# Patient Record
Sex: Female | Born: 1965 | Race: Black or African American | Hispanic: No | Smoking: Former smoker
Health system: Southern US, Community
[De-identification: ages and names within clinical notes are randomized; demographics above are authoritative.]

## PROBLEM LIST (undated history)

## (undated) ENCOUNTER — Inpatient Hospital Stay (HOSPITAL_COMMUNITY): Payer: Self-pay

## (undated) DIAGNOSIS — F329 Major depressive disorder, single episode, unspecified: Secondary | ICD-10-CM

## (undated) DIAGNOSIS — M545 Low back pain, unspecified: Secondary | ICD-10-CM

## (undated) DIAGNOSIS — K5792 Diverticulitis of intestine, part unspecified, without perforation or abscess without bleeding: Secondary | ICD-10-CM

## (undated) DIAGNOSIS — F419 Anxiety disorder, unspecified: Secondary | ICD-10-CM

## (undated) DIAGNOSIS — R569 Unspecified convulsions: Secondary | ICD-10-CM

## (undated) DIAGNOSIS — IMO0002 Reserved for concepts with insufficient information to code with codable children: Secondary | ICD-10-CM

## (undated) DIAGNOSIS — Z5189 Encounter for other specified aftercare: Secondary | ICD-10-CM

## (undated) DIAGNOSIS — E119 Type 2 diabetes mellitus without complications: Secondary | ICD-10-CM

## (undated) DIAGNOSIS — R87619 Unspecified abnormal cytological findings in specimens from cervix uteri: Secondary | ICD-10-CM

## (undated) DIAGNOSIS — K219 Gastro-esophageal reflux disease without esophagitis: Secondary | ICD-10-CM

## (undated) DIAGNOSIS — M719 Bursopathy, unspecified: Secondary | ICD-10-CM

## (undated) DIAGNOSIS — M5136 Other intervertebral disc degeneration, lumbar region: Secondary | ICD-10-CM

## (undated) DIAGNOSIS — R600 Localized edema: Secondary | ICD-10-CM

## (undated) DIAGNOSIS — M1990 Unspecified osteoarthritis, unspecified site: Secondary | ICD-10-CM

## (undated) DIAGNOSIS — IMO0001 Reserved for inherently not codable concepts without codable children: Secondary | ICD-10-CM

## (undated) DIAGNOSIS — Z931 Gastrostomy status: Secondary | ICD-10-CM

## (undated) DIAGNOSIS — F319 Bipolar disorder, unspecified: Secondary | ICD-10-CM

## (undated) DIAGNOSIS — M797 Fibromyalgia: Secondary | ICD-10-CM

## (undated) DIAGNOSIS — M5126 Other intervertebral disc displacement, lumbar region: Secondary | ICD-10-CM

## (undated) DIAGNOSIS — E739 Lactose intolerance, unspecified: Secondary | ICD-10-CM

## (undated) DIAGNOSIS — F32A Depression, unspecified: Secondary | ICD-10-CM

## (undated) DIAGNOSIS — G43909 Migraine, unspecified, not intractable, without status migrainosus: Secondary | ICD-10-CM

## (undated) DIAGNOSIS — I10 Essential (primary) hypertension: Secondary | ICD-10-CM

## (undated) DIAGNOSIS — D649 Anemia, unspecified: Secondary | ICD-10-CM

## (undated) DIAGNOSIS — E162 Hypoglycemia, unspecified: Secondary | ICD-10-CM

## (undated) DIAGNOSIS — G8929 Other chronic pain: Secondary | ICD-10-CM

## (undated) DIAGNOSIS — F1010 Alcohol abuse, uncomplicated: Secondary | ICD-10-CM

## (undated) DIAGNOSIS — M51369 Other intervertebral disc degeneration, lumbar region without mention of lumbar back pain or lower extremity pain: Secondary | ICD-10-CM

## (undated) HISTORY — DX: Essential (primary) hypertension: I10

## (undated) HISTORY — PX: DILATION AND CURETTAGE OF UTERUS: SHX78

## (undated) HISTORY — PX: CERVICAL CONE BIOPSY: SUR198

## (undated) HISTORY — DX: Encounter for other specified aftercare: Z51.89

## (undated) HISTORY — DX: Unspecified convulsions: R56.9

---

## 2005-04-26 HISTORY — PX: ROUX-EN-Y GASTRIC BYPASS: SHX1104

## 2005-12-18 ENCOUNTER — Emergency Department (HOSPITAL_COMMUNITY): Admission: EM | Admit: 2005-12-18 | Discharge: 2005-12-18 | Payer: Self-pay | Admitting: Emergency Medicine

## 2007-01-18 ENCOUNTER — Emergency Department (HOSPITAL_COMMUNITY): Admission: EM | Admit: 2007-01-18 | Discharge: 2007-01-18 | Payer: Self-pay | Admitting: Emergency Medicine

## 2007-01-22 ENCOUNTER — Inpatient Hospital Stay (HOSPITAL_COMMUNITY): Admission: EM | Admit: 2007-01-22 | Discharge: 2007-01-27 | Payer: Self-pay | Admitting: Emergency Medicine

## 2007-02-03 ENCOUNTER — Ambulatory Visit: Payer: Self-pay | Admitting: Gastroenterology

## 2007-02-14 ENCOUNTER — Ambulatory Visit: Payer: Self-pay | Admitting: Gastroenterology

## 2007-02-21 ENCOUNTER — Ambulatory Visit: Payer: Self-pay | Admitting: Gastroenterology

## 2007-03-28 ENCOUNTER — Emergency Department (HOSPITAL_COMMUNITY): Admission: EM | Admit: 2007-03-28 | Discharge: 2007-03-29 | Payer: Self-pay | Admitting: Emergency Medicine

## 2008-11-07 ENCOUNTER — Emergency Department (HOSPITAL_COMMUNITY): Admission: EM | Admit: 2008-11-07 | Discharge: 2008-11-07 | Payer: Self-pay | Admitting: Emergency Medicine

## 2009-12-30 ENCOUNTER — Emergency Department (HOSPITAL_COMMUNITY): Admission: EM | Admit: 2009-12-30 | Discharge: 2009-12-30 | Payer: Self-pay | Admitting: Family Medicine

## 2009-12-30 ENCOUNTER — Emergency Department (HOSPITAL_COMMUNITY): Admission: EM | Admit: 2009-12-30 | Discharge: 2009-12-30 | Payer: Self-pay | Admitting: Emergency Medicine

## 2010-02-09 ENCOUNTER — Emergency Department (HOSPITAL_COMMUNITY): Admission: EM | Admit: 2010-02-09 | Discharge: 2010-02-09 | Payer: Self-pay | Admitting: Emergency Medicine

## 2010-02-17 ENCOUNTER — Encounter: Admission: RE | Admit: 2010-02-17 | Discharge: 2010-02-17 | Payer: Self-pay | Admitting: Internal Medicine

## 2010-07-09 LAB — POCT PREGNANCY, URINE: Preg Test, Ur: NEGATIVE

## 2010-09-05 ENCOUNTER — Emergency Department (HOSPITAL_COMMUNITY)
Admission: EM | Admit: 2010-09-05 | Discharge: 2010-09-05 | Disposition: A | Discharge status: Home / Self Care | Payer: Medicare Other | Attending: Emergency Medicine | Admitting: Emergency Medicine

## 2010-09-05 ENCOUNTER — Emergency Department (HOSPITAL_COMMUNITY): Payer: Medicare Other

## 2010-09-05 DIAGNOSIS — R509 Fever, unspecified: Secondary | ICD-10-CM | POA: Insufficient documentation

## 2010-09-05 DIAGNOSIS — E119 Type 2 diabetes mellitus without complications: Secondary | ICD-10-CM | POA: Insufficient documentation

## 2010-09-05 DIAGNOSIS — R229 Localized swelling, mass and lump, unspecified: Secondary | ICD-10-CM | POA: Insufficient documentation

## 2010-09-05 DIAGNOSIS — I10 Essential (primary) hypertension: Secondary | ICD-10-CM | POA: Insufficient documentation

## 2010-09-05 DIAGNOSIS — R059 Cough, unspecified: Secondary | ICD-10-CM | POA: Insufficient documentation

## 2010-09-05 DIAGNOSIS — J3489 Other specified disorders of nose and nasal sinuses: Secondary | ICD-10-CM | POA: Insufficient documentation

## 2010-09-05 DIAGNOSIS — IMO0001 Reserved for inherently not codable concepts without codable children: Secondary | ICD-10-CM | POA: Insufficient documentation

## 2010-09-05 DIAGNOSIS — K1120 Sialoadenitis, unspecified: Secondary | ICD-10-CM | POA: Insufficient documentation

## 2010-09-05 DIAGNOSIS — R05 Cough: Secondary | ICD-10-CM | POA: Insufficient documentation

## 2010-09-05 LAB — CBC
HCT: 26.9 % — ABNORMAL LOW (ref 36.0–46.0)
Hemoglobin: 9.5 g/dL — ABNORMAL LOW (ref 12.0–15.0)
MCH: 31.3 pg (ref 26.0–34.0)
MCHC: 35.3 g/dL (ref 30.0–36.0)
MCV: 88.5 fL (ref 78.0–100.0)
Platelets: 306 10*3/uL (ref 150–400)
RBC: 3.04 MIL/uL — ABNORMAL LOW (ref 3.87–5.11)
RDW: 12.6 % (ref 11.5–15.5)
WBC: 8.5 10*3/uL (ref 4.0–10.5)

## 2010-09-05 LAB — DIFFERENTIAL
Basophils Absolute: 0 10*3/uL (ref 0.0–0.1)
Basophils Relative: 1 % (ref 0–1)
Eosinophils Absolute: 0 10*3/uL (ref 0.0–0.7)
Eosinophils Relative: 0 % (ref 0–5)
Lymphocytes Relative: 12 % (ref 12–46)
Lymphs Abs: 1 10*3/uL (ref 0.7–4.0)
Monocytes Absolute: 0.8 10*3/uL (ref 0.1–1.0)
Monocytes Relative: 10 % (ref 3–12)
Neutro Abs: 6.6 10*3/uL (ref 1.7–7.7)
Neutrophils Relative %: 78 % — ABNORMAL HIGH (ref 43–77)

## 2010-09-05 LAB — POCT I-STAT, CHEM 8
BUN: 11 mg/dL (ref 6–23)
Calcium, Ion: 1.11 mmol/L — ABNORMAL LOW (ref 1.12–1.32)
Chloride: 105 meq/L (ref 96–112)
Creatinine, Ser: 0.7 mg/dL (ref 0.4–1.2)
Glucose, Bld: 93 mg/dL (ref 70–99)
HCT: 28 % — ABNORMAL LOW (ref 36.0–46.0)
Hemoglobin: 9.5 g/dL — ABNORMAL LOW (ref 12.0–15.0)
Potassium: 4.9 mEq/L (ref 3.5–5.1)
Sodium: 137 mEq/L (ref 135–145)
TCO2: 26 mmol/L (ref 0–100)

## 2010-09-05 MED ORDER — IOHEXOL 300 MG/ML  SOLN: 75.0000 mL | Freq: Once | INTRAMUSCULAR | Status: DC | PRN

## 2010-09-08 NOTE — H&P (Signed)
NAME:  Angie [name], Angie [name]               ACCOUNT NO.:  [id number]   MEDICAL RECORD NO.:  [id number]          PATIENT TYPE:  INP   LOCATION:  3734                         FACILITY:  [facility]   PHYSICIAN:  Della Goo, M.D. DATE OF BIRTH:  06-Jan-1966   DATE OF ADMISSION:  01/22/2007  DATE OF DISCHARGE:                              HISTORY & PHYSICAL   PRIMARY CARE PHYSICIAN:  Dr. Lonia Blood.   CHIEF COMPLAINT:  Chest pain.   HISTORY OF PRESENT ILLNESS:  This is a 45 year old female presenting to  the emergency department with complaints of chest pain which is  substernal, rated at a 5/10, which she describes as being chest  pain/pressure and heaviness which radiates into the left shoulder.  She  reports having this pain off and on for 3 days.  When it first started,  she did present to the emergency department, however, did leave AMA at  that time.  She states that she felt the pain would possibly just go  away.  She does report the pain is associated with shortness of breath.  She reports having worsening shortness of breath and dyspnea on exertion  today.  She denies having any nausea, vomiting or diaphoresis associated  with the pain.  She denies having any fevers, chills or cough.   PAST MEDICAL HISTORY:  1. Type 2 diabetes mellitus.  2. Hypertension.  3. Anemia.  4. Diverticulosis.  5. History of cervical spine injury.  6. Right wrist and right hand pain.   PAST SURGICAL HISTORY:  Significant for C-section x3.   MEDICATIONS:  Metformin, glipizide, Actos, lisinopril, aspirin, Valium,  hydrocodone.   ALLERGIES:  She has no known drug allergies.   SOCIAL HISTORY:  The patient is married with children, nonsmoker,  nondrinker.   FAMILY HISTORY:  Positive for coronary artery disease in her father and  paternal family.  Positive for hypertension in her father, paternal  grandmother and a sister.  Positive for diabetes mellitus in her  paternal grandmother and sister and  positive for cancer in her paternal  family.   REVIEW OF SYSTEMS:  Pertinent are mentioned above.  The patient does  deny having any syncope or dizziness associated with the chest  discomfort and pain.   PHYSICAL EXAMINATION:  GENERAL:  This is an overweight 45 year old  female in discomfort, but in no acute distress.  VITAL SIGNS:  Temperature 98.9, blood pressure 128/81, heart rate 89-92,  respirations 16-20, O2 saturations 98% to 100%.  HEENT:  Normocephalic,  atraumatic.  Pupils equally round and reactive to light.  Extraocular  muscles are intact.  Funduscopic benign.  Oropharynx is clear.  NECK:  Supple with full range of motion.  No thyromegaly, adenopathy or  jugulovenous distention.  CARDIOVASCULAR:  Regular rate and rhythm.  No murmurs, gallops or rubs.  LUNGS:  Clear to auscultation bilaterally.  ABDOMEN:  Positive bowel sounds, soft, nontender and nondistended.  EXTREMITIES:  Without cyanosis, clubbing or edema.  NEUROLOGIC:  Alert and oriented x3.  There are no focal deficits.   LABORATORY STUDIES:  White blood cell count 10.3, hemoglobin 11,  hematocrit 33.1, MCV 78.8, platelets 409,000; neutrophils 71%,  lymphocytes 20%.  Sodium 133, potassium 3.6, chloride 103, bicarb 18.4,  BUN 9, creatinine 0.5 and glucose 275.  Myoglobin 40.9, CK-MB less than  1, troponin less than 0.05.  Urinalysis negative.  D-dimer less than  0.22.   ASSESSMENT:  [age]-year-old female being admitted with:  1. Substernal chest pain.  2. Hyperglycemia with type 2 diabetes mellitus.  3. Mild anemia.  4. Hypertension.   PLAN:  The patient will be admitted to the telemetry area for cardiac  monitoring and cardiac enzymes will be performed.  Her regular  medications will be verified since she does not know the dosage at this  time.  The metformin therapy will be placed on hold for now.  Sliding-  scale insulin coverage has also been ordered for elevated blood sugars  p.r.n.  The patient  will be placed on Nitrol paste, oxygen therapy and  aspirin therapy.  DVT and GI prophylaxis have also been ordered.      Della Goo, M.D.  Electronically Signed     HJ/MEDQ  D:  01/23/2007  T:  01/23/2007  Job:  [id number]   cc:   Lonia Blood, M.D.

## 2010-09-08 NOTE — Discharge Summary (Signed)
NAME:  Angie [name], Angie [name]               ACCOUNT NO.:  [id number]   MEDICAL RECORD NO.:  [id number]          PATIENT TYPE:  INP   LOCATION:  3734                         FACILITY:  [facility]   PHYSICIAN:  Isidor Holts, M.D.  DATE OF BIRTH:  1965/07/20   DATE OF ADMISSION:  01/22/2007  DATE OF DISCHARGE:  01/27/2007                               DISCHARGE SUMMARY   PRIMARY CARE PHYSICIAN:  Lonia Blood, M.D.   DISCHARGE DIAGNOSES:  1. Chest pain, status post negative stress Myoview 01/26/2007, likely      noncardiac.  2. Hypertension.  3. Type 2 diabetes mellitus.  4. Morbid obesity.  5. Dyslipidemia.  6. Iron-deficiency anemia.  7. History of diverticulosis.  8. History of cervical spine injury.   DISCHARGE MEDICATIONS:  1. Metformin 500 mg daily.  2. Glipizide 5 mg p.o. b.i.d.  3. Lantus 45 units subcutaneously q.h.s. (was previously on 20 units      subcutaneously q.h.s.).  4. Aspirin enteric coated 81 mg p.o. daily.  5. Vicodin 5/500, one to two pills p.o. p.r.n. q.4h.  6. NuIron 150 p.o. daily.   PROCEDURES:  1. Chest x-ray dated 01/21/2007.  This showed no acute abnormality.  2. Radial pharmaceutical stress testing, adenosine Myoview dated      01/26/2007.  This showed no evidence of myocardial ischemia or      infarction.  Normal left ventricular wall motion, QGS ejection      fraction 52%.   CONSULTATIONS:  1. Dr. Noel Gerold, gastroenterologist.  2. Dr. Claudette Head, gastroenterologist.  3. Dr. Tresa Endo, cardiologist.   ADMISSION HISTORY:  As in H&P note of 01/22/2007, dictated by Dr.  Della Goo. However, in brief, this is a 45 year old female, with  known history of type 2 diabetes mellitus, morbid obesity,  diverticulosis, chronic anemia, hypertension, previous history of  cervical spine injury who presents with substernal chest pain radiating  to the left shoulder, recurrent for approximately 3 days.  She was  admitted for further evaluation, investigation and  management.   CLINICAL COURSE:  1. Chest pain.  Certainly the patient has risk factors for coronary      artery disease, including hypertension, type 2 diabetes mellitus,      morbid obesity. Cardiac enzyme were cycled, remained elevated, and      12-lead EKG showed no acute ischemic changes. Cardiology      consultation was kindly provided by Dr. Tresa Endo and the patient      underwent stress Myoview on 01/26/2079 which showed no evidence of      myocardial ischemia or infarction.  The patient has been reassured      accordingly. Likely chest pain is noncardiac in origin.  She has,      however, been recommended low-dose Aspirin, given her risk factors.   1. Hypertension.  The patient has a history of hypertension, however,      pre-admission, was not on any antihypertensive medications.  Be      that as it may, she remained normotensive throughout the course of      her hospitalization.   1. Iron-deficiency anemia.  The patient has a known history of chronic      anemia.  Iron studies showed the following findings:  Iron 27 TIBC      389, percent saturation 7, B12 was 485, folate was 10.6.  As of      01/26/2007 hemoglobin was 9.4, hematocrit was 28.4, MCV was 79.8.      The patient clearly has iron-deficiency anemia. Fecal occult      testing was also positive.  GI consultation was called, which was      kindly provided Drs. Cohen and Claudette Head, who have recommended      further GI evaluation with outpatient colonoscopy.  This will be      arranged upon discharge.  Note: The patient during her      hospitalization, had 1 episode of diarrheal stool with      hematochezia.  This was deemed likely secondary to diverticulosis      or possibly hemorrhoids.  This episode was transient, responded to      MiraLax, did not recur, as noted above. Lower GI endoscopic      evaluation is planned.   1. Type 2 diabetes mellitus.  This was managed with a combination of      carbohydrate modified  diet, scheduled Lantus insulin, Glipizide and      Metformin.  The patient's Lantus dosage has been up-titrated during      the course of her hospitalization.  She is being discharged on 45      units subcutaneously q.h.s.  We shall defer further management and      titration of diabetic medications,  to the patient`s primary MD on      routine followup.  Of note, the patient's hemoglobin A1c was      elevated at 10.1.   1. Morbid obesity.  The patient's thyroid profile was done.  TSH was      normal at 3.465.  lipid profile showed the following findings:      Total cholesterol 116, triglycerides 203, HDL 28, LDL 47.   1. Known history of cervical spine injury.  The patient was on prn      analgesics for this, however, this did not prove problematic during      the course of her hospitalization.   DISPOSITION:  The patient was considered sufficiently stable to be  discharged on 01/27/2007.  She is recommended to return to regular duties  on 01/30/2007.   DIET:  Heart healthy, carbohydrate modified.   ACTIVITY:  As tolerated.   FOLLOWUP:  The patient is to follow up with Dr. Mikeal Hawthorne, her primary MD  per prior scheduled appointment.  She is also to follow up with Dr.  Claudette Head, gastroenterologist to arrange outpatient colonoscopy, at  the date to be determined. She has been supplied with appropriate  information.      Isidor Holts, M.D.  Electronically Signed     CO/MEDQ  D:  01/27/2007  T:  01/27/2007  Job:  [id number]   cc:   Lonia Blood, M.D.  Venita Lick. Russella Dar, MD, Andi Hence, M.D.

## 2010-09-30 ENCOUNTER — Encounter: Payer: Self-pay | Admitting: Advanced Practice Midwife

## 2011-02-01 LAB — CBC
HCT: 29.2 — ABNORMAL LOW
Hemoglobin: 9.8 — ABNORMAL LOW
MCHC: 33.7
MCV: 78.8
Platelets: 414 — ABNORMAL HIGH
RBC: 3.71 — ABNORMAL LOW
RDW: 14.6
WBC: 10.9 — ABNORMAL HIGH

## 2011-02-01 LAB — DIFFERENTIAL
Basophils Absolute: 0
Basophils Relative: 0
Eosinophils Absolute: 0.1 — ABNORMAL LOW
Eosinophils Relative: 1
Lymphocytes Relative: 12
Lymphs Abs: 1.3
Monocytes Absolute: 1
Monocytes Relative: 9
Neutro Abs: 8.5 — ABNORMAL HIGH
Neutrophils Relative %: 78 — ABNORMAL HIGH

## 2011-02-01 LAB — POCT I-STAT CREATININE
Creatinine, Ser: 0.5
Operator id: 257131

## 2011-02-01 LAB — I-STAT 8, (EC8 V) (CONVERTED LAB)
Acid-base deficit: 4 — ABNORMAL HIGH
BUN: 4 — ABNORMAL LOW
Bicarbonate: 20.2
Chloride: 105
Glucose, Bld: 204 — ABNORMAL HIGH
HCT: 34 — ABNORMAL LOW
Hemoglobin: 11.6 — ABNORMAL LOW
Operator id: 257131
Potassium: 4
Sodium: 136
TCO2: 21
pCO2, Ven: 31.9 — ABNORMAL LOW
pH, Ven: 7.41 — ABNORMAL HIGH

## 2011-02-01 LAB — URINALYSIS, ROUTINE W REFLEX MICROSCOPIC
Bilirubin Urine: NEGATIVE
Glucose, UA: NEGATIVE
Hgb urine dipstick: NEGATIVE
Ketones, ur: NEGATIVE
Nitrite: NEGATIVE
Protein, ur: NEGATIVE
Specific Gravity, Urine: 1.009
Urobilinogen, UA: 0.2
pH: 7.5

## 2011-02-01 LAB — INFLUENZA A+B VIRUS AG-DIRECT(RAPID)
Inflenza A Ag: NEGATIVE
Influenza B Ag: NEGATIVE

## 2011-02-04 LAB — CBC
HCT: 28.4 — ABNORMAL LOW
HCT: 30.1 — ABNORMAL LOW
HCT: 30 — ABNORMAL LOW
HCT: 33.1 — ABNORMAL LOW
HCT: 35 — ABNORMAL LOW
Hemoglobin: 10 — ABNORMAL LOW
Hemoglobin: 9.4 — ABNORMAL LOW
Hemoglobin: 10 — ABNORMAL LOW
Hemoglobin: 11 — ABNORMAL LOW
Hemoglobin: 11.7 — ABNORMAL LOW
MCHC: 33.2
MCHC: 33.2
MCHC: 33.3
MCHC: 33.5
MCHC: 33.5
MCV: 79.8
MCV: 80.3
MCV: 77.3 — ABNORMAL LOW
MCV: 78.8
MCV: 79.1
Platelets: 373
Platelets: 388
Platelets: 387
Platelets: 409 — ABNORMAL HIGH
Platelets: 454 — ABNORMAL HIGH
RBC: 3.56 — ABNORMAL LOW
RBC: 3.74 — ABNORMAL LOW
RBC: 3.79 — ABNORMAL LOW
RBC: 4.2
RBC: 4.53
RDW: 15.7 — ABNORMAL HIGH
RDW: 15.8 — ABNORMAL HIGH
RDW: 15.6 — ABNORMAL HIGH
RDW: 15.6 — ABNORMAL HIGH
RDW: 15.7 — ABNORMAL HIGH
WBC: 6.5
WBC: 8.3
WBC: 10.3
WBC: 6.9
WBC: 9

## 2011-02-04 LAB — I-STAT 8, (EC8 V) (CONVERTED LAB)
Acid-base deficit: 6 — ABNORMAL HIGH
Acid-base deficit: 6 — ABNORMAL HIGH
BUN: 9
BUN: 9
Bicarbonate: 18.4 — ABNORMAL LOW
Bicarbonate: 18.5 — ABNORMAL LOW
Chloride: 103
Chloride: 104
Glucose, Bld: 275 — ABNORMAL HIGH
Glucose, Bld: 329 — ABNORMAL HIGH
HCT: 38
HCT: 41
Hemoglobin: 12.9
Hemoglobin: 13.9
Operator id: 151321
Operator id: 277751
Potassium: 3.6
Potassium: 4
Sodium: 133 — ABNORMAL LOW
Sodium: 133 — ABNORMAL LOW
TCO2: 19
TCO2: 19
pCO2, Ven: 31.7 — ABNORMAL LOW
pCO2, Ven: 32.4 — ABNORMAL LOW
pH, Ven: 7.364 — ABNORMAL HIGH
pH, Ven: 7.373 — ABNORMAL HIGH

## 2011-02-04 LAB — BASIC METABOLIC PANEL
BUN: 5 — ABNORMAL LOW
BUN: 6
BUN: 7
CO2: 22
CO2: 24
CO2: 17 — ABNORMAL LOW
Calcium: 8.9
Calcium: 9
Calcium: 8.7
Chloride: 104
Chloride: 105
Chloride: 104
Creatinine, Ser: 0.67
Creatinine, Ser: 0.88
Creatinine, Ser: 0.77
GFR calc Af Amer: >60
GFR calc Af Amer: >60
GFR calc Af Amer: >60
GFR calc non Af Amer: >60
GFR calc non Af Amer: >60
GFR calc non Af Amer: >60
Glucose, Bld: 243 — ABNORMAL HIGH
Glucose, Bld: 280 — ABNORMAL HIGH
Glucose, Bld: 317 — ABNORMAL HIGH
Potassium: 4
Potassium: 4.4
Potassium: 3.4 — ABNORMAL LOW
Sodium: 136
Sodium: 136
Sodium: 132 — ABNORMAL LOW

## 2011-02-04 LAB — DIFFERENTIAL
Basophils Absolute: 0
Basophils Absolute: 0.1
Basophils Relative: 0
Basophils Relative: 1
Eosinophils Absolute: 0.1
Eosinophils Absolute: 0.1
Eosinophils Relative: 1
Eosinophils Relative: 1
Lymphocytes Relative: 18
Lymphocytes Relative: 20
Lymphs Abs: 1.6
Lymphs Abs: 2.1
Monocytes Absolute: 0.7
Monocytes Absolute: 0.7
Monocytes Relative: 7
Monocytes Relative: 8
Neutro Abs: 6.5
Neutro Abs: 7.3
Neutrophils Relative %: 71
Neutrophils Relative %: 73

## 2011-02-04 LAB — IRON AND TIBC
Iron: 27 — ABNORMAL LOW
Saturation Ratios: 7 — ABNORMAL LOW
TIBC: 389
UIBC: 362

## 2011-02-04 LAB — POCT I-STAT CREATININE
Creatinine, Ser: 0.5
Creatinine, Ser: 0.6
Operator id: 151321
Operator id: 277751

## 2011-02-04 LAB — URINALYSIS, ROUTINE W REFLEX MICROSCOPIC
Bilirubin Urine: NEGATIVE
Glucose, UA: 100 — AB
Hgb urine dipstick: NEGATIVE
Ketones, ur: 15 — AB
Nitrite: NEGATIVE
Protein, ur: NEGATIVE
Specific Gravity, Urine: 1.011
Urobilinogen, UA: 0.2
pH: 6.5

## 2011-02-04 LAB — POCT CARDIAC MARKERS
CKMB, poc: <1 — ABNORMAL LOW
CKMB, poc: <1 — ABNORMAL LOW
CKMB, poc: <1 — ABNORMAL LOW
Myoglobin, poc: 37.6
Myoglobin, poc: 40.9
Myoglobin, poc: 47.1
Operator id: 151321
Operator id: 277751
Operator id: 277751
Troponin i, poc: <0.05
Troponin i, poc: <0.05
Troponin i, poc: <0.05

## 2011-02-04 LAB — OCCULT BLOOD X 1 CARD TO LAB, STOOL
Fecal Occult Bld: POSITIVE
Fecal Occult Bld: POSITIVE

## 2011-02-04 LAB — LIPID PANEL
Cholesterol: 116
HDL: 28 — ABNORMAL LOW
LDL Cholesterol: 47
Total CHOL/HDL Ratio: 4.1
Triglycerides: 203 — ABNORMAL HIGH
VLDL: 41 — ABNORMAL HIGH

## 2011-02-04 LAB — CARDIAC PANEL(CRET KIN+CKTOT+MB+TROPI)
CK, MB: 0.7
CK, MB: 0.7
Relative Index: 0.6
Relative Index: 0.7
Total CK: 105
Total CK: 108
Troponin I: 0.01
Troponin I: 0.01

## 2011-02-04 LAB — FERRITIN: Ferritin: 26 (ref 10–291)

## 2011-02-04 LAB — TSH: TSH: 3.465

## 2011-02-04 LAB — D-DIMER, QUANTITATIVE
D-Dimer, Quant: <0.22
D-Dimer, Quant: <0.22

## 2011-02-04 LAB — HEMOGLOBIN A1C
Hgb A1c MFr Bld: 10.1 — ABNORMAL HIGH
Mean Plasma Glucose: 282

## 2011-02-04 LAB — CK TOTAL AND CKMB (NOT AT ARMC)
CK, MB: 0.6
Relative Index: 0.6
Total CK: 108

## 2011-02-04 LAB — VITAMIN B12: Vitamin B-12: 485 (ref 211–911)

## 2011-02-04 LAB — RETICULOCYTES
RBC.: 4
Retic Count, Absolute: 72
Retic Ct Pct: 1.8

## 2011-02-04 LAB — POCT PREGNANCY, URINE
Operator id: 151321
Preg Test, Ur: NEGATIVE

## 2011-02-04 LAB — FOLATE: Folate: 10.6

## 2011-02-04 LAB — TROPONIN I: Troponin I: 0.01

## 2011-04-30 ENCOUNTER — Encounter: Payer: Self-pay | Admitting: *Deleted

## 2011-04-30 DIAGNOSIS — R079 Chest pain, unspecified: Secondary | ICD-10-CM | POA: Insufficient documentation

## 2011-04-30 DIAGNOSIS — M549 Dorsalgia, unspecified: Secondary | ICD-10-CM | POA: Insufficient documentation

## 2011-04-30 NOTE — ED Notes (Signed)
Pt comes from home where she c/o chronic back pain from sciatica, a bulging disk (causing left side pain from low back to mid ham string), a productive cough (multicolored), and right arm pain.  Chest pain is reproducable upon palpation and deep inspiration.

## 2011-05-01 ENCOUNTER — Emergency Department (HOSPITAL_COMMUNITY)
Admission: EM | Admit: 2011-05-01 | Discharge: 2011-05-01 | Discharge status: Against Medical Advice | Payer: Medicare Other | Attending: Emergency Medicine | Admitting: Emergency Medicine

## 2011-05-01 HISTORY — DX: Unspecified osteoarthritis, unspecified site: M19.90

## 2011-05-01 HISTORY — DX: Anxiety disorder, unspecified: F41.9

## 2012-04-26 HISTORY — PX: TUBAL LIGATION: SHX77

## 2012-05-16 ENCOUNTER — Inpatient Hospital Stay (HOSPITAL_COMMUNITY)
Admission: AD | Admit: 2012-05-16 | Discharge: 2012-05-16 | Disposition: A | Discharge status: Home / Self Care | Payer: Medicare HMO | Source: Ambulatory Visit | Attending: Obstetrics and Gynecology | Admitting: Obstetrics and Gynecology

## 2012-05-16 ENCOUNTER — Inpatient Hospital Stay (HOSPITAL_COMMUNITY): Payer: Medicare HMO

## 2012-05-16 ENCOUNTER — Encounter (HOSPITAL_COMMUNITY): Payer: Self-pay | Admitting: *Deleted

## 2012-05-16 DIAGNOSIS — O09529 Supervision of elderly multigravida, unspecified trimester: Secondary | ICD-10-CM

## 2012-05-16 DIAGNOSIS — M1990 Unspecified osteoarthritis, unspecified site: Secondary | ICD-10-CM

## 2012-05-16 DIAGNOSIS — R109 Unspecified abdominal pain: Secondary | ICD-10-CM | POA: Insufficient documentation

## 2012-05-16 DIAGNOSIS — A499 Bacterial infection, unspecified: Secondary | ICD-10-CM | POA: Insufficient documentation

## 2012-05-16 DIAGNOSIS — O0930 Supervision of pregnancy with insufficient antenatal care, unspecified trimester: Secondary | ICD-10-CM

## 2012-05-16 DIAGNOSIS — B9689 Other specified bacterial agents as the cause of diseases classified elsewhere: Secondary | ICD-10-CM | POA: Insufficient documentation

## 2012-05-16 DIAGNOSIS — N949 Unspecified condition associated with female genital organs and menstrual cycle: Secondary | ICD-10-CM

## 2012-05-16 DIAGNOSIS — N760 Acute vaginitis: Secondary | ICD-10-CM | POA: Insufficient documentation

## 2012-05-16 DIAGNOSIS — O26859 Spotting complicating pregnancy, unspecified trimester: Secondary | ICD-10-CM | POA: Insufficient documentation

## 2012-05-16 DIAGNOSIS — Z9884 Bariatric surgery status: Secondary | ICD-10-CM

## 2012-05-16 DIAGNOSIS — O2390 Unspecified genitourinary tract infection in pregnancy, unspecified trimester: Secondary | ICD-10-CM | POA: Insufficient documentation

## 2012-05-16 DIAGNOSIS — Z98891 History of uterine scar from previous surgery: Secondary | ICD-10-CM

## 2012-05-16 HISTORY — DX: Other intervertebral disc degeneration, lumbar region: M51.36

## 2012-05-16 HISTORY — DX: Other intervertebral disc degeneration, lumbar region without mention of lumbar back pain or lower extremity pain: M51.369

## 2012-05-16 HISTORY — DX: Migraine, unspecified, not intractable, without status migrainosus: G43.909

## 2012-05-16 HISTORY — DX: Other intervertebral disc displacement, lumbar region: M51.26

## 2012-05-16 HISTORY — DX: Unspecified abnormal cytological findings in specimens from cervix uteri: R87.619

## 2012-05-16 HISTORY — DX: Reserved for concepts with insufficient information to code with codable children: IMO0002

## 2012-05-16 LAB — WET PREP, GENITAL
Trich, Wet Prep: NONE SEEN
Yeast Wet Prep HPF POC: NONE SEEN

## 2012-05-16 LAB — URINALYSIS, ROUTINE W REFLEX MICROSCOPIC
Bilirubin Urine: NEGATIVE
Glucose, UA: NEGATIVE mg/dL
Hgb urine dipstick: NEGATIVE
Ketones, ur: NEGATIVE mg/dL
Leukocytes, UA: NEGATIVE
Nitrite: NEGATIVE
Protein, ur: NEGATIVE mg/dL
Specific Gravity, Urine: 1.025 (ref 1.005–1.030)
Urobilinogen, UA: 0.2 mg/dL (ref 0.0–1.0)
pH: 6 (ref 5.0–8.0)

## 2012-05-16 MED ORDER — PRENATAL PLUS 27-1 MG PO TABS: 1.0000 | ORAL_TABLET | Freq: Every day | ORAL | Status: DC

## 2012-05-16 MED ORDER — METRONIDAZOLE 500 MG PO TABS: 500.0000 mg | ORAL_TABLET | Freq: Three times a day (TID) | ORAL | Status: DC

## 2012-05-16 MED ORDER — ACETAMINOPHEN 500 MG PO TABS
1000.0000 mg | ORAL_TABLET | Freq: Once | ORAL | Status: AC
  Administered 2012-05-16: 1000 mg via ORAL
  Filled 2012-05-16: qty 2

## 2012-05-16 NOTE — Discharge Instructions (Signed)
 Bacterial Vaginosis Bacterial vaginosis (BV) is a vaginal infection where the normal balance of bacteria in the vagina is disrupted. The normal balance is then replaced by an overgrowth of certain bacteria. There are several different kinds of bacteria that can cause BV. BV is the most common vaginal infection in women of childbearing age. CAUSES   The cause of BV is not fully understood. BV develops when there is an increase or imbalance of harmful bacteria.  Some activities or behaviors can upset the normal balance of bacteria in the vagina and put women at increased risk including:  Having a new sex partner or multiple sex partners.  Douching.  Using an intrauterine device (IUD) for contraception.  It is not clear what role sexual activity plays in the development of BV. However, women that have never had sexual intercourse are rarely infected with BV. Women do not get BV from toilet seats, bedding, swimming pools or from touching objects around them.  SYMPTOMS   Grey vaginal discharge.  A fish-like odor with discharge, especially after sexual intercourse.  Itching or burning of the vagina and vulva.  Burning or pain with urination.  Some women have no signs or symptoms at all. DIAGNOSIS  Your caregiver must examine the vagina for signs of BV. Your caregiver will perform lab tests and look at the sample of vaginal fluid through a microscope. They will look for bacteria and abnormal cells (clue cells), a pH test higher than 4.5, and a positive amine test all associated with BV.  RISKS AND COMPLICATIONS   Pelvic inflammatory disease (PID).  Infections following gynecology surgery.  Developing HIV.  Developing herpes virus. TREATMENT  Sometimes BV will clear up without treatment. However, all women with symptoms of BV should be treated to avoid complications, especially if gynecology surgery is planned. Female partners generally do not need to be treated. However, BV may spread  between female sex partners so treatment is helpful in preventing a recurrence of BV.   BV may be treated with antibiotics. The antibiotics come in either pill or vaginal cream forms. Either can be used with nonpregnant or pregnant women, but the recommended dosages differ. These antibiotics are not harmful to the baby.  BV can recur after treatment. If this happens, a second round of antibiotics will often be prescribed.  Treatment is important for pregnant women. If not treated, BV can cause a premature delivery, especially for a pregnant woman who had a premature birth in the past. All pregnant women who have symptoms of BV should be checked and treated.  For chronic reoccurrence of BV, treatment with a type of prescribed gel vaginally twice a week is helpful. HOME CARE INSTRUCTIONS   Finish all medication as directed by your caregiver.  Do not have sex until treatment is completed.  Tell your sexual partner that you have a vaginal infection. They should see their caregiver and be treated if they have problems, such as a mild rash or itching.  Practice safe sex. Use condoms. Only have 1 sex partner. PREVENTION  Basic prevention steps can help reduce the risk of upsetting the natural balance of bacteria in the vagina and developing BV:  Do not have sexual intercourse (be abstinent).  Do not douche.  Use all of the medicine prescribed for treatment of BV, even if the signs and symptoms go away.  Tell your sex partner if you have BV. That way, they can be treated, if needed, to prevent reoccurrence. SEEK MEDICAL CARE IF:  Your symptoms are not improving after 3 days of treatment.  You have increased discharge, pain, or fever. MAKE SURE YOU:   Understand these instructions.  Will watch your condition.  Will get help right away if you are not doing well or get worse. FOR MORE INFORMATION  Division of STD Prevention (DSTDP), Centers for Disease Control and Prevention:  SolutionApps.[employer].za American Social Health Association (ASHA): www.ashastd.org  Document Released: 04/12/2005 Document Revised: 07/05/2011 Document Reviewed: 10/03/2008 [facility] Patient Information 2013 [place], [place]. Round Ligament Pain The round ligament is made up of muscle and fibrous tissue. It is attached to the uterus near the fallopian tube. The round ligament is located on both sides of the uterus and helps support the position of the uterus. It usually begins in the second trimester of pregnancy when the uterus comes out of the pelvis. The pain can come and go until the baby is delivered. Round ligament pain is not a serious problem and does not cause harm to the baby. CAUSE During pregnancy the uterus grows the most from the second trimester to delivery. As it grows, it stretches and slightly twists the round ligaments. When the uterus leans from one side to the other, the round ligament on the opposite side pulls and stretches. This can cause pain. SYMPTOMS  Pain can occur on one side or both sides. The pain is usually a short, sharp, and pinching-like. Sometimes it can be a dull, lingering and aching pain. The pain is located in the lower side of the abdomen or in the groin. The pain is internal and usually starts deep in the groin and moves up to the outside of the hip area. Pain can occur with: Sudden change in position like getting out of bed or a chair. Rolling over in bed. Coughing or sneezing. Walking too much. Any type of physical activity. DIAGNOSIS  Your caregiver will make sure there are no serious problems causing the pain. When nothing serious is found, the symptoms usually indicate that the pain is from the round ligament. TREATMENT  Sit down and relax when the pain starts. Flex your knees up to your belly. Lay on your side with a pillow under your belly (abdomen) and another one between your legs. Sit in a hot bath for 15 to 20 minutes or until the pain goes away. HOME  CARE INSTRUCTIONS  Only take over-the-counter or prescriptions medicines for pain, discomfort or fever as directed by your caregiver. Sit and stand slowly. Avoid long walks if it causes pain. Stop or lessen your physical activities if it causes pain. SEEK MEDICAL CARE IF:  The pain does not go away with any of your treatment. You need stronger medication for the pain. You develop back pain that you did not have before with the side pain. SEEK IMMEDIATE MEDICAL CARE IF:  You develop a temperature of 102 F (38.9 C) or higher. You develop uterine contractions. You develop vaginal bleeding. You develop nausea, vomiting or diarrhea. You develop chills. You have pain when you urinate. Document Released: 01/20/2008 Document Revised: 07/05/2011 Document Reviewed: 01/20/2008 [facility] Patient Information 2013 [place], [place].

## 2012-05-16 NOTE — MAU Provider Note (Signed)
Chief Complaint: Possible Pregnancy, Vaginal Discharge and Abdominal Pain   First Provider Initiated Contact with Patient 05/16/12 0837     SUBJECTIVE HPI: Angie [name] is a 47 y.o. [id number] at [redacted]w[redacted]d by LMP who presents with hx of pink and watery vaginal discharge 2 days ago noted with wiping, none since. Denies antecedent intercourse or irritative vaginal discharge. No spotting or bleeding earlier in pregnnacy.  NPC and unsure LMP, wants pregnancy confirmation. Also has bilateral groin pain with walking, not at rest. Also began having H/A while here.   Past Medical History  Diagnosis Date  . Chronic pain   . Bulging lumbar disc   . Abnormal Pap smear   . Anxiety     Takes xanax  . Arthritis     Has rx for percocet   OB History    Grav Para Term Preterm Abortions TAB SAB Ect Mult Living   6 3 3  0 2  2   3      # Outc Date GA Lbr Len/2nd Wgt Sex Del Anes PTL Lv   1 SAB            2 SAB            3 TRM            4 TRM            5 TRM            6 CUR              Past Surgical History  Procedure Date  . Cesarean section     X 3  . Cervical cone biopsy    History   Social History  . Marital Status: Widowed    Spouse Name: N/A    Number of Children: N/A  . Years of Education: N/A   Occupational History  . Not on file.   Social History Main Topics  . Smoking status: Former Smoker -- 0.5 packs/day for 10 years    Types: Cigarettes    Quit date: 03/16/2012  . Smokeless tobacco: Never Used  . Alcohol Use: 5.4 oz/week    9 Glasses of wine per week     Comment: Had "issues" then socially, now none  . Drug Use: No  . Sexually Active: Yes    Birth Control/ Protection: Coitus interruptus   Other Topics Concern  . Not on file   Social History Narrative  . No narrative on file   No current facility-administered medications on file prior to encounter.   Current Outpatient Prescriptions on File Prior to Encounter  Medication Sig Dispense Refill  . ferrous  gluconate (FERGON) 325 MG tablet Take 325 mg by mouth 3 (three) times daily with meals.       No Known Allergies  ROS: Pertinent items in HPI  OBJECTIVE Blood pressure 107/72, pulse 70, temperature 98.1 F (36.7 C), temperature source Oral, resp. rate 16, height 5\' 1"  (1.549 m), weight 163 lb (73.936 kg), last menstrual period 01/28/2012, SpO2 97.00%. GENERAL: Well-developed, well-nourished female in no acute distress.  HEENT: Normocephalic HEART: normal rate RESP: normal effort ABDOMEN: Soft, non-tender EXTREMITIES: Nontender, no edema NEURO: Alert and oriented SPECULUM EXAM: NEFG, gray-white thin discharge, no blood noted, cervix clean BIMANUAL: cervix closed, thick; uterus 16 w size, no adnexal tenderness or masses  LAB RESULTS Results for orders placed during the hospital encounter of 05/16/12 (from the past 24 hour(s))  URINALYSIS, ROUTINE W REFLEX MICROSCOPIC  Status: Normal   Collection Time   05/16/12  7:58 AM      Component Value Range   Color, Urine YELLOW  YELLOW   APPearance CLEAR  CLEAR   Specific Gravity, Urine 1.025  1.005 - 1.030   pH 6.0  5.0 - 8.0   Glucose, UA NEGATIVE  NEGATIVE mg/dL   Hgb urine dipstick NEGATIVE  NEGATIVE   Bilirubin Urine NEGATIVE  NEGATIVE   Ketones, ur NEGATIVE  NEGATIVE mg/dL   Protein, ur NEGATIVE  NEGATIVE mg/dL   Urobilinogen, UA 0.2  0.0 - 1.0 mg/dL   Nitrite NEGATIVE  NEGATIVE   Leukocytes, UA NEGATIVE  NEGATIVE  WET PREP, GENITAL     Status: Abnormal   Collection Time   05/16/12  8:45 AM      Component Value Range   Yeast Wet Prep HPF POC NONE SEEN  NONE SEEN   Trich, Wet Prep NONE SEEN  NONE SEEN   Clue Cells Wet Prep HPF POC MODERATE (*) NONE SEEN   WBC, Wet Prep HPF POC FEW (*) NONE SEEN    IMAGING [place]: all normal, dates confirmed   MAU COURSE Acetaminophen 1000 po with relief of H/A GC/CT sent  ASSESSMENT 1. BV (bacterial vaginosis)   2. Round ligament pain   47 yo at 15w 4d with hx 2nd tri  spotting  PLAN Discharge home Refer to [facility] in time to get genetic screen See AVS Follow-up Information    Follow up with [facility]. (Someone from clinic will callyou with appointment)           Medication List     As of 05/16/2012 12:00 PM    STOP taking these medications         ALPRAZolam 1 MG tablet   Commonly known as: XANAX      oxyCODONE-acetaminophen 10-325 MG per tablet   Commonly known as: PERCOCET      TAKE these medications         BIOTIN PO   Take 1 tablet by mouth daily.      cholecalciferol 1000 UNITS tablet   Commonly known as: VITAMIN D   Take 2,000 Units by mouth daily.      ferrous gluconate 325 MG tablet   Commonly known as: FERGON   Take 325 mg by mouth 3 (three) times daily with meals.      MAGNESIA PO   Take 1 tablet by mouth daily.      metroNIDAZOLE 500 MG tablet   Commonly known as: FLAGYL   Take 1 tablet (500 mg total) by mouth 3 (three) times daily.      prenatal vitamin w/FE, FA 27-1 MG Tabs   Take 1 tablet by mouth daily.      VITAMIN B-12 PO   Take 1 tablet by mouth 2 (two) times daily.           Danae Orleans, CNM 05/16/2012  8:38 AM

## 2012-05-16 NOTE — MAU Note (Signed)
Patient states she had a last period in October that was not normal. Has been feeling movement, having a vaginal discharge and having left lower abdominal pain.

## 2012-05-17 ENCOUNTER — Encounter: Payer: Medicare Other | Admitting: Obstetrics and Gynecology

## 2012-05-17 LAB — GC/CHLAMYDIA PROBE AMP
CT Probe RNA: NEGATIVE
GC Probe RNA: NEGATIVE

## 2012-05-17 NOTE — MAU Provider Note (Signed)
Attestation of Attending Supervision of Advanced Practitioner (CNM/NP): Evaluation and management procedures were performed by the Advanced Practitioner under my supervision and collaboration.  I have reviewed the Advanced Practitioner's note and chart, and I agree with the management and plan.  Dezaray Shibuya 05/17/2012 9:20 AM

## 2012-05-22 ENCOUNTER — Ambulatory Visit (INDEPENDENT_AMBULATORY_CARE_PROVIDER_SITE_OTHER): Payer: Medicare HMO | Admitting: Obstetrics & Gynecology

## 2012-05-22 ENCOUNTER — Encounter: Payer: Self-pay | Admitting: Obstetrics & Gynecology

## 2012-05-22 VITALS — BP 130/74 | Temp 98.0°F | Ht 60.0 in | Wt 165.4 lb

## 2012-05-22 DIAGNOSIS — O0930 Supervision of pregnancy with insufficient antenatal care, unspecified trimester: Secondary | ICD-10-CM

## 2012-05-22 DIAGNOSIS — O0990 Supervision of high risk pregnancy, unspecified, unspecified trimester: Secondary | ICD-10-CM

## 2012-05-22 DIAGNOSIS — O09529 Supervision of elderly multigravida, unspecified trimester: Secondary | ICD-10-CM

## 2012-05-22 LAB — POCT URINALYSIS DIP (DEVICE)
Bilirubin Urine: NEGATIVE
Glucose, UA: NEGATIVE mg/dL
Hgb urine dipstick: NEGATIVE
Ketones, ur: NEGATIVE mg/dL
Leukocytes, UA: NEGATIVE
Nitrite: NEGATIVE
Protein, ur: NEGATIVE mg/dL
Specific Gravity, Urine: 1.02 (ref 1.005–1.030)
Urobilinogen, UA: 1 mg/dL (ref 0.0–1.0)
pH: 7 (ref 5.0–8.0)

## 2012-05-22 LAB — HIV ANTIBODY (ROUTINE TESTING W REFLEX): HIV: NONREACTIVE

## 2012-05-22 NOTE — Progress Notes (Signed)
Nutrition note: 1st visit consult Pt has h/o gastric bypass in 2002 & had GDM in her 1st two pregnancies but not her 3rd. Pt has gained 15.4# @ [redacted]w[redacted]d, which is > recommended. Pt reports eating 6x/d. Pt reports drinking 3-6c of sweet tea/d. Pt reports taking a multivitamin, magnesium, biotin, iron, B12 & a vitamin D supplement. Pt reports having occ nausea.  Pt received verbal & written education on general nutrition during pregnancy. Encouraged pt to decrease sweet tea to no more than 1-2c/d.  Disc wt gain goals of 15-25# or 0.6#/wk. Pt agrees to discuss PNV vs. multivitamin with her PCP & work on decreasing sweet tea. Pt does not receive WIC but plans to apply. Pt plans to BF. F/u if referred Blondell Reveal, MS, RD, LDN

## 2012-05-22 NOTE — Progress Notes (Signed)
Would like female provider for pelvic exam, will do at next visit. Has TENS unit for back pain, would be open to see chiro or acupunture  Subjective:    Angie [name] is a [id number] [redacted]w[redacted]d being seen today for her first obstetrical visit.  Her obstetrical history is significant for advanced maternal age, obesity and previous gastric bypass surgery. Patient does intend to breast feed. Pregnancy history fully reviewed.  Patient reports backache.  Filed Vitals:   05/22/12 0915 05/22/12 1017  BP: 130/74   Temp: 98 F (36.7 C)   Height:  5' (1.524 m)  Weight: 165 lb 6.4 oz (75.025 kg)     HISTORY: OB History    Grav Para Term Preterm Abortions TAB SAB Ect Mult Living   6 3 3  0 2  2   3      # Outc Date GA Lbr Len/2nd Wgt Sex Del Anes PTL Lv   1 SAB            2 SAB            3 TRM      LVCS      4 TRM      LVCS      5 TRM      LVCS      6 CUR              Past Medical History  Diagnosis Date  . Chronic pain   . Bulging lumbar disc   . Abnormal Pap smear   . Anxiety     Takes xanax  . Arthritis     Has rx for percocet  . Migraines    Past Surgical History  Procedure Date  . Cesarean section     X 3  . Cervical cone biopsy    Family History  Problem Relation Age of Onset  . Diabetes Father   . Depression Maternal Grandmother   . Heart disease Maternal Grandfather   . Depression Paternal Grandmother      Exam    Uterus:  Fundal Height: 20 cm  Pelvic Exam:    Perineum:                           System:     Skin: normal coloration and turgor, no rashes    Neurologic: oriented, normal   Extremities: normal strength, tone, and muscle mass   HEENT oropharynx clear, no lesions, thyroid without masses and trachea midline   Mouth/Teeth mucous membranes moist, pharynx normal without lesions and dental hygiene good   Neck supple and no masses   Cardiovascular: regular rate and rhythm, no murmurs or gallops   Respiratory:  appears well, vitals normal, no  respiratory distress, acyanotic, normal RR, neck free of mass or lymphadenopathy, chest clear, no wheezing, crepitations, rhonchi, normal symmetric air entry   Abdomen: soft, non-tender; bowel sounds normal; no masses,  no organomegaly          Assessment:    Pregnancy: [id number] Patient Active Problem List  Diagnosis  . AMA (advanced maternal age) multigravida 35+  . Insufficient prenatal care  . Previous cesarean section  . Arthritis  . S/P gastric bypass        Plan:     Initial labs drawn. Prenatal vitamins. Problem list reviewed and updated. Genetic Screening discussed Quad Screen: requested.MFM genetic counseling  Ultrasound discussed; fetal survey: requested.  Follow up in 4 weeks. 50% of 30  min visit spent on counseling and coordination of care.  Dietician consult Requests female provider for breast and pelvic exam   Angie [name] 05/22/2012

## 2012-05-22 NOTE — Patient Instructions (Signed)

## 2012-05-22 NOTE — Progress Notes (Signed)
The patient has a hx of diabetes but was able to control it through weight loss.  She had gestational diabetes with her first 2 pregnancies. Patient complains of terrible pain on right side.  Pt. States the pain is usually an 8-9.

## 2012-05-23 LAB — GLUCOSE TOLERANCE, 1 HOUR (50G) W/O FASTING: Glucose, 1 Hour GTT: 110 mg/dL (ref 70–140)

## 2012-05-24 LAB — OBSTETRIC PANEL
Antibody Screen: NEGATIVE
Basophils Absolute: 0 10*3/uL (ref 0.0–0.1)
Basophils Relative: 0 % (ref 0–1)
Eosinophils Absolute: 0 10*3/uL (ref 0.0–0.7)
Eosinophils Relative: 0 % (ref 0–5)
HCT: 27.7 % — ABNORMAL LOW (ref 36.0–46.0)
Hemoglobin: 9.4 g/dL — ABNORMAL LOW (ref 12.0–15.0)
Hepatitis B Surface Ag: NEGATIVE
Lymphocytes Relative: 19 % (ref 12–46)
Lymphs Abs: 0.9 10*3/uL (ref 0.7–4.0)
MCH: 29.1 pg (ref 26.0–34.0)
MCHC: 33.9 g/dL (ref 30.0–36.0)
MCV: 85.8 fL (ref 78.0–100.0)
Monocytes Absolute: 0.5 10*3/uL (ref 0.1–1.0)
Monocytes Relative: 10 % (ref 3–12)
Neutro Abs: 3.1 10*3/uL (ref 1.7–7.7)
Neutrophils Relative %: 71 % (ref 43–77)
Platelets: 301 10*3/uL (ref 150–400)
RBC: 3.23 MIL/uL — ABNORMAL LOW (ref 3.87–5.11)
RDW: 14.4 % (ref 11.5–15.5)
Rh Type: POSITIVE
Rubella: 1.4 {index} — ABNORMAL HIGH (ref <0.90)
WBC: 4.5 10*3/uL (ref 4.0–10.5)

## 2012-05-24 LAB — HEMOGLOBINOPATHY EVALUATION
Hemoglobin Other: 0 %
Hgb A2 Quant: 2.8 % (ref 2.2–3.2)
Hgb A: 96.7 % — ABNORMAL LOW (ref 96.8–97.8)
Hgb F Quant: 0.5 % (ref 0.0–2.0)
Hgb S Quant: 0 %

## 2012-05-29 ENCOUNTER — Encounter: Payer: Self-pay | Admitting: *Deleted

## 2012-06-12 ENCOUNTER — Other Ambulatory Visit: Payer: Self-pay | Admitting: Obstetrics & Gynecology

## 2012-06-12 ENCOUNTER — Ambulatory Visit (HOSPITAL_COMMUNITY)
Admission: RE | Admit: 2012-06-12 | Discharge: 2012-06-12 | Disposition: A | Discharge status: Home / Self Care | Payer: Medicare HMO | Source: Ambulatory Visit | Attending: Obstetrics & Gynecology | Admitting: Obstetrics & Gynecology

## 2012-06-12 DIAGNOSIS — O0930 Supervision of pregnancy with insufficient antenatal care, unspecified trimester: Secondary | ICD-10-CM

## 2012-06-12 DIAGNOSIS — O09529 Supervision of elderly multigravida, unspecified trimester: Secondary | ICD-10-CM | POA: Insufficient documentation

## 2012-06-12 DIAGNOSIS — O0990 Supervision of high risk pregnancy, unspecified, unspecified trimester: Secondary | ICD-10-CM

## 2012-06-12 DIAGNOSIS — O34219 Maternal care for unspecified type scar from previous cesarean delivery: Secondary | ICD-10-CM | POA: Insufficient documentation

## 2012-06-12 DIAGNOSIS — Z3689 Encounter for other specified antenatal screening: Secondary | ICD-10-CM | POA: Insufficient documentation

## 2012-06-19 ENCOUNTER — Ambulatory Visit (INDEPENDENT_AMBULATORY_CARE_PROVIDER_SITE_OTHER): Payer: Medicare HMO | Admitting: Obstetrics and Gynecology

## 2012-06-19 ENCOUNTER — Other Ambulatory Visit: Payer: Self-pay | Admitting: Obstetrics and Gynecology

## 2012-06-19 ENCOUNTER — Encounter: Payer: Self-pay | Admitting: Obstetrics and Gynecology

## 2012-06-19 ENCOUNTER — Other Ambulatory Visit (HOSPITAL_COMMUNITY)
Admission: RE | Admit: 2012-06-19 | Discharge: 2012-06-19 | Disposition: A | Discharge status: Home / Self Care | Payer: Medicare HMO | Source: Ambulatory Visit | Attending: Obstetrics and Gynecology | Admitting: Obstetrics and Gynecology

## 2012-06-19 VITALS — BP 106/72 | Wt 172.2 lb

## 2012-06-19 DIAGNOSIS — Z98891 History of uterine scar from previous surgery: Secondary | ICD-10-CM

## 2012-06-19 DIAGNOSIS — O0930 Supervision of pregnancy with insufficient antenatal care, unspecified trimester: Secondary | ICD-10-CM

## 2012-06-19 DIAGNOSIS — O0932 Supervision of pregnancy with insufficient antenatal care, second trimester: Secondary | ICD-10-CM

## 2012-06-19 DIAGNOSIS — O09522 Supervision of elderly multigravida, second trimester: Secondary | ICD-10-CM

## 2012-06-19 DIAGNOSIS — Z9889 Other specified postprocedural states: Secondary | ICD-10-CM

## 2012-06-19 DIAGNOSIS — O09529 Supervision of elderly multigravida, unspecified trimester: Secondary | ICD-10-CM

## 2012-06-19 DIAGNOSIS — N760 Acute vaginitis: Secondary | ICD-10-CM | POA: Insufficient documentation

## 2012-06-19 DIAGNOSIS — Z9884 Bariatric surgery status: Secondary | ICD-10-CM

## 2012-06-19 LAB — POCT URINALYSIS DIP (DEVICE)
Bilirubin Urine: NEGATIVE
Glucose, UA: NEGATIVE mg/dL
Hgb urine dipstick: NEGATIVE
Ketones, ur: NEGATIVE mg/dL
Leukocytes, UA: NEGATIVE
Nitrite: NEGATIVE
Protein, ur: NEGATIVE mg/dL
Specific Gravity, Urine: 1.02 (ref 1.005–1.030)
Urobilinogen, UA: 1 mg/dL (ref 0.0–1.0)
pH: 7 (ref 5.0–8.0)

## 2012-06-19 MED ORDER — OXYCODONE-ACETAMINOPHEN 5-325 MG PO TABS: 1.0000 | ORAL_TABLET | ORAL | Status: DC | PRN

## 2012-06-19 MED ORDER — DOCUSATE SODIUM 100 MG PO CAPS: 100.0000 mg | ORAL_CAPSULE | Freq: Two times a day (BID) | ORAL | Status: DC

## 2012-06-19 NOTE — Progress Notes (Signed)
Patient doing well. Reports falling on her hip 6 days ago. Patient did not go to ED. She is able to ambulate without difficulty but reports a lot of pain in that area.  Patient requesting pain medication. She has been on narcotics for the past 15 years secondary to various injuries and chronic arthritic pain. Patient was prescribed percocet by PCP who has now deferred to [place], according to patient. Advised patient to follow-up with PCP for hip evaluation. Also informed patient that chronic narcotic use in pregnancy may result in fetal withdrawal symptoms. Patient verbalized understanding. Patient desires to be re-tested for BV. Reports a persistent discharge and some pruritis.

## 2012-06-19 NOTE — Progress Notes (Signed)
Pulse: 77 Pt fell in bathroom and hit her left hip. Would like to discuss pain management. Also concerned with headaches.  Would like to be checked for BV again.

## 2012-06-20 ENCOUNTER — Telehealth: Payer: Self-pay | Admitting: *Deleted

## 2012-06-20 NOTE — Telephone Encounter (Signed)
Pt left message requesting test results from yesterday. I returned pt call and informed her that the test results are not completed. She will receive a call if abnormal but may call back ina couple days if she desires.  Pt voiced understanding.

## 2012-06-27 ENCOUNTER — Telehealth: Payer: Self-pay

## 2012-06-27 NOTE — Telephone Encounter (Addendum)
Patient left a message that she would like to talk to a provider about getting a prescription for pain medication until she can get into the pain clinic. 07/06/12- patient spoke with Marylynn Pearson, RN on 06/28/12.  Was given advice and satisfied with information.  Patient has fu appointment on Monday 07/10/12

## 2012-06-28 ENCOUNTER — Telehealth: Payer: Self-pay | Admitting: General Practice

## 2012-06-28 NOTE — Telephone Encounter (Signed)
Patient called and left message stating she left a message yesterday about wanting pain medicine and she is still waiting to hear back from the doctor and would like an update about what's going on because she's in a lot of pain and would like a call back. Called patient back and told her I received her message about requesting pain medicine and that I spoke with a doctor here and they suggested to keep using tylenol and to try a heating pad as well. Patient said she's been trying that but it's not going to work given her long history of narcotics but okay. Patient also stated she's been having constipation issues and she's tried the colace they suggested and has even taken two twice a day but nothing helps. Suggested to the patient that she use Miralax. Patient verbalized understanding and had no further questions

## 2012-07-09 ENCOUNTER — Inpatient Hospital Stay (HOSPITAL_COMMUNITY)
Admission: AD | Admit: 2012-07-09 | Discharge: 2012-07-09 | Disposition: A | Discharge status: Home / Self Care | Payer: Medicare HMO | Source: Ambulatory Visit | Attending: Obstetrics & Gynecology | Admitting: Obstetrics & Gynecology

## 2012-07-09 ENCOUNTER — Inpatient Hospital Stay (HOSPITAL_COMMUNITY): Payer: Medicare HMO

## 2012-07-09 ENCOUNTER — Encounter (HOSPITAL_COMMUNITY): Payer: Self-pay

## 2012-07-09 DIAGNOSIS — O2390 Unspecified genitourinary tract infection in pregnancy, unspecified trimester: Secondary | ICD-10-CM | POA: Insufficient documentation

## 2012-07-09 DIAGNOSIS — B9689 Other specified bacterial agents as the cause of diseases classified elsewhere: Secondary | ICD-10-CM | POA: Insufficient documentation

## 2012-07-09 DIAGNOSIS — O209 Hemorrhage in early pregnancy, unspecified: Secondary | ICD-10-CM | POA: Insufficient documentation

## 2012-07-09 DIAGNOSIS — H538 Other visual disturbances: Secondary | ICD-10-CM | POA: Insufficient documentation

## 2012-07-09 DIAGNOSIS — A499 Bacterial infection, unspecified: Secondary | ICD-10-CM | POA: Insufficient documentation

## 2012-07-09 DIAGNOSIS — N760 Acute vaginitis: Secondary | ICD-10-CM | POA: Insufficient documentation

## 2012-07-09 DIAGNOSIS — R109 Unspecified abdominal pain: Secondary | ICD-10-CM | POA: Insufficient documentation

## 2012-07-09 DIAGNOSIS — B3731 Acute candidiasis of vulva and vagina: Secondary | ICD-10-CM | POA: Insufficient documentation

## 2012-07-09 DIAGNOSIS — R51 Headache: Secondary | ICD-10-CM | POA: Insufficient documentation

## 2012-07-09 HISTORY — DX: Anemia, unspecified: D64.9

## 2012-07-09 LAB — URINE MICROSCOPIC-ADD ON

## 2012-07-09 LAB — URINALYSIS, ROUTINE W REFLEX MICROSCOPIC
Bilirubin Urine: NEGATIVE
Glucose, UA: NEGATIVE mg/dL
Ketones, ur: NEGATIVE mg/dL
Nitrite: NEGATIVE
Protein, ur: NEGATIVE mg/dL
Specific Gravity, Urine: 1.01 (ref 1.005–1.030)
Urobilinogen, UA: 0.2 mg/dL (ref 0.0–1.0)
pH: 6 (ref 5.0–8.0)

## 2012-07-09 LAB — WET PREP, GENITAL: Trich, Wet Prep: NONE SEEN

## 2012-07-09 MED ORDER — METRONIDAZOLE 500 MG PO TABS: 500.0000 mg | ORAL_TABLET | Freq: Two times a day (BID) | ORAL | Status: DC

## 2012-07-09 MED ORDER — BUTALBITAL-APAP-CAFFEINE 50-325-40 MG PO TABS: 1.0000 | ORAL_TABLET | Freq: Four times a day (QID) | ORAL | Status: DC | PRN

## 2012-07-09 MED ORDER — BUTALBITAL-APAP-CAFFEINE 50-325-40 MG PO TABS
1.0000 | ORAL_TABLET | Freq: Once | ORAL | Status: AC
  Administered 2012-07-09: 1 via ORAL
  Filled 2012-07-09: qty 1

## 2012-07-09 MED ORDER — CLOTRIMAZOLE-BETAMETHASONE 1-0.05 % EX CREA: TOPICAL_CREAM | Freq: Two times a day (BID) | CUTANEOUS | Status: DC

## 2012-07-09 MED ORDER — BUTALBITAL-APAP-CAFFEINE 50-325-40 MG PO TABS: 1.0000 | ORAL_TABLET | Freq: Once | ORAL | Status: DC

## 2012-07-09 NOTE — MAU Note (Signed)
Lower abdominal cramping/pressure & pink spotting since yesterday. Pink spotting when wipes.

## 2012-07-09 NOTE — Discharge Instructions (Signed)
 Bacterial Vaginosis Bacterial vaginosis (BV) is a vaginal infection where the normal balance of bacteria in the vagina is disrupted. The normal balance is then replaced by an overgrowth of certain bacteria. There are several different kinds of bacteria that can cause BV. BV is the most common vaginal infection in women of childbearing age. CAUSES   The cause of BV is not fully understood. BV develops when there is an increase or imbalance of harmful bacteria.  Some activities or behaviors can upset the normal balance of bacteria in the vagina and put women at increased risk including:  Having a new sex partner or multiple sex partners.  Douching.  Using an intrauterine device (IUD) for contraception.  It is not clear what role sexual activity plays in the development of BV. However, women that have never had sexual intercourse are rarely infected with BV. Women do not get BV from toilet seats, bedding, swimming pools or from touching objects around them.  SYMPTOMS   Grey vaginal discharge.  A fish-like odor with discharge, especially after sexual intercourse.  Itching or burning of the vagina and vulva.  Burning or pain with urination.  Some women have no signs or symptoms at all. DIAGNOSIS  Your caregiver must examine the vagina for signs of BV. Your caregiver will perform lab tests and look at the sample of vaginal fluid through a microscope. They will look for bacteria and abnormal cells (clue cells), a pH test higher than 4.5, and a positive amine test all associated with BV.  RISKS AND COMPLICATIONS   Pelvic inflammatory disease (PID).  Infections following gynecology surgery.  Developing HIV.  Developing herpes virus. TREATMENT  Sometimes BV will clear up without treatment. However, all women with symptoms of BV should be treated to avoid complications, especially if gynecology surgery is planned. Female partners generally do not need to be treated. However, BV may spread  between female sex partners so treatment is helpful in preventing a recurrence of BV.   BV may be treated with antibiotics. The antibiotics come in either pill or vaginal cream forms. Either can be used with nonpregnant or pregnant women, but the recommended dosages differ. These antibiotics are not harmful to the baby.  BV can recur after treatment. If this happens, a second round of antibiotics will often be prescribed.  Treatment is important for pregnant women. If not treated, BV can cause a premature delivery, especially for a pregnant woman who had a premature birth in the past. All pregnant women who have symptoms of BV should be checked and treated.  For chronic reoccurrence of BV, treatment with a type of prescribed gel vaginally twice a week is helpful. HOME CARE INSTRUCTIONS   Finish all medication as directed by your caregiver.  Do not have sex until treatment is completed.  Tell your sexual partner that you have a vaginal infection. They should see their caregiver and be treated if they have problems, such as a mild rash or itching.  Practice safe sex. Use condoms. Only have 1 sex partner. PREVENTION  Basic prevention steps can help reduce the risk of upsetting the natural balance of bacteria in the vagina and developing BV:  Do not have sexual intercourse (be abstinent).  Do not douche.  Use all of the medicine prescribed for treatment of BV, even if the signs and symptoms go away.  Tell your sex partner if you have BV. That way, they can be treated, if needed, to prevent reoccurrence. SEEK MEDICAL CARE IF:  Your symptoms are not improving after 3 days of treatment.  You have increased discharge, pain, or fever. MAKE SURE YOU:   Understand these instructions.  Will watch your condition.  Will get help right away if you are not doing well or get worse. FOR MORE INFORMATION  Division of STD Prevention (DSTDP), Centers for Disease Control and Prevention:  [employer]Apps.[employer].za American Social Health Association (ASHA): www.ashastd.org  Document Released: 04/12/2005 Document Revised: 07/05/2011 Document Reviewed: 10/03/2008 [facility] Patient Information 2013 [place], [place].  Vaginal Bleeding During Pregnancy A small amount of bleeding from the vagina can happen anytime during pregnancy. Be sure to tell your doctor about all vaginal bleeding.  HOME CARE  Get plenty of rest and sleep.  Count the number of pads you use each day. Do not use tampons.  Save any tissue you pass for your doctor to see.  Do not exercise  Do not do any heavy lifting.  Avoid going up and down stairs. If you must climb stairs, go slowly.  Do not have sex (intercourse) or orgasms until approved by your doctor.  Do not douche.  Only take medicine as told by your doctor. Do not take aspirin.  Eat healthy.  Always keep your follow-up appointments. GET HELP RIGHT AWAY IF:   You feel the baby moving less or not moving at all.  The bleeding gets worse.  You have very painful cramps or pain in your stomach or back.  You pass large clots or anything that looks like tissue.  You have a temperature by mouth above 102 F (38.9 C).  You feel very weak.  You have chills.  You feel dizzy or pass out (faint).  You have a gush of fluid from the vagina. MAKE SURE YOU:   Understand these instructions.  Will watch your condition.  Will get help right away if you are not doing well or get worse. Document Released: 01/20/2008 Document Revised: 07/05/2011 Document Reviewed: 03/18/2009 [facility] Patient Information 2013 [place], [place].

## 2012-07-09 NOTE — MAU Provider Note (Signed)
History   Pt is a 47 y/o [id number] at [redacted]w[redacted]d presenting with lower abdominal pain and small amount of bleeding  Abdominal pain: Pt mentions 2 episodes over 7/10 lower abdominal pain that starts in her back and radiates to her pelvis associated with two episodes of bleed on toilet paper after wiping, and one episode of vomiting.  Pt denies increased urinary frequency, vaginal discharge, large loss of fluid, blood in her urine or stool.  Pt says this is not similar to last BV dx.  Pt reports continue fetal movement.  Pt hasn't tried anything for it, nothing makes it better or worse.    Headache: Pt mentions continued headaches throughout pregnancy over her L forehead, associated with blurry vision.  Denies RUQ pain, or hx of high blood pressures.  Pt has tried tylenol and iburopfen for the headaches with little to no relief.    CSN: [id number]  Arrival date and time: 07/09/12 1922   None     Chief Complaint  Patient presents with  . Abdominal Cramping  . Vaginal Bleeding   Vaginal Bleeding Associated symptoms include abdominal pain and back pain. Pertinent negatives include no chills, joint pain, rash, sore throat or urgency.    OB History   Grav Para Term Preterm Abortions TAB SAB Ect Mult Living   6 3 3  0 2  2   3       Past Medical History  Diagnosis Date  . Chronic pain   . Bulging lumbar disc   . Abnormal Pap smear   . Anxiety     Takes xanax  . Arthritis     Has rx for percocet  . Migraines   . Anemia     Past Surgical History  Procedure Laterality Date  . Cesarean section      X 3  . Cervical cone biopsy    . Gastric bypass  2007    Family History  Problem Relation Age of Onset  . Diabetes Father   . Depression Maternal Grandmother   . Heart disease Maternal Grandfather   . Depression Paternal Grandmother     History  Substance Use Topics  . Smoking status: Former Smoker -- 0.50 packs/day for 10 years    Types: Cigarettes    Quit date: 03/16/2012  .  Smokeless tobacco: Never Used  . Alcohol Use: No     Comment: Had "issues" then socially, now none    Allergies: No Known Allergies  Prescriptions prior to admission  Medication Sig Dispense Refill  . BIOTIN PO Take 1 tablet by mouth daily.      . Cyanocobalamin (VITAMIN B-12 PO) Take 1 tablet by mouth 2 (two) times daily.      Marland Kitchen docusate sodium (COLACE) 100 MG capsule Take 1 capsule (100 mg total) by mouth 2 (two) times daily.  30 capsule  2  . ferrous gluconate (FERGON) 325 MG tablet Take 325 mg by mouth 3 (three) times daily with meals.      Marland Kitchen oxyCODONE-acetaminophen (PERCOCET/ROXICET) 5-325 MG per tablet Take 1 tablet by mouth every 4 (four) hours as needed for pain.  20 tablet  0  . prenatal vitamin w/FE, FA (PRENATAL 1 + 1) 27-1 MG TABS Take 1 tablet by mouth daily.  30 each  0  . metroNIDAZOLE (FLAGYL) 500 MG tablet         Review of Systems  Constitutional: Negative for chills.  HENT: Negative for hearing loss, congestion and sore throat.   Eyes: Positive  for blurred vision. Negative for double vision.  Respiratory: Negative for cough and wheezing.   Cardiovascular: Negative for chest pain, palpitations and leg swelling.  Gastrointestinal: Positive for abdominal pain. Negative for heartburn.  Genitourinary: Positive for vaginal bleeding. Negative for urgency.  Musculoskeletal: Positive for back pain. Negative for joint pain.  Skin: Negative for itching and rash.  Neurological: Negative for dizziness and tingling.  Endo/Heme/Allergies: Negative for environmental allergies. Does not bruise/bleed easily.  All other systems reviewed and are negative.   Physical Exam   Blood pressure 124/72, pulse 81, temperature 98.5 F (36.9 C), temperature source Oral, resp. rate 20, last menstrual period 01/28/2012.  Physical Exam  Constitutional: She is oriented to person, place, and time. She appears well-developed and well-nourished. No distress.  HENT:  Head: Normocephalic.  Eyes:  EOM are normal. Pupils are equal, round, and reactive to light.  Neck: Normal range of motion. Neck supple.  Cardiovascular: Normal rate, regular rhythm and intact distal pulses.  Exam reveals no gallop and no friction rub.   No murmur heard. Respiratory: Breath sounds normal. No respiratory distress. She has no wheezes. She has no rales.  GI: Soft. Bowel sounds are normal. She exhibits no distension. There is tenderness (suprapubic tenderness). There is no rebound and no guarding.  Genitourinary: Pelvic exam was performed with patient prone. No bleeding around the vagina. No signs of injury around the vagina. Vaginal discharge found.  White, chunky accumulations within the vagina.    Musculoskeletal: Normal range of motion. She exhibits tenderness. She exhibits no edema.  Neurological: She is alert and oriented to person, place, and time.  Skin: Skin is warm and dry. She is not diaphoretic.  Psychiatric: She has a normal mood and affect. Her behavior is normal. Thought content normal.    FHR: Baseline 135, accelerations present, no decelerations, no contractions, category 1 tracing  Results for orders placed during the hospital encounter of 07/09/12 (from the past 24 hour(s))  URINALYSIS, ROUTINE W REFLEX MICROSCOPIC     Status: Abnormal   Collection Time    07/09/12  7:28 PM      Result Value Range   Color, Urine YELLOW  YELLOW   APPearance CLEAR  CLEAR   Specific Gravity, Urine 1.010  1.005 - 1.030   pH 6.0  5.0 - 8.0   Glucose, UA NEGATIVE  NEGATIVE mg/dL   Hgb urine dipstick TRACE (*) NEGATIVE   Bilirubin Urine NEGATIVE  NEGATIVE   Ketones, ur NEGATIVE  NEGATIVE mg/dL   Protein, ur NEGATIVE  NEGATIVE mg/dL   Urobilinogen, UA 0.2  0.0 - 1.0 mg/dL   Nitrite NEGATIVE  NEGATIVE   Leukocytes, UA SMALL (*) NEGATIVE  URINE MICROSCOPIC-ADD ON     Status: None   Collection Time    07/09/12  7:28 PM      Result Value Range   Squamous Epithelial / LPF RARE  RARE   WBC, UA 0-2  <3  WBC/hpf   RBC / HPF 0-2  <3 RBC/hpf   Bacteria, UA RARE  RARE  WET PREP, GENITAL     Status: Abnormal   Collection Time    07/09/12  9:28 PM      Result Value Range   Yeast Wet Prep HPF POC FEW (*) NONE SEEN   Trich, Wet Prep NONE SEEN  NONE SEEN   Clue Cells Wet Prep HPF POC FEW (*) NONE SEEN   WBC, Wet Prep HPF POC FEW (*) NONE SEEN   [place]: Cervical  length 3.0 cm (3.5 cm on 06/12/12)   MAU Course  Procedures  MDM   Assessment and Plan  Pt is a 47 y/o [id number] at [redacted]w[redacted]d presenting with lower abdominal pain and small amount of bleeding  # Bacterial vaginosis:  -- positive clue cells on wet prep -- metronidazole 500 mg BID x 7 days -- d/c to home -- f/u w/ routine care as scheduled   # Vaginal Candidiasis: -- positive yeast on wet prep -- clotrimazole 1% cream, once daily for 7 days.   # Headache:  -- normotensive  -- Fioricet 1-2 tabs q4 hrs prn pain    # Vaginal bleeding: pt mentions 2 episodes of scant vaginal bleeding with wiping after urination -- transvaginal u/s cervical length of 3.0 cm change from 3.5 cm    Gregor Hams 07/09/2012, 10:52 PM   Seen and examined by me also Cervix was closed and felt effaced to me, but pt has had conization in past and [place] cx length was good Agree with note Wynelle Bourgeois CNM

## 2012-07-10 ENCOUNTER — Encounter: Payer: Medicare HMO | Admitting: Obstetrics & Gynecology

## 2012-07-12 NOTE — MAU Provider Note (Signed)
Attestation of Attending Supervision of Advanced Practitioner (CNM/NP): Evaluation and management procedures were performed by the Advanced Practitioner under my supervision and collaboration.  I have reviewed the Advanced Practitioner's note and chart, and I agree with the management and plan.  HARRAWAY-SMITH, Emmalia Heyboer 9:46 AM     

## 2012-07-17 ENCOUNTER — Ambulatory Visit (INDEPENDENT_AMBULATORY_CARE_PROVIDER_SITE_OTHER): Payer: Medicare HMO | Admitting: Family Medicine

## 2012-07-17 ENCOUNTER — Encounter: Payer: Self-pay | Admitting: Family Medicine

## 2012-07-17 ENCOUNTER — Other Ambulatory Visit: Payer: Self-pay | Admitting: Family Medicine

## 2012-07-17 VITALS — BP 104/65 | Temp 97.4°F | Wt 180.4 lb

## 2012-07-17 DIAGNOSIS — O09529 Supervision of elderly multigravida, unspecified trimester: Secondary | ICD-10-CM

## 2012-07-17 DIAGNOSIS — O0930 Supervision of pregnancy with insufficient antenatal care, unspecified trimester: Secondary | ICD-10-CM

## 2012-07-17 DIAGNOSIS — O34219 Maternal care for unspecified type scar from previous cesarean delivery: Secondary | ICD-10-CM

## 2012-07-17 DIAGNOSIS — O0932 Supervision of pregnancy with insufficient antenatal care, second trimester: Secondary | ICD-10-CM

## 2012-07-17 DIAGNOSIS — Z98891 History of uterine scar from previous surgery: Secondary | ICD-10-CM

## 2012-07-17 DIAGNOSIS — O09522 Supervision of elderly multigravida, second trimester: Secondary | ICD-10-CM

## 2012-07-17 LAB — POCT URINALYSIS DIP (DEVICE)
Bilirubin Urine: NEGATIVE
Glucose, UA: NEGATIVE mg/dL
Hgb urine dipstick: NEGATIVE
Ketones, ur: NEGATIVE mg/dL
Leukocytes, UA: NEGATIVE
Nitrite: NEGATIVE
Protein, ur: 30 mg/dL — AB
Specific Gravity, Urine: 1.02 (ref 1.005–1.030)
Urobilinogen, UA: 0.2 mg/dL (ref 0.0–1.0)
pH: 6.5 (ref 5.0–8.0)

## 2012-07-17 NOTE — Patient Instructions (Signed)
Pregnancy - Second Trimester The second trimester of pregnancy (3 to 6 months) is a period of rapid growth for you and your baby. At the end of the sixth month, your baby is about 9 inches long and weighs 1 1/2 pounds. You will begin to feel the baby move between 18 and 20 weeks of the pregnancy. This is called quickening. Weight gain is faster. A clear fluid (colostrum) may leak out of your breasts. You may feel small contractions of the womb (uterus). This is known as false labor or Braxton-Hicks contractions. This is like a practice for labor when the baby is ready to be born. Usually, the problems with morning sickness have usually passed by the end of your first trimester. Some women develop small dark blotches (called cholasma, mask of pregnancy) on their face that usually goes away after the baby is born. Exposure to the sun makes the blotches worse. Acne may also develop in some pregnant women and pregnant women who have acne, may find that it goes away. PRENATAL EXAMS  Blood work may continue to be done during prenatal exams. These tests are done to check on your health and the probable health of your baby. Blood work is used to follow your blood levels (hemoglobin). Anemia (low hemoglobin) is common during pregnancy. Iron and vitamins are given to help prevent this. You will also be checked for diabetes between 24 and 28 weeks of the pregnancy. Some of the previous blood tests may be repeated.  The size of the uterus is measured during each visit. This is to make sure that the baby is continuing to grow properly according to the dates of the pregnancy.  Your blood pressure is checked every prenatal visit. This is to make sure you are not getting toxemia.  Your urine is checked to make sure you do not have an infection, diabetes or protein in the urine.  Your weight is checked often to make sure gains are happening at the suggested rate. This is to ensure that both you and your baby are  growing normally.  Sometimes, an ultrasound is performed to confirm the proper growth and development of the baby. This is a test which bounces harmless sound waves off the baby so your caregiver can more accurately determine due dates. Sometimes, a specialized test is done on the amniotic fluid surrounding the baby. This test is called an amniocentesis. The amniotic fluid is obtained by sticking a needle into the belly (abdomen). This is done to check the chromosomes in instances where there is a concern about possible genetic problems with the baby. It is also sometimes done near the end of pregnancy if an early delivery is required. In this case, it is done to help make sure the baby's lungs are mature enough for the baby to live outside of the womb. CHANGES OCCURING IN THE SECOND TRIMESTER OF PREGNANCY Your body goes through many changes during pregnancy. They vary from person to person. Talk to your caregiver about changes you notice that you are concerned about.  During the second trimester, you will likely have an increase in your appetite. It is normal to have cravings for certain foods. This varies from person to person and pregnancy to pregnancy.  Your lower abdomen will begin to bulge.  You may have to urinate more often because the uterus and baby are pressing on your bladder. It is also common to get more bladder infections during pregnancy (pain with urination). You can help this by   drinking lots of fluids and emptying your bladder before and after intercourse.  You may begin to get stretch marks on your hips, abdomen, and breasts. These are normal changes in the body during pregnancy. There are no exercises or medications to take that prevent this change.  You may begin to develop swollen and bulging veins (varicose veins) in your legs. Wearing support hose, elevating your feet for 15 minutes, 3 to 4 times a day and limiting salt in your diet helps lessen the problem.  Heartburn may  develop as the uterus grows and pushes up against the stomach. Antacids recommended by your caregiver helps with this problem. Also, eating smaller meals 4 to 5 times a day helps.  Constipation can be treated with a stool softener or adding bulk to your diet. Drinking lots of fluids, vegetables, fruits, and whole grains are helpful.  Exercising is also helpful. If you have been very active up until your pregnancy, most of these activities can be continued during your pregnancy. If you have been less active, it is helpful to start an exercise program such as walking.  Hemorrhoids (varicose veins in the rectum) may develop at the end of the second trimester. Warm sitz baths and hemorrhoid cream recommended by your caregiver helps hemorrhoid problems.  Backaches may develop during this time of your pregnancy. Avoid heavy lifting, wear low heal shoes and practice good posture to help with backache problems.  Some pregnant women develop tingling and numbness of their hand and fingers because of swelling and tightening of ligaments in the wrist (carpel tunnel syndrome). This goes away after the baby is born.  As your breasts enlarge, you may have to get a bigger bra. Get a comfortable, cotton, support bra. Do not get a nursing bra until the last month of the pregnancy if you will be nursing the baby.  You may get a dark line from your belly button to the pubic area called the linea nigra.  You may develop rosy cheeks because of increase blood flow to the face.  You may develop spider looking lines of the face, neck, arms and chest. These go away after the baby is born. HOME CARE INSTRUCTIONS   It is extremely important to avoid all smoking, herbs, alcohol, and unprescribed drugs during your pregnancy. These chemicals affect the formation and growth of the baby. Avoid these chemicals throughout the pregnancy to ensure the delivery of a healthy infant.  Most of your home care instructions are the same  as suggested for the first trimester of your pregnancy. Keep your caregiver's appointments. Follow your caregiver's instructions regarding medication use, exercise and diet.  During pregnancy, you are providing food for you and your baby. Continue to eat regular, well-balanced meals. Choose foods such as meat, fish, milk and other low fat dairy products, vegetables, fruits, and whole-grain breads and cereals. Your caregiver will tell you of the ideal weight gain.  A physical sexual relationship may be continued up until near the end of pregnancy if there are no other problems. Problems could include early (premature) leaking of amniotic fluid from the membranes, vaginal bleeding, abdominal pain, or other medical or pregnancy problems.  Exercise regularly if there are no restrictions. Check with your caregiver if you are unsure of the safety of some of your exercises. The greatest weight gain will occur in the last 2 trimesters of pregnancy. Exercise will help you:  Control your weight.  Get you in shape for labor and delivery.  Lose weight   after you have the baby.  Wear a good support or jogging bra for breast tenderness during pregnancy. This may help if worn during sleep. Pads or tissues may be used in the bra if you are leaking colostrum.  Do not use hot tubs, steam rooms or saunas throughout the pregnancy.  Wear your seat belt at all times when driving. This protects you and your baby if you are in an accident.  Avoid raw meat, uncooked cheese, cat litter boxes and soil used by cats. These carry germs that can cause birth defects in the baby.  The second trimester is also a good time to visit your dentist for your dental health if this has not been done yet. Getting your teeth cleaned is OK. Use a soft toothbrush. Brush gently during pregnancy.  It is easier to loose urine during pregnancy. Tightening up and strengthening the pelvic muscles will help with this problem. Practice stopping  your urination while you are going to the bathroom. These are the same muscles you need to strengthen. It is also the muscles you would use as if you were trying to stop from passing gas. You can practice tightening these muscles up 10 times a set and repeating this about 3 times per day. Once you know what muscles to tighten up, do not perform these exercises during urination. It is more likely to contribute to an infection by backing up the urine.  Ask for help if you have financial, counseling or nutritional needs during pregnancy. Your caregiver will be able to offer counseling for these needs as well as refer you for other special needs.  Your skin may become oily. If so, wash your face with mild soap, use non-greasy moisturizer and oil or cream based makeup. MEDICATIONS AND DRUG USE IN PREGNANCY  Take prenatal vitamins as directed. The vitamin should contain 1 milligram of folic acid. Keep all vitamins out of reach of children. Only a couple vitamins or tablets containing iron may be fatal to a baby or young child when ingested.  Avoid use of all medications, including herbs, over-the-counter medications, not prescribed or suggested by your caregiver. Only take over-the-counter or prescription medicines for pain, discomfort, or fever as directed by your caregiver. Do not use aspirin.  Let your caregiver also know about herbs you may be using.  Alcohol is related to a number of birth defects. This includes fetal alcohol syndrome. All alcohol, in any form, should be avoided completely. Smoking will cause low birth rate and premature babies.  Street or illegal drugs are very harmful to the baby. They are absolutely forbidden. A baby born to an addicted mother will be addicted at birth. The baby will go through the same withdrawal an adult does. SEEK MEDICAL CARE IF:  You have any concerns or worries during your pregnancy. It is better to call with your questions if you feel they cannot wait,  rather than worry about them. SEEK IMMEDIATE MEDICAL CARE IF:   An unexplained oral temperature above 102 F (38.9 C) develops, or as your caregiver suggests.  You have leaking of fluid from the vagina (birth canal). If leaking membranes are suspected, take your temperature and tell your caregiver of this when you call.  There is vaginal spotting, bleeding, or passing clots. Tell your caregiver of the amount and how many pads are used. Light spotting in pregnancy is common, especially following intercourse.  You develop a bad smelling vaginal discharge with a change in the color from clear   to white.  You continue to feel sick to your stomach (nauseated) and have no relief from remedies suggested. You vomit blood or coffee ground-like materials.  You lose more than 2 pounds of weight or gain more than 2 pounds of weight over 1 week, or as suggested by your caregiver.  You notice swelling of your face, hands, feet, or legs.  You get exposed to German measles and have never had them.  You are exposed to fifth disease or chickenpox.  You develop belly (abdominal) pain. Round ligament discomfort is a common non-cancerous (benign) cause of abdominal pain in pregnancy. Your caregiver still must evaluate you.  You develop a bad headache that does not go away.  You develop fever, diarrhea, pain with urination, or shortness of breath.  You develop visual problems, blurry, or double vision.  You fall or are in a car accident or any kind of trauma.  There is mental or physical violence at home. Document Released: 04/06/2001 Document Revised: 07/05/2011 Document Reviewed: 10/09/2008 [employer] Patient Information 2013 [employer].  Breastfeeding Deciding to breastfeed is one of the best choices you can make for you and your baby. The information that follows gives a brief overview of the benefits of breastfeeding as well as common topics surrounding breastfeeding. BENEFITS OF  BREASTFEEDING For the baby  The first milk (colostrum) helps the baby's digestive system function better.   There are antibodies in the mother's milk that help the baby fight off infections.   The baby has a lower incidence of asthma, allergies, and sudden infant death syndrome (SIDS).   The nutrients in breast milk are better for the baby than infant formulas, and breast milk helps the baby's brain grow better.   Babies who breastfeed have less gas, colic, and constipation.  For the mother  Breastfeeding helps develop a very special bond between the mother and her baby.   Breastfeeding is convenient, always available at the correct temperature, and costs nothing.   Breastfeeding burns calories in the mother and helps her lose weight that was gained during pregnancy.   Breastfeeding makes the uterus contract back down to normal size faster and slows bleeding following delivery.   Breastfeeding mothers have a lower risk of developing breast cancer.  BREASTFEEDING FREQUENCY  A healthy, full-term baby may breastfeed as often as every hour or space his or her feedings to every 3 hours.   Watch your baby for signs of hunger. Nurse your baby if he or she shows signs of hunger. How often you nurse will vary from baby to baby.   Nurse as often as the baby requests, or when you feel the need to reduce the fullness of your breasts.   Awaken the baby if it has been 3 4 hours since the last feeding.   Frequent feeding will help the mother make more milk and will help prevent problems, such as sore nipples and engorgement of the breasts.  BABY'S POSITION AT THE BREAST  Whether lying down or sitting, be sure that the baby's tummy is facing your tummy.   Support the breast with 4 fingers underneath the breast and the thumb above. Make sure your fingers are well away from the nipple and baby's mouth.   Stroke the baby's lips gently with your finger or nipple.   When the  baby's mouth is open wide enough, place all of your nipple and as much of the areola as possible into your baby's mouth.   Pull the baby in   close so the tip of the nose and the baby's cheeks touch the breast during the feeding.  FEEDINGS AND SUCTION  The length of each feeding varies from baby to baby and from feeding to feeding.   The baby must suck about 2 3 minutes for your milk to get to him or her. This is called a "let down." For this reason, allow the baby to feed on each breast as long as he or she wants. Your baby will end the feeding when he or she has received the right balance of nutrients.   To break the suction, put your finger into the corner of the baby's mouth and slide it between his or her gums before removing your breast from his or her mouth. This will help prevent sore nipples.  HOW TO TELL WHETHER YOUR BABY IS GETTING ENOUGH BREAST MILK. Wondering whether or not your baby is getting enough milk is a common concern among mothers. You can be assured that your baby is getting enough milk if:   Your baby is actively sucking and you hear swallowing.   Your baby seems relaxed and satisfied after a feeding.   Your baby nurses at least 8 12 times in a 24 hour time period. Nurse your baby until he or she unlatches or falls asleep at the first breast (at least 10 20 minutes), then offer the second side.   Your baby is wetting 5 6 disposable diapers (6 8 cloth diapers) in a 24 hour period by 5 6 days of age.   Your baby is having at least 3 4 stools every 24 hours for the first 6 weeks. The stool should be soft and yellow.   Your baby should gain 4 7 ounces per week after he or she is 4 days old.   Your breasts feel softer after nursing.  REDUCING BREAST ENGORGEMENT  In the first week after your baby is born, you may experience signs of breast engorgement. When breasts are engorged, they feel heavy, warm, full, and may be tender to the touch. You can reduce  engorgement if you:   Nurse frequently, every 2 3 hours. Mothers who breastfeed early and often have fewer problems with engorgement.   Place light ice packs on your breasts for 10 20 minutes between feedings. This reduces swelling. Wrap the ice packs in a lightweight towel to protect your skin. Bags of frozen vegetables work well for this purpose.   Take a warm shower or apply warm, moist heat to your breast for 5 10 minutes just before each feeding. This increases circulation and helps the milk flow.   Gently massage your breast before and during the feeding. Using your finger tips, massage from the chest wall towards your nipple in a circular motion.   Make sure that the baby empties at least one breast at every feeding before switching sides.   Use a breast pump to empty the breasts if your baby is sleepy or not nursing well. You may also want to pump if you are returning to work oryou feel you are getting engorged.   Avoid bottle feeds, pacifiers, or supplemental feedings of water or juice in place of breastfeeding. Breast milk is all the food your baby needs. It is not necessary for your baby to have water or formula. In fact, to help your breasts make more milk, it is best not to give your baby supplemental feedings during the early weeks.   Be sure the baby is latched   on and positioned properly while breastfeeding.   Wear a supportive bra, avoiding underwire styles.   Eat a balanced diet with enough fluids.   Rest often, relax, and take your prenatal vitamins to prevent fatigue, stress, and anemia.  If you follow these suggestions, your engorgement should improve in 24 48 hours. If you are still experiencing difficulty, call your lactation consultant or caregiver.  CARING FOR YOURSELF Take care of your breasts  Bathe or shower daily.   Avoid using soap on your nipples.   Start feedings on your left breast at one feeding and on your right breast at the next  feeding.   You will notice an increase in your milk supply 2 5 days after delivery. You may feel some discomfort from engorgement, which makes your breasts very firm and often tender. Engorgement "peaks" out within 24 48 hours. In the meantime, apply warm moist towels to your breasts for 5 10 minutes before feeding. Gentle massage and expression of some milk before feeding will soften your breasts, making it easier for your baby to latch on.   Wear a well-fitting nursing bra, and air dry your nipples for a 3 4minutes after each feeding.   Only use cotton bra pads.   Only use pure lanolin on your nipples after nursing. You do not need to wash it off before feeding the baby again. Another option is to express a few drops of breast milk and gently massage it into your nipples.  Take care of yourself  Eat well-balanced meals and nutritious snacks.   Drinking milk, fruit juice, and water to satisfy your thirst (about 8 glasses a day).   Get plenty of rest.  Avoid foods that you notice affect the baby in a bad way.  SEEK MEDICAL CARE IF:   You have difficulty with breastfeeding and need help.   You have a hard, red, sore area on your breast that is accompanied by a fever.   Your baby is too sleepy to eat well or is having trouble sleeping.   Your baby is wetting less than 6 diapers a day, by 5 days of age.   Your baby's skin or white part of his or her eyes is more yellow than it was in the hospital.   You feel depressed.  Document Released: 04/12/2005 Document Revised: 10/12/2011 Document Reviewed: 07/11/2011 [employer] Patient Information 2013 [employer].  

## 2012-07-17 NOTE — Progress Notes (Signed)
Doing well--needs serial U/s for growth and 2x/wk testing at 36 wks.

## 2012-07-21 ENCOUNTER — Telehealth: Payer: Self-pay | Admitting: *Deleted

## 2012-07-21 ENCOUNTER — Encounter (HOSPITAL_COMMUNITY): Payer: Self-pay | Admitting: *Deleted

## 2012-07-21 ENCOUNTER — Inpatient Hospital Stay (HOSPITAL_COMMUNITY)
Admission: AD | Admit: 2012-07-21 | Discharge: 2012-07-21 | Disposition: A | Discharge status: Home / Self Care | Payer: Medicare HMO | Source: Ambulatory Visit | Attending: Obstetrics & Gynecology | Admitting: Obstetrics & Gynecology

## 2012-07-21 DIAGNOSIS — O99340 Other mental disorders complicating pregnancy, unspecified trimester: Secondary | ICD-10-CM | POA: Insufficient documentation

## 2012-07-21 DIAGNOSIS — R45851 Suicidal ideations: Secondary | ICD-10-CM | POA: Insufficient documentation

## 2012-07-21 DIAGNOSIS — F3289 Other specified depressive episodes: Secondary | ICD-10-CM | POA: Insufficient documentation

## 2012-07-21 DIAGNOSIS — F329 Major depressive disorder, single episode, unspecified: Secondary | ICD-10-CM

## 2012-07-21 MED ORDER — FLUOXETINE HCL 20 MG PO CAPS: 20.0000 mg | ORAL_CAPSULE | Freq: Every day | ORAL | Status: DC

## 2012-07-21 MED ORDER — BUTALBITAL-APAP-CAFFEINE 50-325-40 MG PO TABS
1.0000 | ORAL_TABLET | ORAL | Status: DC | PRN
  Administered 2012-07-21: 1 via ORAL
  Filled 2012-07-21: qty 1

## 2012-07-21 MED ORDER — FLUOXETINE HCL 20 MG PO CAPS
20.0000 mg | ORAL_CAPSULE | Freq: Once | ORAL | Status: AC
  Administered 2012-07-21: 20 mg via ORAL
  Filled 2012-07-21: qty 1
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
## 2012-07-21 NOTE — Discharge Instructions (Signed)
Depression  °Depression is a strong emotion of feeling unhappy that can last for weeks, months, or even longer. Depression causes problems with the ability to function in life. It upsets your:  °· Relationships. °· Sleep. °· Eating habits. °· Work habits. °HOME CARE °· Take all medicine as told by your doctor. °· Talk with a therapist, counselor, or friend. °· Eat a healthy diet. °· Exercise regularly. °· Do not drink alcohol or use drugs. °GET HELP RIGHT AWAY IF: °You start to have thoughts about hurting yourself or others. °MAKE SURE YOU: °· Understand these instructions. °· Will watch your condition. °· Will get help right away if you are not doing well or get worse. °Document Released: 05/15/2010 Document Revised: 07/05/2011 Document Reviewed: 05/15/2010 °[employer]® Patient Information ©2013 [employer]. ° °

## 2012-07-21 NOTE — MAU Note (Signed)
Pt brought in by police officer, he states that he picked pt up at home because a third party called & reported that the pt was threatening suicide.  Pt states she has hx of depression & has been very depressed for the past 2 days, can't stop crying.  Pt says she can't get motivated about anything, "I just can't pick myself up from this."  Pt states she has chronic back pain, also having LRQ pain today.  Pt states she has had suicidal thoughts today, has made suicide attempts in the past.

## 2012-07-21 NOTE — Telephone Encounter (Signed)
Pt left message requesting to schedule amniocentesis.  I returned pt's call and discussed her concern. She stated that because she is 46 and the FOB is 60, she wanted to make sure that her baby is normal. I reviewed her results of Quad screen and all US findings. I explained to pt that these tests do not indicate a reason for her to need amniocentesis and that all results are normal. She will have another [place] as scheduled on 3/31. She will be contacted if this result is abnormal. Otherwise, I advised pt to discuss her concerns and questions with provider at her next clinic visit on 4/14.  Pt voiced understanding.

## 2012-07-21 NOTE — MAU Note (Signed)
Spoke with [facility], is checking to see if someone is available to see pt now, if not someone will be available after 1900.  MD informed.

## 2012-07-21 NOTE — MAU Note (Signed)
Ava with ACT team into see patient.
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
## 2012-07-21 NOTE — MAU Note (Signed)
Called [facility] ACT team to come to assess patient.

## 2012-07-24 ENCOUNTER — Ambulatory Visit (HOSPITAL_COMMUNITY): Admission: RE | Admit: 2012-07-24 | Payer: Medicare HMO | Source: Ambulatory Visit

## 2012-07-24 ENCOUNTER — Encounter (HOSPITAL_COMMUNITY): Payer: Self-pay | Admitting: *Deleted

## 2012-07-24 ENCOUNTER — Inpatient Hospital Stay (HOSPITAL_COMMUNITY)
Admission: AD | Admit: 2012-07-24 | Discharge: 2012-07-24 | Disposition: A | Discharge status: Home / Self Care | Payer: Medicare HMO | Source: Ambulatory Visit | Attending: Obstetrics & Gynecology | Admitting: Obstetrics & Gynecology

## 2012-07-24 ENCOUNTER — Telehealth: Payer: Self-pay

## 2012-07-24 DIAGNOSIS — O2390 Unspecified genitourinary tract infection in pregnancy, unspecified trimester: Secondary | ICD-10-CM | POA: Insufficient documentation

## 2012-07-24 DIAGNOSIS — O99019 Anemia complicating pregnancy, unspecified trimester: Secondary | ICD-10-CM | POA: Insufficient documentation

## 2012-07-24 DIAGNOSIS — M549 Dorsalgia, unspecified: Secondary | ICD-10-CM | POA: Diagnosis present

## 2012-07-24 DIAGNOSIS — R1031 Right lower quadrant pain: Secondary | ICD-10-CM | POA: Insufficient documentation

## 2012-07-24 DIAGNOSIS — D649 Anemia, unspecified: Secondary | ICD-10-CM | POA: Diagnosis present

## 2012-07-24 DIAGNOSIS — O99891 Other specified diseases and conditions complicating pregnancy: Secondary | ICD-10-CM | POA: Diagnosis present

## 2012-07-24 DIAGNOSIS — B373 Candidiasis of vulva and vagina: Secondary | ICD-10-CM

## 2012-07-24 DIAGNOSIS — B3731 Acute candidiasis of vulva and vagina: Secondary | ICD-10-CM | POA: Insufficient documentation

## 2012-07-24 DIAGNOSIS — O99012 Anemia complicating pregnancy, second trimester: Secondary | ICD-10-CM

## 2012-07-24 DIAGNOSIS — O9902 Anemia complicating childbirth: Secondary | ICD-10-CM

## 2012-07-24 DIAGNOSIS — N949 Unspecified condition associated with female genital organs and menstrual cycle: Secondary | ICD-10-CM | POA: Insufficient documentation

## 2012-07-24 LAB — URINALYSIS, ROUTINE W REFLEX MICROSCOPIC
Bilirubin Urine: NEGATIVE
Glucose, UA: NEGATIVE mg/dL
Hgb urine dipstick: NEGATIVE
Ketones, ur: 15 mg/dL — AB
Leukocytes, UA: NEGATIVE
Nitrite: NEGATIVE
Protein, ur: NEGATIVE mg/dL
Specific Gravity, Urine: 1.015 (ref 1.005–1.030)
Urobilinogen, UA: 1 mg/dL (ref 0.0–1.0)
pH: 7 (ref 5.0–8.0)

## 2012-07-24 LAB — AMNISURE RUPTURE OF MEMBRANE (ROM) NOT AT ARMC: Amnisure ROM: NEGATIVE

## 2012-07-24 LAB — CBC
HCT: 25.2 % — ABNORMAL LOW (ref 36.0–46.0)
Hemoglobin: 8.8 g/dL — ABNORMAL LOW (ref 12.0–15.0)
MCH: 29.8 pg (ref 26.0–34.0)
MCHC: 34.9 g/dL (ref 30.0–36.0)
MCV: 85.4 fL (ref 78.0–100.0)
Platelets: 239 10*3/uL (ref 150–400)
RBC: 2.95 MIL/uL — ABNORMAL LOW (ref 3.87–5.11)
RDW: 13.7 % (ref 11.5–15.5)
WBC: 6.5 10*3/uL (ref 4.0–10.5)

## 2012-07-24 LAB — WET PREP, GENITAL
Clue Cells Wet Prep HPF POC: NONE SEEN
Trich, Wet Prep: NONE SEEN

## 2012-07-24 MED ORDER — FLUCONAZOLE 150 MG PO TABS: 150.0000 mg | ORAL_TABLET | Freq: Once | ORAL | Status: DC

## 2012-07-24 MED ORDER — FLUCONAZOLE 150 MG PO TABS
150.0000 mg | ORAL_TABLET | ORAL | Status: AC
  Administered 2012-07-24: 150 mg via ORAL
  Filled 2012-07-24: qty 1

## 2012-07-24 MED ORDER — CYCLOBENZAPRINE HCL 5 MG PO TABS: 5.0000 mg | ORAL_TABLET | Freq: Three times a day (TID) | ORAL | Status: DC | PRN

## 2012-07-24 NOTE — Telephone Encounter (Signed)
Spoke with the patient who called in and left a message stating she was having lower right abdominal pain, lower back pain, and pain in her bottom.  She also states she is "having leaking and doesn't know what to do."  When I called her back she reported the pain and leaking occurred after having intercourse.  She reports braxton hicks 10 mins. Apart for about an hour as well as a thin watery discharge she called "leakage."  She said she soaked through 2 pads before the discharge began to subside.  She reports the pain has subsided.  She also believes the leakage/discharge could be related to a bacterial infection she is treating.  I advised her to be evaluated in MAU.  She stated if she felt like she was having any problems she would go to MAU but at this time she is unable to go.

## 2012-07-24 NOTE — Discharge Instructions (Signed)
 Take second Diflucan tablet 1 week from now, after finishing your treatment for bacterial vaginosis.  Candida Infection, Adult A candida infection (also called yeast, fungus and Monilia infection) is an overgrowth of yeast that can occur anywhere on the body. A yeast infection commonly occurs in warm, moist body areas. Usually, the infection remains localized but can spread to become a systemic infection. A yeast infection may be a sign of a more severe disease such as diabetes, leukemia, or AIDS. A yeast infection can occur in both men and women. In women, Candida vaginitis is a vaginal infection. It is one of the most common causes of vaginitis. Men usually do not have symptoms or know they have an infection until other problems develop. Men may find out they have a yeast infection because their sex partner has a yeast infection. Uncircumcised men are more likely to get a yeast infection than circumcised men. This is because the uncircumcised glans is not exposed to air and does not remain as dry as that of a circumcised glans. Older adults may develop yeast infections around dentures. CAUSES  Women  Antibiotics.  Steroid medication taken for a long time.  Being overweight (obese).  Diabetes.  Poor immune condition.  Certain serious medical conditions.  Immune suppressive medications for organ transplant patients.  Chemotherapy.  Pregnancy.  Menstration.  Stress and fatigue.  Intravenous drug use.  Oral contraceptives.  Wearing tight-fitting clothes in the crotch area.  Catching it from a sex partner who has a yeast infection.  Spermicide.  Intravenous, urinary, or other catheters. Men  Catching it from a sex partner who has a yeast infection.  Having oral or anal sex with a person who has the infection.  Spermicide.  Diabetes.  Antibiotics.  Poor immune system.  Medications that suppress the immune system.  Intravenous drug use.  Intravenous, urinary, or  other catheters. SYMPTOMS  Women  Thick, white vaginal discharge.  Vaginal itching.  Redness and swelling in and around the vagina.  Irritation of the lips of the vagina and perineum.  Blisters on the vaginal lips and perineum.  Painful sexual intercourse.  Low blood sugar (hypoglycemia).  Painful urination.  Bladder infections.  Intestinal problems such as constipation, indigestion, bad breath, bloating, increase in gas, diarrhea, or loose stools. Men  Men may develop intestinal problems such as constipation, indigestion, bad breath, bloating, increase in gas, diarrhea, or loose stools.  Dry, cracked skin on the penis with itching or discomfort.  Jock itch.  Dry, flaky skin.  Athlete's foot.  Hypoglycemia. DIAGNOSIS  Women  A history and an exam are performed.  The discharge may be examined under a microscope.  A culture may be taken of the discharge. Men  A history and an exam are performed.  Any discharge from the penis or areas of cracked skin will be looked at under the microscope and cultured.  Stool samples may be cultured. TREATMENT  Women  Vaginal antifungal suppositories and creams.  Medicated creams to decrease irritation and itching on the outside of the vagina.  Warm compresses to the perineal area to decrease swelling and discomfort.  Oral antifungal medications.  Medicated vaginal suppositories or cream for repeated or recurrent infections.  Wash and dry the irritation areas before applying the cream.  Eating yogurt with lactobacillus may help with prevention and treatment.  Sometimes painting the vagina with gentian violet solution may help if creams and suppositories do not work. Men  Antifungal creams and oral antifungal medications.  Sometimes  treatment must continue for 30 days after the symptoms go away to prevent recurrence. HOME CARE INSTRUCTIONS  Women  Use cotton underwear and avoid tight-fitting clothing.  Avoid  colored, scented toilet paper and deodorant tampons or pads.  Do not douche.  Keep your diabetes under control.  Finish all the prescribed medications.  Keep your skin clean and dry.  Consume milk or yogurt with lactobacillus active culture regularly. If you get frequent yeast infections and think that is what the infection is, there are over-the-counter medications that you can get. If the infection does not show healing in 3 days, talk to your caregiver.  Tell your sex partner you have a yeast infection. Your partner may need treatment also, especially if your infection does not clear up or recurs. Men  Keep your skin clean and dry.  Keep your diabetes under control.  Finish all prescribed medications.  Tell your sex partner that you have a yeast infection so they can be treated if necessary. SEEK MEDICAL CARE IF:   Your symptoms do not clear up or worsen in one week after treatment.  You have an oral temperature above 102 F (38.9 C).  You have trouble swallowing or eating for a prolonged time.  You develop blisters on and around your vagina.  You develop vaginal bleeding and it is not your menstrual period.  You develop abdominal pain.  You develop intestinal problems as mentioned above.  You get weak or lightheaded.  You have painful or increased urination.  You have pain during sexual intercourse. MAKE SURE YOU:   Understand these instructions.  Will watch your condition.  Will get help right away if you are not doing well or get worse. Document Released: 05/20/2004 Document Revised: 07/05/2011 Document Reviewed: 09/01/2009 [facility] Patient Information 2013 [place], [place].

## 2012-07-24 NOTE — MAU Provider Note (Signed)
Chief Complaint:  Abdominal Pain, Vaginal Pain and Rupture of Membranes   First Provider Initiated Contact with Patient 07/24/12 1428      HPI: Angie [name] is a 47 y.o. [phone] at 35w3dwho presents to maternity admissions reporting back pain described as constant, RLQ pain which is sharp and intermittent and increases with movement, LLQ pain yesterday similar to the RLQ pain today, and leakage of clear fluid requiring panty liners but not enough for large pad.  She has some burning with the discharge.  She was treated recently for BV and feels the symptoms have never gone away.  She reports good fetal movement, denies vaginal bleeding, urinary symptoms, h/a, dizziness, n/v, or fever/chills.     Past Medical History: Past Medical History  Diagnosis Date  . Chronic pain   . Bulging lumbar disc   . Abnormal Pap smear   . Anxiety     Takes xanax  . Arthritis     Has rx for percocet  . Migraines   . Anemia     Past obstetric history: OB History   Grav Para Term Preterm Abortions TAB SAB Ect Mult Living   6 3 3  0 2  2   3      # Outc Date GA Lbr Len/2nd Wgt Sex Del Anes PTL Lv   1 TRM 9/93 [redacted]w[redacted]d  2.92kg(6lb7oz) M LTCS   Yes   2 TRM 12/96   4.508kg(9lb15oz) F LTCS   Yes   3 TRM 12/98   2.75kg(6lb1oz) F LTCS   Yes   4 SAB            5 SAB            6 CUR               Past Surgical History: Past Surgical History  Procedure Laterality Date  . Cesarean section      X 3  . Cervical cone biopsy    . Gastric bypass  2007    Family History: Family History  Problem Relation Age of Onset  . Diabetes Father   . Depression Maternal Grandmother   . Heart disease Maternal Grandfather   . Depression Paternal Grandmother     Social History: History  Substance Use Topics  . Smoking status: Former Smoker -- 0.50 packs/day for 10 years    Types: Cigarettes    Quit date: 03/16/2012  . Smokeless tobacco: Never Used  . Alcohol Use: No     Comment: Had "issues" then socially,  now none    Allergies: No Known Allergies  Meds:  Prescriptions prior to admission  Medication Sig Dispense Refill  . BIOTIN PO Take 1 tablet by mouth daily.      . butalbital-acetaminophen-caffeine (FIORICET) 50-325-40 MG per tablet Take 1-2 tablets by mouth every 6 (six) hours as needed for headache.  15 tablet  0  . Cyanocobalamin (VITAMIN B-12 PO) Take 1 tablet by mouth 2 (two) times daily.      . ferrous gluconate (FERGON) 325 MG tablet Take 325 mg by mouth 3 (three) times a week.       Marland Kitchen FLUoxetine (PROZAC) 20 MG capsule Take 1 capsule (20 mg total) by mouth daily.  30 capsule  0  . polyethylene glycol (MIRALAX / GLYCOLAX) packet Take 17 g by mouth daily as needed (constipation).       . Prenatal Vit-Fe Fumarate-FA (PRENATAL MULTIVITAMIN) TABS Take 1 tablet by mouth daily at 12 noon.  ROS: Pertinent findings in history of present illness.  Physical Exam  Blood pressure 92/51, pulse 81, temperature 97.6 F (36.4 C), temperature source Oral, resp. rate 18, height 5' (1.524 m), weight 81.647 kg (180 lb), last menstrual period 01/28/2012. GENERAL: Well-developed, well-nourished female in no acute distress.  HEENT: normocephalic HEART: normal rate RESP: normal effort ABDOMEN: Soft, non-tender, gravid appropriate for gestational age EXTREMITIES: Nontender, no edema NEURO: alert and oriented SPECULUM EXAM: NEFG, physiologic discharge, no blood, cervix clean    FHT:  Baseline 145 , moderate variability, accelerations present (10x10), no decelerations Contractions: None on toco or by palpation   Labs: Results for orders placed during the hospital encounter of 07/24/12 (from the past 24 hour(s))  URINALYSIS, ROUTINE W REFLEX MICROSCOPIC     Status: Abnormal   Collection Time    07/24/12  1:35 PM      Result Value Range   Color, Urine YELLOW  YELLOW   APPearance CLEAR  CLEAR   Specific Gravity, Urine 1.015  1.005 - 1.030   pH 7.0  5.0 - 8.0   Glucose, UA NEGATIVE   NEGATIVE mg/dL   Hgb urine dipstick NEGATIVE  NEGATIVE   Bilirubin Urine NEGATIVE  NEGATIVE   Ketones, ur 15 (*) NEGATIVE mg/dL   Protein, ur NEGATIVE  NEGATIVE mg/dL   Urobilinogen, UA 1.0  0.0 - 1.0 mg/dL   Nitrite NEGATIVE  NEGATIVE   Leukocytes, UA NEGATIVE  NEGATIVE  CBC     Status: Abnormal   Collection Time    07/24/12  2:15 PM      Result Value Range   WBC 6.5  4.0 - 10.5 K/uL   RBC 2.95 (*) 3.87 - 5.11 MIL/uL   Hemoglobin 8.8 (*) 12.0 - 15.0 g/dL   HCT 16.1 (*) 09.6 - 04.5 %   MCV 85.4  78.0 - 100.0 fL   MCH 29.8  26.0 - 34.0 pg   MCHC 34.9  30.0 - 36.0 g/dL   RDW 40.9  81.1 - 91.4 %   Platelets 239  150 - 400 K/uL       Assessment: 1. Anemia during pregnancy, second trimester   2. Vaginal candida   3. Back pain complicating pregnancy, second trimester     Plan: Discharge home PTL precautions Diflucan 150 mg PO x1 dose Heat, ice, rest, Tylenol for back pain F/U with prenatal visits as scheduled Return to MAU as needed     Medication List    TAKE these medications       BIOTIN PO  Take 1 tablet by mouth daily.     butalbital-acetaminophen-caffeine 50-325-40 MG per tablet  Commonly known as:  FIORICET  Take 1-2 tablets by mouth every 6 (six) hours as needed for headache.     cyclobenzaprine 5 MG tablet  Commonly known as:  FLEXERIL  Take 1 tablet (5 mg total) by mouth 3 (three) times daily as needed for muscle spasms.     ferrous gluconate 325 MG tablet  Commonly known as:  FERGON  Take 325 mg by mouth 3 (three) times a week.     fluconazole 150 MG tablet  Commonly known as:  DIFLUCAN  Take 1 tablet (150 mg total) by mouth once.     FLUoxetine 20 MG capsule  Commonly known as:  PROZAC  Take 1 capsule (20 mg total) by mouth daily.     polyethylene glycol packet  Commonly known as:  MIRALAX / GLYCOLAX  Take 17 g by mouth daily  as needed (constipation).     prenatal multivitamin Tabs  Take 1 tablet by mouth daily at 12 noon.     VITAMIN  B-12 PO  Take 1 tablet by mouth 2 (two) times daily.         Sharen Counter Certified Nurse-Midwife 07/24/2012 2:57 PM

## 2012-07-24 NOTE — MAU Note (Signed)
?  LOF since yesterday, after intercourse, continues leaking today, 3rd pad since last night @ 2200, denies bleeding.  Pt C/O RLQ & back pain since last night, also vaginal pain.

## 2012-07-24 NOTE — Telephone Encounter (Signed)
Patient called in and reported lower right abdominal pain, lower back pain, and pain in her bottom.  On her message she reported the pain in her bottom has subsided.  She reports "having some leakage and I don't know what to do."  Upon speaking with the patient she reports the pain is much better and occurred shortly after having intercourse.  She reports braxton hicks every 10 mins for an hour and now those are subsiding too.  I asked about her leakage and she reports that she soaked 2 pads but that it is also subsiding. She reports the discharge as thin and watery.  I advised her, because of the leakage, to be evaluated by MAU today.  The patient is not sure if she will make it in to be evaluated.  Her next appointment in the clinic 08/07/12.

## 2012-07-25 LAB — GC/CHLAMYDIA PROBE AMP
CT Probe RNA: NEGATIVE
GC Probe RNA: NEGATIVE

## 2012-08-02 ENCOUNTER — Ambulatory Visit (HOSPITAL_COMMUNITY)
Admission: RE | Admit: 2012-08-02 | Discharge: 2012-08-02 | Disposition: A | Discharge status: Home / Self Care | Payer: Medicare HMO | Source: Ambulatory Visit | Attending: Family Medicine | Admitting: Family Medicine

## 2012-08-02 DIAGNOSIS — O0930 Supervision of pregnancy with insufficient antenatal care, unspecified trimester: Secondary | ICD-10-CM | POA: Insufficient documentation

## 2012-08-02 DIAGNOSIS — Z3689 Encounter for other specified antenatal screening: Secondary | ICD-10-CM | POA: Insufficient documentation

## 2012-08-02 DIAGNOSIS — O09522 Supervision of elderly multigravida, second trimester: Secondary | ICD-10-CM

## 2012-08-02 DIAGNOSIS — O09529 Supervision of elderly multigravida, unspecified trimester: Secondary | ICD-10-CM | POA: Insufficient documentation

## 2012-08-02 DIAGNOSIS — O99012 Anemia complicating pregnancy, second trimester: Secondary | ICD-10-CM

## 2012-08-02 DIAGNOSIS — O34219 Maternal care for unspecified type scar from previous cesarean delivery: Secondary | ICD-10-CM | POA: Insufficient documentation

## 2012-08-07 ENCOUNTER — Other Ambulatory Visit: Payer: Self-pay | Admitting: Obstetrics & Gynecology

## 2012-08-07 ENCOUNTER — Ambulatory Visit (INDEPENDENT_AMBULATORY_CARE_PROVIDER_SITE_OTHER): Payer: Medicare HMO | Admitting: Obstetrics & Gynecology

## 2012-08-07 VITALS — BP 114/69 | Wt 182.3 lb

## 2012-08-07 DIAGNOSIS — O0930 Supervision of pregnancy with insufficient antenatal care, unspecified trimester: Secondary | ICD-10-CM

## 2012-08-07 DIAGNOSIS — O09529 Supervision of elderly multigravida, unspecified trimester: Secondary | ICD-10-CM

## 2012-08-07 DIAGNOSIS — O09523 Supervision of elderly multigravida, third trimester: Secondary | ICD-10-CM

## 2012-08-07 DIAGNOSIS — R519 Headache, unspecified: Secondary | ICD-10-CM

## 2012-08-07 DIAGNOSIS — O26893 Other specified pregnancy related conditions, third trimester: Secondary | ICD-10-CM

## 2012-08-07 DIAGNOSIS — O0933 Supervision of pregnancy with insufficient antenatal care, third trimester: Secondary | ICD-10-CM

## 2012-08-07 DIAGNOSIS — O9989 Other specified diseases and conditions complicating pregnancy, childbirth and the puerperium: Secondary | ICD-10-CM

## 2012-08-07 LAB — POCT URINALYSIS DIP (DEVICE)
Bilirubin Urine: NEGATIVE
Glucose, UA: NEGATIVE mg/dL
Hgb urine dipstick: NEGATIVE
Ketones, ur: NEGATIVE mg/dL
Leukocytes, UA: NEGATIVE
Nitrite: NEGATIVE
Protein, ur: NEGATIVE mg/dL
Specific Gravity, Urine: 1.025 (ref 1.005–1.030)
Urobilinogen, UA: 0.2 mg/dL (ref 0.0–1.0)
pH: 6.5 (ref 5.0–8.0)

## 2012-08-07 MED ORDER — PRENATAL MULTIVITAMIN CH: 1.0000 | ORAL_TABLET | Freq: Every day | ORAL | Status: DC

## 2012-08-07 MED ORDER — BUTALBITAL-APAP-CAFFEINE 50-325-40 MG PO TABS: 1.0000 | ORAL_TABLET | Freq: Four times a day (QID) | ORAL | Status: DC | PRN

## 2012-08-07 NOTE — Patient Instructions (Signed)
Tetanus, Diphtheria (Td) or Tetanus, Diphtheria, Pertussis (Tdap) Vaccine  What You Need to Know  WHY GET VACCINATED?  Tetanus, diphtheria and pertussis can be very serious diseases.  TETANUS (Lockjaw) causes painful muscle spasms and stiffness, usually all over the body.  · Tetanus can lead to tightening of muscles in the head and neck so the victim cannot open his mouth or swallow, or sometimes even breathe. Tetanus kills about 1 out of 5 people who are infected.  DIPHTHERIA can cause a thick membrane to cover the back of the throat.  · Diphtheria can lead to breathing problems, paralysis, heart failure, and even death.  PERTUSSIS (Whooping Cough) causes severe coughing spells which can lead to difficulty breathing, vomiting, and disturbed sleep.  · Pertussis can lead to weight loss, incontinence, rib fractures and passing out from violent coughing. Up to 2 in 100 adolescents and 5 in 100 adults with pertussis are hospitalized or have complications, including pneumonia and death.  These 3 diseases are all caused by bacteria. Diphtheria and pertussis are spread from person to person. Tetanus enters the body through cuts, scratches, or wounds.  The [place] saw as many as 200,000 cases a year of diphtheria and pertussis before vaccines were available, and hundreds of cases of tetanus. Since then, tetanus and diphtheria cases have dropped by about 99% and pertussis cases by about 92%.  Children 6 years of age and younger get DTaP vaccine to protect them from these three diseases. But older children, adolescents, and adults need protection too.  VACCINES FOR ADOLESCENTS AND ADULTS: TD AND TDAP  Two vaccines are available to protect people 7 years of age and older from these diseases:  · Td vaccine has been used for many years. It protects against tetanus and diphtheria.  · Tdap vaccine was licensed in 2005. It is the first vaccine for adolescents and adults that protects against pertussis as well as tetanus and  diphtheria.  A Td booster dose is recommended every 10 years. Tdap is given only once.  WHICH VACCINE, AND WHEN?  Ages 7 through 18 years  · A dose of Tdap is recommended at age 11 or 12. This dose could be given as early as age 7 for children who missed one or more childhood doses of DTaP.  · Children and adolescents who did not get a complete series of DTaP shots by age 7 should complete the series using a combination of Td and Tdap.  Age 19 years and Older  · All adults should get a booster dose of Td every 10 years. Adults under 65 who have never gotten Tdap should get a dose of Tdap as their next booster dose. Adults 65 and older may get one booster dose of Tdap.  · Adults (including women who may become pregnant and adults 65 and older) who expect to have close contact with a baby younger than 12 months of age should get a dose of Tdap to help protect the baby from pertussis.  · Healthcare professionals who have direct patient contact in hospitals or clinics should get one dose of Tdap.  Protection After a Wound  · A person who gets a severe cut or burn might need a dose of Td or Tdap to prevent tetanus infection. Tdap should be used for anyone who has never had a dose previously. Td should be used if Tdap is not available, or for:  · Anybody who has already had a dose of Tdap.  · Children 7   through 9 years of age who completed the childhood DTaP series.  · Adults 65 and older.  Pregnant Women  · Pregnant women who have never had a dose of Tdap should get one, after the 20th week of gestation and preferably during the 3rd trimester. If they do not get Tdap during their pregnancy they should get a dose as soon as possible after delivery. Pregnant women who have previously received Tdap and need tetanus or diphtheria vaccine while pregnant should get Td.  Tdap and Td may be given at the same time as other vaccines.  SOME PEOPLE SHOULD NOT BE VACCINATED OR SHOULD WAIT  · Anyone who has had a life-threatening  allergic reaction after a dose of any tetanus, diphtheria, or pertussis containing vaccine should not get Td or Tdap.  · Anyone who has a severe allergy to any component of a vaccine should not get that vaccine. Tell your doctor if the person getting the vaccine has any severe allergies.  · Anyone who had a coma, or long or multiple seizures within 7 days after a dose of DTP or DTaP should not get Tdap, unless a cause other than the vaccine was found. These people may get Td.  · Talk to your doctor if the person getting either vaccine:  · Has epilepsy or another nervous system problem.  · Had severe swelling or severe pain after a previous dose of DTP, DTaP, DT, Td, or Tdap vaccine.  · Has had Guillain Barré Syndrome (GBS).  Anyone who has a moderate or severe illness on the day the shot is scheduled should usually wait until they recover before getting Tdap or Td vaccine. A person with a mild illness or low fever can usually be vaccinated.  WHAT ARE THE RISKS FROM TDAP AND TD VACCINES?  With a vaccine, as with any medicine, there is always a small risk of a life-threatening allergic reaction or other serious problem.  Brief fainting spells and related symptoms (such as jerking movements) can happen after any medical procedure, including vaccination. Sitting or lying down for about 15 minutes after a vaccination can help prevent fainting and injuries caused by falls. Tell your doctor if the patient feels dizzy or lightheaded, or has vision changes or ringing in the ears.  Getting tetanus, diphtheria, or pertussis would be much more likely to lead to severe problems than getting either Td or Tdap vaccine.  Problems reported after Td and Tdap vaccines are listed below.  Mild Problems (noticeable, but did not interfere with activities)  Tdap  · Pain (about 3 in 4 adolescents and 2 in 3 adults).  · Redness or swelling (about 1 in 5).  · Mild fever of at least 100.4° F (38° C) (up to about 1 in 25 adolescents and 1 in  100 adults).  · Headache (about 4 in 10 adolescents and 3 in 10 adults).  · Tiredness (about 1 in 3 adolescents and 1 in 4 adults).  · Nausea, vomiting, diarrhea, or stomach ache (up to 1 in 4 adolescents and 1 in 10 adults).  · Chills, body aches, sore joints, rash, or swollen glands (uncommon).  Td  · Pain (up to about 8 in 10).  · Redness or swelling at the injection site (up to about 1 in 3).  · Mild fever (up to about 1 in 15).  · Headache or tiredness (uncommon).  Moderate Problems (interfered with activities, but did not require medical attention)  Tdap  · Pain at the   injection site (about 1 in 20 adolescents and 1 in 100 adults).  · Redness or swelling at the injection site (up to about 1 in 16 adolescents and 1 in 25 adults).  · Fever over 102° F (38.9° C) (about 1 in 100 adolescents and 1 in 250 adults).  · Headache (1 in 300).  · Nausea, vomiting, diarrhea, or stomach ache (up to 3 in 100 adolescents and 1 in 100 adults).  Td  · Fever over 102° F (38.9° C) (rare).  Tdap or Td  · Extensive swelling of the arm where the shot was given (up to about 3 in 100).  Severe Problems (Unable to perform usual activities; required medical attention)  Tdap or Td  · Swelling, severe pain, bleeding, and redness in the arm where the shot was given (rare).  A severe allergic reaction could occur after any vaccine. They are estimated to occur less than once in a million doses.  WHAT IF THERE IS A SEVERE REACTION?  What should I look for?  Any unusual condition, such as a severe allergic reaction or a high fever. If a severe allergic reaction occurred, it would be within a few minutes to an hour after the shot. Signs of a serious allergic reaction can include difficulty breathing, weakness, hoarseness or wheezing, a fast heartbeat, hives, dizziness, paleness, or swelling of the throat.  What should I do?  · Call a doctor, or get the person to a doctor right away.  · Tell your doctor what happened, the date and time it  happened, and when the vaccination was given.  · Ask your provider to report the reaction by filing a Vaccine Adverse Event Reporting System (VAERS) form. Or, you can file this report through the VAERS website at www.vaers.hhs.gov or by calling 1-800-822-7967.  VAERS does not provide medical advice.  THE NATIONAL VACCINE INJURY COMPENSATION PROGRAM  The National Vaccine Injury Compensation Program (VICP) was created in 1986.  Persons who believe they may have been injured by a vaccine can learn about the program and about filing a claim by calling 1-800-338-2382 or visiting the VICP website at www.hrsa.gov/vaccinecompensation.  HOW CAN I LEARN MORE?  · Your doctor can give you the vaccine package insert or suggest other sources of information.  · Call your local or state health department.  · Contact the Centers for Disease Control and Prevention (CDC):  · Call 1-800-232-4636 (1-800-CDC-INFO).  · Visit the CDC website at www.cdc.gov/vaccines.  CDC Td and Tdap Interim VIS (05/19/10)  Document Released: 02/07/2006 Document Revised: 07/05/2011 Document Reviewed: 05/19/2010  ExitCare® Patient Information ©2013 [employer].

## 2012-08-07 NOTE — Progress Notes (Signed)
Pulse: 97 Pt not prepared to do glucose test today. States that she can come back next week for labs only.  Needs refill on Fioricet and pnv

## 2012-08-07 NOTE — Progress Notes (Signed)
08/02/12 scan showed EFW 45%, normal AFV.  Third trimester labs, possible TdaP next visit. No other complaints or concerns.  Fetal movement and labor precautions reviewed.

## 2012-08-21 ENCOUNTER — Encounter: Payer: Medicare HMO | Admitting: Obstetrics and Gynecology

## 2012-08-21 ENCOUNTER — Other Ambulatory Visit: Payer: Self-pay | Admitting: Obstetrics and Gynecology

## 2012-08-21 ENCOUNTER — Ambulatory Visit (INDEPENDENT_AMBULATORY_CARE_PROVIDER_SITE_OTHER): Payer: Medicare HMO | Admitting: Obstetrics and Gynecology

## 2012-08-21 VITALS — BP 107/70 | Temp 98.5°F | Wt 174.0 lb

## 2012-08-21 DIAGNOSIS — O0932 Supervision of pregnancy with insufficient antenatal care, second trimester: Secondary | ICD-10-CM

## 2012-08-21 DIAGNOSIS — O0930 Supervision of pregnancy with insufficient antenatal care, unspecified trimester: Secondary | ICD-10-CM

## 2012-08-21 DIAGNOSIS — N898 Other specified noninflammatory disorders of vagina: Secondary | ICD-10-CM

## 2012-08-21 LAB — POCT URINALYSIS DIP (DEVICE)
Bilirubin Urine: NEGATIVE
Glucose, UA: NEGATIVE mg/dL
Hgb urine dipstick: NEGATIVE
Ketones, ur: NEGATIVE mg/dL
Nitrite: NEGATIVE
Protein, ur: 30 mg/dL — AB
Specific Gravity, Urine: 1.025 (ref 1.005–1.030)
Urobilinogen, UA: 1 mg/dL (ref 0.0–1.0)
pH: 7 (ref 5.0–8.0)

## 2012-08-21 LAB — CBC
HCT: 26.8 % — ABNORMAL LOW (ref 36.0–46.0)
Hemoglobin: 8.8 g/dL — ABNORMAL LOW (ref 12.0–15.0)
MCH: 28.6 pg (ref 26.0–34.0)
MCHC: 32.8 g/dL (ref 30.0–36.0)
MCV: 87 fL (ref 78.0–100.0)
Platelets: 303 10*3/uL (ref 150–400)
RBC: 3.08 MIL/uL — ABNORMAL LOW (ref 3.87–5.11)
RDW: 13.7 % (ref 11.5–15.5)
WBC: 6.6 10*3/uL (ref 4.0–10.5)

## 2012-08-21 NOTE — Progress Notes (Signed)
Hgb 8.8> on PNV, iron bid, b12, biotin. C/o vaginal discharge and external itching.  WP done.  Concerned with ears popping x 1 month. No other URI sx. Or allergies.  Left> grey TM, no LR:  serous otitis> rec Sudafed. Proteinuria> urine C&S

## 2012-08-21 NOTE — Progress Notes (Signed)
Patient concerned she could have a yeast or bladder infection.   She no longer has a discharge but she does still have itching.

## 2012-08-21 NOTE — Patient Instructions (Addendum)
Serous Otitis Media    Serous otitis media is also known as otitis media with effusion (OME). It means there is fluid in the middle ear space. This space contains the bones for hearing and air. Air in the middle ear space helps to transmit sound.    The air gets there through the eustachian tube. This tube goes from the back of the throat to the middle ear space. It keeps the pressure in the middle ear the same as the outside world. It also helps to drain fluid from the middle ear space.  CAUSES    OME occurs when the eustachian tube gets blocked. Blockage can come from:   Ear infections.   Colds and other upper respiratory infections.   Allergies.   Irritants such as cigarette smoke.   Sudden changes in air pressure (such as descending in an airplane).   Enlarged adenoids.  During colds and upper respiratory infections, the middle ear space can become temporarily filled with fluid. This can happen after an ear infection also. Once the infection clears, the fluid will generally drain out of the ear through the eustachian tube. If it does not, then OME occurs.  SYMPTOMS     Hearing loss.   A feeling of fullness in the ear  but no pain.   Young children may not show any symptoms.  DIAGNOSIS     Diagnosis of OME is made by an ear exam.   Tests may be done to check on the movement of the eardrum.   Hearing exams may be done.  TREATMENT     The fluid most often goes away without treatment.   If allergy is the cause, allergy treatment may be helpful.   Fluid that persists for several months may require minor surgery. A small tube is placed in the ear drum to:   Drain the fluid.   Restore the air in the middle ear space.   In certain situations, antibiotics are used to avoid surgery.   Surgery may be done to remove enlarged adenoids (if this is the cause).  HOME CARE INSTRUCTIONS     Keep children away from tobacco smoke.   Be sure to keep follow up appointments, if any.  SEEK MEDICAL CARE IF:      Hearing is not better in 3 months.   Hearing is worse.   Ear pain.   Drainage from the ear.   Dizziness.  Document Released: 07/03/2003 Document Revised: 07/05/2011 Document Reviewed: 05/02/2008  ExitCare Patient Information 2013 [employer].

## 2012-08-22 LAB — RPR

## 2012-08-22 LAB — WET PREP, GENITAL: Trich, Wet Prep: NONE SEEN

## 2012-08-22 LAB — HIV ANTIBODY (ROUTINE TESTING W REFLEX): HIV: NONREACTIVE

## 2012-08-22 LAB — GLUCOSE TOLERANCE, 1 HOUR (50G) W/O FASTING: Glucose, 1 Hour GTT: 75 mg/dL (ref 70–140)

## 2012-08-24 LAB — CULTURE, OB URINE
Colony Count: NO GROWTH
Organism ID, Bacteria: NO GROWTH

## 2012-09-04 ENCOUNTER — Telehealth: Payer: Self-pay

## 2012-09-04 ENCOUNTER — Ambulatory Visit (INDEPENDENT_AMBULATORY_CARE_PROVIDER_SITE_OTHER): Payer: Medicare HMO | Admitting: Family

## 2012-09-04 ENCOUNTER — Other Ambulatory Visit: Payer: Self-pay | Admitting: Family

## 2012-09-04 VITALS — BP 97/70 | Temp 96.7°F | Wt 179.1 lb

## 2012-09-04 DIAGNOSIS — O9989 Other specified diseases and conditions complicating pregnancy, childbirth and the puerperium: Secondary | ICD-10-CM

## 2012-09-04 DIAGNOSIS — O09529 Supervision of elderly multigravida, unspecified trimester: Secondary | ICD-10-CM

## 2012-09-04 DIAGNOSIS — O0930 Supervision of pregnancy with insufficient antenatal care, unspecified trimester: Secondary | ICD-10-CM

## 2012-09-04 DIAGNOSIS — O26893 Other specified pregnancy related conditions, third trimester: Secondary | ICD-10-CM

## 2012-09-04 DIAGNOSIS — O34219 Maternal care for unspecified type scar from previous cesarean delivery: Secondary | ICD-10-CM

## 2012-09-04 LAB — POCT URINALYSIS DIP (DEVICE)
Bilirubin Urine: NEGATIVE
Glucose, UA: NEGATIVE mg/dL
Ketones, ur: NEGATIVE mg/dL
Nitrite: NEGATIVE
Protein, ur: NEGATIVE mg/dL
Specific Gravity, Urine: 1.02 (ref 1.005–1.030)
Urobilinogen, UA: 0.2 mg/dL (ref 0.0–1.0)
pH: 7 (ref 5.0–8.0)

## 2012-09-04 MED ORDER — CEPHALEXIN 500 MG PO CAPS: 500.0000 mg | ORAL_CAPSULE | Freq: Three times a day (TID) | ORAL | Status: DC

## 2012-09-04 MED ORDER — NITROFURANTOIN MONOHYD MACRO 100 MG PO CAPS: 100.0000 mg | ORAL_CAPSULE | Freq: Two times a day (BID) | ORAL | Status: DC

## 2012-09-04 NOTE — Progress Notes (Signed)
RX macrobid, urine culture; no questions or concerns.

## 2012-09-04 NOTE — Progress Notes (Signed)
Pt unable to afford Macrobid > urine culture pending > RX sent for Keflex 500 mg TID x 7 days. Pt notified.

## 2012-09-04 NOTE — Telephone Encounter (Signed)
Pt called and stated that she was prescribed an antibiotic and she can not afford it.  Could something be called in? Re:  Routed to [facility] for advisement

## 2012-09-06 ENCOUNTER — Encounter: Payer: Self-pay | Admitting: Family

## 2012-09-06 ENCOUNTER — Telehealth: Payer: Self-pay | Admitting: *Deleted

## 2012-09-06 LAB — CULTURE, OB URINE: Colony Count: >=100000

## 2012-09-06 NOTE — Telephone Encounter (Signed)
Angie [name] called and left a message stating she is [redacted] weeks pregnant, 47 years old and is having a lot of problems, can't hardly breathe or move and wants to talk to a doctor and find out when the earliest she can have a c/section is. Also states she is hurting terrible.  Per chart review hx gastric bypass, anemia, back pain, c/s, arthritis. Had ob fu 09/04/12. Called [name] and she states she was fine 09/04/12 but now the baby has moved up and it is harder for her to breathe- states she is only 51ft11in and the baby is big and she feels so full and back hurts worse now that pregnant and can't do anything and hurts when baby moves.  We discussed that to schedule a c/section before 39 weeks would need a medical reason- and I would discuss her complaints with the doctor. Patient denies that she a respiratory illness- just that baby is so high makes it hard to breathe.

## 2012-09-06 NOTE — Telephone Encounter (Signed)
Discussed patient complaints with Dr. Debroah Loop and instructed to move her next ob fu to within the next week and go to MAU if she needs to before that.  Called [facility] and offered her an appointment for tomorrow but patient states she can't come tomorrow , gave her 09/11/12 at 0930 appointment and she states she can come then. We discussed she can go to MAU if needed if her symptoms worsen - MAU open 24/7. Patient voices understanding.

## 2012-09-07 ENCOUNTER — Encounter: Payer: Medicare HMO | Admitting: Obstetrics & Gynecology

## 2012-09-11 ENCOUNTER — Encounter: Payer: Medicare HMO | Admitting: Obstetrics and Gynecology

## 2012-09-22 ENCOUNTER — Inpatient Hospital Stay (HOSPITAL_COMMUNITY)
Admission: AD | Admit: 2012-09-22 | Discharge: 2012-09-22 | Disposition: A | Discharge status: Home / Self Care | Payer: Medicare HMO | Source: Ambulatory Visit | Attending: Obstetrics & Gynecology | Admitting: Obstetrics & Gynecology

## 2012-09-22 ENCOUNTER — Encounter (HOSPITAL_COMMUNITY): Payer: Self-pay | Admitting: *Deleted

## 2012-09-22 DIAGNOSIS — O99019 Anemia complicating pregnancy, unspecified trimester: Secondary | ICD-10-CM

## 2012-09-22 DIAGNOSIS — O99012 Anemia complicating pregnancy, second trimester: Secondary | ICD-10-CM

## 2012-09-22 DIAGNOSIS — R109 Unspecified abdominal pain: Secondary | ICD-10-CM | POA: Insufficient documentation

## 2012-09-22 DIAGNOSIS — M545 Low back pain, unspecified: Secondary | ICD-10-CM | POA: Insufficient documentation

## 2012-09-22 DIAGNOSIS — B373 Candidiasis of vulva and vagina: Secondary | ICD-10-CM | POA: Insufficient documentation

## 2012-09-22 DIAGNOSIS — B379 Candidiasis, unspecified: Secondary | ICD-10-CM

## 2012-09-22 DIAGNOSIS — N949 Unspecified condition associated with female genital organs and menstrual cycle: Secondary | ICD-10-CM | POA: Insufficient documentation

## 2012-09-22 DIAGNOSIS — B3731 Acute candidiasis of vulva and vagina: Secondary | ICD-10-CM | POA: Insufficient documentation

## 2012-09-22 DIAGNOSIS — O2390 Unspecified genitourinary tract infection in pregnancy, unspecified trimester: Secondary | ICD-10-CM | POA: Insufficient documentation

## 2012-09-22 LAB — WET PREP, GENITAL
Clue Cells Wet Prep HPF POC: NONE SEEN
Trich, Wet Prep: NONE SEEN

## 2012-09-22 LAB — URINE MICROSCOPIC-ADD ON

## 2012-09-22 LAB — URINALYSIS, ROUTINE W REFLEX MICROSCOPIC
Bilirubin Urine: NEGATIVE
Glucose, UA: NEGATIVE mg/dL
Ketones, ur: NEGATIVE mg/dL
Nitrite: NEGATIVE
Protein, ur: NEGATIVE mg/dL
Specific Gravity, Urine: 1.015 (ref 1.005–1.030)
Urobilinogen, UA: 0.2 mg/dL (ref 0.0–1.0)
pH: 6 (ref 5.0–8.0)

## 2012-09-22 MED ORDER — FLUCONAZOLE 150 MG PO TABS
150.0000 mg | ORAL_TABLET | Freq: Every day | ORAL | Status: DC
  Administered 2012-09-22: 150 mg via ORAL
  Filled 2012-09-22: qty 1

## 2012-09-22 MED ORDER — FLUCONAZOLE 150 MG PO TABS: 150.0000 mg | ORAL_TABLET | Freq: Every day | ORAL | Status: DC

## 2012-09-22 NOTE — MAU Provider Note (Addendum)
History     CSN: [id number]  Arrival date and time: 09/22/12 5784   First Provider Initiated Contact with Patient 09/22/12 [id number]      Chief Complaint  Patient presents with  . Abdominal Pain  . Vaginal Discharge   HPI Angie [name] is a 47 y.o. [id number] at [redacted]w[redacted]d who presents with vaginal discharge.   Patient was recently treated for UTI.  Patient now reporting clear vaginal discharge.   Itching/burning associated with vaginal discharge.  She reports some associated odor.  She also notes that she has been have to wear pads given the amount of discharge.  She is concerned that she may be leaking fluid.  Patient also reports bilateral hip and lower back pain, currently 6/10 in severity.  She reports intermittent/irregular contractions. Denies vaginal bleeding.   Past Medical History  Diagnosis Date  . Chronic pain   . Bulging lumbar disc   . Abnormal Pap smear   . Anxiety     Takes xanax  . Arthritis     Has rx for percocet  . Migraines   . Anemia     Past Surgical History  Procedure Laterality Date  . Cesarean section      X 3  . Cervical cone biopsy    . Gastric bypass  2007    Family History  Problem Relation Age of Onset  . Diabetes Father   . Depression Maternal Grandmother   . Heart disease Maternal Grandfather   . Depression Paternal Grandmother     History  Substance Use Topics  . Smoking status: Former Smoker -- 0.50 packs/day for 10 years    Types: Cigarettes    Quit date: 03/16/2012  . Smokeless tobacco: Never Used  . Alcohol Use: No     Comment: Had "issues" then socially, now none    Allergies: No Known Allergies  Prescriptions prior to admission  Medication Sig Dispense Refill  . BIOTIN PO Take 1 tablet by mouth daily.      . butalbital-acetaminophen-caffeine (FIORICET) 50-325-40 MG per tablet Take 1-2 tablets by mouth every 6 (six) hours as needed for headache.  15 tablet  3  . Cyanocobalamin (VITAMIN B-12 PO) Take 1 tablet by mouth 2  (two) times daily.      . ferrous sulfate 325 (65 FE) MG tablet Take 325 mg by mouth 3 (three) times a week. Monday, Wednesday, friday      . FLUoxetine (PROZAC) 20 MG capsule Take 1 capsule (20 mg total) by mouth daily.  30 capsule  0  . polyethylene glycol (MIRALAX / GLYCOLAX) packet Take 17 g by mouth daily as needed (constipation).       . Prenatal Vit-Fe Fumarate-FA (PRENATAL MULTIVITAMIN) TABS Take 1 tablet by mouth daily.  30 tablet  5    Review of Systems  Constitutional: Negative for fever and chills.  Eyes: Negative for blurred vision.  Respiratory: Positive for shortness of breath.   Cardiovascular: Negative for chest pain and leg swelling.  Gastrointestinal: Positive for nausea. Negative for vomiting and abdominal pain.  Genitourinary: Positive for dysuria.    Physical Exam   Blood pressure 111/64, pulse 89, temperature 97.6 F (36.4 C), temperature source Oral, resp. rate 18, height 4' 11.25" (1.505 m), weight 81.194 kg (179 lb), last menstrual period 01/28/2012, SpO2 99.00%.  Physical Exam Gen: well appearing. NAD. Heart: RRR. No murmurs, rubs, or gallops. Lungs: CTAB. No rales, rhonchi, or wheezing. Abd: gravid but otherwise soft, nontender to palpation Ext: no  appreciable lower extremity edema bilaterally GU: normal appearing external genitalia Pelvic exam: no pooling noted in vaginal vault. White vaginal discharge noted.     FHR: baseline 145, mod variability, 15x15 accels, no decels Toco: None noted   MAU Course  Procedures  Assessment and Plan  Angie [name] is a 47 y.o. [id number] at [redacted]w[redacted]d who presents with vaginal discharge and concern for ROM. - UA positive for yeast - Wet prep and GC/Chlamydia obtained - No pooling appreciated on exam.  Fern negative.  Yeast seen on fern slide - Will treat for yeast infection and discharge home.   Everlene Other 09/22/2012, 10:00 AM   I saw and examined patient and agree with above resident note. I reviewed history,  imaging, labs, and vitals. I personally reviewed the fetal heart tracing, and it is reactive. Napoleon Form, MD

## 2012-09-22 NOTE — MAU Note (Signed)
Has been having braxton hicks.  Contractions started last night. Ongoing, uncomfortable, "baby is just so heavy".  Bad discharge, ? Leakage for past wk.  Had been treated for BV, now thinks has yeast infection, but so much leakage she is wearing a pad.

## 2012-09-22 NOTE — Discharge Instructions (Signed)
Candidal Vulvovaginitis  Candidal vulvovaginitis is an infection of the vagina and vulva. The vulva is the skin around the opening of the vagina. This may cause itching and discomfort in and around the vagina.   HOME CARE  · Only take medicine as told by your doctor.  · Do not have sex (intercourse) until the infection is healed or as told by your doctor.  · Practice safe sex.  · Tell your sex partner about your infection.  · Do not douche or use tampons.  · Wear cotton underwear. Do not wear tight pants or panty hose.  · Eat yogurt. This may help treat and prevent yeast infections.  GET HELP RIGHT AWAY IF:   · You have a fever.  · Your problems get worse during treatment or do not get better in 3 days.  · You have discomfort, irritation, or itching in your vagina or vulva area.  · You have pain after sex.  · You start to get belly (abdominal) pain.  MAKE SURE YOU:  · Understand these instructions.  · Will watch your condition.  · Will get help right away if you are not doing well or get worse.  Document Released: 07/09/2008 Document Revised: 07/05/2011 Document Reviewed: 07/09/2008  ExitCare® Patient Information ©2014 [employer].

## 2012-09-23 LAB — URINE CULTURE: Colony Count: 30000

## 2012-09-23 LAB — GC/CHLAMYDIA PROBE AMP
CT Probe RNA: NEGATIVE
GC Probe RNA: NEGATIVE

## 2012-09-23 NOTE — MAU Provider Note (Signed)
Attestation of Attending Supervision of Advanced Practitioner (CNM/NP): Evaluation and management procedures were performed by the Advanced Practitioner under my supervision and collaboration.  I have reviewed the Advanced Practitioner's note and chart, and I agree with the management and plan.  HARRAWAY-SMITH, Ka Flammer 8:35 AM

## 2012-09-25 ENCOUNTER — Encounter: Payer: Medicare HMO | Admitting: Family

## 2012-09-28 NOTE — MAU Provider Note (Signed)
I saw and examined patient and agree with above resident note. I reviewed history, imaging, labs, and vitals. I personally reviewed the fetal heart tracing, and it is reactive. Nethan Caudillo, MD  

## 2012-10-02 ENCOUNTER — Ambulatory Visit (INDEPENDENT_AMBULATORY_CARE_PROVIDER_SITE_OTHER): Payer: Medicare HMO | Admitting: Obstetrics & Gynecology

## 2012-10-02 VITALS — BP 108/69 | Wt 178.5 lb

## 2012-10-02 DIAGNOSIS — Z98891 History of uterine scar from previous surgery: Secondary | ICD-10-CM

## 2012-10-02 DIAGNOSIS — O09523 Supervision of elderly multigravida, third trimester: Secondary | ICD-10-CM

## 2012-10-02 DIAGNOSIS — O0930 Supervision of pregnancy with insufficient antenatal care, unspecified trimester: Secondary | ICD-10-CM

## 2012-10-02 DIAGNOSIS — O0933 Supervision of pregnancy with insufficient antenatal care, third trimester: Secondary | ICD-10-CM

## 2012-10-02 DIAGNOSIS — O09529 Supervision of elderly multigravida, unspecified trimester: Secondary | ICD-10-CM

## 2012-10-02 DIAGNOSIS — Z9889 Other specified postprocedural states: Secondary | ICD-10-CM

## 2012-10-02 LAB — POCT URINALYSIS DIP (DEVICE)
Bilirubin Urine: NEGATIVE
Glucose, UA: NEGATIVE mg/dL
Hgb urine dipstick: NEGATIVE
Ketones, ur: NEGATIVE mg/dL
Leukocytes, UA: NEGATIVE
Nitrite: NEGATIVE
Protein, ur: NEGATIVE mg/dL
Specific Gravity, Urine: 1.02 (ref 1.005–1.030)
Urobilinogen, UA: 1 mg/dL (ref 0.0–1.0)
pH: 7 (ref 5.0–8.0)

## 2012-10-02 NOTE — Progress Notes (Signed)
U/S scheduled 10/03/12 at 1030 am with MFM.

## 2012-10-02 NOTE — Progress Notes (Signed)
Size >> dates, will obtain ultrasound.  Surgical request sent for RCS and BTS, Medicaid papers signed today (she has Medicare, I assumed the same requirement of signed papers may apply to her).  Will start twice weekly testing at 36 weeks.  Pelvic cultures next week.  No other complaints or concerns.  Fetal movement and labor precautions reviewed.

## 2012-10-02 NOTE — Patient Instructions (Signed)
Sterilization Information, Female Female sterilization is a procedure to permanently prevent pregnancy. There are different ways to perform sterilization, but all either block or close the fallopian tubes so that your eggs cannot reach your uterus. If your egg cannot reach your uterus, sperm cannot fertilize the egg, and you cannot get pregnant.  Sterilization is performed by a surgical procedure. Sometimes these procedures are performed in a hospital while a patient is asleep. Sometimes they can be done in a clinic setting with the patient awake. The fallopian tubes can be surgically cut, tied, or sealed through a procedure called tubal ligation. The fallopian tubes can also be closed with clips or rings. Sterilization can also be done by placing a tiny coil into each fallopian tube, which causes scar tissue to grow inside the tube. The scar tissue then blocks the tubes.  Discuss sterilization with your caregiver to answer any concerns you or your partner may have. You may want to ask what type of sterilization your caregiver performs. Some caregivers may not perform all the various options. Sterilization is permanent and should only be done if you are sure you do not want children or do not want any more children. Having a sterilization reversed may not be successful.  STERILIZATION PROCEDURES  Laparoscopic sterilization. This is a surgical method performed at a time other than right after childbirth. Two incisions are made in the lower abdomen. A thin, lighted tube (laparoscope) is inserted into one of the incisions and is used to perform the procedure. The fallopian tubes are closed with a ring or a clip. An instrument that uses heat could be used to seal the tubes closed (electrocautery).   Mini-laparotomy. This is a surgical method done 1 or 2 days after giving birth. Typically, a small incision is made just below the belly button (umbilicus) and the fallopian tubes are exposed. The tubes can then be  sealed, tied, or cut.   Hysteroscopic sterilization. This is performed at a time other than right after childbirth. A tiny, spring-like coil is inserted through the cervix and uterus and placed into the fallopian tubes. The coil causes scaring and blocks the tubes. Other forms of contraception should be used for 3 months after the procedure to allow the scar tissue to form completely. Additionally, it is required hysterosalpingography be done 3 months later to ensure that the procedure was successful. Hysterosalpingography is a procedure that uses X-rays to look at your uterus and fallopian tubes after a material to make them show up better has been inserted. IS STERILIZATION SAFE? Sterilization is considered safe with very rare complications. Risks depend on the type of procedure you have. As with any surgical procedure, there are risks. Some risks of sterilization by any means include:   Bleeding.  Infection.  Reaction to anesthesia medicine.  Injury to surrounding organs. Risks specific to having hysteroscopic coils placed include:  The coils may not be placed correctly the first time.   The coils may move out of place.   The tubes may not get completely blocked after 3 months.   Injury to surrounding organs when placing the coil.  HOW EFFECTIVE IS FEMALE STERILIZATION? Sterilization is nearly 100% effective, but it can fail. Depending on the type of sterilization, the rate of failure can be as high as 3%. After hysteroscopic sterilization with placement of fallopian tube coils, you will need back-up birth control for 3 months after the procedure. Sterilization is effective for a lifetime.  BENEFITS OF STERILIZATION  It does   not affect your hormones, and therefore will not affect your menstrual periods, sexual desire, or performance.   It is effective for a lifetime.   It is safe.   You do not need to worry about getting pregnant. Keep in mind that if you had the  hysteroscopic placement procedure, you must wait 3 months after the procedure (or until your caregiver confirms) before pregnancy is not considered possible.   There are no side effects unlike other types of birth control (contraception).  DRAWBACKS OF STERILIZATION  You must be sure you do not want children or any more children. The procedure is permanent.   It does not provide protection against sexually transmitted infections (STIs).   The tubes can grow back together. If this happens, there is a risk of pregnancy. There is also an increased risk (50%) of pregnancy being an ectopic pregnancy. This is a pregnancy that happens outside of the uterus. Document Released: 09/29/2007 Document Revised: 10/12/2011 Document Reviewed: 07/29/2011 [employer] Patient Information 2014 [employer].  

## 2012-10-02 NOTE — Progress Notes (Signed)
Pulse: 67

## 2012-10-03 ENCOUNTER — Inpatient Hospital Stay (HOSPITAL_COMMUNITY)
Admission: AD | Admit: 2012-10-03 | Discharge: 2012-10-04 | Disposition: A | Discharge status: Home / Self Care | Payer: Medicare HMO | Source: Ambulatory Visit | Attending: Obstetrics & Gynecology | Admitting: Obstetrics & Gynecology

## 2012-10-03 ENCOUNTER — Ambulatory Visit (HOSPITAL_COMMUNITY)
Admission: RE | Admit: 2012-10-03 | Discharge: 2012-10-03 | Disposition: A | Discharge status: Home / Self Care | Payer: Medicare HMO | Source: Ambulatory Visit | Attending: Obstetrics & Gynecology | Admitting: Obstetrics & Gynecology

## 2012-10-03 DIAGNOSIS — Z98891 History of uterine scar from previous surgery: Secondary | ICD-10-CM

## 2012-10-03 DIAGNOSIS — O479 False labor, unspecified: Secondary | ICD-10-CM

## 2012-10-03 DIAGNOSIS — O0933 Supervision of pregnancy with insufficient antenatal care, third trimester: Secondary | ICD-10-CM

## 2012-10-03 DIAGNOSIS — O09523 Supervision of elderly multigravida, third trimester: Secondary | ICD-10-CM

## 2012-10-03 DIAGNOSIS — O0930 Supervision of pregnancy with insufficient antenatal care, unspecified trimester: Secondary | ICD-10-CM | POA: Insufficient documentation

## 2012-10-03 DIAGNOSIS — O4700 False labor before 37 completed weeks of gestation, unspecified trimester: Secondary | ICD-10-CM | POA: Insufficient documentation

## 2012-10-03 DIAGNOSIS — O34219 Maternal care for unspecified type scar from previous cesarean delivery: Secondary | ICD-10-CM | POA: Insufficient documentation

## 2012-10-03 DIAGNOSIS — O09529 Supervision of elderly multigravida, unspecified trimester: Secondary | ICD-10-CM | POA: Insufficient documentation

## 2012-10-03 DIAGNOSIS — O3660X0 Maternal care for excessive fetal growth, unspecified trimester, not applicable or unspecified: Secondary | ICD-10-CM | POA: Insufficient documentation

## 2012-10-03 NOTE — MAU Provider Note (Signed)
History     CSN: [id number]  Arrival date and time: 10/03/12 2236  HPI  47 y.o. [id number] [redacted]w[redacted]d presents with new onset of contractions earlier this evening.  Patient was brought in via EMS.  Patient with h/o 3 prior C/S.    Patient denies any vaginal bleeding or vaginal discharge.  Patient is concerned about possible fluid leakage.  Patient reports good fetal movement.    Past Medical History  Diagnosis Date  . Chronic pain   . Bulging lumbar disc   . Abnormal Pap smear   . Anxiety     Takes xanax  . Arthritis     Has rx for percocet  . Migraines   . Anemia     Past Surgical History  Procedure Laterality Date  . Cesarean section      X 3  . Cervical cone biopsy    . Gastric bypass  2007    Family History  Problem Relation Age of Onset  . Diabetes Father   . Depression Maternal Grandmother   . Heart disease Maternal Grandfather   . Depression Paternal Grandmother     History  Substance Use Topics  . Smoking status: Former Smoker -- 0.50 packs/day for 10 years    Types: Cigarettes    Quit date: 03/16/2012  . Smokeless tobacco: Never Used  . Alcohol Use: No     Comment: Had "issues" then socially, now none    Allergies: No Known Allergies  Prescriptions prior to admission  Medication Sig Dispense Refill  . BIOTIN PO Take 1 tablet by mouth daily.      . butalbital-acetaminophen-caffeine (FIORICET) 50-325-40 MG per tablet Take 1-2 tablets by mouth every 6 (six) hours as needed for headache.  15 tablet  3  . Cyanocobalamin (VITAMIN B-12 PO) Take 1 tablet by mouth 2 (two) times daily.      . ferrous sulfate 325 (65 FE) MG tablet Take 325 mg by mouth 3 (three) times a week. Monday, Wednesday, friday      . FLUoxetine (PROZAC) 20 MG capsule Take 1 capsule (20 mg total) by mouth daily.  30 capsule  0  . polyethylene glycol (MIRALAX / GLYCOLAX) packet Take 17 g by mouth daily as needed (constipation).       . Prenatal Vit-Fe Fumarate-FA (PRENATAL MULTIVITAMIN) TABS  Take 1 tablet by mouth daily.  30 tablet  5    Review of Systems  Constitutional: Negative.   Respiratory: Negative.   Cardiovascular: Negative.   Gastrointestinal: Negative.   Genitourinary:       Contractions  Skin: Negative.    Physical Exam   Last menstrual period 01/28/2012.  Physical Exam  Constitutional: She appears well-developed and well-nourished.  HENT:  Head: Normocephalic.  Neck: Neck supple.  Genitourinary: Vagina normal.  FHT - 130 bpm, + acels, moderate variability, no decels present  UC - Irregular  Cervix - Closed    MAU Course  Procedures  Sterile speculum exam performed without evidence of pooling and no ferning. Will evaluate patient on monitor.  Will plan to re-check.       Assessment and Plan  47 y.o. [id number] [redacted]w[redacted]d presents with new onset of contractions earlier this evening.  Contractions remained intermittent throughout evaluation.  Recheck cervical exam unchanged.  Patient counseled regarding labor precautions.  Will discharge home.  [facility] 10/03/2012, 10:52 PM  I have seen and examined this patient and I agree with the above. Cam Hai 12:24 AM 10/04/2012

## 2012-10-03 NOTE — Progress Notes (Signed)
Angie [name]  was seen today for an ultrasound appointment.  See full report in [facility].  Impression: Single IUP at 35 4/7 weeks Fetal growth is appropriate (79th %tile) The fetal bowel appears somewhat prominent, but no dilated loops of bowel were appreciated; likely of no clinical consequence Normal amniotic fluid volume   Recommendations: Recommend 2x weekly NSTs due to advanced maternal age > 40 Recommend delivery by EDD but not prior to 39 weeks in the absence of other complications  Alpha Gula, MD

## 2012-10-04 NOTE — Discharge Instructions (Signed)
Braxton Hicks Contractions Pregnancy is commonly associated with contractions of the uterus throughout the pregnancy. Towards the end of pregnancy (32 to 34 weeks), these contractions (Braxton Hicks) can develop more often and may become more forceful. This is not true labor because these contractions do not result in opening (dilatation) and thinning of the cervix. They are sometimes difficult to tell apart from true labor because these contractions can be forceful and people have different pain tolerances. You should not feel embarrassed if you go to the hospital with false labor. Sometimes, the only way to tell if you are in true labor is for your caregiver to follow the changes in the cervix. How to tell the difference between true and false labor:  False labor.  The contractions of false labor are usually shorter, irregular and not as hard as those of true labor.  They are often felt in the front of the lower abdomen and in the groin.  They may leave with walking around or changing positions while lying down.  They get weaker and are shorter lasting as time goes on.  These contractions are usually irregular.  They do not usually become progressively stronger, regular and closer together as with true labor.  True labor.  Contractions in true labor last 30 to 70 seconds, become very regular, usually become more intense, and increase in frequency.  They do not go away with walking.  The discomfort is usually felt in the top of the uterus and spreads to the lower abdomen and low back.  True labor can be determined by your caregiver with an exam. This will show that the cervix is dilating and getting thinner. If there are no prenatal problems or other health problems associated with the pregnancy, it is completely safe to be sent home with false labor and await the onset of true labor. HOME CARE INSTRUCTIONS   Keep up with your usual exercises and instructions.  Take medications as  directed.  Keep your regular prenatal appointment.  Eat and drink lightly if you think you are going into labor.  If BH contractions are making you uncomfortable:  Change your activity position from lying down or resting to walking/walking to resting.  Sit and rest in a tub of warm water.  Drink 2 to 3 glasses of water. Dehydration may cause B-H contractions.  Do slow and deep breathing several times an hour. SEEK IMMEDIATE MEDICAL CARE IF:   Your contractions continue to become stronger, more regular, and closer together.  You have a gushing, burst or leaking of fluid from the vagina.  An oral temperature above 102 F (38.9 C) develops.  You have passage of blood-tinged mucus.  You develop vaginal bleeding.  You develop continuous belly (abdominal) pain.  You have low back pain that you never had before.  You feel the baby's head pushing down causing pelvic pressure.  The baby is not moving as much as it used to. Document Released: 04/12/2005 Document Revised: 07/05/2011 Document Reviewed: 10/04/2008 [employer] Patient Information 2014 [employer].  

## 2012-10-05 ENCOUNTER — Encounter: Payer: Medicare HMO | Admitting: *Deleted

## 2012-10-05 NOTE — Progress Notes (Signed)
Erroneous encounter- pt will not need NST until 6/16 as scheduled. She was informed and voiced understanding.

## 2012-10-05 NOTE — MAU Provider Note (Signed)

## 2012-10-09 ENCOUNTER — Encounter: Payer: Self-pay | Admitting: Obstetrics and Gynecology

## 2012-10-09 ENCOUNTER — Ambulatory Visit (INDEPENDENT_AMBULATORY_CARE_PROVIDER_SITE_OTHER): Payer: Medicare HMO | Admitting: Obstetrics and Gynecology

## 2012-10-09 VITALS — BP 113/70 | Wt 182.8 lb

## 2012-10-09 DIAGNOSIS — Z9884 Bariatric surgery status: Secondary | ICD-10-CM

## 2012-10-09 DIAGNOSIS — Z98891 History of uterine scar from previous surgery: Secondary | ICD-10-CM

## 2012-10-09 DIAGNOSIS — O0933 Supervision of pregnancy with insufficient antenatal care, third trimester: Secondary | ICD-10-CM

## 2012-10-09 DIAGNOSIS — O09523 Supervision of elderly multigravida, third trimester: Secondary | ICD-10-CM

## 2012-10-09 DIAGNOSIS — O09529 Supervision of elderly multigravida, unspecified trimester: Secondary | ICD-10-CM

## 2012-10-09 DIAGNOSIS — Z9889 Other specified postprocedural states: Secondary | ICD-10-CM

## 2012-10-09 DIAGNOSIS — O0930 Supervision of pregnancy with insufficient antenatal care, unspecified trimester: Secondary | ICD-10-CM

## 2012-10-09 LAB — POCT URINALYSIS DIP (DEVICE)
Bilirubin Urine: NEGATIVE
Glucose, UA: NEGATIVE mg/dL
Hgb urine dipstick: NEGATIVE
Ketones, ur: NEGATIVE mg/dL
Leukocytes, UA: NEGATIVE
Nitrite: NEGATIVE
Protein, ur: NEGATIVE mg/dL
Specific Gravity, Urine: 1.025 (ref 1.005–1.030)
Urobilinogen, UA: 1 mg/dL (ref 0.0–1.0)
pH: 6.5 (ref 5.0–8.0)

## 2012-10-09 NOTE — Progress Notes (Signed)
P = 98    US done @ [facility] on 6/10

## 2012-10-09 NOTE — Progress Notes (Signed)
Patient doing well without complaints. FM/labor precautions reviewed. Patient scheduled for c-section on 7/5. 6/10 EFW 79%tile. NST reviewed and reactive.

## 2012-10-12 ENCOUNTER — Ambulatory Visit (INDEPENDENT_AMBULATORY_CARE_PROVIDER_SITE_OTHER): Payer: Medicare HMO | Admitting: *Deleted

## 2012-10-12 VITALS — BP 119/66

## 2012-10-12 DIAGNOSIS — O09529 Supervision of elderly multigravida, unspecified trimester: Secondary | ICD-10-CM

## 2012-10-12 LAB — CULTURE, BETA STREP (GROUP B ONLY)

## 2012-10-12 NOTE — Progress Notes (Signed)
P = 96      Pt reports decr. FM x2 days- felt some FM during NST. BID kick count instructions given.

## 2012-10-16 ENCOUNTER — Other Ambulatory Visit: Payer: Medicare HMO

## 2012-10-16 NOTE — Progress Notes (Signed)
6/19 NST reviewed and reactive 

## 2012-10-20 ENCOUNTER — Other Ambulatory Visit: Payer: Self-pay | Admitting: Obstetrics and Gynecology

## 2012-10-20 ENCOUNTER — Encounter (HOSPITAL_COMMUNITY): Payer: Self-pay | Admitting: Pharmacist

## 2012-10-20 ENCOUNTER — Ambulatory Visit (HOSPITAL_COMMUNITY): Admission: RE | Admit: 2012-10-20 | Payer: Medicare HMO | Source: Ambulatory Visit

## 2012-10-20 ENCOUNTER — Ambulatory Visit (HOSPITAL_COMMUNITY)
Admission: RE | Admit: 2012-10-20 | Discharge: 2012-10-20 | Disposition: A | Discharge status: Home / Self Care | Payer: Medicare HMO | Source: Ambulatory Visit | Attending: Obstetrics and Gynecology | Admitting: Obstetrics and Gynecology

## 2012-10-20 DIAGNOSIS — O34219 Maternal care for unspecified type scar from previous cesarean delivery: Secondary | ICD-10-CM | POA: Insufficient documentation

## 2012-10-20 DIAGNOSIS — O09529 Supervision of elderly multigravida, unspecified trimester: Secondary | ICD-10-CM | POA: Insufficient documentation

## 2012-10-20 DIAGNOSIS — O3660X0 Maternal care for excessive fetal growth, unspecified trimester, not applicable or unspecified: Secondary | ICD-10-CM | POA: Insufficient documentation

## 2012-10-20 DIAGNOSIS — O0930 Supervision of pregnancy with insufficient antenatal care, unspecified trimester: Secondary | ICD-10-CM | POA: Insufficient documentation

## 2012-10-20 NOTE — Addendum Note (Signed)
Encounter addended by: Ty Hilts, RN on: 10/20/2012  3:49 PM<BR>     Documentation filed: Charges VN, OB Prenatal Vitals, Episodes

## 2012-10-20 NOTE — Progress Notes (Signed)
Angie [name]  was seen today for an ultrasound appointment.  See full report in [facility].  Impression: Single IUP at 38 0/7 weeks Active fetus with BPP of 8/8 Normal amniotic fluid volume (AFI 8.8 cm)  Recommendations: Continue 2x weekly NSTs with weekly AFIs. Patient is tentatively schedule for repeat C-section on 5 July.  Alpha Gula, MD

## 2012-10-26 ENCOUNTER — Other Ambulatory Visit (HOSPITAL_COMMUNITY): Payer: Medicare HMO

## 2012-10-26 ENCOUNTER — Other Ambulatory Visit: Payer: Self-pay | Admitting: Obstetrics & Gynecology

## 2012-10-26 ENCOUNTER — Encounter (HOSPITAL_COMMUNITY): Payer: Self-pay

## 2012-10-26 ENCOUNTER — Ambulatory Visit (INDEPENDENT_AMBULATORY_CARE_PROVIDER_SITE_OTHER): Payer: Medicare HMO | Admitting: Obstetrics & Gynecology

## 2012-10-26 ENCOUNTER — Encounter (HOSPITAL_COMMUNITY)
Admission: RE | Admit: 2012-10-26 | Discharge: 2012-10-26 | Disposition: A | Discharge status: Home / Self Care | Payer: Medicare HMO | Source: Ambulatory Visit | Attending: Obstetrics & Gynecology | Admitting: Obstetrics & Gynecology

## 2012-10-26 VITALS — BP 118/80 | Ht 60.0 in | Wt 187.0 lb

## 2012-10-26 DIAGNOSIS — O09529 Supervision of elderly multigravida, unspecified trimester: Secondary | ICD-10-CM

## 2012-10-26 DIAGNOSIS — O99019 Anemia complicating pregnancy, unspecified trimester: Secondary | ICD-10-CM

## 2012-10-26 DIAGNOSIS — Z01812 Encounter for preprocedural laboratory examination: Secondary | ICD-10-CM | POA: Insufficient documentation

## 2012-10-26 LAB — URINALYSIS, ROUTINE W REFLEX MICROSCOPIC
Bilirubin Urine: NEGATIVE
Glucose, UA: NEGATIVE mg/dL
Hgb urine dipstick: NEGATIVE
Ketones, ur: NEGATIVE mg/dL
Leukocytes, UA: NEGATIVE
Nitrite: NEGATIVE
Protein, ur: NEGATIVE mg/dL
Specific Gravity, Urine: 1.02 (ref 1.005–1.030)
Urobilinogen, UA: 0.2 mg/dL (ref 0.0–1.0)
pH: 6 (ref 5.0–8.0)

## 2012-10-26 LAB — CBC
HCT: 28.7 % — ABNORMAL LOW (ref 36.0–46.0)
Hemoglobin: 9.7 g/dL — ABNORMAL LOW (ref 12.0–15.0)
MCH: 28.9 pg (ref 26.0–34.0)
MCHC: 33.8 g/dL (ref 30.0–36.0)
MCV: 85.4 fL (ref 78.0–100.0)
Platelets: 228 10*3/uL (ref 150–400)
RBC: 3.36 MIL/uL — ABNORMAL LOW (ref 3.87–5.11)
RDW: 15.1 % (ref 11.5–15.5)
WBC: 5.9 10*3/uL (ref 4.0–10.5)

## 2012-10-26 LAB — RPR: RPR Ser Ql: NONREACTIVE

## 2012-10-26 LAB — ABO/RH: ABO/RH(D): B POS

## 2012-10-26 NOTE — Patient Instructions (Addendum)
Your procedure is scheduled on:10/28/12  Enter through the Main Entrance at :0930 Pick up desk phone and dial [phone] and inform us of your arrival.  Please call [phone] if you have any problems the morning of surgery.  Remember: Do not eat food after midnight:FRIDAY Clear liquids are ok until:7am SAT.   You may brush your teeth the morning of surgery.   DO NOT wear jewelry, eye make-up, lipstick,body lotion, or dark fingernail polish.  (Polished toes are ok) You may wear deodorant.  If you are to be admitted after surgery, leave suitcase in car until your room has been assigned. Patients discharged on the day of surgery will not be allowed to drive home. Wear loose fitting, comfortable clothes for your ride home.

## 2012-10-26 NOTE — Progress Notes (Signed)
NST performed today was reviewed and was found to be reactive.  AFI normal at 12.7 cm.  Continue recommended antenatal testing and prenatal care. 

## 2012-10-28 ENCOUNTER — Encounter (HOSPITAL_COMMUNITY): Payer: Self-pay | Admitting: *Deleted

## 2012-10-28 ENCOUNTER — Encounter (HOSPITAL_COMMUNITY): Payer: Self-pay | Admitting: Anesthesiology

## 2012-10-28 ENCOUNTER — Inpatient Hospital Stay (HOSPITAL_COMMUNITY): Payer: Medicare HMO | Admitting: Anesthesiology

## 2012-10-28 ENCOUNTER — Encounter (HOSPITAL_COMMUNITY)
Admission: RE | Disposition: A | Discharge status: Home / Self Care | Payer: Self-pay | Source: Ambulatory Visit | Attending: Obstetrics & Gynecology

## 2012-10-28 ENCOUNTER — Inpatient Hospital Stay (HOSPITAL_COMMUNITY)
Admission: RE | Admit: 2012-10-28 | Discharge: 2012-10-31 | DRG: 766 | Disposition: A | Discharge status: Home / Self Care | Payer: Medicare HMO | Source: Ambulatory Visit | Attending: Obstetrics & Gynecology | Admitting: Obstetrics & Gynecology

## 2012-10-28 DIAGNOSIS — M129 Arthropathy, unspecified: Secondary | ICD-10-CM | POA: Diagnosis present

## 2012-10-28 DIAGNOSIS — R0609 Other forms of dyspnea: Secondary | ICD-10-CM | POA: Diagnosis present

## 2012-10-28 DIAGNOSIS — O2432 Unspecified pre-existing diabetes mellitus in childbirth: Secondary | ICD-10-CM

## 2012-10-28 DIAGNOSIS — Z302 Encounter for sterilization: Secondary | ICD-10-CM

## 2012-10-28 DIAGNOSIS — D4959 Neoplasm of unspecified behavior of other genitourinary organ: Secondary | ICD-10-CM | POA: Diagnosis present

## 2012-10-28 DIAGNOSIS — O99844 Bariatric surgery status complicating childbirth: Secondary | ICD-10-CM | POA: Diagnosis present

## 2012-10-28 DIAGNOSIS — O9902 Anemia complicating childbirth: Secondary | ICD-10-CM | POA: Diagnosis present

## 2012-10-28 DIAGNOSIS — O34219 Maternal care for unspecified type scar from previous cesarean delivery: Principal | ICD-10-CM | POA: Diagnosis present

## 2012-10-28 DIAGNOSIS — F411 Generalized anxiety disorder: Secondary | ICD-10-CM | POA: Diagnosis present

## 2012-10-28 DIAGNOSIS — O99344 Other mental disorders complicating childbirth: Secondary | ICD-10-CM | POA: Diagnosis present

## 2012-10-28 DIAGNOSIS — D259 Leiomyoma of uterus, unspecified: Secondary | ICD-10-CM | POA: Diagnosis present

## 2012-10-28 DIAGNOSIS — R0989 Other specified symptoms and signs involving the circulatory and respiratory systems: Secondary | ICD-10-CM | POA: Diagnosis present

## 2012-10-28 DIAGNOSIS — O09529 Supervision of elderly multigravida, unspecified trimester: Secondary | ICD-10-CM | POA: Diagnosis present

## 2012-10-28 DIAGNOSIS — O34599 Maternal care for other abnormalities of gravid uterus, unspecified trimester: Secondary | ICD-10-CM | POA: Diagnosis present

## 2012-10-28 LAB — HEMOGLOBIN AND HEMATOCRIT, BLOOD
HCT: 20 % — ABNORMAL LOW (ref 36.0–46.0)
Hemoglobin: 6.8 g/dL — CL (ref 12.0–15.0)

## 2012-10-28 SURGERY — Surgical Case
Anesthesia: Spinal | Site: Abdomen | Laterality: Bilateral | Wound class: Clean Contaminated

## 2012-10-28 MED ORDER — ZOLPIDEM TARTRATE 5 MG PO TABS: 5.0000 mg | ORAL_TABLET | Freq: Every evening | ORAL | Status: DC | PRN

## 2012-10-28 MED ORDER — SCOPOLAMINE 1 MG/3DAYS TD PT72
MEDICATED_PATCH | TRANSDERMAL | Status: AC
  Administered 2012-10-28: 1.5 mg via TRANSDERMAL
  Filled 2012-10-28: qty 1

## 2012-10-28 MED ORDER — OXYTOCIN 10 UNIT/ML IJ SOLN
INTRAMUSCULAR | Status: AC
  Filled 2012-10-28: qty 4

## 2012-10-28 MED ORDER — SENNOSIDES-DOCUSATE SODIUM 8.6-50 MG PO TABS
2.0000 | ORAL_TABLET | Freq: Every day | ORAL | Status: DC
  Administered 2012-10-28 – 2012-10-30 (×3): 2 via ORAL

## 2012-10-28 MED ORDER — DIPHENHYDRAMINE HCL 25 MG PO CAPS
25.0000 mg | ORAL_CAPSULE | Freq: Four times a day (QID) | ORAL | Status: DC | PRN
  Administered 2012-10-29: 25 mg via ORAL
  Filled 2012-10-28 (×2): qty 1

## 2012-10-28 MED ORDER — MENTHOL 3 MG MT LOZG: 1.0000 | LOZENGE | OROMUCOSAL | Status: DC | PRN

## 2012-10-28 MED ORDER — SODIUM CHLORIDE 0.9 % IR SOLN
Status: DC | PRN
  Administered 2012-10-28: 1000 mL

## 2012-10-28 MED ORDER — DIPHENHYDRAMINE HCL 50 MG/ML IJ SOLN: 12.5000 mg | INTRAMUSCULAR | Status: DC | PRN

## 2012-10-28 MED ORDER — SCOPOLAMINE 1 MG/3DAYS TD PT72: 1.0000 | MEDICATED_PATCH | Freq: Once | TRANSDERMAL | Status: DC

## 2012-10-28 MED ORDER — OXYTOCIN 10 UNIT/ML IJ SOLN
40.0000 [IU] | INTRAVENOUS | Status: DC | PRN
  Administered 2012-10-28: 40 [IU] via INTRAVENOUS

## 2012-10-28 MED ORDER — MEPERIDINE HCL 25 MG/ML IJ SOLN: 6.2500 mg | INTRAMUSCULAR | Status: DC | PRN

## 2012-10-28 MED ORDER — KETOROLAC TROMETHAMINE 60 MG/2ML IM SOLN: 60.0000 mg | Freq: Once | INTRAMUSCULAR | Status: AC | PRN

## 2012-10-28 MED ORDER — SODIUM CHLORIDE 0.9 % IJ SOLN
INTRAMUSCULAR | Status: DC | PRN
  Administered 2012-10-28: 40 mL via INTRAVENOUS

## 2012-10-28 MED ORDER — WITCH HAZEL-GLYCERIN EX PADS: 1.0000 "application " | MEDICATED_PAD | CUTANEOUS | Status: DC | PRN

## 2012-10-28 MED ORDER — NALBUPHINE HCL 10 MG/ML IJ SOLN
5.0000 mg | INTRAMUSCULAR | Status: DC | PRN
  Filled 2012-10-28: qty 1

## 2012-10-28 MED ORDER — FENTANYL CITRATE 0.05 MG/ML IJ SOLN
INTRAMUSCULAR | Status: DC | PRN
  Administered 2012-10-28: 12.5 ug via INTRATHECAL

## 2012-10-28 MED ORDER — KETOROLAC TROMETHAMINE 30 MG/ML IJ SOLN
30.0000 mg | Freq: Four times a day (QID) | INTRAMUSCULAR | Status: AC | PRN
  Administered 2012-10-28: 30 mg via INTRAVENOUS
  Filled 2012-10-28: qty 1

## 2012-10-28 MED ORDER — NALOXONE HCL 1 MG/ML IJ SOLN
1.0000 ug/kg/h | INTRAMUSCULAR | Status: DC | PRN
  Filled 2012-10-28: qty 2

## 2012-10-28 MED ORDER — NALBUPHINE HCL 10 MG/ML IJ SOLN: 5.0000 mg | INTRAMUSCULAR | Status: DC | PRN

## 2012-10-28 MED ORDER — PROMETHAZINE HCL 25 MG/ML IJ SOLN: 6.2500 mg | INTRAMUSCULAR | Status: DC | PRN

## 2012-10-28 MED ORDER — DIPHENHYDRAMINE HCL 50 MG/ML IJ SOLN: 25.0000 mg | INTRAMUSCULAR | Status: DC | PRN

## 2012-10-28 MED ORDER — LANOLIN HYDROUS EX OINT: 1.0000 "application " | TOPICAL_OINTMENT | CUTANEOUS | Status: DC | PRN

## 2012-10-28 MED ORDER — MORPHINE SULFATE (PF) 0.5 MG/ML IJ SOLN
INTRAMUSCULAR | Status: DC | PRN
  Administered 2012-10-28: .1 mg via INTRATHECAL

## 2012-10-28 MED ORDER — KETOROLAC TROMETHAMINE 30 MG/ML IJ SOLN: 30.0000 mg | Freq: Four times a day (QID) | INTRAMUSCULAR | Status: DC | PRN

## 2012-10-28 MED ORDER — DIPHENHYDRAMINE HCL 50 MG/ML IJ SOLN
25.0000 mg | INTRAMUSCULAR | Status: DC | PRN
  Filled 2012-10-28: qty 1

## 2012-10-28 MED ORDER — NALOXONE HCL 0.4 MG/ML IJ SOLN: 0.4000 mg | INTRAMUSCULAR | Status: DC | PRN

## 2012-10-28 MED ORDER — ONDANSETRON HCL 4 MG PO TABS: 4.0000 mg | ORAL_TABLET | ORAL | Status: DC | PRN

## 2012-10-28 MED ORDER — METOCLOPRAMIDE HCL 5 MG/ML IJ SOLN: 10.0000 mg | Freq: Three times a day (TID) | INTRAMUSCULAR | Status: DC | PRN

## 2012-10-28 MED ORDER — BUTALBITAL-APAP-CAFFEINE 50-325-40 MG PO TABS
1.0000 | ORAL_TABLET | Freq: Four times a day (QID) | ORAL | Status: DC | PRN
  Administered 2012-10-29: 1 via ORAL
  Filled 2012-10-28: qty 1

## 2012-10-28 MED ORDER — MORPHINE SULFATE 0.5 MG/ML IJ SOLN
INTRAMUSCULAR | Status: AC
  Filled 2012-10-28: qty 10

## 2012-10-28 MED ORDER — DIPHENHYDRAMINE HCL 25 MG PO CAPS: 25.0000 mg | ORAL_CAPSULE | ORAL | Status: DC | PRN

## 2012-10-28 MED ORDER — ONDANSETRON HCL 4 MG/2ML IJ SOLN: 4.0000 mg | INTRAMUSCULAR | Status: DC | PRN

## 2012-10-28 MED ORDER — OXYTOCIN 40 UNITS IN LACTATED RINGERS INFUSION - SIMPLE MED: 62.5000 mL/h | INTRAVENOUS | Status: AC

## 2012-10-28 MED ORDER — LACTATED RINGERS IV SOLN
INTRAVENOUS | Status: DC
  Administered 2012-10-29: 01:00:00 via INTRAVENOUS

## 2012-10-28 MED ORDER — DIPHENHYDRAMINE HCL 50 MG/ML IJ SOLN
12.5000 mg | INTRAMUSCULAR | Status: DC | PRN
  Administered 2012-10-29: 12.5 mg via INTRAVENOUS

## 2012-10-28 MED ORDER — BUPIVACAINE LIPOSOME 1.3 % IJ SUSP
20.0000 mL | Freq: Once | INTRAMUSCULAR | Status: DC
  Filled 2012-10-28: qty 20

## 2012-10-28 MED ORDER — KETOROLAC TROMETHAMINE 60 MG/2ML IM SOLN: 60.0000 mg | Freq: Once | INTRAMUSCULAR | Status: DC | PRN

## 2012-10-28 MED ORDER — ONDANSETRON HCL 4 MG/2ML IJ SOLN
INTRAMUSCULAR | Status: AC
  Filled 2012-10-28: qty 2

## 2012-10-28 MED ORDER — NALOXONE HCL 1 MG/ML IJ SOLN: 1.0000 ug/kg/h | INTRAVENOUS | Status: DC | PRN

## 2012-10-28 MED ORDER — CEFAZOLIN SODIUM-DEXTROSE 2-3 GM-% IV SOLR
INTRAVENOUS | Status: AC
  Filled 2012-10-28: qty 50

## 2012-10-28 MED ORDER — SODIUM CHLORIDE 0.9 % IJ SOLN: 3.0000 mL | INTRAMUSCULAR | Status: DC | PRN

## 2012-10-28 MED ORDER — KETOROLAC TROMETHAMINE 60 MG/2ML IM SOLN
INTRAMUSCULAR | Status: AC
  Administered 2012-10-28: 60 mg via INTRAMUSCULAR
  Filled 2012-10-28: qty 2

## 2012-10-28 MED ORDER — LACTATED RINGERS IV SOLN: INTRAVENOUS | Status: DC

## 2012-10-28 MED ORDER — FENTANYL CITRATE 0.05 MG/ML IJ SOLN
INTRAMUSCULAR | Status: AC
  Filled 2012-10-28: qty 2

## 2012-10-28 MED ORDER — DIPHENHYDRAMINE HCL 25 MG PO CAPS
25.0000 mg | ORAL_CAPSULE | ORAL | Status: DC | PRN
  Administered 2012-10-30: 25 mg via ORAL
  Filled 2012-10-28: qty 1

## 2012-10-28 MED ORDER — ONDANSETRON HCL 4 MG/2ML IJ SOLN: 4.0000 mg | Freq: Three times a day (TID) | INTRAMUSCULAR | Status: DC | PRN

## 2012-10-28 MED ORDER — ONDANSETRON HCL 4 MG/2ML IJ SOLN
INTRAMUSCULAR | Status: DC | PRN
  Administered 2012-10-28: 4 mg via INTRAVENOUS

## 2012-10-28 MED ORDER — TETANUS-DIPHTH-ACELL PERTUSSIS 5-2.5-18.5 LF-MCG/0.5 IM SUSP
0.5000 mL | Freq: Once | INTRAMUSCULAR | Status: AC
  Administered 2012-10-31: 0.5 mL via INTRAMUSCULAR

## 2012-10-28 MED ORDER — FERROUS SULFATE 325 (65 FE) MG PO TABS
325.0000 mg | ORAL_TABLET | ORAL | Status: DC
  Administered 2012-10-30: 325 mg via ORAL
  Filled 2012-10-28 (×3): qty 1

## 2012-10-28 MED ORDER — OXYCODONE-ACETAMINOPHEN 5-325 MG PO TABS
1.0000 | ORAL_TABLET | ORAL | Status: DC | PRN
  Administered 2012-10-29: 2 via ORAL
  Administered 2012-10-29: 1 via ORAL
  Administered 2012-10-29: 2 via ORAL
  Administered 2012-10-29: 1 via ORAL
  Administered 2012-10-29 – 2012-10-30 (×4): 2 via ORAL
  Administered 2012-10-30: 1 via ORAL
  Filled 2012-10-28 (×3): qty 2
  Filled 2012-10-28: qty 1
  Filled 2012-10-28 (×2): qty 2
  Filled 2012-10-28: qty 1
  Filled 2012-10-28 (×2): qty 2
  Filled 2012-10-28: qty 1
  Filled 2012-10-28: qty 2

## 2012-10-28 MED ORDER — FLUOXETINE HCL 20 MG PO CAPS
20.0000 mg | ORAL_CAPSULE | Freq: Every day | ORAL | Status: DC
  Administered 2012-10-28 – 2012-10-31 (×4): 20 mg via ORAL
  Filled 2012-10-28 (×5): qty 1

## 2012-10-28 MED ORDER — EPHEDRINE SULFATE 50 MG/ML IJ SOLN
INTRAMUSCULAR | Status: DC | PRN
  Administered 2012-10-28: 10 mg via INTRAVENOUS
  Administered 2012-10-28: 5 mg via INTRAVENOUS
  Administered 2012-10-28: 10 mg via INTRAVENOUS
  Administered 2012-10-28: 5 mg via INTRAVENOUS
  Administered 2012-10-28: 10 mg via INTRAVENOUS

## 2012-10-28 MED ORDER — FENTANYL CITRATE 0.05 MG/ML IJ SOLN
INTRAMUSCULAR | Status: AC
  Administered 2012-10-28: 50 ug via INTRAVENOUS
  Filled 2012-10-28: qty 2

## 2012-10-28 MED ORDER — SIMETHICONE 80 MG PO CHEW
80.0000 mg | CHEWABLE_TABLET | Freq: Three times a day (TID) | ORAL | Status: DC
  Administered 2012-10-28 – 2012-10-31 (×9): 80 mg via ORAL

## 2012-10-28 MED ORDER — BUPIVACAINE LIPOSOME 1.3 % IJ SUSP
INTRAMUSCULAR | Status: DC | PRN
  Administered 2012-10-28: 55 mL

## 2012-10-28 MED ORDER — PRENATAL MULTIVITAMIN CH
1.0000 | ORAL_TABLET | Freq: Every day | ORAL | Status: DC
  Administered 2012-10-29 – 2012-10-30 (×2): 1 via ORAL
  Filled 2012-10-28 (×3): qty 1

## 2012-10-28 MED ORDER — BUPIVACAINE IN DEXTROSE 0.75-8.25 % IT SOLN
INTRATHECAL | Status: DC | PRN
  Administered 2012-10-28: 1.5 mL via INTRATHECAL

## 2012-10-28 MED ORDER — NALBUPHINE HCL 10 MG/ML IJ SOLN
5.0000 mg | INTRAMUSCULAR | Status: DC | PRN
Start: 2012-10-28 — End: 2012-11-01
  Filled 2012-10-28: qty 1

## 2012-10-28 MED ORDER — ACETAMINOPHEN 10 MG/ML IV SOLN
1000.0000 mg | Freq: Once | INTRAVENOUS | Status: AC
  Administered 2012-10-28: 1000 mg via INTRAVENOUS
  Filled 2012-10-28: qty 100

## 2012-10-28 MED ORDER — DIBUCAINE 1 % RE OINT: 1.0000 "application " | TOPICAL_OINTMENT | RECTAL | Status: DC | PRN

## 2012-10-28 MED ORDER — IBUPROFEN 600 MG PO TABS
600.0000 mg | ORAL_TABLET | Freq: Four times a day (QID) | ORAL | Status: DC
  Administered 2012-10-29 – 2012-10-31 (×10): 600 mg via ORAL
  Filled 2012-10-28 (×11): qty 1

## 2012-10-28 MED ORDER — PRENATAL MULTIVITAMIN CH: 1.0000 | ORAL_TABLET | Freq: Every day | ORAL | Status: DC

## 2012-10-28 MED ORDER — FENTANYL CITRATE 0.05 MG/ML IJ SOLN
25.0000 ug | INTRAMUSCULAR | Status: DC | PRN
  Administered 2012-10-28: 50 ug via INTRAVENOUS

## 2012-10-28 MED ORDER — LACTATED RINGERS IV SOLN
INTRAVENOUS | Status: DC
  Administered 2012-10-28 (×3): via INTRAVENOUS

## 2012-10-28 MED ORDER — FENTANYL CITRATE 0.05 MG/ML IJ SOLN: 25.0000 ug | INTRAMUSCULAR | Status: DC | PRN

## 2012-10-28 MED ORDER — CEFAZOLIN SODIUM-DEXTROSE 2-3 GM-% IV SOLR
2.0000 g | INTRAVENOUS | Status: AC
  Administered 2012-10-28: 2 g via INTRAVENOUS

## 2012-10-28 MED ORDER — KETOROLAC TROMETHAMINE 30 MG/ML IJ SOLN: 30.0000 mg | Freq: Four times a day (QID) | INTRAMUSCULAR | Status: AC | PRN

## 2012-10-28 MED ORDER — SIMETHICONE 80 MG PO CHEW
80.0000 mg | CHEWABLE_TABLET | ORAL | Status: DC | PRN
  Administered 2012-10-28: 80 mg via ORAL

## 2012-10-28 SURGICAL SUPPLY — 45 items
ADH SKN CLS APL DERMABOND .7 (GAUZE/BANDAGES/DRESSINGS) ×2
ADH SKN CLS LQ APL DERMABOND (GAUZE/BANDAGES/DRESSINGS) ×1
CLAMP CORD UMBIL (MISCELLANEOUS) IMPLANT
CLOTH BEACON ORANGE TIMEOUT ST (SAFETY) ×2 IMPLANT
DERMABOND ADHESIVE PROPEN (GAUZE/BANDAGES/DRESSINGS) ×1
DERMABOND ADVANCED (GAUZE/BANDAGES/DRESSINGS) ×2
DERMABOND ADVANCED .7 DNX12 (GAUZE/BANDAGES/DRESSINGS) ×2 IMPLANT
DERMABOND ADVANCED .7 DNX6 (GAUZE/BANDAGES/DRESSINGS) IMPLANT
DRAPE LG THREE QUARTER DISP (DRAPES) ×2 IMPLANT
DRSG OPSITE POSTOP 4X10 (GAUZE/BANDAGES/DRESSINGS) ×2 IMPLANT
DURAPREP 26ML APPLICATOR (WOUND CARE) ×4 IMPLANT
ELECT REM PT RETURN 9FT ADLT (ELECTROSURGICAL) ×2 IMPLANT
ELECTRODE REM PT RTRN 9FT ADLT (ELECTROSURGICAL) ×1 IMPLANT
EXTRACTOR VACUUM BELL STYLE (SUCTIONS) IMPLANT
GAUZE SPONGE 4X4 12PLY STRL LF (GAUZE/BANDAGES/DRESSINGS) ×1 IMPLANT
GLOVE BIOGEL PI IND STRL 8 (GLOVE) ×1 IMPLANT
GLOVE BIOGEL PI INDICATOR 8 (GLOVE) ×1
GLOVE ECLIPSE 8.0 STRL XLNG CF (GLOVE) ×2 IMPLANT
GOWN STRL REIN XL XLG (GOWN DISPOSABLE) ×4 IMPLANT
KIT ABG SYR 3ML LUER SLIP (SYRINGE) ×1 IMPLANT
NDL HYPO 18GX1.5 BLUNT FILL (NEEDLE) ×1 IMPLANT
NDL HYPO 25X5/8 SAFETYGLIDE (NEEDLE) ×1 IMPLANT
NEEDLE HYPO 18GX1.5 BLUNT FILL (NEEDLE) ×2 IMPLANT
NEEDLE HYPO 22GX1.5 SAFETY (NEEDLE) ×2 IMPLANT
NEEDLE HYPO 25X5/8 SAFETYGLIDE (NEEDLE) IMPLANT
NS IRRIG 1000ML POUR BTL (IV SOLUTION) ×2 IMPLANT
PACK C SECTION WH (CUSTOM PROCEDURE TRAY) ×2 IMPLANT
PAD ABD 7.5X8 STRL (GAUZE/BANDAGES/DRESSINGS) ×1 IMPLANT
PAD OB MATERNITY 4.3X12.25 (PERSONAL CARE ITEMS) ×2 IMPLANT
RTRCTR C-SECT PINK 25CM LRG (MISCELLANEOUS) IMPLANT
STAPLER VISISTAT 35W (STAPLE) IMPLANT
SUT CHROMIC 0 CT 1 (SUTURE) ×2 IMPLANT
SUT MNCRL 0 VIOLET CTX 36 (SUTURE) ×2 IMPLANT
SUT MONOCRYL 0 CTX 36 (SUTURE) ×9
SUT PLAIN 2 0 (SUTURE) ×6
SUT PLAIN 2 0 XLH (SUTURE) IMPLANT
SUT PLAIN ABS 2-0 CT1 27XMFL (SUTURE) ×2 IMPLANT
SUT VIC AB 0 CTX 36 (SUTURE) ×2
SUT VIC AB 0 CTX36XBRD ANBCTRL (SUTURE) ×1 IMPLANT
SUT VIC AB 4-0 KS 27 (SUTURE) ×1 IMPLANT
SYR 20CC LL (SYRINGE) ×4 IMPLANT
TAPE CLOTH SURG 4X10 WHT LF (GAUZE/BANDAGES/DRESSINGS) ×1 IMPLANT
TOWEL OR 17X24 6PK STRL BLUE (TOWEL DISPOSABLE) ×6 IMPLANT
TRAY FOLEY CATH 14FR (SET/KITS/TRAYS/PACK) IMPLANT
WATER STERILE IRR 1000ML POUR (IV SOLUTION) ×2 IMPLANT

## 2012-10-28 NOTE — Op Note (Signed)
Preoperative diagnosis:  1.  Intrauterine pregnancy at [redacted]w[redacted]d weeks gestation                                         2.  previous C-section times 3                                         3.  desires sterilization                                         4.  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  There was absolutely no opportunity to dissect the low uterine segment safely without substantial risk to the bladder.  Really the uterus up where it starts to widen out was adherent to that point.  I had no option but to perform a classical cesarean section.  Her was an anterior placenta in that had to be delivered the head of the baby.  It dissected easily off the uterus.  Unfortunately there was also a myoma just to the left lateral of midline and this had to be involved with the closure.    Over a classical cesarean section incision was delivered a viable female with Apgars of 9 and 9 weighing  lbs.  oz. Uterus, tubes and ovaries were all normal.  The upper uterus was also adherent on the right but this was taken down easily with a crossclamping with a Kelly and suturing both ends.  The tubal ligation was not difficult there were no adhesions in the adnexa.  There were no other significant findings  Description of operation:  Patient was taken to the operating room and placed in the sitting position where she underwent a spinal anesthetic. She was then placed in the supine position with tilt to the left side. When adequate anesthetic level was obtained she was prepped and draped in usual sterile fashion and a Foley catheter was placed. A Pfannenstiel skin incision was  made and carried down sharply to the rectus fascia which was scored in the midline extended laterally. The fascia was indistinguishable from the muscles which was indistinguishable from the lower uterine segment.  No plane could be dissected and I resorted to a classical cesarean section to avoid injury to the bladder. I made a vertical classical cesarean section incision and delivered a viable female  infant at  with Apgars of 9 and 9 weighing lbs  oz.  The uterus was exteriorized. It was closed in multiple layers, quite honestly I lost track, probably 5 layers. A modified Pomeroy bilateral tubal ligation was performed bilaterally without difficulty.  I held pressure and observed the uterus for approximately 5 minutes before arriving at the conclusion that it was hemostatic. Peritoneal cavity was irrigated vigorously.  To the extent that I could The muscles and peritoneum were reapproximated loosely. The fascia was closed using 0  Vicryl in running fashion. Subcutaneous tissue was made hemostatic and irrigated. The skin was closed using skin staples    Blood loss for the procedure was 700 cc. The patient received 2 gram of Ancef prophylactically. The patient was taken to the recovery room in good stable condition with all counts being correct x3.  A pressure dressing was also placed  EBL 700 cc  Angie [name] H 10/28/2012 1:09 PM

## 2012-10-28 NOTE — Transfer of Care (Signed)
Immediate Anesthesia Transfer of Care Note  Patient: Angie [name]  Procedure(s) Performed: Procedure(s): Repeat cesarean section with delivery of baby boy at 1210. Apgars 8/9.  BILATERAL TUBAL LIGATION (Bilateral)  Patient Location: PACU  Anesthesia Type:Spinal  Level of Consciousness: awake, alert  and oriented  Airway & Oxygen Therapy: Patient Spontanous Breathing  Post-op Assessment: Report given to PACU RN and Post -op Vital signs reviewed and stable  Post vital signs: Reviewed and stable  Complications: No apparent anesthesia complications

## 2012-10-28 NOTE — Anesthesia Procedure Notes (Signed)
Spinal  Patient location during procedure: OR Staffing Anesthesiologist: Phillips Grout Performed by: anesthesiologist  Preanesthetic Checklist Completed: patient identified, site marked, surgical consent, pre-op evaluation, timeout performed, IV checked, risks and benefits discussed and monitors and equipment checked Spinal Block Patient position: sitting Prep: ChloraPrep Patient monitoring: heart rate, continuous pulse ox and blood pressure Approach: midline Location: L3-4 Injection technique: single-shot Needle Needle type: Pencan  Needle gauge: 24 G Needle length: 9 cm Additional Notes Expiration date of kit checked and confirmed. Patient tolerated procedure well, without complications.

## 2012-10-28 NOTE — Anesthesia Postprocedure Evaluation (Signed)
  Anesthesia Post-op Note  Patient: Angie [name]  Procedure(s) Performed: Procedure(s) (LRB): Repeat cesarean section with delivery of baby boy at 1210. Apgars 8/9.  BILATERAL TUBAL LIGATION (Bilateral)  Patient Location: PACU  Anesthesia Type: Spinal  Level of Consciousness: awake and alert   Airway and Oxygen Therapy: Patient Spontanous Breathing  Post-op Pain: mild  Post-op Assessment: Post-op Vital signs reviewed, Patient's Cardiovascular Status Stable, Respiratory Function Stable, Patent Airway and No signs of Nausea or vomiting  Last Vitals:  Filed Vitals:   10/28/12 1325  BP: 106/65  Pulse: 71  Temp: 36.3 C  Resp: 13    Post-op Vital Signs: stable   Complications: No apparent anesthesia complications

## 2012-10-28 NOTE — OR Nursing (Addendum)
Uterus massaged by S. Bria Portales RN.. 0cc of blood evacuated from uterus during uterine massage. Two tubes of cord blood sent to lab. 

## 2012-10-28 NOTE — H&P (Signed)
Preoperative History and Physical  Angie [name] is a 47 y.o. [phone] with Patient's last menstrual period was 01/28/2012. admitted for a repeat Caesarean section and bilateral tubal ligation.  She is s/p section x 3, not a candidate for vaginal trial.  Desires steril;ization  PMH:    Past Medical History  Diagnosis Date  . Chronic pain   . Bulging lumbar disc   . Abnormal Pap smear   . Anxiety     Takes xanax  . Arthritis     Has rx for percocet  . Migraines   . Anemia     PSH:     Past Surgical History  Procedure Laterality Date  . Cesarean section      X 3  . Cervical cone biopsy    . Gastric bypass  2007    POb/GynH:      OB History   Grav Para Term Preterm Abortions TAB SAB Ect Mult Living   6 3 3  0 2  2   3       SH:   History  Substance Use Topics  . Smoking status: Former Smoker -- 0.50 packs/day for 10 years    Types: Cigarettes    Quit date: 03/16/2012  . Smokeless tobacco: Never Used  . Alcohol Use: No     Comment: Had "issues" then socially, now none    FH:    Family History  Problem Relation Age of Onset  . Diabetes Father   . Depression Maternal Grandmother   . Heart disease Maternal Grandfather   . Depression Paternal Grandmother      Allergies: No Known Allergies  Medications:      Current facility-administered medications:bupivacaine liposome (EXPAREL) 1.3 % injection 266 mg, 20 mL, Infiltration, Once, Lazaro Arms, MD;  ceFAZolin (ANCEF) 2-3 GM-% IVPB SOLR, , , , ;  ceFAZolin (ANCEF) IVPB 2 g/50 mL premix, 2 g, Intravenous, On Call to OR, Lazaro Arms, MD;  lactated ringers infusion, , Intravenous, Continuous, Dana Allan, MD, Last Rate: 125 mL/hr at 10/28/12 1030 scopolamine (TRANSDERM-SCOP) 1.5 MG 1.5 mg, 1 patch, Transdermal, Once, Dana Allan, MD, 1.5 mg at 10/28/12 6578  Review of Systems:   Review of Systems  Constitutional: Negative for fever, chills, weight loss, malaise/fatigue and diaphoresis.  HENT: Negative for  hearing loss, ear pain, nosebleeds, congestion, sore throat, neck pain, tinnitus and ear discharge.   Eyes: Negative for blurred vision, double vision, photophobia, pain, discharge and redness.  Respiratory: Negative for cough, hemoptysis, sputum production, shortness of breath, wheezing and stridor.   Cardiovascular: Negative for chest pain, palpitations, orthopnea, claudication, leg swelling and PND.  Gastrointestinal: Positive for abdominal pain. Negative for heartburn, nausea, vomiting, diarrhea, constipation, blood in stool and melena.  Genitourinary: Negative for dysuria, urgency, frequency, hematuria and flank pain.  Musculoskeletal: Negative for myalgias, back pain, joint pain and falls.  Skin: Negative for itching and rash.  Neurological: Negative for dizziness, tingling, tremors, sensory change, speech change, focal weakness, seizures, loss of consciousness, weakness and headaches.  Endo/Heme/Allergies: Negative for environmental allergies and polydipsia. Does not bruise/bleed easily.  Psychiatric/Behavioral: Negative for depression, suicidal ideas, hallucinations, memory loss and substance abuse. The patient is not nervous/anxious and does not have insomnia.      PHYSICAL EXAM:  Blood pressure 114/87, pulse 80, temperature 98.4 F (36.9 C), temperature source Oral, resp. rate 18, last menstrual period 01/28/2012, SpO2 100.00%.    Vitals reviewed. Constitutional: She is oriented to person, place, and time. She appears  well-developed and well-nourished.  HENT:  Head: Normocephalic and atraumatic.  Right Ear: External ear normal.  Left Ear: External ear normal.  Nose: Nose normal.  Mouth/Throat: Oropharynx is clear and moist.  Eyes: Conjunctivae and EOM are normal. Pupils are equal, round, and reactive to light. Right eye exhibits no discharge. Left eye exhibits no discharge. No scleral icterus.  Neck: Normal range of motion. Neck supple. No tracheal deviation present. No  thyromegaly present.  Cardiovascular: Normal rate, regular rhythm, normal heart sounds and intact distal pulses.  Exam reveals no gallop and no friction rub.   No murmur heard. Respiratory: Effort normal and breath sounds normal. No respiratory distress. She has no wheezes. She has no rales. She exhibits no tenderness.  GI: Soft. Bowel sounds are normal. She exhibits no distension and no mass. There is tenderness. There is no rebound and no guarding.  Genitourinary:       Vulva is normal without lesions Vagina is pink moist without discharge Cervix normal in appearance and pap is normal Uterus is consistent with dates Adnexa is negative with normal sized ovaries by sonogram  Musculoskeletal: Normal range of motion. She exhibits no edema and no tenderness.  Neurological: She is alert and oriented to person, place, and time. She has normal reflexes. She displays normal reflexes. No cranial nerve deficit. She exhibits normal muscle tone. Coordination normal.  Skin: Skin is warm and dry. No rash noted. No erythema. No pallor.  Psychiatric: She has a normal mood and affect. Her behavior is normal. Judgment and thought content normal.    Labs: Results for orders placed during the hospital encounter of 10/26/12 (from the past 336 hour(s))  URINALYSIS, ROUTINE W REFLEX MICROSCOPIC   Collection Time    10/26/12 11:15 AM      Result Value Range   Color, Urine YELLOW  YELLOW   APPearance CLEAR  CLEAR   Specific Gravity, Urine 1.020  1.005 - 1.030   pH 6.0  5.0 - 8.0   Glucose, UA NEGATIVE  NEGATIVE mg/dL   Hgb urine dipstick NEGATIVE  NEGATIVE   Bilirubin Urine NEGATIVE  NEGATIVE   Ketones, ur NEGATIVE  NEGATIVE mg/dL   Protein, ur NEGATIVE  NEGATIVE mg/dL   Urobilinogen, UA 0.2  0.0 - 1.0 mg/dL   Nitrite NEGATIVE  NEGATIVE   Leukocytes, UA NEGATIVE  NEGATIVE  TYPE AND SCREEN   Collection Time    10/26/12 11:15 AM      Result Value Range   ABO/RH(D) B POS     Antibody Screen NEG      Sample Expiration 10/29/2012     Unit Number [id number]     Blood Component Type RED CELLS,LR     Unit division 00     Status of Unit ALLOCATED     Transfusion Status OK TO TRANSFUSE     Crossmatch Result Compatible     Unit Number [id number]     Blood Component Type RED CELLS,LR     Unit division 00     Status of Unit ALLOCATED     Transfusion Status OK TO TRANSFUSE     Crossmatch Result Compatible    ABO/RH   Collection Time    10/26/12 11:15 AM      Result Value Range   ABO/RH(D) B POS    CBC   Collection Time    10/26/12 11:30 AM      Result Value Range   WBC 5.9  4.0 - 10.5 K/uL   RBC  3.36 (*) 3.87 - 5.11 MIL/uL   Hemoglobin 9.7 (*) 12.0 - 15.0 g/dL   HCT 46.9 (*) 62.9 - 52.8 %   MCV 85.4  78.0 - 100.0 fL   MCH 28.9  26.0 - 34.0 pg   MCHC 33.8  30.0 - 36.0 g/dL   RDW 41.3  24.4 - 01.0 %   Platelets 228  150 - 400 K/uL  RPR   Collection Time    10/26/12 11:30 AM      Result Value Range   RPR NON REACTIVE  NON REACTIVE    EKG: No orders found for this or any previous visit.  Imaging Studies: US Ob Follow Up  10/03/2012   OBSTETRICAL ULTRASOUND: This exam was performed within a [facility]. The OB US report was generated in the AS system, and faxed to the ordering physician.   This report is also available in [employer] and in the [employer]. See AS Obstetric US report.  US Fetal Bpp W/o Non Stress  10/20/2012   OBSTETRICAL ULTRASOUND: This exam was performed within a [facility]. The OB US report was generated in the AS system, and faxed to the ordering physician.   This report is also available in [employer] and in the [employer]. See AS Obstetric US report.     Assessment: [redacted]w[redacted]d [id number] Previous Caesaerean x 3 Patient Active Problem List   Diagnosis Date Noted  . Anemia during pregnancy 07/24/2012  . Back pain complicating pregnancy 07/24/2012  . AMA  (advanced maternal age) multigravida 35+ 05/16/2012  . Insufficient prenatal care 05/16/2012  . Previous cesarean section 05/16/2012  . Arthritis 05/16/2012  . S/P gastric bypass 05/16/2012    Plan: Repeat section, BTL  Jp Eastham H 10/28/2012 10:53 AM

## 2012-10-28 NOTE — Consult Note (Signed)
Neonatology Note:   Attendance at C-section:    I was asked by Dr. Despina Hidden to attend this repeat C/S at term. The mother is a G6P3A2 B pos, GBS neg former smoker. ROM at delivery, fluid clear. Infant vigorous with good spontaneous cry and tone. Needed only bulb suctioning. Ap 8/9. Lungs clear to ausc in DR. To CN to care of Pediatrician.   Doretha Sou, MD

## 2012-10-28 NOTE — Anesthesia Preprocedure Evaluation (Addendum)
Anesthesia Evaluation  Patient identified by MRN, date of birth, ID band Patient awake    Reviewed: Allergy & Precautions, H&P , NPO status , Patient's Chart, lab work & pertinent test results  Airway Mallampati: II TM Distance: >3 FB Neck ROM: Full    Dental no notable dental hx. (+) Poor Dentition   Pulmonary neg pulmonary ROS,  breath sounds clear to auscultation  Pulmonary exam normal       Cardiovascular negative cardio ROS  Rhythm:Regular Rate:Normal     Neuro/Psych negative neurological ROS  negative psych ROS   GI/Hepatic negative GI ROS, Neg liver ROS,   Endo/Other  negative endocrine ROS  Renal/GU negative Renal ROS  negative genitourinary   Musculoskeletal negative musculoskeletal ROS (+)   Abdominal   Peds negative pediatric ROS (+)  Hematology negative hematology ROS (+) Blood dyscrasia, anemia ,   Anesthesia Other Findings   Reproductive/Obstetrics negative OB ROS (+) Pregnancy                          Anesthesia Physical Anesthesia Plan  ASA: II  Anesthesia Plan: Spinal   Post-op Pain Management:    Induction:   Airway Management Planned:   Additional Equipment:   Intra-op Plan:   Post-operative Plan:   Informed Consent: I have reviewed the patients History and Physical, chart, labs and discussed the procedure including the risks, benefits and alternatives for the proposed anesthesia with the patient or authorized representative who has indicated his/her understanding and acceptance.   Dental advisory given  Plan Discussed with: CRNA  Anesthesia Plan Comments:         Anesthesia Quick Evaluation

## 2012-10-28 NOTE — Progress Notes (Signed)
Subjective: Postpartum Day 0: Cesarean Delivery Patient reports incisional pain.  A little dizzy  Objective: Vital signs in last 24 hours: Temp:  [97.4 F (36.3 C)-98.5 F (36.9 C)] 97.9 F (36.6 C) (07/05 2019) Pulse Rate:  [69-110] 103 (07/05 2035) Resp:  [13-22] 20 (07/05 2019) BP: (85-114)/(57-87) 95/59 mmHg (07/05 2035) SpO2:  [94 %-100 %] 100 % (07/05 2019) Weight:  [187 lb (84.823 kg)] 187 lb (84.823 kg) (07/05 1500)  Physical Exam:     Recent Labs  10/26/12 1130 10/28/12 2007  HGB 9.7* 6.8*  HCT 28.7* 20.0*    Assessment/Plan: Status post Cesarean section.  As expected [facility] has dropped, will recheck in am , I told patient that it was likely she may need transfusion given the classical section and normal equilibration and drop, which occurs post op with that type of caesarean  Lanissa Cashen H 10/28/2012, 9:35 PM

## 2012-10-29 LAB — CBC
HCT: 15.5 % — ABNORMAL LOW (ref 36.0–46.0)
Hemoglobin: 5.3 g/dL — CL (ref 12.0–15.0)
MCH: 28.8 pg (ref 26.0–34.0)
MCHC: 34.2 g/dL (ref 30.0–36.0)
MCV: 84.2 fL (ref 78.0–100.0)
Platelets: 214 10*3/uL (ref 150–400)
RBC: 1.84 MIL/uL — ABNORMAL LOW (ref 3.87–5.11)
RDW: 14.8 % (ref 11.5–15.5)
WBC: 13.6 10*3/uL — ABNORMAL HIGH (ref 4.0–10.5)

## 2012-10-29 LAB — HEMOGLOBIN AND HEMATOCRIT, BLOOD
HCT: 20.2 % — ABNORMAL LOW (ref 36.0–46.0)
Hemoglobin: 7.1 g/dL — ABNORMAL LOW (ref 12.0–15.0)

## 2012-10-29 LAB — PREPARE RBC (CROSSMATCH)

## 2012-10-29 MED ORDER — FUROSEMIDE 10 MG/ML IJ SOLN
20.0000 mg | Freq: Once | INTRAMUSCULAR | Status: AC
  Administered 2012-10-29: 20 mg via INTRAVENOUS
  Filled 2012-10-29: qty 2

## 2012-10-29 MED ORDER — DIPHENHYDRAMINE HCL 25 MG PO CAPS
25.0000 mg | ORAL_CAPSULE | Freq: Once | ORAL | Status: AC
  Administered 2012-10-29: 25 mg via ORAL

## 2012-10-29 MED ORDER — ACETAMINOPHEN 325 MG PO TABS
650.0000 mg | ORAL_TABLET | Freq: Once | ORAL | Status: AC
  Administered 2012-10-29: 650 mg via ORAL
  Filled 2012-10-29: qty 2

## 2012-10-29 NOTE — Lactation Note (Signed)
This note was copied from the chart of Angie Maliaka Hulsey. Lactation Consultation Note: Initial visit with mom. She reports that baby has been nursing well. Latching well with no problems. LS by RN 8. BF brochure given with resources for support after DC. No questions at present. To call prn  Patient Name: Angie [name] [id number] Date: 10/29/2012 Reason for consult: Initial assessment   Maternal Data Formula Feeding for Exclusion: Yes Reason for exclusion: Mother's choice to formula and breast feed on admission Infant to breast within first hour of birth: Yes Does the patient have breastfeeding experience prior to this delivery?: Yes  Feeding Feeding Type: Breast Milk Feeding method: Breast  LATCH Score/Interventions Lactation Tools Discussed/Used     Consult Status Consult Status: PRN    Pamelia Hoit 10/29/2012, 10:47 AM
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
## 2012-10-29 NOTE — Anesthesia Postprocedure Evaluation (Signed)
  Anesthesia Post-op Note  Patient: Angie [name]  Procedure(s) Performed: Procedure(s): Repeat cesarean section with delivery of baby boy at 1210. Apgars 8/9.  BILATERAL TUBAL LIGATION (Bilateral)  Patient Location: Mother/Baby  Anesthesia Type:Spinal  Level of Consciousness: awake and alert   Airway and Oxygen Therapy: Patient Spontanous Breathing  Post-op Pain: mild  Post-op Assessment: Patient's Cardiovascular Status Stable, Respiratory Function Stable, No signs of Nausea or vomiting, Pain level controlled, No headache, No residual numbness and No residual motor weakness  Post-op Vital Signs: Reviewed and stable  Complications: No apparent anesthesia complications

## 2012-10-29 NOTE — Progress Notes (Signed)
Subjective: Postpartum Day 1: Classical Cesarean Delivery Patient reports incisional pain and tolerating PO. Dizzy with headache, feels weak  Objective: Vital signs in last 24 hours: Temp:  [97.4 F (36.3 C)-98.5 F (36.9 C)] 97.9 F (36.6 C) (07/05 2019) Pulse Rate:  [69-132] 132 (07/06 0627) Resp:  [13-22] 18 (07/06 0627) BP: (85-119)/(57-87) 102/65 mmHg (07/06 0627) SpO2:  [94 %-100 %] 100 % (07/06 0609) Weight:  [187 lb (84.823 kg)] 187 lb (84.823 kg) (07/05 1500)  Physical Exam:  General: alert, cooperative and no distress Lochia: appropriate Uterine Fundus: firm Incision: dressing dry DVT Evaluation: No evidence of DVT seen on physical exam.   Recent Labs  10/28/12 2007 10/29/12 0605  HGB 6.8* 5.3*  HCT 20.0* 15.5*    Assessment/Plan: Status post Cesarean section.  Classical Transfuse 2 units for symptomatic anemia Lasix between and after units I told patient during surgery likely need for transfusion and she agrees with plan.  Edder Bellanca H 10/29/2012, 7:29 AM

## 2012-10-30 ENCOUNTER — Encounter (HOSPITAL_COMMUNITY): Payer: Self-pay | Admitting: Obstetrics & Gynecology

## 2012-10-30 MED ORDER — OXYCODONE HCL 5 MG PO TABS
5.0000 mg | ORAL_TABLET | Freq: Once | ORAL | Status: AC
  Administered 2012-10-30: 5 mg via ORAL
  Filled 2012-10-30: qty 1

## 2012-10-30 MED ORDER — HYDROMORPHONE HCL 2 MG PO TABS
4.0000 mg | ORAL_TABLET | ORAL | Status: DC | PRN
  Administered 2012-10-30 (×3): 4 mg via ORAL
  Filled 2012-10-30: qty 1
  Filled 2012-10-30 (×2): qty 2
  Filled 2012-10-30: qty 1

## 2012-10-30 MED ORDER — OXYCODONE-ACETAMINOPHEN 5-325 MG PO TABS
1.0000 | ORAL_TABLET | ORAL | Status: DC | PRN
  Administered 2012-10-30 (×2): 1 via ORAL
  Administered 2012-10-31 (×2): 2 via ORAL
  Filled 2012-10-30: qty 2

## 2012-10-30 NOTE — Progress Notes (Signed)
Spoke with Dr. Benjamin Stain regarding patient's request for stronger pain medication. MD to place order for med. Will continue to closely monitor patient.

## 2012-10-30 NOTE — Lactation Note (Signed)
This note was copied from the chart of Boy Jessikah Capistran. Lactation Consultation Note  Patient Name: Boy Britton Byard ZOXWR'U Date: 10/30/2012 Reason for consult: Follow-up assessment Baby was at the end of a feeding when I arrived. Baby had a shallow latch, some dimpling noted with suckling. Mom reports baby is nursing well, she was resistant to assistance with positioning by LC. Mom was open to teaching. Discussed importance of deep latch to milk transfer. Baby is at 9% weight loss, voids/stools adequate at this time. Mom reports baby is now nursing for longer periods of time, she denies tenderness with the baby at the breast. BF basics reviewed, encouraged Mom to monitor voids/stools.  Advised if weight loss increases we may need to pump or start supplements. Discussed with Mom her low hgb (started at 6.8, 5.3 after delivery, then 7.1 after 2 units PRBC) may result in delay in her milk coming in. Offered to set up a DEBP for her to post pump to encourage milk production. Mom declined at this time. If baby's weight decreases tonight Mom reports she will have RN set up pump for her.  Advised to ask for assist if desired.   Maternal Data    Feeding Feeding Type: Breast Milk Feeding method: Breast Length of feed: 15 min  LATCH Score/Interventions       Type of Nipple: Everted at rest and after stimulation  Comfort (Breast/Nipple): Soft / non-tender           Lactation Tools Discussed/Used     Consult Status Consult Status: Follow-up Date: 10/31/12 Follow-up type: In-patient    Alfred Levins 10/30/2012, 3:52 PM

## 2012-10-30 NOTE — Progress Notes (Addendum)
Post Partum / postoperative Day 2 Subjective: up ad lib, voiding, tolerating PO, + flatus and bleeding less than a period now Denies chest pain or shortness of breath Complains of "splitting headache" and incisional pain  Objective: Blood pressure 127/75, pulse 88, temperature 98.1 F (36.7 C), temperature source Oral, resp. rate 19, weight 84.823 kg (187 lb), last menstrual period 01/28/2012, SpO2 97.00%, unknown if currently breastfeeding.  Physical Exam:  General: alert, cooperative, appears stated age and no distress Lochia: appropriate Uterine Fundus: firm Incision: dressing in place, moderate amount of drainage seen under top layer of dry dressing but not changed since c-section (LT scar, classical uterine incision) DVT Evaluation: No evidence of DVT seen on physical exam. No cords or calf tenderness. No significant calf/ankle edema. Neuro: Alert, talkative, no focal deficits   Recent Labs  10/29/12 0605 10/29/12 1706  HGB 5.3* 7.1*  HCT 15.5* 20.2*    Assessment/Plan: Status post c-section, classical Plan for discharge tomorrow, Breastfeeding and Contraception s/p BTL Will need to provide circumcision options on discharge S/p transfusion 2 units 7/6 for symptomatic anemia, dosed lasix between and after units SW consulted for h/o suicide attempt 1994 and thoughts of self-harm during this pregnancy along with multiple psychosocial stressors including needing help with baby supplies - Sees local MD to manage her prozac - F/u to assure local counseling -  SW consulted and providing local resources - Denies current issues with depression   LOS: 2 days   Simone Curia 10/30/2012, 7:23 AM

## 2012-10-31 MED ORDER — OXYCODONE HCL 5 MG PO TABS
5.0000 mg | ORAL_TABLET | ORAL | Status: DC | PRN
  Administered 2012-10-31 (×2): 5 mg via ORAL
  Filled 2012-10-31 (×2): qty 1

## 2012-10-31 MED ORDER — SENNOSIDES-DOCUSATE SODIUM 8.6-50 MG PO TABS: 2.0000 | ORAL_TABLET | Freq: Every day | ORAL | Status: DC

## 2012-10-31 MED ORDER — OXYCODONE-ACETAMINOPHEN 5-325 MG PO TABS: 2.0000 | ORAL_TABLET | ORAL | Status: DC | PRN

## 2012-10-31 MED ORDER — BUTALBITAL-APAP-CAFFEINE 50-325-40 MG PO TABS
2.0000 | ORAL_TABLET | Freq: Four times a day (QID) | ORAL | Status: DC | PRN
  Filled 2012-10-31 (×2): qty 1

## 2012-10-31 MED ORDER — OXYCODONE-ACETAMINOPHEN 5-325 MG PO TABS
2.0000 | ORAL_TABLET | Freq: Once | ORAL | Status: AC
  Administered 2012-10-31: 2 via ORAL
  Filled 2012-10-31: qty 2

## 2012-10-31 MED ORDER — SODIUM CHLORIDE 0.9 % IV BOLUS (SEPSIS)
500.0000 mL | Freq: Once | INTRAVENOUS | Status: AC
  Administered 2012-10-31: 500 mL via INTRAVENOUS

## 2012-10-31 MED ORDER — IBUPROFEN 800 MG PO TABS: 800.0000 mg | ORAL_TABLET | Freq: Three times a day (TID) | ORAL | Status: DC | PRN

## 2012-10-31 MED ORDER — COSYNTROPIN 0.25 MG IJ SOLR
0.7500 mg | Freq: Once | INTRAMUSCULAR | Status: AC
  Administered 2012-10-31: 0.75 mg via INTRAVENOUS
  Filled 2012-10-31: qty 0.75

## 2012-10-31 NOTE — Consult Note (Signed)
CC: Headache  HPI: [name] is  Pleasant lady post op s/p c-section on 7/5.  She complains of a persistent headache since the day of surgery. She also has a history of migraines and she states that this headache is similar but more severe and consistent.  She states that "maybe" the headache is worse sitting up or standing, but prefers to sit up so that there is no stretch on her incisional area.  She has no other neurological history.  She thinks that it is possible that the headache is temporally related to her transfusion. She denies fever, nuchal rigidity, diplopia, or other constitutional symptoms.  She notes mild photophobia but she is sitting up in bed with the lights and the TV on.  ROS: otherwise negative other than what is discussed in the HPI   Physical Exam: GEN: well developed.  In mild distress HEENT: atraumatic; extraocular movements intact CV: RRR; No murmurs PULM: Clear Neuro: No focal abnormalities  Allergies: none  Assessment and Plan: Cannot rule out dural puncture headache, but more likely complex migraine.  I discussed treatment options with the patient and the OB faculty.  Patient would like to forego blood patch, but know the risks and benefits and we are willing to reconsider this option as she would like.  1) Fioricet 2) Switch current pain medication to oxycodone to make sure she does not have an overdose on acetaminophen 3) IV fluid bolus 4) Cosyntropin X 1  I spend 30 minutes seeing the patient and discussing the care for this consult

## 2012-10-31 NOTE — Progress Notes (Signed)
RR called for patient having difficulty breathing, upon arrival to room patient on non-rebreather, sats 100%, patient talking. Patient appears anxious but states "breathing is better now". Patient c/o headache, received medication a little while earlier for headache, room lights dim. Resident at bedside, spoke to patient after speaking with MD, patient aware of future care plan and pain medications. On room air with sats 100% upon RR nurse leaving.

## 2012-10-31 NOTE — Progress Notes (Addendum)
Patient stated, when this RN was giving patient 1800 medications, that the pain she is in is not new or any worse than what she has chronically experienced prior to this pregnancy.  She states that she has been limited by her MD's during the pregnancy on what medications she could take to help manage this chronic pain.  Patient questioning breastfeeding vs. bottle so that she could take the medications that are "stronger" and wondered if her MD would give her something stronger tonight.  This was passed onto the attending MD.  Also, patient stated that her BP's were higher and we discussed that pain can often increase BP.  Patient did not have any other sx of hypertension except headache, which was present since admission and her blood pressures have gone up since patient switched to oxycodone IR from percocet (which patient states percocet controls her pain more and is the medication that her MD plans to send her home on).  Also, patient's BP did go down to 143/91.  Patient states that this pain is no worse than the pain she is in chronically and patient is able to sit up, walk around room, eat, smile, text, watch TV, attend to the baby and talk with visitors.  Baby's NAS score, at 1630 this eve, went from 1 to 10.  Pediatrician informed, along with the change in medications the patient has taken today.  Patient refused to take 1800 dosage of fioricet, but did take motrin.  MD's stated since patient is experiencing chronic pain present prior to this pregnancy and that her BP only went up since patient has stopped the percocets, they are comfortable discharging patient with percocet and proper follow up with MD to further work on chronic pain management.  Also, this RN would like to note that patient also did state at 1800 that she has taken up to 25 mg of oxycodone IR in 1 dose to decrease her chronic pain.

## 2012-10-31 NOTE — Progress Notes (Signed)
MD ordered to give 2 oxycodone now. Will continue to monitor.

## 2012-10-31 NOTE — Progress Notes (Signed)
Called by nurse for patient reporting difficulty breathing  Subjective: Postpartum Day 3: Cesarean Delivery Patient reports difficulty breathing, headache, and RLQ pain near incision. Patient explained that anytime her pain meds are changed her pain becomes uncontrolled. Describes the incisional pain as dull and then sharp when palpated. Has a history of migraine headaches but says this one is "worse". Breathing normally on room air after calming down.    Objective: Vital signs in last 24 hours: Temp:  [98.2 F (36.8 C)-98.3 F (36.8 C)] 98.3 F (36.8 C) (07/08 0606) Pulse Rate:  [78-91] 78 (07/08 1351) Resp:  [18-20] 20 (07/08 1321) BP: (125-160)/(70-89) 138/76 mmHg (07/08 1351) SpO2:  [100 %] 100 % (07/08 1341)  Physical Exam:  General: alert and mild distress Heart: Regular rate and rhythm, normal S1/S2 Lungs: Good inspiratory effort, clear to auscultation bilaterally Uterine Fundus: firm Abdomen: some tenderness around the right side of incision Incision: no significant drainage, no significant erythema DVT Evaluation: No significant calf/ankle edema.   Recent Labs  10/29/12 0605 10/29/12 1706  HGB 5.3* 7.1*  HCT 15.5* 20.2*    Assessment/Plan: Status post Cesarean section. -Breathing normally after a few minutes of oxygen and switched to room air, saturating 99-100%. BP did elevate but came down and patient was not in distress after talking with her. -Explained to her that she will have some pain because of her surgery, that the switch from percocet to oxycodone is the same medication just without the tylenol, and that she should give the fioricet/oxy some time to work  I spoke with the nurses and told them to not hesitate to call for further assistance.  Tawni Carnes 10/31/2012, 1:55 PM

## 2012-10-31 NOTE — Progress Notes (Signed)
Obstetrics progress note - post-discharge.  Patient was discharged earlier today but infant is still in hospital.  Called by nurse on Mother-Baby who states patient's family complained to her that rx written on discharge for percocet was taken by patient's pharmacy but they sent her to another [facility] all-night pharmacy due to the initial pharmacy closing. The all-night pharmacy could however not fill rx without the paper prescription, which is now at the pharmacy which is closed. Nurse manager Steward Drone spoke with pharmacy tech at [employer], who told her he could see that patient dropped off paper rx at the initial [facility] and asked if we could rx percocet 4 tabs to get patient through the night until her pharmacy opened in AM, due to this being an error on the initial [facility] pharmacy tech's part.  - Printed prescription for percocet 5-325mg  #4 tablets after confirming the above with nurse manager Steward Drone, and gave to floor secretary.  Leona Singleton, MD 11/01/2012 12:03 AM

## 2012-10-31 NOTE — Progress Notes (Signed)
Subjective: Postpartum Day 3: Cesarean Delivery Patient reports incisional pain.    Objective: Vital signs in last 24 hours: Temp:  [98.3 F (36.8 C)-98.6 F (37 C)] 98.6 F (37 C) (07/08 1721) Pulse Rate:  [78-99] 90 (07/08 1820) Resp:  [18-20] 18 (07/08 1721) BP: (125-169)/(70-93) 143/91 mmHg (07/08 1820) SpO2:  [100 %] 100 % (07/08 1341)  Physical Exam:  General: alert and no distress Lochia: appropriate Uterine Fundus: firm Incision: no significant drainage, no significant erythema, area of drainage that was penmarked earlier is stable DVT Evaluation: No significant calf/ankle edema.   Recent Labs  10/29/12 0605 10/29/12 1706  HGB 5.3* 7.1*  HCT 15.5* 20.2*    Assessment/Plan: Status post Cesarean section.    Discharge home with standard precautions and return to clinic in 4-6 weeks.  Patient refused fioricet on discharge, preferred to stay on just percocet 5/325.  Stable for discharge, staying with baby until pediatrician discharges  Tawni Carnes 10/31/2012, 7:07 PM

## 2012-10-31 NOTE — Discharge Summary (Signed)
Obstetric Discharge Summary Reason for Admission: cesarean section, repeat, and BTL  Prenatal Procedures: 6/10 OB ultarsound fetal BPP w/o nonstress and 6/27 OB ultrasound Intrapartum Procedures: cesarean: classical and tubal ligation Postpartum Procedures: transfusion 2 units pRBCs for symptomatic anemia Complications-Operative and Postpartum: spinal headache Hemoglobin  Date Value Range Status  10/29/2012 7.1* 12.0 - 15.0 g/dL Final     REPEATED TO VERIFY     DELTA CHECK NOTED     POST TRANSFUSION SPECIMEN     HCT  Date Value Range Status  10/29/2012 20.2* 36.0 - 46.0 % Final    Physical Exam:  General: alert, cooperative, appears stated age and no distress Lochia: appropriate Uterine Fundus: firm Incision: healing well, no significant drainage, no dehiscence, no significant erythema with honeycomb in place DVT Evaluation: No evidence of DVT seen on physical exam. No cords or calf tenderness. No significant calf/ankle edema. NABS  Discharge Diagnoses: Term Pregnancy-delivered , s/p classical c-section  Discharge Information: Date: 10/31/2012 Activity: pelvic rest Diet: routine Medications: Ibuprofen, Colace and Percocet Condition: stable Instructions: refer to practice specific booklet Discharge to: home Breastfeeding Contraception: s/p BTL Will provide circumcision options SW consulted for h/o suicide attempt 1994 and thoughts of self-harm during this pregnancy along with multiple psychosocial stressors including needing help with baby supplies  - Sees local MD to manage her prozac  - F/u to assure local counseling - SW consulted and providing local resources  - Denies current issues with depression  Newborn Data: Live born female  Birth Weight: 7 lb 4.6 oz (3305 g) APGAR: 8, 9  Home with mother.  Angie [name] 10/31/2012, 7:50 AM Evaluation and management procedures were performed by Resident physician under my supervision/collaboration. Chart reviewed, patient  examined by me and I agree with management and plan. Baby will not be discharged due to weight loss. LC here.  Anesthesia to see pt for H/A. Will d/c to rooming in status later today.  Danae Orleans, CNM 10/31/2012 9:56 AM

## 2012-10-31 NOTE — Progress Notes (Signed)
Pt told this RN that she needs talk  with DR. In am regarding her H/A for 3 days. Pain scale 10 at present. Pt stated she needs a CT of the head or something. Pt states this H/A is not normal. Pt history of medication use, 2 units of blood transfused and spinal anesthesia. Dr. Lanelle Bal  pg'd and phoned and notified of pt comments.

## 2012-10-31 NOTE — Progress Notes (Addendum)
CSW made [facility] referral, as requested by weekend CSW.  CSW offered counseling referrals to pt however she declined.  Pt told CSW that she & her daughters are receiving intensive in-home (family/individual) counseling services from [facility].  CSW provided pt with a business card & encouraged her to call if she would like alternative counseling services.  Pt appeared anxious during brief conversation.  She held the sides of her head, as if she was in pain.  CSW verified that pt has not had any CPS involvement in 2 years.  CSW available to assist further if needed.

## 2012-10-31 NOTE — Plan of Care (Signed)
Problem: Discharge Progression Outcomes Goal: Pain controlled with appropriate interventions Outcome: Adequate for Discharge Patient has chronic pain issues that she has managed prior to this pregnancy.  She has been encouraged to set up follow up to work on long term pain management.

## 2012-10-31 NOTE — Discharge Instructions (Signed)
 Congratulations on your delivery!  -Please make your 6 week follow-up appointment -Please refrain from any pelvic activity (anything in the vagina/intercourse) for 6 weeks. -If you develop increased bleeding, fever, chest pain, shortness of breath, or any other concerning symptoms, please seek immediate medical attention. - you can remove your honeycomb bandage in 2 days  [facility] Outpatient Circumcision Options (prices listed as well): - [facility] [phone] within 4 weeks of delivery) [facility] [phone] ([phone] within 7 days of delivery)  - [facility] [phone] ($175 within 2 weeks of delivery) [facility] ($480 has to be paid prior to procedure)  Cesarean Delivery Care After Refer to this sheet in the next few weeks. These instructions provide you with information on caring for yourself after your procedure. Your caregiver may also give you specific instructions. Your treatment has been planned according to current medical practices, but problems sometimes occur. Call your caregiver if you have any problems or questions after you go home. HOME CARE INSTRUCTIONS  Only take over-the-counter or prescription medicines as directed by your caregiver.  Do not drink alcohol, especially if you are breastfeeding or taking medicine to relieve pain.  Do not chew or smoke tobacco.  Continue to use good perineal care. Good perineal care includes:  Wiping your perineum from front to back.  Keeping your perineum clean.  Check your cut (incision) daily for increased redness, drainage, swelling, or separation of skin.  Clean your incision gently with soap and water every day, and then pat it dry. If your caregiver says it is okay, leave the incision uncovered. Use a bandage (dressing) if the incision is draining fluid or appears irritated. If the adhesive strips across the incision do not fall off within 7 days, carefully peel them off.  Hug a pillow when  coughing or sneezing until your incision is healed. This helps to relieve pain.  Do not use tampons or douche until your caregiver says it is okay.  Shower, wash your hair, and take tub baths as directed by your caregiver.  Wear a well-fitting bra that provides breast support.  Limit wearing support panties or control-top hose.  Drink enough fluids to keep your urine clear or pale yellow.  Eat high-fiber foods such as whole grain cereals and breads, brown rice, beans, and fresh fruits and vegetables every day. These foods may help prevent or relieve constipation.  Resume activities such as climbing stairs, driving, lifting, exercising, or traveling as directed by your caregiver.  Talk to your caregiver about resuming sexual activities. This is dependent upon your risk of infection, your rate of healing, and your comfort and desire to resume sexual activity.  Try to have someone help you with your household activities and your newborn for at least a few days after you leave the hospital.  Rest as much as possible. Try to rest or take a nap when your newborn is sleeping.  Increase your activities gradually.  Keep all of your scheduled postpartum appointments. It is very important to keep your scheduled follow-up appointments. At these appointments, your caregiver will be checking to make sure that you are healing physically and emotionally. SEEK MEDICAL CARE IF:   You are passing large clots from your vagina. Save any clots to show your caregiver.  You have a foul smelling discharge from your vagina.  You have trouble urinating.  You are urinating frequently.  You have pain when you urinate.  You have a change in your bowel movements.  You have increasing redness, pain, or swelling near your incision.  You have pus draining from your incision.  Your incision is separating.  You have painful, hard, or reddened breasts.  You have a severe headache.  You have blurred  vision or see spots.  You feel sad or depressed.  You have thoughts of hurting yourself or your newborn.  You have questions about your care, the care of your newborn, or medicines.  You are dizzy or lightheaded.  You have a rash.  You have pain, redness, or swelling at the site of the removed intravenous access (IV) tube.  You have nausea or vomiting.  You stopped breastfeeding and have not had a menstrual period within 12 weeks of stopping.  You are not breastfeeding and have not had a menstrual period within 12 weeks of delivery.  You have a fever. SEEK IMMEDIATE MEDICAL CARE IF:  You have persistent pain.  You have chest pain.  You have shortness of breath.  You faint.  You have leg pain.  You have stomach pain.  Your vaginal bleeding saturates 2 or more sanitary pads in 1 hour. MAKE SURE YOU:   Understand these instructions.  Will watch your condition.  Will get help right away if you are not doing well or get worse. Document Released: 01/02/2002 Document Revised: 01/05/2012 Document Reviewed: 12/08/2011 [facility] Patient Information 2014 [place], [place].

## 2012-10-31 NOTE — Progress Notes (Signed)
UR chart review completed.  

## 2012-11-01 NOTE — Progress Notes (Signed)
Called to pt.'s room, pt. Stating she can not breath. BP 152/83 O2 sats 98-100%. RR called. Pt c/o headache x 4 days. Pt appears very anxious and states she has chronic pain and hx of migraines. She also states her meds have been switched and each time they are switched her pain is uncontrolled. Pt was given medication at 1200 for headache and stated at that time that she did not think it was a strong enough dose to control her pain. Pt calmed down after resident talked with her. BP at 1351 138/76 O2 sats 99-100%

## 2012-11-01 NOTE — Progress Notes (Signed)
Saw patient this morning.  I asked her about her headache.  She didn't want to discuss the headache, but voiced several complaints about prescription medications, her wound site, feet swelling, and her blood pressure prior to being discharged.  I communicated her concerns to the primary team and they said that they would see her although she has already been discharged (she is boarding for ongoing care of the newborn).  I deferred her questions and assured her that someone would come by to address her concerns.  She did not have any questions about her headache.

## 2012-11-02 LAB — TYPE AND SCREEN
ABO/RH(D): B POS
Antibody Screen: NEGATIVE
Unit division: 0
Unit division: 0
Unit division: 0
Unit division: 0

## 2012-11-03 ENCOUNTER — Encounter (HOSPITAL_COMMUNITY): Payer: Self-pay | Admitting: *Deleted

## 2012-11-03 ENCOUNTER — Inpatient Hospital Stay (HOSPITAL_COMMUNITY)
Admission: AD | Admit: 2012-11-03 | Discharge: 2012-11-03 | Disposition: A | Discharge status: Home / Self Care | Payer: Medicare HMO | Source: Ambulatory Visit | Attending: Obstetrics & Gynecology | Admitting: Obstetrics & Gynecology

## 2012-11-03 DIAGNOSIS — L7682 Other postprocedural complications of skin and subcutaneous tissue: Secondary | ICD-10-CM

## 2012-11-03 DIAGNOSIS — R030 Elevated blood-pressure reading, without diagnosis of hypertension: Secondary | ICD-10-CM | POA: Insufficient documentation

## 2012-11-03 DIAGNOSIS — O99893 Other specified diseases and conditions complicating puerperium: Secondary | ICD-10-CM | POA: Insufficient documentation

## 2012-11-03 DIAGNOSIS — O909 Complication of the puerperium, unspecified: Secondary | ICD-10-CM | POA: Insufficient documentation

## 2012-11-03 LAB — CBC
HCT: 23.4 % — ABNORMAL LOW (ref 36.0–46.0)
Hemoglobin: 7.9 g/dL — ABNORMAL LOW (ref 12.0–15.0)
MCH: 29 pg (ref 26.0–34.0)
MCHC: 33.8 g/dL (ref 30.0–36.0)
MCV: 86 fL (ref 78.0–100.0)
Platelets: 233 10*3/uL (ref 150–400)
RBC: 2.72 MIL/uL — ABNORMAL LOW (ref 3.87–5.11)
RDW: 15.3 % (ref 11.5–15.5)
WBC: 6.4 10*3/uL (ref 4.0–10.5)

## 2012-11-03 LAB — COMPREHENSIVE METABOLIC PANEL
ALT: 12 U/L (ref 0–35)
AST: 10 U/L (ref 0–37)
Albumin: 2.2 g/dL — ABNORMAL LOW (ref 3.5–5.2)
Alkaline Phosphatase: 74 U/L (ref 39–117)
BUN: 10 mg/dL (ref 6–23)
CO2: 22 meq/L (ref 19–32)
Calcium: 8.1 mg/dL — ABNORMAL LOW (ref 8.4–10.5)
Chloride: 107 mEq/L (ref 96–112)
Creatinine, Ser: 0.57 mg/dL (ref 0.50–1.10)
GFR calc Af Amer: >90 mL/min (ref >90)
GFR calc non Af Amer: >90 mL/min (ref >90)
Glucose, Bld: 90 mg/dL (ref 70–99)
Potassium: 4.1 mEq/L (ref 3.5–5.1)
Sodium: 138 mEq/L (ref 135–145)
Total Bilirubin: 0.2 mg/dL — ABNORMAL LOW (ref 0.3–1.2)
Total Protein: 5.1 g/dL — ABNORMAL LOW (ref 6.0–8.3)

## 2012-11-03 LAB — PROTEIN / CREATININE RATIO, URINE
Creatinine, Urine: 86.99 mg/dL
Protein Creatinine Ratio: 0.09 (ref 0.00–0.15)
Total Protein, Urine: 7.5 mg/dL

## 2012-11-03 MED ORDER — HYDROMORPHONE HCL 2 MG PO TABS: 4.0000 mg | ORAL_TABLET | ORAL | Status: DC | PRN

## 2012-11-03 MED ORDER — SENNOSIDES-DOCUSATE SODIUM 8.6-50 MG PO TABS: 2.0000 | ORAL_TABLET | Freq: Every day | ORAL | Status: DC

## 2012-11-03 MED ORDER — OXYCODONE-ACETAMINOPHEN 5-325 MG PO TABS: 2.0000 | ORAL_TABLET | Freq: Four times a day (QID) | ORAL | Status: DC | PRN

## 2012-11-03 MED ORDER — OXYCODONE-ACETAMINOPHEN 5-325 MG PO TABS
2.0000 | ORAL_TABLET | Freq: Once | ORAL | Status: AC
  Administered 2012-11-03: 2 via ORAL
  Filled 2012-11-03: qty 2

## 2012-11-03 NOTE — MAU Provider Note (Signed)
History     CSN: [id number]  Arrival date and time: 11/03/12 1333   First Provider Initiated Contact with Patient 11/03/12 1451      No chief complaint on file.  HPI 47 y.o. [id number] delivered by c-section on 7/5 at [redacted]w[redacted]d. Has been in constant pain along incision, especially left, and "inside underneath". Patient unhappy with how she was discharged, did not think directions were clear about follow up and did not like her pain medications being changed from percocet to oxycodone and fioricet. Patient currently has staples in, but does not want them removed today in the MAU, wants to have them removed at the clinic next week. Has had a headache since delivery, though it is much better recently. Also has pain in arms from the IV and edema that she noticed since delivery. She used all 30 tabs of percocet prescribed at discharge. She denies constipation and says she is taking the senna. Denies chest pain, SOB, dizziness, changes in vision.  OB History   Grav Para Term Preterm Abortions TAB SAB Ect Mult Living   6 4 4  0 2  2   4     C-section on 10/28/12 involved fibroma, extensive adhesions, switched to classical  Past Medical History  Diagnosis Date  . Chronic pain   . Bulging lumbar disc   . Abnormal Pap smear   . Anxiety     Takes xanax  . Arthritis     Has rx for percocet  . Migraines   . Anemia     Past Surgical History  Procedure Laterality Date  . Cesarean section      X 3  . Cervical cone biopsy    . Gastric bypass  2007  . Cesarean section with bilateral tubal ligation Bilateral 10/28/2012    Procedure: Repeat cesarean section with delivery of baby boy at 1210. Apgars 8/9.  BILATERAL TUBAL LIGATION;  Surgeon: Lazaro Arms, MD;  Location: [facility];  Service: Obstetrics;  Laterality: Bilateral;    Family History  Problem Relation Age of Onset  . Diabetes Father   . Depression Maternal Grandmother   . Heart disease Maternal Grandfather   . Depression Paternal Grandmother      History  Substance Use Topics  . Smoking status: Former Smoker -- 0.50 packs/day for 10 years    Types: Cigarettes    Quit date: 03/16/2012  . Smokeless tobacco: Never Used  . Alcohol Use: No     Comment: Had "issues" then socially, now none    Allergies: No Known Allergies  Prescriptions prior to admission  Medication Sig Dispense Refill  . BIOTIN PO Take 1 tablet by mouth daily.      . ferrous sulfate 325 (65 FE) MG tablet Take 325 mg by mouth 3 (three) times a week. Monday, Wednesday, Friday      . ibuprofen (ADVIL,MOTRIN) 800 MG tablet Take 1 tablet (800 mg total) by mouth every 8 (eight) hours as needed for pain.  30 tablet  0  . oxyCODONE-acetaminophen (PERCOCET/ROXICET) 5-325 MG per tablet Take 2 tablets by mouth every 4 (four) hours as needed for pain.  30 tablet  0  . polyethylene glycol (MIRALAX / GLYCOLAX) packet Take 17 g by mouth daily as needed (constipation).      . Prenatal Vit-Fe Fumarate-FA (PRENATAL MULTIVITAMIN) TABS Take 1 tablet by mouth daily at 12 noon.      . senna-docusate (SENOKOT-S) 8.6-50 MG per tablet Take 2 tablets by mouth at bedtime.  30  tablet  0    ROS negative except as above  Physical Exam   Blood pressure 154/83, pulse 79, temperature 98.1 F (36.7 C), temperature source Oral, resp. rate 18, last menstrual period 01/28/2012, currently breastfeeding.  Physical Exam General appearance: alert, cooperative and no distress Head: Normocephalic, without obvious abnormality, atraumatic Lungs: clear to auscultation bilaterally Heart: regular rate and rhythm, S1, S2 normal, no murmur, click, rub or gallop Abdomen: tenderness to palpation in epigastrium, left lower quadrant, around incision Extremities: no edema, redness or tenderness in the calves or thighs Pulses: 2+ and symmetric Incision: bandage still on, honeycomb has some serosanguinous drainage. Bandage removed, incision clean without erythema or fluid discharge, healing well. Staples  still present.   MAU Course  Procedures Results for orders placed during the hospital encounter of 11/03/12 (from the past 24 hour(s))  CBC     Status: Abnormal   Collection Time    11/03/12  3:45 PM      Result Value Range   WBC 6.4  4.0 - 10.5 K/uL   RBC 2.72 (*) 3.87 - 5.11 MIL/uL   Hemoglobin 7.9 (*) 12.0 - 15.0 g/dL   HCT 40.9 (*) 81.1 - 91.4 %   MCV 86.0  78.0 - 100.0 fL   MCH 29.0  26.0 - 34.0 pg   MCHC 33.8  30.0 - 36.0 g/dL   RDW 78.2  95.6 - 21.3 %   Platelets 233  150 - 400 K/uL  COMPREHENSIVE METABOLIC PANEL     Status: Abnormal   Collection Time    11/03/12  3:45 PM      Result Value Range   Sodium 138  135 - 145 mEq/L   Potassium 4.1  3.5 - 5.1 mEq/L   Chloride 107  96 - 112 mEq/L   CO2 22  19 - 32 mEq/L   Glucose, Bld 90  70 - 99 mg/dL   BUN 10  6 - 23 mg/dL   Creatinine, Ser 0.86  0.50 - 1.10 mg/dL   Calcium 8.1 (*) 8.4 - 10.5 mg/dL   Total Protein 5.1 (*) 6.0 - 8.3 g/dL   Albumin 2.2 (*) 3.5 - 5.2 g/dL   AST 10  0 - 37 U/L   ALT 12  0 - 35 U/L   Alkaline Phosphatase 74  39 - 117 U/L   Total Bilirubin 0.2 (*) 0.3 - 1.2 mg/dL   GFR calc non Af Amer >90  >90 mL/min   GFR calc Af Amer >90  >90 mL/min  PROTEIN / CREATININE RATIO, URINE     Status: None   Collection Time    11/03/12  3:45 PM      Result Value Range   Creatinine, Urine 86.99     Total Protein, Urine 7.5     PROTEIN CREATININE RATIO 0.09  0.00 - 0.15    MDM   Assessment and Plan   # Elevated BP - CBC/CMP/Urine Pr:Cr all normal - BP likely elevated due to patient pain - Rx for 30 tabs 5-325 percocet given for incisional pain  # Staple removal - Patient refuses removal of staples today, despite several offers to remove while here in MAU. Says she will call on Monday to schedule appt to get them removed.  Patient agrees with plan and will call for appt  Stable for discharge  Tawni Carnes 11/03/2012, 3:30 PM

## 2012-11-03 NOTE — Discharge Instructions (Signed)
 Please call on Monday 7/14 to schedule an appointment for as soon as possible to get your staples removed. You also need to schedule a follow up appointment for 6 weeks c-section follow-up check.  Postpartum Care After Cesarean Delivery After you deliver your newborn (postpartum period), the usual stay in the hospital is 24 72 hours. If there were problems with your labor or delivery, or if you have other medical problems, you might be in the hospital longer.  While you are in the hospital, you will receive help and instructions on how to care for yourself and your newborn during the postpartum period.  While you are in the hospital:  It is normal for you to have pain or discomfort from the incision in your abdomen. Be sure to tell your nurses when you are having pain, where the pain is located, and what makes the pain worse.  If you are breastfeeding, you may feel uncomfortable contractions of your uterus for a couple of weeks. This is normal. The contractions help your uterus get back to normal size.  It is normal to have some bleeding after delivery.  For the first 1 3 days after delivery, the flow is red and the amount may be similar to a period.  It is common for the flow to start and stop.  In the first few days, you may pass some small clots. Let your nurses know if you begin to pass large clots or your flow increases.  Do not  flush blood clots down the toilet before having the nurse look at them.  During the next 3 10 days after delivery, your flow should become more watery and pink or brown-tinged in color.  Ten to fourteen days after delivery, your flow should be a small amount of yellowish-white discharge.  The amount of your flow will decrease over the first few weeks after delivery. Your flow may stop in 6 8 weeks. Most women have had their flow stop by 12 weeks after delivery.  You should change your sanitary pads frequently.  Wash your hands thoroughly with soap and  water for at least 20 seconds after changing pads, using the toilet, or before holding or feeding your newborn.  Your intravenous (IV) tubing will be removed when you are drinking enough fluids.  The urine drainage tube (urinary catheter) that was inserted before delivery may be removed within 6 8 hours after delivery or when feeling returns to your legs. You should feel like you need to empty your bladder within the first 6 8 hours after the catheter has been removed.  In case you become weak, lightheaded, or faint, call your nurse before you get out of bed for the first time and before you take a shower for the first time.  Within the first few days after delivery, your breasts may begin to feel tender and full. This is called engorgement. Breast tenderness usually goes away within 48 72 hours after engorgement occurs. You may also notice milk leaking from your breasts. If you are not breastfeeding, do not stimulate your breasts. Breast stimulation can make your breasts produce more milk.  Spending as much time as possible with your newborn is very important. During this time, you and your newborn can feel close and get to know each other. Having your newborn stay in your room (rooming in) will help to strengthen the bond with your newborn. It will give you time to get to know your newborn and become comfortable caring for your  newborn.  Your hormones change after delivery. Sometimes the hormone changes can temporarily cause you to feel sad or tearful. These feelings should not last more than a few days. If these feelings last longer than that, you should talk to your caregiver.  If desired, talk to your caregiver about methods of family planning or contraception.  Talk to your caregiver about immunizations. Your caregiver may want you to have the following immunizations before leaving the hospital:  Tetanus, diphtheria, and pertussis (Tdap) or tetanus and diphtheria (Td) immunization. It is very  important that you and your family (including grandparents) or others caring for your newborn are up-to-date with the Tdap or Td immunizations. The Tdap or Td immunization can help protect your newborn from getting ill.  Rubella immunization.  Varicella (chickenpox) immunization.  Influenza immunization. You should receive this annual immunization if you did not receive the immunization during your pregnancy. Document Released: 01/05/2012 Document Reviewed: 01/05/2012 [facility] Patient Information 2014 [place], [place].

## 2012-11-03 NOTE — MAU Provider Note (Signed)
Attestation of Attending Supervision of Resident: Evaluation and management procedures were performed by the Family Medicine Resident under my supervision.  I have reviewed the resident's note and chart, and I agree with the management and plan.  Zamantha Strebel, MD, FACOG Attending Obstetrician & Gynecologist Faculty Practice, [facility]  

## 2012-11-03 NOTE — MAU Note (Addendum)
C/s on the 5th. Baby was in NICU.  They both came home yesterday.  Pt required a blood transfusion, had a fibroid removed and a tubal.  BP was real high.   Said did not get proper instructions- doesn't know what to do for incision. Or what to expect.  Has had constant HA since surgery.  Has been having pain and burning inside.  Is out of pain medication.

## 2012-11-08 ENCOUNTER — Ambulatory Visit: Payer: Medicare HMO | Admitting: Family

## 2012-11-08 ENCOUNTER — Telehealth: Payer: Self-pay | Admitting: Obstetrics and Gynecology

## 2012-11-08 VITALS — BP 145/90 | HR 68 | Temp 97.7°F | Ht 60.0 in | Wt 184.0 lb

## 2012-11-08 DIAGNOSIS — Z09 Encounter for follow-up examination after completed treatment for conditions other than malignant neoplasm: Secondary | ICD-10-CM

## 2012-11-08 NOTE — Telephone Encounter (Signed)
Patient called and was requesting Rf on pain med percocet. Per Dr. Macon Large patient is denied of request. She can use her Iburprofen 800 mg. Patient states understanding.

## 2012-11-08 NOTE — Progress Notes (Signed)
S:  Pt is here s/p csection for suture removal.  CSection on with BTL.  Hospital stay x 5 days due to blood pressure.  Not taking blood pressure medications at this time.   O:   Filed Vitals:   11/08/12 1117 11/08/12 1119 11/08/12 1120  BP: 153/98 154/90 145/90  Pulse: 72  68  Temp: 97.7 F (36.5 C)    TempSrc: Oral    Height: 5' (1.524 m)    Weight: 184 lb (83.462 kg)     Pt alert and oriented x 3; NAD Abdomen - incision site well healing, small portion 2.5 cm on right side of incision puckering , well healed below (Dr. Debroah Loop in to assess incision site), no signs of infection.    A:  Postop Check Elevated blood pressure P: Return in one week for BP check may consider starting medication Advised to clean site and keep dry. [facility]

## 2012-11-12 NOTE — Progress Notes (Signed)
Agree with assessment. 

## 2012-11-19 ENCOUNTER — Emergency Department (HOSPITAL_COMMUNITY)
Admission: EM | Admit: 2012-11-19 | Discharge: 2012-11-19 | Disposition: A | Discharge status: Home / Self Care | Payer: Medicare HMO | Attending: Emergency Medicine | Admitting: Emergency Medicine

## 2012-11-19 ENCOUNTER — Encounter (HOSPITAL_COMMUNITY): Payer: Self-pay | Admitting: Emergency Medicine

## 2012-11-19 DIAGNOSIS — K0889 Other specified disorders of teeth and supporting structures: Secondary | ICD-10-CM

## 2012-11-19 DIAGNOSIS — R51 Headache: Secondary | ICD-10-CM | POA: Insufficient documentation

## 2012-11-19 DIAGNOSIS — K044 Acute apical periodontitis of pulpal origin: Secondary | ICD-10-CM | POA: Insufficient documentation

## 2012-11-19 DIAGNOSIS — Z8739 Personal history of other diseases of the musculoskeletal system and connective tissue: Secondary | ICD-10-CM | POA: Insufficient documentation

## 2012-11-19 DIAGNOSIS — K047 Periapical abscess without sinus: Secondary | ICD-10-CM

## 2012-11-19 DIAGNOSIS — F411 Generalized anxiety disorder: Secondary | ICD-10-CM | POA: Insufficient documentation

## 2012-11-19 DIAGNOSIS — Z87891 Personal history of nicotine dependence: Secondary | ICD-10-CM | POA: Insufficient documentation

## 2012-11-19 DIAGNOSIS — G8929 Other chronic pain: Secondary | ICD-10-CM | POA: Insufficient documentation

## 2012-11-19 DIAGNOSIS — D649 Anemia, unspecified: Secondary | ICD-10-CM | POA: Insufficient documentation

## 2012-11-19 DIAGNOSIS — K089 Disorder of teeth and supporting structures, unspecified: Secondary | ICD-10-CM | POA: Insufficient documentation

## 2012-11-19 DIAGNOSIS — Z8679 Personal history of other diseases of the circulatory system: Secondary | ICD-10-CM | POA: Insufficient documentation

## 2012-11-19 DIAGNOSIS — Z79899 Other long term (current) drug therapy: Secondary | ICD-10-CM | POA: Insufficient documentation

## 2012-11-19 MED ORDER — ONDANSETRON 4 MG PO TBDP
4.0000 mg | ORAL_TABLET | Freq: Once | ORAL | Status: AC
  Administered 2012-11-19: 4 mg via ORAL
  Filled 2012-11-19: qty 1

## 2012-11-19 MED ORDER — OXYCODONE-ACETAMINOPHEN 10-325 MG PO TABS: 1.0000 | ORAL_TABLET | ORAL | Status: DC | PRN

## 2012-11-19 MED ORDER — CHLORHEXIDINE GLUCONATE 0.12 % MT SOLN: 15.0000 mL | Freq: Two times a day (BID) | OROMUCOSAL | Status: DC

## 2012-11-19 MED ORDER — AMOXICILLIN 500 MG PO CAPS: 1000.0000 mg | ORAL_CAPSULE | Freq: Two times a day (BID) | ORAL | Status: DC

## 2012-11-19 MED ORDER — OXYCODONE-ACETAMINOPHEN 5-325 MG PO TABS
2.0000 | ORAL_TABLET | Freq: Once | ORAL | Status: AC
  Administered 2012-11-19: 2 via ORAL
  Filled 2012-11-19: qty 2

## 2012-11-19 NOTE — ED Notes (Signed)
Pt reports dental pain on lower right side due to cracked tooth x1 week. Pain now radiating to jaw and pt feels jaw is swollen and tender to touch. Pt also reports HA and chills at home.

## 2012-11-19 NOTE — ED Notes (Signed)
PA at bedside.

## 2012-11-19 NOTE — Discharge Instructions (Signed)
 You have a dental INFECTION. Use the resource guide listed below to help you find a dentist if you do not already have one to followup with. It is very important that you get evaluated by a dentist as soon as possible. Call tomorrow to schedule an appointment. Use your pain medication as prescribed and do not operate heavy machinery while on pain medication. Note that your pain medication contains acetaminophen (Tylenol) & its is not reccommended that you use additional acetaminophen (Tylenol) while taking this medication. Take your full course of antibiotics. Read the instructions below.  Eat a soft or liquid diet and rinse your mouth out after meals with warm water. You should see a dentist or return here at once if you have increased swelling, increased pain or uncontrolled bleeding from the site of your injury.   SEEK MEDICAL CARE IF:   You have increased pain not controlled with medicines.   You have swelling around your tooth, in your face or neck.   You have bleeding which starts, continues, or gets worse.   You have a fever >101  If you are unable to open your mouth  RESOURCE GUIDE  Dental Problems  Patients with Medicaid: [facility] [phone]                                           [phone] [address] Phone:  [phone]                                                  Phone:  [phone]  If unable to pay or uninsured, contact:  [facility] or [facility]. to become qualified for the adult dental clinic.  Chronic Pain Problems Contact [facility]  [phone] Patients need to be referred by their primary care doctor.  Insufficient Money for Medicine Contact [employer]:  call "211" or [facility] (936) 096-4847.  No Primary Care Doctor Call [facility]  2191182934 Other agencies that provide inexpensive medical care    [facility]  [phone]    [facility]  Internal Medicine  [phone]    [facility]  [phone]    [facility]  [phone]    [facility]  [phone]    [facility]  [phone]  Psychological Services [facility]  [phone] [facility]  [phone] [facility]   [phone] (emergency services [phone])  Substance Abuse Resources [facility]  205-845-4337 [facility] [phone] [facility] [place] [phone] Floydene Flock 8027735753 Residential & Outpatient Substance Abuse Program  ([phone]  Abuse/Neglect [facility] Child Abuse Hotline (612)354-0686 [facility] Child Abuse Hotline (438)226-9480 (After Hours)  Emergency Shelter [facility] [phone]  [facility] ([phone] [facility] [phone]  MRSA Hotline #:   813-553-9471    [facility]  [facility] Dept. [address] [place]                       [address]      [address]  Blondell Reveal Phone:  [phone]                                   Phone:  [phone]                 Phone:  [phone]  [facility] Phone:  [phone]  [facility] Child Abuse Hotline 763-077-3817 306-347-4591 (After Hours)

## 2012-11-19 NOTE — ED Provider Notes (Signed)
CSN: [id number]     Arrival date & time 11/19/12  1114 History     First MD Initiated Contact with Patient 11/19/12 1125     Chief Complaint  Patient presents with  . Dental Pain   (Consider location/radiation/quality/duration/timing/severity/associated sxs/prior Treatment) HPI  47 year old female with a past medical history of crack right lower tooth for the past week and poor dentition presents with chief complaint of severe dental pain and swelling of the right lower jaw.  Patient states that she's had increasing pain in the cracked tooth over the past week.  She has had difficulty eating or drinking.  She pain is worse with movement of the jaw, eating, cold or hot.  She has associated headaches but denies photophobia, phonophobia, UL throbbing, N/V, visual changes, stiff neck, neck pain, rash, or "thunderclap" onset.  Patient denies difficulty breathing or swallowing.    Past Medical History  Diagnosis Date  . Chronic pain   . Bulging lumbar disc   . Abnormal Pap smear   . Anxiety     Takes xanax  . Arthritis     Has rx for percocet  . Migraines   . Anemia    Past Surgical History  Procedure Laterality Date  . Cesarean section      X 3  . Cervical cone biopsy    . Gastric bypass  2007  . Cesarean section with bilateral tubal ligation Bilateral 10/28/2012    Procedure: Repeat cesarean section with delivery of baby boy at 1210. Apgars 8/9.  BILATERAL TUBAL LIGATION;  Surgeon: Lazaro Arms, MD;  Location: [facility];  Service: Obstetrics;  Laterality: Bilateral;   Family History  Problem Relation Age of Onset  . Diabetes Father   . Depression Maternal Grandmother   . Heart disease Maternal Grandfather   . Depression Paternal Grandmother    History  Substance Use Topics  . Smoking status: Former Smoker -- 0.50 packs/day for 10 years    Types: Cigarettes    Quit date: 03/16/2012  . Smokeless tobacco: Never Used  . Alcohol Use: No     Comment: Had "issues" then socially,  now none   OB History   Grav Para Term Preterm Abortions TAB SAB Ect Mult Living   6 4 4  0 2  2   4      Review of Systems  Constitutional: Negative for fever and chills.  HENT: Positive for dental problem. Negative for drooling, mouth sores, trouble swallowing and voice change.   Respiratory: Negative for shortness of breath.   Cardiovascular: Negative for chest pain.  Gastrointestinal: Negative for nausea, vomiting, abdominal pain, diarrhea and constipation.  Genitourinary: Negative for dysuria and hematuria.  Musculoskeletal: Negative for myalgias and arthralgias.  Skin: Negative for rash.  Neurological: Positive for headaches. Negative for numbness.  All other systems reviewed and are negative.    Allergies  Review of patient's allergies indicates no known allergies.  Home Medications   Current Outpatient Rx  Name  Route  Sig  Dispense  Refill  . BIOTIN PO   Oral   Take 1 tablet by mouth daily.         . cyclobenzaprine (FLEXERIL) 10 MG tablet   Oral   Take 10 mg by mouth 3 (three) times daily as needed for muscle spasms.         . ferrous sulfate 325 (65 FE) MG tablet   Oral   Take 325 mg by mouth 3 (three) times daily. Monday, Wednesday, Friday         .  ibuprofen (ADVIL,MOTRIN) 800 MG tablet   Oral   Take 800 mg by mouth every 8 (eight) hours as needed for pain.         Marland Kitchen oxyCODONE-acetaminophen (PERCOCET/ROXICET) 5-325 MG per tablet   Oral   Take 1 tablet by mouth every 4 (four) hours as needed for pain.         . Prenatal Vit-Fe Fumarate-FA (PRENATAL MULTIVITAMIN) TABS   Oral   Take 1 tablet by mouth daily at 12 noon.          BP 137/85  Pulse 76  Temp(Src) 98.1 F (36.7 C) (Oral)  Resp 16  SpO2 97% Physical Exam  Constitutional: She is oriented to person, place, and time. She appears well-developed and well-nourished. No distress.  HENT:  Head: Normocephalic and atraumatic.  Mouth/Throat:    Poor dentition throughout.  Right first  molar with gum recession, series.  She is tender to palpation with gingival erythema no areas of fluctuance there is swelling in the jaw externally.  Eyes: Conjunctivae are normal. No scleral icterus.  Neck: Normal range of motion.  Cardiovascular: Normal rate, regular rhythm and normal heart sounds.  Exam reveals no gallop and no friction rub.   No murmur heard. Pulmonary/Chest: Effort normal and breath sounds normal. No respiratory distress.  Abdominal: Soft. Bowel sounds are normal. She exhibits no distension and no mass. There is no tenderness. There is no guarding.  Neurological: She is alert and oriented to person, place, and time.  Skin: Skin is warm and dry. She is not diaphoretic.    ED Course   Procedures (including critical care time) Dental nerve block  Performed by: Arthor Captain Authorized by: Arthor Captain Consent: Verbal consent obtained. Patient understanding: patient states understanding of the procedure being performed Patient identity confirmed: verbally with patient Local anesthesia used: yes Local anesthetic: bupivacaine 0.5% with epinephrine Anesthetic total: 0.2 ml Patient sedated: no Patient tolerance: Patient tolerated the procedure well with no immediate complications.    Labs Reviewed - No data to display No results found. 1. Pain, dental   2. Dental infection     MDM  BP 137/85  Pulse 76  Temp(Src) 98.1 F (36.7 C) (Oral)  Resp 16  SpO2 97% Patient with toothache.  No gross abscess.  Exam unconcerning for Ludwig's angina or spread of infection.  Will treat with penicillin and pain medicine.  Urged patient to follow-up with dentist.     Arthor Captain, PA-C 11/19/12 [phone]

## 2012-11-21 NOTE — ED Provider Notes (Signed)
Medical screening examination/treatment/procedure(s) were performed by non-physician practitioner and as supervising physician I was immediately available for consultation/collaboration.   Carleene Cooper III, MD 11/21/12 [phone]

## 2012-11-27 ENCOUNTER — Emergency Department (HOSPITAL_COMMUNITY)
Admission: EM | Admit: 2012-11-27 | Discharge: 2012-11-27 | Disposition: A | Discharge status: Home / Self Care | Payer: Medicare HMO | Attending: Emergency Medicine | Admitting: Emergency Medicine

## 2012-11-27 DIAGNOSIS — G8929 Other chronic pain: Secondary | ICD-10-CM | POA: Insufficient documentation

## 2012-11-27 DIAGNOSIS — Z87891 Personal history of nicotine dependence: Secondary | ICD-10-CM | POA: Insufficient documentation

## 2012-11-27 DIAGNOSIS — G43909 Migraine, unspecified, not intractable, without status migrainosus: Secondary | ICD-10-CM | POA: Insufficient documentation

## 2012-11-27 DIAGNOSIS — F411 Generalized anxiety disorder: Secondary | ICD-10-CM | POA: Insufficient documentation

## 2012-11-27 DIAGNOSIS — K089 Disorder of teeth and supporting structures, unspecified: Secondary | ICD-10-CM | POA: Insufficient documentation

## 2012-11-27 DIAGNOSIS — K0889 Other specified disorders of teeth and supporting structures: Secondary | ICD-10-CM

## 2012-11-27 DIAGNOSIS — Z79899 Other long term (current) drug therapy: Secondary | ICD-10-CM | POA: Insufficient documentation

## 2012-11-27 DIAGNOSIS — Z8739 Personal history of other diseases of the musculoskeletal system and connective tissue: Secondary | ICD-10-CM | POA: Insufficient documentation

## 2012-11-27 DIAGNOSIS — D649 Anemia, unspecified: Secondary | ICD-10-CM | POA: Insufficient documentation

## 2012-11-27 MED ORDER — BUPIVACAINE HCL 0.5 % IJ SOLN
50.0000 mL | Freq: Once | INTRAMUSCULAR | Status: AC
  Administered 2012-11-27: 50 mL

## 2012-11-27 MED ORDER — OXYCODONE-ACETAMINOPHEN 5-325 MG PO TABS
2.0000 | ORAL_TABLET | Freq: Once | ORAL | Status: AC
  Administered 2012-11-27: 2 via ORAL
  Filled 2012-11-27: qty 2

## 2012-11-27 MED ORDER — OXYCODONE-ACETAMINOPHEN 5-325 MG PO TABS: 1.0000 | ORAL_TABLET | Freq: Four times a day (QID) | ORAL | Status: DC | PRN

## 2012-11-27 NOTE — ED Provider Notes (Signed)
CSN: [id number]     Arrival date & time 11/27/12  8295 History     First MD Initiated Contact with Patient 11/27/12 [phone]     Chief Complaint  Patient presents with  . Dental Pain   (Consider location/radiation/quality/duration/timing/severity/associated sxs/prior Treatment) HPI Comments: 47 year old female presents to the emergency department complaining of continuing right lower dental pain after being seen in the emergency department on July 27. Patient states the pain went away when she had the dental block, however returned once this wore off. She's been using the Percocet every 4 hours which is moderately helping her pain. She was also placed on amoxicillin at that time for dental infection which she is still currently taking. She was advised to followup with the dentist, however the dentist was too expensive her insurance so she is currently in the process of getting Medicaid in order to followup. Pain described as sharp and severe, 10 out of 10, radiating to her lower right jaw, worse with chewing or eating. Denies fever, chills, difficulty swallowing.  Patient is a 47 y.o. female presenting with tooth pain. The history is provided by the patient.  Dental Pain   Past Medical History  Diagnosis Date  . Chronic pain   . Bulging lumbar disc   . Abnormal Pap smear   . Anxiety     Takes xanax  . Arthritis     Has rx for percocet  . Migraines   . Anemia    Past Surgical History  Procedure Laterality Date  . Cesarean section      X 3  . Cervical cone biopsy    . Gastric bypass  2007  . Cesarean section with bilateral tubal ligation Bilateral 10/28/2012    Procedure: Repeat cesarean section with delivery of baby boy at 1210. Apgars 8/9.  BILATERAL TUBAL LIGATION;  Surgeon: Lazaro Arms, MD;  Location: [facility];  Service: Obstetrics;  Laterality: Bilateral;   Family History  Problem Relation Age of Onset  . Diabetes Father   . Depression Maternal Grandmother   . Heart disease  Maternal Grandfather   . Depression Paternal Grandmother    History  Substance Use Topics  . Smoking status: Former Smoker -- 0.50 packs/day for 10 years    Types: Cigarettes    Quit date: 03/16/2012  . Smokeless tobacco: Never Used  . Alcohol Use: No     Comment: Had "issues" then socially, now none   OB History   Grav Para Term Preterm Abortions TAB SAB Ect Mult Living   6 4 4  0 2  2   4      Review of Systems  Constitutional: Negative for chills and fatigue.  HENT: Positive for dental problem. Negative for trouble swallowing.   All other systems reviewed and are negative.    Allergies  Review of patient's allergies indicates no known allergies.  Home Medications   Current Outpatient Rx  Name  Route  Sig  Dispense  Refill  . amoxicillin (AMOXIL) 500 MG capsule   Oral   Take 2 capsules (1,000 mg total) by mouth 2 (two) times daily.   40 capsule   0   . BIOTIN PO   Oral   Take 1 tablet by mouth daily.         . chlorhexidine (PERIDEX) 0.12 % solution   Mouth/Throat   Use as directed 15 mLs in the mouth or throat 2 (two) times daily.   120 mL   0   .  cyclobenzaprine (FLEXERIL) 10 MG tablet   Oral   Take 10 mg by mouth 3 (three) times daily as needed for muscle spasms.         . ferrous sulfate 325 (65 FE) MG tablet   Oral   Take 325 mg by mouth 3 (three) times daily. Monday, Wednesday, Friday         . ibuprofen (ADVIL,MOTRIN) 800 MG tablet   Oral   Take 800 mg by mouth every 8 (eight) hours as needed for pain.         Marland Kitchen oxyCODONE-acetaminophen (PERCOCET) 10-325 MG per tablet   Oral   Take 1 tablet by mouth every 4 (four) hours as needed for pain.   30 tablet   0   . oxyCODONE-acetaminophen (PERCOCET/ROXICET) 5-325 MG per tablet   Oral   Take 1 tablet by mouth every 4 (four) hours as needed for pain.         . Prenatal Vit-Fe Fumarate-FA (PRENATAL MULTIVITAMIN) TABS   Oral   Take 1 tablet by mouth daily at 12 noon.          BP 140/87   Pulse 90  Temp(Src) 97.9 F (36.6 C) (Oral)  SpO2 100% Physical Exam  Nursing note and vitals reviewed. Constitutional: She is oriented to person, place, and time. She appears well-developed and well-nourished. No distress.  HENT:  Head: Normocephalic and atraumatic.  Mouth/Throat: Uvula is midline and oropharynx is clear and moist. Abnormal dentition.    No facial swelling. Poor dentition. Gingiva without erythema, edema or abscess. TTP of right posterior 1st, 2nd and 3rd molars, 1st molar cracked through dentin.  Eyes: Conjunctivae are normal.  Neck: Normal range of motion. Neck supple.  Cardiovascular: Normal rate, regular rhythm and normal heart sounds.   Pulmonary/Chest: Effort normal and breath sounds normal.  Musculoskeletal: Normal range of motion. She exhibits no edema.  Neurological: She is alert and oriented to person, place, and time.  Skin: Skin is warm and dry. She is not diaphoretic.  Psychiatric: She has a normal mood and affect. Her behavior is normal.    ED Course   Dental Date/Time: 11/27/2012 9:56 AM Performed by: Trevor Mace Authorized by: Trevor Mace Consent: Verbal consent obtained. Consent given by: patient Local anesthesia used: yes Anesthesia: local infiltration Local anesthetic: bupivacaine 0.5% with epinephrine Anesthetic total: 0.2 ml Patient sedated: no Patient tolerance: Patient tolerated the procedure well with no immediate complications.   (including critical care time)   Labs Reviewed - No data to display No results found. 1. Pain, dental     MDM  Dental pain without associated dental infection. She is still taking amoxicillin which I strongly advised her to continue the entire course. She was unable to make an appointment with the dentist, return for a dental block. Dental block given. She is aware that she needs to followup with the dentist.  Trevor Mace, PA-C 11/27/12 [phone]

## 2012-11-27 NOTE — Discharge Instructions (Signed)
 Your given a dental block today for your pain. Use Percocet for severe pain only. No driving or operating heavy machinery while taking this drug as it may cause drowsiness. Followup with the dentist as soon as possible.  Dental Pain A tooth ache may be caused by cavities (tooth decay). Cavities expose the nerve of the tooth to air and hot or cold temperatures. It may come from an infection or abscess (also called a boil or furuncle) around your tooth. It is also often caused by dental caries (tooth decay). This causes the pain you are having. DIAGNOSIS  Your caregiver can diagnose this problem by exam. TREATMENT   If caused by an infection, it may be treated with medications which kill germs (antibiotics) and pain medications as prescribed by your caregiver. Take medications as directed.  Only take over-the-counter or prescription medicines for pain, discomfort, or fever as directed by your caregiver.  Whether the tooth ache today is caused by infection or dental disease, you should see your dentist as soon as possible for further care. SEEK MEDICAL CARE IF: The exam and treatment you received today has been provided on an emergency basis only. This is not a substitute for complete medical or dental care. If your problem worsens or new problems (symptoms) appear, and you are unable to meet with your dentist, call or return to this location. SEEK IMMEDIATE MEDICAL CARE IF:   You have a fever.  You develop redness and swelling of your face, jaw, or neck.  You are unable to open your mouth.  You have severe pain uncontrolled by pain medicine. MAKE SURE YOU:   Understand these instructions.  Will watch your condition.  Will get help right away if you are not doing well or get worse. Document Released: 04/12/2005 Document Revised: 07/05/2011 Document Reviewed: 11/29/2007 [facility] Patient Information 2014 [place], [place].

## 2012-11-27 NOTE — ED Notes (Addendum)
Pt reports pain in rt lower jaw.  Pt has been seen for same in the past.  Pt has broken a decaying teeth on right lower jaw.  No signs of infection.  Pt is taking amoxcillin as rx and pain meds every 4 hours as rx.  Pt wants help for pain but cannot see a dentist.  Pt requesting dental block That has worked for her in the past.  Pt alert oriented X4

## 2012-11-30 NOTE — ED Provider Notes (Signed)
Medical screening examination/treatment/procedure(s) were performed by non-physician practitioner and as supervising physician I was immediately available for consultation/collaboration.  Candyce Churn, MD 11/30/12 [phone]

## 2012-12-04 ENCOUNTER — Encounter: Payer: Self-pay | Admitting: *Deleted

## 2012-12-04 ENCOUNTER — Ambulatory Visit: Payer: Medicare HMO | Admitting: Obstetrics and Gynecology

## 2012-12-20 ENCOUNTER — Emergency Department (HOSPITAL_COMMUNITY): Payer: Medicare HMO

## 2012-12-20 ENCOUNTER — Emergency Department (HOSPITAL_COMMUNITY)
Admission: EM | Admit: 2012-12-20 | Discharge: 2012-12-20 | Disposition: A | Discharge status: Home / Self Care | Payer: Medicare HMO | Attending: Emergency Medicine | Admitting: Emergency Medicine

## 2012-12-20 ENCOUNTER — Encounter (HOSPITAL_COMMUNITY): Payer: Self-pay | Admitting: *Deleted

## 2012-12-20 DIAGNOSIS — W19XXXA Unspecified fall, initial encounter: Secondary | ICD-10-CM | POA: Insufficient documentation

## 2012-12-20 DIAGNOSIS — D649 Anemia, unspecified: Secondary | ICD-10-CM | POA: Insufficient documentation

## 2012-12-20 DIAGNOSIS — Z87891 Personal history of nicotine dependence: Secondary | ICD-10-CM | POA: Insufficient documentation

## 2012-12-20 DIAGNOSIS — M5126 Other intervertebral disc displacement, lumbar region: Secondary | ICD-10-CM | POA: Insufficient documentation

## 2012-12-20 DIAGNOSIS — Y92009 Unspecified place in unspecified non-institutional (private) residence as the place of occurrence of the external cause: Secondary | ICD-10-CM | POA: Insufficient documentation

## 2012-12-20 DIAGNOSIS — G8929 Other chronic pain: Secondary | ICD-10-CM | POA: Insufficient documentation

## 2012-12-20 DIAGNOSIS — Y9389 Activity, other specified: Secondary | ICD-10-CM | POA: Insufficient documentation

## 2012-12-20 DIAGNOSIS — M25551 Pain in right hip: Secondary | ICD-10-CM

## 2012-12-20 DIAGNOSIS — F411 Generalized anxiety disorder: Secondary | ICD-10-CM | POA: Insufficient documentation

## 2012-12-20 MED ORDER — OXYCODONE-ACETAMINOPHEN 5-325 MG PO TABS: 1.0000 | ORAL_TABLET | Freq: Four times a day (QID) | ORAL | Status: DC | PRN

## 2012-12-20 MED ORDER — OXYCODONE-ACETAMINOPHEN 5-325 MG PO TABS
1.0000 | ORAL_TABLET | Freq: Once | ORAL | Status: AC
  Administered 2012-12-20: 1 via ORAL
  Filled 2012-12-20: qty 1

## 2012-12-20 NOTE — ED Notes (Signed)
Pt in s/p fall last night, states she has a history of pain to lower back and pain to left hip and that pain to those areas has increased since her fall

## 2012-12-20 NOTE — ED Provider Notes (Signed)
CSN: [id number]     Arrival date & time 12/20/12  1233 History   First MD Initiated Contact with Patient 12/20/12 1304     Chief Complaint  Patient presents with  . Fall   (Consider location/radiation/quality/duration/timing/severity/associated sxs/prior Treatment) HPI  Angie [name] is a 47 y.o.female without any significant PMH presents to the ER with complaints of left hip pain after a fall in the kitchen last night. 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 She denies being unable to walk but is having severe pain and no longer has pain medication from her surgery. She denies head injury, neck pain or chest injury.    Past Medical History  Diagnosis Date  . Chronic pain   . Bulging lumbar disc   . Abnormal Pap smear   . Anxiety     Takes xanax  . Arthritis     Has rx for percocet  . Migraines   . Anemia    Past Surgical History  Procedure Laterality Date  . Cesarean section      X 3  . Cervical cone biopsy    . Gastric bypass  2007  . Cesarean section with bilateral tubal ligation Bilateral 10/28/2012    Procedure: Repeat cesarean section with delivery of baby boy at 1210. Apgars 8/9.  BILATERAL TUBAL LIGATION;  Surgeon: Lazaro Arms, MD;  Location: [facility];  Service: Obstetrics;  Laterality: Bilateral;   Family History  Problem Relation Age of Onset  . Diabetes Father   . Depression Maternal Grandmother   . Heart disease Maternal Grandfather   . Depression Paternal Grandmother    History  Substance Use Topics  . Smoking status: Former Smoker -- 0.50 packs/day for 10 years    Types: Cigarettes    Quit date: 03/16/2012  . Smokeless tobacco: Never Used  . Alcohol Use: No     Comment: Had "issues" then socially, now none   OB History   Grav Para Term Preterm Abortions TAB SAB Ect Mult Living   6 4 4  0 2  2   4      Review of Systems ROS is negative unless otherwise stated in the HPI   Allergies  Review of patient's allergies  indicates no known allergies.  Home Medications   Current Outpatient Rx  Name  Route  Sig  Dispense  Refill  . acetaminophen (TYLENOL) 500 MG tablet   Oral   Take 500 mg by mouth once.         . ferrous sulfate 325 (65 FE) MG tablet   Oral   Take 325 mg by mouth 3 (three) times daily.          Marland Kitchen OVER THE COUNTER MEDICATION   Oral   Take 1 tablet by mouth daily. Biotin         . oxyCODONE-acetaminophen (PERCOCET) 5-325 MG per tablet   Oral   Take 1-2 tablets by mouth every 6 (six) hours as needed for pain.   10 tablet   0    BP 113/80  Pulse 76  Temp(Src) 98 F (36.7 C) (Oral)  Resp 20  Wt 174 lb (78.926 kg)  BMI 33.98 kg/m2  SpO2 100% Physical Exam  Nursing note and vitals reviewed. Constitutional: She appears well-developed and well-nourished. No distress.  HENT:  Head: Normocephalic and atraumatic.  Eyes: Pupils are equal, round, and reactive to light.  Neck: Normal range of motion. Neck supple.  Cardiovascular: Normal  rate and regular rhythm.   Pulmonary/Chest: Effort normal.  Abdominal: Soft.  Musculoskeletal:       Left hip: She exhibits tenderness and bony tenderness. She exhibits normal range of motion, normal strength, no swelling, no crepitus, no deformity and no laceration.       Legs: Neurological: She is alert.  Skin: Skin is warm and dry.    ED Course  Procedures (including critical care time) Labs Review Labs Reviewed - No data to display Imaging Review Dg Hip Complete Left  12/20/2012   *RADIOLOGY REPORT*  Clinical Data: Status post fall.  Left hip pain.  LEFT HIP - COMPLETE 2+ VIEW  Comparison: None.  Findings: Imaged bones, joints and soft tissues appear normal.  IMPRESSION: Negative exam.   Original Report Authenticated By: Holley Dexter, M.D.    MDM   1. Fall at home, initial encounter   2. Hip pain, right    Patient with back pain. No neurological deficits. Patient is ambulatory. No warning symptoms of back pain including:  loss of bowel or bladder control, night sweats, waking from sleep with back pain, unexplained fevers or weight loss, h/o cancer, IVDU, recent significant  trauma. No concern for cauda equina, epidural abscess, or other serious cause of back pain. Conservative measures such as rest, ice/heat and pain medicine indicated with PCP follow-up if no improvement with conservative management.   47 y.o.Angie [name]'s evaluation in the Emergency Department is complete. It has been determined that no acute conditions requiring further emergency intervention are present at this time. The patient/guardian have been advised of the diagnosis and plan. We have discussed signs and symptoms that warrant return to the ED, such as changes or worsening in symptoms.  Vital signs are stable at discharge. Filed Vitals:   12/20/12 1237  BP: 113/80  Pulse: 76  Temp: 98 F (36.7 C)  Resp: 20    Patient/guardian has voiced understanding and agreed to follow-up with the PCP or specialist.     Dorthula Matas, PA-C 12/20/12 1343

## 2012-12-20 NOTE — Discharge Instructions (Signed)
Hip Pain  The hips join the upper legs to the lower pelvis. The bones, cartilage, tendons, and muscles of the hip joint perform a lot of work each day holding your body weight and allowing you to move around.  Hip pain is a common symptom. It can range from a minor ache to severe pain on 1 or both hips. Pain may be felt on the inside of the hip joint near the groin, or the outside near the buttocks and upper thigh. There may be swelling or stiffness as well. It occurs more often when a person walks or performs activity. There are many reasons hip pain can develop.  CAUSES   It is important to work with your caregiver to identify the cause since many conditions can impact the bones, cartilage, muscles, and tendons of the hips. Causes for hip pain include:  · Broken (fractured) bones.  · Separation of the thighbone from the hip socket (dislocation).  · Torn cartilage of the hip joint.  · Swelling (inflammation) of a tendon (tendonitis), the sac within the hip joint (bursitis), or a joint.  · A weakening in the abdominal wall (hernia), affecting the nerves to the hip.  · Arthritis in the hip joint or lining of the hip joint.  · Pinched nerves in the back, hip, or upper thigh.  · A bulging disc in the spine (herniated disc).  · Rarely, bone infection or cancer.  DIAGNOSIS   The location of your hip pain will help your caregiver understand what may be causing the pain. A diagnosis is based on your medical history, your symptoms, results from your physical exam, and results from diagnostic tests. Diagnostic tests may include X-ray exams, a computerized magnetic scan (magnetic resonance imaging, MRI), or bone scan.  TREATMENT   Treatment will depend on the cause of your hip pain. Treatment may include:  · Limiting activities and resting until symptoms improve.  · Crutches or other walking supports (a cane or brace).  · Ice, elevation, and compression.  · Physical therapy or home exercises.  · Shoe inserts or special  shoes.  · Losing weight.  · Medications to reduce pain.  · Undergoing surgery.  HOME CARE INSTRUCTIONS   · Only take over-the-counter or prescription medicines for pain, discomfort, or fever as directed by your caregiver.  · Put ice on the injured area:  · Put ice in a plastic bag.  · Place a towel between your skin and the bag.  · Leave the ice on for 15-20 minutes at a time, 3-4 times a day.  · Keep your leg raised (elevated) when possible to lessen swelling.  · Avoid activities that cause pain.  · Follow specific exercises as directed by your caregiver.  · Sleep with a pillow between your legs on your most comfortable side.  · Record how often you have hip pain, the location of the pain, and what it feels like. This information may be helpful to you and your caregiver.  · Ask your caregiver about returning to work or sports and whether you should drive.  · Follow up with your caregiver for further exams, therapy, or testing as directed.  SEEK MEDICAL CARE IF:   · Your pain or swelling continues or worsens after 1 week.  · You are feeling unwell or have chills.  · You have increasing difficulty with walking.  · You have a loss of sensation or other new symptoms.  · You have questions or concerns.  SEEK   IMMEDIATE MEDICAL CARE IF:   · You cannot put weight on the affected hip.  · You have fallen.  · You have a sudden increase in pain and swelling in your hip.  · You have a fever.  MAKE SURE YOU:   · Understand these instructions.  · Will watch your condition.  · Will get help right away if you are not doing well or get worse.  Document Released: 09/30/2009 Document Revised: 07/05/2011 Document Reviewed: 09/30/2009  [employer]® Patient Information ©2014 [employer].

## 2012-12-20 NOTE — ED Provider Notes (Signed)
Medical screening examination/treatment/procedure(s) were performed by non-physician practitioner and as supervising physician I was immediately available for consultation/collaboration.   Junius Argyle, MD 12/20/12 [phone]

## 2013-01-05 ENCOUNTER — Emergency Department (HOSPITAL_COMMUNITY)
Admission: EM | Admit: 2013-01-05 | Discharge: 2013-01-05 | Disposition: A | Discharge status: Home / Self Care | Payer: Medicare HMO | Attending: Emergency Medicine | Admitting: Emergency Medicine

## 2013-01-05 ENCOUNTER — Encounter (HOSPITAL_COMMUNITY): Payer: Self-pay | Admitting: Emergency Medicine

## 2013-01-05 DIAGNOSIS — Z8679 Personal history of other diseases of the circulatory system: Secondary | ICD-10-CM | POA: Insufficient documentation

## 2013-01-05 DIAGNOSIS — Z79899 Other long term (current) drug therapy: Secondary | ICD-10-CM | POA: Insufficient documentation

## 2013-01-05 DIAGNOSIS — G8929 Other chronic pain: Secondary | ICD-10-CM | POA: Insufficient documentation

## 2013-01-05 DIAGNOSIS — M129 Arthropathy, unspecified: Secondary | ICD-10-CM | POA: Insufficient documentation

## 2013-01-05 DIAGNOSIS — Z8659 Personal history of other mental and behavioral disorders: Secondary | ICD-10-CM | POA: Insufficient documentation

## 2013-01-05 DIAGNOSIS — D649 Anemia, unspecified: Secondary | ICD-10-CM | POA: Insufficient documentation

## 2013-01-05 DIAGNOSIS — Z87891 Personal history of nicotine dependence: Secondary | ICD-10-CM | POA: Insufficient documentation

## 2013-01-05 DIAGNOSIS — Z9889 Other specified postprocedural states: Secondary | ICD-10-CM | POA: Insufficient documentation

## 2013-01-05 DIAGNOSIS — K044 Acute apical periodontitis of pulpal origin: Secondary | ICD-10-CM | POA: Insufficient documentation

## 2013-01-05 DIAGNOSIS — R509 Fever, unspecified: Secondary | ICD-10-CM | POA: Insufficient documentation

## 2013-01-05 DIAGNOSIS — K047 Periapical abscess without sinus: Secondary | ICD-10-CM

## 2013-01-05 MED ORDER — CLINDAMYCIN HCL 300 MG PO CAPS: 300.0000 mg | ORAL_CAPSULE | Freq: Four times a day (QID) | ORAL | Status: DC

## 2013-01-05 MED ORDER — HYDROCODONE-ACETAMINOPHEN 5-325 MG PO TABS: 1.0000 | ORAL_TABLET | Freq: Four times a day (QID) | ORAL | Status: DC | PRN

## 2013-01-05 MED ORDER — HYDROCODONE-ACETAMINOPHEN 5-325 MG PO TABS
2.0000 | ORAL_TABLET | Freq: Once | ORAL | Status: AC
  Administered 2013-01-05: 2 via ORAL
  Filled 2013-01-05: qty 2

## 2013-01-05 NOTE — ED Provider Notes (Signed)
CSN: [id number]     Arrival date & time 01/05/13  1202 History   First MD Initiated Contact with Patient 01/05/13 1217     Chief Complaint  Patient presents with  . Dental Pain   (Consider location/radiation/quality/duration/timing/severity/associated sxs/prior Treatment) HPI Comments: 47 year old female presents to the emergency department complaining of left-sided upper and lower dental pain returning since she had oral surgery 8 days ago. Patient had 5 teeth extracted by Dr. Laural Benes, 2 on the right and 3 on the left. 2 of her upper teeth on the left were extracted and the one on the lower left. States yesterday her pain began to worsen, feels as if she has food stuck inside of the holes or that her stitches may have fallen out. She is on amoxicillin. States she feels feverish, however has not taken her temperature. She tried calling Dr. Barbette Merino, he was unable to see her today but will see her on Monday. Advised her to come to the emergency department.  Patient is a 47 y.o. female presenting with tooth pain. The history is provided by the patient.  Dental Pain Associated symptoms: fever     Past Medical History  Diagnosis Date  . Chronic pain   . Bulging lumbar disc   . Abnormal Pap smear   . Anxiety     Takes xanax  . Arthritis     Has rx for percocet  . Migraines   . Anemia    Past Surgical History  Procedure Laterality Date  . Cesarean section      X 3  . Cervical cone biopsy    . Gastric bypass  2007  . Cesarean section with bilateral tubal ligation Bilateral 10/28/2012    Procedure: Repeat cesarean section with delivery of baby boy at 1210. Apgars 8/9.  BILATERAL TUBAL LIGATION;  Surgeon: Lazaro Arms, MD;  Location: [facility];  Service: Obstetrics;  Laterality: Bilateral;   Family History  Problem Relation Age of Onset  . Diabetes Father   . Depression Maternal Grandmother   . Heart disease Maternal Grandfather   . Depression Paternal Grandmother    History  Substance  Use Topics  . Smoking status: Former Smoker -- 0.50 packs/day for 10 years    Types: Cigarettes    Quit date: 03/16/2012  . Smokeless tobacco: Never Used  . Alcohol Use: No     Comment: Had "issues" then socially, now none   OB History   Grav Para Term Preterm Abortions TAB SAB Ect Mult Living   6 4 4  0 2  2   4      Review of Systems  Constitutional: Positive for fever. Negative for chills.  HENT: Positive for dental problem.   All other systems reviewed and are negative.    Allergies  Review of patient's allergies indicates no known allergies.  Home Medications   Current Outpatient Rx  Name  Route  Sig  Dispense  Refill  . acetaminophen (TYLENOL) 500 MG tablet   Oral   Take 500 mg by mouth once.         . clindamycin (CLEOCIN) 300 MG capsule   Oral   Take 1 capsule (300 mg total) by mouth 4 (four) times daily. X 7 days   28 capsule   0   . ferrous sulfate 325 (65 FE) MG tablet   Oral   Take 325 mg by mouth 3 (three) times daily.          Marland Kitchen HYDROcodone-acetaminophen (NORCO/VICODIN) 5-325  MG per tablet   Oral   Take 1-2 tablets by mouth every 6 (six) hours as needed for pain.   10 tablet   0   . OVER THE COUNTER MEDICATION   Oral   Take 1 tablet by mouth daily. Biotin         . oxyCODONE-acetaminophen (PERCOCET) 5-325 MG per tablet   Oral   Take 1-2 tablets by mouth every 6 (six) hours as needed for pain.   10 tablet   0   . oxyCODONE-acetaminophen (PERCOCET/ROXICET) 5-325 MG per tablet   Oral   Take 1 tablet by mouth every 6 (six) hours as needed for pain.   20 tablet   0    BP 130/98  Pulse 94  Temp(Src) 98.3 F (36.8 C) (Oral)  Resp 18  Ht 5' (1.524 m)  Wt 174 lb (78.926 kg)  BMI 33.98 kg/m2  SpO2 100% Physical Exam  Constitutional: She is oriented to person, place, and time. She appears well-developed and well-nourished. No distress.  HENT:  Head: Normocephalic and atraumatic.  Mouth/Throat: Uvula is midline, oropharynx is clear  and moist and mucous membranes are normal.  No facial swelling. Surgically removed lower left first molar, upper first and second molar. Surrounding erythema and edema of the lower extracted tooth, no abscess. No visible food in socket. No sutures visible upper or lower. No bleeding.  Eyes: Conjunctivae are normal.  Neck: Normal range of motion. Neck supple.  Cardiovascular: Normal rate, regular rhythm and normal heart sounds.   Pulmonary/Chest: Effort normal and breath sounds normal.  Musculoskeletal: Normal range of motion. She exhibits no edema.  Neurological: She is alert and oriented to person, place, and time.  Skin: Skin is warm and dry. She is not diaphoretic.  Psychiatric: She has a normal mood and affect. Her behavior is normal.    ED Course  Procedures (including critical care time) Labs Review Labs Reviewed - No data to display Imaging Review No results found.  MDM   1. Dental infection    Patient with dental infection after tooth extraction. She is on amoxicillin. No abscess. She will stop taking amoxicillin, begin clindamycin. I advised warm salt water gargles. No bleeding, visible food in socket. Vicodin for severe pain. She will followup with Dr. Barbette Merino on Monday. Return precautions discussed. Patient states understanding of plan and is agreeable.  Trevor Mace, PA-C 01/05/13 1233

## 2013-01-05 NOTE — ED Notes (Signed)
Pt reports having oral surgery on Thursday of last week, and on the L side is now having severe pain and swelling where she had 2 teeth pulled on L upper and one on L lower; thinks she has food stuck area of teeth being pulled and thinks her sutures came out

## 2013-01-05 NOTE — Discharge Instructions (Signed)
 Stop taking amoxicillin and begin taking clindamycin. Take Vicodin for severe pain only. No driving or operating heavy machinery while taking vicodin. This medication may cause drowsiness. Followup with Dr. Barbette Merino on Monday as discussed. Continue rinsing with warm salt water.  Facial Infection You have an infection of your face. This requires special attention to help prevent serious problems. Infections in facial wounds can cause poor healing and scars. They can also spread to deeper tissues, especially around the eye. Wound and dental infections can lead to sinusitis, infection of the eye socket, and even meningitis. Permanent damage to the skin, eye, and nervous system may result if facial infections are not treated properly. With severe infections, hospital care for IV antibiotic injections may be needed if they don't respond to oral antibiotics. Antibiotics must be taken for the full course to insure the infection is eliminated. If the infection came from a bad tooth, it may have to be extracted when the infection is under control. Warm compresses may be applied to reduce skin irritation and remove drainage. You might need a tetanus shot now if:  You cannot remember when your last tetanus shot was.  You have never had a tetanus shot.  The object that caused your wound was dirty. If you need a tetanus shot, and you decide not to get one, there is a rare chance of getting tetanus. Sickness from tetanus can be serious. If you got a tetanus shot, your arm may swell, get red and warm to the touch at the shot site. This is common and not a problem. SEEK IMMEDIATE MEDICAL CARE IF:   You have increased swelling, redness, or trouble breathing.  You have a severe headache, dizziness, nausea, or vomiting.  You develop problems with your eyesight.  You have a fever. Document Released: 05/20/2004 Document Revised: 07/05/2011 Document Reviewed: 04/12/2005 [facility] Patient Information 2014 [place],  [place].

## 2013-01-06 NOTE — ED Provider Notes (Signed)
Medical screening examination/treatment/procedure(s) were performed by non-physician practitioner and as supervising physician I was immediately available for consultation/collaboration.  Ellissa Ayo L Patsy Zaragoza, MD 01/06/13 1002 

## 2013-01-21 ENCOUNTER — Emergency Department (HOSPITAL_COMMUNITY)
Admission: EM | Admit: 2013-01-21 | Discharge: 2013-01-21 | Disposition: A | Discharge status: Home / Self Care | Payer: Medicare HMO | Attending: Emergency Medicine | Admitting: Emergency Medicine

## 2013-01-21 ENCOUNTER — Encounter (HOSPITAL_COMMUNITY): Payer: Self-pay | Admitting: Nurse Practitioner

## 2013-01-21 DIAGNOSIS — Z8659 Personal history of other mental and behavioral disorders: Secondary | ICD-10-CM | POA: Insufficient documentation

## 2013-01-21 DIAGNOSIS — Z8679 Personal history of other diseases of the circulatory system: Secondary | ICD-10-CM | POA: Insufficient documentation

## 2013-01-21 DIAGNOSIS — Z79899 Other long term (current) drug therapy: Secondary | ICD-10-CM | POA: Insufficient documentation

## 2013-01-21 DIAGNOSIS — Z8739 Personal history of other diseases of the musculoskeletal system and connective tissue: Secondary | ICD-10-CM | POA: Insufficient documentation

## 2013-01-21 DIAGNOSIS — K089 Disorder of teeth and supporting structures, unspecified: Secondary | ICD-10-CM | POA: Insufficient documentation

## 2013-01-21 DIAGNOSIS — K0889 Other specified disorders of teeth and supporting structures: Secondary | ICD-10-CM

## 2013-01-21 DIAGNOSIS — D649 Anemia, unspecified: Secondary | ICD-10-CM | POA: Insufficient documentation

## 2013-01-21 DIAGNOSIS — K029 Dental caries, unspecified: Secondary | ICD-10-CM | POA: Insufficient documentation

## 2013-01-21 DIAGNOSIS — G8929 Other chronic pain: Secondary | ICD-10-CM | POA: Insufficient documentation

## 2013-01-21 DIAGNOSIS — Z87891 Personal history of nicotine dependence: Secondary | ICD-10-CM | POA: Insufficient documentation

## 2013-01-21 MED ORDER — HYDROCODONE-ACETAMINOPHEN 5-325 MG PO TABS: 1.0000 | ORAL_TABLET | Freq: Four times a day (QID) | ORAL | Status: DC | PRN

## 2013-01-21 MED ORDER — HYDROCODONE-ACETAMINOPHEN 5-325 MG PO TABS
1.0000 | ORAL_TABLET | Freq: Once | ORAL | Status: AC
  Administered 2013-01-21: 1 via ORAL
  Filled 2013-01-21: qty 1

## 2013-01-21 NOTE — ED Provider Notes (Signed)
CSN: [id number]     Arrival date & time 01/21/13  1104 History   First MD Initiated Contact with Patient 01/21/13 1121     Chief Complaint  Patient presents with  . Dental Pain   (Consider location/radiation/quality/duration/timing/severity/associated sxs/prior Treatment) HPI Comments: Patient is a 47 yo F presenting to the ED for acute on chronic dental pain. Patient recently had three of her left lower teeth extracted by Dr. Barbette Merino this past Thursday and is having severe pain. She describes it as sharp with radiation to left side of her face. She states talking and eating worsen her pain, while her pain medication were helping until she ran out. Patient claims she called her oral surgeon's office today and was advised to come to the ED for pain management. Patient has four days left of her Clindamycin to finish. Denies fevers, chills, trismus, dysphagia.   Patient is a 47 y.o. female presenting with tooth pain.  Dental Pain Associated symptoms: no fever, no headaches and no neck pain     Past Medical History  Diagnosis Date  . Chronic pain   . Bulging lumbar disc   . Abnormal Pap smear   . Anxiety     Takes xanax  . Arthritis     Has rx for percocet  . Migraines   . Anemia    Past Surgical History  Procedure Laterality Date  . Cesarean section      X 3  . Cervical cone biopsy    . Gastric bypass  2007  . Cesarean section with bilateral tubal ligation Bilateral 10/28/2012    Procedure: Repeat cesarean section with delivery of baby boy at 1210. Apgars 8/9.  BILATERAL TUBAL LIGATION;  Surgeon: Lazaro Arms, MD;  Location: [facility];  Service: Obstetrics;  Laterality: Bilateral;   Family History  Problem Relation Age of Onset  . Diabetes Father   . Depression Maternal Grandmother   . Heart disease Maternal Grandfather   . Depression Paternal Grandmother    History  Substance Use Topics  . Smoking status: Former Smoker -- 0.50 packs/day for 10 years    Types: Cigarettes     Quit date: 03/16/2012  . Smokeless tobacco: Never Used  . Alcohol Use: No   OB History   Grav Para Term Preterm Abortions TAB SAB Ect Mult Living   6 4 4  0 2  2   4      Review of Systems  Constitutional: Negative for fever and chills.  HENT: Positive for dental problem. Negative for ear pain, sore throat, trouble swallowing, neck pain, neck stiffness and voice change.   Gastrointestinal: Negative for nausea and vomiting.  Neurological: Negative for headaches.    Allergies  Review of patient's allergies indicates no known allergies.  Home Medications   Current Outpatient Rx  Name  Route  Sig  Dispense  Refill  . Cyanocobalamin (VITAMIN B12 PO)   Oral   Take 1 tablet by mouth daily.         . ferrous sulfate 325 (65 FE) MG tablet   Oral   Take 325 mg by mouth 3 (three) times daily.          Marland Kitchen OVER THE COUNTER MEDICATION   Oral   Take 1 tablet by mouth daily. Biotin         . oxyCODONE-acetaminophen (PERCOCET) 10-325 MG per tablet   Oral   Take 1 tablet by mouth every 8 (eight) hours as needed for pain.         Marland Kitchen  HYDROcodone-acetaminophen (NORCO/VICODIN) 5-325 MG per tablet   Oral   Take 1 tablet by mouth every 6 (six) hours as needed for pain.   6 tablet   0    BP 126/73  Pulse 89  Temp(Src) 98.1 F (36.7 C) (Oral)  Resp 20  SpO2 100% Physical Exam  Constitutional: She is oriented to person, place, and time. She appears well-developed and well-nourished. No distress.  HENT:  Head: Normocephalic and atraumatic.  Right Ear: Tympanic membrane and external ear normal.  Left Ear: Tympanic membrane and external ear normal.  Nose: Nose normal.  Mouth/Throat: Uvula is midline and mucous membranes are normal. No trismus in the jaw. Abnormal dentition. Dental caries present. No dental abscesses or edematous. No oropharyngeal exudate or posterior oropharyngeal erythema.  Eyes: Conjunctivae are normal.  Neck: Neck supple.  Pulmonary/Chest: Effort normal.   Musculoskeletal: She exhibits no edema.  Neurological: She is alert and oriented to person, place, and time.  Skin: Skin is warm and dry. She is not diaphoretic.  Psychiatric: She has a normal mood and affect.    ED Course  Procedures (including critical care time) Labs Review Labs Reviewed - No data to display Imaging Review No results found.  MDM   1. Pain, dental    Afebrile, NAD, non-toxic appearing, AAOx4. Patient with toothache.  No gross abscess.  Exam unconcerning for Ludwig's angina or spread of infection.  Will treat with pain medicine. Advised to finish Clindamycin. Urged patient to follow-up with dentist at scheduled follow up appointment on Tuesday. Return precautions discussed. Patient is agreeable to plan. Patient is stable at time of discharge        Jeannetta Ellis, PA-C 01/21/13 1544

## 2013-01-21 NOTE — Discharge Instructions (Signed)
 Please keep follow up appointment with Dr. Barbette Merino on Tuesday. Please take pain medication as prescribed and as needed for pain. Please do not drive on narcotic pain medication. Please finish your antibiotic until completion as prescribed by Dr. Barbette Merino. Please read all discharge instructions and return precautions.    Dental Pain A tooth ache may be caused by cavities (tooth decay). Cavities expose the nerve of the tooth to air and hot or cold temperatures. It may come from an infection or abscess (also called a boil or furuncle) around your tooth. It is also often caused by dental caries (tooth decay). This causes the pain you are having. DIAGNOSIS  Your caregiver can diagnose this problem by exam. TREATMENT   If caused by an infection, it may be treated with medications which kill germs (antibiotics) and pain medications as prescribed by your caregiver. Take medications as directed.  Only take over-the-counter or prescription medicines for pain, discomfort, or fever as directed by your caregiver.  Whether the tooth ache today is caused by infection or dental disease, you should see your dentist as soon as possible for further care. SEEK MEDICAL CARE IF: The exam and treatment you received today has been provided on an emergency basis only. This is not a substitute for complete medical or dental care. If your problem worsens or new problems (symptoms) appear, and you are unable to meet with your dentist, call or return to this location. SEEK IMMEDIATE MEDICAL CARE IF:   You have a fever.  You develop redness and swelling of your face, jaw, or neck.  You are unable to open your mouth.  You have severe pain uncontrolled by pain medicine. MAKE SURE YOU:   Understand these instructions.  Will watch your condition.  Will get help right away if you are not doing well or get worse. Document Released: 04/12/2005 Document Revised: 07/05/2011 Document Reviewed: 11/29/2007 [facility] Patient  Information 2014 [place], [place].

## 2013-01-21 NOTE — ED Notes (Signed)
Pt had 3 teeth extracted Thursday and is having severe pain. Ran out of pain meds.

## 2013-01-23 NOTE — ED Provider Notes (Signed)
Medical screening examination/treatment/procedure(s) were performed by non-physician practitioner and as supervising physician I was immediately available for consultation/collaboration.   Joya Gaskins, MD 01/23/13 2252

## 2013-03-01 ENCOUNTER — Encounter: Payer: Self-pay | Admitting: Physical Medicine & Rehabilitation

## 2013-04-09 ENCOUNTER — Ambulatory Visit: Payer: Medicare HMO | Admitting: Physical Medicine & Rehabilitation

## 2013-05-11 ENCOUNTER — Encounter: Payer: Self-pay | Admitting: Physical Medicine & Rehabilitation

## 2013-05-11 ENCOUNTER — Encounter: Payer: Medicare HMO | Attending: Physical Medicine & Rehabilitation

## 2013-05-11 ENCOUNTER — Ambulatory Visit (HOSPITAL_BASED_OUTPATIENT_CLINIC_OR_DEPARTMENT_OTHER): Payer: Medicare HMO | Admitting: Physical Medicine & Rehabilitation

## 2013-05-11 VITALS — BP 136/74 | HR 92 | Resp 14 | Ht 60.0 in | Wt 174.0 lb

## 2013-05-11 DIAGNOSIS — G8929 Other chronic pain: Secondary | ICD-10-CM

## 2013-05-11 DIAGNOSIS — M7062 Trochanteric bursitis, left hip: Secondary | ICD-10-CM | POA: Insufficient documentation

## 2013-05-11 DIAGNOSIS — M25519 Pain in unspecified shoulder: Secondary | ICD-10-CM | POA: Insufficient documentation

## 2013-05-11 DIAGNOSIS — IMO0001 Reserved for inherently not codable concepts without codable children: Secondary | ICD-10-CM

## 2013-05-11 DIAGNOSIS — M545 Low back pain, unspecified: Secondary | ICD-10-CM | POA: Insufficient documentation

## 2013-05-11 DIAGNOSIS — Z5181 Encounter for therapeutic drug level monitoring: Secondary | ICD-10-CM

## 2013-05-11 DIAGNOSIS — M76899 Other specified enthesopathies of unspecified lower limb, excluding foot: Secondary | ICD-10-CM | POA: Insufficient documentation

## 2013-05-11 DIAGNOSIS — Z79899 Other long term (current) drug therapy: Secondary | ICD-10-CM

## 2013-05-11 DIAGNOSIS — M25569 Pain in unspecified knee: Secondary | ICD-10-CM

## 2013-05-11 DIAGNOSIS — Z9884 Bariatric surgery status: Secondary | ICD-10-CM | POA: Insufficient documentation

## 2013-05-11 DIAGNOSIS — M25559 Pain in unspecified hip: Secondary | ICD-10-CM

## 2013-05-11 DIAGNOSIS — M7580 Other shoulder lesions, unspecified shoulder: Principal | ICD-10-CM

## 2013-05-11 DIAGNOSIS — M25819 Other specified joint disorders, unspecified shoulder: Secondary | ICD-10-CM | POA: Insufficient documentation

## 2013-05-11 MED ORDER — GABAPENTIN 100 MG PO CAPS: 100.0000 mg | ORAL_CAPSULE | Freq: Three times a day (TID) | ORAL | Status: DC

## 2013-05-11 NOTE — Progress Notes (Signed)
Subjective:    Patient ID: Angie [name], female    DOB: Apr 12, 1966, 48 y.o.   MRN: [id number] Chief complaint: Left hip and left shoulder pain, but pain migrates day to day HPI Left hip pain started several years ago. Has had falls but no history of broken bones. Left hip x-ray was normal on 12/20/2012.  Also has a history of chronic right shoulder pains from several years ago no history of trauma. Xrays 2011 min AC jt arthropathy otherwise normal   left shoulder pain started one month ago patient suspects it may be related to carrying her baby to. Lifting, pushing, and pulling with the left hand seem to aggravate this pain. Overhead activities also activate pain in left shoulder  Patient is right handed   Gastric bypass, lost about 200lb but since pregnancy last summer has been gaining.  Has discussed with PCP but not surgeon  Hx knee pain no surgery or xray  Tried PT for back as well as TENS Has used lidocaine cream on wherever she hurts Had back injections at pain clinic in [place] Pain Inventory Average Pain 7 Pain Right Now 8 My pain is sharp, burning, dull, stabbing, tingling and aching  In the last 24 hours, has pain interfered with the following? General activity 6 Relation with others 7 Enjoyment of life 9 What TIME of day is your pain at its worst? evening, night Sleep (in general) Poor  Pain is worse with: walking, bending, sitting, inactivity, standing and some activites Pain improves with: rest, heat/ice, medication and TENS Relief from Meds: 9  Mobility walk without assistance ability to climb steps?  yes do you drive?  yes transfers alone  Function not employed: date last employed 10/1996 disabled: date disabled 1999 I need assistance with the following:  household duties  Neuro/Psych weakness numbness tingling spasms depression anxiety  Prior Studies Any changes since last visit?  yes x-rays CT/MRI  Physicians involved in your care Any  changes since last visit?  no   Family History  Problem Relation Age of Onset  . Diabetes Father   . Depression Maternal Grandmother   . Heart disease Maternal Grandfather   . Depression Paternal Grandmother    History   Social History  . Marital Status: Widowed    Spouse Name: N/A    Number of Children: N/A  . Years of Education: N/A   Social History Main Topics  . Smoking status: Former Smoker -- 0.50 packs/day for 10 years    Types: Cigarettes    Quit date: 03/16/2012  . Smokeless tobacco: Never Used  . Alcohol Use: No  . Drug Use: No  . Sexual Activity: Yes    Birth Control/ Protection: None     Comment: pregnant   Other Topics Concern  . Not on file   Social History Narrative  . No narrative on file   Past Surgical History  Procedure Laterality Date  . Cesarean section      X 3  . Cervical cone biopsy    . Gastric bypass  2007  . Cesarean section with bilateral tubal ligation Bilateral 10/28/2012    Procedure: Repeat cesarean section with delivery of baby boy at 17. Apgars 8/9.  BILATERAL TUBAL LIGATION;  Surgeon: Florian Buff, MD;  Location: [facility];  Service: Obstetrics;  Laterality: Bilateral;   Past Medical History  Diagnosis Date  . Chronic pain   . Bulging lumbar disc   . Abnormal Pap smear   . Anxiety  Takes xanax  . Arthritis     Has rx for percocet  . Migraines   . Anemia    There were no vitals taken for this visit.      Review of Systems  Constitutional: Positive for unexpected weight change.  Neurological: Positive for weakness and numbness.       Tingling, spasms  Psychiatric/Behavioral: Positive for dysphoric mood. The patient is nervous/anxious.   All other systems reviewed and are negative.       Objective:   Physical Exam  Nursing note and vitals reviewed. Constitutional: She is oriented to person, place, and time. She appears well-developed.  Morbid obesity  HENT:  Head: Normocephalic and atraumatic.  Eyes:  Conjunctivae and EOM are normal. Pupils are equal, round, and reactive to light.  Neck: Normal range of motion.  Musculoskeletal:       Left shoulder: She exhibits pain.  Knee range of motion with full extension flexion is limited by body habitus Hip internal and external rotation is normal.  Neurological: She is alert and oriented to person, place, and time. She has normal strength and normal reflexes. No sensory deficit. Gait abnormal.  Wide based gait secondary to body habitus  Psychiatric: She has a normal mood and affect.    Left trap, left periscapular, left lumbar, left hip ,left lat epicondyle tenderness at FM points  Cervical range of motion is normal  Positive impingement sign left shoulder at 90. Negative drop arm test  Motor strength 5/5 bilateral deltoid, bicep, tricep, grip, hip flexors, knee extensors, ankle dorsiflexor plantar flexor     Assessment & Plan:  1. Left subacromial impingement syndrome, and not responsive to conservative care will inject today  Shoulder injection   Indication:Left Shoulder pain not relieved by medication management and other conservative care.  Informed consent was obtained after describing risks and benefits of the procedure with the patient, this includes bleeding, bruising, infection and medication side effects. The patient wishes to proceed and has given written consent. Patient was placed in a seated position. The Left shoulder was marked and prepped with betadine in the subacromial area. A 25-gauge 1-1/2 inch needle was inserted into the subacromial area. After negative draw back for blood, a solution containing 1 mL of 6 mg per ML betamethasone and 4 mL of 1% lidocaine was injected. A band aid was applied. The patient tolerated the procedure well. Post procedure instructions were given.   2. Left trochanteric bursitis, may benefit from injection if does not resolve. Would do well with therapy however patient has limited time with an  infant to care for. In addition she has limited therapy visits from insurance  3. Chronic knee pain no x-rays to evaluate, will order. Patient states pain is severe. May benefit from visco supplementation  #4. Chronic low back pain. No evidence of radiculopathy. Indicates that pain is severe and long-standing. Has been treated with some type of spine injections but she does not have any records. Will check lumbosacral films to evaluate disc spaces well as spondylosis

## 2013-05-11 NOTE — Patient Instructions (Signed)

## 2013-06-15 ENCOUNTER — Encounter: Payer: Medicare HMO | Attending: Physical Medicine & Rehabilitation

## 2013-06-15 ENCOUNTER — Ambulatory Visit
Admission: RE | Admit: 2013-06-15 | Discharge: 2013-06-15 | Disposition: A | Discharge status: Home / Self Care | Payer: Medicare (Managed Care) | Source: Ambulatory Visit | Attending: Physical Medicine & Rehabilitation | Admitting: Physical Medicine & Rehabilitation

## 2013-06-15 ENCOUNTER — Encounter: Payer: Self-pay | Admitting: Physical Medicine & Rehabilitation

## 2013-06-15 ENCOUNTER — Other Ambulatory Visit: Payer: Self-pay | Admitting: Physical Medicine & Rehabilitation

## 2013-06-15 ENCOUNTER — Ambulatory Visit (HOSPITAL_BASED_OUTPATIENT_CLINIC_OR_DEPARTMENT_OTHER): Payer: Medicare HMO | Admitting: Physical Medicine & Rehabilitation

## 2013-06-15 VITALS — BP 140/81 | HR 105 | Resp 14 | Ht 60.0 in | Wt 185.0 lb

## 2013-06-15 DIAGNOSIS — M7542 Impingement syndrome of left shoulder: Secondary | ICD-10-CM

## 2013-06-15 DIAGNOSIS — G8929 Other chronic pain: Secondary | ICD-10-CM | POA: Insufficient documentation

## 2013-06-15 DIAGNOSIS — M545 Low back pain, unspecified: Secondary | ICD-10-CM | POA: Insufficient documentation

## 2013-06-15 DIAGNOSIS — M76899 Other specified enthesopathies of unspecified lower limb, excluding foot: Secondary | ICD-10-CM

## 2013-06-15 DIAGNOSIS — O9989 Other specified diseases and conditions complicating pregnancy, childbirth and the puerperium: Secondary | ICD-10-CM

## 2013-06-15 DIAGNOSIS — M1711 Unilateral primary osteoarthritis, right knee: Secondary | ICD-10-CM

## 2013-06-15 DIAGNOSIS — M25819 Other specified joint disorders, unspecified shoulder: Secondary | ICD-10-CM | POA: Insufficient documentation

## 2013-06-15 DIAGNOSIS — Z9884 Bariatric surgery status: Secondary | ICD-10-CM | POA: Insufficient documentation

## 2013-06-15 DIAGNOSIS — M75100 Unspecified rotator cuff tear or rupture of unspecified shoulder, not specified as traumatic: Secondary | ICD-10-CM

## 2013-06-15 DIAGNOSIS — IMO0002 Reserved for concepts with insufficient information to code with codable children: Secondary | ICD-10-CM

## 2013-06-15 DIAGNOSIS — M25519 Pain in unspecified shoulder: Secondary | ICD-10-CM | POA: Insufficient documentation

## 2013-06-15 DIAGNOSIS — M7580 Other shoulder lesions, unspecified shoulder: Principal | ICD-10-CM

## 2013-06-15 DIAGNOSIS — M25569 Pain in unspecified knee: Secondary | ICD-10-CM | POA: Insufficient documentation

## 2013-06-15 DIAGNOSIS — M1710 Unilateral primary osteoarthritis, unspecified knee: Secondary | ICD-10-CM

## 2013-06-15 DIAGNOSIS — M1712 Unilateral primary osteoarthritis, left knee: Secondary | ICD-10-CM

## 2013-06-15 DIAGNOSIS — O99891 Other specified diseases and conditions complicating pregnancy: Secondary | ICD-10-CM

## 2013-06-15 DIAGNOSIS — O26899 Other specified pregnancy related conditions, unspecified trimester: Secondary | ICD-10-CM

## 2013-06-15 DIAGNOSIS — M549 Dorsalgia, unspecified: Secondary | ICD-10-CM

## 2013-06-15 MED ORDER — OXYCODONE-ACETAMINOPHEN 10-325 MG PO TABS: 1.0000 | ORAL_TABLET | Freq: Two times a day (BID) | ORAL | Status: DC | PRN

## 2013-06-15 NOTE — Progress Notes (Signed)
Subjective:    Patient ID: Angie [name], female    DOB: 06-01-1965, 48 y.o.   MRN: [id number]  HPI Chief complaint is left shoulder pain. She had 100% relief after injection last visit approximately one month ago. The injection last for about 2 weeks.  Her hip pain is not a major issue at the current time. She like to discuss the x-rays of her knees as well as her low back.  She is also concerned about her left shoulder pain and would like to get an x-ray of that area as well Pain Inventory Average Pain 7 Pain Right Now 9 My pain is sharp, dull and aching  In the last 24 hours, has pain interfered with the following? General activity 4 Relation with others 4 Enjoyment of life 7 What TIME of day is your pain at its worst? morning, evening Sleep (in general) Poor  Pain is worse with: bending, sitting and inactivity Pain improves with: heat/ice, medication and injections Relief from Meds: 8  Mobility walk without assistance how many minutes can you walk? 15-20 ability to climb steps?  yes do you drive?  yes transfers alone  Function disabled: date disabled 1999  Neuro/Psych spasms depression anxiety  Prior Studies Any changes since last visit?  yes x-rays  Physicians involved in your care Any changes since last visit?  no   Family History  Problem Relation Age of Onset  . Diabetes Father   . Depression Maternal Grandmother   . Heart disease Maternal Grandfather   . Depression Paternal Grandmother    History   Social History  . Marital Status: Widowed    Spouse Name: N/A    Number of Children: N/A  . Years of Education: N/A   Social History Main Topics  . Smoking status: Former Smoker -- 0.50 packs/day for 10 years    Types: Cigarettes    Quit date: 03/16/2012  . Smokeless tobacco: Never Used  . Alcohol Use: No  . Drug Use: No  . Sexual Activity: Yes    Birth Control/ Protection: None     Comment: pregnant   Other Topics Concern  . None     Social History Narrative  . None   Past Surgical History  Procedure Laterality Date  . Cesarean section      X 3  . Cervical cone biopsy    . Gastric bypass  2007  . Cesarean section with bilateral tubal ligation Bilateral 10/28/2012    Procedure: Repeat cesarean section with delivery of baby boy at 71. Apgars 8/9.  BILATERAL TUBAL LIGATION;  Surgeon: Florian Buff, MD;  Location: [facility];  Service: Obstetrics;  Laterality: Bilateral;  . Gastric bypass     Past Medical History  Diagnosis Date  . Chronic pain   . Bulging lumbar disc   . Abnormal Pap smear   . Anxiety     Takes xanax  . Arthritis     Has rx for percocet  . Migraines   . Anemia    BP 140/81  Pulse 105  Resp 14  Ht 5' (1.524 m)  Wt 185 lb (83.915 kg)  BMI 36.13 kg/m2  SpO2 100%  LMP 05/31/2013  Opioid Risk Score:   Fall Risk Score: Low Fall Risk (0-5 points) (pt educated on fall risk, brochure given to pt.)    Review of Systems  Constitutional: Positive for unexpected weight change.  Musculoskeletal: Positive for back pain.  Neurological:       Spasms  Psychiatric/Behavioral: Positive for dysphoric mood. The patient is nervous/anxious.   All other systems reviewed and are negative.       Objective:   Physical Exam General no acute distress Mood and affect are appropriate Left shoulder has no evidence of deformity No evidence of subluxation Positive Hawkins test Normal strength in the shoulder girdle. Ambulation without toe drag R. knee instability      Assessment & Plan:  1. Left subacromial impingement syndrome, and not responsive to conservative care will inject today Will check x-rays to evaluate for down sloping acromion Follow this with physical therapy  internal/external rotator strengthening , scapular stabilization exercises Shoulder injection  Indication:Left Shoulder pain not relieved by medication management and other conservative care.  Informed consent was obtained after  describing risks and benefits of the procedure with the patient, this includes bleeding, bruising, infection and medication side effects. The patient wishes to proceed and has given written consent. Patient was placed in a seated position. The Left shoulder was marked and prepped with betadine in the subacromial area. A 25-gauge 1-1/2 inch needle was inserted into the subacromial area. After negative draw back for blood, a solution containing 1 mL of 6 mg per ML betamethasone and 4 mL of 1% lidocaine was injected. A band aid was applied. The patient tolerated the procedure well. Post procedure instructions were given.  #2. Left trochanteric bursitis this actually is doing better. No injection needed today 3. 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 Consider Visco supplementation Continue low dose oxycodone 10 mg twice a day with Lidoderm cream to supplement  #4. Chronic non radicular low back pain x-rays unremarkable likely related to myofascial and postural issues

## 2013-06-28 ENCOUNTER — Ambulatory Visit: Payer: Medicare HMO | Attending: Physical Medicine & Rehabilitation

## 2013-06-28 DIAGNOSIS — M25619 Stiffness of unspecified shoulder, not elsewhere classified: Secondary | ICD-10-CM | POA: Insufficient documentation

## 2013-06-28 DIAGNOSIS — IMO0001 Reserved for inherently not codable concepts without codable children: Secondary | ICD-10-CM | POA: Insufficient documentation

## 2013-06-28 DIAGNOSIS — M2550 Pain in unspecified joint: Secondary | ICD-10-CM | POA: Insufficient documentation

## 2013-06-28 DIAGNOSIS — R293 Abnormal posture: Secondary | ICD-10-CM | POA: Insufficient documentation

## 2013-07-04 ENCOUNTER — Encounter: Payer: Medicare (Managed Care) | Admitting: Physical Therapy

## 2013-07-05 ENCOUNTER — Encounter: Payer: Medicare (Managed Care) | Admitting: Physical Therapy

## 2013-07-06 ENCOUNTER — Encounter: Payer: Medicare HMO | Attending: Physical Medicine & Rehabilitation

## 2013-07-06 ENCOUNTER — Encounter: Payer: Self-pay | Admitting: Physical Medicine & Rehabilitation

## 2013-07-06 ENCOUNTER — Ambulatory Visit
Admission: RE | Admit: 2013-07-06 | Discharge: 2013-07-06 | Disposition: A | Discharge status: Home / Self Care | Payer: Commercial Managed Care - HMO | Source: Ambulatory Visit | Attending: Physical Medicine & Rehabilitation | Admitting: Physical Medicine & Rehabilitation

## 2013-07-06 ENCOUNTER — Ambulatory Visit (HOSPITAL_BASED_OUTPATIENT_CLINIC_OR_DEPARTMENT_OTHER): Payer: Commercial Managed Care - HMO | Admitting: Physical Medicine & Rehabilitation

## 2013-07-06 VITALS — BP 144/83 | HR 100 | Resp 14 | Ht 60.0 in | Wt 185.0 lb

## 2013-07-06 DIAGNOSIS — M7580 Other shoulder lesions, unspecified shoulder: Secondary | ICD-10-CM

## 2013-07-06 DIAGNOSIS — M25519 Pain in unspecified shoulder: Secondary | ICD-10-CM | POA: Insufficient documentation

## 2013-07-06 DIAGNOSIS — M76899 Other specified enthesopathies of unspecified lower limb, excluding foot: Secondary | ICD-10-CM

## 2013-07-06 DIAGNOSIS — M25819 Other specified joint disorders, unspecified shoulder: Secondary | ICD-10-CM | POA: Insufficient documentation

## 2013-07-06 DIAGNOSIS — M7542 Impingement syndrome of left shoulder: Secondary | ICD-10-CM | POA: Insufficient documentation

## 2013-07-06 DIAGNOSIS — M7062 Trochanteric bursitis, left hip: Secondary | ICD-10-CM

## 2013-07-06 DIAGNOSIS — Z87891 Personal history of nicotine dependence: Secondary | ICD-10-CM | POA: Insufficient documentation

## 2013-07-06 DIAGNOSIS — M545 Low back pain, unspecified: Secondary | ICD-10-CM | POA: Insufficient documentation

## 2013-07-06 DIAGNOSIS — M179 Osteoarthritis of knee, unspecified: Secondary | ICD-10-CM

## 2013-07-06 DIAGNOSIS — Z9884 Bariatric surgery status: Secondary | ICD-10-CM | POA: Insufficient documentation

## 2013-07-06 DIAGNOSIS — IMO0002 Reserved for concepts with insufficient information to code with codable children: Secondary | ICD-10-CM

## 2013-07-06 DIAGNOSIS — M75100 Unspecified rotator cuff tear or rupture of unspecified shoulder, not specified as traumatic: Secondary | ICD-10-CM

## 2013-07-06 DIAGNOSIS — M159 Polyosteoarthritis, unspecified: Secondary | ICD-10-CM | POA: Insufficient documentation

## 2013-07-06 DIAGNOSIS — G8929 Other chronic pain: Secondary | ICD-10-CM | POA: Insufficient documentation

## 2013-07-06 DIAGNOSIS — M1710 Unilateral primary osteoarthritis, unspecified knee: Secondary | ICD-10-CM

## 2013-07-06 MED ORDER — OXYCODONE-ACETAMINOPHEN 10-325 MG PO TABS: 1.0000 | ORAL_TABLET | Freq: Two times a day (BID) | ORAL | Status: DC | PRN

## 2013-07-06 MED ORDER — GABAPENTIN 300 MG PO CAPS: 300.0000 mg | ORAL_CAPSULE | Freq: Three times a day (TID) | ORAL | Status: DC

## 2013-07-06 NOTE — Patient Instructions (Addendum)
Continue outpatient therapy Third injection today Musculoskeletal ultrasound left shoulder next visit

## 2013-07-06 NOTE — Progress Notes (Signed)
Subjective:    Patient ID: Angie [name], female    DOB: 06-15-1965, 48 y.o.   MRN: [id number]  HPI On physical therapy now. Working on shoulder left side. X-ray of the left shoulder unremarkable mild left a.c. joint arthritis Straight lumbar spine unremarkable Knee x-rays show moderate tri compartmental osteoarthritis.  Left subacromial injection had good temporary relief. Pain is once again severe however Pain Inventory Average Pain 7 Pain Right Now 9 My pain is sharp, dull and aching  In the last 24 hours, has pain interfered with the following? General activity 4 Relation with others 4 Enjoyment of life 7 What TIME of day is your pain at its worst? evening Sleep (in general) Poor  Pain is worse with: bending, sitting and inactivity Pain improves with: heat/ice, medication and injections Relief from Meds: 8  Mobility walk without assistance how many minutes can you walk? 15-20 ability to climb steps?  yes do you drive?  yes transfers alone  Function disabled: date disabled 1999  Neuro/Psych spasms depression anxiety  Prior Studies x-rays  Physicians involved in your care Any changes since last visit?  no   Family History  Problem Relation Age of Onset  . Diabetes Father   . Depression Maternal Grandmother   . Heart disease Maternal Grandfather   . Depression Paternal Grandmother    History   Social History  . Marital Status: Widowed    Spouse Name: N/A    Number of Children: N/A  . Years of Education: N/A   Social History Main Topics  . Smoking status: Former Smoker -- 0.50 packs/day for 10 years    Types: Cigarettes    Quit date: 03/16/2012  . Smokeless tobacco: Never Used  . Alcohol Use: No  . Drug Use: No  . Sexual Activity: Yes    Birth Control/ Protection: None     Comment: pregnant   Other Topics Concern  . None   Social History Narrative  . None   Past Surgical History  Procedure Laterality Date  . Cesarean section     X 3  . Cervical cone biopsy    . Gastric bypass  2007  . Cesarean section with bilateral tubal ligation Bilateral 10/28/2012    Procedure: Repeat cesarean section with delivery of baby boy at 69. Apgars 8/9.  BILATERAL TUBAL LIGATION;  Surgeon: Florian Buff, MD;  Location: [facility];  Service: Obstetrics;  Laterality: Bilateral;  . Gastric bypass     Past Medical History  Diagnosis Date  . Chronic pain   . Bulging lumbar disc   . Abnormal Pap smear   . Anxiety     Takes xanax  . Arthritis     Has rx for percocet  . Migraines   . Anemia    BP 144/83  Pulse 100  Resp 14  Ht 5' (1.524 m)  Wt 185 lb (83.915 kg)  BMI 36.13 kg/m2  SpO2 100%  LMP 05/31/2013  Opioid Risk Score:   Fall Risk Score: Low Fall Risk (0-5 points) (patient educated handout declined)   Review of Systems  Psychiatric/Behavioral: Positive for dysphoric mood. The patient is nervous/anxious.   All other systems reviewed and are negative.       Objective:   Physical Exam  Positive impingement sign left shoulder mild pain with abduction Negative drop arm test. Stabilization maneuver relieved his pain Grind test causes pain.      Assessment & Plan:  1.  moderate knee osteoarthritis. Good relief  with Synvisc in the past. Has had no injections for greater than 6 months. We'll schedule for repeat. Right side first  2.  left subacromial impingement syndrome has responded to injection but only temporary. Undergoing physical therapy. We'll schedule for musculoskeletal ultrasound of the left shoulder. Shoulder injection  Indication:Left Shoulder pain not relieved by medication management and other conservative care.  Informed consent was obtained after describing risks and benefits of the procedure with the patient, this includes bleeding, bruising, infection and medication side effects. The patient wishes to proceed and has given written consent. Patient was placed in a seated position. The Left shoulder was  marked and prepped with betadine in the subacromial area. A 25-gauge 1-1/2 inch needle was inserted into the subacromial area. After negative draw back for blood, a solution containing 1 mL of 6 mg per ML betamethasone and 4 mL of 1% lidocaine was injected. A band aid was applied. The patient tolerated the procedure well. Post procedure instructions were given.    Repeat one more left subacromial injection today  3. Left hip trochanteric bursitis overall doing better we'll continue to monitor.  #4. Chronic not particularly low back pain x-rays looked unremarkable. Appears to be related to posture, weight

## 2013-07-10 ENCOUNTER — Ambulatory Visit: Payer: Medicare HMO

## 2013-07-12 ENCOUNTER — Ambulatory Visit: Payer: Medicare HMO

## 2013-07-16 ENCOUNTER — Ambulatory Visit: Payer: Medicare (Managed Care) | Admitting: Physical Medicine & Rehabilitation

## 2013-07-17 ENCOUNTER — Ambulatory Visit: Payer: Medicare HMO | Admitting: Physical Therapy

## 2013-07-19 ENCOUNTER — Ambulatory Visit: Payer: Medicare HMO | Admitting: Physical Therapy

## 2013-07-24 ENCOUNTER — Encounter: Payer: Commercial Managed Care - HMO | Admitting: Physical Therapy

## 2013-07-26 ENCOUNTER — Ambulatory Visit: Payer: Medicare HMO | Attending: Physical Medicine & Rehabilitation | Admitting: Physical Therapy

## 2013-07-26 DIAGNOSIS — R293 Abnormal posture: Secondary | ICD-10-CM | POA: Insufficient documentation

## 2013-07-26 DIAGNOSIS — M2550 Pain in unspecified joint: Secondary | ICD-10-CM | POA: Insufficient documentation

## 2013-07-26 DIAGNOSIS — M25619 Stiffness of unspecified shoulder, not elsewhere classified: Secondary | ICD-10-CM | POA: Insufficient documentation

## 2013-07-26 DIAGNOSIS — IMO0001 Reserved for inherently not codable concepts without codable children: Secondary | ICD-10-CM | POA: Insufficient documentation

## 2013-07-31 ENCOUNTER — Ambulatory Visit: Payer: Medicare HMO | Admitting: Physical Therapy

## 2013-08-03 ENCOUNTER — Encounter: Payer: Self-pay | Admitting: Registered Nurse

## 2013-08-03 ENCOUNTER — Encounter: Payer: Commercial Managed Care - HMO | Attending: Physical Medicine & Rehabilitation | Admitting: Registered Nurse

## 2013-08-03 VITALS — BP 124/75 | HR 104 | Resp 14 | Ht 61.0 in | Wt 189.0 lb

## 2013-08-03 DIAGNOSIS — M25819 Other specified joint disorders, unspecified shoulder: Secondary | ICD-10-CM | POA: Insufficient documentation

## 2013-08-03 DIAGNOSIS — M75100 Unspecified rotator cuff tear or rupture of unspecified shoulder, not specified as traumatic: Secondary | ICD-10-CM

## 2013-08-03 DIAGNOSIS — M545 Low back pain, unspecified: Secondary | ICD-10-CM | POA: Insufficient documentation

## 2013-08-03 DIAGNOSIS — M7542 Impingement syndrome of left shoulder: Secondary | ICD-10-CM

## 2013-08-03 DIAGNOSIS — M7580 Other shoulder lesions, unspecified shoulder: Principal | ICD-10-CM

## 2013-08-03 DIAGNOSIS — M7062 Trochanteric bursitis, left hip: Secondary | ICD-10-CM

## 2013-08-03 DIAGNOSIS — M25519 Pain in unspecified shoulder: Secondary | ICD-10-CM | POA: Insufficient documentation

## 2013-08-03 DIAGNOSIS — M25569 Pain in unspecified knee: Secondary | ICD-10-CM | POA: Insufficient documentation

## 2013-08-03 DIAGNOSIS — IMO0002 Reserved for concepts with insufficient information to code with codable children: Secondary | ICD-10-CM

## 2013-08-03 DIAGNOSIS — M76899 Other specified enthesopathies of unspecified lower limb, excluding foot: Secondary | ICD-10-CM | POA: Insufficient documentation

## 2013-08-03 DIAGNOSIS — G8929 Other chronic pain: Secondary | ICD-10-CM | POA: Insufficient documentation

## 2013-08-03 DIAGNOSIS — Z9884 Bariatric surgery status: Secondary | ICD-10-CM | POA: Insufficient documentation

## 2013-08-03 DIAGNOSIS — M179 Osteoarthritis of knee, unspecified: Secondary | ICD-10-CM

## 2013-08-03 DIAGNOSIS — M1710 Unilateral primary osteoarthritis, unspecified knee: Secondary | ICD-10-CM

## 2013-08-03 MED ORDER — OXYCODONE-ACETAMINOPHEN 10-325 MG PO TABS: 1.0000 | ORAL_TABLET | Freq: Two times a day (BID) | ORAL | Status: DC | PRN

## 2013-08-03 NOTE — Patient Instructions (Signed)
Hip Exercises RANGE OF MOTION (ROM) AND STRETCHING EXERCISES  These exercises may help you when beginning to rehabilitate your injury. Doing them too aggressively can worsen your condition. Complete them slowly and gently. Your symptoms may resolve with or without further involvement from your physician, physical therapist or athletic trainer. While completing these exercises, remember:   Restoring tissue flexibility helps normal motion to return to the joints. This allows healthier, less painful movement and activity.  An effective stretch should be held for at least 30 seconds.  A stretch should never be painful. You should only feel a gentle lengthening or release in the stretched tissue. If these stretches worsen your symptoms even when done gently, consult your physician, physical therapist or athletic trainer. STRETCH Hamstrings, Supine   Lie on your back. Loop a belt or towel over the ball of your right / left foot.  Straighten your right / left knee and slowly pull on the belt to raise your leg. Do not allow the right / left knee to bend. Keep your opposite leg flat on the floor.  Raise the leg until you feel a gentle stretch behind your right / left knee or thigh. Hold this position for __________ seconds. Repeat __________ times. Complete this stretch __________ times per day.  STRETCH - Hip Rotators   Lie on your back on a firm surface. Grasp your right / left knee with your right / left hand and your ankle with your opposite hand.  Keeping your hips and shoulders firmly planted, gently pull your right / left knee and rotate your lower leg toward your opposite shoulder until you feel a stretch in your buttocks.  Hold this stretch for __________ seconds. Repeat this stretch __________ times. Complete this stretch __________ times per day. STRETCH - Hamstrings/Adductors, V-Sit   Sit on the floor with your legs extended in a large "V," keeping your knees straight.  With your head  and chest upright, bend at your waist reaching for your right foot to stretch your left adductors.  You should feel a stretch in your left inner thigh. Hold for __________ seconds.  Return to the upright position to relax your leg muscles.  Continuing to keep your chest upright, bend straight forward at your waist to stretch your hamstrings.  You should feel a stretch behind both of your thighs and/or knees. Hold for __________ seconds.  Return to the upright position to relax your leg muscles.  Repeat steps 2 through 4 for opposite leg. Repeat __________ times. Complete this exercise __________ times per day.  STRETCHING - Hip Flexors, Lunge  Half kneel with your right / left knee on the floor and your opposite knee bent and directly over your ankle.  Keep good posture with your head over your shoulders. Tighten your buttocks to point your tailbone downward; this will prevent your back from arching too much.  You should feel a gentle stretch in the front of your thigh and/or hip. If you do not feel any resistance, slightly slide your opposite foot forward and then slowly lunge forward so your knee once again lines up over your ankle. Be sure your tailbone remains pointed downward.  Hold this stretch for __________ seconds. Repeat __________ times. Complete this stretch __________ times per day. STRENGTHENING EXERCISES These exercises may help you when beginning to rehabilitate your injury. They may resolve your symptoms with or without further involvement from your physician, physical therapist or athletic trainer. While completing these exercises, remember:   Muscles can  gain both the endurance and the strength needed for everyday activities through controlled exercises.  Complete these exercises as instructed by your physician, physical therapist or athletic trainer. Progress the resistance and repetitions only as guided.  You may experience muscle soreness or fatigue, but the pain  or discomfort you are trying to eliminate should never worsen during these exercises. If this pain does worsen, stop and make certain you are following the directions exactly. If the pain is still present after adjustments, discontinue the exercise until you can discuss the trouble with your clinician. STRENGTH - Hip Extensors, Bridge   Lie on your back on a firm surface. Bend your knees and place your feet flat on the floor.  Tighten your buttocks muscles and lift your bottom off the floor until your trunk is level with your thighs. You should feel the muscles in your buttocks and back of your thighs working. If you do not feel these muscles, slide your feet 1-2 inches further away from your buttocks.  Hold this position for __________ seconds.  Slowly lower your hips to the starting position and allow your buttock muscles relax completely before beginning the next repetition.  If this exercise is too easy, you may cross your arms over your chest. Repeat __________ times. Complete this exercise __________ times per day.  STRENGTH - Hip Abductors, Straight Leg Raises  Be aware of your form throughout the entire exercise so that you exercise the correct muscles. Sloppy form means that you are not strengthening the correct muscles.  Lie on your side so that your head, shoulders, knee and hip line up. You may bend your lower knee to help maintain your balance. Your right / left leg should be on top.  Roll your hips slightly forward, so that your hips are stacked directly over each other and your right / left knee is facing forward.  Lift your top leg up 4-6 inches, leading with your heel. Be sure that your foot does not drift forward or that your knee does not roll toward the ceiling.  Hold this position for __________ seconds. You should feel the muscles in your outer hip lifting (you may not notice this until your leg begins to tire).  Slowly lower your leg to the starting position. Allow the  muscles to fully relax before beginning the next repetition. Repeat __________ times. Complete this exercise __________ times per day.  STRENGTH - Hip Adductors, Straight Leg Raises   Lie on your side so that your head, shoulders, knee and hip line up. You may place your upper foot in front to help maintain your balance. Your right / left leg should be on the bottom.  Roll your hips slightly forward, so that your hips are stacked directly over each other and your right / left knee is facing forward.  Tense the muscles in your inner thigh and lift your bottom leg 4-6 inches. Hold this position for __________ seconds.  Slowly lower your leg to the starting position. Allow the muscles to fully relax before beginning the next repetition. Repeat __________ times. Complete this exercise __________ times per day.  STRENGTH - Quadriceps, Straight Leg Raises  Quality counts! Watch for signs that the quadriceps muscle is working to insure you are strengthening the correct muscles and not "cheating" by substituting with healthier muscles.  Lay on your back with your right / left leg extended and your opposite knee bent.  Tense the muscles in the front of your right / left thigh.  You should see either your knee cap slide up or increased dimpling just above the knee. Your thigh may even quiver.  Tighten these muscles even more and raise your leg 4 to 6 inches off the floor. Hold for right / left seconds.  Keeping these muscles tense, lower your leg.  Relax the muscles slowly and completely in between each repetition. Repeat __________ times. Complete this exercise __________ times per day.  STRENGTH - Hip Abductors, Standing  Tie one end of a rubber exercise band/tubing to a secure surface (table, pole) and tie a loop at the other end.  Place the loop around your right / left ankle. Keeping your ankle with the band directly opposite of the secured end, step away until there is tension in the  tube/band.  Hold onto a chair as needed for balance.  Keeping your back upright, your shoulders over your hips, and your toes pointing forward, lift your right / left leg out to your side. Be sure to lift your leg with your hip muscles. Do not "throw" your leg or tip your body to lift your leg.  Slowly and with control, return to the starting position. Repeat exercise __________ times. Complete this exercise __________ times per day.  STRENGTH  Quadriceps, Squats  Stand in a door frame so that your feet and knees are in line with the frame.  Use your hands for balance, not support, on the frame.  Slowly lower your weight, bending at the hips and knees. Keep your lower legs upright so that they are parallel with the door frame. Squat only within the range that does not increase your knee pain. Never let your hips drop below your knees.  Slowly return upright, pushing with your legs, not pulling with your hands. Document Released: 04/30/2005 Document Revised: 07/05/2011 Document Reviewed: 07/25/2008 [facility] Patient Information 2014 [place], [place]. Back Exercises Back exercises help treat and prevent back injuries. The goal is to increase your strength in your belly (abdominal) and back muscles. These exercises can also help with flexibility. Start these exercises when told by your doctor. HOME CARE Back exercises include: Pelvic Tilt.  Lie on your back with your knees bent. Tilt your pelvis until the lower part of your back is against the floor. Hold this position 5 to 10 sec. Repeat this exercise 5 to 10 times. Knee to Chest.  Pull 1 knee up against your chest and hold for 20 to 30 seconds. Repeat this with the other knee. This may be done with the other leg straight or bent, whichever feels better. Then, pull both knees up against your chest. Sit-Ups or Curl-Ups.  Bend your knees 90 degrees. Start with tilting your pelvis, and do a partial, slow sit-up. Only lift your upper half 30  to 45 degrees off the floor. Take at least 2 to 3 seonds for each sit-up. Do not do sit-ups with your knees out straight. If partial sit-ups are difficult, simply do the above but with only tightening your belly (abdominal) muscles and holding it as told. Hip-Lift.  Lie on your back with your knees flexed 90 degrees. Push down with your feet and shoulders as you raise your hips 2 inches off the floor. Hold for 10 seconds, repeat 5 to 10 times. Back Arches.  Lie on your stomach. Prop yourself up on bent elbows. Slowly press on your hands, causing an arch in your low back. Repeat 3 to 5 times. Shoulder-Lifts.  Lie face down with arms beside your body.  Keep hips and belly pressed to floor as you slowly lift your head and shoulders off the floor. Do not overdo your exercises. Be careful in the beginning. Exercises may cause you some mild back discomfort. If the pain lasts for more than 15 minutes, stop the exercises until you see your doctor. Improvement with exercise for back problems is slow.  Document Released: 05/15/2010 Document Revised: 07/05/2011 Document Reviewed: 02/11/2011 [facility] Patient Information 2014 [place], [place].

## 2013-08-03 NOTE — Progress Notes (Signed)
Subjective:    Patient ID: Angie [name], female    DOB: 18-Dec-1965, 48 y.o.   MRN: [id number]  HPI: [name] is a 48 year old female who returns for follow up for chronic pain and medication refill. Her pain is located lower back, bilaterally knees right > left and left shoulder. She's going to outpatient therapy twice a week. Their working on her left rotator cuff and strengthening exercises. She uses her TENS unit,heat and massage therapy this provides great comfort for her. Her current exercise regime is stretching. She rates her pain 3/10. Last week she was having more pain and admits to increasing frequency of Percocet. She was educated on compliance of medication and verbalized understanding.Wilburn Mylar her knee gave out and she stumble, she was able to regain her balance. Educated on Fall prevention.   Pain Inventory Average Pain 7 Pain Right Now 3 My pain is constant, burning and aching  In the last 24 hours, has pain interfered with the following? General activity 5 Relation with others 6 Enjoyment of life 7 What TIME of day is your pain at its worst? day and evening Sleep (in general) Poor  Pain is worse with: walking, bending, sitting, standing and some activites Pain improves with: rest, heat/ice, therapy/exercise, medication, TENS and injections Relief from Meds: 8  Mobility walk without assistance how many minutes can you walk? 10-15 ability to climb steps?  yes do you drive?  yes transfers alone  Function disabled: date disabled 1999 I need assistance with the following:  meal prep and household duties Do you have any goals in this area?  yes  Neuro/Psych tingling spasms depression anxiety  Prior Studies Any changes since last visit?  no  Physicians involved in your care Any changes since last visit?  no   Family History  Problem Relation Age of Onset  . Diabetes Father   . Depression Maternal Grandmother   . Heart disease Maternal  Grandfather   . Depression Paternal Grandmother    History   Social History  . Marital Status: Widowed    Spouse Name: N/A    Number of Children: N/A  . Years of Education: N/A   Social History Main Topics  . Smoking status: Former Smoker -- 0.50 packs/day for 10 years    Types: Cigarettes    Quit date: 03/16/2012  . Smokeless tobacco: Never Used  . Alcohol Use: No  . Drug Use: No  . Sexual Activity: Yes    Birth Control/ Protection: None     Comment: pregnant   Other Topics Concern  . None   Social History Narrative  . None   Past Surgical History  Procedure Laterality Date  . Cesarean section      X 3  . Cervical cone biopsy    . Gastric bypass  2007  . Cesarean section with bilateral tubal ligation Bilateral 10/28/2012    Procedure: Repeat cesarean section with delivery of baby boy at 16. Apgars 8/9.  BILATERAL TUBAL LIGATION;  Surgeon: Florian Buff, MD;  Location: [facility];  Service: Obstetrics;  Laterality: Bilateral;  . Gastric bypass     Past Medical History  Diagnosis Date  . Chronic pain   . Bulging lumbar disc   . Abnormal Pap smear   . Anxiety     Takes xanax  . Arthritis     Has rx for percocet  . Migraines   . Anemia    BP 124/75  Pulse  104  Resp 14  Ht 5\' 1"  (1.549 m)  Wt 189 lb (85.73 kg)  BMI 35.73 kg/m2  SpO2 100%  Opioid Risk Score:   Fall Risk Score: Low Fall Risk (0-5 points) (Patient edcuated handout declined)   Review of Systems  Constitutional: Positive for diaphoresis and unexpected weight change.  Gastrointestinal: Positive for nausea and abdominal pain.  Musculoskeletal: Positive for back pain.  Neurological:       Tingling, spasm  Psychiatric/Behavioral: Positive for dysphoric mood. The patient is nervous/anxious.   All other systems reviewed and are negative.      Objective:   Physical Exam  Nursing note and vitals reviewed. Constitutional: She is oriented to person, place, and time. She appears well-developed and  well-nourished.  HENT:  Head: Normocephalic.  Neck: Neck supple.  Cardiovascular: Normal rate, regular rhythm and normal heart sounds.   Pulmonary/Chest: Effort normal and breath sounds normal.  Musculoskeletal:  Normal Muscle Bulk: Muscle Testing Reveals: Upper Extremities 5/5. Left arm decreased ROM 90 degrees. Right arm 180 degrees. Right leg flexion: Tightness noted and pain radiates to upper thigh. Left leg flexion: tightness and pain radiates to left hip.  Neurological: She is alert and oriented to person, place, and time.  Skin: Skin is warm and dry.  Psychiatric: She has a normal mood and affect.          Assessment & Plan:  1. Moderate knee osteoarthritis. Continue Stretching Exercise and Refill: oxyCODONE 10/325 mg #60 2. left subacromial impingement syndrome:Undergoing physical therapy. She's schedule for musculoskeletal ultrasound of the left shoulder and cortisone injection with Dr. Letta Pate.  3. Chronic Low Back Pain: Continue exercise and posture exercises. Continue to use TENS unit. Using heat and massage therapy.  30 minutes of face to face patient care time was spent during this visit. All questions were encouraged and answered.

## 2013-08-07 ENCOUNTER — Encounter: Payer: Commercial Managed Care - HMO | Admitting: Physical Therapy

## 2013-08-08 ENCOUNTER — Ambulatory Visit: Payer: Medicare HMO

## 2013-08-09 ENCOUNTER — Encounter: Payer: Commercial Managed Care - HMO | Admitting: Physical Therapy

## 2013-08-10 ENCOUNTER — Ambulatory Visit: Payer: Commercial Managed Care - HMO | Admitting: Physical Medicine & Rehabilitation

## 2013-08-13 ENCOUNTER — Telehealth: Payer: Self-pay

## 2013-08-13 MED ORDER — LIDOCAINE 5 % EX OINT: 1.0000 "application " | TOPICAL_OINTMENT | Freq: Three times a day (TID) | CUTANEOUS | Status: DC

## 2013-08-13 NOTE — Telephone Encounter (Signed)
Lidocaine ointment sent to pharmacy.  Patient aware.

## 2013-08-13 NOTE — Telephone Encounter (Signed)
May order lidocaine ointment 5%, 60 g apply to right shoulder and right hip 3 times per day as needed one refill

## 2013-08-13 NOTE — Telephone Encounter (Signed)
Patient states her PCP will not continue prescribing her Lidocaine Ointment and advised her to contact Dr. Letta Pate to prescribe the Lidocaine.

## 2013-08-14 ENCOUNTER — Ambulatory Visit: Payer: Medicare HMO

## 2013-08-23 ENCOUNTER — Ambulatory Visit: Payer: Medicare HMO

## 2013-08-30 ENCOUNTER — Telehealth: Payer: Self-pay

## 2013-08-30 ENCOUNTER — Encounter: Payer: Commercial Managed Care - HMO | Attending: Physical Medicine & Rehabilitation

## 2013-08-30 ENCOUNTER — Ambulatory Visit: Payer: Commercial Managed Care - HMO | Admitting: Physical Medicine & Rehabilitation

## 2013-08-30 DIAGNOSIS — Z9884 Bariatric surgery status: Secondary | ICD-10-CM | POA: Insufficient documentation

## 2013-08-30 DIAGNOSIS — M25519 Pain in unspecified shoulder: Secondary | ICD-10-CM | POA: Insufficient documentation

## 2013-08-30 DIAGNOSIS — Z87891 Personal history of nicotine dependence: Secondary | ICD-10-CM | POA: Insufficient documentation

## 2013-08-30 DIAGNOSIS — M545 Low back pain, unspecified: Secondary | ICD-10-CM | POA: Insufficient documentation

## 2013-08-30 DIAGNOSIS — G8929 Other chronic pain: Secondary | ICD-10-CM | POA: Insufficient documentation

## 2013-08-30 DIAGNOSIS — M76899 Other specified enthesopathies of unspecified lower limb, excluding foot: Secondary | ICD-10-CM | POA: Insufficient documentation

## 2013-08-30 DIAGNOSIS — M159 Polyosteoarthritis, unspecified: Secondary | ICD-10-CM | POA: Insufficient documentation

## 2013-08-30 DIAGNOSIS — M25819 Other specified joint disorders, unspecified shoulder: Secondary | ICD-10-CM | POA: Insufficient documentation

## 2013-08-30 DIAGNOSIS — M7580 Other shoulder lesions, unspecified shoulder: Secondary | ICD-10-CM

## 2013-08-30 NOTE — Telephone Encounter (Signed)
No appointments available until the 13th.

## 2013-08-30 NOTE — Telephone Encounter (Signed)
Patient is requesting a refill on Oxycodone. Last fill date 4/10. She was a "no show" for her appt today (5/7) then called later to reschedule. The new appt is 6/8. Please advise.

## 2013-08-30 NOTE — Telephone Encounter (Signed)
Can she be seen by Zella Ball this week?

## 2013-08-30 NOTE — Telephone Encounter (Signed)
May bridge current prescription until the 13th

## 2013-08-30 NOTE — Telephone Encounter (Signed)
Please advise directions.  

## 2013-08-31 MED ORDER — OXYCODONE-ACETAMINOPHEN 10-325 MG PO TABS: 1.0000 | ORAL_TABLET | Freq: Two times a day (BID) | ORAL | Status: DC | PRN

## 2013-08-31 NOTE — Telephone Encounter (Signed)
Oxycodone Rx printed.  Patient aware rx is ready for pick up.

## 2013-08-31 NOTE — Telephone Encounter (Signed)
Oxycodone 10/325 one po BID

## 2013-10-01 ENCOUNTER — Ambulatory Visit (HOSPITAL_BASED_OUTPATIENT_CLINIC_OR_DEPARTMENT_OTHER): Payer: Commercial Managed Care - HMO | Admitting: Physical Medicine & Rehabilitation

## 2013-10-01 ENCOUNTER — Telehealth: Payer: Self-pay

## 2013-10-01 ENCOUNTER — Encounter: Payer: Medicare HMO | Attending: Physical Medicine & Rehabilitation

## 2013-10-01 ENCOUNTER — Encounter: Payer: Self-pay | Admitting: Physical Medicine & Rehabilitation

## 2013-10-01 VITALS — BP 139/77 | HR 78 | Resp 14 | Ht 61.0 in | Wt 180.0 lb

## 2013-10-01 DIAGNOSIS — IMO0002 Reserved for concepts with insufficient information to code with codable children: Secondary | ICD-10-CM

## 2013-10-01 DIAGNOSIS — M545 Low back pain, unspecified: Secondary | ICD-10-CM | POA: Insufficient documentation

## 2013-10-01 DIAGNOSIS — M25819 Other specified joint disorders, unspecified shoulder: Secondary | ICD-10-CM | POA: Insufficient documentation

## 2013-10-01 DIAGNOSIS — M76899 Other specified enthesopathies of unspecified lower limb, excluding foot: Secondary | ICD-10-CM | POA: Insufficient documentation

## 2013-10-01 DIAGNOSIS — Z87891 Personal history of nicotine dependence: Secondary | ICD-10-CM | POA: Insufficient documentation

## 2013-10-01 DIAGNOSIS — M159 Polyosteoarthritis, unspecified: Secondary | ICD-10-CM | POA: Insufficient documentation

## 2013-10-01 DIAGNOSIS — M75100 Unspecified rotator cuff tear or rupture of unspecified shoulder, not specified as traumatic: Secondary | ICD-10-CM

## 2013-10-01 DIAGNOSIS — M7542 Impingement syndrome of left shoulder: Secondary | ICD-10-CM

## 2013-10-01 DIAGNOSIS — G8929 Other chronic pain: Secondary | ICD-10-CM | POA: Insufficient documentation

## 2013-10-01 DIAGNOSIS — Z9884 Bariatric surgery status: Secondary | ICD-10-CM | POA: Insufficient documentation

## 2013-10-01 DIAGNOSIS — M25519 Pain in unspecified shoulder: Secondary | ICD-10-CM | POA: Insufficient documentation

## 2013-10-01 DIAGNOSIS — M7580 Other shoulder lesions, unspecified shoulder: Secondary | ICD-10-CM

## 2013-10-01 MED ORDER — OXYCODONE-ACETAMINOPHEN 10-325 MG PO TABS: 1.0000 | ORAL_TABLET | Freq: Two times a day (BID) | ORAL | Status: DC | PRN

## 2013-10-01 MED ORDER — LIDOCAINE HCL 4 % EX SOLN: Freq: Three times a day (TID) | CUTANEOUS | Status: DC | PRN

## 2013-10-01 NOTE — Telephone Encounter (Signed)
Patient called to clarify that she was getting the lidocaine ointment 5% and not the cream.  She would also like it sent to [place].

## 2013-10-01 NOTE — Patient Instructions (Signed)
Refill of lidocaine gel 4%  Needs to bring pill bottle with each visit, did not bring one on the March or the June visits. Prescription will be given after patient brings in pill bottle

## 2013-10-01 NOTE — Telephone Encounter (Signed)
It looks like the insurance only pays for the cream. If they want. ointment it would cost more according to the message I get on my computer

## 2013-10-01 NOTE — Progress Notes (Signed)
Left shoulder subacromial bursa injection under ultrasound guidance Indication is left subacromial impingement syndrome which did not respond well to conservative care or to a palpation guided subacromial injection   Informed consent was obtained after describing risks and benefits of the procedure with the patient, this includes bleeding, bruising, infection and medication side effects. The patient wishes to proceed and has given written consent. Patient was placed in a seated position. The Left shoulder was marked and prepped with betadine in the subacromial area. A 25-gauge 1-1/2 inch needle was inserted into the subacromial area. After negative draw back for blood, a solution containing 1 mL of 6 mg per ML betamethasone and 4 mL of 1% lidocaine was injected. A band aid was applied. The patient tolerated the procedure well. Post procedure instructions were given.

## 2013-10-02 NOTE — Telephone Encounter (Signed)
Left message advising patient to call back about her lidocaine request.

## 2013-10-02 NOTE — Telephone Encounter (Signed)
Tried to contact patient regarding her request for lidocaine.  Per Dr Letta Pate, patients insurance may not cover the ointment.

## 2013-10-30 ENCOUNTER — Other Ambulatory Visit: Payer: Self-pay

## 2013-10-30 ENCOUNTER — Encounter: Payer: Self-pay | Admitting: Registered Nurse

## 2013-10-30 ENCOUNTER — Encounter: Payer: Medicare HMO | Attending: Physical Medicine & Rehabilitation | Admitting: Registered Nurse

## 2013-10-30 ENCOUNTER — Ambulatory Visit: Payer: Commercial Managed Care - HMO | Admitting: Registered Nurse

## 2013-10-30 VITALS — BP 142/75 | HR 97 | Resp 14 | Ht 61.0 in | Wt 180.0 lb

## 2013-10-30 DIAGNOSIS — Z79899 Other long term (current) drug therapy: Secondary | ICD-10-CM | POA: Insufficient documentation

## 2013-10-30 DIAGNOSIS — M76899 Other specified enthesopathies of unspecified lower limb, excluding foot: Secondary | ICD-10-CM | POA: Insufficient documentation

## 2013-10-30 DIAGNOSIS — M75100 Unspecified rotator cuff tear or rupture of unspecified shoulder, not specified as traumatic: Secondary | ICD-10-CM

## 2013-10-30 DIAGNOSIS — M1710 Unilateral primary osteoarthritis, unspecified knee: Secondary | ICD-10-CM | POA: Diagnosis present

## 2013-10-30 DIAGNOSIS — M7542 Impingement syndrome of left shoulder: Secondary | ICD-10-CM

## 2013-10-30 DIAGNOSIS — Z5181 Encounter for therapeutic drug level monitoring: Secondary | ICD-10-CM | POA: Diagnosis present

## 2013-10-30 DIAGNOSIS — IMO0002 Reserved for concepts with insufficient information to code with codable children: Secondary | ICD-10-CM | POA: Insufficient documentation

## 2013-10-30 MED ORDER — OXYCODONE-ACETAMINOPHEN 10-325 MG PO TABS: 1.0000 | ORAL_TABLET | Freq: Two times a day (BID) | ORAL | Status: DC | PRN
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
## 2013-11-26 ENCOUNTER — Telehealth: Payer: Self-pay

## 2013-11-26 ENCOUNTER — Encounter: Payer: Medicare HMO | Attending: Physical Medicine & Rehabilitation | Admitting: Registered Nurse

## 2013-11-26 DIAGNOSIS — Z5181 Encounter for therapeutic drug level monitoring: Secondary | ICD-10-CM | POA: Insufficient documentation

## 2013-11-26 DIAGNOSIS — IMO0002 Reserved for concepts with insufficient information to code with codable children: Secondary | ICD-10-CM | POA: Insufficient documentation

## 2013-11-26 DIAGNOSIS — M75100 Unspecified rotator cuff tear or rupture of unspecified shoulder, not specified as traumatic: Secondary | ICD-10-CM | POA: Insufficient documentation

## 2013-11-26 DIAGNOSIS — Z79899 Other long term (current) drug therapy: Secondary | ICD-10-CM | POA: Insufficient documentation

## 2013-11-26 DIAGNOSIS — M76899 Other specified enthesopathies of unspecified lower limb, excluding foot: Secondary | ICD-10-CM | POA: Insufficient documentation

## 2013-11-26 DIAGNOSIS — M1710 Unilateral primary osteoarthritis, unspecified knee: Secondary | ICD-10-CM | POA: Insufficient documentation

## 2013-11-26 NOTE — Telephone Encounter (Signed)
Patient is requesting a new appt. She states she was out of town and thought she would be back in time for her appt today.

## 2013-11-27 ENCOUNTER — Other Ambulatory Visit: Payer: Self-pay

## 2013-11-27 ENCOUNTER — Telehealth: Payer: Self-pay

## 2013-11-27 MED ORDER — OXYCODONE-ACETAMINOPHEN 10-325 MG PO TABS: 1.0000 | ORAL_TABLET | Freq: Two times a day (BID) | ORAL | Status: DC | PRN

## 2013-11-27 NOTE — Telephone Encounter (Signed)
RX printed to the bridge appt on 8/24 Oxycodone #38 due to Dr. Letta Pate being out of the office all week.

## 2013-11-27 NOTE — Telephone Encounter (Signed)
Attempted to contact patient. Number on file is disconnected.

## 2013-11-27 NOTE — Telephone Encounter (Signed)
Patient is requesting a refill on Oxycodone. Patient no showed for her appt on 8/3. Patient called later and stated she was out of town. She no showed/canceled appts in April and May.  Next available appt with Zella Ball is 8/19 @ 2:30 which I went ahead and scheduled. She has a appt with Dr. Letta Pate on 8/24 for a injection. Please advise.

## 2013-11-27 NOTE — Telephone Encounter (Signed)
RX printed to bridge until appt on 8/24. Oxycodone #38 due to Dr. Letta Pate being out of the office all week. Attempted to contact patient no answer nor voicemail.

## 2013-11-28 NOTE — Telephone Encounter (Signed)
Attempted to contact patient for second time. No answer nor voicemail.

## 2013-11-29 ENCOUNTER — Encounter: Payer: Self-pay | Admitting: Physical Medicine & Rehabilitation

## 2013-12-01 NOTE — Telephone Encounter (Signed)
noted 

## 2013-12-12 ENCOUNTER — Ambulatory Visit: Payer: Commercial Managed Care - HMO | Admitting: Registered Nurse

## 2013-12-17 ENCOUNTER — Encounter: Payer: Medicare HMO | Attending: Physical Medicine & Rehabilitation

## 2013-12-17 ENCOUNTER — Other Ambulatory Visit: Payer: Self-pay | Admitting: Internal Medicine

## 2013-12-17 ENCOUNTER — Encounter: Payer: Self-pay | Admitting: Physical Medicine & Rehabilitation

## 2013-12-17 ENCOUNTER — Other Ambulatory Visit: Payer: Self-pay

## 2013-12-17 ENCOUNTER — Ambulatory Visit (HOSPITAL_BASED_OUTPATIENT_CLINIC_OR_DEPARTMENT_OTHER): Payer: Commercial Managed Care - HMO | Admitting: Physical Medicine & Rehabilitation

## 2013-12-17 VITALS — BP 139/77 | HR 95 | Resp 14 | Ht 60.0 in | Wt 174.0 lb

## 2013-12-17 DIAGNOSIS — Z1231 Encounter for screening mammogram for malignant neoplasm of breast: Secondary | ICD-10-CM

## 2013-12-17 DIAGNOSIS — G8929 Other chronic pain: Secondary | ICD-10-CM | POA: Diagnosis not present

## 2013-12-17 DIAGNOSIS — M7580 Other shoulder lesions, unspecified shoulder: Secondary | ICD-10-CM

## 2013-12-17 DIAGNOSIS — M76899 Other specified enthesopathies of unspecified lower limb, excluding foot: Secondary | ICD-10-CM | POA: Diagnosis not present

## 2013-12-17 DIAGNOSIS — M25819 Other specified joint disorders, unspecified shoulder: Secondary | ICD-10-CM | POA: Diagnosis not present

## 2013-12-17 DIAGNOSIS — IMO0002 Reserved for concepts with insufficient information to code with codable children: Secondary | ICD-10-CM

## 2013-12-17 DIAGNOSIS — M25519 Pain in unspecified shoulder: Secondary | ICD-10-CM | POA: Insufficient documentation

## 2013-12-17 DIAGNOSIS — Z9884 Bariatric surgery status: Secondary | ICD-10-CM | POA: Diagnosis not present

## 2013-12-17 DIAGNOSIS — M75100 Unspecified rotator cuff tear or rupture of unspecified shoulder, not specified as traumatic: Secondary | ICD-10-CM

## 2013-12-17 DIAGNOSIS — M159 Polyosteoarthritis, unspecified: Secondary | ICD-10-CM | POA: Insufficient documentation

## 2013-12-17 DIAGNOSIS — M545 Low back pain, unspecified: Secondary | ICD-10-CM | POA: Diagnosis not present

## 2013-12-17 DIAGNOSIS — Z87891 Personal history of nicotine dependence: Secondary | ICD-10-CM | POA: Diagnosis not present

## 2013-12-17 DIAGNOSIS — M7542 Impingement syndrome of left shoulder: Secondary | ICD-10-CM

## 2013-12-17 MED ORDER — OXYCODONE-ACETAMINOPHEN 10-325 MG PO TABS: 1.0000 | ORAL_TABLET | Freq: Two times a day (BID) | ORAL | Status: DC | PRN

## 2013-12-17 NOTE — Progress Notes (Signed)
   Subjective:    Patient ID: Angie [name], female    DOB: 12-06-1965, 48 y.o.   MRN: [id number]  HPI    Review of Systems     Objective:   Physical Exam        Assessment & Plan:  Left shoulder subacromial bursa injection under ultrasound guidance Indication is left subacromial impingement syndrome which did not respond well to conservative care or to a palpation guided subacromial injection   Informed consent was obtained after describing risks and benefits of the procedure with the patient, this includes bleeding, bruising, infection and medication side effects. The patient wishes to proceed and has given written consent. Patient was placed in a seated position. The Left shoulder was marked and prepped with betadine in the subacromial area. A 25-gauge 1-1/2 inch needle was inserted into the subacromial area. After negative draw back for blood, a solution containing 1 mL of 6 mg per ML betamethasone and 4 mL of 1% lidocaine was injected. A band aid was applied. The patient tolerated the procedure well. Post procedure instructions were given.

## 2013-12-18 ENCOUNTER — Encounter: Payer: Self-pay | Admitting: Physical Medicine & Rehabilitation

## 2013-12-28 ENCOUNTER — Ambulatory Visit: Payer: Commercial Managed Care - HMO

## 2014-01-07 ENCOUNTER — Ambulatory Visit: Payer: Commercial Managed Care - HMO

## 2014-01-22 ENCOUNTER — Encounter: Payer: Commercial Managed Care - HMO | Attending: Physical Medicine & Rehabilitation

## 2014-01-22 ENCOUNTER — Ambulatory Visit: Payer: Commercial Managed Care - HMO | Admitting: Physical Medicine & Rehabilitation

## 2014-01-22 DIAGNOSIS — M159 Polyosteoarthritis, unspecified: Secondary | ICD-10-CM | POA: Insufficient documentation

## 2014-01-22 DIAGNOSIS — Z87891 Personal history of nicotine dependence: Secondary | ICD-10-CM | POA: Insufficient documentation

## 2014-01-22 DIAGNOSIS — M25819 Other specified joint disorders, unspecified shoulder: Secondary | ICD-10-CM | POA: Insufficient documentation

## 2014-01-22 DIAGNOSIS — M545 Low back pain, unspecified: Secondary | ICD-10-CM | POA: Insufficient documentation

## 2014-01-22 DIAGNOSIS — M25519 Pain in unspecified shoulder: Secondary | ICD-10-CM | POA: Insufficient documentation

## 2014-01-22 DIAGNOSIS — M7580 Other shoulder lesions, unspecified shoulder: Secondary | ICD-10-CM

## 2014-01-22 DIAGNOSIS — M76899 Other specified enthesopathies of unspecified lower limb, excluding foot: Secondary | ICD-10-CM | POA: Insufficient documentation

## 2014-01-22 DIAGNOSIS — G8929 Other chronic pain: Secondary | ICD-10-CM | POA: Insufficient documentation

## 2014-01-22 DIAGNOSIS — Z9884 Bariatric surgery status: Secondary | ICD-10-CM | POA: Insufficient documentation

## 2014-02-14 ENCOUNTER — Encounter (HOSPITAL_COMMUNITY): Payer: Self-pay | Admitting: Emergency Medicine

## 2014-02-14 ENCOUNTER — Emergency Department (INDEPENDENT_AMBULATORY_CARE_PROVIDER_SITE_OTHER)
Admission: EM | Admit: 2014-02-14 | Discharge: 2014-02-14 | Disposition: A | Discharge status: Home / Self Care | Payer: Commercial Managed Care - HMO | Source: Home / Self Care | Attending: Family Medicine | Admitting: Family Medicine

## 2014-02-14 DIAGNOSIS — M25561 Pain in right knee: Secondary | ICD-10-CM

## 2014-02-14 MED ORDER — PREDNISONE 10 MG PO KIT: PACK | ORAL | Status: DC

## 2014-02-14 NOTE — Discharge Instructions (Signed)
Thank you for coming in today. Follow up with Dr. Alfonso Ramus.   Meniscus Tear with Phase I Rehab The meniscus is a C-shaped cartilage structure, located in the knee joint between the thigh bone (femur) and the shinbone (tibia). Two menisci are located in each knee joint: the inner and outer meniscus. The meniscus acts as an adapter between the thigh bone and shinbone, allowing them to fit properly together. It also functions as a shock absorber, to reduce the stress placed on the knee joint and to help supply nutrients to the knee joint cartilage. As people age, the meniscus begins to harden and become more vulnerable to injury. Meniscus tears are a common injury, especially in older athletes. Inner meniscus tears are more common than outer meniscus tears.  SYMPTOMS   Pain in the knee, especially with standing or squatting with the affected leg.  Tenderness along the joint line.  Swelling in the knee joint (effusion), usually starting 1 to 2 days after injury.  Locking or catching of the knee joint, causing inability to straighten the knee completely.  Giving way or buckling of the knee. CAUSES  A meniscus tear occurs when a force is placed on the meniscus that is greater than it can handle. Common causes of injury include:  Direct hit (trauma) to the knee.  Twisting, pivoting, or cutting (rapidly changing direction while running), kneeling or squatting.  Without injury, due to aging. RISK INCREASES WITH:  Contact sports (football, rugby).  Sports in which cleats are used with pivoting (soccer, lacrosse) or sports in which good shoe grip and sudden change in direction are required (racquetball, basketball, squash).  Previous knee injury.  Associated knee injury, particularly ligament injuries.  Poor strength and flexibility. PREVENTION  Warm up and stretch properly before activity.  Maintain physical fitness:  Strength, flexibility, and endurance.  Cardiovascular  fitness.  Protect the knee with a brace or elastic bandage.  Wear properly fitted protective equipment (proper cleats for the surface). PROGNOSIS  Sometimes, meniscus tears heal on their own. However, definitive treatment requires surgery, followed by at least 6 weeks of recovery.  RELATED COMPLICATIONS   Recurring symptoms that result in a chronic problem.  Repeated knee injury, especially if sports are resumed too soon after injury or surgery.  Progression of the tear (the tear gets larger), if untreated.  Arthritis of the knee in later years (with or without surgery).  Complications of surgery, including infection, bleeding, injury to nerves (numbness, weakness, paralysis) continued pain, giving way, locking, nonhealing of meniscus (if repaired), need for further surgery, and knee stiffness (loss of motion). TREATMENT  Treatment first involves the use of ice and medicine, to reduce pain and inflammation. You may find using crutches to walk more comfortable. However, it is okay to bear weight on the injured knee, if the pain will allow it. Surgery is often advised as a definitive treatment. Surgery is performed through an incision near the joint (arthroscopically). The torn piece of the meniscus is removed, and if possible the joint cartilage is repaired. After surgery, the joint must be restrained. After restraint, it is important to perform strengthening and stretching exercises to help regain strength and a full range of motion. These exercises may be completed at home or with a therapist.  MEDICATION  If pain medicine is needed, nonsteroidal anti-inflammatory medicines (aspirin and ibuprofen), or other minor pain relievers (acetaminophen), are often advised.  Do not take pain medicine for 7 days before surgery.  Prescription pain relievers may be  given, if your caregiver thinks they are needed. Use only as directed and only as much as you need. HEAT AND COLD  Cold treatment (icing)  should be applied for 10 to 15 minutes every 2 to 3 hours for inflammation and pain, and immediately after activity that aggravates your symptoms. Use ice packs or an ice massage.  Heat treatment may be used before performing stretching and strengthening activities prescribed by your caregiver, physical therapist, or athletic trainer. Use a heat pack or a warm water soak. SEEK MEDICAL CARE IF:   Symptoms get worse or do not improve in 2 weeks, despite treatment.  New, unexplained symptoms develop. (Drugs used in treatment may produce side effects.) EXERCISES RANGE OF MOTION (ROM) AND STRETCHING EXERCISES - Meniscus Tear, Non-operative, Phase I These are some of the initial exercises with which you may start your rehabilitation program, until you see your caregiver again or until your symptoms are resolved. Remember:   These initial exercises are intended to be gentle. They will help you restore motion without increasing any swelling.  Completing these exercises allows less painful movement and prepares you for the more aggressive strengthening exercises in Phase II.  An effective stretch should be held for at least 30 seconds.  A stretch should never be painful. You should only feel a gentle lengthening or release in the stretched tissue. RANGE OF MOTION - Knee Flexion, Active  Lie on your back with both knees straight. (If this causes back discomfort, bend your healthy knee, placing your foot flat on the floor.)  Slowly slide your heel back toward your buttocks until you feel a gentle stretch in the front of your knee or thigh.  Hold for __________ seconds. Slowly slide your heel back to the starting position. Repeat __________ times. Complete this exercise __________ times per day.  RANGE OF MOTION - Knee Flexion and Extension, Active-Assisted  Sit on the edge of a table or chair with your thighs firmly supported. It may be helpful to place a folded towel under the end of your right /  left thigh.  Flexion (bending): Place the ankle of your healthy leg on top of the other ankle. Use your healthy leg to gently bend your right / left knee until you feel a mild tension across the top of your knee.  Hold for __________ seconds.  Extension (straightening): Switch your ankles so your right / left leg is on top. Use your healthy leg to straighten your right / left knee until you feel a mild tension on the backside of your knee.  Hold for __________ seconds. Repeat __________ times. Complete __________ times per day. STRETCH - Knee Flexion, Supine  Lie on the floor with your right / left heel and foot lightly touching the wall. (Place both feet on the wall if you do not use a door frame.)  Without using any effort, allow gravity to slide your foot down the wall slowly until you feel a gentle stretch in the front of your right / left knee.  Hold this stretch for __________ seconds. Then return the leg to the starting position, using your healthy leg for help, if needed. Repeat __________ times. Complete this stretch __________ times per day.  STRETCH - Knee Extension Sitting  Sit with your right / left leg/heel propped on another chair, coffee table, or foot stool.  Allow your leg muscles to relax, letting gravity straighten out your knee.*  You should feel a stretch behind your right / left knee.  Hold this position for __________ seconds. Repeat __________ times. Complete this stretch __________ times per day.  *Your physician, physical therapist or athletic trainer may instruct you place a __________ weight on your thigh, just above your kneecap, to deepen the stretch.  STRENGTHENING EXERCISES - Meniscus Tear, Non-operative, Phase I These exercises may help you when beginning to rehabilitate your injury. They may resolve your symptoms with or without further involvement from your physician, physical therapist or athletic trainer. While completing these exercises, remember:    Muscles can gain both the endurance and the strength needed for everyday activities through controlled exercises.  Complete these exercises as instructed by your physician, physical therapist or athletic trainer. Progress the resistance and repetitions only as guided. STRENGTH - Quadriceps, Isometrics  Lie on your back with your right / left leg extended and your opposite knee bent.  Gradually tense the muscles in the front of your right / left thigh. You should see either your knee cap slide up toward your hip or increased dimpling just above the knee. This motion will push the back of the knee down toward the floor, mat, or bed on which you are lying.  Hold the muscle as tight as you can, without increasing your pain, for __________ seconds.  Relax the muscles slowly and completely between each repetition. Repeat __________ times. Complete this exercise __________ times per day.  STRENGTH - Quadriceps, Short Arcs   Lie on your back. Place a __________ inch towel roll under your right / left knee, so that the knee bends slightly.  Raise only your lower leg by tightening the muscles in the front of your thigh. Do not allow your thigh to rise.  Hold this position for __________ seconds. Repeat __________ times. Complete this exercise __________ times per day.  OPTIONAL ANKLE WEIGHTS: Begin with ____________________, but DO NOT exceed ____________________. Increase in 1 pound/0.5 kilogram increments. STRENGTH - Quadriceps, Straight Leg Raises  Quality counts! Watch for signs that the quadriceps muscle is working, to be sure you are strengthening the correct muscles and not "cheating" by substituting with healthier muscles.  Lay on your back with your right / left leg extended and your opposite knee bent.  Tense the muscles in the front of your right / left thigh. You should see either your knee cap slide up or increased dimpling just above the knee. Your thigh may even shake a  bit.  Tighten these muscles even more and raise your leg 4 to 6 inches off the floor. Hold for __________ seconds.  Keeping these muscles tense, lower your leg.  Relax the muscles slowly and completely in between each repetition. Repeat __________ times. Complete this exercise __________ times per day.  STRENGTH - Hamstring, Curls   Lay on your stomach with your legs extended. (If you lay on a bed, your feet may hang over the edge.)  Tighten the muscles in the back of your thigh to bend your right / left knee up to 90 degrees. Keep your hips flat on the bed.  Hold this position for __________ seconds.  Slowly lower your leg back to the starting position. Repeat __________ times. Complete this exercise __________ times per day.  STRENGTH - Quadriceps, Squats  Stand in a door frame so that your feet and knees are in line with the frame.  Use your hands for balance, not support, on the frame.  Slowly lower your weight, bending at the hips and knees. Keep your lower legs upright so that they  are parallel with the door frame. Squat only within the range that does not increase your knee pain. Never let your hips drop below your knees.  Slowly return upright, pushing with your legs, not pulling with your hands. Repeat __________ times. Complete this exercise __________ times per day.  STRENGTH - Quad/VMO, Isometric   Sit in a chair with your right / left knee slightly bent. With your fingertips, feel the VMO muscle just above the inside of your knee. The VMO is important in controlling the position of your kneecap.  Keeping your fingertips on this muscle. Without actually moving your leg, attempt to drive your knee down as if straightening your leg. You should feel your VMO tense. If you have a difficult time, you may wish to try the same exercise on your healthy knee first.  Tense this muscle as hard as you can without increasing any knee pain.  Hold for __________ seconds. Relax the  muscles slowly and completely in between each repetition. Repeat __________ times. Complete exercise __________ times per day.  Document Released: 04/26/1998 Document Revised: 07/05/2011 Document Reviewed: 07/25/2008 [facility] Patient Information 2015 [place], [place]. This information is not intended to replace advice given to you by your health care provider. Make sure you discuss any questions you have with your health care provider.

## 2014-02-14 NOTE — ED Notes (Signed)
C/o right knee pain since yesterday.  Pain is sharp and felt at front of knee.  Denies injury.  Mild relief with tylenol and heating pad.

## 2014-02-14 NOTE — ED Provider Notes (Signed)
Angie [name] is a 48 y.o. female who presents to Urgent Care today for right anterior knee pain. Patient has a one-day history of right knee pain. She denies any injury. She has a history of bilateral knee DJD for which she is completely disabled and takes Percocet daily. This pain however is new. The pain is located in the anterior medial aspect of the knee it is worse with activity. No radiating pain weakness or numbness. No trouble walking.   Past Medical History  Diagnosis Date  . Chronic pain   . Bulging lumbar disc   . Abnormal Pap smear   . Anxiety     Takes xanax  . Arthritis     Has rx for percocet  . Migraines   . Anemia    History  Substance Use Topics  . Smoking status: Former Smoker -- 0.50 packs/day for 10 years    Types: Cigarettes    Quit date: 03/16/2012  . Smokeless tobacco: Never Used  . Alcohol Use: No   ROS as above Medications: No current facility-administered medications for this encounter.   Current Outpatient Prescriptions  Medication Sig Dispense Refill  . ALPRAZolam (XANAX) 1 MG tablet       . gabapentin (NEURONTIN) 300 MG capsule Take 1 capsule (300 mg total) by mouth 3 (three) times daily.  90 capsule  1  . buPROPion (WELLBUTRIN XL) 150 MG 24 hr tablet       . Cyanocobalamin (VITAMIN B12 PO) Take 1 tablet by mouth daily.      . ferrous sulfate 325 (65 FE) MG tablet Take 325 mg by mouth 3 (three) times daily.       Marland Kitchen lidocaine (XYLOCAINE) 4 % external solution Apply topically 3 (three) times daily as needed.  50 mL  5  . lidocaine (XYLOCAINE) 5 % ointment       . OVER THE COUNTER MEDICATION Take 1 tablet by mouth daily. Biotin      . oxyCODONE-acetaminophen (PERCOCET) 10-325 MG per tablet Take 1 tablet by mouth 2 (two) times daily as needed for pain.  60 tablet  0  . PredniSONE 10 MG KIT 12 day dose pack po  1 kit  0  . TRI-SPRINTEC 0.18/0.215/0.25 MG-35 MCG tablet         Exam:  BP 174/94  Pulse 80  Temp(Src) 98.3 F (36.8 C) (Oral)  Resp  16  SpO2 100% Gen: Well NAD Right knee: Normal-appearing no significant effusion. Tender palpation medial joint line. Range of motion 0-120. Positive medial McMurray's test. Stable ligamentous exam.  No results found for this or any previous visit (from the past 24 hour(s)). No results found.  Assessment and Plan: 48 y.o. female with probable right knee degenerative meniscus injury. Treatment with prednisone dose pack. Followup with orthopedics. Continued chronic pain management  Discussed warning signs or symptoms. Please see discharge instructions. Patient expresses understanding.     Gregor Hams, MD 02/14/14 [phone]

## 2014-02-25 ENCOUNTER — Encounter (HOSPITAL_COMMUNITY): Payer: Self-pay | Admitting: Emergency Medicine

## 2014-07-20 ENCOUNTER — Emergency Department (HOSPITAL_COMMUNITY): Payer: No Typology Code available for payment source

## 2014-07-20 ENCOUNTER — Encounter (HOSPITAL_COMMUNITY): Payer: Self-pay | Admitting: Emergency Medicine

## 2014-07-20 ENCOUNTER — Emergency Department (HOSPITAL_COMMUNITY)
Admission: EM | Admit: 2014-07-20 | Discharge: 2014-07-20 | Disposition: A | Discharge status: Home / Self Care | Payer: No Typology Code available for payment source | Attending: Emergency Medicine | Admitting: Emergency Medicine

## 2014-07-20 DIAGNOSIS — Z79899 Other long term (current) drug therapy: Secondary | ICD-10-CM | POA: Insufficient documentation

## 2014-07-20 DIAGNOSIS — S4991XA Unspecified injury of right shoulder and upper arm, initial encounter: Secondary | ICD-10-CM | POA: Insufficient documentation

## 2014-07-20 DIAGNOSIS — M542 Cervicalgia: Secondary | ICD-10-CM | POA: Diagnosis not present

## 2014-07-20 DIAGNOSIS — M1990 Unspecified osteoarthritis, unspecified site: Secondary | ICD-10-CM | POA: Diagnosis not present

## 2014-07-20 DIAGNOSIS — G8929 Other chronic pain: Secondary | ICD-10-CM | POA: Diagnosis not present

## 2014-07-20 DIAGNOSIS — Z87891 Personal history of nicotine dependence: Secondary | ICD-10-CM | POA: Diagnosis not present

## 2014-07-20 DIAGNOSIS — M549 Dorsalgia, unspecified: Secondary | ICD-10-CM | POA: Diagnosis not present

## 2014-07-20 DIAGNOSIS — Y92410 Unspecified street and highway as the place of occurrence of the external cause: Secondary | ICD-10-CM | POA: Diagnosis not present

## 2014-07-20 DIAGNOSIS — M545 Low back pain: Secondary | ICD-10-CM | POA: Diagnosis not present

## 2014-07-20 DIAGNOSIS — S29001A Unspecified injury of muscle and tendon of front wall of thorax, initial encounter: Secondary | ICD-10-CM | POA: Insufficient documentation

## 2014-07-20 DIAGNOSIS — S3993XA Unspecified injury of pelvis, initial encounter: Secondary | ICD-10-CM | POA: Diagnosis not present

## 2014-07-20 DIAGNOSIS — G43909 Migraine, unspecified, not intractable, without status migrainosus: Secondary | ICD-10-CM | POA: Diagnosis not present

## 2014-07-20 DIAGNOSIS — S299XXA Unspecified injury of thorax, initial encounter: Secondary | ICD-10-CM | POA: Diagnosis not present

## 2014-07-20 DIAGNOSIS — M791 Myalgia, unspecified site: Secondary | ICD-10-CM

## 2014-07-20 DIAGNOSIS — Y939 Activity, unspecified: Secondary | ICD-10-CM | POA: Insufficient documentation

## 2014-07-20 DIAGNOSIS — F419 Anxiety disorder, unspecified: Secondary | ICD-10-CM | POA: Diagnosis not present

## 2014-07-20 DIAGNOSIS — M25511 Pain in right shoulder: Secondary | ICD-10-CM | POA: Diagnosis not present

## 2014-07-20 DIAGNOSIS — D649 Anemia, unspecified: Secondary | ICD-10-CM | POA: Diagnosis not present

## 2014-07-20 DIAGNOSIS — Y999 Unspecified external cause status: Secondary | ICD-10-CM | POA: Diagnosis not present

## 2014-07-20 DIAGNOSIS — S3992XA Unspecified injury of lower back, initial encounter: Secondary | ICD-10-CM | POA: Insufficient documentation

## 2014-07-20 DIAGNOSIS — R0789 Other chest pain: Secondary | ICD-10-CM | POA: Diagnosis not present

## 2014-07-20 DIAGNOSIS — S199XXA Unspecified injury of neck, initial encounter: Secondary | ICD-10-CM | POA: Diagnosis not present

## 2014-07-20 MED ORDER — MORPHINE SULFATE 4 MG/ML IJ SOLN: 4.0000 mg | Freq: Once | INTRAMUSCULAR | Status: DC

## 2014-07-20 MED ORDER — HYDROCODONE-ACETAMINOPHEN 5-325 MG PO TABS: 1.0000 | ORAL_TABLET | ORAL | Status: DC | PRN

## 2014-07-20 MED ORDER — HYDROMORPHONE HCL 1 MG/ML IJ SOLN
1.0000 mg | Freq: Once | INTRAMUSCULAR | Status: AC
  Administered 2014-07-20: 1 mg via INTRAMUSCULAR
  Filled 2014-07-20: qty 1

## 2014-07-20 MED ORDER — OXYCODONE-ACETAMINOPHEN 5-325 MG PO TABS
1.0000 | ORAL_TABLET | Freq: Once | ORAL | Status: AC
  Administered 2014-07-20: 1 via ORAL
  Filled 2014-07-20: qty 1

## 2014-07-20 NOTE — Discharge Instructions (Signed)

## 2014-07-20 NOTE — ED Notes (Signed)
Pt asking for more pain medication, pt updated on POC. MD to be notified about her pain.
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
## 2014-08-15 ENCOUNTER — Other Ambulatory Visit: Payer: Self-pay | Admitting: Internal Medicine

## 2014-08-15 DIAGNOSIS — N63 Unspecified lump in unspecified breast: Secondary | ICD-10-CM

## 2014-08-15 DIAGNOSIS — N644 Mastodynia: Secondary | ICD-10-CM

## 2014-08-15 DIAGNOSIS — T148XXA Other injury of unspecified body region, initial encounter: Secondary | ICD-10-CM

## 2014-08-20 ENCOUNTER — Ambulatory Visit
Admission: RE | Admit: 2014-08-20 | Discharge: 2014-08-20 | Disposition: A | Discharge status: Home / Self Care | Payer: Commercial Managed Care - HMO | Source: Ambulatory Visit | Attending: Internal Medicine | Admitting: Internal Medicine

## 2014-08-20 DIAGNOSIS — N644 Mastodynia: Secondary | ICD-10-CM

## 2014-08-20 DIAGNOSIS — N63 Unspecified lump in unspecified breast: Secondary | ICD-10-CM

## 2014-08-20 DIAGNOSIS — T148XXA Other injury of unspecified body region, initial encounter: Secondary | ICD-10-CM

## 2014-08-24 IMAGING — US US OB TRANSVAGINAL
1 series · 14 of 14 positions shown · non-contrast
Comparison: none

CLINICAL DATA: Assigned gestational age of 23 weeks 2 days by LMP,
presenting with vaginal spotting, cramping, possible preterm level,
and short cervix on physical examination.

LIMITED OBSTETRIC ULTRASOUND WITH TRANSVAGINAL
Number of Fetuses: 1
Heart Rate: 144 bpm
Movement: Visualized.
Presentation: Breech.
Placental Location: Anterior.
MATERNAL FINDINGS:
Cervix: 3.0 cm measured transvaginally, closed.  This did not
change with Valsalva.

[Series 1: us ob transvaginal · 14 acquisitions, 14 frames shown]
[im 1/14]
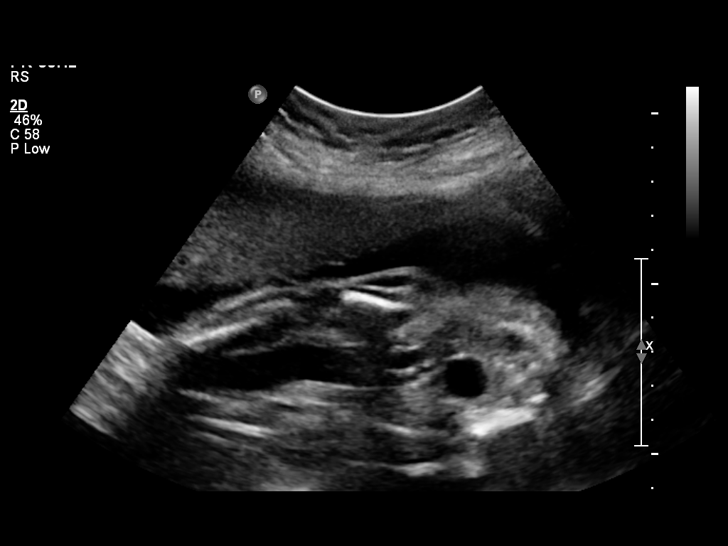
[im 2/14]
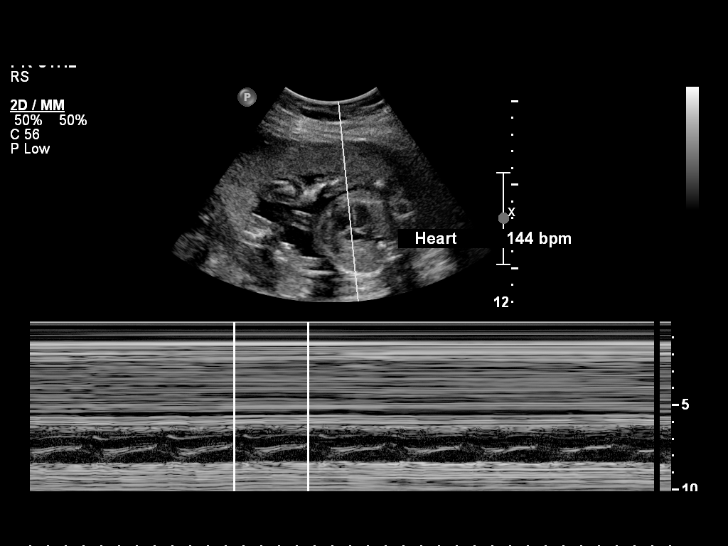
[im 3/14]
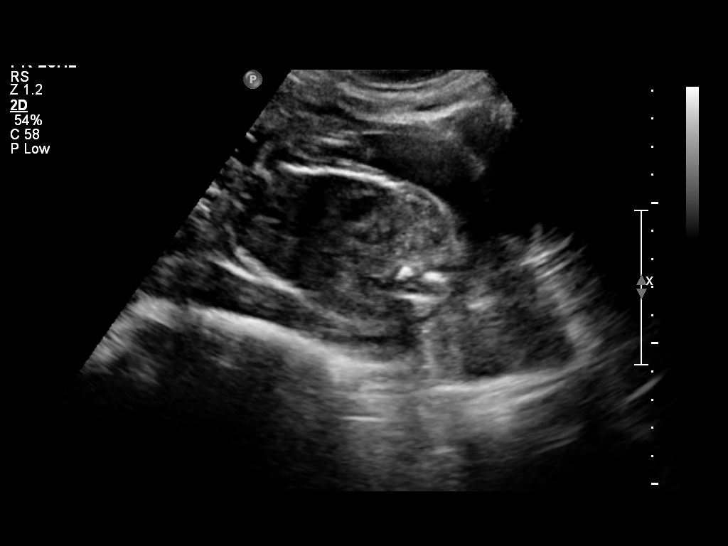
[im 4/14]
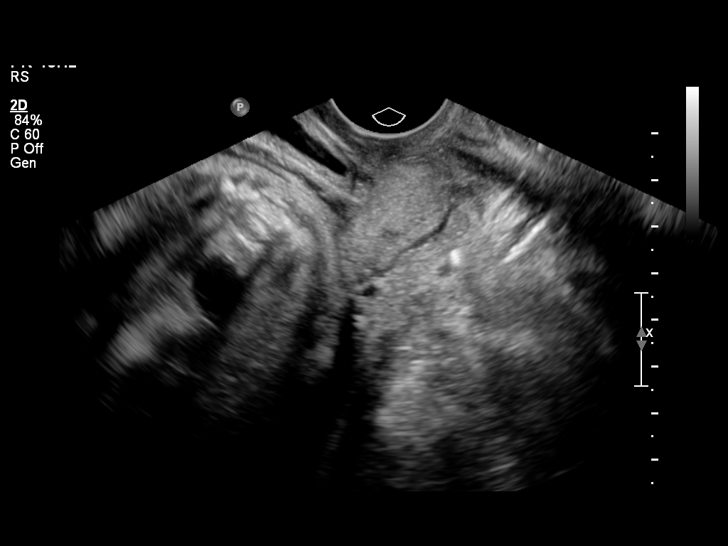
[im 5/14]
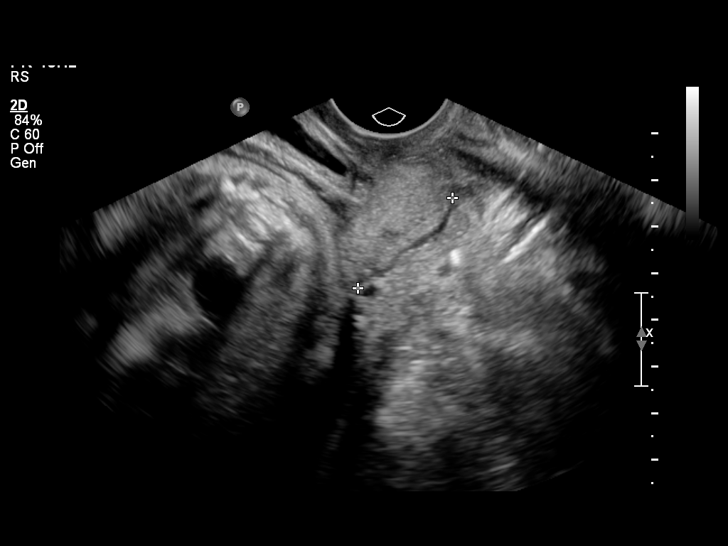
[im 6/14]
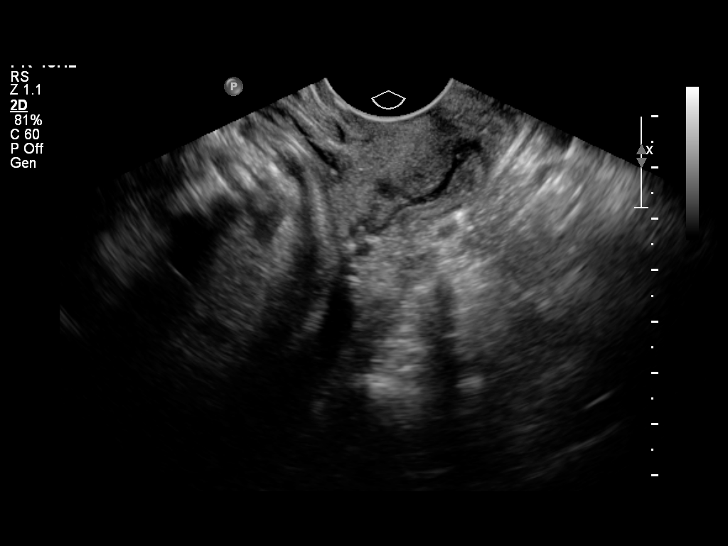
[im 7/14]
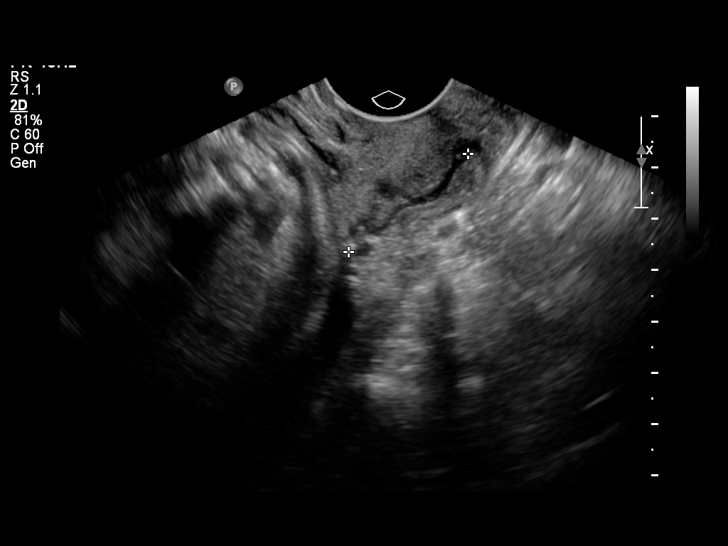
[im 8/14]
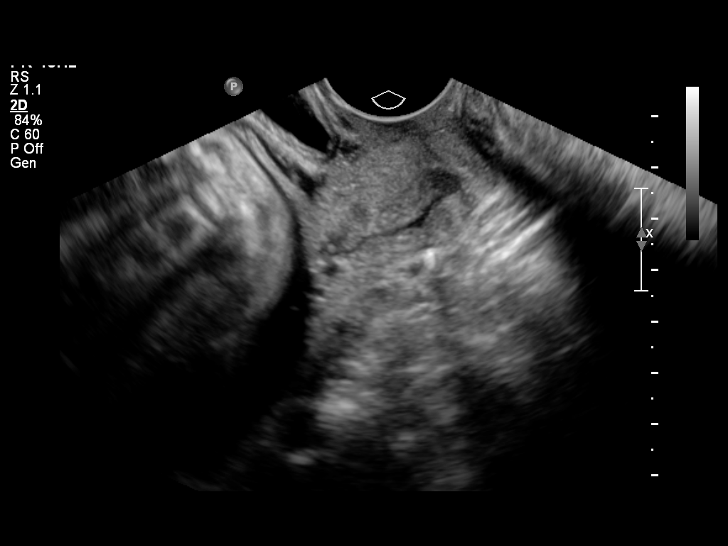
[im 9/14]
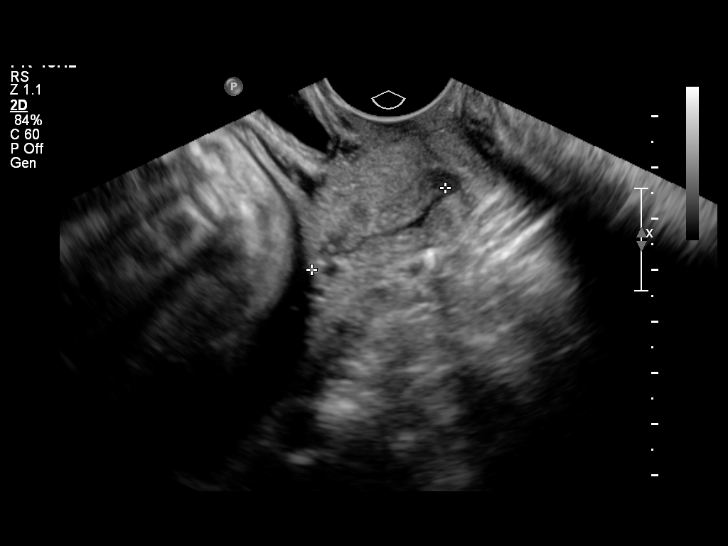
[im 10/14]
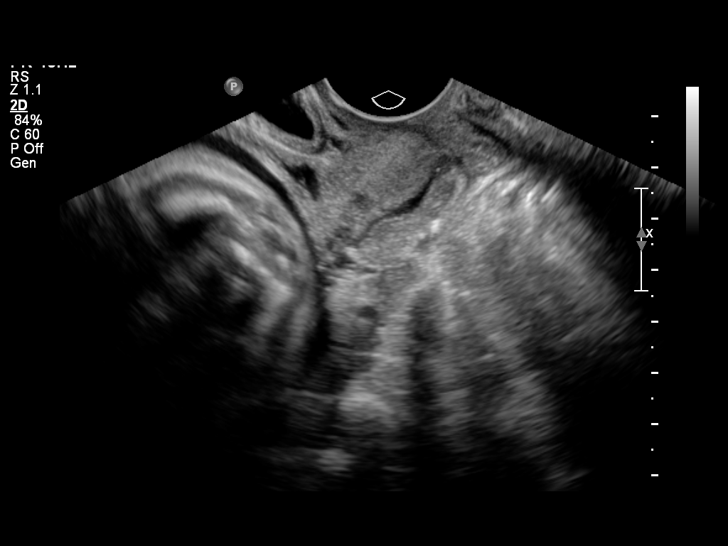
[im 11/14]
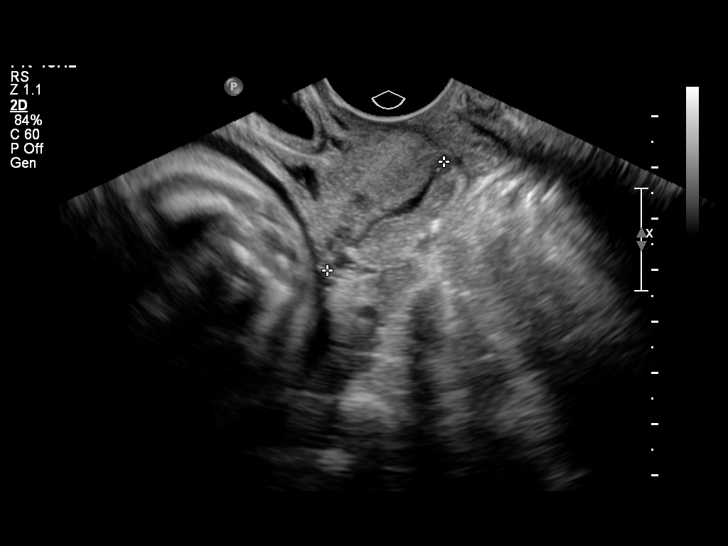
[im 12/14]
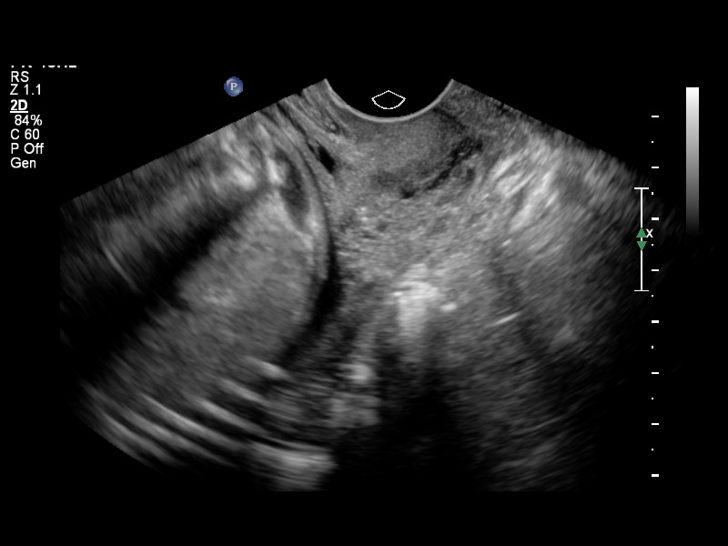
[im 13/14]
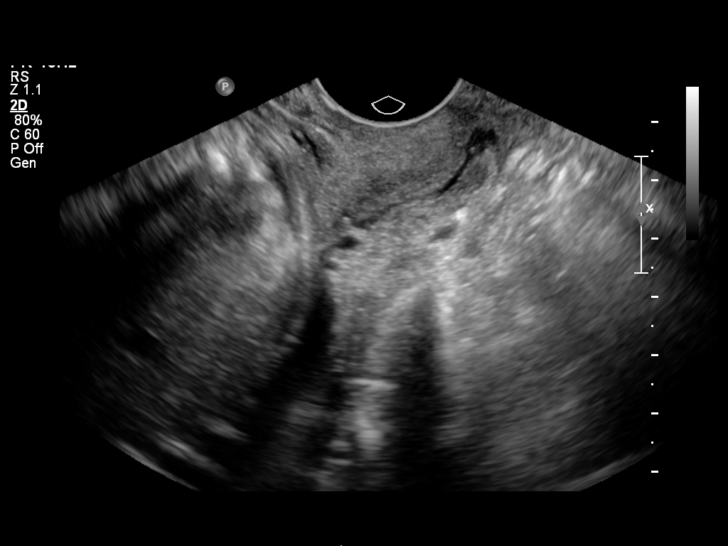
[im 14/14]
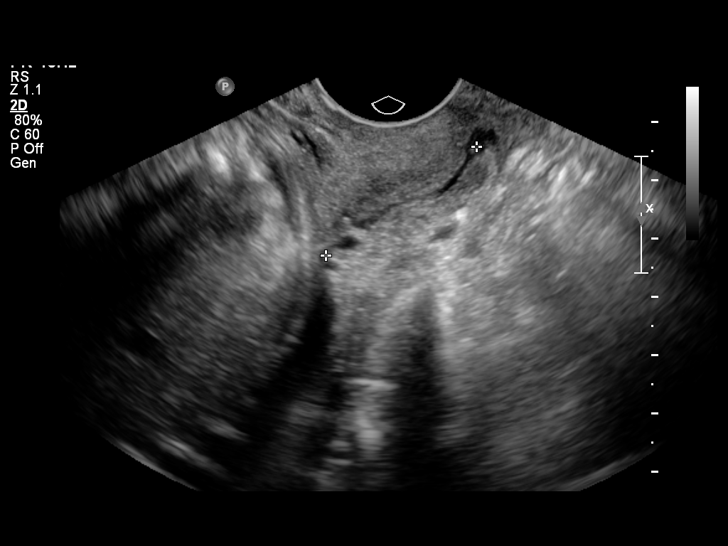

[14 of 14 positions shown; findings below may reference images not displayed]

IMPRESSION: 1.  Single live intrauterine fetus in breech presentation.
2.  Cervix measures 3.0 cm transvaginally and is stable with
Valsalva.

Recommend followup with non-emergent complete OB 14+ wk US
examination for fetal biometric evaluation and anatomic survey if
not already performed.
IMPRESSION:

## 2014-09-17 IMAGING — US US OB FOLLOW-UP
1 series · 12 of 28 positions shown · non-contrast
Comparison: none

[Series 1: us ob follow up · 49 acquisitions, 12 frames shown]
[im 2/49]
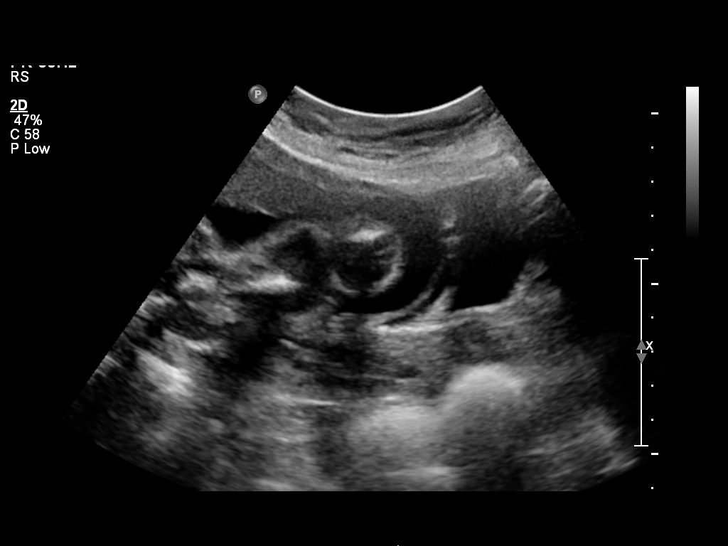
[im 6/49]
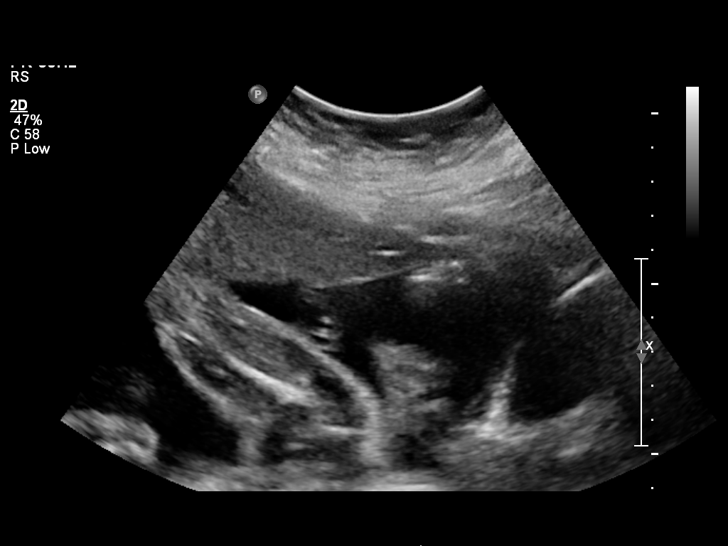
[im 9/49]
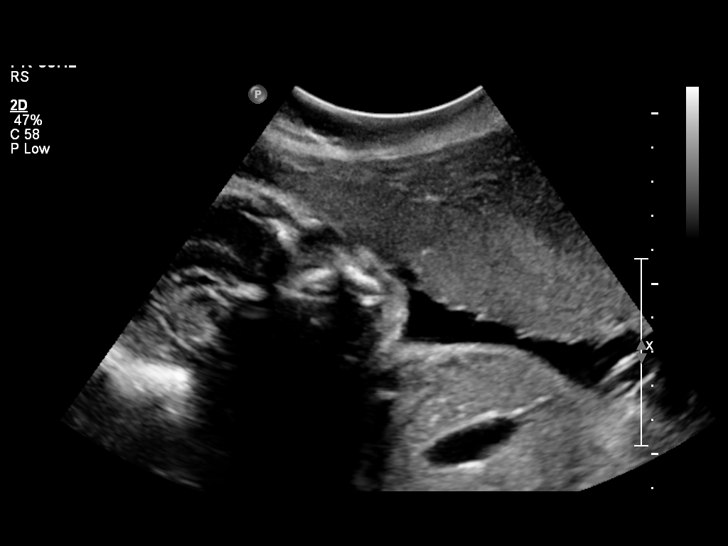
[im 15/49]
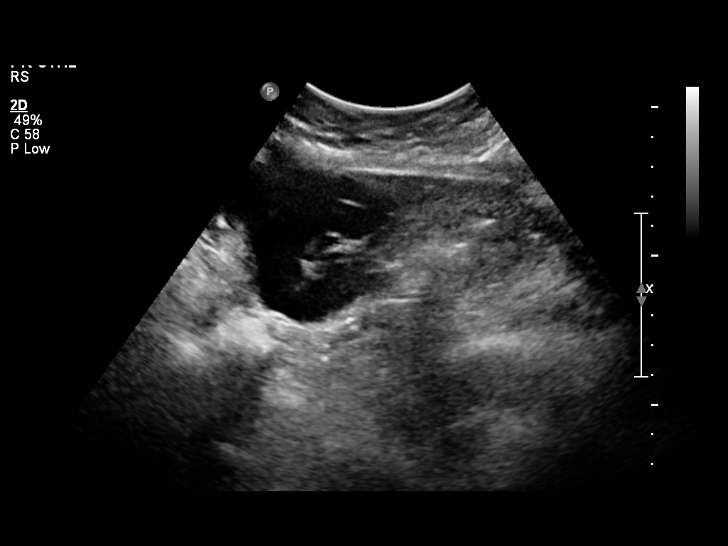
[im 18/49]
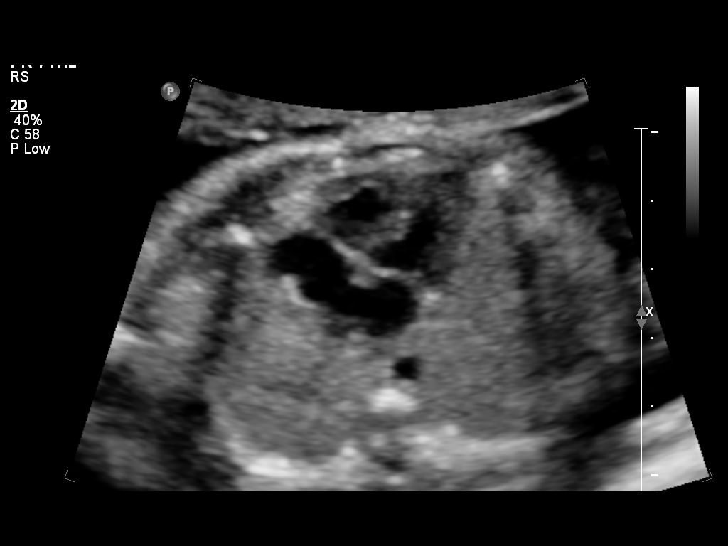
[im 22/49]
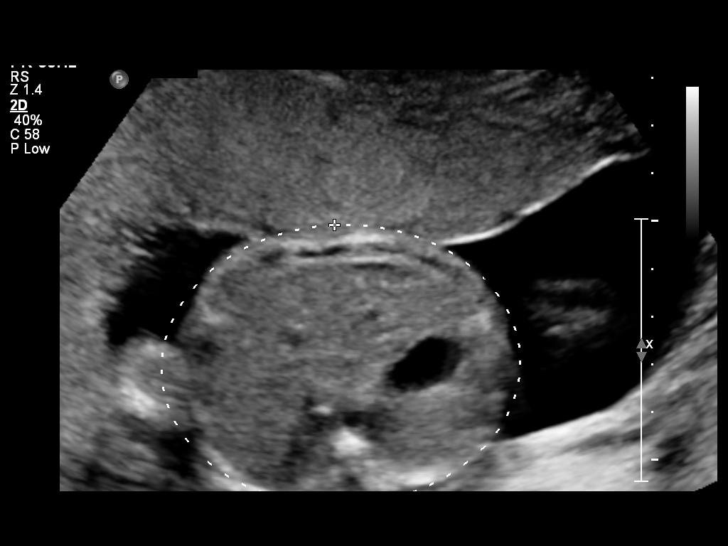
[im 27/49]
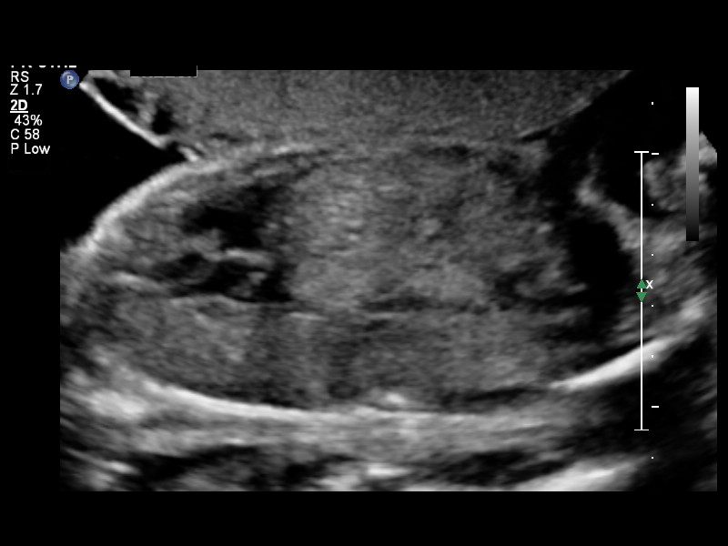
[im 31/49]
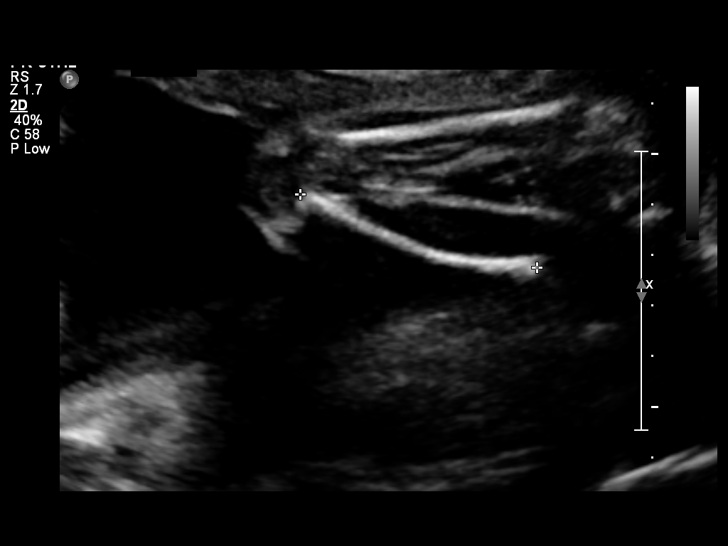
[im 34/49]
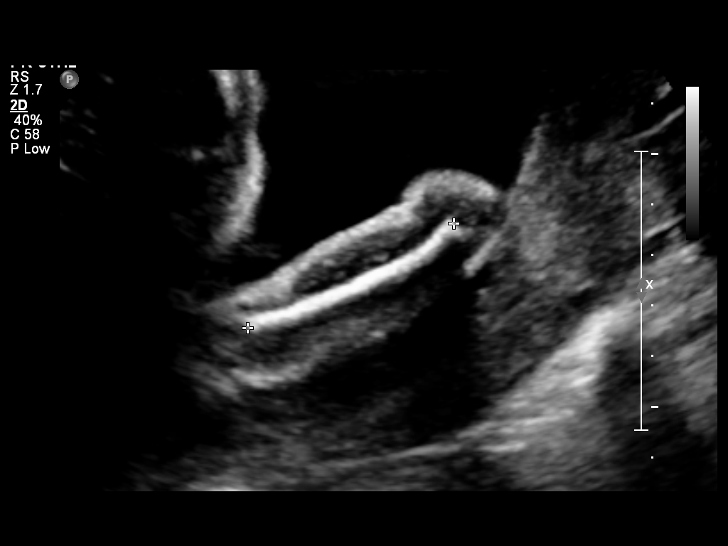
[im 40/49]
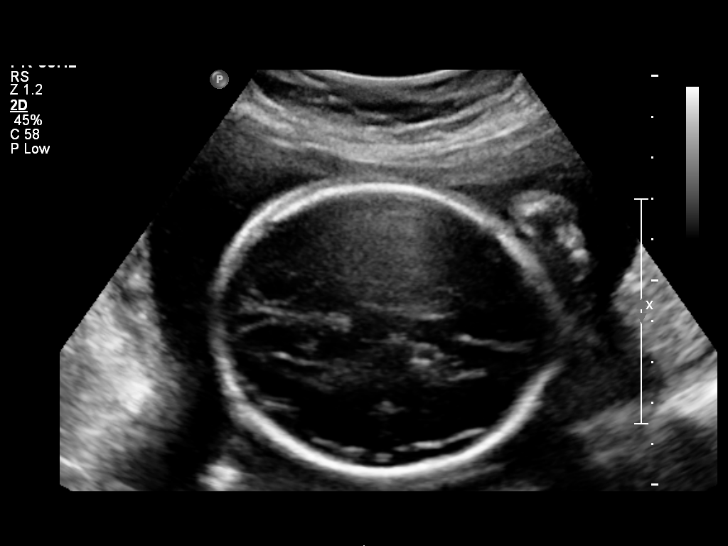
[im 43/49]
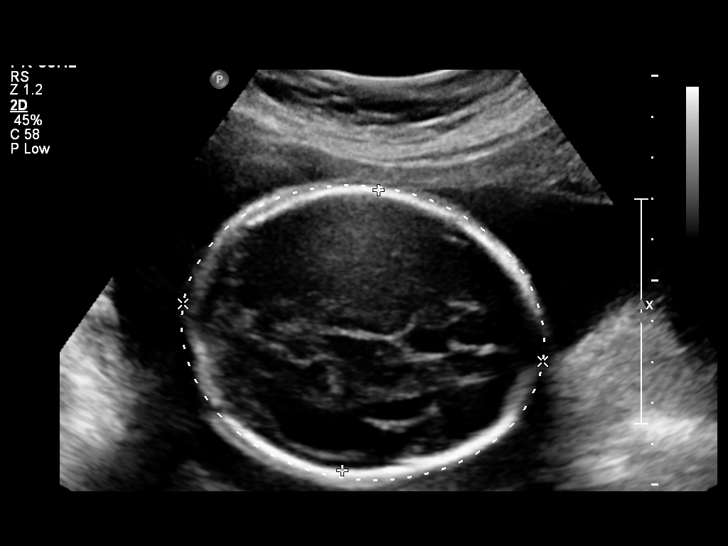
[im 47/49]
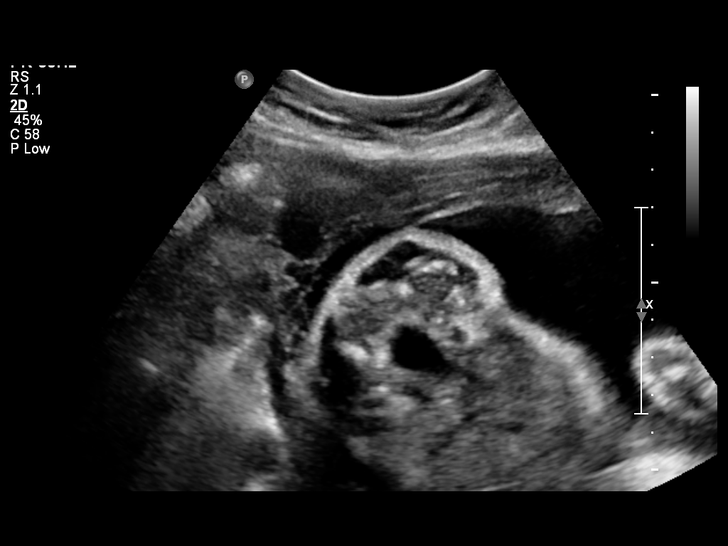

[12 of 28 positions shown; findings below may reference images not displayed]

OBSTETRICS REPORT
                      (Signed Final 08/02/2012 [DATE])

Service(s) Provided

 US OB FOLLOW UP                                       76816.1
Indications

 Previous cesarean section x 3
 Follow-up incomplete fetal anatomic evaluation
 Advanced maternal age (AMA), Multigravida
 No or Little Prenatal Care
Fetal Evaluation

 Num Of Fetuses:    1
 Fetal Heart Rate:  147                         bpm
 Cardiac Activity:  Observed
 Presentation:      Breech
 Placenta:          Anterior, above cervical os
 P. Cord            Previously Visualized
 Insertion:

 Amniotic Fluid
 AFI FV:      Subjectively within normal limits
                                             Larg Pckt:     6.1  cm
Biometry

 BPD:     68.9  mm    G. Age:   27w 5d                CI:        75.42   70 - 86
                                                      FL/HC:      19.4   18.6 -

 HC:     251.6  mm    G. Age:   27w 3d       44  %    HC/AC:      1.18   1.05 -

 AC:     213.8  mm    G. Age:   25w 6d       20  %    FL/BPD:     70.8   71 - 87
 FL:      48.8  mm    G. Age:   26w 3d       27  %    FL/AC:      22.8   20 - 24
 HUM:     45.7  mm    G. Age:   27w 0d       53  %
 Est. FW:     919  gm          2 lb      45  %
Gestational Age

 LMP:           26w 5d       Date:   01/28/12                 EDD:   11/03/12
 U/S Today:     26w 6d                                        EDD:   11/02/12
 Best:          26w 5d    Det. By:   LMP  (01/28/12)          EDD:   11/03/12
Anatomy

 Cranium:          Appears normal         Aortic Arch:      Not well visualized
 Fetal Cavum:      Previously seen        Ductal Arch:      Appears normal
 Ventricles:       Appears normal         Diaphragm:        Appears normal
 Choroid Plexus:   Previously seen        Stomach:          Appears normal
 Cerebellum:       Previously seen        Abdomen:          Appears normal
 Posterior Fossa:  Previously seen        Abdominal Wall:   Previously seen
 Nuchal Fold:      Previously seen        Cord Vessels:     Previously seen
 Face:             Orbits previously      Kidneys:          Appear normal
                   seen
 Lips:             Previously seen        Bladder:          Appears normal
 Heart:            Appears normal         Spine:            Previously seen
                   (4CH, axis, and
                   situs)
 RVOT:             Previously seen        Lower             Previously seen
                                          Extremities:
 LVOT:             Previously seen        Upper             Previously seen
                                          Extremities:

 Other:  Male gender previously seen. Heels and Rt. 5th digit previously
         visualized. Technically difficult due to fetal position.
Targeted Anatomy

 Fetal Central Nervous System
 Lat. Ventricles:
Cervix Uterus Adnexa

 Cervical Length:   3.12      cm

 Cervix:       Normal appearance by transabdominal scan.

 Left Ovary:   Not visualized.
 Right Ovary:  Not visualized.
 Adnexa:     No abnormality visualized.
Impression

 Single living IUP with assigned GA of 26w 5d. Appropriate
 fetal growth.
 Normal amniotic fluid volume and cervical length.
 The ductal arch is visualized on today's study and appears
 normal.
 No late developing fetal anatomic abnormality is identified.
 Normal amniotic fluid volume and cervical length.

 questions or concerns.

## 2014-11-18 IMAGING — US US OB FOLLOW-UP
1 series · 12 of 28 positions shown · non-contrast
Comparison: none

[Series 1: us ob follow-up · 0.28mm/px · 12 of 37 slices shown]
[im 2/37]
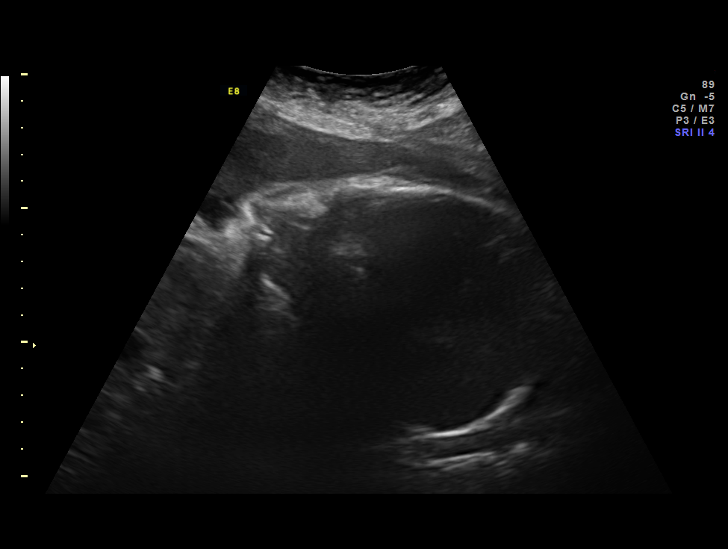
[im 5/37]
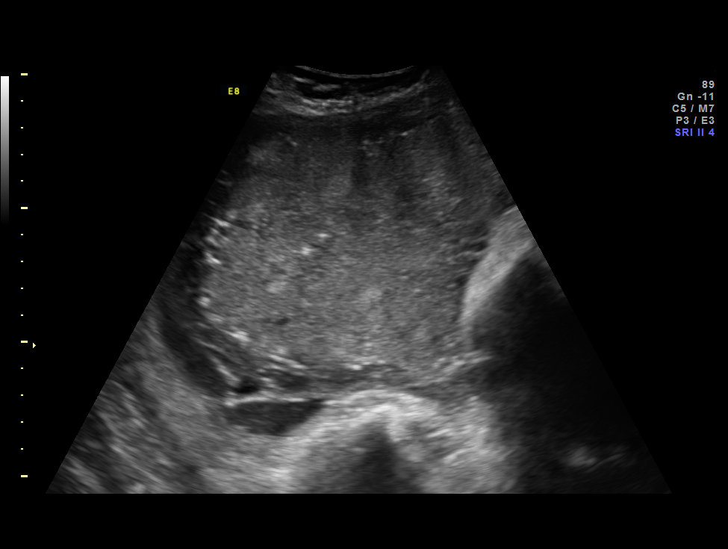
[im 7/37]
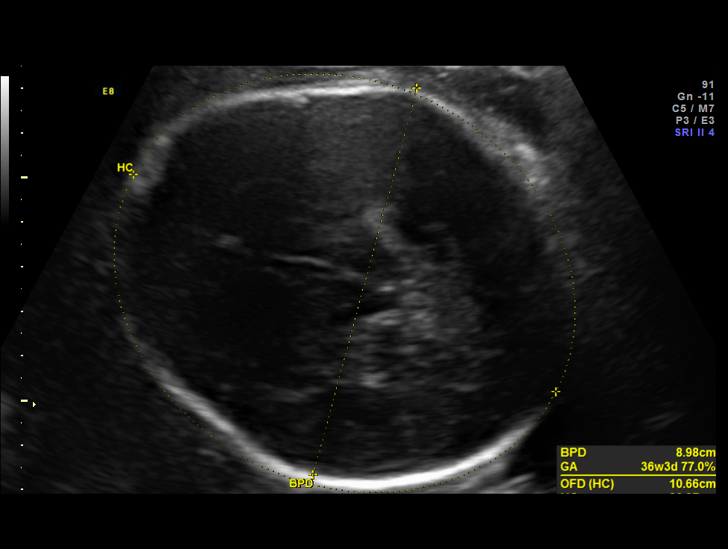
[im 11/37]
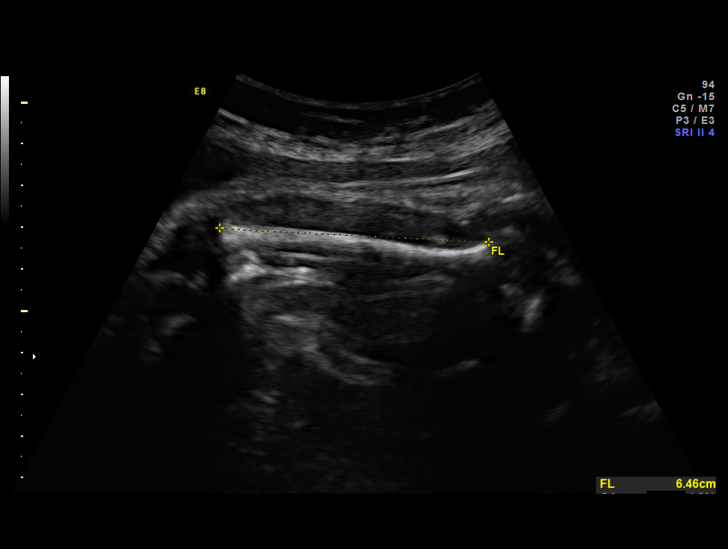
[im 14/37]
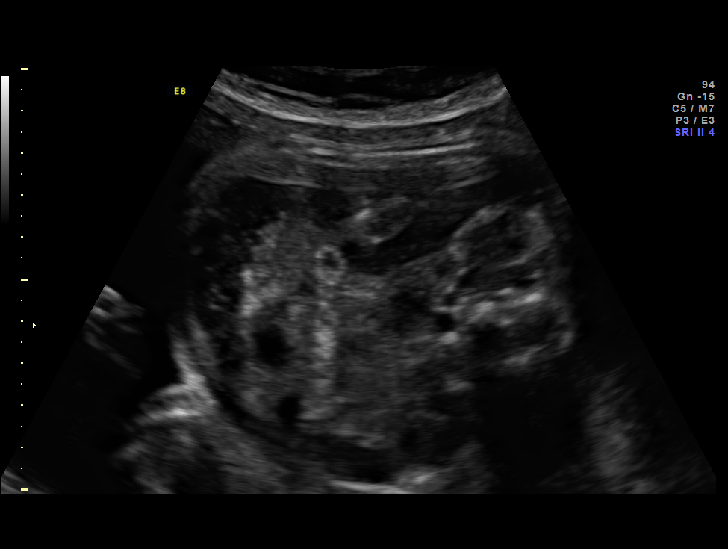
[im 17/37]
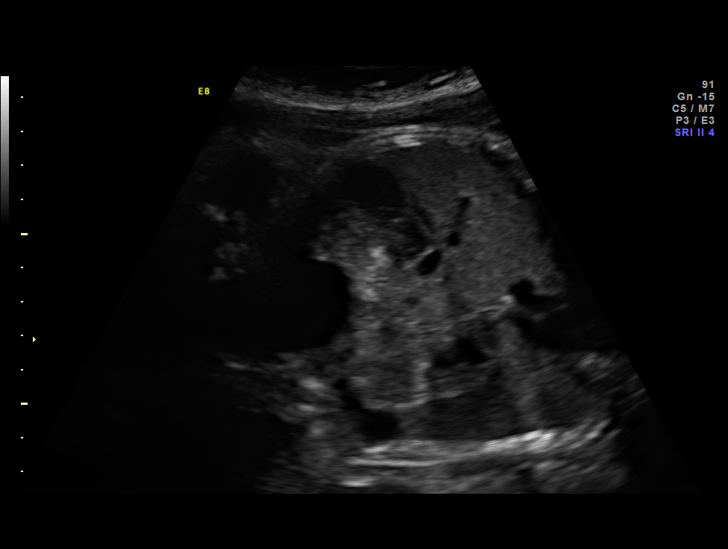
[im 21/37]
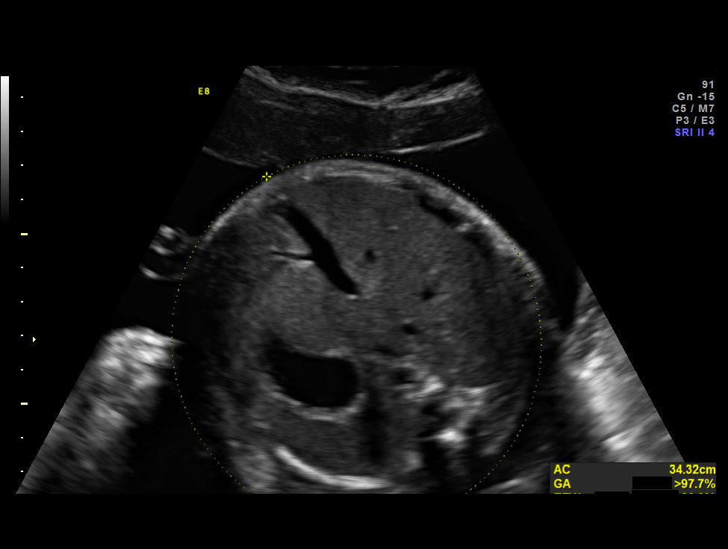
[im 23/37]
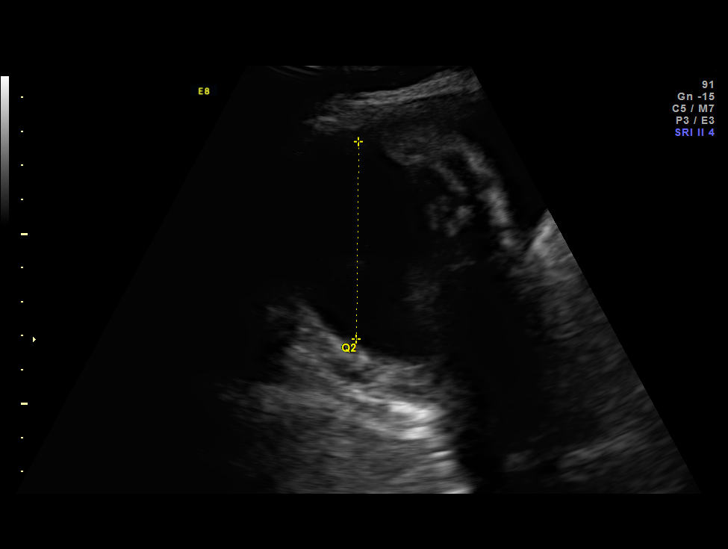
[im 26/37]
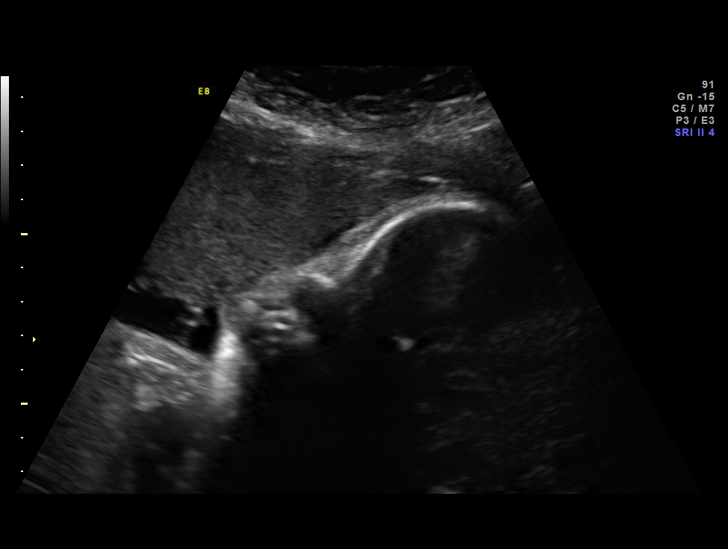
[im 30/37]
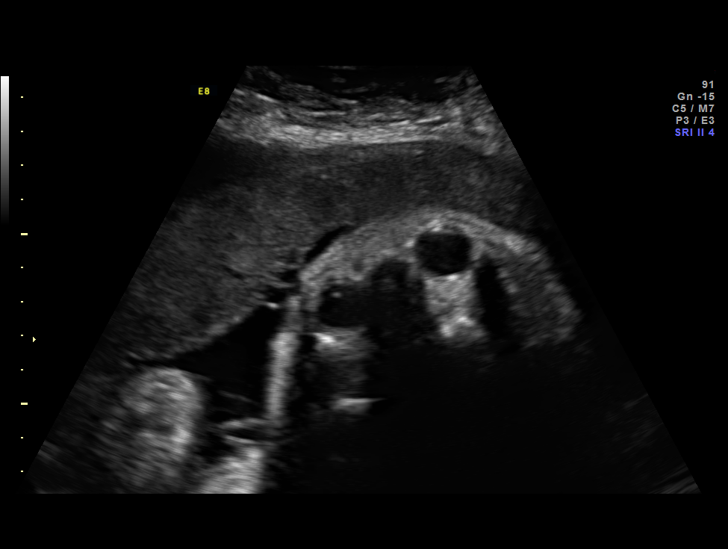
[im 33/37]
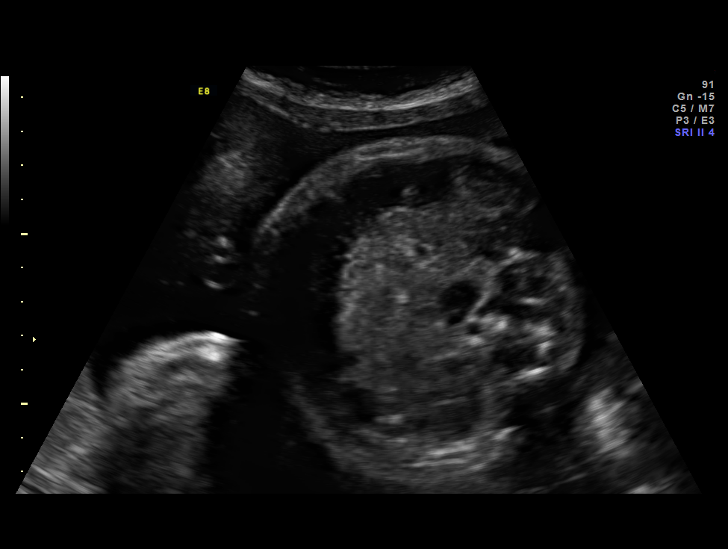
[im 35/37]
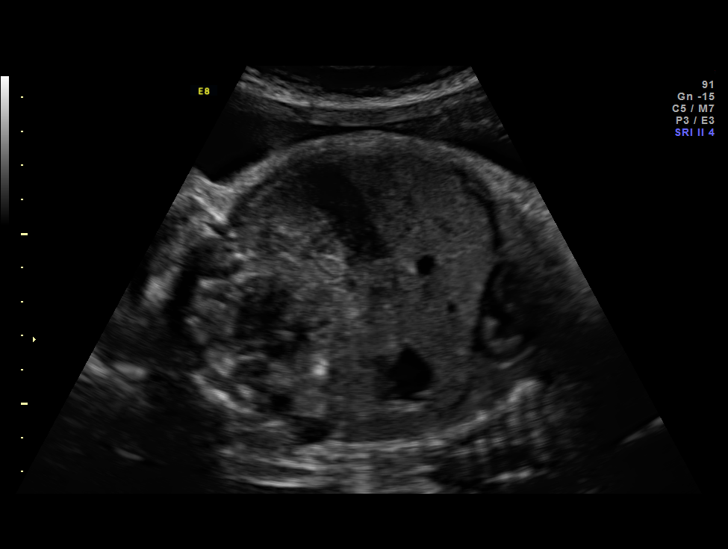

[12 of 28 positions shown; findings below may reference images not displayed]

OBSTETRICS REPORT
                      (Signed Final 10/03/2012 [DATE])

Service(s) Provided

 US OB FOLLOW UP                                       76816.1
Indications

 Previous cesarean section x 3
 Advanced maternal age (AMA), Multigravida
 No or Little Prenatal Care
 Size greater than dates (Large for gestational [AGE]
Fetal Evaluation

 Num Of Fetuses:    1
 Fetal Heart Rate:  144                          bpm
 Cardiac Activity:  Observed
 Presentation:      Cephalic
 Placenta:          Anterior, above cervical os
 P. Cord            Previously Visualized
 Insertion:

 Amniotic Fluid
 AFI FV:      Subjectively within normal limits
 AFI Sum:     17.67   cm       65  %Tile     Larg Pckt:    5.81  cm
 RUQ:   3.86    cm   RLQ:    4.67   cm    LUQ:   5.81    cm   LLQ:    3.33   cm
Biometry

 BPD:       90  mm     G. Age:  36w 4d                CI:         83.4   70 - 86
 OFD:    107.9  mm                                    FL/HC:      20.8   20.1 -

 HC:     313.3  mm     G. Age:  35w 1d       12  %    HC/AC:      0.92   0.93 -

 AC:     341.4  mm     G. Age:  38w 0d     > 97  %    FL/BPD:     72.4   71 - 87
 FL:      65.2  mm     G. Age:  33w 4d        7  %    FL/AC:      19.1   20 - 24
 HUM:     56.3  mm     G. Age:  32w 5d       12  %
 Est. FW:    0608  gm      6 lb 8 oz     79  %
Gestational Age

 LMP:           35w 4d        Date:  01/28/12                 EDD:   11/03/12
 U/S Today:     35w 6d                                        EDD:   11/01/12
 Best:          35w 4d     Det. By:  LMP  (01/28/12)          EDD:   11/03/12
Anatomy

 Cranium:          Previously seen        Aortic Arch:      Not well visualized
 Fetal Cavum:      Previously seen        Ductal Arch:      Previously seen
 Ventricles:       Previously seen        Diaphragm:        Appears normal
 Choroid Plexus:   Previously seen        Stomach:          Appears normal, left
                                                            sided
 Cerebellum:       Previously seen        Abdomen:          Previously seen
 Posterior Fossa:  Previously seen        Abdominal Wall:   Previously seen
 Nuchal Fold:      Previously seen        Cord Vessels:     Previously seen
 Face:             Appears normal         Kidneys:          Appear normal
                   (orbits and profile)
 Lips:             Appears normal         Bladder:          Appears normal
 Heart:            Appears normal         Spine:            Previously seen
                   (4CH, axis, and
                   situs)
 RVOT:             Previously seen        Lower             Previously seen
                                          Extremities:
 LVOT:             Previously seen        Upper             Previously seen
                                          Extremities:

 Other:  Male gender previously seen. Heels and Rt. 5th digit previously
         visualized.
Cervix Uterus Adnexa

 Cervix:       Not visualized (advanced GA >27wks)
Impression

 Single IUP at 35 [DATE] weeks
 Fetal growth is appropriate (79th %tile)
 The fetal bowel appears somewhat prominent, but no dilated
 loops of bowel were appreciated; likely of no clinical
 consequence
 Normal amniotic fluid volume
Recommendations

 Recommend 2x weekly NSTs due to advanced maternal age
 > 40
 Recommend delivery by EDD but not prior to 39 weeks in the
 absence of other complications

 questions or concerns.

## 2015-04-12 ENCOUNTER — Emergency Department (HOSPITAL_COMMUNITY)
Admission: EM | Admit: 2015-04-12 | Discharge: 2015-04-12 | Disposition: A | Discharge status: Home / Self Care | Payer: Commercial Managed Care - HMO | Attending: Emergency Medicine | Admitting: Emergency Medicine

## 2015-04-12 ENCOUNTER — Encounter (HOSPITAL_COMMUNITY): Payer: Self-pay | Admitting: Emergency Medicine

## 2015-04-12 DIAGNOSIS — M797 Fibromyalgia: Secondary | ICD-10-CM | POA: Diagnosis not present

## 2015-04-12 DIAGNOSIS — G8929 Other chronic pain: Secondary | ICD-10-CM | POA: Diagnosis not present

## 2015-04-12 DIAGNOSIS — D649 Anemia, unspecified: Secondary | ICD-10-CM | POA: Insufficient documentation

## 2015-04-12 DIAGNOSIS — Z79899 Other long term (current) drug therapy: Secondary | ICD-10-CM | POA: Insufficient documentation

## 2015-04-12 DIAGNOSIS — G43909 Migraine, unspecified, not intractable, without status migrainosus: Secondary | ICD-10-CM | POA: Diagnosis not present

## 2015-04-12 DIAGNOSIS — K0889 Other specified disorders of teeth and supporting structures: Secondary | ICD-10-CM

## 2015-04-12 DIAGNOSIS — M1990 Unspecified osteoarthritis, unspecified site: Secondary | ICD-10-CM | POA: Diagnosis not present

## 2015-04-12 DIAGNOSIS — F419 Anxiety disorder, unspecified: Secondary | ICD-10-CM | POA: Insufficient documentation

## 2015-04-12 DIAGNOSIS — Z87891 Personal history of nicotine dependence: Secondary | ICD-10-CM | POA: Insufficient documentation

## 2015-04-12 HISTORY — DX: Fibromyalgia: M79.7

## 2015-04-12 MED ORDER — OXYCODONE-ACETAMINOPHEN 5-325 MG PO TABS
1.0000 | ORAL_TABLET | Freq: Once | ORAL | Status: AC
  Administered 2015-04-12: 1 via ORAL
  Filled 2015-04-12: qty 1

## 2015-04-12 MED ORDER — IBUPROFEN 800 MG PO TABS: 800.0000 mg | ORAL_TABLET | Freq: Three times a day (TID) | ORAL | Status: DC | PRN

## 2015-04-12 MED ORDER — PENICILLIN V POTASSIUM 500 MG PO TABS: 500.0000 mg | ORAL_TABLET | Freq: Four times a day (QID) | ORAL | Status: DC

## 2015-04-12 MED ORDER — KETOROLAC TROMETHAMINE 60 MG/2ML IM SOLN
60.0000 mg | Freq: Once | INTRAMUSCULAR | Status: AC
  Administered 2015-04-12: 60 mg via INTRAMUSCULAR
  Filled 2015-04-12: qty 2

## 2015-04-12 MED ORDER — OXYCODONE-ACETAMINOPHEN 5-325 MG PO TABS: 1.0000 | ORAL_TABLET | Freq: Four times a day (QID) | ORAL | Status: DC | PRN

## 2015-04-12 NOTE — ED Notes (Signed)
Patient able to ambulate independently  

## 2015-04-12 NOTE — ED Provider Notes (Signed)
CSN: [id number]     Arrival date & time 04/12/15  1906 History   First MD Initiated Contact with Patient 04/12/15 1934     Chief Complaint  Patient presents with  . Dental Pain     (Consider location/radiation/quality/duration/timing/severity/associated sxs/prior Treatment) HPI Patient presents to the emergency department with right upper dental pain.  The patient states that started 2 days ago.  The patient states that nothing seems make her condition better.  She states that palpation makes the pain worse.  Patient states that she did not take any medications prior to arrival.  Patient denies nausea, vomiting, weakness, dizziness, headache, blurred vision, back pain, neck swelling, difficulty swallowing, difficulty breathing, or syncope.  She also states she has not had any fevers Past Medical History  Diagnosis Date  . Chronic pain   . Bulging lumbar disc   . Abnormal Pap smear   . Anxiety     Takes xanax  . Arthritis     Has rx for percocet  . Migraines   . Anemia   . Fibromyalgia    Past Surgical History  Procedure Laterality Date  . Cesarean section      X 3  . Cervical cone biopsy    . Gastric bypass  2007  . Cesarean section with bilateral tubal ligation Bilateral 10/28/2012    Procedure: Repeat cesarean section with delivery of baby boy at 24. Apgars 8/9.  BILATERAL TUBAL LIGATION;  Surgeon: Florian Buff, MD;  Location: [facility];  Service: Obstetrics;  Laterality: Bilateral;  . Gastric bypass     Family History  Problem Relation Age of Onset  . Diabetes Father   . Depression Maternal Grandmother   . Heart disease Maternal Grandfather   . Depression Paternal Grandmother    Social History  Substance Use Topics  . Smoking status: Former Smoker -- 0.50 packs/day for 10 years    Types: Cigarettes    Quit date: 03/16/2012  . Smokeless tobacco: Never Used  . Alcohol Use: 0.0 oz/week     Comment: socially   OB History    Gravida Para Term Preterm AB TAB SAB  Ectopic Multiple Living   _0 0 _1 Review of Systems  All other systems negative except as documented in the HPI. All pertinent positives and negatives as reviewed in the HPI.  Allergies  Review of patient's allergies indicates no active allergies.  Home Medications   Prior to Admission medications   Medication Sig Start Date End Date Taking? Authorizing Provider  ALPRAZolam Duanne Moron) 1 MG tablet  04/28/13   Historical Provider, MD  buPROPion (WELLBUTRIN XL) 150 MG 24 hr tablet  03/01/13   Historical Provider, MD  Cyanocobalamin (VITAMIN B12 PO) Take 1 tablet by mouth daily.    Historical Provider, MD  ferrous sulfate 325 (65 FE) MG tablet Take 325 mg by mouth 3 (three) times daily.     Historical Provider, MD  gabapentin (NEURONTIN) 300 MG capsule Take 1 capsule (300 mg total) by mouth 3 (three) times daily. 07/06/13   Charlett Blake, MD  HYDROcodone-acetaminophen (NORCO/VICODIN) 5-325 MG per tablet Take 1 tablet by mouth every 4 (four) hours as needed. 07/20/14   Jarome Matin, MD  lidocaine (XYLOCAINE) 4 % external solution Apply topically 3 (three) times daily as needed. 10/01/13   Charlett Blake, MD  lidocaine (XYLOCAINE) 5 % ointment  10/25/13   Historical Provider, MD  [place]  MEDICATION Take 1 tablet by mouth daily. Biotin    Historical Provider, MD  oxyCODONE-acetaminophen (PERCOCET) 10-325 MG per tablet Take 1 tablet by mouth 2 (two) times daily as needed for pain. 12/17/13   Charlett Blake, MD  PredniSONE 10 MG KIT 12 day dose pack po 02/14/14   Gregor Hams, MD  TRI-SPRINTEC 0.18/0.215/0.25 MG-35 MCG tablet  08/29/13   Historical Provider, MD   BP 128/84 mmHg  Pulse 75  Temp(Src) 98.3 F (36.8 C) (Oral)  Resp 18  Wt 62.852 kg  SpO2 100%  LMP 03/11/2015 Physical Exam  Constitutional: She is oriented to person, place, and time. She appears well-developed and well-nourished. No distress.  HENT:  Head: Normocephalic and atraumatic.  Mouth/Throat:  Oropharynx is clear and moist.    Cardiovascular: Normal rate, regular rhythm and normal heart sounds.  Exam reveals no gallop and no friction rub.   No murmur heard. Pulmonary/Chest: Effort normal and breath sounds normal. No respiratory distress. She has no wheezes.  Neurological: She is alert and oriented to person, place, and time.  Skin: Skin is warm and dry. No rash noted. No erythema.  Psychiatric: She has a normal mood and affect. Her behavior is normal.  Nursing note and vitals reviewed.   ED Course  Procedures (including critical care time) Labs Review Labs Reviewed - No data to display  Patient is advised return here as needed.  She is referred to dentistry.  Told to rinse with warm water and peroxide 3 times a day.    Dalia Heading, PA-C 04/13/15 2326  Carmin Muskrat, MD 04/13/15 [phone]

## 2015-04-12 NOTE — ED Notes (Signed)
Pt presents to ED for assessment of right upper dental pain.  Denies difficulty swallowing or shortness of breath.

## 2015-04-12 NOTE — Discharge Instructions (Signed)
Return here as needed.  Follow-up with your dentist.  Rinse with warm water and peroxide 3 times a day

## 2015-04-27 DIAGNOSIS — E739 Lactose intolerance, unspecified: Secondary | ICD-10-CM

## 2015-04-27 HISTORY — DX: Lactose intolerance, unspecified: E73.9

## 2016-02-01 ENCOUNTER — Encounter (HOSPITAL_COMMUNITY): Payer: Self-pay | Admitting: *Deleted

## 2016-02-01 ENCOUNTER — Inpatient Hospital Stay (HOSPITAL_COMMUNITY)
Admission: EM | Admit: 2016-02-01 | Discharge: 2016-02-05 | DRG: 682 | Disposition: A | Discharge status: Home Health | Payer: Medicare Other | Attending: Family Medicine | Admitting: Family Medicine

## 2016-02-01 DIAGNOSIS — Z79891 Long term (current) use of opiate analgesic: Secondary | ICD-10-CM | POA: Diagnosis not present

## 2016-02-01 DIAGNOSIS — Z833 Family history of diabetes mellitus: Secondary | ICD-10-CM | POA: Diagnosis not present

## 2016-02-01 DIAGNOSIS — T502X5A Adverse effect of carbonic-anhydrase inhibitors, benzothiadiazides and other diuretics, initial encounter: Secondary | ICD-10-CM | POA: Diagnosis present

## 2016-02-01 DIAGNOSIS — R1013 Epigastric pain: Secondary | ICD-10-CM | POA: Diagnosis present

## 2016-02-01 DIAGNOSIS — Z8249 Family history of ischemic heart disease and other diseases of the circulatory system: Secondary | ICD-10-CM | POA: Diagnosis not present

## 2016-02-01 DIAGNOSIS — D508 Other iron deficiency anemias: Secondary | ICD-10-CM | POA: Diagnosis not present

## 2016-02-01 DIAGNOSIS — E43 Unspecified severe protein-calorie malnutrition: Secondary | ICD-10-CM | POA: Diagnosis present

## 2016-02-01 DIAGNOSIS — M549 Dorsalgia, unspecified: Secondary | ICD-10-CM | POA: Diagnosis present

## 2016-02-01 DIAGNOSIS — E872 Acidosis: Secondary | ICD-10-CM | POA: Diagnosis present

## 2016-02-01 DIAGNOSIS — G8929 Other chronic pain: Secondary | ICD-10-CM | POA: Diagnosis present

## 2016-02-01 DIAGNOSIS — Z87891 Personal history of nicotine dependence: Secondary | ICD-10-CM

## 2016-02-01 DIAGNOSIS — E871 Hypo-osmolality and hyponatremia: Secondary | ICD-10-CM

## 2016-02-01 DIAGNOSIS — Z6823 Body mass index (BMI) 23.0-23.9, adult: Secondary | ICD-10-CM | POA: Diagnosis not present

## 2016-02-01 DIAGNOSIS — E860 Dehydration: Secondary | ICD-10-CM | POA: Diagnosis present

## 2016-02-01 DIAGNOSIS — M1990 Unspecified osteoarthritis, unspecified site: Secondary | ICD-10-CM | POA: Diagnosis present

## 2016-02-01 DIAGNOSIS — D473 Essential (hemorrhagic) thrombocythemia: Secondary | ICD-10-CM

## 2016-02-01 DIAGNOSIS — D509 Iron deficiency anemia, unspecified: Secondary | ICD-10-CM

## 2016-02-01 DIAGNOSIS — I10 Essential (primary) hypertension: Secondary | ICD-10-CM | POA: Diagnosis present

## 2016-02-01 DIAGNOSIS — I959 Hypotension, unspecified: Secondary | ICD-10-CM

## 2016-02-01 DIAGNOSIS — M797 Fibromyalgia: Secondary | ICD-10-CM | POA: Diagnosis present

## 2016-02-01 DIAGNOSIS — F419 Anxiety disorder, unspecified: Secondary | ICD-10-CM | POA: Diagnosis present

## 2016-02-01 DIAGNOSIS — Z9884 Bariatric surgery status: Secondary | ICD-10-CM

## 2016-02-01 DIAGNOSIS — E861 Hypovolemia: Secondary | ICD-10-CM | POA: Diagnosis present

## 2016-02-01 DIAGNOSIS — N179 Acute kidney failure, unspecified: Secondary | ICD-10-CM | POA: Diagnosis present

## 2016-02-01 DIAGNOSIS — D649 Anemia, unspecified: Secondary | ICD-10-CM | POA: Diagnosis present

## 2016-02-01 DIAGNOSIS — D75839 Thrombocytosis, unspecified: Secondary | ICD-10-CM

## 2016-02-01 DIAGNOSIS — R109 Unspecified abdominal pain: Secondary | ICD-10-CM

## 2016-02-01 DIAGNOSIS — R42 Dizziness and giddiness: Secondary | ICD-10-CM | POA: Diagnosis present

## 2016-02-01 LAB — URINALYSIS, ROUTINE W REFLEX MICROSCOPIC
Bilirubin Urine: NEGATIVE
Glucose, UA: NEGATIVE mg/dL
Hgb urine dipstick: NEGATIVE
Ketones, ur: NEGATIVE mg/dL
Leukocytes, UA: NEGATIVE
Nitrite: NEGATIVE
Protein, ur: NEGATIVE mg/dL
Specific Gravity, Urine: <1.005 — ABNORMAL LOW (ref 1.005–1.030)
pH: 5.5 (ref 5.0–8.0)

## 2016-02-01 LAB — ABO/RH: ABO/RH(D): B POS

## 2016-02-01 LAB — BASIC METABOLIC PANEL
Anion gap: 7 (ref 5–15)
BUN: 18 mg/dL (ref 6–20)
CO2: 19 mmol/L — ABNORMAL LOW (ref 22–32)
Calcium: 8.4 mg/dL — ABNORMAL LOW (ref 8.9–10.3)
Chloride: 105 mmol/L (ref 101–111)
Creatinine, Ser: 1.18 mg/dL — ABNORMAL HIGH (ref 0.44–1.00)
GFR calc Af Amer: >60 mL/min (ref >60)
GFR calc non Af Amer: 54 mL/min — ABNORMAL LOW (ref >60)
Glucose, Bld: 135 mg/dL — ABNORMAL HIGH (ref 65–99)
Potassium: 4.2 mmol/L (ref 3.5–5.1)
Sodium: 131 mmol/L — ABNORMAL LOW (ref 135–145)

## 2016-02-01 LAB — IRON AND TIBC
Iron: 11 ug/dL — ABNORMAL LOW (ref 28–170)
Saturation Ratios: 3 % — ABNORMAL LOW (ref 10.4–31.8)
TIBC: 357 ug/dL (ref 250–450)
UIBC: 346 ug/dL

## 2016-02-01 LAB — CBC WITH DIFFERENTIAL/PLATELET
Basophils Absolute: 0.1 10*3/uL (ref 0.0–0.1)
Basophils Relative: 1 %
Eosinophils Absolute: 0 10*3/uL (ref 0.0–0.7)
Eosinophils Relative: 0 %
HCT: 21 % — ABNORMAL LOW (ref 36.0–46.0)
Hemoglobin: 6.3 g/dL — CL (ref 12.0–15.0)
Lymphocytes Relative: 14 %
Lymphs Abs: 0.8 10*3/uL (ref 0.7–4.0)
MCH: 23.4 pg — ABNORMAL LOW (ref 26.0–34.0)
MCHC: 30 g/dL (ref 30.0–36.0)
MCV: 78.1 fL (ref 78.0–100.0)
Monocytes Absolute: 0.8 10*3/uL (ref 0.1–1.0)
Monocytes Relative: 14 %
Neutro Abs: 3.9 10*3/uL (ref 1.7–7.7)
Neutrophils Relative %: 71 %
Platelets: 545 10*3/uL — ABNORMAL HIGH (ref 150–400)
RBC: 2.69 MIL/uL — ABNORMAL LOW (ref 3.87–5.11)
RDW: 21.1 % — ABNORMAL HIGH (ref 11.5–15.5)
WBC: 5.6 10*3/uL (ref 4.0–10.5)

## 2016-02-01 LAB — COMPREHENSIVE METABOLIC PANEL
ALT: 11 U/L — ABNORMAL LOW (ref 14–54)
AST: 19 U/L (ref 15–41)
Albumin: 3.5 g/dL (ref 3.5–5.0)
Alkaline Phosphatase: 101 U/L (ref 38–126)
Anion gap: 13 (ref 5–15)
BUN: 28 mg/dL — ABNORMAL HIGH (ref 6–20)
CO2: 18 mmol/L — ABNORMAL LOW (ref 22–32)
Calcium: 8.8 mg/dL — ABNORMAL LOW (ref 8.9–10.3)
Chloride: 93 mmol/L — ABNORMAL LOW (ref 101–111)
Creatinine, Ser: 2.5 mg/dL — ABNORMAL HIGH (ref 0.44–1.00)
GFR calc Af Amer: 25 mL/min — ABNORMAL LOW (ref >60)
GFR calc non Af Amer: 22 mL/min — ABNORMAL LOW (ref >60)
Glucose, Bld: 68 mg/dL (ref 65–99)
Potassium: 4.3 mmol/L (ref 3.5–5.1)
Sodium: 124 mmol/L — ABNORMAL LOW (ref 135–145)
Total Bilirubin: 0.3 mg/dL (ref 0.3–1.2)
Total Protein: 6.8 g/dL (ref 6.5–8.1)

## 2016-02-01 LAB — CBC
HCT: 28.2 % — ABNORMAL LOW (ref 36.0–46.0)
Hemoglobin: 8.3 g/dL — ABNORMAL LOW (ref 12.0–15.0)
MCH: 24.1 pg — ABNORMAL LOW (ref 26.0–34.0)
MCHC: 29.4 g/dL — ABNORMAL LOW (ref 30.0–36.0)
MCV: 82 fL (ref 78.0–100.0)
Platelets: 570 10*3/uL — ABNORMAL HIGH (ref 150–400)
RBC: 3.44 MIL/uL — ABNORMAL LOW (ref 3.87–5.11)
RDW: 20 % — ABNORMAL HIGH (ref 11.5–15.5)
WBC: 7.1 10*3/uL (ref 4.0–10.5)

## 2016-02-01 LAB — VITAMIN B12: Vitamin B-12: 1085 pg/mL — ABNORMAL HIGH (ref 180–914)

## 2016-02-01 LAB — RETICULOCYTES
RBC.: 2.76 MIL/uL — ABNORMAL LOW (ref 3.87–5.11)
Retic Count, Absolute: 22.1 10*3/uL (ref 19.0–186.0)
Retic Ct Pct: 0.8 % (ref 0.4–3.1)

## 2016-02-01 LAB — MRSA PCR SCREENING: MRSA by PCR: NEGATIVE

## 2016-02-01 LAB — HIV ANTIBODY (ROUTINE TESTING W REFLEX): HIV Screen 4th Generation wRfx: NONREACTIVE

## 2016-02-01 LAB — FERRITIN: Ferritin: 19 ng/mL (ref 11–307)

## 2016-02-01 LAB — PREPARE RBC (CROSSMATCH)

## 2016-02-01 LAB — TROPONIN I: Troponin I: <0.03 ng/mL (ref <0.03)

## 2016-02-01 LAB — FOLATE: Folate: 12.4 ng/mL (ref >5.9)

## 2016-02-01 LAB — LACTATE DEHYDROGENASE: LDH: 139 U/L (ref 98–192)

## 2016-02-01 LAB — I-STAT CG4 LACTIC ACID, ED: Lactic Acid, Venous: 1.25 mmol/L (ref 0.5–1.9)

## 2016-02-01 MED ORDER — ACETAMINOPHEN 325 MG PO TABS
650.0000 mg | ORAL_TABLET | Freq: Four times a day (QID) | ORAL | Status: DC | PRN
  Administered 2016-02-03: 650 mg via ORAL
  Filled 2016-02-01: qty 2

## 2016-02-01 MED ORDER — OXYCODONE-ACETAMINOPHEN 5-325 MG PO TABS
1.0000 | ORAL_TABLET | Freq: Once | ORAL | Status: AC
  Administered 2016-02-01: 1 via ORAL
  Filled 2016-02-01: qty 1

## 2016-02-01 MED ORDER — GABAPENTIN 300 MG PO CAPS: 300.0000 mg | ORAL_CAPSULE | Freq: Three times a day (TID) | ORAL | Status: DC

## 2016-02-01 MED ORDER — SODIUM CHLORIDE 0.9 % IV SOLN: INTRAVENOUS | Status: DC

## 2016-02-01 MED ORDER — GABAPENTIN 300 MG PO CAPS
300.0000 mg | ORAL_CAPSULE | Freq: Every day | ORAL | Status: DC
  Administered 2016-02-01 – 2016-02-05 (×5): 300 mg via ORAL
  Filled 2016-02-01 (×6): qty 1

## 2016-02-01 MED ORDER — SODIUM CHLORIDE 0.9 % IV BOLUS (SEPSIS)
1000.0000 mL | Freq: Once | INTRAVENOUS | Status: AC
  Administered 2016-02-01: 1000 mL via INTRAVENOUS

## 2016-02-01 MED ORDER — ALPRAZOLAM 0.5 MG PO TABS
0.5000 mg | ORAL_TABLET | Freq: Every evening | ORAL | Status: AC | PRN
  Administered 2016-02-02: 0.5 mg via ORAL
  Filled 2016-02-01: qty 1

## 2016-02-01 MED ORDER — SODIUM CHLORIDE 0.9% FLUSH
3.0000 mL | Freq: Two times a day (BID) | INTRAVENOUS | Status: DC
  Administered 2016-02-01 – 2016-02-05 (×3): 3 mL via INTRAVENOUS

## 2016-02-01 MED ORDER — SODIUM CHLORIDE 0.9 % IV SOLN
INTRAVENOUS | Status: DC
  Administered 2016-02-01: 100 mL/h via INTRAVENOUS

## 2016-02-01 MED ORDER — ENSURE ENLIVE PO LIQD
237.0000 mL | Freq: Two times a day (BID) | ORAL | Status: DC
  Administered 2016-02-02: 237 mL via ORAL

## 2016-02-01 MED ORDER — ACETAMINOPHEN 650 MG RE SUPP: 650.0000 mg | Freq: Four times a day (QID) | RECTAL | Status: DC | PRN

## 2016-02-01 MED ORDER — OXYCODONE-ACETAMINOPHEN 5-325 MG PO TABS
1.0000 | ORAL_TABLET | ORAL | Status: DC | PRN
  Administered 2016-02-01 – 2016-02-02 (×2): 1 via ORAL
  Filled 2016-02-01 (×2): qty 1

## 2016-02-01 MED ORDER — SODIUM CHLORIDE 0.9 % IV SOLN: Freq: Once | INTRAVENOUS | Status: DC

## 2016-02-01 MED ORDER — SODIUM CHLORIDE 0.9 % IV SOLN
1000.0000 mL | INTRAVENOUS | Status: DC
  Administered 2016-02-01: 1000 mL via INTRAVENOUS

## 2016-02-01 MED ORDER — ALPRAZOLAM 0.5 MG PO TABS: 0.5000 mg | ORAL_TABLET | Freq: Every evening | ORAL | Status: DC | PRN

## 2016-02-01 MED ORDER — SODIUM CHLORIDE 0.9 % IV SOLN
1000.0000 mL | Freq: Once | INTRAVENOUS | Status: AC
  Administered 2016-02-01: 1000 mL via INTRAVENOUS

## 2016-02-01 NOTE — ED Notes (Signed)
MD at bedside. 

## 2016-02-01 NOTE — ED Triage Notes (Signed)
Pt states yesterday, she was holding a bowl, and then all of a sudden she felt like she could not hold it anymore, like it was falling from her hand and felt like she was going to pass out. Pt says tonight, she was walking and almost passed out, she fell against the hallway wall, hitting her head. She reports she has been "staggering" walking lately.

## 2016-02-01 NOTE — ED Provider Notes (Signed)
[facility] DEPT Provider Note   CSN: [id number] Arrival date & time: 02/01/16  6606   By signing my name below, I, Eunice Blase, attest that this documentation has been prepared under the direction and in the presence of Delora Fuel, MD. Electronically signed, Eunice Blase, ED Scribe. 02/01/16. 4:19 AM.   History   Chief Complaint Chief Complaint  Patient presents with  . Near Syncope    The history is provided by the patient. No language interpreter was used.   HPI Comments: Angie [name] is a 50 y.o. female who presents to the Emergency Department complaining of dizziness x 2 days.  The pt describes her dizziness as room spinning and gradual blacking out of vision. She also reports episodes of near syncope with episodes of weakness leading to episodes of near syncope. She also notes associated mild abdominal pain, nausea and vomiting. She further reports weight loss and decreased appetite.  Pt was recently evaluated for same 5 days ago and was diagnosed with an inner ear infection with associated "trouble speaking". However, symptoms have not improved. She denies LOC. No recent fever, chills, diaphoresis, constipation, or diarrhea.  Past Medical History:  Diagnosis Date  . Abnormal Pap smear   . Anemia   . Anxiety    Takes xanax  . Arthritis    Has rx for percocet  . Bulging lumbar disc   . Chronic pain   . Fibromyalgia   . Migraines     Patient Active Problem List   Diagnosis Date Noted  . Subacromial impingement of left shoulder 07/06/2013  . Trochanteric bursitis of left hip 05/11/2013  . Back pain complicating pregnancy 30/16/0109  . AMA (advanced maternal age) multigravida 35+ 05/16/2012  . Insufficient prenatal care 05/16/2012  . Previous cesarean section 05/16/2012  . Arthritis 05/16/2012  . S/P gastric bypass 05/16/2012    Past Surgical History:  Procedure Laterality Date  . CERVICAL CONE BIOPSY    . CESAREAN SECTION     X 3  . CESAREAN SECTION  WITH BILATERAL TUBAL LIGATION Bilateral 10/28/2012   Procedure: Repeat cesarean section with delivery of baby boy at 1210. Apgars 8/9.  BILATERAL TUBAL LIGATION;  Surgeon: Florian Buff, MD;  Location: [facility];  Service: Obstetrics;  Laterality: Bilateral;  . GASTRIC BYPASS  2007  . GASTRIC BYPASS      OB History    Gravida Para Term Preterm AB Living   '6 4 4 '$ 0 2 4   SAB TAB Ectopic Multiple Live Births   2       4       Home Medications    Prior to Admission medications   Medication Sig Start Date End Date Taking? Authorizing Provider  ALPRAZolam Duanne Moron) 1 MG tablet  04/28/13   Historical Provider, MD  buPROPion (WELLBUTRIN XL) 150 MG 24 hr tablet  03/01/13   Historical Provider, MD  Cyanocobalamin (VITAMIN B12 PO) Take 1 tablet by mouth daily.    Historical Provider, MD  ferrous sulfate 325 (65 FE) MG tablet Take 325 mg by mouth 3 (three) times daily.     Historical Provider, MD  gabapentin (NEURONTIN) 300 MG capsule Take 1 capsule (300 mg total) by mouth 3 (three) times daily. 07/06/13   Charlett Blake, MD  HYDROcodone-acetaminophen (NORCO/VICODIN) 5-325 MG per tablet Take 1 tablet by mouth every 4 (four) hours as needed. 07/20/14   Jarome Matin, MD  ibuprofen (ADVIL,MOTRIN) 800 MG tablet Take 1 tablet (800 mg total) by mouth  every 8 (eight) hours as needed. 04/12/15   Dalia Heading, PA-C  lidocaine (XYLOCAINE) 4 % external solution Apply topically 3 (three) times daily as needed. 10/01/13   Charlett Blake, MD  lidocaine (XYLOCAINE) 5 % ointment  10/25/13   Historical Provider, MD  OVER THE COUNTER MEDICATION Take 1 tablet by mouth daily. Biotin    Historical Provider, MD  oxyCODONE-acetaminophen (PERCOCET/ROXICET) 5-325 MG tablet Take 1 tablet by mouth every 6 (six) hours as needed for severe pain. 04/12/15   Dalia Heading, PA-C  penicillin v potassium (VEETID) 500 MG tablet Take 1 tablet (500 mg total) by mouth 4 (four) times daily. 04/12/15   Dalia Heading, PA-C    PredniSONE 10 MG KIT 12 day dose pack po 02/14/14   Gregor Hams, MD  TRI-SPRINTEC 0.18/0.215/0.25 MG-35 MCG tablet  08/29/13   Historical Provider, MD    Family History Family History  Problem Relation Age of Onset  . Diabetes Father   . Depression Maternal Grandmother   . Heart disease Maternal Grandfather   . Depression Paternal Grandmother     Social History Social History  Substance Use Topics  . Smoking status: Former Smoker    Packs/day: 0.50    Years: 10.00    Types: Cigarettes    Quit date: 03/16/2012  . Smokeless tobacco: Never Used  . Alcohol use 0.0 oz/week     Comment: socially     Allergies   Review of patient's allergies indicates no active allergies.   Review of Systems Review of Systems  Constitutional: Positive for appetite change and unexpected weight change. Negative for chills, diaphoresis and fever.  HENT: Positive for voice change.   Cardiovascular: Positive for near-syncope.  Gastrointestinal: Positive for abdominal pain, nausea and vomiting. Negative for constipation and diarrhea.  Neurological: Positive for dizziness (Room spinning) and weakness. Negative for headaches.  All other systems reviewed and are negative.    Physical Exam Updated Vital Signs BP (!) 84/47   Pulse 83   Temp 98.3 F (36.8 C) (Oral)   Resp 17   Ht 5' (1.524 m)   Wt 125 lb (56.7 kg)   SpO2 95%   BMI 24.41 kg/m   Physical Exam  Constitutional: She is oriented to person, place, and time. She appears well-developed and well-nourished.  Cachectic   HENT:  Head: Normocephalic and atraumatic.  TM's are normal  Eyes: EOM are normal. Pupils are equal, round, and reactive to light.  Neck: Normal range of motion. Neck supple. No JVD present.  Cardiovascular: Normal rate, regular rhythm and normal heart sounds.   No murmur heard. Pulmonary/Chest: Effort normal and breath sounds normal. She has no wheezes. She has no rales. She exhibits no tenderness.  Abdominal:  Soft. Bowel sounds are normal. She exhibits no distension and no mass. There is no tenderness.  Musculoskeletal: Normal range of motion. She exhibits no edema.  Lymphadenopathy:    She has no cervical adenopathy.  Neurological: She is alert and oriented to person, place, and time. No cranial nerve deficit. She exhibits normal muscle tone. Coordination normal.  Vertigo is reproduced by extraoccular movement testing and passive head movement.  Skin: Skin is warm and dry. No rash noted.  Psychiatric: She has a normal mood and affect. Her behavior is normal. Judgment and thought content normal.  Nursing note and vitals reviewed.    ED Treatments / Results  DIAGNOSTIC STUDIES: Oxygen Saturation is 95% on RA, adequate by my interpretation.    COORDINATION OF  CARE: 4:19 AM Will order fluids and blood work. Discussed treatment plan with pt at bedside and pt agreed to plan.    Labs (all labs ordered are listed, but only abnormal results are displayed) Labs Reviewed  COMPREHENSIVE METABOLIC PANEL - Abnormal; Notable for the following:       Result Value   Sodium 124 (*)    Chloride 93 (*)    CO2 18 (*)    BUN 28 (*)    Creatinine, Ser 2.50 (*)    Calcium 8.8 (*)    ALT 11 (*)    GFR calc non Af Amer 22 (*)    GFR calc Af Amer 25 (*)    All other components within normal limits  CBC WITH DIFFERENTIAL/PLATELET - Abnormal; Notable for the following:    RBC 2.69 (*)    Hemoglobin 6.3 (*)    HCT 21.0 (*)    MCH 23.4 (*)    RDW 21.1 (*)    Platelets 545 (*)    All other components within normal limits  TROPONIN I  URINALYSIS, ROUTINE W REFLEX MICROSCOPIC (NOT AT [facility])  LACTATE DEHYDROGENASE  VITAMIN B12  FOLATE  IRON AND TIBC  FERRITIN  RETICULOCYTES  HIV ANTIBODY (ROUTINE TESTING)  I-STAT CG4 LACTIC ACID, ED  TYPE AND SCREEN  ABO/RH    EKG  EKG Interpretation  Date/Time:  Sunday February 01 2016 03:41:10 EDT Ventricular Rate:  82 PR Interval:    QRS Duration: 91 QT  Interval:  362 QTC Calculation: 423 R Axis:   15 Text Interpretation:  Sinus rhythm Low voltage, precordial leads Baseline wander in lead(s) V6 When compared with ECG of 01/24/2007, Low voltage QRS is now Present Confirmed by [facility]  MD, Kairee Isa ([id number]) on 02/01/2016 3:52:20 AM        Procedures Procedures (including critical care time) CRITICAL CARE Performed by: VFIEP,PIRJJ Total critical care time: 60 minutes Critical care time was exclusive of separately billable procedures and treating other patients. Critical care was necessary to treat or prevent imminent or life-threatening deterioration. Critical care was time spent personally by me on the following activities: development of treatment plan with patient and/or surrogate as well as nursing, discussions with consultants, evaluation of patient's response to treatment, examination of patient, obtaining history from patient or surrogate, ordering and performing treatments and interventions, ordering and review of laboratory studies, ordering and review of radiographic studies, pulse oximetry and re-evaluation of patient's condition.  Medications Ordered in ED Medications  0.9 %  sodium chloride infusion (0 mLs Intravenous Stopped 02/01/16 0444)    Followed by  0.9 %  sodium chloride infusion (1,000 mLs Intravenous New Bag/Given 02/01/16 0359)  sodium chloride 0.9 % bolus 1,000 mL (1,000 mLs Intravenous New Bag/Given 02/01/16 0537)     Initial Impression / Assessment and Plan / ED Course  I have reviewed the triage vital signs and the nursing notes.  Pertinent labs & imaging results that were available during my care of the patient were reviewed by me and considered in my medical decision making (see chart for details).  Clinical Course   Dizziness which does have some components which suggest peripheral vertigo, but patient is severely hypotensive which certainly would account for dizziness and near syncope. She appears chronically ill  and markedly cachectic which patient blames on having multiple teeth pulled. She is given IV hydration and there was little improvement with 1 L of saline but with a second liter saline, blood pressure has come up into  the low to mid 90s. Laboratory workup.was significant for renal failure with creatinine of 2.5. Also, anemia with hemoglobin 6.3, and moderate to severe hyponatremia with sodium 124. Most recent labs on record provided today were from 3 years ago at which time hemoglobin was 7.9 and creatinine was normal. Troponin is normal. Amazingly, lactic acid is also normal. She will need to be admitted to assess the cause of her ongoing hypotension and also to address her anemia and renal failure. Case is discussed with Dr. Juleen China of internal medicine teaching service who agrees to admit the patient.    Final Clinical Impressions(s) / ED Diagnoses   Final diagnoses:  Hypotension, unspecified hypotension type  Acute kidney injury (nontraumatic) (HCC)  Normocytic anemia  Thrombocytosis (HCC)    New Prescriptions New Prescriptions   No medications on file     I personally performed the services described in this documentation, which was scribed in my presence. The recorded information has been reviewed and is accurate.       Delora Fuel, MD 31/43/88 [id number]

## 2016-02-01 NOTE — H&P (Signed)
Date: 02/01/2016               Patient Name:  Angie [name] MRN: [id number]  DOB: Feb 09, 1966 Age / Sex: 50 y.o., female   PCP: Ricke Hey, MD         Medical Service: Internal Medicine Teaching Service         Attending Physician: Dr. Michel Bickers, MD    First Contact: Dr. Velna Ochs Pager: [phone]  Second Contact: Dr. Jule Ser Pager: [phone]       After Hours (After 5p/  First Contact Pager: [phone]  weekends / holidays): Second Contact Pager: [phone]   Chief Complaint: Dizziness  History of Present Illness: Patient is a 50 yo F with a pmhx significant for gastric bypass surgery who presents with dizziness and weakness. Symptoms started about 3 weeks ago and have been intermittent. Over this time period she describes episodes where she develops spots in her vision, she becomes dizzy, and feels weak all over. She denies LOC. She endorses associated SOB, but denies chest pain. Yesterday she was walking to the kitchen with a bowl in her hand and almost dropped it. She says she sat on the floor for awhile and could not move. On ROS she endorses some numbness and tingling in her fingers and toes as well as difficulty speaking during some of these episodes. She denies unilateral weakness. Over this same time period she has also been experiencing HAs and epigastric pain. HAs are chronic. She endorses a poor appetite and 30 lb weight loss over the past 2-3 weeks. She denies fevers at home. Her BM have been normal and she denies blood in her stool. Patient says she had a colonoscopy a few years ago that was normal. Recent medication changes include the addition of a "water pill" to her regimen about 3 months ago for worsening lower extremity edema.  She denies any missed doses of her medicines. She takes percocet daily for fibromyalgia and xanax about three times a week for anxiety.   In the ED, patient was hypotensive BP 84/47 with HR 83, RR 17, and oxygen saturating 95% on  RA. She was given 3 L of NS bolus with no improvement in her pressures. Labs were significant for creatinine of 2.5 (0.57 three years ago), and sodium 124. CBC with hemoglobin of 6.3 (7.9 three years ago). I-stat troponin < 0.03. Venous lactic acid 1.25. UA unremarkable. LDH, Vit B12, and Folate all normal. Iron studies with low-normal Ferritin and low iron level of 11. TIBC normal. HIV pending.    Meds:  No outpatient prescriptions have been marked as taking for the 02/01/16 encounter [facility] Encounter).     Allergies: Allergies as of 02/01/2016  . (No Known Allergies)   Past Medical History:  Diagnosis Date  . Abnormal Pap smear   . Anemia   . Anxiety    Takes xanax  . Arthritis    Has rx for percocet  . Bulging lumbar disc   . Chronic pain   . Fibromyalgia   . Migraines     Family History: Anemia in her mom and grandmother, heart disease in her grandmother and uncle   Social History: Patient lives here in [place]. She does not work, she is on disability. She drinks alcohol, one shot of liquor about three times a week. She denies tobacco and illicit drug use.   Review of Systems: A complete ROS was negative except as per HPI.   Physical Exam:  Blood pressure (!) 87/55, pulse 87, temperature 98.3 F (36.8 C), temperature source Oral, resp. rate 16, height 5' (1.524 m), weight 125 lb (56.7 kg), SpO2 99 %. Constitutional: Thin lady appears older than stated age, NAD, appears comfortable HEENT: Atraumatic, normocephalic. PERRL, anicteric sclera.  Cardiovascular: RRR, no murmurs, rubs, or gallops.  Pulmonary/Chest: CTAB, no wheezes, rales, or rhonchi. No chest wall abnormalities.  Abdominal: Soft, non tender, non distended. +BS.  Extremities: Warm and well perfused. Distal pulses intact. No edema.  Neurological: A&Ox3, CN II - XII grossly intact. No facial asymmetric. Strength symmetric bilaterally. Skin: No rashes or erythema  Psychiatric: Normal mood and affect  EKG:  Personally reviewed. Sinus rhythm. Once PVC. No acute ischemic changes.   Assessment & Plan by Problem:  Hypotension: Patient presented with episodic dizziness for the past 3 weeks. Found to be hypotensive on presentation. Patient is taking percocet daily, xanax, Ambien (all verified with database) and was recently started on a "water pill" about three months ago per patient report. Called pharmacy, they have no record of a diuretic on file for her. She is on amlodipine 10 mg daily. Etiology is likely polypharmacy vs hypovolemia, however pressures have been unresponsive to fluid resuscitation thus far.  -- S/p 3 L bolus in ED -- NS 125 cc/hr  -- Hold all possible offending medications for now (percocet, Ambien, xanax, and amlodipine)  -- Cardiac monitoring   Anemia: Iron studies this admission consistent with iron deficiency anemia. Patient is on iron supplementation at home. Likely malabsorption from her gastric bypass in 2007. Hgb 7.9 three years ago, 6.3 on presentation. Patient is symptomatic.  -- Will transfuse 1 unit now, f/u post transfusion H&H -- Continue to monitor, transfuse as needed to maintain hgb > 7  Hyponatremia: Possibly from diuretic use (per patient report) in the setting of hypovolemia, however pharmacy has no record of diuretic on file. Will continue to monitor.  -- IVFs NS 125 cc/hr  -- Repeat BMP this afternoon, then again tomorrow AM  AKI: Creatinine elevated to 2.5 on admission. Baseline unknown. Creatinine three years ago 0.57. Likely acute on chronic, prerenal from hypovolemia. -- Avoid nephrotoxic meds -- Continue fluids -- Repeat AM labs  Chronic pain: Due to fibromyalgia, arthritis, and chronic back pain from a work related injury many years ago. Takes percocet daily (verifited with database), gabapentin daily, and ibuprofen about 3 times a week.  -- Hold percocet  -- Continue gabapentin 300 mg TID -- Tylenol prn   HTN: -- Hold home amlodipine 10 mg daily     FEN: NS 125 cc/hr, replete lytes prn,  Heart healthy diet  VTE ppx: SCDs Code Status: FULL  Dispo: Admit patient to Inpatient with expected length of stay greater than 2 midnights.  Signed: Velna Ochs, MD 02/01/2016, 7:09 AM  Pager: [phone]

## 2016-02-02 DIAGNOSIS — N179 Acute kidney failure, unspecified: Principal | ICD-10-CM

## 2016-02-02 DIAGNOSIS — E871 Hypo-osmolality and hyponatremia: Secondary | ICD-10-CM

## 2016-02-02 DIAGNOSIS — R1013 Epigastric pain: Secondary | ICD-10-CM

## 2016-02-02 DIAGNOSIS — Z79899 Other long term (current) drug therapy: Secondary | ICD-10-CM

## 2016-02-02 DIAGNOSIS — E43 Unspecified severe protein-calorie malnutrition: Secondary | ICD-10-CM

## 2016-02-02 DIAGNOSIS — R110 Nausea: Secondary | ICD-10-CM

## 2016-02-02 DIAGNOSIS — D509 Iron deficiency anemia, unspecified: Secondary | ICD-10-CM

## 2016-02-02 DIAGNOSIS — E872 Acidosis: Secondary | ICD-10-CM

## 2016-02-02 DIAGNOSIS — G8929 Other chronic pain: Secondary | ICD-10-CM

## 2016-02-02 DIAGNOSIS — I959 Hypotension, unspecified: Secondary | ICD-10-CM

## 2016-02-02 DIAGNOSIS — M1990 Unspecified osteoarthritis, unspecified site: Secondary | ICD-10-CM

## 2016-02-02 DIAGNOSIS — Z9884 Bariatric surgery status: Secondary | ICD-10-CM

## 2016-02-02 DIAGNOSIS — M549 Dorsalgia, unspecified: Secondary | ICD-10-CM

## 2016-02-02 DIAGNOSIS — I10 Essential (primary) hypertension: Secondary | ICD-10-CM

## 2016-02-02 DIAGNOSIS — M797 Fibromyalgia: Secondary | ICD-10-CM

## 2016-02-02 DIAGNOSIS — Z9889 Other specified postprocedural states: Secondary | ICD-10-CM

## 2016-02-02 LAB — CBC
HCT: 27.1 % — ABNORMAL LOW (ref 36.0–46.0)
Hemoglobin: 7.9 g/dL — ABNORMAL LOW (ref 12.0–15.0)
MCH: 24.1 pg — ABNORMAL LOW (ref 26.0–34.0)
MCHC: 29.2 g/dL — ABNORMAL LOW (ref 30.0–36.0)
MCV: 82.6 fL (ref 78.0–100.0)
Platelets: 464 10*3/uL — ABNORMAL HIGH (ref 150–400)
RBC: 3.28 MIL/uL — ABNORMAL LOW (ref 3.87–5.11)
RDW: 20.3 % — ABNORMAL HIGH (ref 11.5–15.5)
WBC: 6.9 10*3/uL (ref 4.0–10.5)

## 2016-02-02 LAB — COMPREHENSIVE METABOLIC PANEL
ALT: 10 U/L — ABNORMAL LOW (ref 14–54)
ALT: 9 U/L — ABNORMAL LOW (ref 14–54)
AST: 20 U/L (ref 15–41)
AST: 15 U/L (ref 15–41)
Albumin: 2.8 g/dL — ABNORMAL LOW (ref 3.5–5.0)
Albumin: 2.7 g/dL — ABNORMAL LOW (ref 3.5–5.0)
Alkaline Phosphatase: 86 U/L (ref 38–126)
Alkaline Phosphatase: 86 U/L (ref 38–126)
Anion gap: 8 (ref 5–15)
Anion gap: 8 (ref 5–15)
BUN: 11 mg/dL (ref 6–20)
BUN: 8 mg/dL (ref 6–20)
CO2: 16 mmol/L — ABNORMAL LOW (ref 22–32)
CO2: 18 mmol/L — ABNORMAL LOW (ref 22–32)
Calcium: 8.3 mg/dL — ABNORMAL LOW (ref 8.9–10.3)
Calcium: 8.2 mg/dL — ABNORMAL LOW (ref 8.9–10.3)
Chloride: 109 mmol/L (ref 101–111)
Chloride: 103 mmol/L (ref 101–111)
Creatinine, Ser: 0.77 mg/dL (ref 0.44–1.00)
Creatinine, Ser: 0.65 mg/dL (ref 0.44–1.00)
GFR calc Af Amer: >60 mL/min (ref >60)
GFR calc Af Amer: >60 mL/min (ref >60)
GFR calc non Af Amer: >60 mL/min (ref >60)
GFR calc non Af Amer: >60 mL/min (ref >60)
Glucose, Bld: 71 mg/dL (ref 65–99)
Glucose, Bld: 106 mg/dL — ABNORMAL HIGH (ref 65–99)
Potassium: 4.3 mmol/L (ref 3.5–5.1)
Potassium: 4.4 mmol/L (ref 3.5–5.1)
Sodium: 133 mmol/L — ABNORMAL LOW (ref 135–145)
Sodium: 129 mmol/L — ABNORMAL LOW (ref 135–145)
Total Bilirubin: 0.5 mg/dL (ref 0.3–1.2)
Total Bilirubin: 0.2 mg/dL — ABNORMAL LOW (ref 0.3–1.2)
Total Protein: 5.7 g/dL — ABNORMAL LOW (ref 6.5–8.1)
Total Protein: 5.9 g/dL — ABNORMAL LOW (ref 6.5–8.1)

## 2016-02-02 LAB — SODIUM, URINE, RANDOM: Sodium, Ur: 86 mmol/L

## 2016-02-02 LAB — LIPASE, BLOOD: Lipase: 40 U/L (ref 11–51)

## 2016-02-02 LAB — OSMOLALITY, URINE: Osmolality, Ur: 409 mosm/kg (ref 300–900)

## 2016-02-02 LAB — OSMOLALITY: Osmolality: 270 mOsm/kg — ABNORMAL LOW (ref 275–295)

## 2016-02-02 MED ORDER — OXYCODONE-ACETAMINOPHEN 10-325 MG PO TABS: 1.0000 | ORAL_TABLET | Freq: Two times a day (BID) | ORAL | Status: DC | PRN

## 2016-02-02 MED ORDER — PANTOPRAZOLE SODIUM 40 MG PO TBEC
40.0000 mg | DELAYED_RELEASE_TABLET | Freq: Every day | ORAL | Status: DC
  Administered 2016-02-02 – 2016-02-03 (×2): 40 mg via ORAL
  Filled 2016-02-02 (×2): qty 1

## 2016-02-02 MED ORDER — DICLOFENAC SODIUM 1 % TD GEL
2.0000 g | Freq: Three times a day (TID) | TRANSDERMAL | Status: DC | PRN
  Administered 2016-02-02: 2 g via TOPICAL
  Filled 2016-02-02: qty 100

## 2016-02-02 MED ORDER — ALPRAZOLAM 0.5 MG PO TABS
0.5000 mg | ORAL_TABLET | Freq: Two times a day (BID) | ORAL | Status: DC | PRN
  Administered 2016-02-02 – 2016-02-05 (×6): 0.5 mg via ORAL
  Filled 2016-02-02 (×6): qty 1

## 2016-02-02 MED ORDER — OXYCODONE HCL 5 MG PO TABS
5.0000 mg | ORAL_TABLET | Freq: Two times a day (BID) | ORAL | Status: DC | PRN
  Administered 2016-02-02 – 2016-02-03 (×3): 5 mg via ORAL
  Filled 2016-02-02 (×3): qty 1

## 2016-02-02 MED ORDER — OXYCODONE-ACETAMINOPHEN 5-325 MG PO TABS
1.0000 | ORAL_TABLET | Freq: Two times a day (BID) | ORAL | Status: DC | PRN
  Administered 2016-02-02 – 2016-02-04 (×4): 1 via ORAL
  Filled 2016-02-02 (×5): qty 1

## 2016-02-02 MED ORDER — SODIUM CHLORIDE 0.9 % IV SOLN
INTRAVENOUS | Status: DC
  Administered 2016-02-02: 21:00:00 via INTRAVENOUS

## 2016-02-02 MED ORDER — BOOST / RESOURCE BREEZE PO LIQD
1.0000 | Freq: Three times a day (TID) | ORAL | Status: DC
  Administered 2016-02-02 – 2016-02-05 (×10): 1 via ORAL

## 2016-02-02 MED ORDER — IBUPROFEN 600 MG PO TABS
600.0000 mg | ORAL_TABLET | Freq: Four times a day (QID) | ORAL | Status: DC | PRN
  Administered 2016-02-02 – 2016-02-03 (×2): 600 mg via ORAL
  Filled 2016-02-02 (×2): qty 1

## 2016-02-02 NOTE — Progress Notes (Signed)
   02/02/16 1256  Vitals  Temp 97.8 F (36.6 C)  Temp Source Oral  BP (!) 86/55  MAP (mmHg) (!) 62  BP Location Left Arm  BP Method Doppler  Patient Position (if appropriate) Lying  Pulse Rate 78  Resp 17  Oxygen Therapy  SpO2 100 %  O2 Device Room Air  Height and Weight  Height 5' (1.524 m)  Weight 54.7 kg (120 lb 11.2 oz)  BSA (Calculated - sq m) 1.52 sq meters  BMI (Calculated) 23.6  Weight in (lb) to have BMI = 25 127.7     02/02/16 1256  Vitals  Temp 97.8 F (36.6 C)  Temp Source Oral  BP (!) 86/55  MAP (mmHg) (!) 62  BP Location Left Arm  BP Method Doppler  Patient Position (if appropriate) Lying  Pulse Rate 78  Resp 17  Oxygen Therapy  SpO2 100 %  O2 Device Room Air  Height and Weight  Height 5' (1.524 m)  Weight 54.7 kg (120 lb 11.2 oz)  BSA (Calculated - sq m) 1.52 sq meters  BMI (Calculated) 23.6  Weight in (lb) to have BMI = 25 127.7

## 2016-02-02 NOTE — Progress Notes (Signed)
Initial Nutrition Assessment  DOCUMENTATION CODES:   Severe malnutrition in context of acute illness/injury  INTERVENTION:    Boost Breeze po TID, each supplement provides 250 kcal and 9 grams of protein  Snacks TID between meals.  Lactaid with meals.  NUTRITION DIAGNOSIS:   Malnutrition related to acute illness as evidenced by severe depletion of muscle mass, severe depletion of body fat, percent weight loss.  GOAL:   Patient will meet greater than or equal to 90% of their needs  MONITOR:   PO intake, Supplement acceptance, Labs, Weight trends, I & O's  REASON FOR ASSESSMENT:   Consult Poor PO  ASSESSMENT:   50 yo F with a pmhx significant for gastric bypass surgery who presents with dizziness and weakness.  Patient had gastric bypass surgery in 2007. She has been tolerating a normal diet for many years since surgery. Patient had to have her teeth extracted ~3 months ago due to periodontal disease. Since that time she has been unable to have dentures fitted, so she has been eating poorly. She has lost ~30 lbs over the past 3 months due to decreased intake. She still has to have a few more teeth removed to get the dentures, so she suspects she will be without dentures for at least another 2 months.  She tried Ensure supplement earlier today and "it sent me straight to the bathroom." Discussed with patient ways to increase oral intake while she is having difficulty chewing due to no teeth or dentures. Nutrition-Focused physical exam completed. Findings are severe fat depletion, severe muscle depletion, and no edema.  Patient with severe PCM. Labs reviewed sodium low. Medications reviewed.  Diet Order:  Diet regular Room service appropriate? Yes; Fluid consistency: Thin  Skin:  Reviewed, no issues  Last BM:  10/8  Height:   Ht Readings from Last 1 Encounters:  02/02/16 5' (1.524 m)    Weight:   Wt Readings from Last 1 Encounters:  02/02/16 120 lb 11.2 oz  (54.7 kg)    Ideal Body Weight:  45.5 kg  BMI:  Body mass index is 23.57 kg/m.  Estimated Nutritional Needs:   Kcal:  1600-1800  Protein:  80-100 gm  Fluid:  1.6-1.8 L  EDUCATION NEEDS:   Education needs addressed  Molli Barrows, [place], [place], [place] Pager [phone] After Hours Pager [phone]

## 2016-02-02 NOTE — Progress Notes (Signed)
   Discussed case with internal medicine residency team. Greatly appreciate their care of patient up to this point. Patient was admitted overnight on 02/01/2016. [facility] will assume care of patient on 02/03/2016.  Linna Darner, MD [facility] 02/02/2016, 9:12 AM

## 2016-02-02 NOTE — Progress Notes (Signed)
   02/02/16 1256  Vitals  Temp 97.8 F (36.6 C)  Temp Source Oral  BP (!) 86/55  MAP (mmHg) (!) 62  BP Location Left Arm  BP Method Doppler  Patient Position (if appropriate) Lying  Pulse Rate 78  Resp 17  Oxygen Therapy  SpO2 100 %  O2 Device Room Air  Height and Weight  Height 5' (1.524 m)  Weight 54.7 kg (120 lb 11.2 oz)  BSA (Calculated - sq m) 1.52 sq meters  BMI (Calculated) 23.6  Weight in (lb) to have BMI = 25 127.7  Dr. Berkley Harvey notifed of patient BP

## 2016-02-02 NOTE — Progress Notes (Signed)
Date: 02/02/2016  Patient name: Angie [name]  Medical record number: [id number]  Date of birth: 1965-12-30   I have seen and evaluated Angie [name] and discussed their care with the Residency Team. Briefly, Angie [name] is a 50yo woman with PMH significant for gastric bypass surgery, FM who presented with dizziness and weakness.  She attributes the recent change in symptoms to starting a "water pill" for LE edema.  She reports that the symptoms have been intermittent for 3 weeks.  She has not lost consciousness.  She further notes numbness and tingling in her hands when this happens along with difficulty speaking.  She has no stroke like symptoms with hemiparesis.  Further symptoms include recent need to have teeth removed and therefore poor po intake, weight loss of 30 pounds, poor appetite.  She further has chronic epigastric pain and headaches.  On admission in the ED, she was noted to be hypotensive. Her BP did not initially respond to fluids, but they did eventually improve.  Lactic acid was 1.25.  Further issues her hyponatremia and anemia.    PMHx, Fam Hx, and/or Soc Hx : Further medical history includes chronic pain.  She has a history of anemia in her grandmother.  She drinks ETOH but only a few times per week.    Vitals:   02/02/16 0749 02/02/16 1256  BP: 95/70 (!) 86/55  Pulse: 79 78  Resp: 13 17  Temp: 98.1 F (36.7 C) 97.8 F (36.6 C)   Gen: Thin, muscle wasting at temples, no acute distress CV: Tachycardic, regular, no murmur Pulm: CTAB, no wheezing Abd: Soft, +BS, ND, NT Ext: Thin, no edema Skin: No rashes or wounds Neuro: Grossly intact, moving all extremities, no weakness.    Pertinent data NA  124 --> 131 --> 133 K 4.3 Bicarb 18 --> 19 --> 16 Cr 2.5 --> 1.18 --> 0.77 Albumin 2.8  WBC 5.6  Hgb 6.3 --> 8.3 --> 7.9  LDH normal Fe 11 Ferritin 19 Retic count, normal (should be elevated)  Assessment and Plan: I have seen and evaluated the patient as outlined  above. I agree with the formulated Assessment and Plan as detailed in the residents' note, with the following changes:   1. Hypotension - Unclear source, could be severe malnutrition which would explain other derangements - IVF with NS improved her BP, however, her Na was correcting quickly and this was stopped - Nutrition consult for PO intake - Infection was considered, lactic acid normal, there is no sign of infection on exam, patient is mentating well, UA negative.  WBC WNL - Check HIV - negative - Monitor BP closely - Check orthostatic BP - Hold any antihypertensives  2. Iron deficiency anemia - LDH nml, LFTs nml - Likely severe iron deficiency given iron level, low saturation and low normal ferritin.  She received 1 unit of PRBC with good results.  Retic count is normal, which should be high in her case, but with very low iron her BM would not be able to make an adequate response.  Consider Hgb electrophoresis, possibly she has thalassemia (though MCV is normal).   - Check B12 (high), folate (normal) - Trend CBC - Transfuse for Hgb < 7.0 - Consider iron infusion given low levels  3. Hyponatremia - Unfortunately osm/urine Na not obtained prior to NS being given.  Given her other findings on exam, dehydration with concomitant diuretic (reported by her) is a clear cause, however, given her persistently low BP would need to  consider other causes such as adrenal failure and possible heart failure.   - Check Urine osm, serum osm, urine Na - Stop fluids - Trend BMET today to ensure not correcting too quickly - Nutrition consult - AM cortisol  4. AKI with non anion gap metabolic acidosis - Improved with IVF, making dehydration more likely - Work up of hyponatremia as above - Trend BMET - Avoid nephrotoxins - Bicarb persistently low at 16 this AM, continue to monitor.  AG 8 (normal).    5. Epigastric pain - Check lipase today - Chronic, Glucose 135 on admission, consider A1C and  checking gastric emptying study  6. Chronic pain - On percocet (1-2 every few hours per patient) which could be contributing to issue - Also on prn xanax at home - These were restarted once BP improved, at lower doses with close monitoring.  Hold if BP becomes low again.   Resident team is seeing the patient.  Patient will be transferred to [facility] tomorrow.    Sid Falcon, MD 10/9/20173:16 PM

## 2016-02-02 NOTE — Progress Notes (Addendum)
   Subjective: Patient continues to experience dizziness but says it has significantly improved. Patient is complaining of pain in her back and legs. She received two doses of her percocet overnight. Denies chest pain and SOB.   Objective:  Vital signs in last 24 hours: Vitals:   02/01/16 1945 02/02/16 0403 02/02/16 0405 02/02/16 0749  BP: 102/71 (!) 91/51 (!) 91/51 95/70  Pulse: 83 70 98 79  Resp: (!) 22 13 20 13   Temp:   97.4 F (36.3 C) 98.1 F (36.7 C)  TempSrc:   Oral Oral  SpO2: 100% 100% 100% 100%  Weight:      Height:       Physical Exam: Constitutional: Thin lady appears older than stated age, NAD, appears comfortable Cardiovascular: RRR, no murmurs, rubs, or gallops.  Pulmonary/Chest: CTAB, no wheezes, rales, or rhonchi. No chest wall abnormalities.  Abdominal: Soft, non tender, non distended. +BS.  Extremities: Warm and well perfused. Distal pulses intact. No edema.  Skin: No rashes or erythema  Psychiatric: Normal mood and affect  Assessment/Plan:  Hypotension: Improved this morning, 115/75 on recheck. Dizziness has improved as well. Fluids discontinued this morning. Will continue to hold amlodipine and ambien.  -- Restart home percocet BID prn; will increase to home dose of TID as BP tolerates  -- Restart home xanax at half dose (0.5 BID prn) -- Cardiac monitoring   Anemia: Iron studies this admission consistent with iron deficiency anemia. Patient is on iron supplementation at home. Likely malabsorption from her gastric bypass in 2007. Hgb 6.3 on presentation (7.9 three years ago) and patient was symptomatic. Now s/p 1 unit pRBC in ED. Post transfusion H&H was 8.3 & 28.  -- Hgb stable today at 7.9 -- Continue to monitor with daily CBCs -- Transfuse as needed to maintain hgb > 7  Hyponatremia: Improving, 133 today from 124 yesterday. Fluids discontinued. Etiology likely diuretic use vs hypocortisolism, given blood pressures initially did not respond to  fluids. -- Will check AM cortisol  -- Repeat BMP this afternoon, then again tomorrow AM  Abdominal Pain: x 1 week, epigastric with associated nausea. Abdominal exam is benign. Will check lipase and CMP. -- Protonix 40 mg daily   Ear pressure?: Received a Giusto this afternoon that patient complaining of "echoing in ear" for the past few weeks associated with her dizziness. Denies ringing. PCP told her to take antihistamines, patient believes she has an ear infection. -- Will address on rounds tomorrow  AKI: Resolved with fluids. Creatinine elevated to 2.5 on admission, down to 0.77 today. Creatinine 0.57 three years ago. Likely prerenal from hypovolemia. -- Avoid nephrotoxic meds -- Continue to monitor  -- Repeat AM labs  Chronic pain: Due to fibromyalgia, arthritis, and chronic back pain from a work related injury many years ago. Takes percocet daily (verifited with database), gabapentin daily, and ibuprofen about 3 times a week.  -- Restart percocet BID prn -- Ibuprofen 600 mg q6h prn  -- Continue gabapentin 300 mg TID -- Tylenol prn   HTN: -- Hold home amlodipine 10 mg daily   FEN: Fluids disconintued, replete lytes prn,  Heart healthy diet  VTE ppx: SCDs Code Status: FULL  Dispo: Anticipated discharge in approximately 1-2 day(s).   Velna Ochs, MD 02/02/2016, 10:41 AM Pager: [phone]

## 2016-02-03 DIAGNOSIS — D508 Other iron deficiency anemias: Secondary | ICD-10-CM

## 2016-02-03 LAB — CBC
HCT: 26.8 % — ABNORMAL LOW (ref 36.0–46.0)
Hemoglobin: 7.8 g/dL — ABNORMAL LOW (ref 12.0–15.0)
MCH: 24.1 pg — ABNORMAL LOW (ref 26.0–34.0)
MCHC: 29.1 g/dL — ABNORMAL LOW (ref 30.0–36.0)
MCV: 83 fL (ref 78.0–100.0)
Platelets: 426 10*3/uL — ABNORMAL HIGH (ref 150–400)
RBC: 3.23 MIL/uL — ABNORMAL LOW (ref 3.87–5.11)
RDW: 21 % — ABNORMAL HIGH (ref 11.5–15.5)
WBC: 6.8 10*3/uL (ref 4.0–10.5)

## 2016-02-03 LAB — BASIC METABOLIC PANEL
Anion gap: 7 (ref 5–15)
Anion gap: 8 (ref 5–15)
BUN: 7 mg/dL (ref 6–20)
BUN: 6 mg/dL (ref 6–20)
CO2: 20 mmol/L — ABNORMAL LOW (ref 22–32)
CO2: 17 mmol/L — ABNORMAL LOW (ref 22–32)
Calcium: 8.1 mg/dL — ABNORMAL LOW (ref 8.9–10.3)
Calcium: 8.3 mg/dL — ABNORMAL LOW (ref 8.9–10.3)
Chloride: 104 mmol/L (ref 101–111)
Chloride: 107 mmol/L (ref 101–111)
Creatinine, Ser: 0.73 mg/dL (ref 0.44–1.00)
Creatinine, Ser: 0.66 mg/dL (ref 0.44–1.00)
GFR calc Af Amer: >60 mL/min (ref >60)
GFR calc Af Amer: >60 mL/min (ref >60)
GFR calc non Af Amer: >60 mL/min (ref >60)
GFR calc non Af Amer: >60 mL/min (ref >60)
Glucose, Bld: 120 mg/dL — ABNORMAL HIGH (ref 65–99)
Glucose, Bld: 76 mg/dL (ref 65–99)
Potassium: 4.2 mmol/L (ref 3.5–5.1)
Potassium: 4.3 mmol/L (ref 3.5–5.1)
Sodium: 131 mmol/L — ABNORMAL LOW (ref 135–145)
Sodium: 132 mmol/L — ABNORMAL LOW (ref 135–145)

## 2016-02-03 LAB — CORTISOL-AM, BLOOD: Cortisol - AM: 12.5 ug/dL (ref 6.7–22.6)

## 2016-02-03 LAB — HEMOGLOBIN A1C
Hgb A1c MFr Bld: 4.9 % (ref 4.8–5.6)
Mean Plasma Glucose: 94 mg/dL

## 2016-02-03 MED ORDER — TRAMADOL HCL 50 MG PO TABS
50.0000 mg | ORAL_TABLET | Freq: Once | ORAL | Status: DC
  Filled 2016-02-03: qty 1

## 2016-02-03 MED ORDER — SODIUM CHLORIDE 0.9 % IV SOLN
INTRAVENOUS | Status: DC
  Administered 2016-02-03 – 2016-02-05 (×4): via INTRAVENOUS

## 2016-02-03 NOTE — Evaluation (Signed)
Physical Therapy Evaluation Patient Details Name: Angie [name] MRN: [id number] DOB: [birth year] Today's Date: 02/03/2016   History of Present Illness  pt is a 50 y/o female with pmh of gastric bypass surgery, migraines and fibromyalgia, admitted with dizziness and weakness.  Clinical Impression  Pt admitted with/for weakness and dizziness.  Pt currently limited functionally due to the problems listed below.  (see problems list.)  Pt will benefit from PT to maximize function and safety to be able to get home safely with available assist of family.     Follow Up Recommendations Home health PT    Equipment Recommendations       Recommendations for Other Services       Precautions / Restrictions Precautions Precautions: Fall      Mobility  Bed Mobility Overal bed mobility: Modified Independent                Transfers Overall transfer level: Needs assistance   Transfers: Sit to/from Stand Sit to Stand: Supervision         General transfer comment: for safety  Ambulation/Gait Ambulation/Gait assistance: Min guard Ambulation Distance (Feet): 140 Feet Assistive device: None (or rail) Gait Pattern/deviations: Step-through pattern     General Gait Details: very weak gait with consistently flexed knees, flexed posture, slow cadence.  Fatigued quickly, Scanning made her dizzy and unforcused visually, but not vertigo.  Stairs            Wheelchair Mobility    Modified Rankin (Stroke Patients Only)       Balance Overall balance assessment: Needs assistance   Sitting balance-Leahy Scale: Good       Standing balance-Leahy Scale: Fair                               Pertinent Vitals/Pain Pain Assessment: Faces Faces Pain Scale: Hurts a little bit Pain Location: vague Pain Descriptors / Indicators: Discomfort Pain Intervention(s): Monitored during session;Repositioned    Home Living Family/patient expects to be discharged to::  Private residence Living Arrangements: Children (2 adult children, 1 with infant and pt's 85 y/o) Available Help at Discharge: Family;Available PRN/intermittently Type of Home: House       Home Layout: One level        Prior Function Level of Independence: Independent         Comments: pt reports being "stretched thin" keeping the house going and not getting time to eat well.     Hand Dominance        Extremity/Trunk Assessment               Lower Extremity Assessment: Generalized weakness         Communication   Communication: No difficulties  Cognition Arousal/Alertness: Awake/alert Behavior During Therapy: WFL for tasks assessed/performed Overall Cognitive Status: Within Functional Limits for tasks assessed                      General Comments General comments (skin integrity, edema, etc.): Basic Vestibular assessment--Pt relates her dizzines as more like lightheadedness and passing out than spinning, although she talked about spinning and loss of equilibrium as the lightheadedness got worse.   Occulomotor exam negative,  Check for BPPV, posterio/horizontal were negative for vertigo/nystagmus with hallpike and supine head roll.  Alltesting/questions put together leads me to think malnutrition, low Hgb, dehydration, low energy, BUT still having to be a Mom that is overdoing things to  keep her household going.    Exercises     Assessment/Plan    PT Assessment Patient needs continued PT services  PT Problem List Decreased strength;Decreased activity tolerance;Decreased balance;Decreased mobility;Decreased coordination          PT Treatment Interventions DME instruction;Gait training;Stair training;Functional mobility training;Therapeutic activities;Balance training;Patient/family education    PT Goals (Current goals can be found in the Care Plan section)  Acute Rehab PT Goals Patient Stated Goal: Get stronger, get some of this weight back and get  my teeth fixed. PT Goal Formulation: With patient Time For Goal Achievement: 02/17/16 Potential to Achieve Goals: Good    Frequency Min 3X/week   Barriers to discharge        Co-evaluation               End of Session   Activity Tolerance: Patient limited by fatigue Patient left: in bed;with call bell/phone within reach Nurse Communication: Mobility status         Time: [id number] PT Time Calculation (min) (ACUTE ONLY): 55 min   Charges:   PT Evaluation $PT Eval Moderate Complexity: 1 Procedure PT Treatments $Gait Training: 8-22 mins $Therapeutic Activity: 8-22 mins $Neuromuscular Re-education: 8-22 mins   PT G Codes:        Takiera Mayo, Tessie Fass 02/03/2016, 3:18 PM 02/03/2016  Donnella Sham, PT [phone]  (pager)

## 2016-02-03 NOTE — Progress Notes (Signed)
PROGRESS NOTE    Angie [name]  [id number] DOB: 04-05-1966 DOA: 02/01/2016 PCP: Ricke Hey, MD   Brief Narrative: Angie [name] is a 50 y.o. with a history of gastric bypass surgery, fibromyalgia, migraines, anxiety that presented with dizziness and found to be hypotensive.   Assessment & Plan:   Active Problems:   Arthritis   S/P gastric bypass   Anemia   Hypotension   Hyponatremia   Acute kidney injury ([place])  Hypotension Appears secondary to diuretic use vs dehydration. AM cortisol normal. Patient is also very malnourished and this is likely contributing. Patient was apparently taking a diuretic secondary to edema, however, she does not know the name nor is it on her med list. -restart IVF -physical therapy -orthostatic vitals -liberalize salt intake -I/O -encourage increased PO intake  Severe malnutrition Appears in part secondary to poor dentition (patient is edentulous) and history of gastric bypass. Nutrition has seen patient. I suspect this is a big issues for her current presentation. -nutrition recommendations -physical therapy  Ear discomfort No pain. No evidence of infection. Some fluid seen behind right TM. -outpatient ENT -vestibular PT  Hyponatremia Improved with fluid. Likely secondary to dehydration vs diuretic use. -BMP  Anemia S/p 1u PRBC. -CBC -iron supplementation  AKI Secondary to dehydration. Improved with IVF. Stable.  -BMP -avoid nephrotoxins  Epigastric pain Lipase  Chronic pain Anxiety Patient currently on oxycodon 5mg  q12, percocet 5-325mg  q12 and xanax. -Continue gabapentin -discontinue oxycodone 5mg    DVT prophylaxis: SCDs Code Status: Full code Family Communication: None at bedside Disposition Plan: Discharge home vs SNF   Consultants:   None  Procedures:   None  Antimicrobials:  None    Subjective: Patient reports chronic pain and a feeling of hearing an echo of  herself  Objective: Vitals:   02/02/16 1741 02/02/16 2054 02/02/16 2140 02/03/16 0439  BP: 104/83 94/64 (!) 86/55 95/63  Pulse:   81 75  Resp:   16 19  Temp:   99.5 F (37.5 C) 97.9 F (36.6 C)  TempSrc:   Oral Oral  SpO2:   100% 100%  Weight:      Height:        Intake/Output Summary (Last 24 hours) at 02/03/16 1436 Last data filed at 02/03/16 1140  Gross per 24 hour  Intake                0 ml  Output              800 ml  Net             -800 ml   Filed Weights   02/01/16 0344 02/02/16 1256  Weight: 56.7 kg (125 lb) 54.7 kg (120 lb 11.2 oz)    Examination:  General exam: Appears calm and comfortable. Severely malnourished appearing and frail Respiratory system: Clear to auscultation. Respiratory effort normal. Cardiovascular system: S1 & S2 heard, RRR. No murmurs, rubs, gallops or clicks. Gastrointestinal system: Abdomen is nondistended, soft and nontender. No organomegaly or masses felt. Normal bowel sounds heard. Central nervous system: nods off during interview. oriented. No focal neurological deficits. Extremities: No edema. No calf tenderness Skin: No cyanosis. No rashes Psychiatry: Judgement and insight appear normal. Mood & affect appropriate.     Data Reviewed: I have personally reviewed following labs and imaging studies  CBC:  Recent Labs Lab 02/01/16 0400 02/01/16 1525 02/02/16 0556 02/03/16 0719  WBC 5.6 7.1 6.9 6.8  NEUTROABS 3.9  --   --   --  HGB 6.3* 8.3* 7.9* 7.8*  HCT 21.0* 28.2* 27.1* 26.8*  MCV 78.1 82.0 82.6 83.0  PLT 545* 570* 464* 123XX123*   Basic Metabolic Panel:  Recent Labs Lab 02/01/16 1525 02/02/16 0556 02/02/16 1819 02/03/16 0051 02/03/16 0719  NA 131* 133* 129* 131* 132*  K 4.2 4.3 4.4 4.2 4.3  CL 105 109 103 104 107  CO2 19* 16* 18* 20* 17*  GLUCOSE 135* 71 106* 120* 76  BUN 18 11 8 7 6   CREATININE 1.18* 0.77 0.65 0.73 0.66  CALCIUM 8.4* 8.3* 8.2* 8.1* 8.3*   GFR: Estimated Creatinine Clearance: 66.8 mL/min  (by C-G formula based on SCr of 0.66 mg/dL). Liver Function Tests:  Recent Labs Lab 02/01/16 0400 02/02/16 0556 02/02/16 1819  AST 19 20 15   ALT 11* 10* 9*  ALKPHOS 101 86 86  BILITOT 0.3 0.5 0.2*  PROT 6.8 5.7* 5.9*  ALBUMIN 3.5 2.8* 2.7*    Recent Labs Lab 02/02/16 1819  LIPASE 40   No results for input(s): AMMONIA in the last 168 hours. Coagulation Profile: No results for input(s): INR, PROTIME in the last 168 hours. Cardiac Enzymes:  Recent Labs Lab 02/01/16 0400  TROPONINI <0.03   BNP (last 3 results) No results for input(s): PROBNP in the last 8760 hours. HbA1C:  Recent Labs  02/02/16 1819  HGBA1C 4.9   CBG: No results for input(s): GLUCAP in the last 168 hours. Lipid Profile: No results for input(s): CHOL, HDL, LDLCALC, TRIG, CHOLHDL, LDLDIRECT in the last 72 hours. Thyroid Function Tests: No results for input(s): TSH, T4TOTAL, FREET4, T3FREE, THYROIDAB in the last 72 hours. Anemia Panel:  Recent Labs  02/01/16 0656  VITAMINB12 1,085*  FOLATE 12.4  FERRITIN 19  TIBC 357  IRON 11*  RETICCTPCT 0.8   Sepsis Labs:  Recent Labs Lab 02/01/16 0405  LATICACIDVEN 1.25    Recent Results (from the past 240 hour(s))  MRSA PCR Screening     Status: None   Collection Time: 02/01/16  2:02 PM  Result Value Ref Range Status   MRSA by PCR NEGATIVE NEGATIVE Final    Comment:        The GeneXpert MRSA Assay (FDA approved for NASAL specimens only), is one component of a comprehensive MRSA colonization surveillance program. It is not intended to diagnose MRSA infection nor to guide or monitor treatment for MRSA infections.          Radiology Studies: No results found.      Scheduled Meds: . sodium chloride   Intravenous Once  . feeding supplement  1 Container Oral TID BM  . gabapentin  300 mg Oral Daily  . pantoprazole  40 mg Oral Daily  . sodium chloride flush  3 mL Intravenous Q12H  . traMADol  50 mg Oral Once   Continuous  Infusions:    LOS: 2 days     Cordelia Poche [facility] 02/03/2016, 2:36 PM Pager: [phone]  If 7PM-7AM, please contact night-coverage [email or web address] Password [email or web address] 02/03/2016, 2:36 PM

## 2016-02-03 NOTE — Progress Notes (Signed)
Upon entering pt room during rounds she is always asleep but when she awakes, she asks about her pain medications.

## 2016-02-04 ENCOUNTER — Inpatient Hospital Stay (HOSPITAL_COMMUNITY): Payer: Medicare Other

## 2016-02-04 LAB — BASIC METABOLIC PANEL
Anion gap: 5 (ref 5–15)
BUN: 6 mg/dL (ref 6–20)
CO2: 20 mmol/L — ABNORMAL LOW (ref 22–32)
Calcium: 7.7 mg/dL — ABNORMAL LOW (ref 8.9–10.3)
Chloride: 106 mmol/L (ref 101–111)
Creatinine, Ser: 0.6 mg/dL (ref 0.44–1.00)
GFR calc Af Amer: >60 mL/min (ref >60)
GFR calc non Af Amer: >60 mL/min (ref >60)
Glucose, Bld: 77 mg/dL (ref 65–99)
Potassium: 4.7 mmol/L (ref 3.5–5.1)
Sodium: 131 mmol/L — ABNORMAL LOW (ref 135–145)

## 2016-02-04 LAB — OCCULT BLOOD X 1 CARD TO LAB, STOOL: Fecal Occult Bld: NEGATIVE

## 2016-02-04 LAB — CBC
HCT: 22.4 % — ABNORMAL LOW (ref 36.0–46.0)
HCT: 32.1 % — ABNORMAL LOW (ref 36.0–46.0)
Hemoglobin: 6.8 g/dL — CL (ref 12.0–15.0)
Hemoglobin: 10.1 g/dL — ABNORMAL LOW (ref 12.0–15.0)
MCH: 24.7 pg — ABNORMAL LOW (ref 26.0–34.0)
MCH: 25.4 pg — ABNORMAL LOW (ref 26.0–34.0)
MCHC: 30.4 g/dL (ref 30.0–36.0)
MCHC: 31.5 g/dL (ref 30.0–36.0)
MCV: 81.5 fL (ref 78.0–100.0)
MCV: 80.9 fL (ref 78.0–100.0)
Platelets: 379 10*3/uL (ref 150–400)
Platelets: 350 10*3/uL (ref 150–400)
RBC: 2.75 MIL/uL — ABNORMAL LOW (ref 3.87–5.11)
RBC: 3.97 MIL/uL (ref 3.87–5.11)
RDW: 21.8 % — ABNORMAL HIGH (ref 11.5–15.5)
RDW: 19.5 % — ABNORMAL HIGH (ref 11.5–15.5)
WBC: 5.3 10*3/uL (ref 4.0–10.5)
WBC: 5.8 10*3/uL (ref 4.0–10.5)

## 2016-02-04 LAB — PREPARE RBC (CROSSMATCH)

## 2016-02-04 MED ORDER — OXYMETAZOLINE HCL 0.05 % NA SOLN
1.0000 | Freq: Two times a day (BID) | NASAL | Status: AC
  Administered 2016-02-04 (×2): 1 via NASAL
  Filled 2016-02-04: qty 15

## 2016-02-04 MED ORDER — SUCRALFATE 1 G PO TABS
1.0000 g | ORAL_TABLET | Freq: Three times a day (TID) | ORAL | Status: DC
  Administered 2016-02-04 – 2016-02-05 (×6): 1 g via ORAL
  Filled 2016-02-04 (×6): qty 1

## 2016-02-04 MED ORDER — IOPAMIDOL (ISOVUE-300) INJECTION 61%
15.0000 mL | INTRAVENOUS | Status: AC
  Administered 2016-02-04 (×2): 15 mL via ORAL

## 2016-02-04 MED ORDER — OXYCODONE-ACETAMINOPHEN 5-325 MG PO TABS
2.0000 | ORAL_TABLET | Freq: Four times a day (QID) | ORAL | Status: DC | PRN
  Administered 2016-02-04 – 2016-02-05 (×7): 2 via ORAL
  Filled 2016-02-04 (×7): qty 2

## 2016-02-04 MED ORDER — SODIUM CHLORIDE 0.9 % IV SOLN
510.0000 mg | INTRAVENOUS | Status: AC
  Administered 2016-02-04: 510 mg via INTRAVENOUS
  Filled 2016-02-04 (×2): qty 17

## 2016-02-04 MED ORDER — SODIUM CHLORIDE 0.9 % IV SOLN
Freq: Once | INTRAVENOUS | Status: AC
  Administered 2016-02-04: 12:00:00 via INTRAVENOUS

## 2016-02-04 MED ORDER — IOPAMIDOL (ISOVUE-300) INJECTION 61%
INTRAVENOUS | Status: AC
  Administered 2016-02-04: 100 mL
  Filled 2016-02-04: qty 100

## 2016-02-04 MED ORDER — ADULT MULTIVITAMIN W/MINERALS CH
1.0000 | ORAL_TABLET | Freq: Every day | ORAL | Status: DC
  Administered 2016-02-04 – 2016-02-05 (×2): 1 via ORAL
  Filled 2016-02-04 (×2): qty 1

## 2016-02-04 MED ORDER — THIAMINE HCL 100 MG/ML IJ SOLN
100.0000 mg | Freq: Every day | INTRAMUSCULAR | Status: DC
  Administered 2016-02-04 – 2016-02-05 (×2): 100 mg via INTRAVENOUS
  Filled 2016-02-04 (×2): qty 2

## 2016-02-04 MED ORDER — OXYMETAZOLINE HCL 0.05 % NA SOLN
1.0000 | Freq: Two times a day (BID) | NASAL | Status: DC | PRN
  Administered 2016-02-05: 1 via NASAL

## 2016-02-04 MED ORDER — PANTOPRAZOLE SODIUM 40 MG PO TBEC
40.0000 mg | DELAYED_RELEASE_TABLET | Freq: Two times a day (BID) | ORAL | Status: DC
  Administered 2016-02-04 – 2016-02-05 (×4): 40 mg via ORAL
  Filled 2016-02-04 (×4): qty 1

## 2016-02-04 MED ORDER — SODIUM CHLORIDE 0.9 % IV SOLN
510.0000 mg | INTRAVENOUS | Status: DC
  Filled 2016-02-04: qty 17

## 2016-02-04 NOTE — Progress Notes (Signed)
CRITICAL VALUE ALERT  Critical value received:  hgb-6.8  Date of notification:  02/04/16  Time of notification: 0740  Critical value read back:Yes.    Nurse who received alert:  Larose Kells, RN  MD notified (1st Dunkleberger):  GRUNZ,MD  Time of first [place]:  0740  MD notified (2nd Spinney):  Time of second Sk[place]:  Responding MD: Claris Pong  Time MD responded:  [phone]

## 2016-02-04 NOTE — Care Management Note (Signed)
Case Management Note  Patient Details  Name: KYSHIA DONZE MRN: [id number] Date of Birth: 1965/09/03  Subjective/Objective:        Admitted with dizziness, weakness, pmh of gastric bypass surgery, migraines and fibromyalgia. Independent with ADL's PTA, no DME usage.      PCP: Post hospital follow up appointment scheduled on 02/09/2016 at 10am with [name] Caldron NP.  Action/Plan: Per PT's recommendation: HHPT. CM spoke with pt and choice list provided.Pt agreed with [facility]. CM requested order for HHPT from MD.  Expected Discharge Date:                  Expected Discharge Plan:  Home/Self Care  In-House Referral:     Discharge planning Services  CM Consult  Post Acute Care Choice:    Choice offered to:     DME Arranged:   PT/ referral made with  Butch Penny, [phone]. DME Agency:   [facility]  HH Arranged:    HH Agency:     Status of Service:  In process, will continue to follow  If discussed at Long Length of Stay Meetings, dates discussed:    Additional Comments:  Sharin Mons, RN 02/04/2016, 10:57 AM

## 2016-02-04 NOTE — Progress Notes (Signed)
Nutrition Follow-up  DOCUMENTATION CODES:   Severe malnutrition in context of acute illness/injury  INTERVENTION:    Continue Boost Breeze po TID, each supplement provides 250 kcal and 9 grams of protein  Continue snacks TID between meals.  Continue lactaid with meals.  NUTRITION DIAGNOSIS:   Malnutrition related to acute illness as evidenced by severe depletion of muscle mass, severe depletion of body fat, percent weight loss.  Ongoing  GOAL:   Patient will meet greater than or equal to 90% of their needs  Progressing  MONITOR:   PO intake, Supplement acceptance, Labs, Weight trends, I & O's  REASON FOR ASSESSMENT:   Consult Assessment of nutrition requirement/status  ASSESSMENT:   50 yo F with a pmhx significant for gastric bypass surgery who presents with dizziness and weakness.  RD received another consult. Noted full assessment completed on 02/02/16.   Spoke with pt at bedside. She reports her PO intake has improved since last visit. Per her report, she consumed 100% of dinner last night (meatloaf and mashed potatoes). Nutritional services staff have been assisting her with menu selections; per pt, she is ordering soft foods such as mashed potatoes, meat loaf, and soft salad sandwiches, due to missing teeth. She has also been selecting food items such as yogurt and pudding to increase protein intake. Due to early satiety, pt shares it takes her longer to consume meal trays, but often keeps them until next meal period so she can finish the majority of her food.   Pt confirms that she is accepting the Boost Breeze supplements; prefers the [employer] and [employer].   Pt complains of frequent loose stools today, which she attributes to bowel prep.   Discussed importance of good PO intake and supplements to promote healing. Pt understanding of nutrition plan of care and appreciative of RD visit. RD will continue to monitor.   Labs reviewed: Na: 131 (on IV  supplementation).  Diet Order:  Diet regular Room service appropriate? Yes; Fluid consistency: Thin  Skin:  Reviewed, no issues  Last BM:  02/04/16  Height:   Ht Readings from Last 1 Encounters:  02/02/16 5' (1.524 m)    Weight:   Wt Readings from Last 1 Encounters:  02/02/16 120 lb 11.2 oz (54.7 kg)    Ideal Body Weight:  45.5 kg  BMI:  Body mass index is 23.57 kg/m.  Estimated Nutritional Needs:   Kcal:  1600-1800  Protein:  80-100 gm  Fluid:  1.6-1.8 L  EDUCATION NEEDS:   Education needs addressed  Aleksa Catterton A. Jimmye Norman, RD, LDN, CDE Pager: [phone] After hours Pager: [phone]

## 2016-02-04 NOTE — Progress Notes (Signed)
PROGRESS NOTE    Dorella Mihalovich Gielow  [id number] DOB: Feb 16, 1966 DOA: 02/01/2016 PCP: Ricke Hey, MD  Brief Narrative: Angie [name] is a 50 y.o. with a history of gastric bypass surgery, anemia, fibromyalgia, migraines, and anxiety that presented with dizziness and weakness. She was found to be hypotensive, BP 84/47, HR 83, SpO2 95% on room air. Hgb 6.3. Creatinine 2.5 (baseline presumed 0.57), Na 124. She remained hypotensive after 3L NS bolus. She was admitted to the [facility]MT[facility], NS continued at 125cc/hr, and transfused 1u PRBCs with improvement in BP. Lactate 1.25. Iron studies showed iron deficiency, retic count inadequate.    Assessment & Plan:   Active Problems:   Arthritis   S/P gastric bypass   Anemia   Hypotension   Hyponatremia   Acute kidney injury ([place])  Severe malnutrition: Likely at the center of presentation. Poor po in addition to malabsorption inherent with gastric bypass caused hyponatremia, hypotension, dehydration. Severity of iron deficiency argues against adherence to vitamin regimen.  - Nutrition following, protein supplementation recommended and ordered.  - Starting MVM with thiamine, Fe - PT eval  Iron deficiency anemia: LDH normal, Fe 11, Ferritin 19, Retic count normal in setting of significant anemia. Hypochromic. B12 and folate normal - Transfuse this AM for hgb 6.8 and prn hgb <7. Recheck post-transfusion CBC. - Will give feraheme here and have her scheduled for hematology follow up. - With abdominal pain in pt s/p bypass will get CT abd/pelvis. - Check FOBT. Start carafate, PPI BID for occult bleeding - If scan negative, with hgb continuing to trend down (8.3 > 7.9 > 7.8 > 6.8), will ask for GI evaluation.  Hypovolemia causing hypotension and hyponatremia: Appears secondary to diuretic use (patient reported) and dehydration. AM cortisol normal. Patient is also very malnourished and this is likely contributing.  - Restarted IVF, will continue given  ongoing poor po and intermittent hypotension. AKI resolved.  - I/O  Ear discomfort: With documented effusion without evidence of infection. - Trial afrin  Hyponatremia: Hypovolemic. cortisol wnl. Improved with IVF, mild.  - Trend BMP.  Epigastric pain Lipase  Chronic pain: Pt on multiple opioids at home for fibromyalgia?  - Restart single pain medication  - Hold NSAIDs  DVT prophylaxis: SCDs Code Status: Full code Family Communication: None at bedside Disposition Plan: Discharge home vs SNF  Consultants:   None  Procedures:   None  Antimicrobials:  None   Subjective: Predominant complaint is chronic pain. Also with congestion and left ear/headache. No fevers, cough, sore throat. Reports chronic diffuse abdominal pain. with some nausea. No emesis. No blood in stool.  Objective: Vitals:   02/04/16 0445 02/04/16 1118 02/04/16 1145 02/04/16 1443  BP: (!) 102/58 102/70 (!) 93/59 139/78  Pulse: 66 79 73 62  Resp: 18 17 18    Temp: 97.6 F (36.4 C) 97.8 F (36.6 C) 97.7 F (36.5 C) 98.1 F (36.7 C)  TempSrc:   Oral Oral  SpO2: 100% 100% 100% 100%  Weight:      Height:        Intake/Output Summary (Last 24 hours) at 02/04/16 1445 Last data filed at 02/04/16 1435  Gross per 24 hour  Intake           646.67 ml  Output             1350 ml  Net          -703.33 ml   Filed Weights   02/01/16 0344 02/02/16 1256  Weight:  56.7 kg (125 lb) 54.7 kg (120 lb 11.2 oz)    Examination:  General exam: Nearly emaciated-appearing 50yo F appearing older than stated age calm and comfortable. Respiratory system: Nonlabored, clear without crackles bilaterally. Cardiovascular system: S1 & S2 heard, RRR. No murmurs, rubs, gallops or clicks. Gastrointestinal system: +BS, soft, diffusely tender, nondistended. No organomegaly or masses felt.  Central nervous system: Alert and oriented. No focal neurological deficits. Extremities: No edema. No calf tenderness Skin: No cyanosis. No  rashes Psychiatry: Judgement and insight appear normal. Mood & affect appropriate.   Data Reviewed: I have personally reviewed following labs and imaging studies  CBC:  Recent Labs Lab 02/01/16 0400 02/01/16 1525 02/02/16 0556 02/03/16 0719 02/04/16 0532  WBC 5.6 7.1 6.9 6.8 5.3  NEUTROABS 3.9  --   --   --   --   HGB 6.3* 8.3* 7.9* 7.8* 6.8*  HCT 21.0* 28.2* 27.1* 26.8* 22.4*  MCV 78.1 82.0 82.6 83.0 81.5  PLT 545* 570* 464* 426* [id number]   Basic Metabolic Panel:  Recent Labs Lab 02/02/16 0556 02/02/16 1819 02/03/16 0051 02/03/16 0719 02/04/16 0532  NA 133* 129* 131* 132* 131*  K 4.3 4.4 4.2 4.3 4.7  CL 109 103 104 107 106  CO2 16* 18* 20* 17* 20*  GLUCOSE 71 106* 120* 76 77  BUN 11 8 7 6 6   CREATININE 0.77 0.65 0.73 0.66 0.60  CALCIUM 8.3* 8.2* 8.1* 8.3* 7.7*   GFR: Estimated Creatinine Clearance: 66.8 mL/min (by C-G formula based on SCr of 0.6 mg/dL). Liver Function Tests:  Recent Labs Lab 02/01/16 0400 02/02/16 0556 02/02/16 1819  AST 19 20 15   ALT 11* 10* 9*  ALKPHOS 101 86 86  BILITOT 0.3 0.5 0.2*  PROT 6.8 5.7* 5.9*  ALBUMIN 3.5 2.8* 2.7*    Recent Labs Lab 02/02/16 1819  LIPASE 40   No results for input(s): AMMONIA in the last 168 hours. Coagulation Profile: No results for input(s): INR, PROTIME in the last 168 hours. Cardiac Enzymes:  Recent Labs Lab 02/01/16 0400  TROPONINI <0.03   BNP (last 3 results) No results for input(s): PROBNP in the last 8760 hours. HbA1C:  Recent Labs  02/02/16 1819  HGBA1C 4.9   CBG: No results for input(s): GLUCAP in the last 168 hours. Lipid Profile: No results for input(s): CHOL, HDL, LDLCALC, TRIG, CHOLHDL, LDLDIRECT in the last 72 hours. Thyroid Function Tests: No results for input(s): TSH, T4TOTAL, FREET4, T3FREE, THYROIDAB in the last 72 hours. Anemia Panel: No results for input(s): VITAMINB12, FOLATE, FERRITIN, TIBC, IRON, RETICCTPCT in the last 72 hours. Sepsis Labs:  Recent Labs Lab  02/01/16 0405  LATICACIDVEN 1.25    Recent Results (from the past 240 hour(s))  MRSA PCR Screening     Status: None   Collection Time: 02/01/16  2:02 PM  Result Value Ref Range Status   MRSA by PCR NEGATIVE NEGATIVE Final    Comment:        The GeneXpert MRSA Assay (FDA approved for NASAL specimens only), is one component of a comprehensive MRSA colonization surveillance program. It is not intended to diagnose MRSA infection nor to guide or monitor treatment for MRSA infections.     Radiology Studies: No results found.  Scheduled Meds: . feeding supplement  1 Container Oral TID BM  . ferumoxytol  510 mg Intravenous NOW  . gabapentin  300 mg Oral Daily  . multivitamin with minerals  1 tablet Oral Daily  . oxymetazoline  1 spray  Each Nare BID  . pantoprazole  40 mg Oral BID  . sodium chloride flush  3 mL Intravenous Q12H  . sucralfate  1 g Oral TID WC & HS  . thiamine injection  100 mg Intravenous Daily   Continuous Infusions: . sodium chloride 100 mL/hr at 02/04/16 0935    LOS: 3 days   Vance Gather, MD [facility] 02/04/2016, 2:45 PM Pager: [phone]  If 7PM-7AM, please contact night-coverage [email or web address] Password [email or web address] 02/04/2016, 2:45 PM

## 2016-02-05 LAB — TYPE AND SCREEN
ABO/RH(D): B POS
Antibody Screen: NEGATIVE
Unit division: 0
Unit division: 0
Unit division: 0
Unit division: 0

## 2016-02-05 MED ORDER — PANTOPRAZOLE SODIUM 40 MG PO TBEC: 40.0000 mg | DELAYED_RELEASE_TABLET | Freq: Two times a day (BID) | ORAL | 0 refills | Status: DC

## 2016-02-05 MED ORDER — VITAMIN B-1 100 MG PO TABS: 100.0000 mg | ORAL_TABLET | Freq: Every day | ORAL | Status: DC

## 2016-02-05 MED ORDER — ADULT MULTIVITAMIN W/MINERALS CH: 1.0000 | ORAL_TABLET | Freq: Every day | ORAL | 0 refills | Status: DC

## 2016-02-05 MED ORDER — OXYMETAZOLINE HCL 0.05 % NA SOLN
1.0000 | Freq: Two times a day (BID) | NASAL | 0 refills | Status: DC | PRN
Start: 2016-02-05 — End: 2016-07-21

## 2016-02-05 NOTE — Care Management Important Message (Signed)
Important Message  Patient Details  Name: Angie [name] MRN: [id number] Date of Birth: 1965-09-23   Medicare Important Message Given:  Yes    Nathen May 02/05/2016, 9:51 AM

## 2016-02-05 NOTE — Progress Notes (Signed)
Physical Therapy Treatment Patient Details Name: EMILIN MAPES MRN: [id number] DOB: 06/15/1965 Today's Date: 02/05/2016    History of Present Illness pt is a 50 y/o female with pmh of gastric bypass surgery, migraines and fibromyalgia, admitted with dizziness and weakness.    PT Comments    Improving steadily.  Will benefit nicely from HHPT to improve strength, stamina and to help her monitor progress.   Follow Up Recommendations  Home health PT     Equipment Recommendations  None recommended by PT    Recommendations for Other Services       Precautions / Restrictions Precautions Precautions: Fall    Mobility  Bed Mobility Overal bed mobility: Modified Independent                Transfers Overall transfer level: Needs assistance   Transfers: Sit to/from Stand Sit to Stand: Supervision            Ambulation/Gait Ambulation/Gait assistance: Min assist Ambulation Distance (Feet): 160 Feet Assistive device: 1 person hand held assist Gait Pattern/deviations: Step-through pattern   Gait velocity interpretation: Below normal speed for age/gender General Gait Details: still mildly weak and unsteady, but improved over eval with blood work showing improvements.   Stairs            Wheelchair Mobility    Modified Rankin (Stroke Patients Only)       Balance     Sitting balance-Leahy Scale: Good       Standing balance-Leahy Scale: Fair                      Cognition Arousal/Alertness: Awake/alert Behavior During Therapy: WFL for tasks assessed/performed Overall Cognitive Status: Within Functional Limits for tasks assessed                      Exercises      General Comments        Pertinent Vitals/Pain Pain Assessment: Faces Faces Pain Scale: Hurts a little bit Pain Location: joints Pain Descriptors / Indicators: Aching Pain Intervention(s): Monitored during session;Repositioned    Home Living                       Prior Function            PT Goals (current goals can now be found in the care plan section) Acute Rehab PT Goals Patient Stated Goal: Get stronger, get some of this weight back and get my teeth fixed. PT Goal Formulation: With patient Time For Goal Achievement: 02/17/16 Potential to Achieve Goals: Good Progress towards PT goals: Progressing toward goals    Frequency    Min 3X/week      PT Plan Current plan remains appropriate    Co-evaluation             End of Session   Activity Tolerance: Patient limited by fatigue Patient left: in bed;with call bell/phone within reach     Time: 1700-1715 PT Time Calculation (min) (ACUTE ONLY): 15 min  Charges:  $Gait Training: 8-22 mins                    G Codes:      Belmira Daley, Tessie Fass 02/05/2016, 5:19 PM 02/05/2016  Donnella Sham, PT [phone]  (pager)

## 2016-02-05 NOTE — Discharge Summary (Signed)
Physician Discharge Summary  Angie [name] [id number] DOB: [birth year] DOA: 02/01/2016  PCP: Ricke Hey, MD  Admit date: 02/01/2016 Discharge date: 02/05/2016  Admitted From: Home Disposition: Home   Recommendations for Outpatient Follow-up:  1. Follow up with PCP in 1-2 weeks 2. Monitor blood pressure. Advised to STOP taking benazepril and norvasc until follow up 3. Stopped NSAIDs 4. Protonix started for abdominal pain. Consider GI evaluation as outpatient.  5. Severe iron deficiency: Counseled to take multivitamins, B12, Fe daily. Given IV iron x1 and set up follow up with the cancer center for heme follow up.   Home Health: HH-PT Equipment/Devices: None Discharge Condition: Stable CODE STATUS: Full Diet recommendation: High iron  Brief/Interim Summary: Angie [name] is a 50 y.o. with a history of gastric bypass surgery, anemia, fibromyalgia, migraines, and anxiety that presented with dizziness and weakness. She was found to be hypotensive, BP 84/47, HR 83, SpO2 95% on room air. Hgb 6.3. Creatinine 2.5 (baseline presumed 0.57), Na 124. She remained hypotensive after 3L NS bolus. She was admitted to the [facility]MTS, NS continued at 125cc/hr, and transfused 1u PRBCs with improvement in BP. Lactate 1.25. Iron studies showed iron deficiency, retic count inadequate. IV iron was given. CT abdomen and pelvis showed colonic distention with air-fluid levels without inflammation, though patient has had regular bowel movements. No worrisome findings regarding post-surgical complications. Gallbladder was distended without stones. Protonix was started with improvement in pain. She's been eating a full diet, improving po drastically from what she had been eating at home. Discharge hgb 10.1, and clinically she feels completely improved.   Discharge Diagnoses:  Active Problems:   Arthritis   S/P gastric bypass   Anemia   Hypotension   Hyponatremia   Acute kidney injury ([place])  Severe  malnutrition: Likely at the center of presentation. Poor po in addition to malabsorption inherent with gastric bypass caused hyponatremia, hypotension, dehydration. Severity of iron deficiency argues against adherence to vitamin regimen.  - Nutrition following, protein supplementation recommended and ordered.  - Starting MVM with thiamine, Fe - PT eval  Iron deficiency anemia: LDH normal, Fe 11, Ferritin 19, Retic count normal in setting of significant anemia. Hypochromic. B12 and folate normal - Transfused pr hgb < 7, 10.1 today. - Will give feraheme here and have her scheduled for hematology follow up. - CT abd/pelvis without significant findings. FOBT has been negative.  - Continue PPI  Hypovolemia causing hypotension and hyponatremia: Appears secondary to diuretic use (patient reported) and dehydration. AM cortisol normal. Patient is also very malnourished and this is likely contributing.  - AKI resolved, D/C IVF as po is improved. - I/O  Ear discomfort: With documented effusion without evidence of infection. - Trial afrin  Hyponatremia: Hypovolemic. cortisol wnl. Improved with IVF, mild.  - Trend BMP.  Chronic pain: Pt on multiple opioids at home for fibromyalgia?  - Restart single pain medication  - Hold NSAIDs  Discharge Instructions Discharge Instructions    Diet - low sodium heart healthy    Complete by:  As directed    Discharge instructions    Complete by:  As directed    You were admitted for low blood pressure and anemia.  - To improve your blood pressure, STOP taking benazepril and norvasc until you follow up with your doctor - Stop taking ibuprofen and all NSAIDs (motrin, advil, aleve, goody's, etc.) because these may hurt your kidneys. Continue taking your other home pain medications.  - To help with abdominal pain, protonix  has been prescribed because this seemed to help in the hospital. Continue this until you follow up with your doctor. - To help with ear  pain/fullness/echo sounds, you are being given a prescription for afrin nasal spray. You should only use this for 3 days at a time because it can worsen symptoms if used longer than that. This is also available over the counter - Make sure you are taking a multivitamin every day, vitamin B12 every day, and iron every day.  - You will be contacted by the cancer center to schedule an appointment to follow up there for your anemia. Your hemoglobin level was 10 at discharge and there was no evidence of bleeding in your GI system.  - Make sure you follow up with your PCP soon after discharge, and return if your symptoms worsen or return.   Increase activity slowly    Complete by:  As directed        Medication List    STOP taking these medications   amLODipine 10 MG tablet Commonly known as:  NORVASC   benazepril 20 MG tablet Commonly known as:  LOTENSIN   ibuprofen 800 MG tablet Commonly known as:  ADVIL,MOTRIN     TAKE these medications   ALPRAZolam 1 MG tablet Commonly known as:  XANAX Take 1 mg by mouth 3 (three) times daily.   ferrous sulfate 325 (65 FE) MG tablet Take 325 mg by mouth 3 (three) times daily.   gabapentin 300 MG capsule Commonly known as:  NEURONTIN Take 1 capsule (300 mg total) by mouth 3 (three) times daily.   lidocaine 5 % ointment Commonly known as:  XYLOCAINE Apply 1 application topically 2 (two) times daily as needed for moderate pain.   multivitamin with minerals Tabs tablet Take 1 tablet by mouth daily. Start taking on:  02/06/2016   OVER THE COUNTER MEDICATION Take 1 tablet by mouth daily. Biotin   oxyCODONE-acetaminophen 10-325 MG tablet Commonly known as:  PERCOCET Take 1 tablet by mouth every 4 (four) hours as needed for pain. What changed:  Another medication with the same name was removed. Continue taking this medication, and follow the directions you see here.   oxymetazoline 0.05 % nasal spray Commonly known as:  AFRIN Place 1 spray  into both nostrils 2 (two) times daily as needed for congestion.   pantoprazole 40 MG tablet Commonly known as:  PROTONIX Take 1 tablet (40 mg total) by mouth 2 (two) times daily.   VITAMIN B12 PO Take 1 tablet by mouth daily.      Follow-up Information    [facility] [facility] .   Why:  You have been contacted for this appointment       [facility] .   Why:  Home health PT arranged Contact information: [place] [address] [place]. Go on 02/09/2016.   Why:    Post hospital follow up appointment scheduled on 02/09/2016 at 10am with [name] Caldron NP  Contact information: [address] [place] [phone]         No Known Allergies  Consultations:  None  Procedures/Studies: Ct Abdomen Pelvis W Contrast  Result Date: 02/04/2016 CLINICAL DATA:  Diffuse abdominal pain. Anemia. History of gastric bypass procedure. EXAM: CT ABDOMEN AND PELVIS WITH CONTRAST TECHNIQUE: Multidetector CT imaging of the abdomen and pelvis was performed using the standard protocol following  bolus administration of intravenous contrast. CONTRAST:  133mL ISOVUE-300 IOPAMIDOL (ISOVUE-300) INJECTION 61% COMPARISON:  None. FINDINGS: Lower chest: Clear lung bases.  Heart normal size. Hepatobiliary: Small low-density lesion in the lateral segment left lobe measuring 5 mm, likely a cyst. No other liver masses or lesions. Liver is otherwise unremarkable. Gallbladder is distended. No wall thickening or inflammation. Common bile duct is dilated to 8 mm with relatively abrupt distal tapering in the pancreatic head. No duct stone is seen. Pancreas: There is some patchy low attenuation in the pancreatic head neck and body consistent with partial fatty replacement. No pancreatic mass or inflammation. There is mild dilation of the pancreatic duct measuring 3 mm. Spleen: Normal in  size without focal abnormality. Adrenals/Urinary Tract: No adrenal masses. 12 mm cyst in the midpole the left kidney. No other renal masses, no stones no hydronephrosis. Normal ureters. Bladder is unremarkable. Stomach/Bowel: Changes from a gastric stapling procedure with formation of a gastrojejunostomy. No evidence of gastric outlet obstruction or wall thickening or inflammation. Small bowel is unremarkable. There is no colonic wall thickening or inflammation. There is mild distention of the colon with scattered air-fluid levels. Normal appendix is visualized. Vascular/Lymphatic: No significant vascular findings are present. No enlarged abdominal or pelvic lymph nodes. Reproductive: Small, 2 cm, submucosal fibroid arising from the uterine fundus. Uterus normal in overall size. No adnexal masses. Other: Abdominal hernia. No ascites. Mild diffuse subcutaneous edema. Musculoskeletal: Unremarkable IMPRESSION: 1. Mild distention of the colon with air-fluid levels, but no bowel wall thickening or inflammatory changes. Findings may reflect a mild adynamic ileus. No evidence of bowel obstruction. 2. There changes from a gastric stapling procedure and formation of a gastrojejunostomy. 3. Distended gallbladder. Mild dilation of the common bile duct. No evidence of a duct stone. No pancreatic mass. If there are symptoms suggesting biliary obstruction, follow-up MRCP artery ERCP would be recommended. Electronically Signed   By: Lajean Manes M.D.   On: 02/04/2016 14:52      Subjective: Pt has eaten without N/V. +BM daily, soft, brown. abd pain "completely better" this AM. No dyspnea, chest pain.   Discharge Exam: Vitals:   02/05/16 0014 02/05/16 0611  BP: 101/74 105/81  Pulse: 68 75  Resp: 18 18  Temp: 98.3 F (36.8 C) 98.2 F (36.8 C)   Vitals:   02/04/16 1552 02/04/16 1835 02/05/16 0014 02/05/16 0611  BP: 140/87 101/72 101/74 105/81  Pulse: 73 70 68 75  Resp: 16 17 18 18   Temp: 97.7 F (36.5 C) 97.7  F (36.5 C) 98.3 F (36.8 C) 98.2 F (36.8 C)  TempSrc: Axillary Oral Oral Oral  SpO2:  100% 100% 100%  Weight:      Height:       General: Pt is very thin, alert, awake, not in acute distress Cardiovascular: RRR, S1/S2 +, no rubs, no gallops Respiratory: CTA bilaterally, no wheezing, no rhonchi Abdominal: Soft, NT, ND, bowel sounds + Extremities: no edema, no cyanosis  The results of significant diagnostics from this hospitalization (including imaging, microbiology, ancillary and laboratory) are listed below for reference.    Microbiology: Recent Results (from the past 240 hour(s))  MRSA PCR Screening     Status: None   Collection Time: 02/01/16  2:02 PM  Result Value Ref Range Status   MRSA by PCR NEGATIVE NEGATIVE Final    Comment:        The GeneXpert MRSA Assay (FDA approved for NASAL specimens only), is one component of a comprehensive MRSA colonization  surveillance program. It is not intended to diagnose MRSA infection nor to guide or monitor treatment for MRSA infections.      Labs: BNP (last 3 results) No results for input(s): BNP in the last 8760 hours. Basic Metabolic Panel:  Recent Labs Lab 02/02/16 0556 02/02/16 1819 02/03/16 0051 02/03/16 0719 02/04/16 0532  NA 133* 129* 131* 132* 131*  K 4.3 4.4 4.2 4.3 4.7  CL 109 103 104 107 106  CO2 16* 18* 20* 17* 20*  GLUCOSE 71 106* 120* 76 77  BUN 11 8 7 6 6   CREATININE 0.77 0.65 0.73 0.66 0.60  CALCIUM 8.3* 8.2* 8.1* 8.3* 7.7*   Liver Function Tests:  Recent Labs Lab 02/01/16 0400 02/02/16 0556 02/02/16 1819  AST 19 20 15   ALT 11* 10* 9*  ALKPHOS 101 86 86  BILITOT 0.3 0.5 0.2*  PROT 6.8 5.7* 5.9*  ALBUMIN 3.5 2.8* 2.7*    Recent Labs Lab 02/02/16 1819  LIPASE 40   No results for input(s): AMMONIA in the last 168 hours. CBC:  Recent Labs Lab 02/01/16 0400 02/01/16 1525 02/02/16 0556 02/03/16 0719 02/04/16 0532 02/04/16 2112  WBC 5.6 7.1 6.9 6.8 5.3 5.8  NEUTROABS 3.9  --    --   --   --   --   HGB 6.3* 8.3* 7.9* 7.8* 6.8* 10.1*  HCT 21.0* 28.2* 27.1* 26.8* 22.4* 32.1*  MCV 78.1 82.0 82.6 83.0 81.5 80.9  PLT 545* 570* 464* 426* 379 350   Cardiac Enzymes:  Recent Labs Lab 02/01/16 0400  TROPONINI <0.03   BNP: Invalid input(s): POCBNP CBG: No results for input(s): GLUCAP in the last 168 hours. D-Dimer No results for input(s): DDIMER in the last 72 hours. Hgb A1c  Recent Labs  02/02/16 1819  HGBA1C 4.9   Lipid Profile No results for input(s): CHOL, HDL, LDLCALC, TRIG, CHOLHDL, LDLDIRECT in the last 72 hours. Thyroid function studies No results for input(s): TSH, T4TOTAL, T3FREE, THYROIDAB in the last 72 hours.  Invalid input(s): FREET3 Anemia work up No results for input(s): VITAMINB12, FOLATE, FERRITIN, TIBC, IRON, RETICCTPCT in the last 72 hours. Urinalysis    Component Value Date/Time   COLORURINE YELLOW 02/01/2016 [place] 02/01/2016 0614   LABSPEC <1.005 (L) 02/01/2016 0614   PHURINE 5.5 02/01/2016 0614   GLUCOSEU NEGATIVE 02/01/2016 0614   HGBUR NEGATIVE 02/01/2016 0614   BILIRUBINUR NEGATIVE 02/01/2016 0614   KETONESUR NEGATIVE 02/01/2016 0614   PROTEINUR NEGATIVE 02/01/2016 0614   UROBILINOGEN 0.2 10/26/2012 1115   NITRITE NEGATIVE 02/01/2016 0614   LEUKOCYTESUR NEGATIVE 02/01/2016 [id number]   Sepsis Labs Invalid input(s): PROCALCITONIN,  WBC,  LACTICIDVEN Microbiology Recent Results (from the past 240 hour(s))  MRSA PCR Screening     Status: None   Collection Time: 02/01/16  2:02 PM  Result Value Ref Range Status   MRSA by PCR NEGATIVE NEGATIVE Final    Comment:        The GeneXpert MRSA Assay (FDA approved for NASAL specimens only), is one component of a comprehensive MRSA colonization surveillance program. It is not intended to diagnose MRSA infection nor to guide or monitor treatment for MRSA infections.     Time coordinating discharge: Over 30 minutes  Vance Gather, MD  [facility] 02/05/2016, 1:39 PM Pager [phone]  If 7PM-7AM, please contact night-coverage [email or web address] Password [email or web address]

## 2016-02-09 ENCOUNTER — Inpatient Hospital Stay: Payer: Commercial Managed Care - HMO

## 2016-02-12 ENCOUNTER — Encounter (HOSPITAL_COMMUNITY): Payer: Self-pay

## 2016-02-12 ENCOUNTER — Emergency Department (HOSPITAL_COMMUNITY)
Admission: EM | Admit: 2016-02-12 | Discharge: 2016-02-12 | Disposition: A | Discharge status: Home / Self Care | Payer: Medicare Other | Attending: Emergency Medicine | Admitting: Emergency Medicine

## 2016-02-12 DIAGNOSIS — Z87891 Personal history of nicotine dependence: Secondary | ICD-10-CM | POA: Diagnosis not present

## 2016-02-12 DIAGNOSIS — K226 Gastro-esophageal laceration-hemorrhage syndrome: Secondary | ICD-10-CM

## 2016-02-12 DIAGNOSIS — K920 Hematemesis: Secondary | ICD-10-CM | POA: Diagnosis present

## 2016-02-12 LAB — CBC WITH DIFFERENTIAL/PLATELET
Basophils Absolute: 0.1 10*3/uL (ref 0.0–0.1)
Basophils Relative: 1 %
Eosinophils Absolute: 0.1 10*3/uL (ref 0.0–0.7)
Eosinophils Relative: 1 %
HCT: 32.7 % — ABNORMAL LOW (ref 36.0–46.0)
Hemoglobin: 10.3 g/dL — ABNORMAL LOW (ref 12.0–15.0)
Lymphocytes Relative: 13 %
Lymphs Abs: 1.1 10*3/uL (ref 0.7–4.0)
MCH: 25.4 pg — ABNORMAL LOW (ref 26.0–34.0)
MCHC: 31.5 g/dL (ref 30.0–36.0)
MCV: 80.7 fL (ref 78.0–100.0)
Monocytes Absolute: 0.3 10*3/uL (ref 0.1–1.0)
Monocytes Relative: 4 %
Neutro Abs: 6.8 10*3/uL (ref 1.7–7.7)
Neutrophils Relative %: 81 %
Platelets: 270 10*3/uL (ref 150–400)
RBC: 4.05 MIL/uL (ref 3.87–5.11)
RDW: 21.1 % — ABNORMAL HIGH (ref 11.5–15.5)
WBC: 8.4 10*3/uL (ref 4.0–10.5)

## 2016-02-12 LAB — COMPREHENSIVE METABOLIC PANEL
ALT: 16 U/L (ref 14–54)
AST: 19 U/L (ref 15–41)
Albumin: 3.6 g/dL (ref 3.5–5.0)
Alkaline Phosphatase: 77 U/L (ref 38–126)
Anion gap: 9 (ref 5–15)
BUN: 6 mg/dL (ref 6–20)
CO2: 22 mmol/L (ref 22–32)
Calcium: 9.2 mg/dL (ref 8.9–10.3)
Chloride: 110 mmol/L (ref 101–111)
Creatinine, Ser: 0.64 mg/dL (ref 0.44–1.00)
GFR calc Af Amer: >60 mL/min (ref >60)
GFR calc non Af Amer: >60 mL/min (ref >60)
Glucose, Bld: 80 mg/dL (ref 65–99)
Potassium: 3.9 mmol/L (ref 3.5–5.1)
Sodium: 141 mmol/L (ref 135–145)
Total Bilirubin: 0.3 mg/dL (ref 0.3–1.2)
Total Protein: 6.6 g/dL (ref 6.5–8.1)

## 2016-02-12 LAB — PROTIME-INR
INR: 1.06
Prothrombin Time: 13.8 seconds (ref 11.4–15.2)

## 2016-02-12 MED ORDER — FAMOTIDINE IN NACL 20-0.9 MG/50ML-% IV SOLN
20.0000 mg | Freq: Once | INTRAVENOUS | Status: AC
Start: 2016-02-12 — End: 2016-02-12
  Administered 2016-02-12: 20 mg via INTRAVENOUS
  Filled 2016-02-12: qty 50

## 2016-02-12 MED ORDER — MORPHINE SULFATE (PF) 4 MG/ML IV SOLN
4.0000 mg | Freq: Once | INTRAVENOUS | Status: AC
  Administered 2016-02-12: 4 mg via INTRAVENOUS
  Filled 2016-02-12: qty 1

## 2016-02-12 MED ORDER — SODIUM CHLORIDE 0.9 % IV BOLUS (SEPSIS)
1000.0000 mL | Freq: Once | INTRAVENOUS | Status: AC
  Administered 2016-02-12: 1000 mL via INTRAVENOUS

## 2016-02-12 MED ORDER — ONDANSETRON 4 MG PO TBDP: 4.0000 mg | ORAL_TABLET | Freq: Three times a day (TID) | ORAL | 0 refills | Status: DC | PRN

## 2016-02-12 MED ORDER — ONDANSETRON 4 MG PO TBDP
4.0000 mg | ORAL_TABLET | Freq: Once | ORAL | Status: AC
  Administered 2016-02-12: 4 mg via ORAL
  Filled 2016-02-12: qty 1

## 2016-02-12 NOTE — ED Notes (Signed)
Pt reports mid abd pain with nausea and hematemesis x 1 today.  States she was just discharged from the hospital x 2 days ago and never felt good since.  States she received 4 units of PRBC while in the hospital and her Hgb was 10 when she was sent home.  Pt is A&oO x 4.

## 2016-02-12 NOTE — ED Triage Notes (Signed)
Pt arrived via [facility] c/o hematemesis today.  [facility] D/[facility] days ago.  C/o abdominal pain and tenderness.

## 2016-02-12 NOTE — Discharge Instructions (Signed)
Clear liquids only next 24 hours.  Zofran for nausea.  Return to ER with any recurrence of your vomiting, or vomiting blood

## 2016-02-12 NOTE — ED Notes (Signed)
Assisted pt to the BR.  Pt ambulatory with steady gait

## 2016-02-12 NOTE — ED Provider Notes (Signed)
[facility] DEPT Provider Note   CSN: [id number] Arrival date & time: 02/12/16  1619     History   Chief Complaint Chief Complaint  Patient presents with  . Hematemesis    HPI Angie [name] is a 50 y.o. female.  She has a history of gastric bypass with Roux-en-Y anastomosis.  She presents with multiple episodes of emesis today. Her most recent episode she had some bright red blood. No additional vomiting since that time. Discharge 2 days ago after an admission for vomiting diarrhea abdominal pain dehydration.  Continues on proton pump inhibitor. Had blood transfusion for recent iron deficiency anemia. Continues on iron is not having dark or black stools. Not dizzy or lightheaded or syncopal today.  HPI  Past Medical History:  Diagnosis Date  . Abnormal Pap smear   . Anemia   . Anxiety    Takes xanax  . Arthritis    Has rx for percocet  . Bulging lumbar disc   . Chronic pain   . Fibromyalgia   . Migraines     Patient Active Problem List   Diagnosis Date Noted  . Hypotension 02/01/2016  . Hyponatremia 02/01/2016  . Acute kidney injury ([place]) 02/01/2016  . Subacromial impingement of left shoulder 07/06/2013  . Trochanteric bursitis of left hip 05/11/2013  . Anemia 07/24/2012  . Back pain complicating pregnancy [id number]  . AMA (advanced maternal age) multigravida 35+ 05/16/2012  . Insufficient prenatal care 05/16/2012  . Previous cesarean section 05/16/2012  . Arthritis 05/16/2012  . S/P gastric bypass 05/16/2012    Past Surgical History:  Procedure Laterality Date  . CERVICAL CONE BIOPSY    . CESAREAN SECTION     X 3  . CESAREAN SECTION WITH BILATERAL TUBAL LIGATION Bilateral 10/28/2012   Procedure: Repeat cesarean section with delivery of baby boy at 1210. Apgars 8/9.  BILATERAL TUBAL LIGATION;  Surgeon: Florian Buff, MD;  Location: [facility];  Service: Obstetrics;  Laterality: Bilateral;  . GASTRIC BYPASS  2007  . GASTRIC BYPASS      OB History    Gravida Para Term Preterm AB Living   6 4 4  0 2 4   SAB TAB Ectopic Multiple Live Births   2       4       Home Medications    Prior to Admission medications   Medication Sig Start Date End Date Taking? Authorizing Provider  ALPRAZolam Duanne Moron) 1 MG tablet Take 1 mg by mouth 3 (three) times daily.  04/28/13   Historical Provider, MD  Cyanocobalamin (VITAMIN B12 PO) Take 1 tablet by mouth daily.    Historical Provider, MD  ferrous sulfate 325 (65 FE) MG tablet Take 325 mg by mouth 3 (three) times daily.     Historical Provider, MD  gabapentin (NEURONTIN) 300 MG capsule Take 1 capsule (300 mg total) by mouth 3 (three) times daily. Patient not taking: Reported on 02/01/2016 07/06/13   Charlett Blake, MD  lidocaine (XYLOCAINE) 5 % ointment Apply 1 application topically 2 (two) times daily as needed for moderate pain.    Historical Provider, MD  Multiple Vitamin (MULTIVITAMIN WITH MINERALS) TABS tablet Take 1 tablet by mouth daily. 02/06/16   Patrecia Pour, MD  ondansetron (ZOFRAN ODT) 4 MG disintegrating tablet Take 1 tablet (4 mg total) by mouth every 8 (eight) hours as needed for nausea. 02/12/16   Tanna Furry, MD  OVER THE COUNTER MEDICATION Take 1 tablet by mouth daily. Biotin  Historical Provider, MD  oxyCODONE-acetaminophen (PERCOCET) 10-325 MG tablet Take 1 tablet by mouth every 4 (four) hours as needed for pain.    Historical Provider, MD  oxymetazoline (AFRIN) 0.05 % nasal spray Place 1 spray into both nostrils 2 (two) times daily as needed for congestion. 02/05/16   Patrecia Pour, MD  pantoprazole (PROTONIX) 40 MG tablet Take 1 tablet (40 mg total) by mouth 2 (two) times daily. 02/05/16   Patrecia Pour, MD    Family History Family History  Problem Relation Age of Onset  . Diabetes Father   . Depression Maternal Grandmother   . Heart disease Maternal Grandfather   . Depression Paternal Grandmother     Social History Social History  Substance Use Topics  . Smoking status:  Former Smoker    Packs/day: 0.50    Years: 10.00    Types: Cigarettes    Quit date: 03/16/2012  . Smokeless tobacco: Never Used  . Alcohol use 0.0 oz/week     Comment: socially     Allergies   Review of patient's allergies indicates no known allergies.   Review of Systems Review of Systems  Constitutional: Negative for appetite change, chills, diaphoresis, fatigue and fever.  HENT: Negative for mouth sores, sore throat and trouble swallowing.   Eyes: Negative for visual disturbance.  Respiratory: Negative for cough, chest tightness, shortness of breath and wheezing.   Cardiovascular: Negative for chest pain.  Gastrointestinal: Positive for abdominal pain, nausea and vomiting. Negative for abdominal distention and diarrhea.  Endocrine: Negative for polydipsia, polyphagia and polyuria.  Genitourinary: Negative for dysuria, frequency and hematuria.  Musculoskeletal: Negative for gait problem.  Skin: Negative for color change, pallor and rash.  Neurological: Negative for dizziness, syncope, light-headedness and headaches.  Hematological: Does not bruise/bleed easily.  Psychiatric/Behavioral: Negative for behavioral problems and confusion.     Physical Exam Updated Vital Signs BP (!) 182/103   Pulse (!) 57   SpO2 100%   Physical Exam  Constitutional: She is oriented to person, place, and time. She appears well-developed and well-nourished. No distress.  Awake and alert.  HENT:  Head: Normocephalic.  Eyes: Conjunctivae are normal. Pupils are equal, round, and reactive to light. No scleral icterus.  Conjunctiva not pale. No scleral icterus.  Neck: Normal range of motion. Neck supple. No thyromegaly present.  Cardiovascular: Normal rate and regular rhythm.  Exam reveals no gallop and no friction rub.   No murmur heard. Pulmonary/Chest: Effort normal and breath sounds normal. No respiratory distress. She has no wheezes. She has no rales.  Abdominal: Soft. Bowel sounds are  normal. She exhibits no distension. There is no tenderness. There is no rebound.  Abdomen soft. No guarding rebound or peritoneal rotation.  Musculoskeletal: Normal range of motion.  Neurological: She is alert and oriented to person, place, and time.  Skin: Skin is warm and dry. No rash noted.  Psychiatric: She has a normal mood and affect. Her behavior is normal.     ED Treatments / Results  Labs (all labs ordered are listed, but only abnormal results are displayed) Labs Reviewed  CBC WITH DIFFERENTIAL/PLATELET - Abnormal; Notable for the following:       Result Value   Hemoglobin 10.3 (*)    HCT 32.7 (*)    MCH 25.4 (*)    RDW 21.1 (*)    All other components within normal limits  COMPREHENSIVE METABOLIC PANEL  PROTIME-INR    EKG  EKG Interpretation None  Radiology No results found.  Procedures Procedures (including critical care time)  Medications Ordered in ED Medications  ondansetron (ZOFRAN-ODT) disintegrating tablet 4 mg (4 mg Oral Given 02/12/16 1744)  sodium chloride 0.9 % bolus 1,000 mL (1,000 mLs Intravenous New Bag/Given 02/12/16 1744)  famotidine (PEPCID) IVPB 20 mg premix (0 mg Intravenous Stopped 02/12/16 1843)  morphine 4 MG/ML injection 4 mg (4 mg Intravenous Given 02/12/16 1845)     Initial Impression / Assessment and Plan / ED Course  I have reviewed the triage vital signs and the nursing notes.  Pertinent labs & imaging results that were available during my care of the patient were reviewed by me and considered in my medical decision making (see chart for details).  Clinical Course    Antiemetics, Pepcid, pain medication, IV fluid. Her hemoglobin is stable from discharge.  It is improved after medications. Plan discharge home. Continue PPI. GI follow-up. Continue iron. Zofran for nausea. Push fluids stay hydrated.  Final Clinical Impressions(s) / ED Diagnoses   Final diagnoses:  Hematemesis, presence of nausea not specified    Mallory-Weiss tear    New Prescriptions New Prescriptions   ONDANSETRON (ZOFRAN ODT) 4 MG DISINTEGRATING TABLET    Take 1 tablet (4 mg total) by mouth every 8 (eight) hours as needed for nausea.     Tanna Furry, MD 02/12/16 1902

## 2016-04-22 ENCOUNTER — Observation Stay (HOSPITAL_COMMUNITY): Payer: Medicare Other

## 2016-04-22 ENCOUNTER — Inpatient Hospital Stay (HOSPITAL_COMMUNITY)
Admission: EM | Admit: 2016-04-22 | Discharge: 2016-04-27 | DRG: 637 | Disposition: A | Discharge status: Home / Self Care | Payer: Medicare Other | Attending: Internal Medicine | Admitting: Internal Medicine

## 2016-04-22 ENCOUNTER — Emergency Department (HOSPITAL_COMMUNITY): Payer: Medicare Other

## 2016-04-22 ENCOUNTER — Encounter (HOSPITAL_COMMUNITY): Payer: Self-pay | Admitting: Adult Health

## 2016-04-22 DIAGNOSIS — E1143 Type 2 diabetes mellitus with diabetic autonomic (poly)neuropathy: Secondary | ICD-10-CM | POA: Diagnosis present

## 2016-04-22 DIAGNOSIS — R110 Nausea: Secondary | ICD-10-CM

## 2016-04-22 DIAGNOSIS — Z87891 Personal history of nicotine dependence: Secondary | ICD-10-CM

## 2016-04-22 DIAGNOSIS — Z7289 Other problems related to lifestyle: Secondary | ICD-10-CM

## 2016-04-22 DIAGNOSIS — E162 Hypoglycemia, unspecified: Secondary | ICD-10-CM | POA: Diagnosis present

## 2016-04-22 DIAGNOSIS — E46 Unspecified protein-calorie malnutrition: Secondary | ICD-10-CM

## 2016-04-22 DIAGNOSIS — E876 Hypokalemia: Secondary | ICD-10-CM | POA: Diagnosis present

## 2016-04-22 DIAGNOSIS — R1013 Epigastric pain: Secondary | ICD-10-CM

## 2016-04-22 DIAGNOSIS — F419 Anxiety disorder, unspecified: Secondary | ICD-10-CM | POA: Diagnosis present

## 2016-04-22 DIAGNOSIS — R404 Transient alteration of awareness: Secondary | ICD-10-CM

## 2016-04-22 DIAGNOSIS — R55 Syncope and collapse: Secondary | ICD-10-CM

## 2016-04-22 DIAGNOSIS — E11649 Type 2 diabetes mellitus with hypoglycemia without coma: Secondary | ICD-10-CM | POA: Diagnosis not present

## 2016-04-22 DIAGNOSIS — K9589 Other complications of other bariatric procedure: Secondary | ICD-10-CM | POA: Diagnosis present

## 2016-04-22 DIAGNOSIS — G894 Chronic pain syndrome: Secondary | ICD-10-CM | POA: Diagnosis present

## 2016-04-22 DIAGNOSIS — E43 Unspecified severe protein-calorie malnutrition: Secondary | ICD-10-CM | POA: Diagnosis not present

## 2016-04-22 DIAGNOSIS — Z789 Other specified health status: Secondary | ICD-10-CM | POA: Diagnosis present

## 2016-04-22 DIAGNOSIS — D509 Iron deficiency anemia, unspecified: Secondary | ICD-10-CM | POA: Diagnosis present

## 2016-04-22 DIAGNOSIS — G43909 Migraine, unspecified, not intractable, without status migrainosus: Secondary | ICD-10-CM | POA: Diagnosis present

## 2016-04-22 DIAGNOSIS — K9189 Other postprocedural complications and disorders of digestive system: Secondary | ICD-10-CM | POA: Diagnosis present

## 2016-04-22 DIAGNOSIS — K909 Intestinal malabsorption, unspecified: Secondary | ICD-10-CM | POA: Diagnosis present

## 2016-04-22 DIAGNOSIS — F1020 Alcohol dependence, uncomplicated: Secondary | ICD-10-CM | POA: Diagnosis present

## 2016-04-22 DIAGNOSIS — Z6820 Body mass index (BMI) 20.0-20.9, adult: Secondary | ICD-10-CM

## 2016-04-22 DIAGNOSIS — G43A0 Cyclical vomiting, not intractable: Secondary | ICD-10-CM | POA: Diagnosis not present

## 2016-04-22 DIAGNOSIS — F1090 Alcohol use, unspecified, uncomplicated: Secondary | ICD-10-CM

## 2016-04-22 DIAGNOSIS — Z79891 Long term (current) use of opiate analgesic: Secondary | ICD-10-CM

## 2016-04-22 DIAGNOSIS — M797 Fibromyalgia: Secondary | ICD-10-CM | POA: Diagnosis present

## 2016-04-22 DIAGNOSIS — Z9884 Bariatric surgery status: Secondary | ICD-10-CM

## 2016-04-22 DIAGNOSIS — E538 Deficiency of other specified B group vitamins: Secondary | ICD-10-CM | POA: Diagnosis present

## 2016-04-22 DIAGNOSIS — D508 Other iron deficiency anemias: Secondary | ICD-10-CM | POA: Diagnosis not present

## 2016-04-22 DIAGNOSIS — G8929 Other chronic pain: Secondary | ICD-10-CM

## 2016-04-22 DIAGNOSIS — R112 Nausea with vomiting, unspecified: Secondary | ICD-10-CM | POA: Diagnosis not present

## 2016-04-22 DIAGNOSIS — R569 Unspecified convulsions: Secondary | ICD-10-CM | POA: Diagnosis present

## 2016-04-22 DIAGNOSIS — Z79899 Other long term (current) drug therapy: Secondary | ICD-10-CM

## 2016-04-22 DIAGNOSIS — K3184 Gastroparesis: Secondary | ICD-10-CM | POA: Diagnosis present

## 2016-04-22 HISTORY — DX: Lactose intolerance, unspecified: E73.9

## 2016-04-22 HISTORY — DX: Other chronic pain: G89.29

## 2016-04-22 HISTORY — DX: Alcohol abuse, uncomplicated: F10.10

## 2016-04-22 HISTORY — DX: Type 2 diabetes mellitus without complications: E11.9

## 2016-04-22 HISTORY — DX: Bipolar disorder, unspecified: F31.9

## 2016-04-22 HISTORY — DX: Low back pain: M54.5

## 2016-04-22 HISTORY — DX: Low back pain, unspecified: M54.50

## 2016-04-22 HISTORY — DX: Depression, unspecified: F32.A

## 2016-04-22 HISTORY — DX: Major depressive disorder, single episode, unspecified: F32.9

## 2016-04-22 LAB — COMPREHENSIVE METABOLIC PANEL
ALT: 12 U/L — ABNORMAL LOW (ref 14–54)
ALT: 11 U/L — ABNORMAL LOW (ref 14–54)
AST: 18 U/L (ref 15–41)
AST: 13 U/L — ABNORMAL LOW (ref 15–41)
Albumin: 3.9 g/dL (ref 3.5–5.0)
Albumin: 3.3 g/dL — ABNORMAL LOW (ref 3.5–5.0)
Alkaline Phosphatase: 66 U/L (ref 38–126)
Alkaline Phosphatase: 58 U/L (ref 38–126)
Anion gap: 12 (ref 5–15)
Anion gap: 8 (ref 5–15)
BUN: <5 mg/dL — ABNORMAL LOW (ref 6–20)
BUN: <5 mg/dL — ABNORMAL LOW (ref 6–20)
CO2: 23 mmol/L (ref 22–32)
CO2: 27 mmol/L (ref 22–32)
Calcium: 9.1 mg/dL (ref 8.9–10.3)
Calcium: 8.6 mg/dL — ABNORMAL LOW (ref 8.9–10.3)
Chloride: 99 mmol/L — ABNORMAL LOW (ref 101–111)
Chloride: 100 mmol/L — ABNORMAL LOW (ref 101–111)
Creatinine, Ser: 0.63 mg/dL (ref 0.44–1.00)
Creatinine, Ser: 0.53 mg/dL (ref 0.44–1.00)
GFR calc Af Amer: >60 mL/min (ref >60)
GFR calc Af Amer: >60 mL/min (ref >60)
GFR calc non Af Amer: >60 mL/min (ref >60)
GFR calc non Af Amer: >60 mL/min (ref >60)
Glucose, Bld: 70 mg/dL (ref 65–99)
Glucose, Bld: 68 mg/dL (ref 65–99)
Potassium: 3.1 mmol/L — ABNORMAL LOW (ref 3.5–5.1)
Potassium: 3.3 mmol/L — ABNORMAL LOW (ref 3.5–5.1)
Sodium: 134 mmol/L — ABNORMAL LOW (ref 135–145)
Sodium: 135 mmol/L (ref 135–145)
Total Bilirubin: 0.4 mg/dL (ref 0.3–1.2)
Total Bilirubin: 0.5 mg/dL (ref 0.3–1.2)
Total Protein: 6.8 g/dL (ref 6.5–8.1)
Total Protein: 6 g/dL — ABNORMAL LOW (ref 6.5–8.1)

## 2016-04-22 LAB — CBC WITH DIFFERENTIAL/PLATELET
Basophils Absolute: 0 10*3/uL (ref 0.0–0.1)
Basophils Absolute: 0 10*3/uL (ref 0.0–0.1)
Basophils Relative: 0 %
Basophils Relative: 0 %
Eosinophils Absolute: 0 10*3/uL (ref 0.0–0.7)
Eosinophils Absolute: 0.1 10*3/uL (ref 0.0–0.7)
Eosinophils Relative: 1 %
Eosinophils Relative: 2 %
HCT: 28.1 % — ABNORMAL LOW (ref 36.0–46.0)
HCT: 32 % — ABNORMAL LOW (ref 36.0–46.0)
Hemoglobin: 10.8 g/dL — ABNORMAL LOW (ref 12.0–15.0)
Hemoglobin: 9.4 g/dL — ABNORMAL LOW (ref 12.0–15.0)
Lymphocytes Relative: 20 %
Lymphocytes Relative: 35 %
Lymphs Abs: 1.1 10*3/uL (ref 0.7–4.0)
Lymphs Abs: 1.4 10*3/uL (ref 0.7–4.0)
MCH: 31 pg (ref 26.0–34.0)
MCH: 32 pg (ref 26.0–34.0)
MCHC: 33.5 g/dL (ref 30.0–36.0)
MCHC: 33.8 g/dL (ref 30.0–36.0)
MCV: 92.7 fL (ref 78.0–100.0)
MCV: 95 fL (ref 78.0–100.0)
Monocytes Absolute: 0.3 10*3/uL (ref 0.1–1.0)
Monocytes Absolute: 0.5 10*3/uL (ref 0.1–1.0)
Monocytes Relative: 7 %
Monocytes Relative: 9 %
Neutro Abs: 2.3 10*3/uL (ref 1.7–7.7)
Neutro Abs: 3.9 10*3/uL (ref 1.7–7.7)
Neutrophils Relative %: 56 %
Neutrophils Relative %: 70 %
Platelets: 332 10*3/uL (ref 150–400)
Platelets: 332 10*3/uL (ref 150–400)
RBC: 3.03 MIL/uL — ABNORMAL LOW (ref 3.87–5.11)
RBC: 3.37 MIL/uL — ABNORMAL LOW (ref 3.87–5.11)
RDW: 15.3 % (ref 11.5–15.5)
RDW: 15.4 % (ref 11.5–15.5)
WBC: 4 10*3/uL (ref 4.0–10.5)
WBC: 5.6 10*3/uL (ref 4.0–10.5)

## 2016-04-22 LAB — RAPID URINE DRUG SCREEN, HOSP PERFORMED
Amphetamines: NOT DETECTED
Barbiturates: NOT DETECTED
Benzodiazepines: NOT DETECTED
Cocaine: NOT DETECTED
Opiates: NOT DETECTED
Tetrahydrocannabinol: NOT DETECTED

## 2016-04-22 LAB — GLUCOSE, CAPILLARY
Glucose-Capillary: 53 mg/dL — ABNORMAL LOW (ref 65–99)
Glucose-Capillary: 84 mg/dL (ref 65–99)
Glucose-Capillary: 89 mg/dL (ref 65–99)
Glucose-Capillary: 51 mg/dL — ABNORMAL LOW (ref 65–99)
Glucose-Capillary: 72 mg/dL (ref 65–99)
Glucose-Capillary: 122 mg/dL — ABNORMAL HIGH (ref 65–99)
Glucose-Capillary: 56 mg/dL — ABNORMAL LOW (ref 65–99)
Glucose-Capillary: 68 mg/dL (ref 65–99)
Glucose-Capillary: 46 mg/dL — ABNORMAL LOW (ref 65–99)
Glucose-Capillary: 136 mg/dL — ABNORMAL HIGH (ref 65–99)
Glucose-Capillary: 58 mg/dL — ABNORMAL LOW (ref 65–99)
Glucose-Capillary: 85 mg/dL (ref 65–99)

## 2016-04-22 LAB — URINALYSIS, ROUTINE W REFLEX MICROSCOPIC
Bilirubin Urine: NEGATIVE
Glucose, UA: 50 mg/dL — AB
Hgb urine dipstick: NEGATIVE
Ketones, ur: NEGATIVE mg/dL
Leukocytes, UA: NEGATIVE
Nitrite: NEGATIVE
Protein, ur: NEGATIVE mg/dL
Specific Gravity, Urine: 1.004 — ABNORMAL LOW (ref 1.005–1.030)
pH: 7 (ref 5.0–8.0)

## 2016-04-22 LAB — RETICULOCYTES
RBC.: 3.26 MIL/uL — ABNORMAL LOW (ref 3.87–5.11)
Retic Count, Absolute: 26.1 10*3/uL (ref 19.0–186.0)
Retic Ct Pct: 0.8 % (ref 0.4–3.1)

## 2016-04-22 LAB — CBG MONITORING, ED
Glucose-Capillary: 43 mg/dL — CL (ref 65–99)
Glucose-Capillary: 43 mg/dL — CL (ref 65–99)
Glucose-Capillary: 56 mg/dL — ABNORMAL LOW (ref 65–99)
Glucose-Capillary: 71 mg/dL (ref 65–99)
Glucose-Capillary: 74 mg/dL (ref 65–99)

## 2016-04-22 LAB — IRON AND TIBC
Iron: 50 ug/dL (ref 28–170)
Saturation Ratios: 18 % (ref 10.4–31.8)
TIBC: 286 ug/dL (ref 250–450)
UIBC: 236 ug/dL

## 2016-04-22 LAB — TYPE AND SCREEN
ABO/RH(D): B POS
Antibody Screen: NEGATIVE

## 2016-04-22 LAB — I-STAT CHEM 8, ED
BUN: <3 mg/dL — ABNORMAL LOW (ref 6–20)
Calcium, Ion: 1.15 mmol/L (ref 1.15–1.40)
Chloride: 98 mmol/L — ABNORMAL LOW (ref 101–111)
Creatinine, Ser: 0.5 mg/dL (ref 0.44–1.00)
Glucose, Bld: 73 mg/dL (ref 65–99)
HCT: 34 % — ABNORMAL LOW (ref 36.0–46.0)
Hemoglobin: 11.6 g/dL — ABNORMAL LOW (ref 12.0–15.0)
Potassium: 3.2 mmol/L — ABNORMAL LOW (ref 3.5–5.1)
Sodium: 135 mmol/L (ref 135–145)
TCO2: 24 mmol/L (ref 0–100)

## 2016-04-22 LAB — PREGNANCY, URINE: Preg Test, Ur: NEGATIVE

## 2016-04-22 LAB — ETHANOL: Alcohol, Ethyl (B): 6 mg/dL — ABNORMAL HIGH (ref <5)

## 2016-04-22 LAB — TROPONIN I: Troponin I: <0.03 ng/mL (ref <0.03)

## 2016-04-22 LAB — VITAMIN B12: Vitamin B-12: 325 pg/mL (ref 180–914)

## 2016-04-22 LAB — FOLATE: Folate: 33.2 ng/mL (ref >5.9)

## 2016-04-22 LAB — FERRITIN: Ferritin: 132 ng/mL (ref 11–307)

## 2016-04-22 MED ORDER — DIPHENHYDRAMINE HCL 50 MG/ML IJ SOLN
25.0000 mg | Freq: Once | INTRAMUSCULAR | Status: AC
  Administered 2016-04-22: 25 mg via INTRAVENOUS
  Filled 2016-04-22: qty 1

## 2016-04-22 MED ORDER — DEXTROSE-NACL 5-0.9 % IV SOLN
INTRAVENOUS | Status: DC
  Administered 2016-04-22 (×2): via INTRAVENOUS

## 2016-04-22 MED ORDER — ACETAMINOPHEN 325 MG PO TABS: 650.0000 mg | ORAL_TABLET | Freq: Four times a day (QID) | ORAL | Status: DC | PRN

## 2016-04-22 MED ORDER — ADULT MULTIVITAMIN W/MINERALS CH
1.0000 | ORAL_TABLET | Freq: Every day | ORAL | Status: DC
  Administered 2016-04-22 – 2016-04-27 (×6): 1 via ORAL
  Filled 2016-04-22 (×6): qty 1

## 2016-04-22 MED ORDER — LORAZEPAM 2 MG/ML IJ SOLN: 1.0000 mg | Freq: Four times a day (QID) | INTRAMUSCULAR | Status: AC | PRN

## 2016-04-22 MED ORDER — ONDANSETRON HCL 4 MG PO TABS: 4.0000 mg | ORAL_TABLET | Freq: Four times a day (QID) | ORAL | Status: DC | PRN

## 2016-04-22 MED ORDER — SODIUM CHLORIDE 0.9 % IV SOLN
250.0000 mg | INTRAVENOUS | Status: AC
  Administered 2016-04-22: 250 mg via INTRAVENOUS
  Filled 2016-04-22 (×3): qty 20

## 2016-04-22 MED ORDER — VITAMIN B-1 100 MG PO TABS
100.0000 mg | ORAL_TABLET | Freq: Every day | ORAL | Status: DC
  Administered 2016-04-22 – 2016-04-27 (×6): 100 mg via ORAL
  Filled 2016-04-22 (×6): qty 1

## 2016-04-22 MED ORDER — THIAMINE HCL 100 MG/ML IJ SOLN
100.0000 mg | Freq: Once | INTRAMUSCULAR | Status: AC
  Administered 2016-04-22: 100 mg via INTRAVENOUS
  Filled 2016-04-22: qty 2

## 2016-04-22 MED ORDER — LORAZEPAM 1 MG PO TABS
0.0000 mg | ORAL_TABLET | Freq: Four times a day (QID) | ORAL | Status: AC
  Administered 2016-04-24: 1 mg via ORAL
  Filled 2016-04-22: qty 1

## 2016-04-22 MED ORDER — OXYCODONE-ACETAMINOPHEN 10-325 MG PO TABS
1.0000 | ORAL_TABLET | ORAL | Status: DC | PRN
Start: 2016-04-22 — End: 2016-04-22

## 2016-04-22 MED ORDER — SODIUM CHLORIDE 0.9 % IV SOLN: 25.0000 mg | Freq: Once | INTRAVENOUS | Status: DC

## 2016-04-22 MED ORDER — OXYCODONE HCL 5 MG PO TABS
5.0000 mg | ORAL_TABLET | Freq: Four times a day (QID) | ORAL | Status: DC | PRN
  Administered 2016-04-22: 5 mg via ORAL
  Filled 2016-04-22: qty 1

## 2016-04-22 MED ORDER — POTASSIUM CHLORIDE CRYS ER 20 MEQ PO TBCR
40.0000 meq | EXTENDED_RELEASE_TABLET | Freq: Once | ORAL | Status: AC
Start: 2016-04-22 — End: 2016-04-22
  Administered 2016-04-22: 40 meq via ORAL
  Filled 2016-04-22: qty 2

## 2016-04-22 MED ORDER — EPINEPHRINE 0.3 MG/0.3ML IJ SOAJ
0.3000 mg | INTRAMUSCULAR | Status: DC
  Filled 2016-04-22: qty 0.3

## 2016-04-22 MED ORDER — PANTOPRAZOLE SODIUM 40 MG PO TBEC
40.0000 mg | DELAYED_RELEASE_TABLET | Freq: Every day | ORAL | Status: DC
  Administered 2016-04-22 – 2016-04-27 (×6): 40 mg via ORAL
  Filled 2016-04-22 (×6): qty 1

## 2016-04-22 MED ORDER — EPINEPHRINE PF 1 MG/10ML IJ SOSY
PREFILLED_SYRINGE | INTRAMUSCULAR | Status: AC
  Filled 2016-04-22: qty 10

## 2016-04-22 MED ORDER — SODIUM CHLORIDE 0.9 % IV SOLN: 500.0000 mg | Freq: Once | INTRAVENOUS | Status: DC

## 2016-04-22 MED ORDER — SODIUM CHLORIDE 0.9 % IV SOLN
25.0000 mg | Freq: Once | INTRAVENOUS | Status: AC
  Administered 2016-04-22: 25 mg via INTRAVENOUS
  Filled 2016-04-22: qty 2

## 2016-04-22 MED ORDER — DEXTROSE 50 % IV SOLN
INTRAVENOUS | Status: AC
  Administered 2016-04-22: 01:00:00
  Filled 2016-04-22: qty 50

## 2016-04-22 MED ORDER — NA FERRIC GLUC CPLX IN SUCROSE 12.5 MG/ML IV SOLN
125.0000 mg | Freq: Once | INTRAVENOUS | Status: DC
  Filled 2016-04-22: qty 10

## 2016-04-22 MED ORDER — FOLIC ACID 1 MG PO TABS
1.0000 mg | ORAL_TABLET | Freq: Every day | ORAL | Status: DC
  Administered 2016-04-22 – 2016-04-27 (×6): 1 mg via ORAL
  Filled 2016-04-22 (×6): qty 1

## 2016-04-22 MED ORDER — LORAZEPAM 1 MG PO TABS
0.0000 mg | ORAL_TABLET | Freq: Two times a day (BID) | ORAL | Status: AC
  Administered 2016-04-24: 2 mg via ORAL
  Administered 2016-04-25: 1 mg via ORAL
  Administered 2016-04-25: 2 mg via ORAL
  Filled 2016-04-22: qty 1
  Filled 2016-04-22 (×3): qty 2

## 2016-04-22 MED ORDER — DEXTROSE 10 % IV SOLN
INTRAVENOUS | Status: DC
  Administered 2016-04-22 – 2016-04-26 (×6): via INTRAVENOUS

## 2016-04-22 MED ORDER — THIAMINE HCL 100 MG/ML IJ SOLN: 100.0000 mg | Freq: Every day | INTRAMUSCULAR | Status: DC

## 2016-04-22 MED ORDER — OXYMETAZOLINE HCL 0.05 % NA SOLN
1.0000 | Freq: Two times a day (BID) | NASAL | Status: DC
  Administered 2016-04-22 – 2016-04-27 (×11): 1 via NASAL
  Filled 2016-04-22: qty 15

## 2016-04-22 MED ORDER — DEXTROSE 50 % IV SOLN
1.0000 | Freq: Once | INTRAVENOUS | Status: AC
  Administered 2016-04-22: 50 mL via INTRAVENOUS
  Filled 2016-04-22: qty 50

## 2016-04-22 MED ORDER — LORAZEPAM 1 MG PO TABS
1.0000 mg | ORAL_TABLET | Freq: Four times a day (QID) | ORAL | Status: AC | PRN
  Administered 2016-04-24 – 2016-04-25 (×2): 1 mg via ORAL
  Filled 2016-04-22 (×2): qty 1

## 2016-04-22 MED ORDER — BOOST / RESOURCE BREEZE PO LIQD
1.0000 | Freq: Three times a day (TID) | ORAL | Status: DC
  Administered 2016-04-22 – 2016-04-27 (×12): 1 via ORAL

## 2016-04-22 MED ORDER — ACETAMINOPHEN 650 MG RE SUPP: 650.0000 mg | Freq: Four times a day (QID) | RECTAL | Status: DC | PRN

## 2016-04-22 MED ORDER — OXYCODONE-ACETAMINOPHEN 5-325 MG PO TABS
1.0000 | ORAL_TABLET | ORAL | Status: DC | PRN
  Administered 2016-04-22 (×2): 1 via ORAL
  Filled 2016-04-22 (×2): qty 1

## 2016-04-22 MED ORDER — OXYCODONE HCL 5 MG PO TABS
5.0000 mg | ORAL_TABLET | ORAL | Status: DC | PRN
  Administered 2016-04-22: 5 mg via ORAL
  Filled 2016-04-22: qty 1

## 2016-04-22 MED ORDER — OXYCODONE-ACETAMINOPHEN 5-325 MG PO TABS
1.0000 | ORAL_TABLET | Freq: Four times a day (QID) | ORAL | Status: DC | PRN
  Administered 2016-04-22: 1 via ORAL
  Filled 2016-04-22: qty 1

## 2016-04-22 MED ORDER — OXYCODONE-ACETAMINOPHEN 5-325 MG PO TABS: 1.0000 | ORAL_TABLET | Freq: Four times a day (QID) | ORAL | Status: DC | PRN

## 2016-04-22 MED ORDER — ONDANSETRON HCL 4 MG/2ML IJ SOLN
4.0000 mg | Freq: Four times a day (QID) | INTRAMUSCULAR | Status: DC | PRN
  Administered 2016-04-22 (×2): 4 mg via INTRAVENOUS
  Filled 2016-04-22 (×2): qty 2

## 2016-04-22 MED ORDER — OXYCODONE HCL 5 MG PO TABS
5.0000 mg | ORAL_TABLET | ORAL | Status: DC | PRN
  Administered 2016-04-22 (×2): 5 mg via ORAL
  Filled 2016-04-22 (×2): qty 1

## 2016-04-22 NOTE — ED Triage Notes (Addendum)
Presents post seizure without a seizure history-per EMS pt is from home, boyfriend is with her and witnessed seizure-tonic clonic movements described by boyfriend-estimated time was 2 minutes. EMS states she was post-ictal at pickup-no more alert and oriented. Denies ETOH and drugs. CBG 43

## 2016-04-22 NOTE — Progress Notes (Signed)
MEDICATION RELATED CONSULT NOTE - INITIAL   Pharmacy Consult for IV iron Indication: anemia  No Known Allergies  Patient Measurements: Height: 5' (152.4 cm) Weight: 107 lb 1.6 oz (48.6 kg) IBW/kg (Calculated) : 45.5  Vital Signs: Temp: 97.9 F (36.6 C) (12/28 0607) Temp Source: Oral (12/28 0607) BP: 144/92 (12/28 0607) Pulse Rate: 75 (12/28 0607) Intake/Output from previous day: 12/27 0701 - 12/28 0700 In: 406.3 [P.O.:120; I.V.:286.3] Out: -  Intake/Output from this shift: Total I/O In: 360 [P.O.:360] Out: 2100 [Urine:2100]  Labs:  Recent Labs  04/22/16 0034 04/22/16 0042 04/22/16 0635  WBC 4.0  --  5.6  HGB 10.8* 11.6* 9.4*  HCT 32.0* 34.0* 28.1*  PLT 332  --  332  CREATININE 0.63 0.50 0.53  ALBUMIN 3.9  --  3.3*  PROT 6.8  --  6.0*  AST 18  --  13*  ALT 12*  --  11*  ALKPHOS 66  --  58  BILITOT 0.4  --  0.5   Estimated Creatinine Clearance: 61.1 mL/min (by C-G formula based on SCr of 0.53 mg/dL).   Microbiology: No results found for this or any previous visit (from the past 720 hour(s)).  Medical History: Past Medical History:  Diagnosis Date  . Abnormal Pap smear   . Anemia   . Anxiety    Takes xanax  . Arthritis    Has rx for percocet  . Bulging lumbar disc   . Chronic pain   . Fibromyalgia   . Lactose intolerance in adult 2017  . Migraines     Assessment: 50 year old female admitted with anemia due to possible malabsorption related to GI surgery, alcohol abuse.  Pharmacy asked to dose IV iron, deficit calculated at about 700 to 800 mg  Goal of Therapy:  Appropriate dosing  Plan:  IV nulecit test dose already ordered at 25 mg (not necessarily needed) IV nulecit dose increased to 250 mg iv Q 24 x 2 doses for now Follow up CBC in AM and EGD results  Thank you [name], PharmD [phone]  Tad Moore 04/22/2016,3:26 PM

## 2016-04-22 NOTE — Progress Notes (Signed)
Hypoglycemic Event  CBG: 54  Treatment: 15 GM carbohydrate snack  Symptoms: None  Follow-up CBG: Time:0829 CBG Result:84  Possible Reasons for Event: Unknown  Comments/MD notified: pt drank orange juice and BS came up to [place]

## 2016-04-22 NOTE — Progress Notes (Deleted)
MEDICATION RELATED CONSULT NOTE - INITIAL   Pharmacy Consult for IV Iron replacement Indication: Anemia   No Known Allergies  Patient Measurements: Height: 5' (152.4 cm) Weight: 107 lb 1.6 oz (48.6 kg) IBW/kg (Calculated) : 45.5   Vital Signs: Temp: 97.9 F (36.6 C) (12/28 0607) Temp Source: Oral (12/28 0607) BP: 144/92 (12/28 0607) Pulse Rate: 75 (12/28 0607) Intake/Output from previous day: 12/27 0701 - 12/28 0700 In: 406.3 [P.O.:120; I.V.:286.3] Out: -  Intake/Output from this shift: Total I/O In: 360 [P.O.:360] Out: 2100 [Urine:2100]  Labs:  Recent Labs  04/22/16 0034 04/22/16 0042 04/22/16 0635  WBC 4.0  --  5.6  HGB 10.8* 11.6* 9.4*  HCT 32.0* 34.0* 28.1*  PLT 332  --  332  CREATININE 0.63 0.50 0.53  ALBUMIN 3.9  --  3.3*  PROT 6.8  --  6.0*  AST 18  --  13*  ALT 12*  --  11*  ALKPHOS 66  --  58  BILITOT 0.4  --  0.5   Estimated Creatinine Clearance: 61.1 mL/min (by C-G formula based on SCr of 0.53 mg/dL).   Microbiology: No results found for this or any previous visit (from the past 720 hour(s)).  Medical History: Past Medical History:  Diagnosis Date  . Abnormal Pap smear   . Anemia   . Anxiety    Takes xanax  . Arthritis    Has rx for percocet  . Bulging lumbar disc   . Chronic pain   . Fibromyalgia   . Lactose intolerance in adult 2017  . Migraines     Medications:  Scheduled:  . feeding supplement  1 Container Oral TID BM  . ferric gluconate (FERRLECIT/NULECIT) IV  125 mg Intravenous Once  . ferric gluconate (FERRLECIT/NULECIT) Test Dose  25 mg Intravenous Once  . folic acid  1 mg Oral Daily  . LORazepam  0-4 mg Oral Q6H   Followed by  . [START ON 04/24/2016] LORazepam  0-4 mg Oral Q12H  . multivitamin with minerals  1 tablet Oral Daily  . pantoprazole  40 mg Oral Daily  . 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  She required blood transfusions in Oct for anemia and at that time was told to take Fe TID, which she has not been taking.  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   GI has already written for Nulecit 125mg  IV x 1.    Goal of Therapy:  Replenishment of Iron stores  Plan:  Will complete dosing with Iron Dextran rather than 7 more days of Nulecit. Iron dextran 500mg  IV x 1 on 12/29, in addition to today's Nulecit for target Hg 12 Pt will need oral Fe supplementation   Gracy Bruins, PharmD [facility]

## 2016-04-22 NOTE — H&P (Signed)
History and Physical    Angie [name] [id number] DOB: 05-02-1965 DOA: 04/22/2016  PCP: Ricke Hey, MD  Patient coming from: Home.  Chief Complaint: Loss of consciousness.  HPI: Angie [name] is a 50 y.o. female with alcohol abuse, history of gastric bypass, anxiety and fibromyalgia and anemia who was admitted in October this year for hypotension and dizziness with severe anemia had received transfusion and at that time CT abdomen was unremarkable presents to the ER after patient had a syncopal episode as witnessed by patient's friend. In the ER patient was found to be hypoglycemic repeatedly and was started on D5 normal. Patient states she has been not eating well for last few weeks because of nausea. Patient also has been having epigastric discomfort which has been chronic. Patient also drinks alcohol every day. Patient has been losing weight for last few weeks. Patient will be admitted for syncope most likely secondary to hypoglycemia. As per the ER physician who had discussed with patient's friend patient had a brief episode of syncopal episode which lasted for less than 2 minutes with eyes rolling back and convulsions. Patient did not have any incontinence of urine or tongue bite. CT head was unremarkable and patient appears nonfocal.   ED Course: CT head was unremarkable. Patient was given D50 and following which patient was started on D5 normal saline for hypoglycemia.  Review of Systems: As per HPI, rest all negative.   Past Medical History:  Diagnosis Date  . Abnormal Pap smear   . Anemia   . Anxiety    Takes xanax  . Arthritis    Has rx for percocet  . Bulging lumbar disc   . Chronic pain   . Fibromyalgia   . Migraines     Past Surgical History:  Procedure Laterality Date  . CERVICAL CONE BIOPSY    . CESAREAN SECTION     X 3  . CESAREAN SECTION WITH BILATERAL TUBAL LIGATION Bilateral 10/28/2012   Procedure: Repeat cesarean section with delivery of baby boy  at 1210. Apgars 8/9.  BILATERAL TUBAL LIGATION;  Surgeon: Florian Buff, MD;  Location: [facility];  Service: Obstetrics;  Laterality: Bilateral;  . GASTRIC BYPASS  2007  . GASTRIC BYPASS       reports that she quit smoking about 4 years ago. Her smoking use included Cigarettes. She has a 5.00 pack-year smoking history. She has never used smokeless tobacco. She reports that she drinks alcohol. She reports that she does not use drugs.  No Known Allergies  Family History  Problem Relation Age of Onset  . Diabetes Father   . Depression Maternal Grandmother   . Heart disease Maternal Grandfather   . Depression Paternal Grandmother     Prior to Admission medications   Medication Sig Start Date End Date Taking? Authorizing Provider  ALPRAZolam Duanne Moron) 1 MG tablet Take 1 mg by mouth 3 (three) times daily.  04/28/13  Yes Historical Provider, MD  omeprazole (PRILOSEC) 20 MG capsule Take 20 mg by mouth daily.   Yes Historical Provider, MD  ondansetron (ZOFRAN ODT) 4 MG disintegrating tablet Take 1 tablet (4 mg total) by mouth every 8 (eight) hours as needed for nausea. 02/12/16  Yes Tanna Furry, MD  oxyCODONE-acetaminophen (PERCOCET) 10-325 MG tablet Take 1 tablet by mouth every 4 (four) hours as needed for pain.   Yes Historical Provider, MD  oxymetazoline (AFRIN) 0.05 % nasal spray Place 1 spray into both nostrils 2 (two) times daily as needed  for congestion. 02/05/16  Yes Patrecia Pour, MD  gabapentin (NEURONTIN) 300 MG capsule Take 1 capsule (300 mg total) by mouth 3 (three) times daily. Patient not taking: Reported on 04/22/2016 07/06/13   Charlett Blake, MD  Multiple Vitamin (MULTIVITAMIN WITH MINERALS) TABS tablet Take 1 tablet by mouth daily. Patient not taking: Reported on 04/22/2016 02/06/16   Patrecia Pour, MD  pantoprazole (PROTONIX) 40 MG tablet Take 1 tablet (40 mg total) by mouth 2 (two) times daily. Patient not taking: Reported on 04/22/2016 02/05/16   Patrecia Pour, MD    Physical  Exam: Vitals:   04/22/16 0430 04/22/16 0445 04/22/16 0500 04/22/16 0515  BP: (!) 139/102 145/100 141/93 135/95  Pulse: 73 69 76 73  Resp: 13 12 23 10   SpO2: 100% 100% 100% 100%      Constitutional: Poorly built and nourished. Vitals:   04/22/16 0430 04/22/16 0445 04/22/16 0500 04/22/16 0515  BP: (!) 139/102 145/100 141/93 135/95  Pulse: 73 69 76 73  Resp: 13 12 23 10   SpO2: 100% 100% 100% 100%   Eyes: Anicteric mild pallor. ENMT: No discharge from the ears eyes nose and mouth. Neck: No mass felt. No neck rigidity. Respiratory: No rhonchi or crepitations. Cardiovascular: S1 and S2 heard no murmurs appreciated. Abdomen: Soft nontender bowel sounds present. No guarding or rigidity. Musculoskeletal: No edema. No joint effusion. Skin: No rash. Skin appears warm. Neurologic: Alert awake oriented to time place and person. Moves all extremities. Psychiatric: Appears normal. Normal affect.   Labs on Admission: I have personally reviewed following labs and imaging studies  CBC:  Recent Labs Lab 04/22/16 0034 04/22/16 0042  WBC 4.0  --   NEUTROABS 2.3  --   HGB 10.8* 11.6*  HCT 32.0* 34.0*  MCV 95.0  --   PLT 332  --    Basic Metabolic Panel:  Recent Labs Lab 04/22/16 0034 04/22/16 0042  NA 134* 135  K 3.1* 3.2*  CL 99* 98*  CO2 23  --   GLUCOSE 70 73  BUN <5* <3*  CREATININE 0.63 0.50  CALCIUM 9.1  --    GFR: CrCl cannot be calculated (Unknown ideal weight.). Liver Function Tests:  Recent Labs Lab 04/22/16 0034  AST 18  ALT 12*  ALKPHOS 66  BILITOT 0.4  PROT 6.8  ALBUMIN 3.9   No results for input(s): LIPASE, AMYLASE in the last 168 hours. No results for input(s): AMMONIA in the last 168 hours. Coagulation Profile: No results for input(s): INR, PROTIME in the last 168 hours. Cardiac Enzymes: No results for input(s): CKTOTAL, CKMB, CKMBINDEX, TROPONINI in the last 168 hours. BNP (last 3 results) No results for input(s): PROBNP in the last 8760  hours. HbA1C: No results for input(s): HGBA1C in the last 72 hours. CBG:  Recent Labs Lab 04/22/16 0012 04/22/16 0015 04/22/16 0058 04/22/16 0209 04/22/16 0404  GLUCAP 43* 43* 56* 71 74   Lipid Profile: No results for input(s): CHOL, HDL, LDLCALC, TRIG, CHOLHDL, LDLDIRECT in the last 72 hours. Thyroid Function Tests: No results for input(s): TSH, T4TOTAL, FREET4, T3FREE, THYROIDAB in the last 72 hours. Anemia Panel: No results for input(s): VITAMINB12, FOLATE, FERRITIN, TIBC, IRON, RETICCTPCT in the last 72 hours. Urine analysis:    Component Value Date/Time   COLORURINE COLORLESS (A) 04/22/2016 0038   APPEARANCEUR CLEAR 04/22/2016 0038   LABSPEC 1.004 (L) 04/22/2016 0038   PHURINE 7.0 04/22/2016 0038   GLUCOSEU 50 (A) 04/22/2016 0038   HGBUR NEGATIVE  04/22/2016 0038   BILIRUBINUR NEGATIVE 04/22/2016 0038   KETONESUR NEGATIVE 04/22/2016 0038   PROTEINUR NEGATIVE 04/22/2016 0038   UROBILINOGEN 0.2 10/26/2012 1115   NITRITE NEGATIVE 04/22/2016 0038   LEUKOCYTESUR NEGATIVE 04/22/2016 0038   Sepsis Labs: @LABRCNTIP (procalcitonin:4,lacticidven:4) )No results found for this or any previous visit (from the past 240 hour(s)).   Radiological Exams on Admission: Ct Head Wo Contrast  Result Date: 04/22/2016 CLINICAL DATA:  Transient altered mental status.  Alcohol use. EXAM: CT HEAD WITHOUT CONTRAST TECHNIQUE: Contiguous axial images were obtained from the base of the skull through the vertex without intravenous contrast. COMPARISON:  None. FINDINGS: Brain: No evidence of acute infarction, hemorrhage, hydrocephalus, extra-axial collection or mass lesion/mass effect. Vascular: No hyperdense vessel or unexpected calcification. Skull: Normal. Negative for fracture or focal lesion. Sinuses/Orbits: Paranasal sinuses and mastoid air cells are clear. The visualized orbits are unremarkable. Other: None. IMPRESSION: No acute intracranial abnormality. Electronically Signed   By: Jeb Levering M.D.   On: 04/22/2016 02:49    EKG: Independently reviewed. Normal sinus rhythm with QTC of 462 ms and QRS of 88 ms.  Assessment/Plan Principal Problem:   Syncope Active Problems:   S/P gastric bypass   Hypoglycemia    1. Syncope most likely secondary to hypoglycemia - patient has been placed on D5 normal. Closely follow CBGs. Hypoglycemia probably from poor oral intake and alcoholism. However will closely monitor in telemetry and check 2-D echo to rule out other causes. 2. Nausea with poor oral intake and history of gastric bypass - may consider a GI consult. Acute abdominal series is pending. 3. History of alcohol abuse - on CIWA protocol. 4. Chronic anemia with iron deficiency - follow CBC.   DVT prophylaxis: SCDs for now. Code Status: Full code.  Family Communication: Discussed with patient.  Disposition Plan: Home.  Consults called: None.  Admission status: Observation.    Rise Patience MD [facility] Pager [phone].  If 7PM-7AM, please contact night-coverage [email or web address] Password [facility]  04/22/2016, 5:45 AM

## 2016-04-22 NOTE — Care Management Obs Status (Signed)
[facility] NOTIFICATION   Patient Details  Name: Angie [name] MRN: [id number] Date of Birth: March 02, 1966   Medicare Observation Status Notification Given:  Yes    Carles Collet, RN 04/22/2016, 3:24 PM

## 2016-04-22 NOTE — Progress Notes (Signed)
Initial Nutrition Assessment  DOCUMENTATION CODES:   Severe malnutrition in context of chronic illness  INTERVENTION:   -Boost Breeze po TID, each supplement provides 250 kcal and 9 grams of protein -Snacks TID between meals (ordered)  NUTRITION DIAGNOSIS:   Malnutrition related to chronic illness as evidenced by percent weight loss, severe depletion of muscle mass, severe depletion of body fat.  GOAL:   Patient will meet greater than or equal to 90% of their needs  MONITOR:   PO intake, Supplement acceptance, Diet advancement, Labs, Weight trends, Skin, I & O's  REASON FOR ASSESSMENT:   Malnutrition Screening Tool, Consult Assessment of nutrition requirement/status  ASSESSMENT:   Angie [name] is a 50 y.o. female with alcohol abuse, history of gastric bypass, anxiety and fibromyalgia and anemia who was admitted in October this year for hypotension and dizziness with severe anemia had received transfusion and at that time CT abdomen was unremarkable presents to the ER after patient had a syncopal episode as witnessed by patient's friend. In the ER patient was found to be hypoglycemic repeatedly and was started on D5 normal. Patient states she has been not eating well for last few weeks because of nausea.   Pt admitted with syncope related to hypoglycemia.   Pt in with GI at time of visit. Unable to obtain further nutrition hx or perform nutrition-focused physical exam at this time.   Pt is familiar to this writer due to previous admission approximately 2 months ago. Pt has hx of gastric bypass in 2007; she is lactose intolerant and does not tolerate milky-type supplements well, however, did enjoy Boost Breeze. Pt also has difficulty chewing solid foods due to lack of teeth- pt was choosing foods such as soups, mashed potatoes, and soft sandwiches and typically takes a long time to complete meals.   Pt with hx of severe malnutrition in the context of chronic illness, which is  ongoing. Nutrition-focused physical exam completed in 01/2016 revealed severe fat and muscle depletion. RD does not expect changes to exam at this time. Of note, pt has experienced a 22.4% wt loss over the past year, which is significant for time frame.   Per GI notes, alcohol use is likely playing a role with GI issues. Pt generally consumes 1/2 pint of vodka and a few hard lemonades daily. Plan for upper endo tomorrow.    Medications reviewed and include folvite, MVI, vitamin B-1, and KCl.   Labs reviewed: CBGS: 51-122.   Diet Order:  Diet full liquid Room service appropriate? Yes; Fluid consistency: Thin Diet NPO time specified Except for: Sips with Meds  Skin:  Reviewed, no issues  Last BM:  04/21/16  Height:   Ht Readings from Last 1 Encounters:  04/22/16 5' (1.524 m)    Weight:   Wt Readings from Last 1 Encounters:  04/22/16 107 lb 1.6 oz (48.6 kg)    Ideal Body Weight:  45.5 kg  BMI:  Body mass index is 20.92 kg/m.  Estimated Nutritional Needs:   Kcal:  1500-1700  Protein:  80-95 grams  Fluid:  1.5-1.7 L  EDUCATION NEEDS:   No education needs identified at this time  Aydan Phoenix A. Jimmye Norman, RD, LDN, CDE Pager: [phone] After hours Pager: [phone]

## 2016-04-22 NOTE — Care Management CC44 (Signed)
Condition Code 44 Documentation Completed  Patient Details  Name: Angie [name] MRN: [id number] Date of Birth: [birth year]   Condition Code 44 given:  Yes Patient signature on Condition Code 44 notice:  Yes Documentation of 2 MD's agreement:  Yes Code 44 added to claim:  Yes    Carles Collet, RN 04/22/2016, 3:24 PM

## 2016-04-22 NOTE — Progress Notes (Signed)
EEG completed, results pending. 

## 2016-04-22 NOTE — ED Notes (Signed)
Patient transported to CT 

## 2016-04-22 NOTE — ED Provider Notes (Signed)
[facility] DEPT Provider Note   CSN: [id number] Arrival date & time: 04/22/16  0009  History   Chief Complaint Chief Complaint  Patient presents with  . Seizures    HPI Angie [name] is a 50 y.o. female.  HPI   Pt has PMH of arthritis, s/p gastric bypass, anemia, hypotension, hyponatremia, AKI  - pt admitted to [facility] on 02/01/16 for hypotension N/V and was having chronic vomiting that they evulated her for, she was supposed to follow-up with Gastroenterology but admits she never called to make the appointment. Her N/V started 4 months ago per patient, she also started drinking alcohol daily 3 months ago, throughout the day she drinks [employer] Hard Lemonade or Hard Tea. She admits drinking her normal amount today. She has lately stopped attempting to eat since everything is vomited back up. She ate a few bites of noodles today but otherwise has not had anything to eat. On arrival her CBG is 62. Pt is not a diabetic. Denies drug use, fevers, headache, CP, back pain, le swelling.  Past Medical History:  Diagnosis Date  . Abnormal Pap smear   . Anemia   . Anxiety    Takes xanax  . Arthritis    Has rx for percocet  . Bulging lumbar disc   . Chronic pain   . Fibromyalgia   . Migraines     Patient Active Problem List   Diagnosis Date Noted  . Hypotension 02/01/2016  . Hyponatremia 02/01/2016  . Acute kidney injury ([place]) 02/01/2016  . Subacromial impingement of left shoulder 07/06/2013  . Trochanteric bursitis of left hip 05/11/2013  . Anemia 07/24/2012  . Back pain complicating pregnancy [id number]  . AMA (advanced maternal age) multigravida 35+ 05/16/2012  . Insufficient prenatal care 05/16/2012  . Previous cesarean section 05/16/2012  . Arthritis 05/16/2012  . S/P gastric bypass 05/16/2012    Past Surgical History:  Procedure Laterality Date  . CERVICAL CONE BIOPSY    . CESAREAN SECTION     X 3  . CESAREAN SECTION WITH BILATERAL TUBAL LIGATION Bilateral 10/28/2012     Procedure: Repeat cesarean section with delivery of baby boy at 1210. Apgars 8/9.  BILATERAL TUBAL LIGATION;  Surgeon: Florian Buff, MD;  Location: [facility];  Service: Obstetrics;  Laterality: Bilateral;  . GASTRIC BYPASS  2007  . GASTRIC BYPASS      OB History    Gravida Para Term Preterm AB Living   6 4 4  0 2 4   SAB TAB Ectopic Multiple Live Births   2       4     Home Medications    Prior to Admission medications   Medication Sig Start Date End Date Taking? Authorizing Provider  ALPRAZolam Duanne Moron) 1 MG tablet Take 1 mg by mouth 3 (three) times daily.  04/28/13   Historical Provider, MD  Cyanocobalamin (VITAMIN B12 PO) Take 1 tablet by mouth daily.    Historical Provider, MD  ferrous sulfate 325 (65 FE) MG tablet Take 325 mg by mouth 3 (three) times daily.     Historical Provider, MD  gabapentin (NEURONTIN) 300 MG capsule Take 1 capsule (300 mg total) by mouth 3 (three) times daily. Patient not taking: Reported on 02/01/2016 07/06/13   Charlett Blake, MD  lidocaine (XYLOCAINE) 5 % ointment Apply 1 application topically 2 (two) times daily as needed for moderate pain.    Historical Provider, MD  Multiple Vitamin (MULTIVITAMIN WITH MINERALS) TABS tablet Take 1 tablet by  mouth daily. 02/06/16   Patrecia Pour, MD  ondansetron (ZOFRAN ODT) 4 MG disintegrating tablet Take 1 tablet (4 mg total) by mouth every 8 (eight) hours as needed for nausea. 02/12/16   Tanna Furry, MD  OVER THE COUNTER MEDICATION Take 1 tablet by mouth daily. Biotin    Historical Provider, MD  oxyCODONE-acetaminophen (PERCOCET) 10-325 MG tablet Take 1 tablet by mouth every 4 (four) hours as needed for pain.    Historical Provider, MD  oxymetazoline (AFRIN) 0.05 % nasal spray Place 1 spray into both nostrils 2 (two) times daily as needed for congestion. 02/05/16   Patrecia Pour, MD  pantoprazole (PROTONIX) 40 MG tablet Take 1 tablet (40 mg total) by mouth 2 (two) times daily. 02/05/16   Patrecia Pour, MD    Family  History Family History  Problem Relation Age of Onset  . Diabetes Father   . Depression Maternal Grandmother   . Heart disease Maternal Grandfather   . Depression Paternal Grandmother     Social History Social History  Substance Use Topics  . Smoking status: Former Smoker    Packs/day: 0.50    Years: 10.00    Types: Cigarettes    Quit date: 03/16/2012  . Smokeless tobacco: Never Used  . Alcohol use 0.0 oz/week     Comment: socially     Allergies   Patient has no known allergies.   Review of Systems Review of Systems  Review of Systems All other systems negative except as documented in the HPI. All pertinent positives and negatives as reviewed in the HPI.  Physical Exam Updated Vital Signs There were no vitals taken for this visit.  Physical Exam  Constitutional: She is oriented to person, place, and time. She appears well-developed. She appears cachectic. She appears ill. No distress.  HENT:  Head: Normocephalic and atraumatic.  Nose: Nose normal.  Mouth/Throat: Uvula is midline, oropharynx is clear and moist and mucous membranes are normal.  Eyes: Pupils are equal, round, and reactive to light.  Neck: Normal range of motion. Neck supple.  Cardiovascular: Normal rate and regular rhythm.   Pulmonary/Chest: Effort normal.  Abdominal: Soft.  No signs of abdominal distention  Musculoskeletal:  No LE swelling  Neurological: She is alert and oriented to person, place, and time. She has normal strength. No cranial nerve deficit or sensory deficit.  Acting at baseline  Skin: Skin is warm and dry. No rash noted.  Nursing note and vitals reviewed.   ED Treatments / Results  Labs (all labs ordered are listed, but only abnormal results are displayed) Labs Reviewed  ETHANOL - Abnormal; Notable for the following:       Result Value   Alcohol, Ethyl (B) 6 (*)    All other components within normal limits  COMPREHENSIVE METABOLIC PANEL - Abnormal; Notable for the  following:    Sodium 134 (*)    Potassium 3.1 (*)    Chloride 99 (*)    BUN <5 (*)    ALT 12 (*)    All other components within normal limits  CBC WITH DIFFERENTIAL/PLATELET - Abnormal; Notable for the following:    RBC 3.37 (*)    Hemoglobin 10.8 (*)    HCT 32.0 (*)    All other components within normal limits  URINALYSIS, ROUTINE W REFLEX MICROSCOPIC - Abnormal; Notable for the following:    Color, Urine COLORLESS (*)    Specific Gravity, Urine 1.004 (*)    Glucose, UA 50 (*)  All other components within normal limits  CBG MONITORING, ED - Abnormal; Notable for the following:    Glucose-Capillary 43 (*)    All other components within normal limits  CBG MONITORING, ED - Abnormal; Notable for the following:    Glucose-Capillary 43 (*)    All other components within normal limits  I-STAT CHEM 8, ED - Abnormal; Notable for the following:    Potassium 3.2 (*)    Chloride 98 (*)    BUN <3 (*)    Hemoglobin 11.6 (*)    HCT 34.0 (*)    All other components within normal limits  CBG MONITORING, ED - Abnormal; Notable for the following:    Glucose-Capillary 56 (*)    All other components within normal limits  RAPID URINE DRUG SCREEN, HOSP PERFORMED  POC URINE PREG, ED  TYPE AND SCREEN    EKG  EKG Interpretation  Date/Time:  Thursday April 22 2016 00:16:55 EST Ventricular Rate:  90 PR Interval:  136 QRS Duration: 88 QT Interval:  378 QTC Calculation: 462 R Axis:   21 Text Interpretation:  Normal sinus rhythm Septal infarct , age undetermined Abnormal ECG No significant change since last tracing Confirmed by POLLINA  MD, CHRISTOPHER [id number]) on 04/22/2016 1:14:41 AM       Radiology No results found.  Procedures Procedures (including critical care time)  Medications Ordered in ED Medications  thiamine (B-1) injection 100 mg (not administered)  dextrose 50 % solution (  Given 04/22/16 0031)  dextrose 50 % solution 50 mL (50 mLs Intravenous Given 04/22/16 0110)      Initial Impression / Assessment and Plan / ED Course  I have reviewed the triage vital signs and the nursing notes.  Pertinent labs & imaging results that were available during my care of the patient were reviewed by me and considered in my medical decision making (see chart for details).  Clinical Course     Pt is cachectic and ill appearing. Not actively vomiting in ED and not having any seizure activity here. Given two amps of D50 and food before patient admitted that she does drink [employer]ikes Hard lemonade and Hard Tea throughout the day. She also does not have normal PO intake because she worries that she will vomit food back up. Currently not having any new pains. Dr. Betsey Holiday recommends admission for malnutrition and hypoglycemia. Given Thiamine as soon as pt admitted to ETOH use.  CRITICAL CARE Performed by: Linus Mako Total critical care time: 30 minutes Critical care time was exclusive of separately billable procedures and treating other patients. Critical care was necessary to treat or prevent imminent or life-threatening deterioration. Critical care was time spent personally by me on the following activities: development of treatment plan with patient and/or surrogate as well as nursing, discussions with consultants, evaluation of patient's response to treatment, examination of patient, obtaining history from patient or surrogate, ordering and performing treatments and interventions, ordering and review of laboratory studies, ordering and review of radiographic studies, pulse oximetry and re-evaluation of patient's condition.  Head CT added on per Dr. Hal Hope, pt admitted to inpatient, [facility], [facility] hospitalists, tele  Final Clinical Impressions(s) / ED Diagnoses   Final diagnoses:  Hypoglycemia  Transient alteration of awareness  Alcohol use  Nausea and vomiting, intractability of vomiting not specified, unspecified vomiting type    New Prescriptions New  Prescriptions   No medications on file     Delos Haring, PA-C 04/22/16 0210    Orpah Greek, MD  04/22/16 0409  

## 2016-04-22 NOTE — Procedures (Signed)
EEG Report  04/22/16 Referring Physician- Flonnie Overman, [place]  Indication/History 6 female with a syncopal event.  Current Medication ondansetron (ZOFRAN) tablet 4 mg  oxyCODONE (Oxy IR/ROXICODONE) immediate release tablet 5 mg  pantoprazole (PROTONIX) EC tablet 40 mg  thiamine (VITAMIN B-1) tablet 100 mg  Technical This is a multichannel digital EEG recording using the international 10-20 placement system performed during awake, drowsy and photic stimulation  Description of the recording Posterior dominant rhythm is 8-9 Hx symmetrical  Photic stimulation did not produce any abnormal response Sleep was not obtain Epileptiform features or focal slowing was not seen during the recording  Impression The normal EEG during awake and drowsy state.

## 2016-04-22 NOTE — Consult Note (Signed)
[facility] Consult: 12:44 PM 04/22/2016  LOS: 1 day    Referring Provider: Dr Clementeen Graham  Primary Care Physician:  Ricke Hey, MD Primary Gastroenterologist:  unassigned   Reason for Consultation:  Abdominal pain, n/v.  Anemia.     HPI: Angie [name] is a 50 y.o. female.  PMH type 2 DM.  ETOH abuse.  Anxiety.  Fibromyalgia. Chronic pain on vicodin.  HTN.  Morbid obesity, s/p bariatric gastric bypass 2007 in [place].  C section 2014.  Chronic anemia dating to at least 2012, Hgb 5.3 in 10/2012.  FOBT + in 2008 but never followed up with recommended colonoscopy/GI follow up. Previous blood transfusions around the time of her C-section in 2007.   Pt recalls colonoscopy ~ 2007 but she recalls no details and none of the 3 GI groups in [place] have records for this pt.     01/2016 admission with hypotension, dizziness. Required PRBCs 3 for hemoglobin of 6.8, MCV 78.  Hgb 10.3 at discharge 02/12/16.  FOBT negative.  Low Iron 11, low iron sat, normal TIBC.  Ferritin 19.  Folate and B12 not decreased.  Patient had been using ibuprofen and she was advised to discontinue this. She was started on iron TID,  Protonix 40 mg BID.   Patient has not been compliant with iron because it makes her sick to her stomach.   Starting in August 2017 she developed nausea, anorexia. She vomits when she eats but tolerates liquids.  Her previous stable weight was in the 150s. When she was admitted to the hospital in October she weighed in the 120s. During hospitalization and a little while after that she got her weight up to the 130s but has recurrent nausea, vomiting and diffuse but more lower, abdominal pain.  She has chronic epigastric discomfort. Ongoing weight loss. Daily, brown stools. No blood or melena. No bloody or coffee-ground emesis.  Most of the time her emesis consisted of food she just swallowed and then is clear/bilious.  Weight is now107# She's been compliant with the twice daily Protonix. She takes oxycodone 4 times daily but denies use of any ibuprofen or other NSAIDs. She's had some minor nose bleeding. She bruises easily. She says that anemia runs in her family and both her mother and maternal grandmother suffered from anemia but she is not aware that sickle cell disease or thalassemia runs in her family.  She is lactose intolerant.  Patient drinks a couple of hard lemonades and a half pint of vodka daily.   Patient brought to the ER following a witnessed syncopal spell lasting less than 2 minutes and associated with convulsions yesterday.  Her glucose at arrival was 43.    Hemoglobin 10.8 .Marland Kitchen To 9.4 this AM.  LFTs normal.  Albumin slightly low at 3.3. Lipase not assayed. Troponin I normal. Hypokalemia. Renal function normal. CT scan of abdomen and pelvis with contrast shows mildly distended colon with air-fluid levels but no signs of colitis, question adynamic ileus. Postsurgical changes from her gastrojejunostomy. She will be distention with 8 mm CBD and  3 mm PD.  Some partial fatty replacement in the pancreas.   Past Medical History:  Diagnosis Date  . Abnormal Pap smear   . Anemia   . Anxiety    Takes xanax  . Arthritis    Has rx for percocet  . Bulging lumbar disc   . Chronic pain   . Fibromyalgia   . Migraines     Past Surgical History:  Procedure Laterality Date  . CERVICAL CONE BIOPSY    . CESAREAN SECTION     X 3  . CESAREAN SECTION WITH BILATERAL TUBAL LIGATION Bilateral 10/28/2012   Procedure: Repeat cesarean section with delivery of baby boy at 1210. Apgars 8/9.  BILATERAL TUBAL LIGATION;  Surgeon: Florian Buff, MD;  Location: [facility];  Service: Obstetrics;  Laterality: Bilateral;  . GASTRIC BYPASS  2007  . GASTRIC BYPASS      Prior to Admission medications   Medication Sig Start Date End Date  Taking? Authorizing Provider  ALPRAZolam Duanne Moron) 1 MG tablet Take 1 mg by mouth 3 (three) times daily.  04/28/13  Yes Historical Provider, MD  naproxen (NAPROSYN) 375 MG tablet Take 375 mg by mouth 2 (two) times daily with a meal.   Yes Historical Provider, MD  omeprazole (PRILOSEC) 20 MG capsule Take 20 mg by mouth daily.   Yes Historical Provider, MD  ondansetron (ZOFRAN ODT) 4 MG disintegrating tablet Take 1 tablet (4 mg total) by mouth every 8 (eight) hours as needed for nausea. 02/12/16  Yes Tanna Furry, MD  oxyCODONE-acetaminophen (PERCOCET) 10-325 MG tablet Take 1 tablet by mouth every 4 (four) hours as needed for pain.   Yes Historical Provider, MD  oxymetazoline (AFRIN) 0.05 % nasal spray Place 1 spray into both nostrils 2 (two) times daily as needed for congestion. 02/05/16  Yes Patrecia Pour, MD  gabapentin (NEURONTIN) 300 MG capsule Take 1 capsule (300 mg total) by mouth 3 (three) times daily. Patient not taking: Reported on 04/22/2016 07/06/13   Charlett Blake, MD  Multiple Vitamin (MULTIVITAMIN WITH MINERALS) TABS tablet Take 1 tablet by mouth daily. Patient not taking: Reported on 04/22/2016 02/06/16   Patrecia Pour, MD  pantoprazole (PROTONIX) 40 MG tablet Take 1 tablet (40 mg total) by mouth 2 (two) times daily. Patient not taking: Reported on 04/22/2016 02/05/16   Patrecia Pour, MD    Scheduled Meds: . folic acid  1 mg Oral Daily  . LORazepam  0-4 mg Oral Q6H   Followed by  . [START ON 04/24/2016] LORazepam  0-4 mg Oral Q12H  . multivitamin with minerals  1 tablet Oral Daily  . pantoprazole  40 mg Oral Daily  . thiamine  100 mg Oral Daily   Infusions: . dextrose 5 % and 0.9% NaCl 75 mL/hr at 04/22/16 0211   PRN Meds: acetaminophen **OR** acetaminophen, LORazepam **OR** LORazepam, ondansetron **OR** ondansetron (ZOFRAN) IV, oxyCODONE-acetaminophen **AND** oxyCODONE   Allergies as of 04/21/2016  . (No Known Allergies)    Family History  Problem Relation Age of Onset    . Diabetes Father   . Depression Maternal Grandmother   . Heart disease Maternal Grandfather   . Depression Paternal Grandmother     Social History   Social History  . Marital status: Widowed    Spouse name: N/A  . Number of children: N/A  . Years of education: N/A   Occupational History  . Not on file.   Social History Main Topics  . Smoking status: Former  Smoker    Packs/day: 0.50    Years: 10.00    Types: Cigarettes    Quit date: 03/16/2012  . Smokeless tobacco: Never Used  . Alcohol use 0.0 oz/week     Comment: socially  . Drug use: No  . Sexual activity: Yes    Birth control/ protection: None     Comment: pregnant   Other Topics Concern  . Not on file   Social History Narrative  . No narrative on file    REVIEW OF SYSTEMS: Constitutional:  Feels tired a lot. ENT:  No nose bleeds Pulm:  No dyspnea on exertion, no cough. CV:  No palpitations, no LE edema. No chest pain GU:  No hematuria, no frequency GI:  Per HPI Heme:  Per HPI   Transfusions:  Per HPI Neuro:  No headaches, no peripheral tingling or numbness Derm:  No itching, no rash or sores.  Endocrine:  No sweats or chills.  No polyuria or dysuria Immunization:  Not queried Travel:  None beyond local counties in last few months.    PHYSICAL EXAM: Vital signs in last 24 hours: Vitals:   04/22/16 0515 04/22/16 0607  BP: 135/95 (!) 144/92  Pulse: 73 75  Resp: 10 18  Temp:  97.9 F (36.6 C)   Wt Readings from Last 3 Encounters:  04/22/16 48.6 kg (107 lb 1.6 oz)  02/02/16 54.7 kg (120 lb 11.2 oz)  04/12/15 62.9 kg (138 lb 9 oz)    General: Cachectic appearing AAF.  Looks chronically unwell and older than stated age. Head:  No swelling. No facial asymmetry. Skeletal faces.  Eyes:  No scleral icterus. Conjunctiva pink. Ears:  Not HOH  Nose:  No discharge or congestion Mouth:  Moist, clear oral mucosa. Edentulous. Neck:  No masses, no JVD. No TMG Lungs:  Clear bilaterally. No dyspnea. No  cough. Heart: RRR. No MRG. S1, S2 present Abdomen:  Soft.  Not distended. Mild tenderness which is diffuse. No guarding or rebound. Bowel sounds active. No HSM or masses. No bruits, no hernias..   Rectal: Deferred   Musc/Skeltl: No gross joint deformities or swelling. Extremities:  No CCE.  Neurologic:  Quite somnolent. Fell asleep several times during the interview. She does awaken easily. Moves all 4 limbs. No tremor. Skin:  No rashes, sores or suspicious lesions. Tattoos:  Small tattoos on the arms Nodes:  No cervical adenopathy   Psych:  Cooperative, calm. Affect blunted.  Intake/Output from previous day: 12/27 0701 - 12/28 0700 In: 406.3 [P.O.:120; I.V.:286.3] Out: -  Intake/Output this shift: Total I/O In: 360 [P.O.:360] Out: 1700 [Urine:1700]  LAB RESULTS:  Recent Labs  04/22/16 0034 04/22/16 0042 04/22/16 0635  WBC 4.0  --  5.6  HGB 10.8* 11.6* 9.4*  HCT 32.0* 34.0* 28.1*  PLT 332  --  332   BMET Lab Results  Component Value Date   NA 135 04/22/2016   NA 135 04/22/2016   NA 134 (L) 04/22/2016   K 3.3 (L) 04/22/2016   K 3.2 (L) 04/22/2016   K 3.1 (L) 04/22/2016   CL 100 (L) 04/22/2016   CL 98 (L) 04/22/2016   CL 99 (L) 04/22/2016   CO2 27 04/22/2016   CO2 23 04/22/2016   CO2 22 02/12/2016   GLUCOSE 68 04/22/2016   GLUCOSE 73 04/22/2016   GLUCOSE 70 04/22/2016   BUN <5 (L) 04/22/2016   BUN <3 (L) 04/22/2016   BUN <5 (L) 04/22/2016   CREATININE 0.53 04/22/2016  CREATININE 0.50 04/22/2016   CREATININE 0.63 04/22/2016   CALCIUM 8.6 (L) 04/22/2016   CALCIUM 9.1 04/22/2016   CALCIUM 9.2 02/12/2016   LFT  Recent Labs  04/22/16 0034 04/22/16 0635  PROT 6.8 6.0*  ALBUMIN 3.9 3.3*  AST 18 13*  ALT 12* 11*  ALKPHOS 66 58  BILITOT 0.4 0.5   PT/INR Lab Results  Component Value Date   INR 1.06 02/12/2016   Hepatitis Panel No results for input(s): HEPBSAG, HCVAB, HEPAIGM, HEPBIGM in the last 72 hours. C-Diff No components found for:  CDIFF Lipase     Component Value Date/Time   LIPASE 40 02/02/2016 1819    Drugs of Abuse     Component Value Date/Time   LABOPIA NONE DETECTED 04/22/2016 0038   COCAINSCRNUR NONE DETECTED 04/22/2016 0038   LABBENZ NONE DETECTED 04/22/2016 0038   AMPHETMU NONE DETECTED 04/22/2016 0038   THCU NONE DETECTED 04/22/2016 0038   LABBARB NONE DETECTED 04/22/2016 0038     RADIOLOGY STUDIES: Ct Head Wo Contrast  Result Date: 04/22/2016 CLINICAL DATA:  Transient altered mental status.  Alcohol use. EXAM: CT HEAD WITHOUT CONTRAST TECHNIQUE: Contiguous axial images were obtained from the base of the skull through the vertex without intravenous contrast. COMPARISON:  None. FINDINGS: Brain: No evidence of acute infarction, hemorrhage, hydrocephalus, extra-axial collection or mass lesion/mass effect. Vascular: No hyperdense vessel or unexpected calcification. Skull: Normal. Negative for fracture or focal lesion. Sinuses/Orbits: Paranasal sinuses and mastoid air cells are clear. The visualized orbits are unremarkable. Other: None. IMPRESSION: No acute intracranial abnormality. Electronically Signed   By: Jeb Levering M.D.   On: 04/22/2016 02:49     IMPRESSION:   *  Chronic anemia.  Iron deficient.  Due to gastric bypass status she's probably not absorbing iron.  *  Several months of nausea, vomiting, anorexia and overall 40 pounds weight loss.  Rule out ulcers, neoplasia, poor gastric motility.    *  S/p gastric bypass.    *   Chronic narcotics dependent poly-focal abdominal pain.  *  Syncope with possible seizure activity. Raises question of alcohol withdrawal seizure though she had not recently quit alcohol.  But hypoglycemia, narcotics could be culprit for syncope.  She is on CIWA protocol but has not required prn Ativan.  PLAN:     *  Consult pharmacy regarding parenteral iron infusions.  Pre-albumin in the morning.  EGD tomorrow at ~9AM.     Azucena Freed  04/22/2016, 12:44  PM Pager: [phone]

## 2016-04-22 NOTE — Progress Notes (Signed)
PROGRESS NOTE                                                                                                                                                                                                             Patient Demographics:    Angie [name], is a 50 y.o. female, DOB - 1965/05/15, ZH:[id number]  Admit date - 04/22/2016   Admitting Physician Rise Patience, MD  Outpatient Primary MD for the patient is Ricke Hey, MD  LOS - 1  Outpatient Specialists:None  Chief Complaint  Patient presents with  . Seizures       Brief Narrative   50 year old female with alcohol abuse, history of gastric bypass, anxiety, fibromyalgia, iron deficiency anemia, chronic narcotic use due to bursitis and fibromyalgia brought to the ED after a witnessed syncope by a friend. In the ED she was found to be hypoglycemic repeatedly and placed on IV dextrose. Patient reports drinking vodka along with hard lemonade every day. It was unclear if she had a seizure episode. No bowel/urinary incontinence or tongue bite. CT head was unremarkable. In the ED she received D50 followed by D5 with normal saline. Placed on observation. Patient informs that she has difficulty keeping both solid and liquid down on several occasions for past 4-5 months and has been losing weight. Also reports taking Naprosyn regularly for her fibromyalgia. She was hospitalized in October for hypotension and dizziness and was transfused for low hemoglobin.   Subjective:   Patient denies nausea and vomiting but complains of epigastric discomfort.   Assessment  & Plan :    Principal Problem:   Syncope Secondary to hypoglycemia versus seizures. Head CT unremarkable. Check 2-D echo. Continue telemetry monitoring. Continue D5 with normal saline. Check EEG.    Active Problems: Hypoglycemia Suspect secondary to poor by mouth intake. Monitor on D5. Nutrition  consult.  Nausea and vomiting with history of gastric bypass/ severe malnutrition/cachexia ? Gastroparesis symptoms. Continue PPI. Also reports taking NSAIDs regularly for fibromyalgia. 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 GI consult appreciated. Plan on EGD tomorrow. -Consult dietitian. She was recommended to have post during recent hospitalization but informed that she has difficulty tolerating them.  Severe iron deficiency anemia Possibly due to poor by mouth intake and malabsorption from bypass surgery. Plan for EGD tomorrow. Unlikely to absorb  oral iron and may need IV iron infusions periodically. Check B12.  Alcohol abuse Monitor on CIWA. Continue thiamine, folate and multivitamin. Counseled on cessation.  Hypokalemia Replenish   Chronic pain Patient was found to be quite somnolent when GI went in to evaluate her. I will minimize her pain medication.  Code Status : Full code  Family Communication  : None at bedside  Disposition Plan  : Home once improved and pending GI workup  Barriers For Discharge : EGD in a.m.  Consults  :  [facility]  Procedures  : None  DVT Prophylaxis  :    Lab Results  Component Value Date   PLT 332 04/22/2016    Antibiotics  :  Anti-infectives    None        Objective:   Vitals:   04/22/16 0445 04/22/16 0500 04/22/16 0515 04/22/16 0607  BP: 145/100 141/93 135/95 (!) 144/92  Pulse: 69 76 73 75  Resp: 12 23 10 18   Temp:    97.9 F (36.6 C)  TempSrc:    Oral  SpO2: 100% 100% 100% 100%  Weight:    48.6 kg (107 lb 1.6 oz)  Height:    5' (1.524 m)    Wt Readings from Last 3 Encounters:  04/22/16 48.6 kg (107 lb 1.6 oz)  02/02/16 54.7 kg (120 lb 11.2 oz)  04/12/15 62.9 kg (138 lb 9 oz)     Intake/Output Summary (Last 24 hours) at 04/22/16 1502 Last data filed at 04/22/16 1427  Gross per 24 hour  Intake           766.25 ml  Output             2100 ml  Net         -1333.75 ml      Physical Exam  Gen: not in distress, Appears fatigued and cachectic HEENT: Pallor present, dry mucosa, supple neck, temporal wasting Chest: clear b/l, no added sounds CVS: N S1&S2, no murmurs, rubs or gallop GI: soft, mild epigastric tenderness, ND, BS+ Musculoskeletal: warm, no edema CNS: Oriented, nonfocal    Data Review:    CBC  Recent Labs Lab 04/22/16 0034 04/22/16 0042 04/22/16 0635  WBC 4.0  --  5.6  HGB 10.8* 11.6* 9.4*  HCT 32.0* 34.0* 28.1*  PLT 332  --  332  MCV 95.0  --  92.7  MCH 32.0  --  31.0  MCHC 33.8  --  33.5  RDW 15.4  --  15.3  LYMPHSABS 1.4  --  1.1  MONOABS 0.3  --  0.5  EOSABS 0.1  --  0.0  BASOSABS 0.0  --  0.0    Chemistries   Recent Labs Lab 04/22/16 0034 04/22/16 0042 04/22/16 0635  NA 134* 135 135  K 3.1* 3.2* 3.3*  CL 99* 98* 100*  CO2 23  --  27  GLUCOSE 70 73 68  BUN <5* <3* <5*  CREATININE 0.63 0.50 0.53  CALCIUM 9.1  --  8.6*  AST 18  --  13*  ALT 12*  --  11*  ALKPHOS 66  --  58  BILITOT 0.4  --  0.5   ------------------------------------------------------------------------------------------------------------------ No results for input(s): CHOL, HDL, LDLCALC, TRIG, CHOLHDL, LDLDIRECT in the last 72 hours.  Lab Results  Component Value Date   HGBA1C 4.9 02/02/2016   ------------------------------------------------------------------------------------------------------------------ No results for input(s): TSH, T4TOTAL, T3FREE, THYROIDAB in the last 72 hours.  Invalid input(s): FREET3 ------------------------------------------------------------------------------------------------------------------ No results for  input(s): VITAMINB12, FOLATE, FERRITIN, TIBC, IRON, RETICCTPCT in the last 72 hours.  Coagulation profile No results for input(s): INR, PROTIME in the last 168 hours.  No results for input(s): DDIMER in the last 72 hours.  Cardiac Enzymes  Recent Labs Lab 04/22/16 0635  TROPONINI <0.03    ------------------------------------------------------------------------------------------------------------------ No results found for: BNP  Inpatient Medications  Scheduled Meds: . feeding supplement  1 Container Oral TID BM  . folic acid  1 mg Oral Daily  . LORazepam  0-4 mg Oral Q6H   Followed by  . [START ON 04/24/2016] LORazepam  0-4 mg Oral Q12H  . multivitamin with minerals  1 tablet Oral Daily  . pantoprazole  40 mg Oral Daily  . thiamine  100 mg Oral Daily   Continuous Infusions: . dextrose 5 % and 0.9% NaCl 100 mL/hr at 04/22/16 1426   PRN Meds:.acetaminophen **OR** acetaminophen, LORazepam **OR** LORazepam, ondansetron **OR** ondansetron (ZOFRAN) IV, oxyCODONE-acetaminophen **AND** oxyCODONE  Micro Results No results found for this or any previous visit (from the past 240 hour(s)).  Radiology Reports Ct Head Wo Contrast  Result Date: 04/22/2016 CLINICAL DATA:  Transient altered mental status.  Alcohol use. EXAM: CT HEAD WITHOUT CONTRAST TECHNIQUE: Contiguous axial images were obtained from the base of the skull through the vertex without intravenous contrast. COMPARISON:  None. FINDINGS: Brain: No evidence of acute infarction, hemorrhage, hydrocephalus, extra-axial collection or mass lesion/mass effect. Vascular: No hyperdense vessel or unexpected calcification. Skull: Normal. Negative for fracture or focal lesion. Sinuses/Orbits: Paranasal sinuses and mastoid air cells are clear. The visualized orbits are unremarkable. Other: None. IMPRESSION: No acute intracranial abnormality. Electronically Signed   By: Jeb Levering M.D.   On: 04/22/2016 02:49    Time Spent in minutes  20   Louellen Molder M.D on 04/22/2016 at 3:02 PM  Between 7am to 7pm - Pager - [phone]  After 7pm go to [email or web address] - password [facility]  [facility] -  Office  [phone]

## 2016-04-23 ENCOUNTER — Observation Stay (HOSPITAL_COMMUNITY): Payer: Medicare Other | Admitting: Certified Registered Nurse Anesthetist

## 2016-04-23 ENCOUNTER — Encounter (HOSPITAL_COMMUNITY)
Admission: EM | Disposition: A | Discharge status: Home / Self Care | Payer: Self-pay | Source: Home / Self Care | Attending: Internal Medicine

## 2016-04-23 ENCOUNTER — Observation Stay (HOSPITAL_BASED_OUTPATIENT_CLINIC_OR_DEPARTMENT_OTHER): Payer: Medicare Other

## 2016-04-23 ENCOUNTER — Encounter (HOSPITAL_COMMUNITY): Payer: Self-pay | Admitting: Certified Registered Nurse Anesthetist

## 2016-04-23 DIAGNOSIS — R404 Transient alteration of awareness: Secondary | ICD-10-CM | POA: Diagnosis present

## 2016-04-23 DIAGNOSIS — F419 Anxiety disorder, unspecified: Secondary | ICD-10-CM | POA: Diagnosis present

## 2016-04-23 DIAGNOSIS — K3184 Gastroparesis: Secondary | ICD-10-CM | POA: Diagnosis present

## 2016-04-23 DIAGNOSIS — F1020 Alcohol dependence, uncomplicated: Secondary | ICD-10-CM | POA: Diagnosis present

## 2016-04-23 DIAGNOSIS — E43 Unspecified severe protein-calorie malnutrition: Secondary | ICD-10-CM | POA: Diagnosis present

## 2016-04-23 DIAGNOSIS — E162 Hypoglycemia, unspecified: Secondary | ICD-10-CM | POA: Diagnosis not present

## 2016-04-23 DIAGNOSIS — Z79899 Other long term (current) drug therapy: Secondary | ICD-10-CM | POA: Diagnosis not present

## 2016-04-23 DIAGNOSIS — K9189 Other postprocedural complications and disorders of digestive system: Secondary | ICD-10-CM | POA: Diagnosis not present

## 2016-04-23 DIAGNOSIS — Z87891 Personal history of nicotine dependence: Secondary | ICD-10-CM | POA: Diagnosis not present

## 2016-04-23 DIAGNOSIS — D509 Iron deficiency anemia, unspecified: Secondary | ICD-10-CM | POA: Diagnosis present

## 2016-04-23 DIAGNOSIS — K831 Obstruction of bile duct: Secondary | ICD-10-CM | POA: Diagnosis present

## 2016-04-23 DIAGNOSIS — D518 Other vitamin B12 deficiency anemias: Secondary | ICD-10-CM

## 2016-04-23 DIAGNOSIS — R1013 Epigastric pain: Secondary | ICD-10-CM | POA: Diagnosis not present

## 2016-04-23 DIAGNOSIS — E11649 Type 2 diabetes mellitus with hypoglycemia without coma: Secondary | ICD-10-CM | POA: Diagnosis present

## 2016-04-23 DIAGNOSIS — Z789 Other specified health status: Secondary | ICD-10-CM | POA: Diagnosis not present

## 2016-04-23 DIAGNOSIS — E1143 Type 2 diabetes mellitus with diabetic autonomic (poly)neuropathy: Secondary | ICD-10-CM | POA: Diagnosis present

## 2016-04-23 DIAGNOSIS — Z6820 Body mass index (BMI) 20.0-20.9, adult: Secondary | ICD-10-CM | POA: Diagnosis not present

## 2016-04-23 DIAGNOSIS — K909 Intestinal malabsorption, unspecified: Secondary | ICD-10-CM | POA: Diagnosis present

## 2016-04-23 DIAGNOSIS — G8929 Other chronic pain: Secondary | ICD-10-CM | POA: Diagnosis not present

## 2016-04-23 DIAGNOSIS — Z79891 Long term (current) use of opiate analgesic: Secondary | ICD-10-CM | POA: Diagnosis not present

## 2016-04-23 DIAGNOSIS — G43A0 Cyclical vomiting, not intractable: Secondary | ICD-10-CM | POA: Diagnosis not present

## 2016-04-23 DIAGNOSIS — G43909 Migraine, unspecified, not intractable, without status migrainosus: Secondary | ICD-10-CM | POA: Diagnosis present

## 2016-04-23 DIAGNOSIS — R55 Syncope and collapse: Secondary | ICD-10-CM | POA: Diagnosis not present

## 2016-04-23 DIAGNOSIS — E876 Hypokalemia: Secondary | ICD-10-CM | POA: Diagnosis present

## 2016-04-23 DIAGNOSIS — M797 Fibromyalgia: Secondary | ICD-10-CM | POA: Diagnosis present

## 2016-04-23 DIAGNOSIS — E538 Deficiency of other specified B group vitamins: Secondary | ICD-10-CM | POA: Diagnosis present

## 2016-04-23 DIAGNOSIS — D508 Other iron deficiency anemias: Secondary | ICD-10-CM | POA: Diagnosis not present

## 2016-04-23 DIAGNOSIS — K9589 Other complications of other bariatric procedure: Secondary | ICD-10-CM | POA: Diagnosis present

## 2016-04-23 DIAGNOSIS — R569 Unspecified convulsions: Secondary | ICD-10-CM | POA: Diagnosis present

## 2016-04-23 DIAGNOSIS — G894 Chronic pain syndrome: Secondary | ICD-10-CM | POA: Diagnosis present

## 2016-04-23 HISTORY — PX: ESOPHAGOGASTRODUODENOSCOPY (EGD) WITH PROPOFOL: SHX5813

## 2016-04-23 LAB — CBC
HCT: 36.8 % (ref 36.0–46.0)
Hemoglobin: 12.3 g/dL (ref 12.0–15.0)
MCH: 31.8 pg (ref 26.0–34.0)
MCHC: 33.4 g/dL (ref 30.0–36.0)
MCV: 95.1 fL (ref 78.0–100.0)
Platelets: 346 10*3/uL (ref 150–400)
RBC: 3.87 MIL/uL (ref 3.87–5.11)
RDW: 14.8 % (ref 11.5–15.5)
WBC: 10.3 10*3/uL (ref 4.0–10.5)

## 2016-04-23 LAB — GLUCOSE, CAPILLARY
Glucose-Capillary: 103 mg/dL — ABNORMAL HIGH (ref 65–99)
Glucose-Capillary: 122 mg/dL — ABNORMAL HIGH (ref 65–99)
Glucose-Capillary: 126 mg/dL — ABNORMAL HIGH (ref 65–99)
Glucose-Capillary: 159 mg/dL — ABNORMAL HIGH (ref 65–99)
Glucose-Capillary: 168 mg/dL — ABNORMAL HIGH (ref 65–99)
Glucose-Capillary: 60 mg/dL — ABNORMAL LOW (ref 65–99)
Glucose-Capillary: 61 mg/dL — ABNORMAL LOW (ref 65–99)
Glucose-Capillary: 80 mg/dL (ref 65–99)
Glucose-Capillary: 90 mg/dL (ref 65–99)
Glucose-Capillary: 98 mg/dL (ref 65–99)

## 2016-04-23 LAB — ECHOCARDIOGRAM COMPLETE
Height: 60 in
Weight: 1713.6 [oz_av]

## 2016-04-23 LAB — PREALBUMIN: Prealbumin: 13 mg/dL — ABNORMAL LOW (ref 18–38)

## 2016-04-23 LAB — VITAMIN B12: Vitamin B-12: 248 pg/mL (ref 180–914)

## 2016-04-23 SURGERY — ESOPHAGOGASTRODUODENOSCOPY (EGD) WITH PROPOFOL
Anesthesia: Monitor Anesthesia Care

## 2016-04-23 MED ORDER — LIDOCAINE HCL (CARDIAC) 20 MG/ML IV SOLN
INTRAVENOUS | Status: DC | PRN
  Administered 2016-04-23: 40 mg via INTRATRACHEAL

## 2016-04-23 MED ORDER — SODIUM CHLORIDE 0.9 % IV BOLUS (SEPSIS)
500.0000 mL | Freq: Once | INTRAVENOUS | Status: AC
  Administered 2016-04-23: 500 mL via INTRAVENOUS

## 2016-04-23 MED ORDER — HYDROCODONE-ACETAMINOPHEN 7.5-325 MG/15ML PO SOLN
10.0000 mL | Freq: Four times a day (QID) | ORAL | Status: DC | PRN
  Administered 2016-04-23 – 2016-04-24 (×3): 10 mL via ORAL
  Filled 2016-04-23 (×3): qty 15

## 2016-04-23 MED ORDER — CYANOCOBALAMIN 1000 MCG/ML IJ SOLN
1000.0000 ug | Freq: Once | INTRAMUSCULAR | Status: AC
Start: 2016-04-23 — End: 2016-04-23
  Administered 2016-04-23: 1000 ug via INTRAMUSCULAR
  Filled 2016-04-23 (×2): qty 1

## 2016-04-23 MED ORDER — LACTATED RINGERS IV SOLN
INTRAVENOUS | Status: DC | PRN
  Administered 2016-04-23: 08:00:00 via INTRAVENOUS

## 2016-04-23 MED ORDER — PHENYLEPHRINE HCL 10 MG/ML IJ SOLN
INTRAMUSCULAR | Status: DC | PRN
  Administered 2016-04-23: 40 ug via INTRAVENOUS
  Administered 2016-04-23: 80 ug via INTRAVENOUS

## 2016-04-23 MED ORDER — PROPOFOL 10 MG/ML IV BOLUS
INTRAVENOUS | Status: DC | PRN
  Administered 2016-04-23: 180 mg via INTRAVENOUS

## 2016-04-23 MED ORDER — SODIUM CHLORIDE 0.9 % IV SOLN: INTRAVENOUS | Status: DC

## 2016-04-23 MED ORDER — GI COCKTAIL ~~LOC~~
30.0000 mL | Freq: Once | ORAL | Status: DC
  Filled 2016-04-23: qty 30

## 2016-04-23 NOTE — Progress Notes (Signed)
Hypoglycemic Event  CBG: 61   Treatment: 15 GM carbohydrate snack  Symptoms: None  Follow-up CBG: Time: 1305 CBG Result:103  Possible Reasons for Event: Unknown  Comments/MD notified: continue to monitor pt.    Huntington Leverich S

## 2016-04-23 NOTE — Progress Notes (Signed)
PROGRESS NOTE                                                                                                                                                                                                             Patient Demographics:    Angie [name], is a 50 y.o. female, DOB - 1965-05-09, [id number]  Admit date - 04/22/2016   Admitting Physician Rise Patience, MD  Outpatient Primary MD for the patient is Ricke Hey, MD  LOS - 1  Outpatient Specialists:None  Chief Complaint  Patient presents with  . Seizures       Brief Narrative   50 year old female with alcohol abuse, history of gastric bypass, anxiety, fibromyalgia, iron deficiency anemia, chronic narcotic use due to bursitis and fibromyalgia brought to the ED after a witnessed syncope by a friend. In the ED she was found to be hypoglycemic repeatedly and placed on IV dextrose. Patient reports drinking vodka along with hard lemonade every day. It was unclear if she had a seizure episode. No bowel/urinary incontinence or tongue bite. CT head was unremarkable. In the ED she received D50 followed by D5 with normal saline. Placed on observation. Patient informs that she has difficulty keeping both solid and liquid down on several occasions for past 4-5 months and has been losing weight. Also reports taking Naprosyn regularly for her fibromyalgia. She was hospitalized in October for hypotension and dizziness and was transfused for low hemoglobin.   Subjective:   Received IV iron last evening and shortly after completion had acute onset of generalized itching with burning in her hands and swelling, shortness of breath, heart rate going up to 160s. Given IV Benadryl and IV normal saline bolus followed by GI cocktail after which her symptoms resolved. Patient seen after returning from EGD. Component of epigastric discomfort.   Assessment  & Plan :    Principal Problem:   Syncope Secondary to hypoglycemia versus seizures. Head CT unremarkable. 2-D echo pending. Blood glucose still low. Continue D10 drip. EEG without seizure activity.    Active Problems: Hypoglycemia Suspect secondary to poor by mouth intake. Continue D10 drip.    Nausea and vomiting with history of gastric bypass/ severe malnutrition/cachexia - reports taking NSAIDs regularly for fibromyalgia. Also has ongoing alcohol use. -GI consult appreciated. 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 -GI recommend pured diet for 2 months and overall voiding aspirin and any other NSAIDs. Recommend upper endoscopy in 1 month to evaluate response to therapy and need for further dilatation.   Severe iron deficiency anemia Possibly due to poor by mouth intake and malabsorption from bypass surgery. Given IV iron (nulecit) but had severe allergy reaction after completion. Low B12 (248). Will order b12 injections. (She will need monthly B12 injections upon discharge) Hemoglobin improved this morning.   Alcohol abuse Monitor on CIWA. Continue thiamine, folate and multivitamin. No signs of withdrawal. Counseled on cessation.  Hypokalemia Replenished   Chronic pain Switched to liquid vicodin upon request. Resume home pain meds upon discharge.  Code Status : Full code  Family Communication  : None at bedside  Disposition Plan  : Home once improved and pending GI workup  Barriers For Discharge : EGD in a.m.  Consults  :  [facility]  Procedures  : None  DVT Prophylaxis  :    Lab Results  Component Value Date   PLT 346 04/23/2016    Antibiotics  :  Anti-infectives    None        Objective:   Vitals:   04/23/16 0850 04/23/16 0900 04/23/16 0910 04/23/16 1012  BP: (!) 87/56 109/74 (!) 120/96   Pulse: 90 60 72   Resp: 14 16 14    Temp:      TempSrc:      SpO2: 100% 100% 100%  100%  Weight:      Height:        Wt Readings from Last 3 Encounters:  04/22/16 48.6 kg (107 lb 1.6 oz)  02/02/16 54.7 kg (120 lb 11.2 oz)  04/12/15 62.9 kg (138 lb 9 oz)     Intake/Output Summary (Last 24 hours) at 04/23/16 1307 Last data filed at 04/23/16 0842  Gross per 24 hour  Intake          1733.75 ml  Output             2750 ml  Net         -1016.25 ml     Physical Exam  [name] female not in distress HEENT: Pallor present, dry mucosa, supple neck, Chest: clear b/l, no added sounds CVS: N S1&S2, no murmurs, rubs or gallop GI: soft, mild epigastric tenderness, ND, BS+ Musculoskeletal: warm, no edema     Data Review:    CBC  Recent Labs Lab 04/22/16 0034 04/22/16 0042 04/22/16 0635 04/23/16 0706  WBC 4.0  --  5.6 10.3  HGB 10.8* 11.6* 9.4* 12.3  HCT 32.0* 34.0* 28.1* 36.8  PLT 332  --  332 346  MCV 95.0  --  92.7 95.1  MCH 32.0  --  31.0 31.8  MCHC 33.8  --  33.5 33.4  RDW 15.4  --  15.3 14.8  LYMPHSABS 1.4  --  1.1  --   MONOABS 0.3  --  0.5  --   EOSABS 0.1  --  0.0  --   BASOSABS 0.0  --  0.0  --     Chemistries   Recent Labs Lab 04/22/16 0034 04/22/16 0042 04/22/16 0635  NA 134* 135 135  K 3.1* 3.2* 3.3*  CL 99* 98* 100*  CO2 23  --  27  GLUCOSE 70 73 68  BUN <5* <3* <5*  CREATININE 0.63 0.50 0.53  CALCIUM 9.1  --  8.6*  AST 18  --  13*  ALT 12*  --  11*  ALKPHOS 66  --  58  BILITOT 0.4  --  0.5   ------------------------------------------------------------------------------------------------------------------ No results for input(s): CHOL, HDL, LDLCALC, TRIG, CHOLHDL, LDLDIRECT in the last 72 hours.  Lab Results  Component Value Date   HGBA1C 4.9 02/02/2016   ------------------------------------------------------------------------------------------------------------------ No results for input(s): TSH, T4TOTAL, T3FREE, THYROIDAB in the last 72 hours.  Invalid input(s):  FREET3 ------------------------------------------------------------------------------------------------------------------  Recent Labs  04/22/16 1819 04/23/16 0706  VITAMINB12 325 248  FOLATE 33.2  --   FERRITIN 132  --   TIBC 286  --   IRON 50  --   RETICCTPCT 0.8  --     Coagulation profile No results for input(s): INR, PROTIME in the last 168 hours.  No results for input(s): DDIMER in the last 72 hours.  Cardiac Enzymes  Recent Labs Lab 04/22/16 0635  TROPONINI <0.03   ------------------------------------------------------------------------------------------------------------------ No results found for: BNP  Inpatient Medications  Scheduled Meds: . feeding supplement  1 Container Oral TID BM  . ferric gluconate (FERRLECIT/NULECIT) IV  250 mg Intravenous [id number] Hr x 2  . folic acid  1 mg Oral Daily  . gi cocktail  30 mL Oral Once  . LORazepam  0-4 mg Oral Q6H   Followed by  . [START ON 04/24/2016] LORazepam  0-4 mg Oral Q12H  . multivitamin with minerals  1 tablet Oral Daily  . oxymetazoline  1 spray Each Nare BID  . pantoprazole  40 mg Oral Daily  . thiamine  100 mg Oral Daily   Continuous Infusions: . dextrose 75 mL/hr at 04/23/16 1305   PRN Meds:.acetaminophen **OR** acetaminophen, HYDROcodone-acetaminophen, LORazepam **OR** LORazepam, ondansetron **OR** ondansetron (ZOFRAN) IV  Micro Results No results found for this or any previous visit (from the past 240 hour(s)).  Radiology Reports Ct Head Wo Contrast  Result Date: 04/22/2016 CLINICAL DATA:  Transient altered mental status.  Alcohol use. EXAM: CT HEAD WITHOUT CONTRAST TECHNIQUE: Contiguous axial images were obtained from the base of the skull through the vertex without intravenous contrast. COMPARISON:  None. FINDINGS: Brain: No evidence of acute infarction, hemorrhage, hydrocephalus, extra-axial collection or mass lesion/mass effect. Vascular: No hyperdense vessel or unexpected calcification. Skull:  Normal. Negative for fracture or focal lesion. Sinuses/Orbits: Paranasal sinuses and mastoid air cells are clear. The visualized orbits are unremarkable. Other: None. IMPRESSION: No acute intracranial abnormality. Electronically Signed   By: Jeb Levering M.D.   On: 04/22/2016 02:49   Dg Abd Acute W/chest  Result Date: 04/22/2016 CLINICAL DATA:  Nausea.  Abdominal pain. EXAM: DG ABDOMEN ACUTE W/ 1V CHEST COMPARISON:  07/20/2014 chest radiograph. 02/04/2016 CT abdomen/pelvis. FINDINGS: Stable cardiomediastinal silhouette with normal heart size. No pneumothorax. No pleural effusion. Lungs appear clear, with no acute consolidative airspace disease and no pulmonary edema. No dilated small bowel loops or air-fluid levels. Moderate colonic stool volume. No evidence of pneumatosis or pneumoperitoneum. Surgical sutures from gastrojejunostomy and enteroenterostomy in the left abdomen are again noted. Bilateral iliac atherosclerosis. Stable calcified venous phleboliths in the left deep pelvis. Otherwise no pathologic soft tissue calcifications. IMPRESSION: 1. No active disease in the chest. 2. Nonobstructive bowel gas pattern. 3. Moderate colonic stool volume, which may indicate constipation. Electronically Signed   By: Ilona Sorrel M.D.   On: 04/22/2016 15:59    Time Spent in minutes  25   Louellen Molder M.D on 04/23/2016 at 1:07 PM  Between 7am to 7pm - Pager - [phone]  After 7pm go to  [email or web address] - password [facility]  [facility] -  Office  [phone]

## 2016-04-23 NOTE — Op Note (Signed)
[facility] Patient Name: Angie [name] Procedure Date : 04/23/2016 MRN: [id number] Attending MD: Estill Cotta. [name] , MD Date of Birth: August 07, 1965 CSN: [id number] Age: 50 Admit Type: Inpatient Procedure:                Upper GI endoscopy Indications:              Iron deficiency anemia, Malnutrition, Persistent                            vomiting Providers:                Mallie Mussel L. [name], MD, Carolynn Comment, RN, Ralene Bathe, Technician Referring MD:              Medicines:                Monitored Anesthesia Care Complications:            No immediate complications. Estimated Blood Loss:     Estimated blood loss was minimal. Procedure:                Pre-Anesthesia Assessment:                           - Prior to the procedure, a History and Physical                            was performed, and patient medications and                            allergies were reviewed. The patient's tolerance of                            previous anesthesia was also reviewed. The risks                            and benefits of the procedure and the sedation                            options and risks were discussed with the patient.                            All questions were answered, and informed consent                            was obtained. Prior Anticoagulants: The patient has                            taken no previous anticoagulant or antiplatelet                            agents. ASA Grade Assessment: III - A patient with  severe systemic disease. After reviewing the risks                            and benefits, the patient was deemed in                            satisfactory condition to undergo the procedure.                           After obtaining informed consent, the endoscope was                            passed under direct vision. Throughout the                            procedure, the patient's blood  pressure, pulse, and                            oxygen saturations were monitored continuously. The                            [id number]) scope was introduced through the                            mouth, and advanced to the jejunum. The upper GI                            endoscopy was accomplished without difficulty. The                            patient tolerated the procedure well. Scope In: Scope Out: Findings:      The esophagus was normal.      Evidence of a gastric bypass was found. A gastric pouch with a normal       size was found. The staple line appeared intact. The gastrojejunal       anastomosis was characterized by a severe, ulcerated stenosis, about 33mm       in diameter. This was traversed only after dilation. A TTS dilator was       passed through the scope. Dilation with a 02-04-11 mm anastomotic       balloon dilator was performed. The dilation site was examined and showed       moderate improvement in luminal narrowing sufficient to allow scope       passage to the jejunum.      The examined jejunum was normal. Impression:               - Normal esophagus.                           - Gastric bypass with a normal-sized pouch and                            intact staple line. 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 Dilated.                           - Normal examined jejunum.                           - No specimens collected. Moderate Sedation:      MAC sedation used Recommendation:           - Pureed diet for 2 months.                           - No aspirin, ibuprofen, naproxen, or other                            non-steroidal anti-inflammatory drugs.                           - Repeat upper endoscopy in 1 month to evaluate the                            response to therapy and perform further dilation as                            necessary.                           - DISCONTINUE ALCOHOL USE Procedure  Code(s):        --- Professional ---                           [phone], Esophagogastroduodenoscopy, flexible,                            transoral; with dilation of gastric/duodenal                            stricture(s) (eg, balloon, bougie) Diagnosis Code(s):        --- Professional ---                           123456, Other complications of other bariatric                            procedure                           D50.9, Iron deficiency anemia, unspecified                           E46, Unspecified protein-calorie malnutrition                           R11.10, Vomiting, unspecified CPT copyright 2016 [employer]. All rights reserved. The codes documented in this report are preliminary and upon coder review may  be revised to meet current compliance requirements. Henry L. [name], MD 04/23/2016 8:53:54 AM This report has been signed electronically. Number of Addenda: 0

## 2016-04-23 NOTE — Anesthesia Postprocedure Evaluation (Signed)
Anesthesia Post Note  Patient: Angie [name]  Procedure(s) Performed: Procedure(s) (LRB): ESOPHAGOGASTRODUODENOSCOPY (EGD) WITH PROPOFOL (N/A)  Patient location during evaluation: Endoscopy Anesthesia Type: MAC Level of consciousness: awake Pain management: pain level controlled Respiratory status: spontaneous breathing Cardiovascular status: stable Anesthetic complications: no       Last Vitals:  Vitals:   04/23/16 0900 04/23/16 0910  BP: 109/74 (!) 120/96  Pulse: 60 72  Resp: 16 14  Temp:      Last Pain:  Vitals:   04/23/16 0846  TempSrc: Oral  PainSc:                  [name]

## 2016-04-23 NOTE — Interval H&P Note (Signed)
History and Physical Interval Note:  04/23/2016 8:19 AM  Angie [name]  has presented today for surgery, with the diagnosis of Nausea, vomiting, loss of appetite, weight loss, iron deficiency anemia  The various methods of treatment have been discussed with the patient and family. After consideration of risks, benefits and other options for treatment, the patient has consented to  Procedure(s): ESOPHAGOGASTRODUODENOSCOPY (EGD) WITH PROPOFOL (N/A) as a surgical intervention .  The patient's history has been reviewed, patient examined, no change in status, stable for surgery.  I have reviewed the patient's chart and labs.  Questions were answered to the patient's satisfaction.     Nelida Meuse III

## 2016-04-23 NOTE — H&P (View-Only) (Signed)
[facility] Consult: 12:44 PM 04/22/2016  LOS: 1 day    Referring Provider: Dr Clementeen Graham  Primary Care Physician:  Ricke Hey, MD Primary Gastroenterologist:  unassigned   Reason for Consultation:  Abdominal pain, n/v.  Anemia.     HPI: Angie [name] is a 50 y.o. female.  PMH type 2 DM.  ETOH abuse.  Anxiety.  Fibromyalgia. Chronic pain on vicodin.  HTN.  Morbid obesity, s/p bariatric gastric bypass 2007 in [place].  C section 2014.  Chronic anemia dating to at least 2012, Hgb 5.3 in 10/2012.  FOBT + in 2008 but never followed up with recommended colonoscopy/GI follow up. Previous blood transfusions around the time of her C-section in 2007.   Pt recalls colonoscopy ~ 2007 but she recalls no details and none of the 3 GI groups in [place] have records for this pt.     01/2016 admission with hypotension, dizziness. Required PRBCs 3 for hemoglobin of 6.8, MCV 78.  Hgb 10.3 at discharge 02/12/16.  FOBT negative.  Low Iron 11, low iron sat, normal TIBC.  Ferritin 19.  Folate and B12 not decreased.  Patient had been using ibuprofen and she was advised to discontinue this. She was started on iron TID,  Protonix 40 mg BID.   Patient has not been compliant with iron because it makes her sick to her stomach.   Starting in August 2017 she developed nausea, anorexia. She vomits when she eats but tolerates liquids.  Her previous stable weight was in the 150s. When she was admitted to the hospital in October she weighed in the 120s. During hospitalization and a little while after that she got her weight up to the 130s but has recurrent nausea, vomiting and diffuse but more lower, abdominal pain.  She has chronic epigastric discomfort. Ongoing weight loss. Daily, brown stools. No blood or melena. No bloody or coffee-ground emesis.  Most of the time her emesis consisted of food she just swallowed and then is clear/bilious.  Weight is now107# She's been compliant with the twice daily Protonix. She takes oxycodone 4 times daily but denies use of any ibuprofen or other NSAIDs. She's had some minor nose bleeding. She bruises easily. She says that anemia runs in her family and both her mother and maternal grandmother suffered from anemia but she is not aware that sickle cell disease or thalassemia runs in her family.  She is lactose intolerant.  Patient drinks a couple of hard lemonades and a half pint of vodka daily.   Patient brought to the ER following a witnessed syncopal spell lasting less than 2 minutes and associated with convulsions yesterday.  Her glucose at arrival was 43.    Hemoglobin 10.8 .Marland Kitchen To 9.4 this AM.  LFTs normal.  Albumin slightly low at 3.3. Lipase not assayed. Troponin I normal. Hypokalemia. Renal function normal. CT scan of abdomen and pelvis with contrast shows mildly distended colon with air-fluid levels but no signs of colitis, question adynamic ileus. Postsurgical changes from her gastrojejunostomy. She will be distention with 8 mm CBD and  3 mm PD.  Some partial fatty replacement in the pancreas.   Past Medical History:  Diagnosis Date  . Abnormal Pap smear   . Anemia   . Anxiety    Takes xanax  . Arthritis    Has rx for percocet  . Bulging lumbar disc   . Chronic pain   . Fibromyalgia   . Migraines     Past Surgical History:  Procedure Laterality Date  . CERVICAL CONE BIOPSY    . CESAREAN SECTION     X 3  . CESAREAN SECTION WITH BILATERAL TUBAL LIGATION Bilateral 10/28/2012   Procedure: Repeat cesarean section with delivery of baby boy at 1210. Apgars 8/9.  BILATERAL TUBAL LIGATION;  Surgeon: Florian Buff, MD;  Location: [facility];  Service: Obstetrics;  Laterality: Bilateral;  . GASTRIC BYPASS  2007  . GASTRIC BYPASS      Prior to Admission medications   Medication Sig Start Date End Date  Taking? Authorizing Provider  ALPRAZolam Duanne Moron) 1 MG tablet Take 1 mg by mouth 3 (three) times daily.  04/28/13  Yes Historical Provider, MD  naproxen (NAPROSYN) 375 MG tablet Take 375 mg by mouth 2 (two) times daily with a meal.   Yes Historical Provider, MD  omeprazole (PRILOSEC) 20 MG capsule Take 20 mg by mouth daily.   Yes Historical Provider, MD  ondansetron (ZOFRAN ODT) 4 MG disintegrating tablet Take 1 tablet (4 mg total) by mouth every 8 (eight) hours as needed for nausea. 02/12/16  Yes Tanna Furry, MD  oxyCODONE-acetaminophen (PERCOCET) 10-325 MG tablet Take 1 tablet by mouth every 4 (four) hours as needed for pain.   Yes Historical Provider, MD  oxymetazoline (AFRIN) 0.05 % nasal spray Place 1 spray into both nostrils 2 (two) times daily as needed for congestion. 02/05/16  Yes Patrecia Pour, MD  gabapentin (NEURONTIN) 300 MG capsule Take 1 capsule (300 mg total) by mouth 3 (three) times daily. Patient not taking: Reported on 04/22/2016 07/06/13   Charlett Blake, MD  Multiple Vitamin (MULTIVITAMIN WITH MINERALS) TABS tablet Take 1 tablet by mouth daily. Patient not taking: Reported on 04/22/2016 02/06/16   Patrecia Pour, MD  pantoprazole (PROTONIX) 40 MG tablet Take 1 tablet (40 mg total) by mouth 2 (two) times daily. Patient not taking: Reported on 04/22/2016 02/05/16   Patrecia Pour, MD    Scheduled Meds: . folic acid  1 mg Oral Daily  . LORazepam  0-4 mg Oral Q6H   Followed by  . [START ON 04/24/2016] LORazepam  0-4 mg Oral Q12H  . multivitamin with minerals  1 tablet Oral Daily  . pantoprazole  40 mg Oral Daily  . thiamine  100 mg Oral Daily   Infusions: . dextrose 5 % and 0.9% NaCl 75 mL/hr at 04/22/16 0211   PRN Meds: acetaminophen **OR** acetaminophen, LORazepam **OR** LORazepam, ondansetron **OR** ondansetron (ZOFRAN) IV, oxyCODONE-acetaminophen **AND** oxyCODONE   Allergies as of 04/21/2016  . (No Known Allergies)    Family History  Problem Relation Age of Onset    . Diabetes Father   . Depression Maternal Grandmother   . Heart disease Maternal Grandfather   . Depression Paternal Grandmother     Social History   Social History  . Marital status: Widowed    Spouse name: N/A  . Number of children: N/A  . Years of education: N/A   Occupational History  . Not on file.   Social History Main Topics  . Smoking status: Former  Smoker    Packs/day: 0.50    Years: 10.00    Types: Cigarettes    Quit date: 03/16/2012  . Smokeless tobacco: Never Used  . Alcohol use 0.0 oz/week     Comment: socially  . Drug use: No  . Sexual activity: Yes    Birth control/ protection: None     Comment: pregnant   Other Topics Concern  . Not on file   Social History Narrative  . No narrative on file    REVIEW OF SYSTEMS: Constitutional:  Feels tired a lot. ENT:  No nose bleeds Pulm:  No dyspnea on exertion, no cough. CV:  No palpitations, no LE edema. No chest pain GU:  No hematuria, no frequency GI:  Per HPI Heme:  Per HPI   Transfusions:  Per HPI Neuro:  No headaches, no peripheral tingling or numbness Derm:  No itching, no rash or sores.  Endocrine:  No sweats or chills.  No polyuria or dysuria Immunization:  Not queried Travel:  None beyond local counties in last few months.    PHYSICAL EXAM: Vital signs in last 24 hours: Vitals:   04/22/16 0515 04/22/16 0607  BP: 135/95 (!) 144/92  Pulse: 73 75  Resp: 10 18  Temp:  97.9 F (36.6 C)   Wt Readings from Last 3 Encounters:  04/22/16 48.6 kg (107 lb 1.6 oz)  02/02/16 54.7 kg (120 lb 11.2 oz)  04/12/15 62.9 kg (138 lb 9 oz)    General: Cachectic appearing AAF.  Looks chronically unwell and older than stated age. Head:  No swelling. No facial asymmetry. Skeletal faces.  Eyes:  No scleral icterus. Conjunctiva pink. Ears:  Not HOH  Nose:  No discharge or congestion Mouth:  Moist, clear oral mucosa. Edentulous. Neck:  No masses, no JVD. No TMG Lungs:  Clear bilaterally. No dyspnea. No  cough. Heart: RRR. No MRG. S1, S2 present Abdomen:  Soft.  Not distended. Mild tenderness which is diffuse. No guarding or rebound. Bowel sounds active. No HSM or masses. No bruits, no hernias..   Rectal: Deferred   Musc/Skeltl: No gross joint deformities or swelling. Extremities:  No CCE.  Neurologic:  Quite somnolent. Fell asleep several times during the interview. She does awaken easily. Moves all 4 limbs. No tremor. Skin:  No rashes, sores or suspicious lesions. Tattoos:  Small tattoos on the arms Nodes:  No cervical adenopathy   Psych:  Cooperative, calm. Affect blunted.  Intake/Output from previous day: 12/27 0701 - 12/28 0700 In: 406.3 [P.O.:120; I.V.:286.3] Out: -  Intake/Output this shift: Total I/O In: 360 [P.O.:360] Out: 1700 [Urine:1700]  LAB RESULTS:  Recent Labs  04/22/16 0034 04/22/16 0042 04/22/16 0635  WBC 4.0  --  5.6  HGB 10.8* 11.6* 9.4*  HCT 32.0* 34.0* 28.1*  PLT 332  --  332   BMET Lab Results  Component Value Date   NA 135 04/22/2016   NA 135 04/22/2016   NA 134 (L) 04/22/2016   K 3.3 (L) 04/22/2016   K 3.2 (L) 04/22/2016   K 3.1 (L) 04/22/2016   CL 100 (L) 04/22/2016   CL 98 (L) 04/22/2016   CL 99 (L) 04/22/2016   CO2 27 04/22/2016   CO2 23 04/22/2016   CO2 22 02/12/2016   GLUCOSE 68 04/22/2016   GLUCOSE 73 04/22/2016   GLUCOSE 70 04/22/2016   BUN <5 (L) 04/22/2016   BUN <3 (L) 04/22/2016   BUN <5 (L) 04/22/2016   CREATININE 0.53 04/22/2016  CREATININE 0.50 04/22/2016   CREATININE 0.63 04/22/2016   CALCIUM 8.6 (L) 04/22/2016   CALCIUM 9.1 04/22/2016   CALCIUM 9.2 02/12/2016   LFT  Recent Labs  04/22/16 0034 04/22/16 0635  PROT 6.8 6.0*  ALBUMIN 3.9 3.3*  AST 18 13*  ALT 12* 11*  ALKPHOS 66 58  BILITOT 0.4 0.5   PT/INR Lab Results  Component Value Date   INR 1.06 02/12/2016   Hepatitis Panel No results for input(s): HEPBSAG, HCVAB, HEPAIGM, HEPBIGM in the last 72 hours. C-Diff No components found for:  CDIFF Lipase     Component Value Date/Time   LIPASE 40 02/02/2016 1819    Drugs of Abuse     Component Value Date/Time   LABOPIA NONE DETECTED 04/22/2016 0038   COCAINSCRNUR NONE DETECTED 04/22/2016 0038   LABBENZ NONE DETECTED 04/22/2016 0038   AMPHETMU NONE DETECTED 04/22/2016 0038   THCU NONE DETECTED 04/22/2016 0038   LABBARB NONE DETECTED 04/22/2016 0038     RADIOLOGY STUDIES: Ct Head Wo Contrast  Result Date: 04/22/2016 CLINICAL DATA:  Transient altered mental status.  Alcohol use. EXAM: CT HEAD WITHOUT CONTRAST TECHNIQUE: Contiguous axial images were obtained from the base of the skull through the vertex without intravenous contrast. COMPARISON:  None. FINDINGS: Brain: No evidence of acute infarction, hemorrhage, hydrocephalus, extra-axial collection or mass lesion/mass effect. Vascular: No hyperdense vessel or unexpected calcification. Skull: Normal. Negative for fracture or focal lesion. Sinuses/Orbits: Paranasal sinuses and mastoid air cells are clear. The visualized orbits are unremarkable. Other: None. IMPRESSION: No acute intracranial abnormality. Electronically Signed   By: Jeb Levering M.D.   On: 04/22/2016 02:49     IMPRESSION:   *  Chronic anemia.  Iron deficient.  Due to gastric bypass status she's probably not absorbing iron.  *  Several months of nausea, vomiting, anorexia and overall 40 pounds weight loss.  Rule out ulcers, neoplasia, poor gastric motility.    *  S/p gastric bypass.    *   Chronic narcotics dependent poly-focal abdominal pain.  *  Syncope with possible seizure activity. Raises question of alcohol withdrawal seizure though she had not recently quit alcohol.  But hypoglycemia, narcotics could be culprit for syncope.  She is on CIWA protocol but has not required prn Ativan.  PLAN:     *  Consult pharmacy regarding parenteral iron infusions.  Pre-albumin in the morning.  EGD tomorrow at ~9AM.     Azucena Freed  04/22/2016, 12:44  PM Pager: [phone]

## 2016-04-23 NOTE — Progress Notes (Signed)
Shift event note:  Notified by RN pt c/o sudden onset of itching and burning to palms of both hands. HR into 150-160's. Pt currently receiving IV Iron and RN concerned for possible allergic reaction though she admits there are no objective signs of allergic reaction. IV Benadryl was ordered. Pt insisted on getting up to [facility] for sudden need to have BM. Refused to use bedpan. RN calls back to say pt feels like her "lungs are swelling". Epi-pen was ordered and pt assessed immediately at bedside. At bedside pt noted sitting on BSC having BM. IV benadryl had been given and pt appears somewhat lethargic but is oriented and answers questions appropriately. BBS CTA and there is no upper airway stridor. RR-16 w/ 02 sats 100% on r/a.  SBP which was originally 90's now in the low 100's (104/86). HR has trended down and is currently in the low 100's. Telemetry reveals a ST but otherwise normal rhythm. Pt denies CP. 500 cc IVFB started.  There is no discernable rash, swelling or redness to palms of her hands. She admits symptoms she was experiencing earlier have resolved. Pt assisted back to bed.  Assessment/Plan: 1. Allergic type symptoms: Subjective. No objective s/s allergic reaction. Pt was receiving IV Nulecit when sx's occurred but pt did receive earlier today w/o incident. Benadryl given, Epi-pen was held. Etiology unclear. Pt does have h/o anxiety and is currently on CIWA/Ativan protocol for ETOH abuse.  2. Tachycardia: Etiology unclear. Likely a component of anxiety vs withdrawal. Asymptomatic. Nearly resolved w/ IVF's.  Observed pt for approx 45 min and at the time of my departure she was resting quietly in NAD. Will continue to monitor closely on telemetry and defer further changes in plan to rounding MD.   Jeryl Columbia, NP-C [facility] Pager [phone]

## 2016-04-23 NOTE — Progress Notes (Signed)
Hypoglycemic Event  CBG: 60  Treatment: 15 GM carbohydrate snack  Symptoms: None  Follow-up CBG: Time:2033 CBG Result:122  Possible Reasons for Event: Inadequate meal intake  Comments/MD notified:    Angie [name]

## 2016-04-23 NOTE — Transfer of Care (Signed)
Immediate Anesthesia Transfer of Care Note  Patient: Angie [name]  Procedure(s) Performed: Procedure(s): ESOPHAGOGASTRODUODENOSCOPY (EGD) WITH PROPOFOL (N/A)  Patient Location: Endoscopy Unit  Anesthesia Type:MAC  Level of Consciousness: awake, alert  and oriented  Airway & Oxygen Therapy: Patient Spontanous Breathing  Post-op Assessment: Report given to RN and Post -op Vital signs reviewed and stable  Post vital signs: Reviewed and stable  Last Vitals:  Vitals:   04/23/16 0846 04/23/16 0850  BP: (!) 93/59 (!) 87/56  Pulse: 88 90  Resp: 18 (!) 0  Temp: 36.6 C     Last Pain:  Vitals:   04/23/16 0846  TempSrc: Oral  PainSc:       Patients Stated Pain Goal: 0 ([id number])  Complications: No apparent anesthesia complications

## 2016-04-23 NOTE — Progress Notes (Signed)
  Echocardiogram 2D Echocardiogram has been performed.  Tresa Res 04/23/2016, 1:30 PM

## 2016-04-23 NOTE — Progress Notes (Signed)
Nutrition Follow-up  DOCUMENTATION CODES:   Severe malnutrition in context of chronic illness  INTERVENTION:   -Continue Boost Breeze po TID, each supplement provides 250 kcal and 9 grams of protein -Continue MVI daily -Provided "National Dysphagia Diet Pureed Nutrition Therapy" handout from [facility] Nutrition Care Manual  NUTRITION DIAGNOSIS:   Malnutrition related to chronic illness as evidenced by percent weight loss, severe depletion of muscle mass, severe depletion of body fat.  Ongoing  GOAL:   Patient will meet greater than or equal to 90% of their needs  Progressing  MONITOR:   PO intake, Supplement acceptance, Diet advancement, Labs, Weight trends, Skin, I & O's  REASON FOR ASSESSMENT:   Consult Assessment of nutrition requirement/status  ASSESSMENT:   Angie [name] is a 50 y.o. female with alcohol abuse, history of gastric bypass, anxiety and fibromyalgia and anemia who was admitted in October this year for hypotension and dizziness with severe anemia had received transfusion and at that time CT abdomen was unremarkable presents to the ER after patient had a syncopal episode as witnessed by patient's friend. In the ER patient was found to be hypoglycemic repeatedly and was started on D5 normal. Patient states she has been not eating well for last few weeks because of nausea.   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 Per GI MD, plan for pt to be on a pureed diet for 2 months and to follow-up with repeat endoscopy in 1 month. Plan is to hold off on surgery currently due to malnourished state.   Spoke with pt at bedside, who had multiple complaints. She had a flat affect throughout visit. She was upset to learn about duration of pureed diet ("I thought they said it would only be for a couple of days"). Explained pt rationale for pureed diet. Pt reports that she has been  consuming mainly juices at home and does not tolerate milky type supplements well. Lunch tray revealed that pt consumed only a few bites of mashed potatoes. Pt complains of throat being sore secondary to procedure. Per discussion with RN, pt has had multiple hypoglycemic episodes likely due to poor oral intake.   Pt reports she does not have a blender to puree food. Education was focused on high protein foods of thin liquid or pureed consistency that she liked, including ice cream, pudding, yogurt, mashed potatoes, and cream soups. Pt also expressed frustration of being unable to locate clear liquid supplements- RD wrote down places that pt could locate these supplements (pharmacy special order, online (ex [employer]), or [employer]). Pt could also try equivalent Ensure Clear.   Nutrition-Focused physical exam completed. Findings are severe fat depletion, severe muscle depletion, and no edema.   Labs reviewed: CBGS: 61-103.   Diet Order:  DIET - DYS 1 Room service appropriate? Yes; Fluid consistency: Thin  Skin:  Reviewed, no issues  Last BM:  04/23/16  Height:   Ht Readings from Last 1 Encounters:  04/22/16 5' (1.524 m)    Weight:   Wt Readings from Last 1 Encounters:  04/22/16 107 lb 1.6 oz (48.6 kg)    Ideal Body Weight:  45.5 kg  BMI:  Body mass index is 20.92 kg/m.  Estimated Nutritional Needs:   Kcal:  1500-1700  Protein:  80-95 grams  Fluid:  1.5-1.7 L  EDUCATION NEEDS:   Education needs addressed  Alyrica Thurow A. Jimmye Norman, RD, LDN, CDE Pager: [phone] After hours Pager: [phone]

## 2016-04-23 NOTE — Progress Notes (Signed)
Patient called at 2334 c/o of generalized itching, bumps on legs, hands burning and swelling, and shortness of breathe, heart rate in 160s. Patient had just completed IV iron. Paged oncall NP schorr,  of possible allergic reaction. Started patient on normal saline and gave 2L of O2 via nasal cannula. Orders placed for IV benadryl, and given then patient started c/o of feeling increased shortness of breath, feeling like her lungs and throat was swelling. Paged oncall NP and made aware of patient c/o,. Orders placed. Then patient started vomiting and requesting to use bathroom to have bowel movement. Placed patient on the bedside commode. Patient had a large bowel movement.  NP Schorr,  Came to the bedside and assesed patient. Patient became lethargic returned to bed. Started c/o of abdominal pain requesting more pain medication. Gave verbal orders to hold epi-pen, give 500 cc bolus, GI cocktail and recheck blood pressure manually. Patient symptoms be resolved, refused GI cocktail and rested in bed. Will continue to monitor and make day shift aware.

## 2016-04-23 NOTE — Anesthesia Preprocedure Evaluation (Signed)
Anesthesia Evaluation  Patient identified by MRN, date of birth, ID band Patient awake    Reviewed: Allergy & Precautions, NPO status   Airway Mallampati: II  TM Distance: >3 FB     Dental   Pulmonary neg pulmonary ROS, former smoker,    breath sounds clear to auscultation       Cardiovascular negative cardio ROS   Rhythm:Regular Rate:Normal     Neuro/Psych  Headaches,    GI/Hepatic Neg liver ROS, GI history noted. CG   Endo/Other  negative endocrine ROS  Renal/GU Renal disease     Musculoskeletal  (+) Arthritis , Fibromyalgia -  Abdominal   Peds  Hematology  (+) anemia ,   Anesthesia Other Findings   Reproductive/Obstetrics                             Anesthesia Physical Anesthesia Plan  ASA: III  Anesthesia Plan: MAC   Post-op Pain Management:    Induction: Intravenous  Airway Management Planned: Simple Face Mask  Additional Equipment:   Intra-op Plan:   Post-operative Plan: Extubation in OR  Informed Consent: I have reviewed the patients History and Physical, chart, labs and discussed the procedure including the risks, benefits and alternatives for the proposed anesthesia with the patient or authorized representative who has indicated his/her understanding and acceptance.   Dental advisory given  Plan Discussed with: CRNA and Anesthesiologist  Anesthesia Plan Comments:         Anesthesia Quick Evaluation

## 2016-04-24 DIAGNOSIS — E43 Unspecified severe protein-calorie malnutrition: Secondary | ICD-10-CM

## 2016-04-24 DIAGNOSIS — G43A0 Cyclical vomiting, not intractable: Secondary | ICD-10-CM

## 2016-04-24 LAB — BASIC METABOLIC PANEL
Anion gap: 8 (ref 5–15)
BUN: <5 mg/dL — ABNORMAL LOW (ref 6–20)
CO2: 23 mmol/L (ref 22–32)
Calcium: 8.6 mg/dL — ABNORMAL LOW (ref 8.9–10.3)
Chloride: 106 mmol/L (ref 101–111)
Creatinine, Ser: 0.67 mg/dL (ref 0.44–1.00)
GFR calc Af Amer: >60 mL/min (ref >60)
GFR calc non Af Amer: >60 mL/min (ref >60)
Glucose, Bld: 104 mg/dL — ABNORMAL HIGH (ref 65–99)
Potassium: 3.7 mmol/L (ref 3.5–5.1)
Sodium: 137 mmol/L (ref 135–145)

## 2016-04-24 LAB — GLUCOSE, CAPILLARY
Glucose-Capillary: 130 mg/dL — ABNORMAL HIGH (ref 65–99)
Glucose-Capillary: 65 mg/dL (ref 65–99)
Glucose-Capillary: 69 mg/dL (ref 65–99)
Glucose-Capillary: 80 mg/dL (ref 65–99)
Glucose-Capillary: 82 mg/dL (ref 65–99)
Glucose-Capillary: 84 mg/dL (ref 65–99)
Glucose-Capillary: 85 mg/dL (ref 65–99)
Glucose-Capillary: 87 mg/dL (ref 65–99)
Glucose-Capillary: 97 mg/dL (ref 65–99)
Glucose-Capillary: 98 mg/dL (ref 65–99)
Glucose-Capillary: 99 mg/dL (ref 65–99)

## 2016-04-24 LAB — CBC
HCT: 31.7 % — ABNORMAL LOW (ref 36.0–46.0)
Hemoglobin: 10.2 g/dL — ABNORMAL LOW (ref 12.0–15.0)
MCH: 31.6 pg (ref 26.0–34.0)
MCHC: 32.2 g/dL (ref 30.0–36.0)
MCV: 98.1 fL (ref 78.0–100.0)
Platelets: 295 10*3/uL (ref 150–400)
RBC: 3.23 MIL/uL — ABNORMAL LOW (ref 3.87–5.11)
RDW: 15.2 % (ref 11.5–15.5)
WBC: 6 10*3/uL (ref 4.0–10.5)

## 2016-04-24 LAB — TSH: TSH: 0.805 u[IU]/mL (ref 0.350–4.500)

## 2016-04-24 LAB — CORTISOL: Cortisol, Plasma: 9 ug/dL

## 2016-04-24 MED ORDER — OXYCODONE HCL 5 MG PO TABS
5.0000 mg | ORAL_TABLET | Freq: Four times a day (QID) | ORAL | Status: DC | PRN
  Administered 2016-04-24 – 2016-04-25 (×4): 5 mg via ORAL
  Filled 2016-04-24 (×4): qty 1

## 2016-04-24 MED ORDER — KETOROLAC TROMETHAMINE 30 MG/ML IJ SOLN
30.0000 mg | Freq: Once | INTRAMUSCULAR | Status: AC
  Administered 2016-04-24: 30 mg via INTRAVENOUS
  Filled 2016-04-24: qty 1

## 2016-04-24 MED ORDER — ALPRAZOLAM 0.5 MG PO TABS
1.0000 mg | ORAL_TABLET | Freq: Once | ORAL | Status: AC
  Administered 2016-04-24: 1 mg via ORAL
  Filled 2016-04-24: qty 2

## 2016-04-24 MED ORDER — OXYCODONE-ACETAMINOPHEN 10-325 MG PO TABS: 1.0000 | ORAL_TABLET | Freq: Four times a day (QID) | ORAL | Status: DC | PRN

## 2016-04-24 MED ORDER — OXYCODONE-ACETAMINOPHEN 5-325 MG PO TABS
1.0000 | ORAL_TABLET | Freq: Four times a day (QID) | ORAL | Status: DC | PRN
  Administered 2016-04-24 – 2016-04-25 (×4): 1 via ORAL
  Filled 2016-04-24 (×4): qty 1

## 2016-04-24 MED ORDER — MORPHINE SULFATE (PF) 2 MG/ML IV SOLN
1.0000 mg | INTRAVENOUS | Status: DC | PRN
  Administered 2016-04-24: 1 mg via INTRAVENOUS
  Filled 2016-04-24: qty 1

## 2016-04-24 NOTE — Progress Notes (Signed)
     [facility] Progress Note  Chief Complaint:    Vomting, anemia  Subjective: in midst of eating pureed breakfast. Some upper abdominal discomfort, no nausea  Objective:  Vital signs in last 24 hours: Temp:  [98.4 F (36.9 C)-98.6 F (37 C)] 98.5 F (36.9 C) (12/30 0509) Pulse Rate:  [71-87] 71 (12/30 0636) Resp:  [14-20] 20 (12/30 0509) BP: (91-136)/(58-96) 136/88 (12/30 0636) SpO2:  [100 %] 100 % (12/30 0509) FiO2 (%):  [28 %] 28 % (12/29 1012) Last BM Date: 04/23/16 General:   Alert, emaciated black female in NAD EENT:  Normal hearing, non icteric sclera, conjunctive pink.  Heart:  Regular rate and rhythm; no murmurs, no lower extremity edema Pulm: Normal respiratory effort, lungs CTA bilaterally without wheezes or crackles. Abdomen:  Soft, nondistended, mild epigastric tenderness.  Normal bowel sounds, no masses felt.     Neurologic:  Alert and  oriented x4;  grossly normal neurologically. Psych:  Alert and cooperative. Normal mood and affect. Skin:   Intake/Output from previous day: 12/29 0701 - 12/30 0700 In: 2097.5 [P.O.:500; I.V.:1597.5] Out: 300 [Urine:300] Intake/Output this shift: No intake/output data recorded.  Lab Results:  Recent Labs  04/22/16 0635 04/23/16 0706 04/24/16 0705  WBC 5.6 10.3 6.0  HGB 9.4* 12.3 10.2*  HCT 28.1* 36.8 31.7*  PLT 332 346 295   BMET  Recent Labs  04/22/16 0034 04/22/16 0042 04/22/16 0635 04/24/16 0705  NA 134* 135 135 137  K 3.1* 3.2* 3.3* 3.7  CL 99* 98* 100* 106  CO2 23  --  27 23  GLUCOSE 70 73 68 104*  BUN <5* <3* <5* <5*  CREATININE 0.63 0.50 0.53 0.67  CALCIUM 9.1  --  8.6* 8.6*   LFT  Recent Labs  04/22/16 0635  PROT 6.0*  ALBUMIN 3.3*  AST 13*  ALT 11*  ALKPHOS 58  BILITOT 0.5     Assessment / Plan:   50 yo female with h/o gastric bypass presenting with vomiting, malnutrition and and iron deficiency. 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  -continue pureed diet for 2 months -no nsaids -repeat EGD, with possible repeat dilation in one month. Our office will contact patient with date and time of procedure.     LOS: 2 days   Tye Savoy NP 04/24/2016, 9:01 AM  Pager number [phone]  I have discussed the case with the PA, and that is the plan I formulated. I personally interviewed and examined the patient. Ms Fredman is mostly interested in talking about her chronic pain issues, which I again deferred to Medicine.  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  Plan is as outlined above.  We will contact her next week.  Will not plan to see again this hospital stay unless called.  Nelida Meuse III Pager [phone]  Mon-Fri 8a-5p [phone] after 5p, weekends, holidays

## 2016-04-24 NOTE — Progress Notes (Addendum)
Hypoglycemic Event  CBG: 65  Treatment: 8 oz Orange Juice  Symptoms: None  Follow-up CBG: Time:  11:05   CBG Result: 97  Possible Reasons for Event: Inadequate meal intake  Comments/MD notified:

## 2016-04-24 NOTE — Progress Notes (Signed)
PROGRESS NOTE                                                                                                                                                                                                             Patient Demographics:    Angie [name], is a 50 y.o. female, DOB - 05-07-1965, ZH:[id number]  Admit date - 04/22/2016   Admitting Physician Rise Patience, MD  Outpatient Primary MD for the patient is Ricke Hey, MD  LOS - 2  Outpatient Specialists:None  Chief Complaint  Patient presents with  . Seizures       Brief Narrative   50 year old female with alcohol abuse, history of gastric bypass, anxiety, fibromyalgia, iron deficiency anemia, chronic narcotic use due to bursitis and fibromyalgia brought to the ED after a witnessed syncope by a friend. In the ED she was found to be hypoglycemic repeatedly and placed on IV dextrose. Patient reports drinking vodka along with hard lemonade every day. It was unclear if she had a seizure episode. No bowel/urinary incontinence or tongue bite. CT head was unremarkable. In the ED she received D50 followed by D5 with normal saline. Placed on observation. Patient informs that she has difficulty keeping both solid and liquid down on several occasions for past 4-5 months and has been losing weight. Also reports taking Naprosyn regularly for her fibromyalgia. She was hospitalized in October for hypotension and dizziness and was transfused for low hemoglobin.   Subjective:   Continues to have low cbg , in the 60s, despite on D10. Still complaining of epigastric pain.   Assessment  & Plan :    Principal Problem:   Syncope Summary secondary to hypoglycemia. EEG negative for seizures. Head CT unremarkable. 2-D echo with normal EF and no wall motion abnormality.     Active Problems: Persistent Hypoglycemia Low normal levels despite continuous D10. Check  sulfonylurea level cortisol of 9. Check TSH.   Nausea and vomiting with history of gastric bypass/ severe malnutrition/cachexia - reports taking NSAIDs regularly for fibromyalgia. Also has ongoing alcohol use. -GI consult appreciated. 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 -GI recommend pured diet for 2 months and avoiding aspirin and any other NSAIDs. Recommend upper endoscopy in 1 month to evaluate response to therapy and need for  further dilatation.   Severe iron deficiency anemia Possibly due to poor by mouth intake and malabsorption from bypass surgery. Given IV iron (nulecit) but had severe allergy reaction after completion. Low B12 (248). When B12 injection. (She will need monthly B12 injections upon discharge) Hemoglobin has improved and stable.   Alcohol abuse No signs of withdrawal. Continue thiamine, folate and multivitamin. Counseled on cessation.  Hypokalemia Replenished   Chronic pain Switch back to her home oxycodone regimen. Will not escalate pain medicine further.  Code Status : Full code  Family Communication  : None at bedside  Disposition Plan  : Home once hypoglycemia improved.  Barriers For Discharge : Hypoglycemia  Consults  :  [facility]  Procedures  : None  DVT Prophylaxis  :    Lab Results  Component Value Date   PLT 295 04/24/2016    Antibiotics  :  Anti-infectives    None        Objective:   Vitals:   04/23/16 2044 04/24/16 0509 04/24/16 0636 04/24/16 1230  BP: 128/74 (!) 91/58 136/88 103/66  Pulse: 84 77 71 94  Resp: 19 20    Temp: 98.4 F (36.9 C) 98.5 F (36.9 C)  99.1 F (37.3 C)  TempSrc:    Oral  SpO2: 100% 100%  100%  Weight:      Height:        Wt Readings from Last 3 Encounters:  04/22/16 48.6 kg (107 lb 1.6 oz)  02/02/16 54.7 kg (120 lb 11.2 oz)  04/12/15 62.9 kg (138 lb 9 oz)     Intake/Output Summary (Last 24 hours) at  04/24/16 1458 Last data filed at 04/24/16 0543  Gross per 24 hour  Intake           1747.5 ml  Output              300 ml  Net           1447.5 ml     Physical Exam  [name] female not in distress HEENT: Pallor present, dry mucosa, supple neck, Chest: clear b/l, no added sounds CVS: N S1&S2, no murmurs, rubs or gallop GI: soft, mild epigastric tenderness, ND, BS+ Musculoskeletal: warm, no edema     Data Review:    CBC  Recent Labs Lab 04/22/16 0034 04/22/16 0042 04/22/16 0635 04/23/16 0706 04/24/16 0705  WBC 4.0  --  5.6 10.3 6.0  HGB 10.8* 11.6* 9.4* 12.3 10.2*  HCT 32.0* 34.0* 28.1* 36.8 31.7*  PLT 332  --  332 346 295  MCV 95.0  --  92.7 95.1 98.1  MCH 32.0  --  31.0 31.8 31.6  MCHC 33.8  --  33.5 33.4 32.2  RDW 15.4  --  15.3 14.8 15.2  LYMPHSABS 1.4  --  1.1  --   --   MONOABS 0.3  --  0.5  --   --   EOSABS 0.1  --  0.0  --   --   BASOSABS 0.0  --  0.0  --   --     Chemistries   Recent Labs Lab 04/22/16 0034 04/22/16 0042 04/22/16 0635 04/24/16 0705  NA 134* 135 135 137  K 3.1* 3.2* 3.3* 3.7  CL 99* 98* 100* 106  CO2 23  --  27 23  GLUCOSE 70 73 68 104*  BUN <5* <3* <5* <5*  CREATININE 0.63 0.50 0.53 0.67  CALCIUM 9.1  --  8.6* 8.6*  AST 18  --  13*  --   ALT 12*  --  11*  --   ALKPHOS 66  --  58  --   BILITOT 0.4  --  0.5  --    ------------------------------------------------------------------------------------------------------------------ No results for input(s): CHOL, HDL, LDLCALC, TRIG, CHOLHDL, LDLDIRECT in the last 72 hours.  Lab Results  Component Value Date   HGBA1C 4.9 02/02/2016   ------------------------------------------------------------------------------------------------------------------ No results for input(s): TSH, T4TOTAL, T3FREE, THYROIDAB in the last 72 hours.  Invalid input(s):  FREET3 ------------------------------------------------------------------------------------------------------------------  Recent Labs  04/22/16 1819 04/23/16 0706  VITAMINB12 325 248  FOLATE 33.2  --   FERRITIN 132  --   TIBC 286  --   IRON 50  --   RETICCTPCT 0.8  --     Coagulation profile No results for input(s): INR, PROTIME in the last 168 hours.  No results for input(s): DDIMER in the last 72 hours.  Cardiac Enzymes  Recent Labs Lab 04/22/16 0635  TROPONINI <0.03   ------------------------------------------------------------------------------------------------------------------ No results found for: BNP  Inpatient Medications  Scheduled Meds: . feeding supplement  1 Container Oral TID BM  . ferric gluconate (FERRLECIT/NULECIT) IV  250 mg Intravenous [id number] Hr x 2  . folic acid  1 mg Oral Daily  . gi cocktail  30 mL Oral Once  . LORazepam  0-4 mg Oral Q12H  . multivitamin with minerals  1 tablet Oral Daily  . oxymetazoline  1 spray Each Nare BID  . pantoprazole  40 mg Oral Daily  . thiamine  100 mg Oral Daily   Continuous Infusions: . dextrose 75 mL/hr at 04/24/16 1121   PRN Meds:.acetaminophen **OR** acetaminophen, LORazepam **OR** LORazepam, ondansetron **OR** ondansetron (ZOFRAN) IV, oxyCODONE-acetaminophen **AND** oxyCODONE  Micro Results No results found for this or any previous visit (from the past 240 hour(s)).  Radiology Reports Ct Head Wo Contrast  Result Date: 04/22/2016 CLINICAL DATA:  Transient altered mental status.  Alcohol use. EXAM: CT HEAD WITHOUT CONTRAST TECHNIQUE: Contiguous axial images were obtained from the base of the skull through the vertex without intravenous contrast. COMPARISON:  None. FINDINGS: Brain: No evidence of acute infarction, hemorrhage, hydrocephalus, extra-axial collection or mass lesion/mass effect. Vascular: No hyperdense vessel or unexpected calcification. Skull: Normal. Negative for fracture or focal lesion.  Sinuses/Orbits: Paranasal sinuses and mastoid air cells are clear. The visualized orbits are unremarkable. Other: None. IMPRESSION: No acute intracranial abnormality. Electronically Signed   By: Jeb Levering M.D.   On: 04/22/2016 02:49   Dg Abd Acute W/chest  Result Date: 04/22/2016 CLINICAL DATA:  Nausea.  Abdominal pain. EXAM: DG ABDOMEN ACUTE W/ 1V CHEST COMPARISON:  07/20/2014 chest radiograph. 02/04/2016 CT abdomen/pelvis. FINDINGS: Stable cardiomediastinal silhouette with normal heart size. No pneumothorax. No pleural effusion. Lungs appear clear, with no acute consolidative airspace disease and no pulmonary edema. No dilated small bowel loops or air-fluid levels. Moderate colonic stool volume. No evidence of pneumatosis or pneumoperitoneum. Surgical sutures from gastrojejunostomy and enteroenterostomy in the left abdomen are again noted. Bilateral iliac atherosclerosis. Stable calcified venous phleboliths in the left deep pelvis. Otherwise no pathologic soft tissue calcifications. IMPRESSION: 1. No active disease in the chest. 2. Nonobstructive bowel gas pattern. 3. Moderate colonic stool volume, which may indicate constipation. Electronically Signed   By: Ilona Sorrel M.D.   On: 04/22/2016 15:59    Time Spent in minutes  25   Louellen Molder M.D on 04/24/2016 at 2:58 PM  Between 7am to 7pm - Pager - [phone]  After 7pm go to [email or web address] -  password [facility]  [facility] -  Office  [phone]

## 2016-04-25 ENCOUNTER — Encounter (HOSPITAL_COMMUNITY): Payer: Self-pay

## 2016-04-25 LAB — GLUCOSE, CAPILLARY
Glucose-Capillary: 107 mg/dL — ABNORMAL HIGH (ref 65–99)
Glucose-Capillary: 114 mg/dL — ABNORMAL HIGH (ref 65–99)
Glucose-Capillary: 117 mg/dL — ABNORMAL HIGH (ref 65–99)
Glucose-Capillary: 336 mg/dL — ABNORMAL HIGH (ref 65–99)
Glucose-Capillary: 369 mg/dL — ABNORMAL HIGH (ref 65–99)
Glucose-Capillary: 58 mg/dL — ABNORMAL LOW (ref 65–99)
Glucose-Capillary: 68 mg/dL (ref 65–99)
Glucose-Capillary: 80 mg/dL (ref 65–99)
Glucose-Capillary: 90 mg/dL (ref 65–99)
Glucose-Capillary: 91 mg/dL (ref 65–99)
Glucose-Capillary: 94 mg/dL (ref 65–99)
Glucose-Capillary: 96 mg/dL (ref 65–99)

## 2016-04-25 LAB — BETA-HYDROXYBUTYRIC ACID: Beta-Hydroxybutyric Acid: 0.09 mmol/L (ref 0.05–0.27)

## 2016-04-25 MED ORDER — OXYCODONE-ACETAMINOPHEN 5-325 MG PO TABS
1.0000 | ORAL_TABLET | ORAL | Status: DC | PRN
  Administered 2016-04-25 – 2016-04-27 (×14): 1 via ORAL
  Filled 2016-04-25 (×14): qty 1

## 2016-04-25 MED ORDER — OCTREOTIDE ACETATE 50 MCG/ML IJ SOLN
50.0000 ug | Freq: Once | INTRAMUSCULAR | Status: AC
  Administered 2016-04-25: 50 ug via SUBCUTANEOUS
  Filled 2016-04-25: qty 1

## 2016-04-25 MED ORDER — OXYCODONE HCL 5 MG PO TABS
5.0000 mg | ORAL_TABLET | ORAL | Status: DC | PRN
  Administered 2016-04-25 – 2016-04-27 (×14): 5 mg via ORAL
  Filled 2016-04-25 (×14): qty 1

## 2016-04-25 MED ORDER — ZOLPIDEM TARTRATE 5 MG PO TABS
5.0000 mg | ORAL_TABLET | Freq: Every evening | ORAL | Status: DC | PRN
  Administered 2016-04-25 – 2016-04-27 (×2): 5 mg via ORAL
  Filled 2016-04-25 (×2): qty 1

## 2016-04-25 MED ORDER — WHITE PETROLATUM GEL
Status: AC
  Administered 2016-04-25: 09:00:00
  Filled 2016-04-25: qty 1

## 2016-04-25 NOTE — Progress Notes (Signed)
CBG taken at 1831 and was 369. Had tech repeat to confirm. Second check taken at 1833 was 336. Pt states she had about 10 packs of sugar in her coffee. Paused 10% dextrose until next CBG reading.

## 2016-04-25 NOTE — Progress Notes (Signed)
@  [address] [facility] [place] paged regarding pt's request for sleep aid (specifically Ambien). [facility] returned and verbal order for 5mg  Ambien received. Will administer and continue to monitor.

## 2016-04-25 NOTE — Progress Notes (Signed)
PROGRESS NOTE                                                                                                                                                                                                             Patient Demographics:    Angie [name], is a 50 y.o. female, DOB - Nov 22, 1965, ZH:[id number]  Admit date - 04/22/2016   Admitting Physician Rise Patience, MD  Outpatient Primary MD for the patient is Angie Hey, MD  LOS - 3  Outpatient Specialists:None  Chief Complaint  Patient presents with  . Seizures       Brief Narrative   50 year old female with alcohol abuse, history of gastric bypass, anxiety, fibromyalgia, iron deficiency anemia, chronic narcotic use due to bursitis and fibromyalgia brought to the ED after a witnessed syncope by a friend. In the ED she was found to be hypoglycemic repeatedly and placed on IV dextrose. Patient reports drinking vodka along with hard lemonade every day. It was unclear if she had a seizure episode. No bowel/urinary incontinence or tongue bite. CT head was unremarkable. In the ED she received D50 followed by D5 with normal saline. Placed on observation. Patient informs that she has difficulty keeping both solid and liquid down on several occasions for past 4-5 months and has been losing weight. Also reports taking Naprosyn regularly for her fibromyalgia. She was hospitalized in October for hypotension and dizziness and was transfused for low hemoglobin.   Subjective:   Continues to have low cbg ,despite on D10. Still complaining of epigastric pain.   Assessment  & Plan :    Principal Problem:  Persistent Hypoglycemia Low normal levels despite continuous D10.  cortisol of 9. TSH normal. Will evaluate to rule out insulinoma. B-hydroxybutyrate  normal. Check proinsulin, C-peptide and A1c. Sulfonylurea panel sent Will order a dose of IV octreotide.   Syncope suspect secondary to hypoglycemia. EEG negative for seizures. Head CT unremarkable. 2-D echo with normal EF and no wall motion abnormality.     Active Problems:    Nausea and vomiting with history of gastric bypass/ severe malnutrition/cachexia - reports taking NSAIDs regularly for fibromyalgia. Also has ongoing alcohol use. -GI consult appreciated. 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 -GI recommend pured diet for 2 months and avoiding  aspirin and any other NSAIDs. Recommend upper endoscopy in 1 month to evaluate response to therapy and need for further dilatation.   Severe iron deficiency anemia Possibly due to poor by mouth intake and malabsorption from bypass surgery. Given IV iron (nulecit) but had severe allergy reaction after completion. Low B12 (248). Given B12 injection. (She will need monthly B12 injections upon discharge) Hemoglobin improved and stable.   Alcohol abuse No signs of withdrawal. Continue thiamine, folate and multivitamin. Counseled on cessation.  Hypokalemia Replenished   Chronic pain Continue her home oxycodone regimen.   Code Status : Full code  Family Communication  : None at bedside  Disposition Plan  : Home once hypoglycemia improved.  Barriers For Discharge : Hypoglycemia  Consults  :  [facility]  Procedures  : None  DVT Prophylaxis  :    Lab Results  Component Value Date   PLT 295 04/24/2016    Antibiotics  :  Anti-infectives    None        Objective:   Vitals:   04/24/16 0636 04/24/16 1230 04/24/16 2042 04/25/16 0543  BP: 136/88 103/66 135/83 114/71  Pulse: 71 94 (!) 104 100  Resp:   20 19  Temp:  99.1 F (37.3 C) 98.4 F (36.9 C) 97.6 F (36.4 C)  TempSrc:  Oral  Oral  SpO2:  100% 98% 99%  Weight:      Height:        Wt Readings from Last 3 Encounters:  04/22/16 48.6 kg (107 lb 1.6 oz)  02/02/16 54.7 kg (120 lb  11.2 oz)  04/12/15 62.9 kg (138 lb 9 oz)     Intake/Output Summary (Last 24 hours) at 04/25/16 1204 Last data filed at 04/25/16 1037  Gross per 24 hour  Intake          2463.75 ml  Output              100 ml  Net          2363.75 ml     Physical Exam  [name] female not in distress HEENT: Pallor present, dry mucosa, supple neck, Chest: clear b/l, no added sounds CVS: N S1&S2, no murmurs, rubs or gallop GI: soft, mild epigastric tenderness, ND, BS+ Musculoskeletal: warm, no edema     Data Review:    CBC  Recent Labs Lab 04/22/16 0034 04/22/16 0042 04/22/16 0635 04/23/16 0706 04/24/16 0705  WBC 4.0  --  5.6 10.3 6.0  HGB 10.8* 11.6* 9.4* 12.3 10.2*  HCT 32.0* 34.0* 28.1* 36.8 31.7*  PLT 332  --  332 346 295  MCV 95.0  --  92.7 95.1 98.1  MCH 32.0  --  31.0 31.8 31.6  MCHC 33.8  --  33.5 33.4 32.2  RDW 15.4  --  15.3 14.8 15.2  LYMPHSABS 1.4  --  1.1  --   --   MONOABS 0.3  --  0.5  --   --   EOSABS 0.1  --  0.0  --   --   BASOSABS 0.0  --  0.0  --   --     Chemistries   Recent Labs Lab 04/22/16 0034 04/22/16 0042 04/22/16 0635 04/24/16 0705  NA 134* 135 135 137  K 3.1* 3.2* 3.3* 3.7  CL 99* 98* 100* 106  CO2 23  --  27 23  GLUCOSE 70 73 68 104*  BUN <5* <3* <5* <5*  CREATININE 0.63 0.50 0.53 0.67  CALCIUM 9.1  --  8.6* 8.6*  AST 18  --  13*  --   ALT 12*  --  11*  --   ALKPHOS 66  --  58  --   BILITOT 0.4  --  0.5  --    ------------------------------------------------------------------------------------------------------------------ No results for input(s): CHOL, HDL, LDLCALC, TRIG, CHOLHDL, LDLDIRECT in the last 72 hours.  Lab Results  Component Value Date   HGBA1C 4.9 02/02/2016   ------------------------------------------------------------------------------------------------------------------  Recent Labs  04/24/16 0825  TSH 0.805    ------------------------------------------------------------------------------------------------------------------  Recent Labs  04/22/16 1819 04/23/16 0706  VITAMINB12 325 248  FOLATE 33.2  --   FERRITIN 132  --   TIBC 286  --   IRON 50  --   RETICCTPCT 0.8  --     Coagulation profile No results for input(s): INR, PROTIME in the last 168 hours.  No results for input(s): DDIMER in the last 72 hours.  Cardiac Enzymes  Recent Labs Lab 04/22/16 0635  TROPONINI <0.03   ------------------------------------------------------------------------------------------------------------------ No results found for: BNP  Inpatient Medications  Scheduled Meds: . feeding supplement  1 Container Oral TID BM  . folic acid  1 mg Oral Daily  . gi cocktail  30 mL Oral Once  . LORazepam  0-4 mg Oral Q12H  . multivitamin with minerals  1 tablet Oral Daily  . oxymetazoline  1 spray Each Nare BID  . pantoprazole  40 mg Oral Daily  . thiamine  100 mg Oral Daily   Continuous Infusions: . dextrose 75 mL/hr at 04/25/16 0023   PRN Meds:.acetaminophen **OR** acetaminophen, ondansetron **OR** ondansetron (ZOFRAN) IV, oxyCODONE-acetaminophen **AND** oxyCODONE  Micro Results No results found for this or any previous visit (from the past 240 hour(s)).  Radiology Reports Ct Head Wo Contrast  Result Date: 04/22/2016 CLINICAL DATA:  Transient altered mental status.  Alcohol use. EXAM: CT HEAD WITHOUT CONTRAST TECHNIQUE: Contiguous axial images were obtained from the base of the skull through the vertex without intravenous contrast. COMPARISON:  None. FINDINGS: Brain: No evidence of acute infarction, hemorrhage, hydrocephalus, extra-axial collection or mass lesion/mass effect. Vascular: No hyperdense vessel or unexpected calcification. Skull: Normal. Negative for fracture or focal lesion. Sinuses/Orbits: Paranasal sinuses and mastoid air cells are clear. The visualized orbits are unremarkable. Other:  None. IMPRESSION: No acute intracranial abnormality. Electronically Signed   By: Jeb Levering M.D.   On: 04/22/2016 02:49   Dg Abd Acute W/chest  Result Date: 04/22/2016 CLINICAL DATA:  Nausea.  Abdominal pain. EXAM: DG ABDOMEN ACUTE W/ 1V CHEST COMPARISON:  07/20/2014 chest radiograph. 02/04/2016 CT abdomen/pelvis. FINDINGS: Stable cardiomediastinal silhouette with normal heart size. No pneumothorax. No pleural effusion. Lungs appear clear, with no acute consolidative airspace disease and no pulmonary edema. No dilated small bowel loops or air-fluid levels. Moderate colonic stool volume. No evidence of pneumatosis or pneumoperitoneum. Surgical sutures from gastrojejunostomy and enteroenterostomy in the left abdomen are again noted. Bilateral iliac atherosclerosis. Stable calcified venous phleboliths in the left deep pelvis. Otherwise no pathologic soft tissue calcifications. IMPRESSION: 1. No active disease in the chest. 2. Nonobstructive bowel gas pattern. 3. Moderate colonic stool volume, which may indicate constipation. Electronically Signed   By: Ilona Sorrel M.D.   On: 04/22/2016 15:59    Time Spent in minutes  25   Louellen Molder M.D on 04/25/2016 at 12:04 PM  Between 7am to 7pm - Pager - [phone]  After 7pm go to [email or web address] - password [facility]  [facility] -  Office  [phone]

## 2016-04-26 ENCOUNTER — Encounter (HOSPITAL_COMMUNITY): Payer: Self-pay | Admitting: General Practice

## 2016-04-26 LAB — C-PEPTIDE: C-Peptide: 2 ng/mL (ref 1.1–4.4)

## 2016-04-26 LAB — GLUCOSE, CAPILLARY
Glucose-Capillary: 106 mg/dL — ABNORMAL HIGH (ref 65–99)
Glucose-Capillary: 108 mg/dL — ABNORMAL HIGH (ref 65–99)
Glucose-Capillary: 110 mg/dL — ABNORMAL HIGH (ref 65–99)
Glucose-Capillary: 110 mg/dL — ABNORMAL HIGH (ref 65–99)
Glucose-Capillary: 119 mg/dL — ABNORMAL HIGH (ref 65–99)
Glucose-Capillary: 125 mg/dL — ABNORMAL HIGH (ref 65–99)
Glucose-Capillary: 55 mg/dL — ABNORMAL LOW (ref 65–99)
Glucose-Capillary: 73 mg/dL (ref 65–99)
Glucose-Capillary: 76 mg/dL (ref 65–99)
Glucose-Capillary: 91 mg/dL (ref 65–99)
Glucose-Capillary: 91 mg/dL (ref 65–99)
Glucose-Capillary: 93 mg/dL (ref 65–99)

## 2016-04-26 MED ORDER — ALPRAZOLAM 0.5 MG PO TABS: 1.0000 mg | ORAL_TABLET | Freq: Three times a day (TID) | ORAL | Status: DC

## 2016-04-26 MED ORDER — KETOROLAC TROMETHAMINE 30 MG/ML IJ SOLN
30.0000 mg | Freq: Once | INTRAMUSCULAR | Status: AC
  Administered 2016-04-26: 30 mg via INTRAVENOUS
  Filled 2016-04-26: qty 1

## 2016-04-26 MED ORDER — ALPRAZOLAM 0.5 MG PO TABS
1.0000 mg | ORAL_TABLET | Freq: Three times a day (TID) | ORAL | Status: DC
  Administered 2016-04-26 – 2016-04-27 (×6): 1 mg via ORAL
  Filled 2016-04-26 (×6): qty 2

## 2016-04-26 NOTE — Progress Notes (Signed)
@  [address] [facility] [place] paged regarding pt's Ativan order expiration and concern for Benzo withdrawal. [employer] returned and home Xanax order restarted. Will administer at ordered time and continue to monitor.

## 2016-04-26 NOTE — Progress Notes (Signed)
PROGRESS NOTE                                                                                                                                                                                                             Patient Demographics:    Angie [name], is a 51 y.o. female, DOB - 1965-08-28, [id number]  Admit date - 04/22/2016   Admitting Physician Rise Patience, MD  Outpatient Primary MD for the patient is Ricke Hey, MD  LOS - 4  Outpatient Specialists:None  Chief Complaint  Patient presents with  . Seizures       Brief Narrative   51 year old female with alcohol abuse, history of gastric bypass, anxiety, fibromyalgia, iron deficiency anemia, chronic narcotic use due to bursitis and fibromyalgia brought to the ED after a witnessed syncope by a friend. In the ED she was found to be hypoglycemic repeatedly and placed on IV dextrose. Patient reports drinking vodka along with hard lemonade every day. It was unclear if she had a seizure episode. No bowel/urinary incontinence or tongue bite. CT head was unremarkable. In the ED she received D50 followed by D5 with normal saline. Placed on observation. Patient informs that she has difficulty keeping both solid and liquid down on several occasions for past 4-5 months and has been losing weight. Also reports taking Naprosyn regularly for her fibromyalgia. She was hospitalized in October for hypotension and dizziness and was transfused for low hemoglobin.   Subjective:   CBG was elevated yesterday. Apparently patient was taking high-dose sugar in her coffee. CBG has been stable off D10 drip. She complains of pain all over.   Assessment  & Plan :    Principal Problem:  Persistent Hypoglycemia Low normal levels despite continuous D10.  cortisol of 9. TSH normal. Will evaluate to rule out insulinoma. B-hydroxybutyrate  normal. Pending proinsulin, C-peptide and  A1c. Sulfonylurea panel sent. Given her dose of octreotide yesterday. CBG has remained stable off D10 drip.    Syncope suspect secondary to hypoglycemia. EEG negative for seizures. Head CT unremarkable. 2-D echo with normal EF and no wall motion abnormality.     Active Problems:    Nausea and vomiting with history of gastric bypass/ severe malnutrition/cachexia - reports taking NSAIDs regularly for fibromyalgia. Also has ongoing alcohol use. -GI consult appreciated. 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 -GI recommend pured diet for 2 months and avoiding aspirin and any other NSAIDs. Recommend upper endoscopy in 1 month to evaluate response to therapy and need for further dilatation.   Severe iron deficiency anemia Possibly due to poor by mouth intake and malabsorption from bypass surgery. Given IV iron (nulecit) but had severe allergy reaction after completion. Low B12 (248). Given B12 injection. (She will need monthly B12 injections upon discharge) Hemoglobin improved and stable.   Alcohol abuse No signs of withdrawal. Continue thiamine, folate and multivitamin. Counseled on cessation.  Hypokalemia Replenished   Chronic pain Continue her home oxycodone regimen.   Code Status : Full code  Family Communication  : None at bedside  Disposition Plan  : Home if CBG stable for the next 24 hours.  Barriers For Discharge : Hypoglycemia  Consults  :  [facility]  Procedures  : None  DVT Prophylaxis  :    Lab Results  Component Value Date   PLT 295 04/24/2016    Antibiotics  :  Anti-infectives    None        Objective:   Vitals:   04/25/16 0543 04/25/16 1610 04/25/16 2128 04/26/16 0534  BP: 114/71 135/82 123/89 124/78  Pulse: 100 78 (!) 108 89  Resp: 19  20 16   Temp: 97.6 F (36.4 C) 98.1 F (36.7 C) 97.8 F (36.6 C) 97.4 F (36.3 C)  TempSrc: Oral Oral Oral Oral  SpO2:  99% 100% 98%   Weight:      Height:        Wt Readings from Last 3 Encounters:  04/22/16 48.6 kg (107 lb 1.6 oz)  02/02/16 54.7 kg (120 lb 11.2 oz)  04/12/15 62.9 kg (138 lb 9 oz)     Intake/Output Summary (Last 24 hours) at 04/26/16 1120 Last data filed at 04/26/16 0737  Gross per 24 hour  Intake          2523.75 ml  Output             2400 ml  Net           123.75 ml     Physical Exam  [id number] female not in distress HEENT: Moist mucosa supple neck, Chest: clear b/l, no added sounds CVS: N S1&S2, no murmurs, rubs or gallop GI: soft, mild epigastric tenderness, ND, BS+ Musculoskeletal: warm, no edema     Data Review:    CBC  Recent Labs Lab 04/22/16 0034 04/22/16 0042 04/22/16 0635 04/23/16 0706 04/24/16 0705  WBC 4.0  --  5.6 10.3 6.0  HGB 10.8* 11.6* 9.4* 12.3 10.2*  HCT 32.0* 34.0* 28.1* 36.8 31.7*  PLT 332  --  332 346 295  MCV 95.0  --  92.7 95.1 98.1  MCH 32.0  --  31.0 31.8 31.6  MCHC 33.8  --  33.5 33.4 32.2  RDW 15.4  --  15.3 14.8 15.2  LYMPHSABS 1.4  --  1.1  --   --   MONOABS 0.3  --  0.5  --   --   EOSABS 0.1  --  0.0  --   --   BASOSABS 0.0  --  0.0  --   --     Chemistries   Recent Labs Lab 04/22/16 0034 04/22/16 0042 04/22/16 0635 04/24/16 0705  NA 134* 135 135 137  K 3.1* 3.2* 3.3* 3.7  CL 99* 98* 100* 106  CO2  23  --  27 23  GLUCOSE 70 73 68 104*  BUN <5* <3* <5* <5*  CREATININE 0.63 0.50 0.53 0.67  CALCIUM 9.1  --  8.6* 8.6*  AST 18  --  13*  --   ALT 12*  --  11*  --   ALKPHOS 66  --  58  --   BILITOT 0.4  --  0.5  --    ------------------------------------------------------------------------------------------------------------------ No results for input(s): CHOL, HDL, LDLCALC, TRIG, CHOLHDL, LDLDIRECT in the last 72 hours.  Lab Results  Component Value Date   HGBA1C 4.9 02/02/2016   ------------------------------------------------------------------------------------------------------------------  Recent  Labs  04/24/16 0825  TSH 0.805   ------------------------------------------------------------------------------------------------------------------ No results for input(s): VITAMINB12, FOLATE, FERRITIN, TIBC, IRON, RETICCTPCT in the last 72 hours.  Coagulation profile No results for input(s): INR, PROTIME in the last 168 hours.  No results for input(s): DDIMER in the last 72 hours.  Cardiac Enzymes  Recent Labs Lab 04/22/16 0635  TROPONINI <0.03   ------------------------------------------------------------------------------------------------------------------ No results found for: BNP  Inpatient Medications  Scheduled Meds: . ALPRAZolam  1 mg Oral TID  . feeding supplement  1 Container Oral TID BM  . folic acid  1 mg Oral Daily  . gi cocktail  30 mL Oral Once  . multivitamin with minerals  1 tablet Oral Daily  . oxymetazoline  1 spray Each Nare BID  . pantoprazole  40 mg Oral Daily  . thiamine  100 mg Oral Daily   Continuous Infusions: . dextrose 75 mL/hr at 04/25/16 2041   PRN Meds:.acetaminophen **OR** acetaminophen, ondansetron **OR** ondansetron (ZOFRAN) IV, oxyCODONE-acetaminophen **AND** oxyCODONE, zolpidem  Micro Results No results found for this or any previous visit (from the past 240 hour(s)).  Radiology Reports Ct Head Wo Contrast  Result Date: 04/22/2016 CLINICAL DATA:  Transient altered mental status.  Alcohol use. EXAM: CT HEAD WITHOUT CONTRAST TECHNIQUE: Contiguous axial images were obtained from the base of the skull through the vertex without intravenous contrast. COMPARISON:  None. FINDINGS: Brain: No evidence of acute infarction, hemorrhage, hydrocephalus, extra-axial collection or mass lesion/mass effect. Vascular: No hyperdense vessel or unexpected calcification. Skull: Normal. Negative for fracture or focal lesion. Sinuses/Orbits: Paranasal sinuses and mastoid air cells are clear. The visualized orbits are unremarkable. Other: None. IMPRESSION: No  acute intracranial abnormality. Electronically Signed   By: Jeb Levering M.D.   On: 04/22/2016 02:49   Dg Abd Acute W/chest  Result Date: 04/22/2016 CLINICAL DATA:  Nausea.  Abdominal pain. EXAM: DG ABDOMEN ACUTE W/ 1V CHEST COMPARISON:  07/20/2014 chest radiograph. 02/04/2016 CT abdomen/pelvis. FINDINGS: Stable cardiomediastinal silhouette with normal heart size. No pneumothorax. No pleural effusion. Lungs appear clear, with no acute consolidative airspace disease and no pulmonary edema. No dilated small bowel loops or air-fluid levels. Moderate colonic stool volume. No evidence of pneumatosis or pneumoperitoneum. Surgical sutures from gastrojejunostomy and enteroenterostomy in the left abdomen are again noted. Bilateral iliac atherosclerosis. Stable calcified venous phleboliths in the left deep pelvis. Otherwise no pathologic soft tissue calcifications. IMPRESSION: 1. No active disease in the chest. 2. Nonobstructive bowel gas pattern. 3. Moderate colonic stool volume, which may indicate constipation. Electronically Signed   By: Ilona Sorrel M.D.   On: 04/22/2016 15:59    Time Spent in minutes  25   Louellen Molder M.D on 04/26/2016 at 11:20 AM  Between 7am to 7pm - Pager - [phone]  After 7pm go to [email or web address] - password [email or web address]  [facility] -  Office  [phone]

## 2016-04-27 ENCOUNTER — Other Ambulatory Visit: Payer: Self-pay

## 2016-04-27 DIAGNOSIS — Z789 Other specified health status: Secondary | ICD-10-CM | POA: Diagnosis present

## 2016-04-27 DIAGNOSIS — D509 Iron deficiency anemia, unspecified: Secondary | ICD-10-CM | POA: Diagnosis present

## 2016-04-27 DIAGNOSIS — K222 Esophageal obstruction: Secondary | ICD-10-CM

## 2016-04-27 DIAGNOSIS — G894 Chronic pain syndrome: Secondary | ICD-10-CM | POA: Diagnosis present

## 2016-04-27 DIAGNOSIS — E46 Unspecified protein-calorie malnutrition: Secondary | ICD-10-CM

## 2016-04-27 DIAGNOSIS — Z7289 Other problems related to lifestyle: Secondary | ICD-10-CM | POA: Diagnosis present

## 2016-04-27 DIAGNOSIS — E538 Deficiency of other specified B group vitamins: Secondary | ICD-10-CM

## 2016-04-27 LAB — GLUCOSE, CAPILLARY
Glucose-Capillary: 107 mg/dL — ABNORMAL HIGH (ref 65–99)
Glucose-Capillary: 93 mg/dL (ref 65–99)
Glucose-Capillary: 85 mg/dL (ref 65–99)
Glucose-Capillary: 98 mg/dL (ref 65–99)
Glucose-Capillary: 43 mg/dL — CL (ref 65–99)

## 2016-04-27 LAB — HEMOGLOBIN A1C
Hgb A1c MFr Bld: 4.8 % (ref 4.8–5.6)
Mean Plasma Glucose: 91 mg/dL

## 2016-04-27 MED ORDER — KETOROLAC TROMETHAMINE 30 MG/ML IJ SOLN
30.0000 mg | Freq: Once | INTRAMUSCULAR | Status: AC
  Administered 2016-04-27: 30 mg via INTRAVENOUS
  Filled 2016-04-27: qty 1

## 2016-04-27 MED ORDER — BOOST / RESOURCE BREEZE PO LIQD: 1.0000 | Freq: Three times a day (TID) | ORAL | 0 refills | Status: DC

## 2016-04-27 NOTE — Discharge Summary (Signed)
Physician Discharge Summary  Angie [name] [id number] DOB: 1965/10/15 DOA: 04/22/2016  PCP: Ricke Hey, MD  Admit date: 04/22/2016 Discharge date: 04/27/2016  Admitted From: Home Disposition:  Home  Recommendations for Outpatient Follow-up:  1. Follow up with PCP in 1-2 weeks. Patient needs monthly vitamin B12 injections. Please follow sulfonylurea panel and pro-insulin during outpt follow up. 2. Patient should be on a pured diet for 3. Patient should follow-up with [facility] in 4 weeks for repeat EGD. GI will decide on outpatient management of her iron deficiency (patient had acute allergy reactions to Nulecit while in the hospital)  Home Health: None Equipment/Devices: None  Discharge Condition: stable CODE STATUS: Full code Diet recommendation: Dysphagia level I (pured diet) for 2 months, along with nutritional supplements.    Discharge Diagnoses:  Principal Problem:   Hypoglycemia   Active Problems:    Protein-calorie malnutrition, severe   Anastomotic stricture of gastrojejunostomy   S/P gastric bypass   Syncope   Nausea and vomiting   Abdominal pain, chronic, epigastric   Alcohol use   Chronic pain syndrome   Iron deficiency anemia   B12 deficiency  Brief narrative/history of present illness 51 year old female with alcohol abuse, history of gastric bypass, anxiety, fibromyalgia, iron deficiency anemia, chronic narcotic use due to bursitis and fibromyalgia brought to the ED after a witnessed syncope by a friend. In the ED she was found to be hypoglycemic repeatedly and placed on IV dextrose. Patient reports drinking vodka along with hard lemonade every day. It was unclear if she had a seizure episode. No bowel/urinary incontinence or tongue bite. CT head was unremarkable. In the ED she received D50 followed by D5 with normal saline. Placed on observation. Patient informs that she has difficulty keeping both solid and liquid down on several occasions for  past 4-5 months and has been losing weight. Also reports taking Naprosyn regularly for her fibromyalgia. She was hospitalized in October for hypotension and dizziness and was transfused for low hemoglobin.   Principal Problem:  Persistent Hypoglycemia Low normal levels despite continuous D10.  cortisol of 9. TSH normal. B-hydroxybutyrate  normal. C-peptide normal. A1c of 4.8. Sulfonylurea panel and proinsulin level sent and should be followed as outpatient. CBG has been stable past 48 hours of D10 drip. Given current lab results it appears this is likely due to poor nutritional intake rather than insulinoma.      Syncope suspect secondary to hypoglycemia. EEG negative for seizures. Head CT unremarkable. 2-D echo with normal EF and no wall motion abnormality. Remains asymptomatic while in the hospital.     Active Problems:    Nausea and vomiting with history of gastric bypass/ severe malnutrition/cachexia - Sent taking NSAIDs regularly for fibromyalgia. Also has ongoing alcohol use. -GI consult appreciated. 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 -GI recommend pured diet for 2 months and avoiding aspirin and any other NSAIDs. Recommend upper endoscopy in 1 month to evaluate response to therapy and need for further dilatation.   Severe iron deficiency anemia Possibly due to poor by mouth intake and malabsorption from bypass surgery. Given IV iron (nulecit) but had severe allergy reaction after completion. She is unlikely to absorb oral iron due to her gastric bypass. Low B12 (248). Given B12 injection. (She will need monthly B12 injections as outpatient) Hemoglobin improved and stable. -GI will decide on her outpatient iron therapy during follow-up visit.   Alcohol abuse No signs of  withdrawal.  Counseled on cessation.  Hypokalemia Replenished   Chronic pain Continue her home  pain medications.   Family Communication  : None at bedside  Disposition Plan  : Home  Consults  :  [facility]  Procedures  : EGD  Discharge Instructions   Allergies as of 04/27/2016      Reactions   Iron    Had acute allergy with tachycardia, attention and generalized itching shortly after receiving nulecit ( iron gluconate) infusion.      Medication List    STOP taking these medications   gabapentin 300 MG capsule Commonly known as:  NEURONTIN   naproxen 375 MG tablet Commonly known as:  NAPROSYN   pantoprazole 40 MG tablet Commonly known as:  PROTONIX     TAKE these medications   ALPRAZolam 1 MG tablet Commonly known as:  XANAX Take 1 mg by mouth 3 (three) times daily.   feeding supplement Liqd Take 1 Container by mouth 3 (three) times daily between meals.   multivitamin with minerals Tabs tablet Take 1 tablet by mouth daily.   omeprazole 20 MG capsule Commonly known as:  PRILOSEC Take 20 mg by mouth daily.   ondansetron 4 MG disintegrating tablet Commonly known as:  ZOFRAN ODT Take 1 tablet (4 mg total) by mouth every 8 (eight) hours as needed for nausea.   oxyCODONE-acetaminophen 10-325 MG tablet Commonly known as:  PERCOCET Take 1 tablet by mouth every 4 (four) hours as needed for pain.   oxymetazoline 0.05 % nasal spray Commonly known as:  AFRIN Place 1 spray into both nostrils 2 (two) times daily as needed for congestion.      Follow-up Information    Ricke Hey, MD. Schedule an appointment as soon as possible for a visit in 1 week(s).   Specialty:  Family Medicine Contact information: [place] [address] [phone]        Henry L Danis III, MD Follow up in 1 month(s).   Specialty:  Gastroenterology Why:  office will call with appt Contact information: [address] [place] [address] [phone]          Allergies  Allergen Reactions  . Iron     Had acute allergy with  tachycardia, attention and generalized itching shortly after receiving nulecit ( iron gluconate) infusion.      Procedures/Studies: Ct Head Wo Contrast  Result Date: 04/22/2016 CLINICAL DATA:  Transient altered mental status.  Alcohol use. EXAM: CT HEAD WITHOUT CONTRAST TECHNIQUE: Contiguous axial images were obtained from the base of the skull through the vertex without intravenous contrast. COMPARISON:  None. FINDINGS: Brain: No evidence of acute infarction, hemorrhage, hydrocephalus, extra-axial collection or mass lesion/mass effect. Vascular: No hyperdense vessel or unexpected calcification. Skull: Normal. Negative for fracture or focal lesion. Sinuses/Orbits: Paranasal sinuses and mastoid air cells are clear. The visualized orbits are unremarkable. Other: None. IMPRESSION: No acute intracranial abnormality. Electronically Signed   By: Jeb Levering M.D.   On: 04/22/2016 02:49   Dg Abd Acute W/chest  Result Date: 04/22/2016 CLINICAL DATA:  Nausea.  Abdominal pain. EXAM: DG ABDOMEN ACUTE W/ 1V CHEST COMPARISON:  07/20/2014 chest radiograph. 02/04/2016 CT abdomen/pelvis. FINDINGS: Stable cardiomediastinal silhouette with normal heart size. No pneumothorax. No pleural effusion. Lungs appear clear, with no acute consolidative airspace disease and no pulmonary edema. No dilated small bowel loops or air-fluid levels. Moderate colonic stool volume. No evidence of pneumatosis or pneumoperitoneum. Surgical sutures from gastrojejunostomy and  enteroenterostomy in the left abdomen are again noted. Bilateral iliac atherosclerosis. Stable calcified venous phleboliths in the left deep pelvis. Otherwise no pathologic soft tissue calcifications. IMPRESSION: 1. No active disease in the chest. 2. Nonobstructive bowel gas pattern. 3. Moderate colonic stool volume, which may indicate constipation. Electronically Signed   By: Ilona Sorrel M.D.   On: 04/22/2016 15:59       Subjective: Complains of generalized  pain. Has been tolerating diet and CBG stable.  Discharge Exam: Vitals:   04/26/16 2136 04/27/16 0540  BP: 121/74 102/64  Pulse: (!) 102 73  Resp:  16  Temp: 98.4 F (36.9 C) 97.9 F (36.6 C)   Vitals:   04/26/16 0534 04/26/16 1517 04/26/16 2136 04/27/16 0540  BP: 124/78 139/80 121/74 102/64  Pulse: 89 98 (!) 102 73  Resp: 16 18  16   Temp: 97.4 F (36.3 C)  98.4 F (36.9 C) 97.9 F (36.6 C)  TempSrc: Oral  Oral Oral  SpO2:   100% 100%  Weight:      Height:        [name] female not in distress HEENT: Pallor present, Moist mucosa,  supple neck, Chest: clear b/l, no added sounds CVS: N S1&S2, no murmurs, rubs or gallop GI: soft, mild epigastric tenderness, ND, BS+ Musculoskeletal: warm, no edema   The results of significant diagnostics from this hospitalization (including imaging, microbiology, ancillary and laboratory) are listed below for reference.     Microbiology: No results found for this or any previous visit (from the past 240 hour(s)).   Labs: BNP (last 3 results) No results for input(s): BNP in the last 8760 hours. Basic Metabolic Panel:  Recent Labs Lab 04/22/16 0034 04/22/16 0042 04/22/16 0635 04/24/16 0705  NA 134* 135 135 137  K 3.1* 3.2* 3.3* 3.7  CL 99* 98* 100* 106  CO2 23  --  27 23  GLUCOSE 70 73 68 104*  BUN <5* <3* <5* <5*  CREATININE 0.63 0.50 0.53 0.67  CALCIUM 9.1  --  8.6* 8.6*   Liver Function Tests:  Recent Labs Lab 04/22/16 0034 04/22/16 0635  AST 18 13*  ALT 12* 11*  ALKPHOS 66 58  BILITOT 0.4 0.5  PROT 6.8 6.0*  ALBUMIN 3.9 3.3*   No results for input(s): LIPASE, AMYLASE in the last 168 hours. No results for input(s): AMMONIA in the last 168 hours. CBC:  Recent Labs Lab 04/22/16 0034 04/22/16 0042 04/22/16 0635 04/23/16 0706 04/24/16 0705  WBC 4.0  --  5.6 10.3 6.0  NEUTROABS 2.3  --  3.9  --   --   HGB 10.8* 11.6* 9.4* 12.3 10.2*  HCT 32.0* 34.0* 28.1* 36.8 31.7*  MCV 95.0  --  92.7 95.1 98.1   PLT 332  --  332 346 295   Cardiac Enzymes:  Recent Labs Lab 04/22/16 0635  TROPONINI <0.03   BNP: Invalid input(s): POCBNP CBG:  Recent Labs Lab 04/26/16 2357 04/27/16 0228 04/27/16 0450 04/27/16 0556 04/27/16 0810  GLUCAP 110* 107* 93 85 98   D-Dimer No results for input(s): DDIMER in the last 72 hours. Hgb A1c  Recent Labs  04/25/16 0848  HGBA1C 4.8   Lipid Profile No results for input(s): CHOL, HDL, LDLCALC, TRIG, CHOLHDL, LDLDIRECT in the last 72 hours. Thyroid function studies No results for input(s): TSH, T4TOTAL, T3FREE, THYROIDAB in the last 72 hours.  Invalid input(s): FREET3 Anemia work up No results for input(s): VITAMINB12, FOLATE, FERRITIN, TIBC, IRON, RETICCTPCT in the last  72 hours. Urinalysis    Component Value Date/Time   COLORURINE COLORLESS (A) 04/22/2016 0038   APPEARANCEUR CLEAR 04/22/2016 0038   LABSPEC 1.004 (L) 04/22/2016 0038   PHURINE 7.0 04/22/2016 0038   GLUCOSEU 50 (A) 04/22/2016 0038   HGBUR NEGATIVE 04/22/2016 0038   BILIRUBINUR NEGATIVE 04/22/2016 0038   KETONESUR NEGATIVE 04/22/2016 0038   PROTEINUR NEGATIVE 04/22/2016 0038   UROBILINOGEN 0.2 10/26/2012 1115   NITRITE NEGATIVE 04/22/2016 0038   LEUKOCYTESUR NEGATIVE 04/22/2016 0038   Sepsis Labs Invalid input(s): PROCALCITONIN,  WBC,  LACTICIDVEN Microbiology No results found for this or any previous visit (from the past 240 hour(s)).   Time coordinating discharge: Over 30 minutes  SIGNED:   Louellen Molder, MD  [facility] 04/27/2016, 10:51 AM Pager   If 7PM-7AM, please contact night-coverage [email or web address] Password [email or web address]

## 2016-04-27 NOTE — Discharge Instructions (Addendum)
Hypoglycemia  Hypoglycemia is when the sugar (glucose) level in the blood is too low. Symptoms of low blood sugar may include:  Feeling:  Hungry.  Worried or nervous (anxious).  Sweaty and clammy.  Confused.  Dizzy.  Sleepy.  Sick to your stomach (nauseous).  Having:  A fast heartbeat.  A headache.  A change in your vision.  Jerky movements that you cannot control (seizure).  Nightmares.  Tingling or no feeling (numbness) around the mouth, lips, or tongue.  Having trouble with:  Talking.  Paying attention (concentrating).  Moving (coordination).  Sleeping.  Shaking.  Passing out (fainting).  Getting upset easily (irritability). Low blood sugar can happen to people who have diabetes and people who do not have diabetes. Low blood sugar can happen quickly, and it can be an emergency. Treating Low Blood Sugar  Low blood sugar is often treated by eating or drinking something sugary right away. If you can think clearly and swallow safely, follow the 15:15 rule:  Take 15 grams of a fast-acting carb (carbohydrate). Some fast-acting carbs are:  1 tube of glucose gel.  3 sugar tablets (glucose pills).  6-8 pieces of hard candy.  4 oz (120 mL) of fruit juice.  4 oz (120 mL) of regular (not diet) soda.  Check your blood sugar 15 minutes after you take the carb.  If your blood sugar is still at or below 70 mg/dL (3.9 mmol/L), take 15 grams of a carb again.  If your blood sugar does not go above 70 mg/dL (3.9 mmol/L) after 3 tries, get help right away.  After your blood sugar goes back to normal, eat a meal or a snack within 1 hour. Treating Very Low Blood Sugar  If your blood sugar is at or below 54 mg/dL (3 mmol/L), you have very low blood sugar (severe hypoglycemia). This is an emergency. Do not wait to see if the symptoms will go away. Get medical help right away. Call your local emergency services (911 in the [place] Do not drive yourself to the  hospital. If you have very low blood sugar and you cannot eat or drink, you may need a glucagon shot (injection). A family member or friend should learn how to check your blood sugar and how to give you a glucagon shot. Ask your doctor if you need to have a glucagon shot kit at home. Follow these instructions at home: General instructions  Avoid any diets that cause you to not eat enough food. Talk with your doctor before you start any new diet.  Take over-the-counter and prescription medicines only as told by your doctor.  Limit alcohol to no more than 1 drink per day for nonpregnant women and 2 drinks per day for men. One drink equals 12 oz of beer, 5 oz of wine, or 1 oz of hard liquor.  Keep all follow-up visits as told by your doctor. This is important. If You Have Diabetes:   Make sure you know the symptoms of low blood sugar.  Always keep a source of sugar with you, such as:  Sugar.  Sugar tablets.  Glucose gel.  Fruit juice.  Regular soda (not diet soda).  Milk.  Hard candy.  Honey.  Take your medicines as told.  Follow your exercise and meal plan.  Eat on time. Do not skip meals.  Follow your sick day plan when you cannot eat or drink normally. Make this plan ahead of time with your doctor.  Check your blood sugar as  often as told by your doctor. Always check before and after exercise.  Share your diabetes care plan with:  Your work or school.  People you live with.  Check your pee (urine) for ketones:  When you are sick.  As told by your doctor.  Carry a card or wear jewelry that says you have diabetes. If You Have Low Blood Sugar From Other Causes:   Check your blood sugar as often as told by your doctor.  Follow instructions from your doctor about what you cannot eat or drink. Contact a doctor if:  You have trouble keeping your blood sugar in your target range.  You have low blood sugar often. Get help right away if:  You still have  symptoms after you eat or drink something sugary.  Your blood sugar is at or below 54 mg/dL (3 mmol/L).  You have jerky movements that you cannot control.  You pass out. These symptoms may be an emergency. Do not wait to see if the symptoms will go away. Get medical help right away. Call your local emergency services (911 in the [place] Do not drive yourself to the hospital.  This information is not intended to replace advice given to you by your health care provider. Make sure you discuss any questions you have with your health care provider. Document Released: 07/07/2009 Document Revised: 09/18/2015 Document Reviewed: 05/16/2015   Dysphagia Diet Level 1, Pureed The dysphasia level 1 diet includes foods that are completely pureed and smooth. The foods have a pudding-like texture, such as the texture of pureed pancakes, mashed potatoes, and yogurt. The diet does not include foods with lumps or coarse textures. Liquids should be smooth and may either be thin, nectar-thick, honey-like, or spoon-thick. This diet is helpful for people with moderate to severe swallowing problems. It reduces the risk of food getting caught in the windpipe, trachea, or lungs. You may need help or supervision during meals while following this diet. What do I need to know about this diet? Foods  You may eat foods that are soft and have a pudding-like texture. If a food does not have this texture, you may be able to eat the food after:  Pureeing it. This can be done with a blender or whisk.  Moistening it with liquid. For example, you may have bread if you soak it in milk or syrup.  Avoid foods that are hard, dry, sticky, chunky, lumpy, or stringy. Also avoid foods with nuts, seeds, raisins, skins, and pulp.  Do not eat foods that you have to chew. If you have to chew the food, then you cannot eat it.  Eat a variety of foods to get all the nutrients you need. Liquids  You may drink liquids that are smooth. Your  health care provider will tell you if you should drink thin or thickened liquids.  To thicken a liquid, use a food and beverage thickener or a thickening food. Thickened liquids are usually a pudding-like consistency.  Thin liquids include fruit juices, milk, coffee, tea, yogurts, shakes, and similar foods that melt to thin liquid at room temperature.  Avoid liquids with seeds, pulp, or chunks. See your dietitian or health care provider regularly for help with your dietary changes. What foods can I eat? Grains  Store-bought soft breads, pancakes, and [place] toast that have a smooth, moist texture and do not have nuts or seeds (you will need to moisten the food with liquid). Cooked cereals that have a pudding-like consistency, such as  cream of wheat or farina (no oatmeal). Pureed, well-cooked pasta, rice, and plain bread stuffing. Vegetables  Pureed vegetables. Soft avocado. Smooth tomato paste or sauce. Strained or pureed soups (these may need to be thickened as directed). Mashed or pureed potatoes without skin (can be seasoned with butter, smooth gravy, margarine, or sour cream). Fruits  Pureed fruits such as melons and apples without seeds or pulp. Mashed bananas. Smooth tomato paste or sauce. Fruit juices without pulp or seeds. Strained or pureed soups. Meat and Other Protein Sources  Pureed meat. Smooth pate or liverwurst. Smooth souffles. Pureed beans (such as lentils). Pureed eggs. Dairy  Yogurt. Smooth cheese sauces. Milk (may need to be thickened). Nutritional dairy drinks or shakes. Ask your health care provider whether you can have ice cream. Condiments  Finely ground salt, pepper, and other ground spices. Sweets/Desserts  Smooth puddings and custards. Pureed desserts. Souffles. Whipped topping. Ask your health care provider whether you can have frozen desserts. Fats and Oils  Butter. Margarine. Smooth and strained gravy. Sour cream. Mayonnaise. Cream cheese. Whipped topping.  Smooth sauces (such as white sauce, cheese sauce, or hollandaise sauce). The items listed above may not be a complete list of recommended foods or beverages. Contact your dietitian for more options.  What foods are not recommended? Grains  Oatmeal. Dry cereals. Hard breads. Vegetables  Whole vegetables. Stringy vegetables (such as celery). Thin tomato sauce. Fruits  Whole fresh, frozen, canned, or dried fruits that have not been pureed. Stringy fruits (such as pineapple). Meat and Other Protein Sources  Whole or ground meat, fish, or poultry. Dried or cooked lentils or legumes that have been cooked but not mashed or pureed. Non-pureed eggs. Nuts and seeds. Peanut butter. Dairy  Non-pureed cheese. Dairy products with lumps or chunks. Ask your health care provider whether you can have ice cream. Condiments  Coarse or seeded herbs and spices. Sweets/Desserts  [place] preserves. Jams with seeds. Solid desserts. Sticky, chewy sweets (such as licorice and caramel). Ask your health care provider whether you can have frozen desserts. Fats and Oils  Sauces of fats with lumps or chunks. The items listed above may not be a complete list of foods and beverages to avoid. Contact your dietitian for more information.  This information is not intended to replace advice given to you by your health care provider. Make sure you discuss any questions you have with your health care provider. Document Released: 04/12/2005 Document Revised: 09/18/2015 Document Reviewed: 03/26/2013 Elsevier Interactive Patient Education  2017 [employer].  Chartered certified accountant Patient Education  [employer].

## 2016-04-27 NOTE — Care Management Important Message (Signed)
Important Message  Patient Details  Name: Angie [name] MRN: [id number] Date of Birth: 1965/07/23   Medicare Important Message Given:  Yes    [name] 04/27/2016, 2:50 PM

## 2016-04-27 NOTE — Care Management Note (Signed)
Case Management Note  Patient Details  Name: Angie [name] MRN: [id number] Date of Birth: 18-Aug-1965  Subjective/Objective:                    Action/Plan:  DC to home, self care.   Expected Discharge Date:                  Expected Discharge Plan:  Home/Self Care  In-House Referral:     Discharge planning Services  CM Consult  Post Acute Care Choice:    Choice offered to:     DME Arranged:    DME Agency:     HH Arranged:    [place] Agency:     Status of Service:  Completed, signed off  If discussed at [employer] of Stay Meetings, dates discussed:    Additional Comments:  Carles Collet, RN 04/27/2016, 10:58 AM

## 2016-04-29 ENCOUNTER — Telehealth: Payer: Self-pay

## 2016-04-29 ENCOUNTER — Other Ambulatory Visit: Payer: Self-pay

## 2016-04-29 ENCOUNTER — Ambulatory Visit: Payer: Commercial Managed Care - HMO | Admitting: Nurse Practitioner

## 2016-04-29 DIAGNOSIS — K56699 Other intestinal obstruction unspecified as to partial versus complete obstruction: Secondary | ICD-10-CM

## 2016-04-29 DIAGNOSIS — R404 Transient alteration of awareness: Secondary | ICD-10-CM

## 2016-04-29 DIAGNOSIS — K9189 Other postprocedural complications and disorders of digestive system: Secondary | ICD-10-CM

## 2016-04-29 NOTE — Telephone Encounter (Signed)
Patient is scheduled at first available 0730 time at [facility] endoscopy on 05/27/16. Patient aware, and mailing pre-instructions today.

## 2016-04-29 NOTE — Telephone Encounter (Signed)
-----   Message from Nelida Meuse III, MD sent at 04/25/2016  5:19 PM EST ----- Gregary Signs,    Please see about a 7:30 case on Jan 24th or Jan 30th (check my schedule to be sure, but I think those are both 8:30 starts for office) It would be an EGD with balloon dilation of stricture at [place] lab.  - HD ----- Message ----- From: Willia Craze, NP Sent: 04/24/2016   9:29 AM To: Doran Stabler, MD, Doristine Counter, RN  Good morning,  Gregary Signs this patient needs an EGD with possible repeat balloon dilation to be done at the hospital with Dr. [name] in one month. Please contact patient next week with instructions/ date.  Thanks, Nevin Bloodgood

## 2016-05-02 LAB — PROINSULIN/INSULIN RATIO
Insulin: 3.7 u[IU]/mL
Proinsulin/Insulin Ratio: 38 %
Proinsulin: 9.4 pmol/L

## 2016-05-05 ENCOUNTER — Other Ambulatory Visit: Payer: Self-pay

## 2016-05-05 ENCOUNTER — Telehealth: Payer: Self-pay

## 2016-05-05 DIAGNOSIS — D509 Iron deficiency anemia, unspecified: Secondary | ICD-10-CM

## 2016-05-05 NOTE — Telephone Encounter (Signed)
Put referral in for hematology, called patient and left message to expect call re: appointment.

## 2016-05-05 NOTE — Telephone Encounter (Signed)
-----   Message from Willia Craze, NP sent at 05/05/2016  1:00 PM EST ----- Gweneth Fritter,  Can you refer this patient to Hematology for iron deficiency anemia. She had a reaction to IV iron in the hospital.  Thanks

## 2016-05-17 ENCOUNTER — Encounter: Payer: Self-pay | Admitting: Oncology

## 2016-05-17 ENCOUNTER — Telehealth: Payer: Self-pay | Admitting: Oncology

## 2016-05-17 NOTE — Telephone Encounter (Signed)
Pt confirmed appt, verified demo and insurance, mailed pt letter, in basket appt date/time to referring provider. °

## 2016-05-18 ENCOUNTER — Telehealth: Payer: Self-pay

## 2016-05-18 NOTE — Telephone Encounter (Signed)
Angie [name],     Hem Appt:    05/26/16@1pm  with Dr. Talbert Cage, and pt is aware of appointment.    Thanks  [employer]

## 2016-05-21 ENCOUNTER — Telehealth: Payer: Self-pay | Admitting: Oncology

## 2016-05-21 NOTE — Telephone Encounter (Signed)
Lt vm regarding rescheduling 01/31 appoinment

## 2016-05-25 ENCOUNTER — Telehealth: Payer: Self-pay | Admitting: Oncology

## 2016-05-25 NOTE — Telephone Encounter (Signed)
Lt vm regarding rescheduling 01/31 appointment

## 2016-05-25 NOTE — Telephone Encounter (Signed)
After offering patient alternate dates and time. Pt will call back to reschedule appt.

## 2016-05-26 NOTE — Anesthesia Preprocedure Evaluation (Addendum)
Anesthesia Evaluation  Patient identified by MRN, date of birth, ID band Patient awake    Reviewed: Allergy & Precautions, NPO status , Patient's Chart, lab work & pertinent test results  Airway Mallampati: II  TM Distance: >3 FB     Dental  (+) Edentulous Upper, Edentulous Lower   Pulmonary former smoker,    Pulmonary exam normal breath sounds clear to auscultation       Cardiovascular negative cardio ROS Normal cardiovascular exam Rhythm:Regular Rate:Normal     Neuro/Psych  Headaches, PSYCHIATRIC DISORDERS    GI/Hepatic GERD  Medicated,(+)     substance abuse  alcohol use, S/p gastric bypass Esophageal stricture   Endo/Other  negative endocrine ROSdiabetes  Renal/GU Renal disease     Musculoskeletal  (+) Arthritis , Fibromyalgia -  Abdominal   Peds  Hematology  (+) anemia ,   Anesthesia Other Findings   Reproductive/Obstetrics                            Anesthesia Physical  Anesthesia Plan  ASA: III  Anesthesia Plan: MAC   Post-op Pain Management:    Induction: Intravenous  Airway Management Planned: Simple Face Mask  Additional Equipment:   Intra-op Plan:   Post-operative Plan: Extubation in OR  Informed Consent: I have reviewed the patients History and Physical, chart, labs and discussed the procedure including the risks, benefits and alternatives for the proposed anesthesia with the patient or authorized representative who has indicated his/her understanding and acceptance.   Dental advisory given  Plan Discussed with: CRNA and Anesthesiologist  Anesthesia Plan Comments: (Discussed risks/benefits/alternatives to MAC sedation including need for ventilatory support, hypotension, need for conversion to general anesthesia.  All patient questions answered.  Patient/guardian wishes to proceed.)        Anesthesia Quick Evaluation

## 2016-05-27 ENCOUNTER — Ambulatory Visit (HOSPITAL_COMMUNITY)
Admission: RE | Admit: 2016-05-27 | Discharge: 2016-05-27 | Disposition: A | Discharge status: Home / Self Care | Payer: Medicare Other | Source: Ambulatory Visit | Attending: Gastroenterology | Admitting: Gastroenterology

## 2016-05-27 ENCOUNTER — Encounter (HOSPITAL_COMMUNITY)
Admission: RE | Disposition: A | Discharge status: Home / Self Care | Payer: Self-pay | Source: Ambulatory Visit | Attending: Gastroenterology

## 2016-05-27 ENCOUNTER — Ambulatory Visit (HOSPITAL_COMMUNITY): Payer: Medicare Other | Admitting: Anesthesiology

## 2016-05-27 ENCOUNTER — Encounter (HOSPITAL_COMMUNITY): Payer: Self-pay | Admitting: Certified Registered"

## 2016-05-27 ENCOUNTER — Telehealth: Payer: Self-pay | Admitting: Gastroenterology

## 2016-05-27 DIAGNOSIS — F1021 Alcohol dependence, in remission: Secondary | ICD-10-CM | POA: Insufficient documentation

## 2016-05-27 DIAGNOSIS — Z87891 Personal history of nicotine dependence: Secondary | ICD-10-CM | POA: Insufficient documentation

## 2016-05-27 DIAGNOSIS — K222 Esophageal obstruction: Secondary | ICD-10-CM

## 2016-05-27 DIAGNOSIS — Y832 Surgical operation with anastomosis, bypass or graft as the cause of abnormal reaction of the patient, or of later complication, without mention of misadventure at the time of the procedure: Secondary | ICD-10-CM | POA: Insufficient documentation

## 2016-05-27 DIAGNOSIS — F329 Major depressive disorder, single episode, unspecified: Secondary | ICD-10-CM | POA: Diagnosis not present

## 2016-05-27 DIAGNOSIS — F419 Anxiety disorder, unspecified: Secondary | ICD-10-CM | POA: Insufficient documentation

## 2016-05-27 DIAGNOSIS — K289 Gastrojejunal ulcer, unspecified as acute or chronic, without hemorrhage or perforation: Secondary | ICD-10-CM | POA: Insufficient documentation

## 2016-05-27 DIAGNOSIS — F319 Bipolar disorder, unspecified: Secondary | ICD-10-CM | POA: Diagnosis not present

## 2016-05-27 DIAGNOSIS — K9189 Other postprocedural complications and disorders of digestive system: Secondary | ICD-10-CM | POA: Diagnosis not present

## 2016-05-27 DIAGNOSIS — M1990 Unspecified osteoarthritis, unspecified site: Secondary | ICD-10-CM | POA: Insufficient documentation

## 2016-05-27 DIAGNOSIS — Z6820 Body mass index (BMI) 20.0-20.9, adult: Secondary | ICD-10-CM | POA: Diagnosis not present

## 2016-05-27 DIAGNOSIS — Z888 Allergy status to other drugs, medicaments and biological substances status: Secondary | ICD-10-CM | POA: Insufficient documentation

## 2016-05-27 DIAGNOSIS — G8929 Other chronic pain: Secondary | ICD-10-CM | POA: Diagnosis not present

## 2016-05-27 DIAGNOSIS — M545 Low back pain: Secondary | ICD-10-CM | POA: Insufficient documentation

## 2016-05-27 DIAGNOSIS — Z9884 Bariatric surgery status: Secondary | ICD-10-CM | POA: Insufficient documentation

## 2016-05-27 DIAGNOSIS — M797 Fibromyalgia: Secondary | ICD-10-CM | POA: Diagnosis not present

## 2016-05-27 DIAGNOSIS — E46 Unspecified protein-calorie malnutrition: Secondary | ICD-10-CM | POA: Insufficient documentation

## 2016-05-27 DIAGNOSIS — K9589 Other complications of other bariatric procedure: Secondary | ICD-10-CM | POA: Diagnosis not present

## 2016-05-27 DIAGNOSIS — E119 Type 2 diabetes mellitus without complications: Secondary | ICD-10-CM | POA: Insufficient documentation

## 2016-05-27 HISTORY — PX: ESOPHAGOGASTRODUODENOSCOPY: SHX5428

## 2016-05-27 HISTORY — PX: BALLOON DILATION: SHX5330

## 2016-05-27 SURGERY — EGD (ESOPHAGOGASTRODUODENOSCOPY)
Anesthesia: Monitor Anesthesia Care

## 2016-05-27 MED ORDER — LABETALOL HCL 5 MG/ML IV SOLN
INTRAVENOUS | Status: DC | PRN
  Administered 2016-05-27: 5 mg via INTRAVENOUS

## 2016-05-27 MED ORDER — LABETALOL HCL 5 MG/ML IV SOLN
INTRAVENOUS | Status: AC
  Filled 2016-05-27: qty 4

## 2016-05-27 MED ORDER — SODIUM CHLORIDE 0.9 % IV SOLN: INTRAVENOUS | Status: DC

## 2016-05-27 MED ORDER — PROPOFOL 500 MG/50ML IV EMUL
INTRAVENOUS | Status: DC | PRN
  Administered 2016-05-27: 100 ug/kg/min via INTRAVENOUS

## 2016-05-27 MED ORDER — LIDOCAINE 2% (20 MG/ML) 5 ML SYRINGE
INTRAMUSCULAR | Status: DC | PRN
  Administered 2016-05-27: 100 mg via INTRAVENOUS

## 2016-05-27 MED ORDER — LACTATED RINGERS IV SOLN
INTRAVENOUS | Status: DC
  Administered 2016-05-27: 1000 mL via INTRAVENOUS

## 2016-05-27 MED ORDER — PROPOFOL 10 MG/ML IV BOLUS
INTRAVENOUS | Status: AC
  Filled 2016-05-27: qty 40

## 2016-05-27 MED ORDER — PROPOFOL 10 MG/ML IV BOLUS
INTRAVENOUS | Status: DC | PRN
  Administered 2016-05-27 (×2): 50 mg via INTRAVENOUS

## 2016-05-27 MED ORDER — LIDOCAINE 2% (20 MG/ML) 5 ML SYRINGE
INTRAMUSCULAR | Status: AC
  Filled 2016-05-27: qty 5

## 2016-05-27 NOTE — Discharge Instructions (Signed)
YOU HAD AN ENDOSCOPIC PROCEDURE TODAY: Refer to the procedure report and other information in the discharge instructions given to you for any specific questions about what was found during the examination. If this information does not answer your questions, please call [place] office at [phone] to clarify.  ° °YOU SHOULD EXPECT: Some feelings of bloating in the abdomen. Passage of more gas than usual. Walking can help get rid of the air that was put into your GI tract during the procedure and reduce the bloating. If you had a lower endoscopy (such as a colonoscopy or flexible sigmoidoscopy) you may notice spotting of blood in your stool or on the toilet paper. Some abdominal soreness may be present for a day or two, also. ° °DIET: Your first meal following the procedure should be a light meal and then it is ok to progress to your normal diet. A half-sandwich or bowl of soup is an example of a good first meal. Heavy or fried foods are harder to digest and may make you feel nauseous or bloated. Drink plenty of fluids but you should avoid alcoholic beverages for 24 hours. If you had a esophageal dilation, please see attached instructions for diet.   ° °ACTIVITY: Your care partner should take you home directly after the procedure. You should plan to take it easy, moving slowly for the rest of the day. You can resume normal activity the day after the procedure however YOU SHOULD NOT DRIVE, use power tools, machinery or perform tasks that involve climbing or major physical exertion for 24 hours (because of the sedation medicines used during the test).  ° °SYMPTOMS TO REPORT IMMEDIATELY: °A gastroenterologist can be reached at any hour. Please call [phone]  for any of the following symptoms:  °Following lower endoscopy (colonoscopy, flexible sigmoidoscopy) °Excessive amounts of blood in the stool  °Significant tenderness, worsening of abdominal pains  °Swelling of the abdomen that is new, acute  °Fever of 100° or  higher  °Following upper endoscopy (EGD, EUS, ERCP, esophageal dilation) °Vomiting of blood or coffee ground material  °New, significant abdominal pain  °New, significant chest pain or pain under the shoulder blades  °Painful or persistently difficult swallowing  °New shortness of breath  °Black, tarry-looking or red, bloody stools ° °FOLLOW UP:  °If any biopsies were taken you will be contacted by phone or by letter within the next 1-3 weeks. Call [phone]  if you have not heard about the biopsies in 3 weeks.  °Please also call with any specific questions about appointments or follow up tests. ° °

## 2016-05-27 NOTE — H&P (Signed)
History:  This patient presents for endoscopic testing for vomiting and weight loss.  Angie [name] Referring physician: Ricke Hey, MD  Past Medical History: Past Medical History:  Diagnosis Date  . Abnormal Pap smear   . Alcohol abuse   . Anemia   . Anxiety    Takes xanax  . Arthritis    "qwhere" (04/26/2016)  . Bipolar disorder ([place])   . Bulging lumbar disc   . Chronic lower back pain   . Depression   . Fibromyalgia   . Headache    "weekly @ least" (04/26/2016)  . Lactose intolerance in adult 2017  . Migraines    "weekly @ least" (04/26/2016)  . Type II diabetes mellitus ([place])    "before the gastric bypass" (04/26/2016)     Past Surgical History: Past Surgical History:  Procedure Laterality Date  . CERVICAL CONE BIOPSY    . [place]; 1996; 1998; 2014  . CESAREAN SECTION WITH BILATERAL TUBAL LIGATION Bilateral 10/28/2012   Procedure: Repeat cesarean section with delivery of baby boy at 75. Apgars 8/9.  BILATERAL TUBAL LIGATION;  Surgeon: Florian Buff, MD;  Location: [facility];  Service: Obstetrics;  Laterality: Bilateral;  . DILATION AND CURETTAGE OF UTERUS    . ESOPHAGOGASTRODUODENOSCOPY (EGD) WITH PROPOFOL N/A 04/23/2016   Procedure: ESOPHAGOGASTRODUODENOSCOPY (EGD) WITH PROPOFOL;  Surgeon: Doran Stabler, MD;  Location: [facility];  Service: Endoscopy;  Laterality: N/A;  . ROUX-EN-Y GASTRIC BYPASS  2007  . TUBAL LIGATION  2014    Allergies: Allergies  Allergen Reactions  . Iron     Had acute allergy with tachycardia, attention and generalized itching shortly after receiving nulecit ( iron gluconate) infusion.    Outpatient Meds: Current Facility-Administered Medications  Medication Dose Route Frequency Provider Last Rate Last Dose  . 0.9 %  sodium chloride infusion   Intravenous Continuous Nelida Meuse III, MD      . lactated ringers infusion   Intravenous Continuous Nelida Meuse III, MD 10 mL/hr at 05/27/16 0716 1,000 mL at 05/27/16  0716      ___________________________________________________________________ Objective   Exam:  BP (!) 145/100   Pulse 74   Temp 97.5 F (36.4 C) (Oral)   Resp 10   Ht 5' (1.524 m)   Wt 107 lb (48.5 kg)   SpO2 100%   BMI 20.90 kg/m  malnourished  CV: RRR without murmur, S1/S2, no JVD, no peripheral edema  Resp: clear to auscultation bilaterally, normal RR and effort noted  GI: soft, mild epigastric tenderness, with active bowel sounds. No guarding or palpable organomegaly noted.  Neuro: awake, alert and oriented x 3. 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   Nelida Meuse III

## 2016-05-27 NOTE — Anesthesia Postprocedure Evaluation (Signed)
Anesthesia Post Note  Patient: Dilcia D Rockefeller  Procedure(s) Performed: Procedure(s) (LRB): ESOPHAGOGASTRODUODENOSCOPY (EGD) (N/A) BALLOON DILATION (N/A)  Patient location during evaluation: PACU Anesthesia Type: MAC Level of consciousness: awake and alert Pain management: pain level controlled Vital Signs Assessment: post-procedure vital signs reviewed and stable Respiratory status: spontaneous breathing, nonlabored ventilation, respiratory function stable and patient connected to nasal cannula oxygen Cardiovascular status: stable and blood pressure returned to baseline Anesthetic complications: no       Last Vitals:  Vitals:   05/27/16 0700 05/27/16 0811  BP: (!) 145/100 (!) 148/103  Pulse: 74   Resp: 10 16  Temp: 36.4 C 36.8 C    Last Pain:  Vitals:   05/27/16 0811  TempSrc: Oral                 Catalina Gravel

## 2016-05-27 NOTE — Telephone Encounter (Signed)
Angie [name],  I performed an upper endoscopy with balloon dilation of an anastomotic stricture on this patient today.  I instructed her to remain on a pure diet and to take protein calorie supplements such as Boost or Ensure. She is lactose intolerant, and says she needs boost juice or ensure clear. She believes these may be covered by her insurance. If she does have primary care I cannot seem to find out who it is whether she has even seen them since her recent hospital discharge. Please find out how one of these supplements can be prescribed for her. She needs 4 cans a day of either one.  I also need to perform a repeat upper endoscopy with balloon dilation of the stricture in about a month. KUB please see if there is open [facility] outpatient endoscopy time at 7:30 on Thursday, March 1. If not, will need to find another possible date.  I also see that she canceled her appointment yesterday with Hematology. I strongly recommend that she contact them to be seen as soon as possible since she has iron deficiency anemia due to her gastric bypass and had an allergic reaction to the IV iron preparation giving during the recent hospital stay. Another solution to iron supplementation needs to be explored, and she therefore needs to see Dr. Talbert Cage from Hematology soon.

## 2016-05-27 NOTE — Op Note (Signed)
[facility] Patient Name: Angie [name] Procedure Date: 05/27/2016 MRN: [id number] Attending MD: Estill Cotta. [name] , MD Date of Birth: 1966/02/06 CSN: [id number] Age: 51 Admit Type: Outpatient Procedure:                Upper GI endoscopy Indications:              Management of operative complication: Dilation of                            anastomotic stricture, Malnutrition, Vomiting,                            Weight loss Providers:                Mallie Mussel L. [name], MD, Elmer Ramp. Hinson, RN, [employer], Technician, [employer], CRNA Referring MD:              Medicines:                Monitored Anesthesia Care Complications:            No immediate complications. Estimated Blood Loss:     Estimated blood loss was minimal. Procedure:                Pre-Anesthesia Assessment:                           - Prior to the procedure, a History and Physical                            was performed, and patient medications and                            allergies were reviewed. The patient's tolerance of                            previous anesthesia was also reviewed. The risks                            and benefits of the procedure and the sedation                            options and risks were discussed with the patient.                            All questions were answered, and informed consent                            was obtained. Prior Anticoagulants: The patient has                            taken no previous anticoagulant or antiplatelet  agents. ASA Grade Assessment: II - A patient with                            mild systemic disease. After reviewing the risks                            and benefits, the patient was deemed in                            satisfactory condition to undergo the procedure.                           After obtaining informed consent, the endoscope was   passed under direct vision. Throughout the                            procedure, the patient's blood pressure, pulse, and                            oxygen saturations were monitored continuously. The                            Endoscope was introduced through the mouth, and                            advanced to the jejunum. The upper GI endoscopy was                            accomplished without difficulty. The patient                            tolerated the procedure well. Scope In: Scope Out: Findings:      The esophagus was normal.      Evidence of a gastric bypass was found. A gastric pouch with a normal       size was found. The staple line appeared intact. The gastrojejunal       anastomosis was characterized by severe stenosis and ulceration. The       stricture was about 26m in diameter (similar to when initially       discovered). This was traversed after dilation. A TTS dilator was passed       through the scope. Dilation with a 02-04-11 mm x 5.5 cm CRE balloon       dilator was performed to 12 mm. The scope was then able to pass with       mild resistance. Further dilation was performed with a 12-13.5-15 mm x       5.5 cm CRE balloon dilator was performed to 14.5 mm. The dilation site       was examined and showed significant improvement in luminal narrowing.      The examined jejunum was normal. Impression:               - Normal esophagus.                           - Gastric bypass with a normal-sized pouch and  intact staple line. 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                            Dilated.                           - Normal examined jejunum.                           - No specimens collected. Moderate Sedation:      MAC sedation used Recommendation:           - Patient has a contact number available for                            emergencies. The signs and  symptoms of potential                            delayed complications were discussed with the                            patient. Return to normal activities tomorrow.                            Written discharge instructions were provided to the                            patient.                           - Resume previous diet - puree. Crush all meds.                           - Continue present medications.                           - Repeat upper endoscopy in 1 month to evaluate the                            response to therapy. Procedure Code(s):        --- Professional ---                           [phone], Esophagogastroduodenoscopy, flexible,                            transoral; with dilation of gastric/duodenal                            stricture(s) (eg, balloon, bougie) Diagnosis Code(s):        --- Professional ---                           K28.9, Gastrojejunal ulcer, unspecified as acute or  chronic, without hemorrhage or perforation                           Q77.37, Other complications of other bariatric                            procedure                           K91.89, Other postprocedural complications and                            disorders of digestive system                           E46, Unspecified protein-calorie malnutrition                           R11.10, Vomiting, unspecified                           R63.4, Abnormal weight loss CPT copyright 2016 American Medical Association. All rights reserved. The codes documented in this report are preliminary and upon coder review may  be revised to meet current compliance requirements. Henry L. [name], MD 05/27/2016 8:09:22 AM This report has been signed electronically. Number of Addenda: 0

## 2016-05-27 NOTE — Transfer of Care (Signed)
Immediate Anesthesia Transfer of Care Note  Patient: Angie [name]  Procedure(s) Performed: Procedure(s): ESOPHAGOGASTRODUODENOSCOPY (EGD) (N/A) BALLOON DILATION (N/A)  Patient Location: PACU  Anesthesia Type:MAC  Level of Consciousness: awake, alert  and oriented  Airway & Oxygen Therapy: Patient Spontanous Breathing and Patient connected to nasal cannula oxygen  Post-op Assessment: Report given to RN and Post -op Vital signs reviewed and stable  Post vital signs: Reviewed and stable  Last Vitals:  Vitals:   05/27/16 0700 05/27/16 0811  BP: (!) 145/100 (!) 148/103  Pulse: 74   Resp: 10 16  Temp: 36.4 C 36.8 C    Last Pain:  Vitals:   05/27/16 0811  TempSrc: Oral         Complications: No apparent anesthesia complications

## 2016-05-27 NOTE — Interval H&P Note (Signed)
History and Physical Interval Note:  05/27/2016 7:39 AM  Angie [name]  has presented today for surgery, with the diagnosis of esophageal stricture  The various methods of treatment have been discussed with the patient and family. After consideration of risks, benefits and other options for treatment, the patient has consented to  Procedure(s): ESOPHAGOGASTRODUODENOSCOPY (EGD) (N/A) BALLOON DILATION (N/A) as a surgical intervention .  The patient's history has been reviewed, patient examined, no change in status, stable for surgery.  I have reviewed the patient's chart and labs.  Questions were answered to the patient's satisfaction.     Nelida Meuse III

## 2016-05-28 ENCOUNTER — Other Ambulatory Visit: Payer: Self-pay

## 2016-05-28 ENCOUNTER — Telehealth: Payer: Self-pay | Admitting: Oncology

## 2016-05-28 DIAGNOSIS — K9189 Other postprocedural complications and disorders of digestive system: Secondary | ICD-10-CM

## 2016-05-28 MED ORDER — BOOST / RESOURCE BREEZE PO LIQD: 1.0000 | Freq: Four times a day (QID) | ORAL | 3 refills | Status: DC

## 2016-05-28 NOTE — Telephone Encounter (Signed)
Pt call back to reschedule appt for 02/13 with Dr. Alen Blew

## 2016-05-28 NOTE — Telephone Encounter (Signed)
Spoke to patient, she is aware that she is scheduled for repeat EGD with dliation on 06/24/16 at 7:30 [facility]. Instructions mailed. She understands the need to reschedule with Dr. Talbert Cage and will call them today to get that appointment. Still working on [employer] or Ensure supplements.

## 2016-05-30 ENCOUNTER — Encounter (HOSPITAL_COMMUNITY): Payer: Self-pay | Admitting: Gastroenterology

## 2016-06-04 ENCOUNTER — Telehealth: Payer: Self-pay | Admitting: Gastroenterology

## 2016-06-04 MED ORDER — BOOST / RESOURCE BREEZE PO LIQD: 1.0000 | Freq: Four times a day (QID) | ORAL | 3 refills | Status: DC

## 2016-06-04 NOTE — Telephone Encounter (Signed)
Resent Boost RX to pharmacy as pt requested.

## 2016-06-08 ENCOUNTER — Telehealth: Payer: Self-pay | Admitting: Gastroenterology

## 2016-06-08 ENCOUNTER — Other Ambulatory Visit: Payer: Self-pay

## 2016-06-08 MED ORDER — ENSURE CLEAR PO LIQD: 1.0000 | Freq: Four times a day (QID) | ORAL | 2 refills | Status: AC

## 2016-06-08 NOTE — Telephone Encounter (Signed)
Spoke to patient, she was confused about who had called about having some blood work. She will call the [facility] to discuss the needed labs. Patient also inquired about the supplement drinks. She states she can only tolerate the Ensure Clear, despite the iron in this, she states she does not have any problems. [facility] is the only one that can get this for her. I called in the order, she is to have 4 per day.

## 2016-06-10 ENCOUNTER — Telehealth: Payer: Self-pay | Admitting: Gastroenterology

## 2016-06-10 NOTE — Telephone Encounter (Signed)
Patient is very frustrated, she states that she ate yogurt with tiny pieces of strawberries yesterday and this came back up. She says that following the EGD's w/ dilation, relief only lasts a few days afterwards. She is still having issues trying to get the supplement drinks, pharmacy that she spoke to yesterday said they could get them. They then had to transfer Rx to [facility] as they will be able to obtain them. Patient would like referral to a surgeon to correct this stricture. Patient would also like to cancel next EGD.Please advise.

## 2016-06-10 NOTE — Telephone Encounter (Signed)
Called patient back to let her know I would be sending a referral to [facility]. She is still scheduled for her EGD at [facility] on 06/24/16, I have not cancelled it. She would like to keep it, just incase she cannot get into see the surgeon sooner. She also admits to using alcohol, let her know that she needs to stop this as this can make the stricture worse. She denies use of NSAIDs or aspirin. I let her know that if she cannot keep anything down would need to go back to the ED. She voices her understanding.

## 2016-06-10 NOTE — Telephone Encounter (Signed)
I understand her frustration.  I told her at the outset dilation might not be able to correct this.  I certainly hope she no longer uses aspirin or NSAIDs like before, and I hope she no longer uses alcohol, especially after the recent admission for detox.  Those things make the stricture worse.  I would be happy to refer her to surgery, but I would like to reiterate that surgeons will be reluctant to do this because, unless the above behaviors have changed, the problem may very well just recur after surgery.   She is also malnourished, making surgery high risk.  If the doctor who performed her gastric bypass is not in this area, then please refer her to one of the bariatric surgeons at [facility].  It may take months to be seen, especially is she is uninsured.  As such, if she cannot keep anything down, she always has the option to come to the ED for admission like in December.    And she does not currently have a set appointment for an EGD, so it does not need to be canceled.

## 2016-06-10 NOTE — Telephone Encounter (Signed)
Error

## 2016-06-16 ENCOUNTER — Telehealth: Payer: Self-pay | Admitting: *Deleted

## 2016-06-16 NOTE — Telephone Encounter (Signed)
This RN called and reminded patient of her New patient appointment with Dr. Alen Blew tomorrow at 2 pm. Patient verbalized understanding.

## 2016-06-17 ENCOUNTER — Encounter: Payer: Medicare Other | Admitting: Oncology

## 2016-06-23 ENCOUNTER — Encounter (HOSPITAL_COMMUNITY): Payer: Self-pay | Admitting: Certified Registered Nurse Anesthetist

## 2016-06-24 ENCOUNTER — Encounter (HOSPITAL_COMMUNITY): Payer: Self-pay | Admitting: Certified Registered Nurse Anesthetist

## 2016-06-24 ENCOUNTER — Ambulatory Visit (HOSPITAL_COMMUNITY): Admission: RE | Admit: 2016-06-24 | Payer: Medicare Other | Source: Ambulatory Visit | Admitting: Gastroenterology

## 2016-06-24 ENCOUNTER — Telehealth: Payer: Self-pay

## 2016-06-24 ENCOUNTER — Encounter (HOSPITAL_COMMUNITY): Admission: RE | Payer: Self-pay | Source: Ambulatory Visit

## 2016-06-24 SURGERY — ESOPHAGOGASTRODUODENOSCOPY (EGD) WITH PROPOFOL
Anesthesia: Monitor Anesthesia Care

## 2016-06-24 MED ORDER — LIDOCAINE 2% (20 MG/ML) 5 ML SYRINGE
INTRAMUSCULAR | Status: AC
  Filled 2016-06-24: qty 5

## 2016-06-24 MED ORDER — PROPOFOL 10 MG/ML IV BOLUS
INTRAVENOUS | Status: AC
  Filled 2016-06-24: qty 40

## 2016-06-24 MED ORDER — ONDANSETRON HCL 4 MG/2ML IJ SOLN
INTRAMUSCULAR | Status: AC
  Filled 2016-06-24: qty 2

## 2016-06-24 NOTE — Telephone Encounter (Signed)
Patient No Showed for her EGD at the hospital today, did not call to cancel. Last I had spoke to patient was on 06/10/16 and she had planned on keeping the EGD appointment for 06/24/16.

## 2016-06-30 ENCOUNTER — Telehealth: Payer: Self-pay | Admitting: Gastroenterology

## 2016-07-02 ENCOUNTER — Encounter (HOSPITAL_COMMUNITY): Payer: Self-pay | Admitting: Emergency Medicine

## 2016-07-02 ENCOUNTER — Emergency Department (HOSPITAL_COMMUNITY)
Admission: EM | Admit: 2016-07-02 | Discharge: 2016-07-02 | Disposition: A | Discharge status: Home / Self Care | Payer: Medicare Other | Attending: Emergency Medicine | Admitting: Emergency Medicine

## 2016-07-02 DIAGNOSIS — Z87891 Personal history of nicotine dependence: Secondary | ICD-10-CM | POA: Diagnosis not present

## 2016-07-02 DIAGNOSIS — F1910 Other psychoactive substance abuse, uncomplicated: Secondary | ICD-10-CM | POA: Diagnosis not present

## 2016-07-02 DIAGNOSIS — Z79899 Other long term (current) drug therapy: Secondary | ICD-10-CM | POA: Diagnosis not present

## 2016-07-02 DIAGNOSIS — F10120 Alcohol abuse with intoxication, uncomplicated: Secondary | ICD-10-CM | POA: Diagnosis present

## 2016-07-02 DIAGNOSIS — E119 Type 2 diabetes mellitus without complications: Secondary | ICD-10-CM | POA: Insufficient documentation

## 2016-07-02 DIAGNOSIS — F1010 Alcohol abuse, uncomplicated: Secondary | ICD-10-CM

## 2016-07-02 NOTE — ED Provider Notes (Signed)
[facility] DEPT Provider Note   CSN: [id number] Arrival date & time: 07/02/16  1049     History   Chief Complaint Chief Complaint  Patient presents with  . Medical Clearance    HPI Angie [name] is a 51 y.o. female.  HPI  51 year old female with a history of alcohol and narcotic abuse presents to the ED for detox. States that she was home from [facility] 2 days ago after detoxing and there however relapsed yesterday with alcohol and narcotics. She tried to get into inpatient rehabilitation at a [facility] however requested that she detoxed prior to further management. Patient denies any other physical complaints at this time. Denies any suicidal ideations, homicidal ideations, AVH.  Past Medical History:  Diagnosis Date  . Abnormal Pap smear   . Alcohol abuse   . Anemia   . Anxiety    Takes xanax  . Arthritis    "qwhere" (04/26/2016)  . Bipolar disorder ([place])   . Bulging lumbar disc   . Chronic lower back pain   . Depression   . Fibromyalgia   . Headache    "weekly @ least" (04/26/2016)  . Lactose intolerance in adult 2017  . Migraines    "weekly @ least" (04/26/2016)  . Type II diabetes mellitus ([place])    "before the gastric bypass" (04/26/2016)    Patient Active Problem List   Diagnosis Date Noted  . Transient alteration of awareness   . Alcohol use 04/27/2016  . Chronic pain syndrome 04/27/2016  . Iron deficiency anemia 04/27/2016  . B12 deficiency 04/27/2016  . Malnutrition ([place])   . Gastrojejunal anastomotic stricture   . Hypoglycemia 04/22/2016  . Syncope 04/22/2016  . Protein-calorie malnutrition, severe 04/22/2016  . Nausea and vomiting   . Abdominal pain, chronic, epigastric   . Hypotension 02/01/2016  . Hyponatremia 02/01/2016  . Acute kidney injury ([place]) 02/01/2016  . Subacromial impingement of left shoulder 07/06/2013  . Trochanteric bursitis of left hip 05/11/2013  . Absolute anemia 07/24/2012  . Back pain complicating pregnancy 76/54/6503    . AMA (advanced maternal age) multigravida 35+ 05/16/2012  . Insufficient prenatal care 05/16/2012  . Previous cesarean section 05/16/2012  . Arthritis 05/16/2012  . S/P gastric bypass 05/16/2012    Past Surgical History:  Procedure Laterality Date  . BALLOON DILATION N/A 05/27/2016   Procedure: BALLOON DILATION;  Surgeon: Doran Stabler, MD;  Location: [facility];  Service: Gastroenterology;  Laterality: N/A;  . CERVICAL CONE BIOPSY    . [place]; 1996; 1998; 2014  . CESAREAN SECTION WITH BILATERAL TUBAL LIGATION Bilateral 10/28/2012   Procedure: Repeat cesarean section with delivery of baby boy at 54. Apgars 8/9.  BILATERAL TUBAL LIGATION;  Surgeon: Florian Buff, MD;  Location: [facility];  Service: Obstetrics;  Laterality: Bilateral;  . DILATION AND CURETTAGE OF UTERUS    . ESOPHAGOGASTRODUODENOSCOPY N/A 05/27/2016   Procedure: ESOPHAGOGASTRODUODENOSCOPY (EGD);  Surgeon: Doran Stabler, MD;  Location: [facility];  Service: Gastroenterology;  Laterality: N/A;  . ESOPHAGOGASTRODUODENOSCOPY (EGD) WITH PROPOFOL N/A 04/23/2016   Procedure: ESOPHAGOGASTRODUODENOSCOPY (EGD) WITH PROPOFOL;  Surgeon: Doran Stabler, MD;  Location: [facility];  Service: Endoscopy;  Laterality: N/A;  . ROUX-EN-Y GASTRIC BYPASS  2007  . TUBAL LIGATION  2014    OB History    Gravida Para Term Preterm AB Living   6 4 4  0 2 4   SAB TAB Ectopic Multiple Live Births   2  4       Home Medications    Prior to Admission medications   Medication Sig Start Date End Date Taking? Authorizing Provider  Oxycodone HCl 10 MG TABS Take 40 mg by mouth once.   Yes Historical Provider, MD  acetaminophen (TYLENOL) 500 MG tablet Take 1,000 mg by mouth every 6 (six) hours as needed (pain).     Historical Provider, MD  ALPRAZolam Duanne Moron) 1 MG tablet Take 1 mg by mouth 3 (three) times daily.  04/28/13   Historical Provider, MD  lidocaine (XYLOCAINE) 5 % ointment Apply 1 application topically 3  (three) times daily as needed for pain. 05/11/16   Historical Provider, MD  Nutritional Supplements (ENSURE CLEAR) LIQD Take 1 Bottle by mouth 4 (four) times daily. 06/08/16 07/08/16  Nelida Meuse III, MD  omeprazole (PRILOSEC) 20 MG capsule Take 20 mg by mouth daily.    Historical Provider, MD  ondansetron (ZOFRAN ODT) 4 MG disintegrating tablet Take 1 tablet (4 mg total) by mouth every 8 (eight) hours as needed for nausea. 02/12/16   Tanna Furry, MD  oxyCODONE-acetaminophen (PERCOCET) 10-325 MG tablet Take 1 tablet by mouth every 4 (four) hours as needed for pain.    Historical Provider, MD  oxymetazoline (AFRIN) 0.05 % nasal spray Place 1 spray into both nostrils 2 (two) times daily as needed for congestion. 02/05/16   Patrecia Pour, MD  pregabalin (LYRICA) 50 MG capsule Take 50 mg by mouth 3 (three) times daily.  05/07/16 06/18/16  Historical Provider, MD  QUEtiapine (SEROQUEL) 200 MG tablet Take 200 mg by mouth at bedtime.  05/07/16 06/18/16  Historical Provider, MD    Family History Family History  Problem Relation Age of Onset  . Diabetes Father   . Depression Maternal Grandmother   . Heart disease Maternal Grandfather   . Depression Paternal Grandmother     Social History Social History  Substance Use Topics  . Smoking status: Former Smoker    Packs/day: 0.50    Years: 4.00    Types: Cigarettes    Quit date: 03/16/2012  . Smokeless tobacco: Never Used  . Alcohol use 10.2 oz/week    6 Glasses of wine, 11 Shots of liquor per week     Comment: 04/26/2016 "I drink liquor and wire; no beer"; @ least 1 pint or more liquor;  @ least 6 Hard Mike's/week"     Allergies   Iron   Review of Systems Review of Systems Ten systems are reviewed and are negative for acute change except as noted in the HPI   Physical Exam Updated Vital Signs BP 145/90 (BP Location: Right Arm)   Pulse 82   Temp 98.7 F (37.1 C) (Oral)   Resp 18   LMP 06/10/2016 (Approximate)   SpO2 100%   Physical Exam   Constitutional: She is oriented to person, place, and time. She appears well-developed and well-nourished. No distress.  HENT:  Head: Normocephalic and atraumatic.  Nose: Nose normal.  Eyes: Conjunctivae and EOM are normal. Pupils are equal, round, and reactive to light. Right eye exhibits no discharge. Left eye exhibits no discharge. No scleral icterus.  Neck: Normal range of motion. Neck supple.  Cardiovascular: Normal rate and regular rhythm.  Exam reveals no gallop and no friction rub.   No murmur heard. Pulmonary/Chest: Effort normal and breath sounds normal. No stridor. No respiratory distress. She has no rales.  Abdominal: Soft. She exhibits no distension. There is no tenderness.  Musculoskeletal: She exhibits  no edema or tenderness.  Neurological: She is alert and oriented to person, place, and time.  Skin: Skin is warm and dry. No rash noted. She is not diaphoretic. No erythema.  Psychiatric: She has a normal mood and affect.  Vitals reviewed.    ED Treatments / Results  Labs (all labs ordered are listed, but only abnormal results are displayed) Labs Reviewed - No data to display  EKG  EKG Interpretation None       Radiology No results found.  Procedures Procedures (including critical care time)  Medications Ordered in ED Medications - No data to display   Initial Impression / Assessment and Plan / ED Course  I have reviewed the triage vital signs and the nursing notes.  Pertinent labs & imaging results that were available during my care of the patient were reviewed by me and considered in my medical decision making (see chart for details).     Unfortunately we do not provide detox services here. The patient appears well, in no acute distress, without evidence of toxicity or dehydration. They are interactive and following commands. She is clinically sober and coherent. I do not feel that obtaining labs will be beneficial or are necessary at this time. Pt  states that she will go back to [facility] for detox there. The patient is safe for discharge with strict return precautions.    Final Clinical Impressions(s) / ED Diagnoses   Final diagnoses:  Polysubstance abuse  Alcohol abuse   Disposition: Discharge  Condition: Good  I have discussed the results, Dx and Tx plan with the patient who expressed understanding and agree(s) with the plan. Discharge instructions discussed at great length. The patient was given strict return precautions who verbalized understanding of the instructions. No further questions at time of discharge.    Discharge Medication List as of 07/02/2016 12:27 PM      Follow Up: Ricke Hey, MD [place] [address] [phone]  Schedule an appointment as soon as possible for a visit  As needed      Fatima Blank, MD 07/02/16 1743

## 2016-07-02 NOTE — ED Triage Notes (Signed)
Patient reports she is here for medical clearance because she was trying to get into [facility] for detox from ETOH and oxycontin but they needed her to be medically cleared.  Patient denies homicidal and suicidal ideation.

## 2016-07-19 ENCOUNTER — Telehealth: Payer: Self-pay

## 2016-07-19 ENCOUNTER — Other Ambulatory Visit: Payer: Self-pay

## 2016-07-19 DIAGNOSIS — K222 Esophageal obstruction: Secondary | ICD-10-CM

## 2016-07-19 MED ORDER — ENSURE CLEAR PO LIQD: 1.0000 | Freq: Four times a day (QID) | ORAL | 11 refills | Status: DC

## 2016-07-19 NOTE — Telephone Encounter (Signed)
Patient is calling from a treatment center . She states she needs to speak to Dr. [name] nurse about her condition. She says inorder to get in contact with her you need to ask for her counselor Barnett Applebaum @ [phone]. I also made her aware that [facility] has been trying to reach her to schedule her. I gave her the number she says she would call them as well.
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
## 2016-07-19 NOTE — Telephone Encounter (Signed)
Patient notified and can do EGD with dilation at the [facility] on 3/28, to arrive 8:30 am. Faxed over prep instruction to [facility] at [phone]. Let patient know that someone will need to stay in the lobby, she will contact her daughter.   Patient said that she will be seeing Dr. Kieth Brightly on 4/12 at [facility].  Patient aware to have her facility pick up Rx for Ensure.

## 2016-07-19 NOTE — Telephone Encounter (Addendum)
I understand.  I can do an EGD with balloon dilation of this stricture this Wed the 28th in the [facility] if her facility can get her there between 8:30 and  9AM for a 10AM procedure.    Yes, please send the Rx for the supplements.  Please ask [facility] which physician she will be seeing so I can message them about her case.

## 2016-07-21 ENCOUNTER — Encounter: Payer: Self-pay | Admitting: Gastroenterology

## 2016-07-21 ENCOUNTER — Encounter (HOSPITAL_COMMUNITY): Payer: Self-pay

## 2016-07-21 ENCOUNTER — Ambulatory Visit (AMBULATORY_SURGERY_CENTER): Payer: Medicare Other | Admitting: Gastroenterology

## 2016-07-21 ENCOUNTER — Inpatient Hospital Stay (HOSPITAL_COMMUNITY)
Admission: AD | Admit: 2016-07-21 | Discharge: 2016-07-25 | DRG: 091 | Disposition: A | Discharge status: Home Health | Payer: Medicare Other | Source: Ambulatory Visit | Attending: Family Medicine | Admitting: Family Medicine

## 2016-07-21 VITALS — BP 164/106 | HR 67 | Temp 98.7°F | Resp 13 | Ht 60.0 in | Wt 107.0 lb

## 2016-07-21 DIAGNOSIS — E876 Hypokalemia: Secondary | ICD-10-CM | POA: Diagnosis present

## 2016-07-21 DIAGNOSIS — Z87891 Personal history of nicotine dependence: Secondary | ICD-10-CM

## 2016-07-21 DIAGNOSIS — G8929 Other chronic pain: Secondary | ICD-10-CM | POA: Diagnosis not present

## 2016-07-21 DIAGNOSIS — R569 Unspecified convulsions: Secondary | ICD-10-CM | POA: Diagnosis present

## 2016-07-21 DIAGNOSIS — R1013 Epigastric pain: Secondary | ICD-10-CM | POA: Diagnosis not present

## 2016-07-21 DIAGNOSIS — F1011 Alcohol abuse, in remission: Secondary | ICD-10-CM | POA: Diagnosis present

## 2016-07-21 DIAGNOSIS — R1310 Dysphagia, unspecified: Secondary | ICD-10-CM | POA: Diagnosis present

## 2016-07-21 DIAGNOSIS — D509 Iron deficiency anemia, unspecified: Secondary | ICD-10-CM | POA: Diagnosis present

## 2016-07-21 DIAGNOSIS — M545 Low back pain: Secondary | ICD-10-CM | POA: Diagnosis present

## 2016-07-21 DIAGNOSIS — R109 Unspecified abdominal pain: Secondary | ICD-10-CM | POA: Diagnosis present

## 2016-07-21 DIAGNOSIS — R64 Cachexia: Secondary | ICD-10-CM | POA: Diagnosis present

## 2016-07-21 DIAGNOSIS — T39395A Adverse effect of other nonsteroidal anti-inflammatory drugs [NSAID], initial encounter: Secondary | ICD-10-CM | POA: Diagnosis present

## 2016-07-21 DIAGNOSIS — E43 Unspecified severe protein-calorie malnutrition: Secondary | ICD-10-CM | POA: Diagnosis present

## 2016-07-21 DIAGNOSIS — G894 Chronic pain syndrome: Principal | ICD-10-CM

## 2016-07-21 DIAGNOSIS — Z789 Other specified health status: Secondary | ICD-10-CM | POA: Diagnosis present

## 2016-07-21 DIAGNOSIS — E119 Type 2 diabetes mellitus without complications: Secondary | ICD-10-CM | POA: Diagnosis present

## 2016-07-21 DIAGNOSIS — Z8249 Family history of ischemic heart disease and other diseases of the circulatory system: Secondary | ICD-10-CM

## 2016-07-21 DIAGNOSIS — Z681 Body mass index (BMI) 19 or less, adult: Secondary | ICD-10-CM

## 2016-07-21 DIAGNOSIS — K289 Gastrojejunal ulcer, unspecified as acute or chronic, without hemorrhage or perforation: Secondary | ICD-10-CM | POA: Diagnosis present

## 2016-07-21 DIAGNOSIS — M797 Fibromyalgia: Secondary | ICD-10-CM | POA: Diagnosis present

## 2016-07-21 DIAGNOSIS — F319 Bipolar disorder, unspecified: Secondary | ICD-10-CM | POA: Diagnosis present

## 2016-07-21 DIAGNOSIS — Z9884 Bariatric surgery status: Secondary | ICD-10-CM

## 2016-07-21 DIAGNOSIS — Z888 Allergy status to other drugs, medicaments and biological substances status: Secondary | ICD-10-CM

## 2016-07-21 DIAGNOSIS — K9189 Other postprocedural complications and disorders of digestive system: Secondary | ICD-10-CM

## 2016-07-21 DIAGNOSIS — D508 Other iron deficiency anemias: Secondary | ICD-10-CM | POA: Diagnosis not present

## 2016-07-21 DIAGNOSIS — Z833 Family history of diabetes mellitus: Secondary | ICD-10-CM

## 2016-07-21 DIAGNOSIS — K259 Gastric ulcer, unspecified as acute or chronic, without hemorrhage or perforation: Secondary | ICD-10-CM | POA: Diagnosis not present

## 2016-07-21 DIAGNOSIS — Z818 Family history of other mental and behavioral disorders: Secondary | ICD-10-CM

## 2016-07-21 DIAGNOSIS — Z7289 Other problems related to lifestyle: Secondary | ICD-10-CM | POA: Diagnosis present

## 2016-07-21 DIAGNOSIS — I10 Essential (primary) hypertension: Secondary | ICD-10-CM | POA: Diagnosis present

## 2016-07-21 DIAGNOSIS — Z9851 Tubal ligation status: Secondary | ICD-10-CM

## 2016-07-21 DIAGNOSIS — K831 Obstruction of bile duct: Secondary | ICD-10-CM | POA: Diagnosis present

## 2016-07-21 DIAGNOSIS — E739 Lactose intolerance, unspecified: Secondary | ICD-10-CM | POA: Diagnosis present

## 2016-07-21 DIAGNOSIS — Z79899 Other long term (current) drug therapy: Secondary | ICD-10-CM

## 2016-07-21 DIAGNOSIS — K9289 Other specified diseases of the digestive system: Secondary | ICD-10-CM | POA: Diagnosis present

## 2016-07-21 DIAGNOSIS — F419 Anxiety disorder, unspecified: Secondary | ICD-10-CM | POA: Diagnosis present

## 2016-07-21 LAB — CBC WITH DIFFERENTIAL/PLATELET
Basophils Absolute: 0 10*3/uL (ref 0.0–0.1)
Basophils Relative: 1 %
Eosinophils Absolute: 0 10*3/uL (ref 0.0–0.7)
Eosinophils Relative: 1 %
HCT: 31.4 % — ABNORMAL LOW (ref 36.0–46.0)
Hemoglobin: 10.8 g/dL — ABNORMAL LOW (ref 12.0–15.0)
Lymphocytes Relative: 34 %
Lymphs Abs: 1.3 10*3/uL (ref 0.7–4.0)
MCH: 30.5 pg (ref 26.0–34.0)
MCHC: 34.4 g/dL (ref 30.0–36.0)
MCV: 88.7 fL (ref 78.0–100.0)
Monocytes Absolute: 0.3 10*3/uL (ref 0.1–1.0)
Monocytes Relative: 7 %
Neutro Abs: 2.3 10*3/uL (ref 1.7–7.7)
Neutrophils Relative %: 57 %
Platelets: 484 10*3/uL — ABNORMAL HIGH (ref 150–400)
RBC: 3.54 MIL/uL — ABNORMAL LOW (ref 3.87–5.11)
RDW: 11.9 % (ref 11.5–15.5)
WBC: 3.9 10*3/uL — ABNORMAL LOW (ref 4.0–10.5)

## 2016-07-21 LAB — COMPREHENSIVE METABOLIC PANEL
ALT: 12 U/L — ABNORMAL LOW (ref 14–54)
AST: 15 U/L (ref 15–41)
Albumin: 4.2 g/dL (ref 3.5–5.0)
Alkaline Phosphatase: 80 U/L (ref 38–126)
Anion gap: 10 (ref 5–15)
BUN: 10 mg/dL (ref 6–20)
CO2: 20 mmol/L — ABNORMAL LOW (ref 22–32)
Calcium: 9.2 mg/dL (ref 8.9–10.3)
Chloride: 111 mmol/L (ref 101–111)
Creatinine, Ser: 0.57 mg/dL (ref 0.44–1.00)
GFR calc Af Amer: >60 mL/min (ref >60)
GFR calc non Af Amer: >60 mL/min (ref >60)
Glucose, Bld: 72 mg/dL (ref 65–99)
Potassium: 3.5 mmol/L (ref 3.5–5.1)
Sodium: 141 mmol/L (ref 135–145)
Total Bilirubin: 0.5 mg/dL (ref 0.3–1.2)
Total Protein: 7.9 g/dL (ref 6.5–8.1)

## 2016-07-21 LAB — TSH: TSH: 1.414 u[IU]/mL (ref 0.350–4.500)

## 2016-07-21 MED ORDER — SODIUM CHLORIDE 0.9 % IV SOLN
INTRAVENOUS | Status: AC
  Administered 2016-07-21 – 2016-07-22 (×2): via INTRAVENOUS

## 2016-07-21 MED ORDER — OXYCODONE-ACETAMINOPHEN 5-325 MG PO TABS
1.0000 | ORAL_TABLET | ORAL | Status: DC | PRN
  Administered 2016-07-21 – 2016-07-23 (×10): 1 via ORAL
  Filled 2016-07-21 (×10): qty 1

## 2016-07-21 MED ORDER — ACETAMINOPHEN 500 MG PO TABS: 1000.0000 mg | ORAL_TABLET | Freq: Four times a day (QID) | ORAL | Status: DC | PRN

## 2016-07-21 MED ORDER — QUETIAPINE FUMARATE 200 MG PO TABS
200.0000 mg | ORAL_TABLET | Freq: Every day | ORAL | Status: DC
  Administered 2016-07-21 – 2016-07-24 (×4): 200 mg via ORAL
  Administered 2016-07-25: 100 mg via ORAL
  Filled 2016-07-21 (×5): qty 1

## 2016-07-21 MED ORDER — ENSURE CLEAR PO LIQD: 1.0000 | Freq: Four times a day (QID) | ORAL | Status: DC

## 2016-07-21 MED ORDER — OXYCODONE HCL 10 MG PO TABS: 40.0000 mg | ORAL_TABLET | Freq: Once | ORAL | Status: DC

## 2016-07-21 MED ORDER — ONDANSETRON 4 MG PO TBDP
4.0000 mg | ORAL_TABLET | Freq: Three times a day (TID) | ORAL | Status: DC | PRN
  Administered 2016-07-21 – 2016-07-25 (×9): 4 mg via ORAL
  Filled 2016-07-21 (×9): qty 1

## 2016-07-21 MED ORDER — PANTOPRAZOLE SODIUM 40 MG IV SOLR
40.0000 mg | Freq: Every day | INTRAVENOUS | Status: DC
  Administered 2016-07-21 – 2016-07-22 (×2): 40 mg via INTRAVENOUS
  Filled 2016-07-21 (×2): qty 40

## 2016-07-21 MED ORDER — SODIUM CHLORIDE 0.9% FLUSH
10.0000 mL | Freq: Two times a day (BID) | INTRAVENOUS | Status: DC
  Administered 2016-07-21: 20 mL
  Administered 2016-07-22 – 2016-07-25 (×8): 10 mL

## 2016-07-21 MED ORDER — ALPRAZOLAM 1 MG PO TABS
1.0000 mg | ORAL_TABLET | Freq: Three times a day (TID) | ORAL | Status: DC
  Administered 2016-07-21 – 2016-07-25 (×14): 1 mg via ORAL
  Filled 2016-07-21 (×7): qty 2
  Filled 2016-07-21: qty 1
  Filled 2016-07-21 (×6): qty 2

## 2016-07-21 MED ORDER — ENSURE ENLIVE PO LIQD
237.0000 mL | Freq: Four times a day (QID) | ORAL | Status: DC
  Administered 2016-07-21 – 2016-07-22 (×3): 237 mL via ORAL

## 2016-07-21 MED ORDER — SODIUM CHLORIDE 0.9% FLUSH
10.0000 mL | INTRAVENOUS | Status: DC | PRN
  Administered 2016-07-23: 10 mL
  Filled 2016-07-21: qty 40

## 2016-07-21 MED ORDER — POLYETHYLENE GLYCOL 3350 17 G PO PACK: 17.0000 g | PACK | Freq: Every day | ORAL | Status: DC | PRN

## 2016-07-21 MED ORDER — OXYCODONE-ACETAMINOPHEN 10-325 MG PO TABS: 1.0000 | ORAL_TABLET | ORAL | Status: DC | PRN

## 2016-07-21 MED ORDER — SODIUM CHLORIDE 0.9 % IV SOLN: 500.0000 mL | INTRAVENOUS | Status: DC

## 2016-07-21 MED ORDER — OXYCODONE HCL 5 MG PO TABS
5.0000 mg | ORAL_TABLET | ORAL | Status: DC | PRN
  Administered 2016-07-21 – 2016-07-23 (×10): 5 mg via ORAL
  Filled 2016-07-21 (×10): qty 1

## 2016-07-21 NOTE — H&P (Addendum)
History and Physical    Angie [name] HGD:[id number] DOB: 1965-10-04 DOA: 07/21/2016  PCP: Ricke Hey, MD  Patient coming from: Gastroenterology  Chief Complaint: Abdominal pain  HPI: Angie [name] is a 51 y.o. 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 But he was concerned about her weight loss. As she is cachectic her weight on 07/20/2014 was 74 kg on the day of admission she is 48.5. And he recommended admission for evaluation and a nutritional consult.   Review of Systems: As per HPI otherwise 10 point review of systems negative.    Past Medical History:  Diagnosis Date  . Abnormal Pap smear   . Alcohol abuse   . Anemia    IDA  . Anxiety    Takes xanax  . Arthritis    "qwhere" (04/26/2016)  . Bipolar disorder ([place])   . Blood transfusion without reported diagnosis   . Bulging lumbar disc   . Chronic lower back pain   . Depression   . Fibromyalgia   . Headache    "weekly @ least" (04/26/2016)  . Hypertension   . Lactose intolerance in adult 2017  . Migraines    "weekly @ least" (04/26/2016)  . Seizures ([place])    last one Dec. 2017  . Type II diabetes mellitus ([place])    "before the gastric bypass" (04/26/2016)    Past Surgical History:  Procedure Laterality Date  . BALLOON DILATION N/A 05/27/2016   Procedure: BALLOON DILATION;  Surgeon: Doran Stabler, MD;  Location: [facility];  Service: Gastroenterology;  Laterality: N/A;  . CERVICAL CONE BIOPSY    . [place]; 1996; 1998; 2014  . CESAREAN SECTION WITH BILATERAL TUBAL LIGATION Bilateral 10/28/2012   Procedure: Repeat cesarean section with delivery of baby boy at 108. Apgars 8/9.  BILATERAL TUBAL LIGATION;  Surgeon: Florian Buff, MD;  Location: [facility];  Service: Obstetrics;  Laterality: Bilateral;  . DILATION AND CURETTAGE OF UTERUS    . ESOPHAGOGASTRODUODENOSCOPY N/A 05/27/2016   Procedure: ESOPHAGOGASTRODUODENOSCOPY (EGD);  Surgeon: Doran Stabler, MD;  Location: [facility];  Service: Gastroenterology;  Laterality: N/A;  . ESOPHAGOGASTRODUODENOSCOPY (EGD) WITH PROPOFOL N/A 04/23/2016   Procedure: ESOPHAGOGASTRODUODENOSCOPY (EGD) WITH PROPOFOL;  Surgeon: Doran Stabler, MD;  Location: [facility];  Service: Endoscopy;  Laterality: N/A;  . ROUX-EN-Y GASTRIC BYPASS  2007  . TUBAL LIGATION  2014     reports that she quit smoking about 4 years ago. Her smoking use included Cigarettes. She has a 2.00 pack-year smoking history. She has never used smokeless tobacco. She reports that she drinks about 10.2 oz of alcohol per week . She reports that she does not use drugs.  Allergies  Allergen Reactions  . Iron     Had acute allergy with tachycardia, attention and generalized itching shortly after receiving nulecit ( iron gluconate) infusion.    Family History  Problem Relation Age of Onset  . Diabetes Father   . Heart disease Father   . Depression Maternal Grandmother   . Heart disease Maternal Grandfather   . Depression Paternal Grandmother   .  Colon cancer Paternal Grandfather   . Pancreatic cancer Paternal Grandfather      Prior to Admission medications   Medication Sig Start Date End Date Taking? Authorizing Provider  acetaminophen (TYLENOL) 500 MG tablet Take 1,000 mg by mouth every 6 (six) hours as needed (pain).     Historical Provider, MD  ALPRAZolam Duanne Moron) 1 MG tablet Take 1 mg by mouth 3 (three) times daily.  04/28/13   Historical Provider, MD  hydrOXYzine (VISTARIL) 25 MG capsule Take 25 mg by mouth 3 (three) times daily. 07/01/16   Historical Provider, MD  ibuprofen  (ADVIL,MOTRIN) 200 MG tablet Take 200 mg by mouth every 6 (six) hours as needed.    Historical Provider, MD  lidocaine (XYLOCAINE) 5 % ointment Apply 1 application topically 3 (three) times daily as needed for pain. 05/11/16   Historical Provider, MD  Nutritional Supplements (ENSURE CLEAR) LIQD Take 1 Bottle by mouth 4 (four) times daily. 07/19/16 08/18/16  Nelida Meuse III, MD  omeprazole (PRILOSEC) 20 MG capsule Take 20 mg by mouth daily.    Historical Provider, MD  ondansetron (ZOFRAN ODT) 4 MG disintegrating tablet Take 1 tablet (4 mg total) by mouth every 8 (eight) hours as needed for nausea. 02/12/16   Tanna Furry, MD  Oxycodone HCl 10 MG TABS Take 40 mg by mouth once.    Historical Provider, MD  oxyCODONE-acetaminophen (PERCOCET) 10-325 MG tablet Take 1 tablet by mouth every 4 (four) hours as needed for pain.    Historical Provider, MD  oxymetazoline (AFRIN) 0.05 % nasal spray Place 1 spray into both nostrils 2 (two) times daily as needed for congestion. 02/05/16   Patrecia Pour, MD  pregabalin (LYRICA) 50 MG capsule Take 50 mg by mouth 3 (three) times daily.  05/07/16 06/18/16  Historical Provider, MD  QUEtiapine (SEROQUEL) 200 MG tablet Take 200 mg by mouth at bedtime.  05/07/16 06/18/16  Historical Provider, MD  QUEtiapine (SEROQUEL) 200 MG tablet Take 200 mg by mouth at bedtime. 07/01/16 07/31/16  Historical Provider, MD  Simethicone (GAS-X PO) Take 1 capsule by mouth as needed.    Historical Provider, MD    Physical Exam: Vitals:   07/21/16 1321  BP: (!) 156/114  Pulse: 76  Resp: 16  Temp: 97.6 F (36.4 C)  TempSrc: Oral  SpO2: 100%  Weight: 46 kg (101 lb 6.4 oz)  Height: 5' (1.524 m)    Constitutional: NAD, calm, comfortable Vitals:   07/21/16 1321  BP: (!) 156/114  Pulse: 76  Resp: 16  Temp: 97.6 F (36.4 C)  TempSrc: Oral  SpO2: 100%  Weight: 46 kg (101 lb 6.4 oz)  Height: 5' (1.524 m)   Eyes: PERRL, lids and conjunctivae normal ENMT: Mucous membranes are moist. Posterior  pharynx clear of any exudate or lesions.Normal dentition.  Neck: normal, supple, no masses, no thyromegaly Respiratory: clear to auscultation bilaterally, no wheezing, no crackles. Normal respiratory effort. No accessory muscle use.  Cardiovascular: Regular rate and rhythm, no murmurs / rubs / gallops. No extremity edema. 2+ pedal pulses. No carotid bruits.  Abdomen: Some mild epigastric tenderness no rebound or guarding, no masses no hepatomegaly positive bowel sounds. Musculoskeletal: no clubbing / cyanosis. No joint deformity upper and lower extremities. Good ROM, no contractures. Normal muscle tone.  Skin: no rashes, lesions, ulcers. No induration Neurologic: CN 2-12 grossly intact. Sensation intact, DTR normal. Strength 5/5 in all 4.  Psychiatric: Normal judgment and insight. Alert and oriented x 3. Normal mood.  Labs on Admission: I have personally reviewed following labs and imaging studies  CBC: No results for input(s): WBC, NEUTROABS, HGB, HCT, MCV, PLT in the last 168 hours. Basic Metabolic Panel: No results for input(s): NA, K, CL, CO2, GLUCOSE, BUN, CREATININE, CALCIUM, MG, PHOS in the last 168 hours. GFR: CrCl cannot be calculated (Patient's most recent lab result is older than the maximum 21 days allowed.). Liver Function Tests: No results for input(s): AST, ALT, ALKPHOS, BILITOT, PROT, ALBUMIN in the last 168 hours. No results for input(s): LIPASE, AMYLASE in the last 168 hours. No results for input(s): AMMONIA in the last 168 hours. Coagulation Profile: No results for input(s): INR, PROTIME in the last 168 hours. Cardiac Enzymes: No results for input(s): CKTOTAL, CKMB, CKMBINDEX, TROPONINI in the last 168 hours. BNP (last 3 results) No results for input(s): PROBNP in the last 8760 hours. HbA1C: No results for input(s): HGBA1C in the last 72 hours. CBG: No results for input(s): GLUCAP in the last 168 hours. Lipid Profile: No results for input(s): CHOL, HDL,  LDLCALC, TRIG, CHOLHDL, LDLDIRECT in the last 72 hours. Thyroid Function Tests: No results for input(s): TSH, T4TOTAL, FREET4, T3FREE, THYROIDAB in the last 72 hours. Anemia Panel: No results for input(s): VITAMINB12, FOLATE, FERRITIN, TIBC, IRON, RETICCTPCT in the last 72 hours. Urine analysis:    Component Value Date/Time   COLORURINE COLORLESS (A) 04/22/2016 0038   APPEARANCEUR CLEAR 04/22/2016 0038   LABSPEC 1.004 (L) 04/22/2016 0038   PHURINE 7.0 04/22/2016 0038   GLUCOSEU 50 (A) 04/22/2016 0038   HGBUR NEGATIVE 04/22/2016 0038   BILIRUBINUR NEGATIVE 04/22/2016 0038   KETONESUR NEGATIVE 04/22/2016 0038   PROTEINUR NEGATIVE 04/22/2016 0038   UROBILINOGEN 0.2 10/26/2012 1115   NITRITE NEGATIVE 04/22/2016 0038   LEUKOCYTESUR NEGATIVE 04/22/2016 0038    Radiological Exams on Admission: No results found.  EKG: Independently reviewed. None  Assessment/Plan Anastomotic ulcer: She denies any melanotic stools black stools, bright red blood per rectum, this probably due to NSAID use. Her hemoglobin on 04/24/2016 was 10.2, check a CBC. We'll start on IV Protonix. Check a basic metabolic panel. She had a previous CT scan on December 2017 that show mild distention of the colon with some wall thickening and inflammatory changes, according to her gastroenterologist her opening at her stricture is sufficient that she should be able to tolerate a liquid diet.  Gastrojejunal anastomotic stricture  Abdominal pain, chronic, epigastric/  Protein-calorie malnutrition, severe: Probably anastomotic stricture could be contracting to her pain, nutritional consult to assess nutrition and recommend diet. The central urology recommended that she can tolerate a liquid diet, I will add ensure 3 times a day. Continue narcotics for pain, avoid NSAIDs. She has an appointment with a surgeon as an outpatient for her stricture on 08/05/2016. Insert PICC line start TNA  Chronic pain syndrome: Continue  narcotics for pain.    DVT prophylaxis: SCD Code Status: full Family Communication: none Consults called: None Admission status: Inpatient   Charlynne Cousins MD [facility] Pager [phone]  If 7PM-7AM, please contact night-coverage [email or web address] Password [email or web address]  07/21/2016, 1:42 PM

## 2016-07-21 NOTE — Progress Notes (Signed)
A and O x3. Report to RN. Tolerated MAC anesthesia well.Gums unchanged after procedure. 

## 2016-07-21 NOTE — Progress Notes (Signed)
Patient came in today for an esophagogastroduodenoscopy and was found to have an ulcerated anastomosis site. Dr. [name] decided to admit her to [facility] under the hospitalist care. I spoke to the receiving nurse, Legrand Rams, RN on [address] and gave her report. I also called [facility] and spoke to her counselor, Ranelle Oyster and let her know that the patient would not be coming back to them today.

## 2016-07-21 NOTE — Progress Notes (Signed)
Peripherally Inserted Central Catheter/Midline Placement  The IV Nurse has discussed with the patient and/or persons authorized to consent for the patient, the purpose of this procedure and the potential benefits and risks involved with this procedure.  The benefits include less needle sticks, lab draws from the catheter, and the patient may be discharged home with the catheter. Risks include, but not limited to, infection, bleeding, blood clot (thrombus formation), and puncture of an artery; nerve damage and irregular heartbeat and possibility to perform a PICC exchange if needed/ordered by physician.  Alternatives to this procedure were also discussed.  B[facility] Power PICC patient education guide, fact sheet on infection prevention and patient information card has been provided to patient /or left at bedside.    PICC/Midline Placement Documentation  PICC Double Lumen 07/21/16 PICC Right Brachial 35 cm 0 cm (Active)  Indication for Insertion or Continuance of Line Administration of hyperosmolar/irritating solutions (i.e. TPN, Vancomycin, etc.) 07/21/2016  9:58 PM  Exposed Catheter (cm) 0 cm 07/21/2016  9:58 PM  Site Assessment Clean;Dry;Intact 07/21/2016  9:58 PM  Lumen #1 Status Flushed;Saline locked;Blood return noted 07/21/2016  9:58 PM  Lumen #2 Status Flushed;Saline locked;Blood return noted 07/21/2016  9:58 PM  Dressing Type Transparent 07/21/2016  9:58 PM  Dressing Status Clean;Dry;Intact 07/21/2016  9:58 PM  Dressing Change Due 07/28/16 07/21/2016  9:58 PM       Gordan Payment 07/21/2016, 9:59 PM

## 2016-07-21 NOTE — Progress Notes (Signed)
PHARMACY - ADULT TOTAL PARENTERAL NUTRITION CONSULT NOTE   Pharmacy Consult for TPN Indication: malnutrition  Patient Measurements: Height: 5' (152.4 cm) Weight: 101 lb 6.4 oz (46 kg) IBW/kg (Calculated) : 45.5 TPN AdjBW (KG): 46 Body mass index is 19.8 kg/m. Usual Weight: ~70-75kg  Insulin Requirements: none  Current Nutrition: FLD, Ensure 4x/day  IVF: NS @ 196ml/hr  Central access:  TPN start date:   ASSESSMENT                                                                                                          HPI: 51 yo F with hx gastric bypass & chronic gastrojejunal stricture refractory to serial dilations and leading to malnutrition / significant weight weight loss over last 6 months.   Surgery & dietician consults pending.   Significant events:   Today:   Glucose - at goal, hx of DM prior to gastric bypass surgery  Electrolytes - WNL, check Mag, Phos in am  Renal - at baseline  LFTs - WNL  TGs - ordered for 3/29  Prealbumin - ordered for 3/29  NUTRITIONAL GOALS                                                                                             RD recs: pending  PLAN                                                                                                                          F/U labs, PICC line placement to start [place], Lavonia Drafts 07/21/2016,2:58 PM

## 2016-07-21 NOTE — Op Note (Signed)
[name] Procedure Date: 07/21/2016 9:42 AM MRN: [id number] Endoscopist: Pettit. [name] , MD Age: 51 Referring MD:  Date of Birth: [birth year] Gender: Female Account #: [id number] Procedure:                Upper GI endoscopy Indications:              Epigastric abdominal pain, Vomiting, Weight loss Medicines:                Monitored Anesthesia Care Procedure:                Pre-Anesthesia Assessment:                           - Prior to the procedure, a History and Physical                            was performed, and patient medications and                            allergies were reviewed. The patient's tolerance of                            previous anesthesia was also reviewed. The risks                            and benefits of the procedure and the sedation                            options and risks were discussed with the patient.                            All questions were answered, and informed consent                            was obtained. Prior Anticoagulants: The patient has                            taken no previous anticoagulant or antiplatelet                            agents. ASA Grade Assessment: III - A patient with                            severe systemic disease. After reviewing the risks                            and benefits, the patient was deemed in                            satisfactory condition to undergo the procedure.                           After obtaining informed consent, the endoscope was  passed under direct vision. Throughout the                            procedure, the patient's blood pressure, pulse, and                            oxygen saturations were monitored continuously. The                            Endoscope was introduced through the mouth, and                            advanced to the jejunum. The upper GI endoscopy was   accomplished without difficulty. The patient                            tolerated the procedure well. Scope In: Scope Out: Findings:                 The esophagus was normal.                           Evidence of a Roux-en-Y gastrojejunostomy was                            found. The gastrojejunal anastomosis was                            characterized by moderate stenosis and ulceration                            with inflammation. Biopsies were taken with a cold                            forceps for histology to rule out malignancy. The                            anastomosis diameter was 54mm, allowing the scope to                            pass with mild resistance.                           No balloon dilatation was performed due to the                            existing anastomotic diameter and the degree of                            inflammation/ulceration.                           The examined jejunum was normal. Complications:            No immediate complications. Estimated Blood Loss:     Estimated blood loss was minimal. Impression:               -  Normal esophagus.                           - Roux-en-Y gastrojejunostomy with gastrojejunal                            anastomosis characterized by ulceration and                            moderate stenosis. Biopsied.                           - Normal examined jejunum. Recommendation:           - Patient has a contact number available for                            emergencies. The signs and symptoms of potential                            delayed complications were discussed with the                            patient. Return to normal activities tomorrow.                            Written discharge instructions were provided to the                            patient.                           - Pureed diet.                           - Continue present medications.                           - No aspirin, ibuprofen,  naproxen, or other                            non-steroidal anti-inflammatory drugs.                           - Await pathology results.                           - Patient will be electively admitted to the                            hospital internal medicine service for control of                            worsening epigastric and mid back pain, inability                            to tolerate oral nutrition and worsening severe  protein-calorie malnutrition.                           Ongoing substance abuse and NSAID use are                            exacerbating this complicated clinical picture. [name] L. [name], MD 07/21/2016 10:57:25 AM This report has been signed electronically.

## 2016-07-21 NOTE — Consult Note (Signed)
Referring Provider: [facility]  Primary Care Physician:  Ricke Hey, MD Primary Gastroenterologist:  Wilfrid Lund,  MD  Reason for Consultation:  Gastrojejunal stricture  ASSESSMENT AND PLAN:   63. 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   -She will need a PICC for TNA -IV PPI  -I spoke with Dr. Barry Dienes from [place]. Will be happy to see but not likely to do anything surgically right now. Recommends TNA and outpatient evaluation with one of the bariatric surgeons at [facility].  -liquid diet  2. Chronic iron deficiency anemia likely secondary to gastric bypass. She had a significant reaction to IV iron. We referred her to Hematologist to help with iron replacement but she was unable to make the appointment. -labs pending  3. Hx of ETOH abuse. She went through detox in March.    HPI: Angie [name] is a 51 y.o. female who is s/p gastric bypass in 2007 for weight loss. We saw her for the first time in the hospital in December. She was admitted at that time for a seizure. We saw her for vomiting and weight loss. nausea, EGD revealed a severely ulcerated and stenotic gastrojejunal anastomosis, s/  dillatio. Repeat EGD 2/1, ulcerated and severely stenosed anastomosis which we again dilated. was again dilated. A follow up EGD was scheduled for today which again showed same ulcerated and stenotic anastomosis which was biopsied but not dilated. Post procedure patient sent for admission for treatment of malnutrition. She has been having solid food dysphagia. It improved a lot following the first dilation in December but less beneficial with each subsequent dilation.  No vomiting except for one episode last week.   Patient has chronic generalized abdominal pain. She gives a history of fibromyalgia and everything hurts. The mid lower abdominal pain is worse after meals. Sometimes  having a BM helps. Her BMs are normal now but she had loose stools last week.  No blood in stool.     Past Medical History:  Diagnosis Date  . Abnormal Pap smear   . Alcohol abuse   . Anemia    IDA  . Anxiety    Takes xanax  . Arthritis    "qwhere" (04/26/2016)  . Bipolar disorder ([place])   . Blood transfusion without reported diagnosis   . Bulging lumbar disc   . Chronic lower back pain   . Depression   . Fibromyalgia   . Headache    "weekly @ least" (04/26/2016)  . Hypertension   . Lactose intolerance in adult 2017  . Migraines    "weekly @ least" (04/26/2016)  . Seizures ([place])    last one Dec. 2017  . Type II diabetes mellitus ([place])    "before the gastric bypass" (04/26/2016)    Past Surgical History:  Procedure Laterality Date  . BALLOON DILATION N/A 05/27/2016   Procedure: BALLOON DILATION;  Surgeon: Doran Stabler, MD;  Location: [facility];  Service: Gastroenterology;  Laterality: N/A;  . CERVICAL CONE BIOPSY    . [place]; 1996; 1998; 2014  . CESAREAN SECTION WITH BILATERAL TUBAL LIGATION Bilateral 10/28/2012   Procedure: Repeat cesarean section with delivery of baby boy at 27. Apgars 8/9.  BILATERAL TUBAL LIGATION;  Surgeon: Florian Buff, MD;  Location: [facility];  Service: Obstetrics;  Laterality: Bilateral;  . DILATION AND CURETTAGE OF UTERUS    . ESOPHAGOGASTRODUODENOSCOPY N/A 05/27/2016  Procedure: ESOPHAGOGASTRODUODENOSCOPY (EGD);  Surgeon: Doran Stabler, MD;  Location: [facility];  Service: Gastroenterology;  Laterality: N/A;  . ESOPHAGOGASTRODUODENOSCOPY (EGD) WITH PROPOFOL N/A 04/23/2016   Procedure: ESOPHAGOGASTRODUODENOSCOPY (EGD) WITH PROPOFOL;  Surgeon: Doran Stabler, MD;  Location: [facility];  Service: Endoscopy;  Laterality: N/A;  . ROUX-EN-Y GASTRIC BYPASS  2007  . TUBAL LIGATION  2014    Prior to Admission medications   Medication Sig Start Date End Date Taking? Authorizing Provider  acetaminophen (TYLENOL) 500 MG tablet Take  1,000 mg by mouth every 6 (six) hours as needed (pain).     Historical Provider, MD  ALPRAZolam Duanne Moron) 1 MG tablet Take 1 mg by mouth 3 (three) times daily.  04/28/13   Historical Provider, MD  hydrOXYzine (VISTARIL) 25 MG capsule Take 25 mg by mouth 3 (three) times daily. 07/01/16   Historical Provider, MD  ibuprofen (ADVIL,MOTRIN) 200 MG tablet Take 200 mg by mouth every 6 (six) hours as needed.    Historical Provider, MD  lidocaine (XYLOCAINE) 5 % ointment Apply 1 application topically 3 (three) times daily as needed for pain. 05/11/16   Historical Provider, MD  Nutritional Supplements (ENSURE CLEAR) LIQD Take 1 Bottle by mouth 4 (four) times daily. 07/19/16 08/18/16  Nelida Meuse III, MD  omeprazole (PRILOSEC) 20 MG capsule Take 20 mg by mouth daily.    Historical Provider, MD  ondansetron (ZOFRAN ODT) 4 MG disintegrating tablet Take 1 tablet (4 mg total) by mouth every 8 (eight) hours as needed for nausea. 02/12/16   Tanna Furry, MD  Oxycodone HCl 10 MG TABS Take 40 mg by mouth once.    Historical Provider, MD  oxyCODONE-acetaminophen (PERCOCET) 10-325 MG tablet Take 1 tablet by mouth every 4 (four) hours as needed for pain.    Historical Provider, MD  oxymetazoline (AFRIN) 0.05 % nasal spray Place 1 spray into both nostrils 2 (two) times daily as needed for congestion. 02/05/16   Patrecia Pour, MD  pregabalin (LYRICA) 50 MG capsule Take 50 mg by mouth 3 (three) times daily.  05/07/16 06/18/16  Historical Provider, MD  QUEtiapine (SEROQUEL) 200 MG tablet Take 200 mg by mouth at bedtime.  05/07/16 06/18/16  Historical Provider, MD  QUEtiapine (SEROQUEL) 200 MG tablet Take 200 mg by mouth at bedtime. 07/01/16 07/31/16  Historical Provider, MD  Simethicone (GAS-X PO) Take 1 capsule by mouth as needed.    Historical Provider, MD    Current Facility-Administered Medications  Medication Dose Route Frequency Provider Last Rate Last Dose  . 0.9 %  sodium chloride infusion   Intravenous Continuous Charlynne Cousins, MD      . acetaminophen (TYLENOL) tablet 1,000 mg  1,000 mg Oral Q6H PRN Charlynne Cousins, MD      . ALPRAZolam Duanne Moron) tablet 1 mg  1 mg Oral TID Charlynne Cousins, MD      . ENSURE CLEAR LIQD 1 Bottle  1 Bottle Oral QID Charlynne Cousins, MD      . ondansetron (ZOFRAN-ODT) disintegrating tablet 4 mg  4 mg Oral Q8H PRN Charlynne Cousins, MD      . Oxycodone HCl TABS 40 mg  40 mg Oral Once Charlynne Cousins, MD      . oxyCODONE-acetaminophen (PERCOCET) 10-325 MG per tablet 1 tablet  1 tablet Oral Q4H PRN Charlynne Cousins, MD      . polyethylene glycol (MIRALAX / GLYCOLAX) packet 17 g  17 g Oral Daily PRN Tammi Klippel  Aileen Fass, MD      . QUEtiapine (SEROQUEL) tablet 200 mg  200 mg Oral QHS Charlynne Cousins, MD        Allergies as of 07/21/2016 - Review Complete 07/21/2016  Allergen Reaction Noted  . Iron  04/24/2016    Family History  Problem Relation Age of Onset  . Diabetes Father   . Heart disease Father   . Depression Maternal Grandmother   . Heart disease Maternal Grandfather   . Depression Paternal Grandmother   . Colon cancer Paternal Grandfather   . Pancreatic cancer Paternal Grandfather     Social History   Social History  . Marital status: Widowed    Spouse name: N/A  . Number of children: N/A  . Years of education: N/A   Occupational History  . Not on file.   Social History Main Topics  . Smoking status: Former Smoker    Packs/day: 0.50    Years: 4.00    Types: Cigarettes    Quit date: 03/16/2012  . Smokeless tobacco: Never Used  . Alcohol use 10.2 oz/week    6 Glasses of wine, 11 Shots of liquor per week     Comment: current;y in treatment for alcohol abuse; no use at this time  . Drug use: No  . Sexual activity: Yes    Birth control/ protection: None   Other Topics Concern  . Not on file   Social History Narrative  . No narrative on file    Review of Systems: All systems reviewed and negative except where noted in  HPI.  Physical Exam: Vital signs in last 24 hours: Temp:  [97.6 F (36.4 C)-98.7 F (37.1 C)] 97.6 F (36.4 C) (03/28 1321) Pulse Rate:  [67-92] 76 (03/28 1321) Resp:  [12-19] 16 (03/28 1321) BP: (154-196)/(98-130) 156/114 (03/28 1321) SpO2:  [95 %-100 %] 100 % (03/28 1321) Weight:  [101 lb 6.4 oz (46 kg)-107 lb (48.5 kg)] 101 lb 6.4 oz (46 kg) (03/28 1321)   General:   Alert, very thin black female in NAD Eyes:  Sclera clear, no icterus.   Conjunctiva pink. Ears:  Normal auditory acuity. Nose:  No deformity, discharge,  or lesions. Mouth:  No deformity or lesions.   Neck:  Supple; no masses Lungs:  Clear throughout to auscultation.   No wheezes, crackles, or rhonchi.  Heart:  Regular rate and rhythm; no edema. Abdomen:  Soft,nontender, BS active,no palp mass.   Rectal:  Deferred  Msk:  Symmetrical without gross deformities. . Extremities:  Without clubbing or edema. Neurologic:  Alert and  oriented x4;  grossly normal neurologically. Skin:  Intact without significant lesions or rashes.. Psych:  Alert and cooperative. Normal mood and affect.  Intake/Output from previous day: No intake/output data recorded. Intake/Output this shift: No intake/output data recorded.  Lab Results:  GETTING LABS NOW No results for input(s): WBC, HGB, HCT, PLT in the last 72 hours. BMET No results for input(s): NA, K, CL, CO2, GLUCOSE, BUN, CREATININE, CALCIUM in the last 72 hours. LFT No results for input(s): PROT, ALBUMIN, AST, ALT, ALKPHOS, BILITOT, BILIDIR, IBILI in the last 72 hours. PT/INR No results for input(s): LABPROT, INR in the last 72 hours. Hepatitis Panel No results for input(s): HEPBSAG, HCVAB, HEPAIGM, HEPBIGM in the last 72 hours.   Studies/Results: No results found.  Tye Savoy, NP-C @  07/21/2016, 1:53 PM  Pager number [phone]

## 2016-07-21 NOTE — Progress Notes (Signed)
There were no vitals taken for this visit. There were no vitals filed for this visit. 50 year old female with past medical history of alcohol abuse, polysubstance abuse, also history of gastric bypass with stricture status post multiple dilations with ongoing NSAID use that went to the gastroenterologist office today an EGD was performed that showed a stricture within ulcer at the anastomosis probably due to NSAIDs. She continues to complain of abdominal pain. According to Dr. Kizzie Ide who is her gastroenterologist she is significantly emancipated, her weight on 07/20/2014 was 74 kg, today she is 48.5 kg.  She'll probably need a nutrition consult, started on IV Protonix for convex for pain avoid NSAIDs. He relates she should be able to tolerate liquids with her degree of anastomosis. And probably a surgical consult.

## 2016-07-22 ENCOUNTER — Telehealth: Payer: Self-pay | Admitting: *Deleted

## 2016-07-22 DIAGNOSIS — T39395A Adverse effect of other nonsteroidal anti-inflammatory drugs [NSAID], initial encounter: Secondary | ICD-10-CM | POA: Diagnosis present

## 2016-07-22 DIAGNOSIS — E43 Unspecified severe protein-calorie malnutrition: Secondary | ICD-10-CM

## 2016-07-22 DIAGNOSIS — E119 Type 2 diabetes mellitus without complications: Secondary | ICD-10-CM | POA: Diagnosis present

## 2016-07-22 DIAGNOSIS — F419 Anxiety disorder, unspecified: Secondary | ICD-10-CM | POA: Diagnosis present

## 2016-07-22 DIAGNOSIS — Z681 Body mass index (BMI) 19 or less, adult: Secondary | ICD-10-CM | POA: Diagnosis not present

## 2016-07-22 DIAGNOSIS — R569 Unspecified convulsions: Secondary | ICD-10-CM | POA: Diagnosis present

## 2016-07-22 DIAGNOSIS — F319 Bipolar disorder, unspecified: Secondary | ICD-10-CM | POA: Diagnosis present

## 2016-07-22 DIAGNOSIS — F1011 Alcohol abuse, in remission: Secondary | ICD-10-CM | POA: Diagnosis present

## 2016-07-22 DIAGNOSIS — G894 Chronic pain syndrome: Secondary | ICD-10-CM | POA: Diagnosis present

## 2016-07-22 DIAGNOSIS — I10 Essential (primary) hypertension: Secondary | ICD-10-CM | POA: Diagnosis present

## 2016-07-22 DIAGNOSIS — M797 Fibromyalgia: Secondary | ICD-10-CM | POA: Diagnosis present

## 2016-07-22 DIAGNOSIS — E739 Lactose intolerance, unspecified: Secondary | ICD-10-CM | POA: Diagnosis present

## 2016-07-22 DIAGNOSIS — R1084 Generalized abdominal pain: Secondary | ICD-10-CM

## 2016-07-22 DIAGNOSIS — Z9851 Tubal ligation status: Secondary | ICD-10-CM | POA: Diagnosis not present

## 2016-07-22 DIAGNOSIS — K9289 Other specified diseases of the digestive system: Secondary | ICD-10-CM | POA: Diagnosis present

## 2016-07-22 DIAGNOSIS — R109 Unspecified abdominal pain: Secondary | ICD-10-CM | POA: Diagnosis present

## 2016-07-22 DIAGNOSIS — Z9884 Bariatric surgery status: Secondary | ICD-10-CM | POA: Diagnosis not present

## 2016-07-22 DIAGNOSIS — R1310 Dysphagia, unspecified: Secondary | ICD-10-CM | POA: Diagnosis present

## 2016-07-22 DIAGNOSIS — R64 Cachexia: Secondary | ICD-10-CM | POA: Diagnosis present

## 2016-07-22 DIAGNOSIS — Z87891 Personal history of nicotine dependence: Secondary | ICD-10-CM | POA: Diagnosis not present

## 2016-07-22 DIAGNOSIS — M545 Low back pain: Secondary | ICD-10-CM | POA: Diagnosis present

## 2016-07-22 DIAGNOSIS — K9189 Other postprocedural complications and disorders of digestive system: Secondary | ICD-10-CM | POA: Diagnosis not present

## 2016-07-22 DIAGNOSIS — Z818 Family history of other mental and behavioral disorders: Secondary | ICD-10-CM | POA: Diagnosis not present

## 2016-07-22 DIAGNOSIS — K289 Gastrojejunal ulcer, unspecified as acute or chronic, without hemorrhage or perforation: Secondary | ICD-10-CM | POA: Diagnosis present

## 2016-07-22 DIAGNOSIS — D509 Iron deficiency anemia, unspecified: Secondary | ICD-10-CM | POA: Diagnosis present

## 2016-07-22 DIAGNOSIS — E876 Hypokalemia: Secondary | ICD-10-CM | POA: Diagnosis present

## 2016-07-22 DIAGNOSIS — G8929 Other chronic pain: Secondary | ICD-10-CM | POA: Diagnosis not present

## 2016-07-22 DIAGNOSIS — R1013 Epigastric pain: Secondary | ICD-10-CM | POA: Diagnosis present

## 2016-07-22 LAB — TRIGLYCERIDES: Triglycerides: 106 mg/dL (ref <150)

## 2016-07-22 LAB — COMPREHENSIVE METABOLIC PANEL
ALT: 11 U/L — ABNORMAL LOW (ref 14–54)
AST: 21 U/L (ref 15–41)
Albumin: 3.2 g/dL — ABNORMAL LOW (ref 3.5–5.0)
Alkaline Phosphatase: 59 U/L (ref 38–126)
Anion gap: 9 (ref 5–15)
BUN: 6 mg/dL (ref 6–20)
CO2: 17 mmol/L — ABNORMAL LOW (ref 22–32)
Calcium: 8.5 mg/dL — ABNORMAL LOW (ref 8.9–10.3)
Chloride: 115 mmol/L — ABNORMAL HIGH (ref 101–111)
Creatinine, Ser: 0.63 mg/dL (ref 0.44–1.00)
GFR calc Af Amer: >60 mL/min (ref >60)
GFR calc non Af Amer: >60 mL/min (ref >60)
Glucose, Bld: 181 mg/dL — ABNORMAL HIGH (ref 65–99)
Potassium: 3 mmol/L — ABNORMAL LOW (ref 3.5–5.1)
Sodium: 141 mmol/L (ref 135–145)
Total Bilirubin: 0.4 mg/dL (ref 0.3–1.2)
Total Protein: 6.2 g/dL — ABNORMAL LOW (ref 6.5–8.1)

## 2016-07-22 LAB — PREALBUMIN: Prealbumin: 12.2 mg/dL — ABNORMAL LOW (ref 18–38)

## 2016-07-22 LAB — DIFFERENTIAL
Basophils Absolute: 0 10*3/uL (ref 0.0–0.1)
Basophils Relative: 1 %
Eosinophils Absolute: 0.1 10*3/uL (ref 0.0–0.7)
Eosinophils Relative: 1 %
Lymphocytes Relative: 44 %
Lymphs Abs: 1.9 10*3/uL (ref 0.7–4.0)
Monocytes Absolute: 0.3 10*3/uL (ref 0.1–1.0)
Monocytes Relative: 6 %
Neutro Abs: 2.2 10*3/uL (ref 1.7–7.7)
Neutrophils Relative %: 48 %

## 2016-07-22 LAB — GLUCOSE, CAPILLARY
Glucose-Capillary: 100 mg/dL — ABNORMAL HIGH (ref 65–99)
Glucose-Capillary: 59 mg/dL — ABNORMAL LOW (ref 65–99)
Glucose-Capillary: 87 mg/dL (ref 65–99)

## 2016-07-22 LAB — CBC
HCT: 27.9 % — ABNORMAL LOW (ref 36.0–46.0)
Hemoglobin: 9.5 g/dL — ABNORMAL LOW (ref 12.0–15.0)
MCH: 31.7 pg (ref 26.0–34.0)
MCHC: 34.1 g/dL (ref 30.0–36.0)
MCV: 93 fL (ref 78.0–100.0)
Platelets: 400 10*3/uL (ref 150–400)
RBC: 3 MIL/uL — ABNORMAL LOW (ref 3.87–5.11)
RDW: 12.3 % (ref 11.5–15.5)
WBC: 4.4 10*3/uL (ref 4.0–10.5)

## 2016-07-22 LAB — PHOSPHORUS: Phosphorus: 4.1 mg/dL (ref 2.5–4.6)

## 2016-07-22 LAB — MAGNESIUM: Magnesium: 1.6 mg/dL — ABNORMAL LOW (ref 1.7–2.4)

## 2016-07-22 MED ORDER — UNJURY CHICKEN SOUP POWDER
8.0000 [oz_av] | Freq: Two times a day (BID) | ORAL | Status: DC
  Administered 2016-07-22 – 2016-07-23 (×2): 8 [oz_av] via ORAL
  Filled 2016-07-22 (×3): qty 27

## 2016-07-22 MED ORDER — POTASSIUM CHLORIDE 10 MEQ/100ML IV SOLN
10.0000 meq | INTRAVENOUS | Status: AC
  Administered 2016-07-22 (×6): 10 meq via INTRAVENOUS
  Filled 2016-07-22 (×6): qty 100

## 2016-07-22 MED ORDER — FAT EMULSION 20 % IV EMUL
250.0000 mL | INTRAVENOUS | Status: AC
  Administered 2016-07-22: 250 mL via INTRAVENOUS
  Filled 2016-07-22: qty 250

## 2016-07-22 MED ORDER — INSULIN ASPART 100 UNIT/ML ~~LOC~~ SOLN: 0.0000 [IU] | Freq: Four times a day (QID) | SUBCUTANEOUS | Status: DC

## 2016-07-22 MED ORDER — MAGNESIUM SULFATE 2 GM/50ML IV SOLN
2.0000 g | Freq: Once | INTRAVENOUS | Status: AC
  Administered 2016-07-22: 2 g via INTRAVENOUS
  Filled 2016-07-22: qty 50

## 2016-07-22 MED ORDER — BOOST / RESOURCE BREEZE PO LIQD
1.0000 | Freq: Four times a day (QID) | ORAL | Status: DC
  Administered 2016-07-22 – 2016-07-25 (×16): 1 via ORAL
  Filled 2016-07-22: qty 1

## 2016-07-22 MED ORDER — M.V.I. ADULT IV INJ
INTRAVENOUS | Status: AC
  Administered 2016-07-22: 17:00:00 via INTRAVENOUS
  Filled 2016-07-22: qty 720

## 2016-07-22 MED ORDER — LIDOCAINE 5 % EX PTCH
1.0000 | MEDICATED_PATCH | Freq: Every day | CUTANEOUS | Status: DC
  Administered 2016-07-22 – 2016-07-25 (×4): 1 via TRANSDERMAL
  Filled 2016-07-22 (×5): qty 1

## 2016-07-22 MED ORDER — PANTOPRAZOLE SODIUM 40 MG IV SOLR
40.0000 mg | Freq: Two times a day (BID) | INTRAVENOUS | Status: DC
  Administered 2016-07-22 – 2016-07-25 (×7): 40 mg via INTRAVENOUS
  Filled 2016-07-22 (×7): qty 40

## 2016-07-22 NOTE — Progress Notes (Signed)
PHARMACY - ADULT TOTAL PARENTERAL NUTRITION CONSULT NOTE   Pharmacy Consult for TPN Indication: malnutrition  Patient Measurements: Height: 5' (152.4 cm) Weight: 101 lb 6.4 oz (46 kg) IBW/kg (Calculated) : 45.5 TPN AdjBW (KG): 46 Body mass index is 19.8 kg/m. 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  Significant events:   Today:    Glucose - elevated at 181  Electrolytes - K 3.0, Mag 1.6, all others including CCa WNL  Renal - WNL  LFTs - WNL  TGs - 106  Prealbumin - 12.2  NUTRITIONAL GOALS                                                                                             RD recs: pending   PLAN  KCl 74meq IV x 6 Mag sulfate 2gm IV x 1                                                                                                                        At 1800 today:  Start Clinimix E 5/15 at 30 ml/hr.  20% fat emulsion at 41ml/hr over 12 hours  Plan to advance as tolerated to the goal rate.  TPN to contain standard multivitamins.  Will add trace elements on MWF  Add sensitive scale SSI .   BMet with mag and phos tomorrow am  TPN lab panels on Mondays & Thursdays.  F/u daily.   Dolly Rias RPh 07/22/2016, 11:53 AM Pager [phone]

## 2016-07-22 NOTE — Progress Notes (Signed)
     [facility] Progress Note  Chief Complaint:  Malnutrition, hx of gastric bypass with stricture at [place] anastomosis    Subjective: Tolerating liquids. Anti-emetics help the nausea  Objective:  Vital signs in last 24 hours: Temp:  [97.4 F (36.3 C)-97.6 F (36.4 C)] 97.6 F (36.4 C) (03/29 0511) Pulse Rate:  [67-98] 98 (03/29 0511) Resp:  [12-19] 16 (03/29 0511) BP: (112-196)/(71-130) 112/71 (03/29 0511) SpO2:  [95 %-100 %] 100 % (03/29 0511) Weight:  [101 lb 6.4 oz (46 kg)] 101 lb 6.4 oz (46 kg) (03/28 1321) Last BM Date: 07/21/16 General:   Alert, emaciated black female in NAD EENT:  Normal hearing, non icteric sclera, conjunctive pink.  Heart:  Regular rate and rhythm; no murmurs. no lower extremity edema Pulm: Normal respiratory effort Abdomen:  Soft, nondistended, mild diffuse upper tenderness.  Normal bowel sounds, no masses felt.    Neurologic:  Alert and  oriented x4;  grossly normal neurologically. Psych:  Alert and cooperative. Normal mood and affect.   Intake/Output from previous day: 03/28 0701 - 03/29 0700 In: 880 [P.O.:480; I.V.:400] Out: 1400 [Urine:1400] Intake/Output this shift: No intake/output data recorded.  Lab Results:  Recent Labs  07/21/16 1429 07/22/16 0407  WBC 3.9* 4.4  HGB 10.8* 9.5*  HCT 31.4* 27.9*  PLT 484* 400   BMET  Recent Labs  07/21/16 1429 07/22/16 0407  NA 141 141  K 3.5 3.0*  CL 111 115*  CO2 20* 17*  GLUCOSE 72 181*  BUN 10 6  CREATININE 0.57 0.63  CALCIUM 9.2 8.5*   LFT  Recent Labs  07/22/16 0407  PROT 6.2*  ALBUMIN 3.2*  AST 21  ALT 11*  ALKPHOS 59  BILITOT 0.4    Assessment / Plan:  1. 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  -She now has a PICC. TNA to start today.  -Will need to see one of the bariatric surgeons as outpatient  2. Hypokalemia, K+ 3, likely nutritional. Replacement  per pharmacy / primary team.  3. Chronic IDA secondary to gastric bypass. Hgb stable at 9.5. Had adverse reaction to IV iron in December  Active Problems:   Abdominal pain, chronic, epigastric   Protein-calorie malnutrition, severe   Gastrojejunal anastomotic stricture   Chronic pain syndrome   Anastomotic ulcer   Abdominal pain   LOS: 1 day   Tye Savoy NP 07/22/2016, 10:27 AM  Pager number [phone]     Attending physician's note   I have taken an interval history, reviewed the chart and examined the patient. I agree with the Advanced Practitioner's note, impression and recommendations. Malnutrition, weight loss, GJ ulcer, GJ stricture. Starting TNA, increase PPI to bid. Replace Fe. Do not advance diet beyond full liquids and continue liquid nutritional supplements. We will plan to see again on Monday. Please call if needed over holiday weekend.    Lucio Edward, MD [facility] [phone] Mon-Fri 8a-5p [phone] after 5p, weekends, holidays

## 2016-07-22 NOTE — Telephone Encounter (Signed)
Pt was admitted to hospital. 

## 2016-07-22 NOTE — Progress Notes (Signed)
Initial Nutrition Assessment  DOCUMENTATION CODES:   Severe malnutrition in context of chronic illness  INTERVENTION:   Monitor magnesium, potassium, and phosphorus daily for at least 3 days, MD to replete as needed, as pt is at risk for refeeding syndrome given severe malnutrition, poor PO intake PTA, low K and Mg levels.  -TPN per Pharmacy -Calorie Count -Day 1 results to follow in separate note -Provide Boost Breeze po QID, each supplement provides 250 kcal and 9 grams of protein -Provide Unjury chicken Soup BID, Each serving provides 100kcal and 21g protein   Consider Thiamine supplementation, 100 mg daily for at least 3 days given refeeding syndrome risk.  NUTRITION DIAGNOSIS:   Malnutrition (Severe) related to chronic illness (chronic G-J stricture) as evidenced by percent weight loss, energy intake < or equal to 75% for > or equal to 1 month, severe depletion of body fat, severe depletion of muscle mass.  GOAL:   Patient will meet greater than or equal to 90% of their needs  MONITOR:   PO intake, Supplement acceptance, Labs, Weight trends, I & O's  REASON FOR ASSESSMENT:   Consult New TPN/TNA (Calorie Count)  ASSESSMENT:   51 y.o. 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 But he was concerned about her weight loss. As she is cachectic her weight on 07/20/2014 was 74 kg on the day of admission she is 48.5. And he recommended admission for evaluation and a nutritional consult.  Patient states she has gastric bypass in 2007. Pt was able to eat a normal diet with normal  progression after the surgery. Pt was recently hospitalized in December 2017 for anastomotic strictures and underwent multiple EGDs. Pt was then advised to follow a pureed diet. Pt states it has been difficult to follow a pureed diet since she does not own a blender and she was recently staying at a facility that did not accommodate to her needs. Patient has not been taking any vitamins and calcium supplements. Pt takes her medications crushed in applesauce currently. Pt states she was able to tolerate pureed foods but that she was unable to eat much at one time and she had no way to pureed certain foods like meats or tougher food items.  Pt states that she does not tolerate milk based protein supplements well. She also reports financial issues where she cannot afford supplements. She likes [employer] and upon RN request RD switched order for Ensure to [employer]. RD ordered the supplement QID and pt is willing to try Unjury Chicken soup supplements as well.  In total if pt consumes all supplements ordered, this will provide her with 1200 kcal (80% of needs) and  78g protein (100% of needs). Encouraged pt to consume other protein-rich liquids such as yogurt and soy milk if tolerated. Calorie Count in progress.  Since admission, pt states she has consumed ginger ale, Sprite, pudding and sweet tea.   Medications: IV Mg Sulfate once, Iv Protonix daily, KCl infusion, Zofran ODT PRN Labs reviewed: Low K, Mg TG: 106  Plan per Pharmacy 3/29: KCl 17meq IV x 6 Mag sulfate 2gm IV x 1  At 1800 today:  Start Clinimix E 5/15 at 30 ml/hr.  20% fat emulsion at 85ml/hr over 12 hours  Diet Order:  Diet full liquid Room service appropriate? Yes; Fluid consistency: Thin TPN (CLINIMIX-E) Adult  Skin:  Reviewed, no issues  Last BM:  3/29  Height:   Ht Readings from Last 1 Encounters:  07/21/16 5' (1.524 m)    Weight:   Wt Readings from Last 1 Encounters:  07/21/16 101 lb 6.4 oz (46 kg)     Ideal Body Weight:  45.5 kg  BMI:  Body mass index is 19.8 kg/m.  Estimated Nutritional Needs:   Kcal:  1500-1700  Protein:  65-75g  Fluid:  1.7L/day  EDUCATION NEEDS:   Education needs addressed  Clayton Bibles, MS, RD, LDN Pager: [phone] After Hours Pager: [phone]

## 2016-07-22 NOTE — Progress Notes (Addendum)
PROGRESS NOTE    Angie [name]  SAY:[id number] DOB: 1965/12/01 DOA: 07/21/2016 PCP: Ricke Hey, MD   Brief Narrative:  51 year old female with past medical history of gastric bypass status post multiple EGDs for anastomotic strictures, hypertension, chronic abdominal pain presented to GI office for EGD which showed significant stricture with anastomotic ulcer. She is also noted to have significant weight loss of over 35 pounds in the short period.   Assessment & Plan:   Active Problems:   Abdominal pain, chronic, epigastric   Protein-calorie malnutrition, severe   Gastrojejunal anastomotic stricture   Chronic pain syndrome   Anastomotic ulcer   Abdominal pain  Abdominal pain with an anastomotic ulcer and gastrojejunal stricture -No melanotic stool, continue IV Protonix for now. -Pain control and gentle hydration if needed  Severe protein calorie malnutrition secondary to stricture and chronic abdominal pain -We will try to get her some clear liquid diet -PICC line placed, TNA to be started. -Follow-up outpatient with bariatric surgery  Hypokalemia -Continue repleted with her nutrition.  Chronic left-sided low back pain-tender to palpation -Likely musculoskeletal in nature -We'll order for heat pads and lidocaine patch for now.   DVT prophylaxis: SCDs Code Status: Full Family Communication:  Patient comprehends well Disposition Plan: Likely discharge within next 24 hours once her IV nutrition has reached goal and is able to tolerate oral diet more.  It is my clinical opinion that admission to INPATIENT is reasonable and necessary in this 51 y.o. female . presenting with symptoms of nausea/vomiting, concerning for worsening of her nutritional state of severe protein calorie malnutrition exacerbated by the anastomotic ulcer.  . in the context of PMH including: Gastric bypass surgeries with multiple anastomosis and strictures.  . with pertinent positives on physical  exam including: temporal wasting, cachexia.  Marland Kitchen and pertinent positives on radiographic and laboratory data including: EGD shows anastomosis ulcer and stricturing.  . Workup and treatment include PICC line placed for TPN and then able to drink as much liquid as possible along with antiemetics.   Given the aforementioned, the predictability of an adverse outcome is felt to be significant. I expect that the patient will require at least 2 midnights in the hospital to treat this condition.     Consultants:   Gastroenterology  Procedures:   PICC line placed on 07/21/2016  Antimicrobials:   None   Subjective: Patient states she feels little nauseous at this time especially after eating her meal this morning. Tolerated PICC line placement yesterday. She also reports of low sided back pain which is mostly tender to palpation and worse with movement.  Objective: Vitals:   07/21/16 1321 07/21/16 2130 07/22/16 0511 07/22/16 1300  BP: (!) 156/114 (!) 130/93 112/71 109/74  Pulse: 76 76 98 92  Resp: 16 16 16 18   Temp: 97.6 F (36.4 C) 97.4 F (36.3 C) 97.6 F (36.4 C) 97.9 F (36.6 C)  TempSrc: Oral Oral Oral Oral  SpO2: 100% 100% 100% 100%  Weight: 46 kg (101 lb 6.4 oz)     Height: 5' (1.524 m)       Intake/Output Summary (Last 24 hours) at 07/22/16 1536 Last data filed at 07/22/16 1430  Gross per 24 hour  Intake              880 ml  Output             1800 ml  Net             -920 ml  Filed Weights   07/21/16 1321  Weight: 46 kg (101 lb 6.4 oz)    Examination:  General exam: Appears calm and comfortable , Cachexia and temporal wasting Respiratory system: Clear to auscultation. Respiratory effort normal. Cardiovascular system: S1 & S2 heard, RRR. No JVD, murmurs, rubs, gallops or clicks. No pedal edema. Gastrointestinal system: Abdomen is nondistended, soft and nontender. No organomegaly or masses felt. Normal bowel sounds heard. Central nervous system: Alert and  oriented. No focal neurological deficits. Extremities: Symmetric 5 x 5 power. Skin: No rashes, lesions or ulcers Psychiatry: Judgement and insight appear normal. Mood & affect appropriate.  Left lower back area tender to deep palpation.   Data Reviewed:   CBC:  Recent Labs Lab 07/21/16 1429 07/22/16 0407  WBC 3.9* 4.4  NEUTROABS 2.3 2.2  HGB 10.8* 9.5*  HCT 31.4* 27.9*  MCV 88.7 93.0  PLT 484* 177   Basic Metabolic Panel:  Recent Labs Lab 07/21/16 1429 07/22/16 0407  NA 141 141  K 3.5 3.0*  CL 111 115*  CO2 20* 17*  GLUCOSE 72 181*  BUN 10 6  CREATININE 0.57 0.63  CALCIUM 9.2 8.5*  MG  --  1.6*  PHOS  --  4.1   GFR: Estimated Creatinine Clearance: 61.1 mL/min (by C-G formula based on SCr of 0.63 mg/dL). Liver Function Tests:  Recent Labs Lab 07/21/16 1429 07/22/16 0407  AST 15 21  ALT 12* 11*  ALKPHOS 80 59  BILITOT 0.5 0.4  PROT 7.9 6.2*  ALBUMIN 4.2 3.2*   No results for input(s): LIPASE, AMYLASE in the last 168 hours. No results for input(s): AMMONIA in the last 168 hours. Coagulation Profile: No results for input(s): INR, PROTIME in the last 168 hours. Cardiac Enzymes: No results for input(s): CKTOTAL, CKMB, CKMBINDEX, TROPONINI in the last 168 hours. BNP (last 3 results) No results for input(s): PROBNP in the last 8760 hours. HbA1C: No results for input(s): HGBA1C in the last 72 hours. CBG: No results for input(s): GLUCAP in the last 168 hours. Lipid Profile:  Recent Labs  07/22/16 0407  TRIG 106   Thyroid Function Tests:  Recent Labs  07/21/16 1429  TSH 1.414   Anemia Panel: No results for input(s): VITAMINB12, FOLATE, FERRITIN, TIBC, IRON, RETICCTPCT in the last 72 hours. Sepsis Labs: No results for input(s): PROCALCITON, LATICACIDVEN in the last 168 hours.  No results found for this or any previous visit (from the past 240 hour(s)).       Radiology Studies: No results found.      Scheduled Meds: . ALPRAZolam  1  mg Oral TID  . feeding supplement  1 Container Oral QID  . [START ON 07/23/2016] insulin aspart  0-9 Units Subcutaneous Q6H  . lidocaine  1 patch Transdermal Daily  . magnesium sulfate 1 - 4 g bolus IVPB  2 g Intravenous Once  . pantoprazole (PROTONIX) IV  40 mg Intravenous Daily  . protein supplement  8 oz Oral BID WC  . QUEtiapine  200 mg Oral QHS  . sodium chloride flush  10-40 mL Intracatheter Q12H   Continuous Infusions: . Marland KitchenTPN (CLINIMIX-E) Adult     And  . fat emulsion       LOS: 1 day    Time spent: 35 mins     Ankit Arsenio Loader, MD [facility] Pager [phone]   If 7PM-7AM, please contact night-coverage [email or web address] Password [facility] 07/22/2016, 3:36 PM

## 2016-07-22 NOTE — Progress Notes (Signed)
2Hypoglycemic Event  CBG:59*  Treatment: 15 GM carbohydrate snack  Symptoms: None  Follow-up CBG: [id number] CBG Result:100  Possible Reasons for Event: Inadequate meal intake  Comments/MD notified:dr amin     Angie [name], Angie [name]

## 2016-07-23 LAB — BASIC METABOLIC PANEL
Anion gap: 5 (ref 5–15)
BUN: <5 mg/dL — ABNORMAL LOW (ref 6–20)
CO2: 18 mmol/L — ABNORMAL LOW (ref 22–32)
Calcium: 8.7 mg/dL — ABNORMAL LOW (ref 8.9–10.3)
Chloride: 114 mmol/L — ABNORMAL HIGH (ref 101–111)
Creatinine, Ser: 0.7 mg/dL (ref 0.44–1.00)
GFR calc Af Amer: >60 mL/min (ref >60)
GFR calc non Af Amer: >60 mL/min (ref >60)
Glucose, Bld: 126 mg/dL — ABNORMAL HIGH (ref 65–99)
Potassium: 4.7 mmol/L (ref 3.5–5.1)
Sodium: 137 mmol/L (ref 135–145)

## 2016-07-23 LAB — GLUCOSE, CAPILLARY
Glucose-Capillary: 70 mg/dL (ref 65–99)
Glucose-Capillary: 108 mg/dL — ABNORMAL HIGH (ref 65–99)
Glucose-Capillary: 69 mg/dL (ref 65–99)
Glucose-Capillary: 77 mg/dL (ref 65–99)
Glucose-Capillary: 86 mg/dL (ref 65–99)
Glucose-Capillary: 68 mg/dL (ref 65–99)
Glucose-Capillary: 181 mg/dL — ABNORMAL HIGH (ref 65–99)
Glucose-Capillary: 113 mg/dL — ABNORMAL HIGH (ref 65–99)

## 2016-07-23 LAB — VITAMIN B12: Vitamin B-12: 334 pg/mL (ref 180–914)

## 2016-07-23 LAB — PHOSPHORUS: Phosphorus: 3.8 mg/dL (ref 2.5–4.6)

## 2016-07-23 LAB — MAGNESIUM: Magnesium: 2.2 mg/dL (ref 1.7–2.4)

## 2016-07-23 LAB — FOLATE: Folate: 8 ng/mL (ref >5.9)

## 2016-07-23 LAB — IRON AND TIBC
Iron: 45 ug/dL (ref 28–170)
Saturation Ratios: 22 % (ref 10.4–31.8)
TIBC: 202 ug/dL — ABNORMAL LOW (ref 250–450)
UIBC: 157 ug/dL

## 2016-07-23 LAB — FERRITIN: Ferritin: 86 ng/mL (ref 11–307)

## 2016-07-23 MED ORDER — VITAMIN B-1 100 MG PO TABS
100.0000 mg | ORAL_TABLET | Freq: Every day | ORAL | Status: AC
  Administered 2016-07-23 – 2016-07-25 (×3): 100 mg via ORAL
  Filled 2016-07-23 (×3): qty 1

## 2016-07-23 MED ORDER — FAT EMULSION 20 % IV EMUL
240.0000 mL | INTRAVENOUS | Status: AC
  Administered 2016-07-23: 240 mL via INTRAVENOUS
  Filled 2016-07-23: qty 250

## 2016-07-23 MED ORDER — TRACE MINERALS CR-CU-MN-SE-ZN 10-1000-500-60 MCG/ML IV SOLN
INTRAVENOUS | Status: AC
  Administered 2016-07-23: 18:00:00 via INTRAVENOUS
  Filled 2016-07-23: qty 720

## 2016-07-23 MED ORDER — INSULIN ASPART 100 UNIT/ML ~~LOC~~ SOLN
0.0000 [IU] | Freq: Three times a day (TID) | SUBCUTANEOUS | Status: DC
  Administered 2016-07-24: 1 [IU] via SUBCUTANEOUS
  Administered 2016-07-24: 2 [IU] via SUBCUTANEOUS
  Administered 2016-07-24 – 2016-07-25 (×3): 1 [IU] via SUBCUTANEOUS

## 2016-07-23 MED ORDER — OXYCODONE HCL 5 MG PO TABS
10.0000 mg | ORAL_TABLET | ORAL | Status: DC | PRN
  Administered 2016-07-23 – 2016-07-25 (×10): 10 mg via ORAL
  Filled 2016-07-23 (×10): qty 2

## 2016-07-23 NOTE — Progress Notes (Signed)
Calorie Count Day One 3/29  Estimated Nutritional Needs:   Kcal:  1500-1700 Protein:  65-75g Fluid:  1.7L/day  Breakfast: Grits 1 bowl, 1 chocolate pudding, 2 sprites  Total- 514kcal and 6g protein  Lunch: 1 hot tea, 1 strawberry yogurt, 2 sprite, 1 cranberry juice, 1 bowl cream of potato soup  Total- 524 kcal and 9g protein  Dinner: 2 sweet teas, 1 strawberry ice cream, 2 gingerales, 1 cranberry juice, 1 chocolate pudding, 2 bowls cream of potato soup  Total- 735 kcal and 10g protein   Supplements: 4 Boost Breeze  Total- 1000kcal and 36g protein  Total Intake:  2773 kcal (163% estimated needs) and  61g protein (94% estimated needs)  Pt is meeting greater than estimated energy needs and meeting 94% estimated protein needs with meals and supplements.   Koleen Distance, RD, LDN Pager #- [phone]

## 2016-07-23 NOTE — Progress Notes (Signed)
Nutrition Follow-Up Note  DOCUMENTATION CODES:   Severe malnutrition in context of chronic illness  INTERVENTION:   TPN per Pharmacy  Currently:  At 1800 today:  Continue Clinimix E 5/15 at 30 ml/hr; no need to advance while taking in consistent supplementation. Issue will be with ability to continue supplementation at discharge. Could easily/quickly transition to cyclic regimen if this rate will meet goals.  20% fat emulsion at 68m/hr over 12 hours  Plan to advance as tolerated to the goal rate.  TPN to contain standard multivitamins. Will add trace elements on MWF  Continue sensitive scale SSI w/ CBG checks q6h.   BMet with mag and phos tomorrow am  TPN lab panels on Mondays & Thursdays.  F/u daily.  Boost Breeze po QID, each supplement provides 250 kcal and 9 grams of protein  RD will discontinue Unjury as pt is not drinking and does not appear to need it  Calorie count in progress  Monitor magnesium, potassium, and phosphorus daily for at least 3 days, MD to replete as needed, as pt is at risk for refeeding syndrome given severe malnutrition, poor PO intake PTA, low K and Mg levels.  Consider Thiamine supplementation, 100 mg daily for at least 3 days given refeeding syndrome risk.  NUTRITION DIAGNOSIS:   Malnutrition (Severe) related to chronic illness (chronic G-J stricture) as evidenced by percent weight loss, energy intake < or equal to 75% for > or equal to 1 month, severe depletion of body fat, severe depletion of muscle mass.  GOAL:   Patient will meet greater than or equal to 90% of their needs  MONITOR:   PO intake, Supplement acceptance, Labs, Weight trends, I & O's, Other (Comment) (TPN tolerance )  ASSESSMENT:   51year old female with past medical history of gastric bypass status post multiple EGDs for anastomotic strictures, hypertension, chronic abdominal pain presented to GI office for EGD which showed significant stricture with anastomotic  ulcer. She is also noted to have significant weight loss of over 35 pounds in the short period.  Met with pt in room today. Calorie count started yesterday; pt currently exceeding estimated needs with meals, supplements, and TPN. Pt eating 100% full liquid diet and drinking 4 Boost Breeze per day. RD discontinued Unjury as pt does not need at this time. Plan is for pt to discharge with TPN to attempt to get pt nutritionally stable for surgery scheduled 4/12. Pt meeting estimated needs via meals and supplements without addition of TPN. Concern is that upon discharge, pt will not be able to obtain supplements at home due to affordability and will not be able to meet needs via limited pureed diet alone. Pt has been non-compliant with home diets in the past. For this reason, pt plans to discharge with TPN. RD discussed with pt at length the purpose of her home TPN and the importance of continued oral intake to keep her gut functioning. RD advised pt to communicate with [place] she is drinking supplements at home so that her TPN can be adjusted accordingly. Pt tolerating TPN well; TPN is not at goal.   Medications: insulin, protonix, thiamine, oxycodone   Labs reviewed: Cl 114(H), BUN <5(L), Ca 8.7(L) adj. 9.34 wnl, Alb 3.2(L), P 3.8 wnl, Mg 2.2 wnl, prealb 12.2(L) Hgb 9.5(L), Hct 27.9(L) cbgs- 181, 126 x 24 hrs Triglycerides 106 wnl  Diet Order:  Diet full liquid Room service appropriate? Yes; Fluid consistency: Thin TPN (CLINIMIX-E) Adult TPN (CLINIMIX-E) Adult  Skin:  Reviewed, no issues  Last BM:  3/29  Height:   Ht Readings from Last 1 Encounters:  07/21/16 5' (1.524 m)    Weight:   Wt Readings from Last 1 Encounters:  07/21/16 101 lb 6.4 oz (46 kg)    Ideal Body Weight:  45.5 kg  BMI:  Body mass index is 19.8 kg/m.  Estimated Nutritional Needs:   Kcal:  1500-1700  Protein:  65-75g  Fluid:  1.7L/day  EDUCATION NEEDS:   Education needs  addressed  Koleen Distance, RD, LDN Pager #- [phone]

## 2016-07-23 NOTE — Care Management Note (Signed)
Case Management Note  Patient Details  Name: Angie [name] MRN: [id number] Date of Birth: 05/28/1965  Subjective/Objective:     51 yo admitted with abdominal pain. History of gastric bypass status post multiple EGDs for anastomotic strictures               Action/Plan: Pt to dc home with oldest son. Per MD notes pt is to dc home with home TPN and HHPT. This CM met with pt at bedside to offer choice for home health services. Pt chooses [facility] and [facility] infusion rep called for referral. Will need MD orders for HHRN/HHPT, face to face, script for home TPN and order for TPN per [facility] infusion protocol.  CM will continue to follow.  Expected Discharge Date:   (unknown)               Expected Discharge Plan:  [place]  In-House Referral:     Discharge planning Services  CM Consult  Post Acute Care Choice:  Home Health Choice offered to:  Patient  DME Arranged:  IV pump/equipment DME Agency:  [place]:  RN, PT [facility] Agency:  [place]  Status of Service:  In process, will continue to follow  If discussed at Long Length of Stay Meetings, dates discussed:    Additional CommentsLynnell Catalan, RN 07/23/2016, 9:52 AM  [phone]

## 2016-07-23 NOTE — Progress Notes (Addendum)
 PROGRESS NOTE    Angie [name]  DJS:[id number] DOB: [birth year] DOA: 07/21/2016 PCP: Ricke Hey, MD   Brief Narrative:  51 year old female with past medical history of gastric bypass status post multiple EGDs for anastomotic strictures, hypertension, chronic abdominal pain presented to GI office for EGD which showed significant stricture with anastomotic ulcer. She is also noted to have significant weight loss of over 35 pounds in the short period.   Assessment & Plan:   Active Problems:   Abdominal pain, chronic, epigastric   Protein-calorie malnutrition, severe   Gastrojejunal anastomotic stricture   Chronic pain syndrome   Anastomotic ulcer   Abdominal pain  Abdominal pain with an anastomotic ulcer and gastrojejunal stricture -No melanotic stool, continue IV Protonix for now. -Pain control and gentle hydration if needed  Severe protein calorie malnutrition secondary to stricture and chronic abdominal pain -We will try to get her some clear liquid diet but no more than full liquid. PPI BID -PICC line placed, TNA to be started. Thiamine added -Monitor her electrolytes and for refeeding syndrome. Nutrition team is following at this time. -Follow-up outpatient with bariatric surgery -Order vitamin D levels.  Hypokalemia -Continue repleted with her nutrition.  Chronic left-sided low back pain-tender to palpation -Likely musculoskeletal in nature -heat pads and lidocaine patch for now.  She lives at home with her son but does not have much of help at home. I will consult physical therapy given her condition and weakness.  DVT prophylaxis: SCDs Code Status: Full Family Communication:  Patient comprehends well Disposition Plan: Cont Inpatient stay.      Consultants:   Gastroenterology  Procedures:   PICC line placed on 07/21/2016  Antimicrobials:   None   Subjective: States she feels about the same as yesterday. Doesn't have any new complaints. No  acute events overnight. She is trying to drink as much fluid as she can.   Objective: Vitals:   07/22/16 1300 07/22/16 2044 07/23/16 0005 07/23/16 0439  BP: 109/74 115/86 92/61 97/70   Pulse: 92 76 (!) 103 78  Resp: 18 18 14 18   Temp: 97.9 F (36.6 C) 98 F (36.7 C) 98.3 F (36.8 C) 97.9 F (36.6 C)  TempSrc: Oral Oral Axillary Oral  SpO2: 100% 99% 100% 100%  Weight:      Height:        Intake/Output Summary (Last 24 hours) at 07/23/16 1252 Last data filed at 07/23/16 0955  Gross per 24 hour  Intake          1811.33 ml  Output             2400 ml  Net          -588.67 ml   Filed Weights   07/21/16 1321  Weight: 46 kg (101 lb 6.4 oz)    Examination:  General exam: Appears calm and comfortable , Cachexia and temporal wasting Respiratory system: Clear to auscultation. Respiratory effort normal. Cardiovascular system: S1 & S2 heard, RRR. No JVD, murmurs, rubs, gallops or clicks. No pedal edema. Gastrointestinal system: Abdomen is nondistended, soft and nontender. No organomegaly or masses felt. Normal bowel sounds heard. Central nervous system: Alert and oriented. No focal neurological deficits. Extremities: Symmetric 5 x 5 power. Skin: No rashes, lesions or ulcers Psychiatry: Judgement and insight appear normal. Mood & affect appropriate.  Left lower back area tender to deep palpation.   Data Reviewed:   CBC:  Recent Labs Lab 07/21/16 1429 07/22/16 0407  WBC 3.9* 4.4  NEUTROABS 2.3  2.2  HGB 10.8* 9.5*  HCT 31.4* 27.9*  MCV 88.7 93.0  PLT 484* 469   Basic Metabolic Panel:  Recent Labs Lab 07/21/16 1429 07/22/16 0407 07/23/16 0520  NA 141 141 137  K 3.5 3.0* 4.7  CL 111 115* 114*  CO2 20* 17* 18*  GLUCOSE 72 181* 126*  BUN 10 6 <5*  CREATININE 0.57 0.63 0.70  CALCIUM 9.2 8.5* 8.7*  MG  --  1.6* 2.2  PHOS  --  4.1 3.8   GFR: Estimated Creatinine Clearance: 61.1 mL/min (by C-G formula based on SCr of 0.7 mg/dL). Liver Function Tests:  Recent  Labs Lab 07/21/16 1429 07/22/16 0407  AST 15 21  ALT 12* 11*  ALKPHOS 80 59  BILITOT 0.5 0.4  PROT 7.9 6.2*  ALBUMIN 4.2 3.2*   No results for input(s): LIPASE, AMYLASE in the last 168 hours. No results for input(s): AMMONIA in the last 168 hours. Coagulation Profile: No results for input(s): INR, PROTIME in the last 168 hours. Cardiac Enzymes: No results for input(s): CKTOTAL, CKMB, CKMBINDEX, TROPONINI in the last 168 hours. BNP (last 3 results) No results for input(s): PROBNP in the last 8760 hours. HbA1C: No results for input(s): HGBA1C in the last 72 hours. CBG:  Recent Labs Lab 07/23/16 0508 07/23/16 0537 07/23/16 0816 07/23/16 0853 07/23/16 1155  GLUCAP 70 108* 69 77 86   Lipid Profile:  Recent Labs  07/22/16 0407  TRIG 106   Thyroid Function Tests:  Recent Labs  07/21/16 1429  TSH 1.414   Anemia Panel:  Recent Labs  07/23/16 0816  VITAMINB12 334  FOLATE 8.0  FERRITIN 86  TIBC 202*  IRON 45   Sepsis Labs: No results for input(s): PROCALCITON, LATICACIDVEN in the last 168 hours.  No results found for this or any previous visit (from the past 240 hour(s)).       Radiology Studies: No results found.      Scheduled Meds: . ALPRAZolam  1 mg Oral TID  . feeding supplement  1 Container Oral QID  . insulin aspart  0-9 Units Subcutaneous TID AC & HS  . lidocaine  1 patch Transdermal Daily  . pantoprazole (PROTONIX) IV  40 mg Intravenous Q12H  . protein supplement  8 oz Oral BID WC  . QUEtiapine  200 mg Oral QHS  . sodium chloride flush  10-40 mL Intracatheter Q12H  . thiamine  100 mg Oral Daily   Continuous Infusions: . Marland KitchenTPN (CLINIMIX-E) Adult     And  . fat emulsion    . Marland KitchenTPN (CLINIMIX-E) Adult 30 mL/hr at 07/23/16 0120     LOS: 2 days    Time spent: 35 mins      Arsenio Loader, MD [facility] Pager [phone]   If 7PM-7AM, please contact night-coverage [email or web address] Password [email or web address] 07/23/2016, 12:52 PM

## 2016-07-23 NOTE — Progress Notes (Signed)
PHARMACY - ADULT TOTAL PARENTERAL NUTRITION CONSULT NOTE   Pharmacy Consult for TPN Indication: malnutrition  Patient Measurements: Height: 5' (152.4 cm) Weight: 101 lb 6.4 oz (46 kg) IBW/kg (Calculated) : 45.5 TPN AdjBW (KG): 46 Body mass index is 19.8 kg/m. Usual Weight: 70-75kg  Insulin Requirements: none yesterday  Current Nutrition: FLD and Boost Breeze 4 x/day with Unjury BID  IVF: none  Central access: 3/28 TPN start date: 3/29  ASSESSMENT                                                                                                          HPI:  51 yo F with hx gastric bypass & chronic gastrojejunal stricture refractory to serial dilations and leading to malnutrition / significant weight weight loss over last 6 months. Appears to be issues with noncompliance to pureed diet and inability to afford supplements  Significant events:   3/30: Current plan is for discharge on home TPN to improve nutrition prior to surgery which is scheduled for April 12 currently.  Today:    Glucose - at goal (100-150) today w/o insulin use  Electrolytes - bicarb low, all others WNL; K/Mg improved after supplementation yesterday  Renal - stable WNL  LFTs - WNL  TGs - wnl  Prealbumin - moderately low at 12.2  NUTRITIONAL GOALS                                                                                             RD recs:  Kcal:  1500-1700 Protein:  65-75g Fluid:  1.7L/day   Taking Boost x 4 yesterday but no Unjury = 1000 kcal, 36g protein  Clinimix 5/15 at 30 ml/hr plus IV lipids at 240 ml/day provides 36 g protein and 991 kcal/day, which meets protein and exceeds caloric needs considering above supplement intake  PLAN                                                                                                                     At 1800 today:  Continue Clinimix E 5/15 at 30 ml/hr; no need to advance while taking in consistent supplementation. Issue will be with  ability to continue supplementation at  discharge. Could easily/quickly transition to cyclic regimen if this rate will meet goals.  20% fat emulsion at 47ml/hr over 12 hours  Plan to advance as tolerated to the goal rate.  TPN to contain standard multivitamins. Will add trace elements on MWF  Continue sensitive scale SSI w/ CBG checks q6h.   BMet with mag and phos tomorrow am  TPN lab panels on Mondays & Thursdays.  F/u daily.   Reuel Boom, PharmD, BCPS Pager: [phone] 07/23/2016, 10:29 AM

## 2016-07-23 NOTE — Progress Notes (Signed)
Hypoglycemic Event  CBG: 70 at 0510  Treatment: 4 oz orange juice  Symptoms: lethargic  Follow-up CBG: Time:0537 CBG Result:108  Possible Reasons for Event: decreased po intake  Comments/MD notified:N/A protocol followed.     Roderick Pee

## 2016-07-23 NOTE — Progress Notes (Signed)
[facility]  [name] is a new pt for [facility] this hospital admission.  [facility] will be providing HHRN, PT and Home TNA Pharmacy services for home TNA upon DC.  [name] Infusion Coordinator will provide in hospital teaching to support transition home and independence with the TNA at home.  [facility] is prepared for DC on Saturday or Sunday. [facility] will need HHRN, PT order as well as Order stating :  "TPN Per [facility] Protocol" for home TNA. Thank you.  If patient discharges after hours, please call [phone].   Larry Sierras 07/23/2016, 9:59 AM

## 2016-07-24 LAB — BASIC METABOLIC PANEL
Anion gap: 5 (ref 5–15)
BUN: 5 mg/dL — ABNORMAL LOW (ref 6–20)
CO2: 18 mmol/L — ABNORMAL LOW (ref 22–32)
Calcium: 8.5 mg/dL — ABNORMAL LOW (ref 8.9–10.3)
Chloride: 113 mmol/L — ABNORMAL HIGH (ref 101–111)
Creatinine, Ser: 0.65 mg/dL (ref 0.44–1.00)
GFR calc Af Amer: >60 mL/min (ref >60)
GFR calc non Af Amer: >60 mL/min (ref >60)
Glucose, Bld: 80 mg/dL (ref 65–99)
Potassium: 4.7 mmol/L (ref 3.5–5.1)
Sodium: 136 mmol/L (ref 135–145)

## 2016-07-24 LAB — GLUCOSE, CAPILLARY
Glucose-Capillary: 121 mg/dL — ABNORMAL HIGH (ref 65–99)
Glucose-Capillary: 123 mg/dL — ABNORMAL HIGH (ref 65–99)
Glucose-Capillary: 125 mg/dL — ABNORMAL HIGH (ref 65–99)
Glucose-Capillary: 141 mg/dL — ABNORMAL HIGH (ref 65–99)
Glucose-Capillary: 150 mg/dL — ABNORMAL HIGH (ref 65–99)
Glucose-Capillary: 164 mg/dL — ABNORMAL HIGH (ref 65–99)
Glucose-Capillary: 75 mg/dL (ref 65–99)
Glucose-Capillary: 77 mg/dL (ref 65–99)

## 2016-07-24 LAB — VITAMIN D 25 HYDROXY (VIT D DEFICIENCY, FRACTURES): Vit D, 25-Hydroxy: 6.9 ng/mL — ABNORMAL LOW (ref 30.0–100.0)

## 2016-07-24 LAB — MAGNESIUM: Magnesium: 1.7 mg/dL (ref 1.7–2.4)

## 2016-07-24 LAB — PHOSPHORUS: Phosphorus: 4.5 mg/dL (ref 2.5–4.6)

## 2016-07-24 MED ORDER — FAT EMULSION 20 % IV EMUL
240.0000 mL | INTRAVENOUS | Status: AC
  Administered 2016-07-24: 240 mL via INTRAVENOUS
  Filled 2016-07-24: qty 240

## 2016-07-24 MED ORDER — M.V.I. ADULT IV INJ
INTRAVENOUS | Status: AC
  Administered 2016-07-24: 19:00:00 via INTRAVENOUS
  Filled 2016-07-24: qty 720

## 2016-07-24 NOTE — Progress Notes (Signed)
Patient Demographics:    Angie [name], is a 51 y.o. female, DOB - 1965-12-26, GHW:[id number]  Admit date - 07/21/2016   Admitting Physician Charlynne Cousins, MD  Outpatient Primary MD for the patient is Ricke Hey, MD  LOS - 3   No chief complaint on file.       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 Appears to be issues with noncompliance with pureed diet and also unable  to afford supplements, : Current plan is for discharge 07/25/16 on home TPN at 60 ml/hr to improve nutrition prior to surgery which is scheduled for April 12 currently.   1)Abdominal pain with an anastomotic ulcer and gastrojejunal stricture- Improved, continue PPI, oral intake as tolerated  2)Severe protein calorie malnutrition secondary to stricture and chronic abdominal pain- liquid diet as tolerated,  Appears to be issues with noncompliance with pureed diet and also unable  to afford supplements, : Current plan is for discharge 07/25/16 on home TPN at 60 ml/hr to improve nutrition prior to surgery which is scheduled for April 12 currently. Follow vitamin D levels and electrolytes  3)Dispo-patient states she is unable to live with her significant other as he is an alcoholic, with potential for verbal arguments, she will like to hold off on discharge for 24 hours so that she can arrange to go live with his son   DVT prophylaxis: SCDs Code Status: Full Family  Communication:  Patient comprehends well Disposition Plan:  home in am     Consultants:   Gastroenterology  Procedures:   PICC line placed on 07/21/2016   Lab Results  Component Value Date   PLT 400 07/22/2016    Inpatient Medications  Scheduled Meds: . ALPRAZolam  1 mg Oral TID  . feeding supplement  1 Container Oral QID  . insulin aspart  0-9 Units Subcutaneous TID AC & HS  . lidocaine  1 patch Transdermal Daily  . pantoprazole (PROTONIX) IV  40 mg Intravenous Q12H  . QUEtiapine  200 mg Oral QHS  . sodium chloride flush  10-40 mL Intracatheter Q12H  . thiamine  100 mg Oral Daily   Continuous Infusions: . Marland KitchenTPN (CLINIMIX-E) Adult     And  . fat emulsion    . Marland KitchenTPN (CLINIMIX-E) Adult 30 mL/hr at 07/23/16 1736   PRN Meds:.ondansetron, [DISCONTINUED] oxyCODONE-acetaminophen **AND** oxyCODONE, polyethylene glycol, sodium chloride flush    Anti-infectives    None        Objective:   Vitals:   07/23/16 0439 07/23/16 1312 07/23/16 2030 07/24/16 0453  BP: 97/70 97/66 98/84  106/71  Pulse: 78 98 (!) 111 92  Resp: 18 16 14 16   Temp: 97.9 F (36.6 C) 99.1 F (37.3 C) 98 F (36.7 C) 98.6 F (37 C)  TempSrc:  Oral Oral Oral Oral  SpO2: 100% 100% 100% 99%  Weight:      Height:        Wt Readings from Last 3 Encounters:  07/21/16 46 kg (101 lb 6.4 oz)  07/21/16 48.5 kg (107 lb)  05/27/16 48.5 kg (107 lb)     Intake/Output Summary (Last 24 hours) at 07/24/16 1455 Last data filed at 07/24/16 0925  Gross per 24 hour  Intake             1350 ml  Output             1850 ml  Net             -500 ml     Physical Exam  Gen:- Awake Alert,  In no apparent distress  HEENT:- [place].AT, No sclera icterus Neck-Supple Neck,No JVD,.  Lungs-  CTAB  CV- S1, S2 normal Abd-  +ve B.Sounds, Abd Soft, No tenderness,    Extremity/Skin:- No  edema,   , Rt arm Picc Line Site is C/D/I     Data Review:   Micro Results No results found for this or any previous visit  (from the past 240 hour(s)).  Radiology Reports No results found.   CBC  Recent Labs Lab 07/21/16 1429 07/22/16 0407  WBC 3.9* 4.4  HGB 10.8* 9.5*  HCT 31.4* 27.9*  PLT 484* 400  MCV 88.7 93.0  MCH 30.5 31.7  MCHC 34.4 34.1  RDW 11.9 12.3  LYMPHSABS 1.3 1.9  MONOABS 0.3 0.3  EOSABS 0.0 0.1  BASOSABS 0.0 0.0    Chemistries   Recent Labs Lab 07/21/16 1429 07/22/16 0407 07/23/16 0520 07/24/16 0407  NA 141 141 137 136  K 3.5 3.0* 4.7 4.7  CL 111 115* 114* 113*  CO2 20* 17* 18* 18*  GLUCOSE 72 181* 126* 80  BUN 10 6 <5* 5*  CREATININE 0.57 0.63 0.70 0.65  CALCIUM 9.2 8.5* 8.7* 8.5*  MG  --  1.6* 2.2 1.7  AST 15 21  --   --   ALT 12* 11*  --   --   ALKPHOS 80 59  --   --   BILITOT 0.5 0.4  --   --    ------------------------------------------------------------------------------------------------------------------  Recent Labs  07/22/16 0407  TRIG 106    Lab Results  Component Value Date   HGBA1C 4.8 04/25/2016   ------------------------------------------------------------------------------------------------------------------ No results for input(s): TSH, T4TOTAL, T3FREE, THYROIDAB in the last 72 hours.  Invalid input(s): FREET3 ------------------------------------------------------------------------------------------------------------------  Recent Labs  07/23/16 0816  VITAMINB12 334  FOLATE 8.0  FERRITIN 86  TIBC 202*  IRON 45    Coagulation profile No results for input(s): INR, PROTIME in the last 168 hours.  No results for input(s): DDIMER in the last 72 hours.  Cardiac Enzymes No results for input(s): CKMB, TROPONINI, MYOGLOBIN in the last 168 hours.  Invalid input(s): CK ------------------------------------------------------------------------------------------------------------------ No results found for: BNP   Angie [name] M.D on 07/24/2016 at 2:55 PM  Between 7am to 7pm - Pager - [phone]  After 7pm go to  [email or web address] - password [email or web address]  [facility] -  Office  [phone]  Dragon dictation system was used to create this note, attempts have been made to correct errors, however presence of uncorrected errors is not a reflection quality of care provided
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
## 2016-07-24 NOTE — Progress Notes (Signed)
PT Cacellation Note  Patient Details Name: Angie [name] MRN: [id number] DOB: 1966-03-04   Treatment:     Eval completed... Pt very slow and labored with gait. Used RW  And ambulated in hallway flexed posture c/o general pain especially in low back area and weakness all over. To benefit from further therapy, talked to patient about SNF, however she really would like to have HHPT and be at her home with her son.   Rec HHPT and RW.Marland Kitchen Complete  Write up to follow.    Clide Dales 07/24/2016, 2:56 PM  Clide Dales, PT Pager: [phone] 07/24/2016

## 2016-07-24 NOTE — Progress Notes (Signed)
PHARMACY - ADULT TOTAL PARENTERAL NUTRITION CONSULT NOTE   Pharmacy Consult for TPN Indication: malnutrition  Patient Measurements: Height: 5' (152.4 cm) Weight: 101 lb 6.4 oz (46 kg) IBW/kg (Calculated) : 45.5 TPN AdjBW (KG): 46 Body mass index is 19.8 kg/m. 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 Appears to be issues with noncompliance to pureed diet and inability to afford supplements  Significant events:   3/30: Current plan is for discharge 3/31 on home TPN at 60 ml/hr to improve nutrition prior to surgery which is scheduled for April 12 currently.  Today:   Glucose - < 150gm/dl  Electrolytes - Cl sl high, bicarb sl low, all others WNL; K/Mg improved after supplementation  Renal - stable WNL  LFTs - WNL  TGs - wnl  Prealbumin - moderately low at 12.2  NUTRITIONAL GOALS                                                                                             RD recs:  Kcal:  1500-1700 Protein:  65-75g Fluid:  1.7L/day   Taking Boost x 4 yesterday but no Unjury = 1000 kcal, 36g protein  Clinimix 5/15 at 30 ml/hr plus IV lipids at 240 ml/day provides 36 g protein and 991 kcal/day, which meets protein and exceeds caloric needs considering above supplement intake  PLAN                                                                                                                     At 1800 today:  Continue Clinimix E 5/15 at 30 ml/hr; no need to advance while taking in consistent supplementation. Issue will be with ability to continue  supplementation at  discharge. Could easily/quickly transition to cyclic regimen if this rate will meet goals.  20% fat emulsion at 71ml/hr over 12 hours  Plan discharge tomorrow on TPN or supplements  TPN to contain standard multivitamins. Will add trace elements on MWF  Continue sensitive scale SSI w/ CBG checks q6h.   TPN lab panels on Mondays & Thursdays.  F/u daily.  Minda Ditto PharmD Pager [phone] 07/24/2016, 2:24 PM

## 2016-07-24 NOTE — Progress Notes (Signed)
PT Cacellation Note  Patient Details Name: Angie [name] MRN: [id number] DOB: 1965-09-28   :     Chart reviewed, and spoke to patient. She stated she does feel weak and a little unsteady on feet. She has had a lot of clinicians in room last hour, and politely asked if I could come back in an hour to assess her that she would like to finish her breakfast.   Will return this morning.   Clide Dales 07/24/2016, 9:54 AM  Clide Dales, PT Pager: [phone] 07/24/2016

## 2016-07-25 LAB — BASIC METABOLIC PANEL
Anion gap: 3 — ABNORMAL LOW (ref 5–15)
BUN: 8 mg/dL (ref 6–20)
CO2: 23 mmol/L (ref 22–32)
Calcium: 8.7 mg/dL — ABNORMAL LOW (ref 8.9–10.3)
Chloride: 114 mmol/L — ABNORMAL HIGH (ref 101–111)
Creatinine, Ser: 0.5 mg/dL (ref 0.44–1.00)
GFR calc Af Amer: >60 mL/min (ref >60)
GFR calc non Af Amer: >60 mL/min (ref >60)
Glucose, Bld: 55 mg/dL — ABNORMAL LOW (ref 65–99)
Potassium: 4 mmol/L (ref 3.5–5.1)
Sodium: 140 mmol/L (ref 135–145)

## 2016-07-25 LAB — GLUCOSE, CAPILLARY
Glucose-Capillary: 98 mg/dL (ref 65–99)
Glucose-Capillary: 121 mg/dL — ABNORMAL HIGH (ref 65–99)
Glucose-Capillary: 72 mg/dL (ref 65–99)
Glucose-Capillary: 135 mg/dL — ABNORMAL HIGH (ref 65–99)

## 2016-07-25 LAB — MAGNESIUM: Magnesium: 1.4 mg/dL — ABNORMAL LOW (ref 1.7–2.4)

## 2016-07-25 MED ORDER — ENSURE CLEAR PO LIQD: 1.0000 | Freq: Four times a day (QID) | ORAL | 11 refills | Status: DC

## 2016-07-25 MED ORDER — QUETIAPINE FUMARATE 100 MG PO TABS: 100.0000 mg | ORAL_TABLET | Freq: Every day | ORAL | 2 refills | Status: DC

## 2016-07-25 MED ORDER — HEPARIN SOD (PORK) LOCK FLUSH 100 UNIT/ML IV SOLN
250.0000 [IU] | Freq: Once | INTRAVENOUS | Status: AC
  Administered 2016-07-25: 250 [IU] via INTRAVENOUS
  Filled 2016-07-25: qty 5

## 2016-07-25 MED ORDER — OXYCODONE-ACETAMINOPHEN 10-325 MG PO TABS
1.0000 | ORAL_TABLET | Freq: Four times a day (QID) | ORAL | 0 refills | Status: DC | PRN
Start: 2016-07-25 — End: 2016-08-21

## 2016-07-25 MED ORDER — POLYETHYLENE GLYCOL 3350 17 G PO PACK: 17.0000 g | PACK | Freq: Every day | ORAL | 0 refills | Status: DC | PRN
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
## 2016-07-25 NOTE — Progress Notes (Signed)
Went to patient's room to go over d/c papers with her. Patient states she is not ready for discharge today . MD notified,social worker,case manager,AC and charge nurse also notified

## 2016-07-25 NOTE — Progress Notes (Signed)
Nursing Note: Pt and her belongings ,pt in wheelchair and accompanied by staff to her ride per [email or web address]

## 2016-07-25 NOTE — Progress Notes (Signed)
PHARMACY - ADULT TOTAL PARENTERAL NUTRITION CONSULT NOTE   Pharmacy Consult for TPN Indication: malnutrition  Patient Measurements: Height: 5' (152.4 cm) Weight: 101 lb 6.4 oz (46 kg) IBW/kg (Calculated) : 45.5 TPN AdjBW (KG): 46 Body mass index is 19.8 kg/m. 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 Appears to be issues with noncompliance to pureed diet and inability to afford supplements  Significant events:   3/30: Current plan is for discharge 3/31 on home TPN at 60 ml/hr to improve nutrition prior to surgery which is scheduled for April 12 currently.  Today:   Glucose - < 150gm/dl, serum glucose 55 at 0500, CBG 98 at 0800  Electrolytes - Cl sl high, bicarb wnl, all others WNL; K/Mg improved after supplementation  Renal - stable WNL  LFTs - WNL  TGs - wnl  Prealbumin - moderately low at 12.2  NUTRITIONAL GOALS                                                                                             RD recs:  Kcal:  1500-1700 Protein:  65-75g Fluid:  1.7L/day   Taking Boost x 4 yesterday but no Unjury = 1000 kcal, 36g protein  Clinimix 5/15 at 30 ml/hr plus IV lipids at 240 ml/day provides 36 g protein and 991 kcal/day, which meets protein and exceeds caloric needs considering above supplement intake  PLAN                                                                                                                     Discharge home today on TPN per [place] complete for home health  Minda Ditto PharmD Pager  [phone]  07/25/2016, 2:53 PM

## 2016-07-25 NOTE — Progress Notes (Signed)
NCM spoke to pt at bedside. States she will be able to dc home this evening at 9 pm. Her boyfriend will be available to pick her up. Contacted [facility] to make aware and they will arrange [facility] RN to come in am to set up TPN. HH RN was scheduled to come this evening but does not come after 6 pm. Jonnie Finner RN CCM Case Mgmt phone [phone]

## 2016-07-25 NOTE — Progress Notes (Signed)
Pt says her ridw will be here at about 10 :15 tonight.Thx

## 2016-07-25 NOTE — Care Management Important Message (Signed)
Important Message  Patient Details  Name: Angie [name] MRN: [id number] Date of Birth: 09/16/1965   Medicare Important Message Given:  Yes    Erenest Rasher, RN 07/25/2016, 5:37 PM

## 2016-07-25 NOTE — Progress Notes (Signed)
Patient Demographics:    Angie [name], is a 51 y.o. female, DOB - March 17, 1966, NUU:[id number]  Admit date - 07/21/2016   Admitting Physician Charlynne Cousins, MD  Outpatient Primary MD for the patient is Ricke Hey, MD  LOS - 4   No chief complaint on file.       Subjective:    Angie [name] today has no fevers, no emesis,  No chest pain,  Tolerating liquid diet well   Assessment  & Plan :    Principal Problem:   Protein-calorie malnutrition, severe Active Problems:   S/P gastric bypass   Abdominal pain, chronic, epigastric   Gastrojejunal anastomotic stricture   Alcohol use   Chronic pain syndrome   Anastomotic ulcer   Abdominal pain  Brief Summary:- 51 yo F with hx gastric bypass &chronic gastrojejunal stricture refractory to serial dilations and leading to malnutrition / significant weight weight loss over last 6 months. Appears to be issues with noncompliance withpureed diet and also unable to afford supplements, : Current plan is for discharge on 07/25/16 on home TPN at 30 ml/hrto improve nutrition prior to surgery which is scheduled for April 12 currently.   Plan:- 1)Abdominal Pain with an Anastomotic Ulcer and Gastrojejunal Stricture- overall Improved, continue PPI, oral intake as tolerated, She denies any further melanotic, black or bloody stools.  Avoid NSAID use. She had a previous CT scan on December 2017 that show mild distention of the colon with some wall thickening and inflammatory changes, according to her gastroenterologist her opening at her stricture is sufficient that she should be able to tolerate a liquid diet. Hgb is stable   2)Severe Protein calorie malnutrition secondary to stricture and Chronic Abdominal pain- liquid diet as tolerated, advance diet as tolerated.  Previously was not compliant  withpureed diet. discharge 07/25/16 on home TPN at 30 ml/hrto  improve nutrition prior to surgery which is scheduled for April 12 currently. Follow vitamin D levels and electrolytes as outpatient  prior to surgery.  3)Dispo- care discussed with social worker, patient has now decided to go back home with his significant other, she does not want to move in with his son at this time . 1)Discharge Home with [place]...2)TPN (CLiniMix E) via Rt Arm Picc Line at 30 ml/hr continuosly, 3)Fat Emulsion 20 %.... Run in 240 ml over 12 hours at 20 ml/hr ONLY   4)Chronic pain syndrome: Continue narcotics for pain.   1)Discharge Home with [place].Marland KitchenMarland Kitchen 2)TPN (CLiniMix E) via Rt Arm Picc Line at 30 ml/hr continuosly 3)Fat Emulsion 20 %.... Run in 240 ml over 12 hours at 20 ml/hr ONLY  Discharge Condition: improved  DVT prophylaxis:SCDs Code Status:Full Family Communication:Patient comprehends well Disposition Plan: home in am     Consultants:  Gastroenterology  Procedures:  PICC line placed on 07/21/2016   DVT prophylaxis:SCDs Code Status:Full Family Communication:Patient comprehends well Disposition Plan: home in am     Consultants:  Gastroenterology  Procedures:  PICC line placed on 07/21/2016  Lab Results  Component Value Date   PLT 400 07/22/2016    Inpatient Medications  Scheduled Meds: . ALPRAZolam  1 mg Oral TID  . feeding supplement  1 Container Oral QID  . insulin aspart  0-9 Units Subcutaneous TID AC & HS  . lidocaine  1 patch Transdermal Daily  . pantoprazole (PROTONIX) IV  40 mg Intravenous Q12H  . QUEtiapine  200 mg Oral QHS  . sodium chloride flush  10-40 mL Intracatheter Q12H   Continuous Infusions: . Marland KitchenTPN (CLINIMIX-E) Adult 30 mL/hr at 07/24/16 1859   PRN Meds:.ondansetron, [DISCONTINUED] oxyCODONE-acetaminophen **AND** oxyCODONE, polyethylene glycol, sodium chloride flush    Anti-infectives    None        Objective:   Vitals:   07/24/16 0453  07/24/16 1430 07/24/16 2108 07/25/16 0547  BP: 106/71 114/69 (!) 92/56 107/66  Pulse: 92 100 (!) 102 100  Resp: 16 16 16 16   Temp: 98.6 F (37 C) 98.5 F (36.9 C) 98.2 F (36.8 C) 97.8 F (36.6 C)  TempSrc: Oral Oral Oral Oral  SpO2: 99% 100% 100% 100%  Weight:      Height:        Wt Readings from Last 3 Encounters:  07/21/16 46 kg (101 lb 6.4 oz)  07/21/16 48.5 kg (107 lb)  05/27/16 48.5 kg (107 lb)     Intake/Output Summary (Last 24 hours) at 07/25/16 1415 Last data filed at 07/25/16 0610  Gross per 24 hour  Intake             1280 ml  Output             2600 ml  Net            -1320 ml     Physical Exam  Gen:- Awake Alert,  In no apparent distress  HEENT:- [place].AT, No sclera icterus Neck-Supple Neck,No JVD,.  Lungs-  CTAB  CV- S1, S2 normal Abd-  +ve B.Sounds, Abd Soft, No tenderness,    Extremity/Skin:- No  edema,   Rt Arm Picc Line    Data Review:   Micro Results No results found for this or any previous visit (from the past 240 hour(s)).  Radiology Reports No results found.   CBC  Recent Labs Lab 07/21/16 1429 07/22/16 0407  WBC 3.9* 4.4  HGB 10.8* 9.5*  HCT 31.4* 27.9*  PLT 484* 400  MCV 88.7 93.0  MCH 30.5 31.7  MCHC 34.4 34.1  RDW 11.9 12.3  LYMPHSABS 1.3 1.9  MONOABS 0.3 0.3  EOSABS 0.0 0.1  BASOSABS 0.0 0.0    Chemistries   Recent Labs Lab 07/21/16 1429 07/22/16 0407 07/23/16 0520 07/24/16 0407 07/25/16 0500  NA 141 141 137 136 140  K 3.5 3.0* 4.7 4.7 4.0  CL 111 115* 114* 113* 114*  CO2 20* 17* 18* 18* 23  GLUCOSE 72 181* 126* 80 55*  BUN 10 6 <5* 5* 8  CREATININE 0.57 0.63 0.70 0.65 0.50  CALCIUM 9.2 8.5* 8.7* 8.5* 8.7*  MG  --  1.6* 2.2 1.7 1.4*  AST 15 21  --   --   --   ALT 12* 11*  --   --   --   ALKPHOS 80 59  --   --   --   BILITOT 0.5 0.4  --   --   --    ------------------------------------------------------------------------------------------------------------------ No results for input(s): CHOL, HDL,  LDLCALC, TRIG, CHOLHDL, LDLDIRECT in the last 72 hours.  Lab Results  Component Value Date   HGBA1C 4.8 04/25/2016   ------------------------------------------------------------------------------------------------------------------ No results for input(s): TSH, T4TOTAL, T3FREE, THYROIDAB in the last 72 hours.  Invalid input(s): FREET3 ------------------------------------------------------------------------------------------------------------------  Recent Labs  07/23/16 0816  VITAMINB12 334  FOLATE 8.0  FERRITIN 86  TIBC 202*  IRON 45    Coagulation profile No results for input(s): INR, PROTIME in the last 168 hours.  No results for input(s): DDIMER in the last 72 hours.  Cardiac Enzymes No results for input(s): CKMB, TROPONINI, MYOGLOBIN in the last 168 hours.  Invalid input(s): CK ------------------------------------------------------------------------------------------------------------------ No results found for: BNP   Delford Wingert M.D on 07/25/2016 at 2:15 PM  Between 7am to 7pm - Pager - [phone]  After 7pm go to [email or web address] - password [email or web address]  [facility] -  Office  [phone]  Dragon dictation system was used to create this note, attempts have been made to correct errors, however presence of uncorrected errors is not a reflection quality of care provided

## 2016-07-25 NOTE — Discharge Instructions (Signed)
1)Discharge Home with [place].Marland KitchenMarland Kitchen 2)TPN (CLiniMix E) via Rt Arm Picc Line at 30 ml/hr continuosly 3)Fat Emulsion 20 %.... Run in 240 ml over 12 hours at 20 ml/hr ONLY 4)Discharge on 07/25/16 on home TPN at 60 ml/hrto improve nutrition prior to surgery which is scheduled for April 12 currently.

## 2016-07-25 NOTE — Progress Notes (Signed)
Reviewed discharge insrtuctions and pt able to verbalize understanding via teach-back method.Reviewed care and assessment of picc line.Pt able to verbalize sx of infection and to report to MD immed.Pt able to recall each med and what they are for when reading her d/c instructions.Pt lactose intolerant and understands to take ensure clear.Pt aware that [facility] will see her in the am to connect her TPN.Pt given discharge instructions in envelope.Pt able to verbalize her f/u appt w/ MD and pt encouraged to take her envelope with her discharge instructions.wbb

## 2016-07-25 NOTE — Discharge Summary (Addendum)
Angie [name], is a 51 y.o. female  DOB Sep 07, 1965  MRN [id number].  Admission date:  07/21/2016  Admitting Physician  Charlynne Cousins, MD  Discharge Date:  07/25/2016   Primary MD  Ricke Hey, MD  Recommendations for primary care physician for things to follow:   [place] with [place].Marland KitchenMarland Kitchen 2)TPN (CLiniMix E) via Rt Arm Picc Line at 30 ml/hr continuosly 3)Fat Emulsion 20 %.... Run in 240 ml over 12 hours at 20 ml/hr ONLY  Admission Diagnosis  Abd pain weight loss   Discharge Diagnosis  Abd pain weight loss  /severe protein caloric malnutrition  Principal Problem:   Protein-calorie malnutrition, severe Active Problems:   S/P gastric bypass   Abdominal pain, chronic, epigastric   Gastrojejunal anastomotic stricture   Alcohol use   Chronic pain syndrome   Anastomotic ulcer   Abdominal pain      Past Medical History:  Diagnosis Date  . Abnormal Pap smear   . Alcohol abuse   . Anemia    IDA  . Anxiety    Takes xanax  . Arthritis    "qwhere" (04/26/2016)  . Bipolar disorder ([place])   . Blood transfusion without reported diagnosis   . Bulging lumbar disc   . Chronic lower back pain   . Depression   . Fibromyalgia   . Headache    "weekly @ least" (04/26/2016)  . Hypertension   . Lactose intolerance in adult 2017  . Migraines    "weekly @ least" (04/26/2016)  . Seizures ([place])    last one Dec. 2017  . Type II diabetes mellitus ([place])    "before the gastric bypass" (04/26/2016)    Past Surgical History:  Procedure Laterality Date  . BALLOON DILATION N/A 05/27/2016   Procedure: BALLOON DILATION;  Surgeon: Doran Stabler, MD;  Location: [facility];  Service: Gastroenterology;  Laterality: N/A;  . CERVICAL CONE BIOPSY    . [place]; 1996; 1998; 2014  . CESAREAN SECTION WITH BILATERAL TUBAL LIGATION Bilateral 10/28/2012   Procedure: Repeat  cesarean section with delivery of baby boy at 102. Apgars 8/9.  BILATERAL TUBAL LIGATION;  Surgeon: Florian Buff, MD;  Location: [facility];  Service: Obstetrics;  Laterality: Bilateral;  . DILATION AND CURETTAGE OF UTERUS    . ESOPHAGOGASTRODUODENOSCOPY N/A 05/27/2016   Procedure: ESOPHAGOGASTRODUODENOSCOPY (EGD);  Surgeon: Doran Stabler, MD;  Location: [facility];  Service: Gastroenterology;  Laterality: N/A;  . ESOPHAGOGASTRODUODENOSCOPY (EGD) WITH PROPOFOL N/A 04/23/2016   Procedure: ESOPHAGOGASTRODUODENOSCOPY (EGD) WITH PROPOFOL;  Surgeon: Doran Stabler, MD;  Location: [facility];  Service: Endoscopy;  Laterality: N/A;  . ROUX-EN-Y GASTRIC BYPASS  2007  . TUBAL LIGATION  2014       HPI  from the history and physical done on the day of admission:    Chief Complaint: Abdominal pain  HPI: Angie [name] is a 51 y.o. female with medical history significant of gastric bypass status post multiple EGDs for anastomotic  strictures, essential hypertension and chronic abdominal pain on on chronic make NSAID use who had an EGD in January and February for stricture at the bypass site was seen by her gastroenterologist today again and perform another the EGD did show significant stricture with an anastomotic ulcer she had not had any melanotic stools or black stools. But he was concerned about her weight loss. As she is cachectic her weight on 07/20/2014 was 74 kg on the day of admission she is 48.5. And he recommended admission for evaluation and a nutritional consult.    Hospital Course:   Brief Summary:- 51 yo F with hx gastric bypass &chronic gastrojejunal stricture refractory to serial dilations and leading to malnutrition / significant weight weight loss over last 6 months. Appears to be issues with noncompliance with pureed diet and also unable  to afford supplements, : Current plan is for discharge on 07/25/16 on home TPN at 30 ml/hrto improve nutrition prior to surgery which is  scheduled for April 12 currently.   Plan:- 1)Abdominal Pain with an Anastomotic Ulcer and Gastrojejunal Stricture- overall Improved, continue PPI, oral intake as tolerated, She denies any further melanotic, black or bloody stools.  Avoid NSAID use. She had a previous CT scan on December 2017 that show mild distention of the colon with some wall thickening and inflammatory changes, according to her gastroenterologist her opening at her stricture is sufficient that she should be able to tolerate a liquid diet. Hgb is stable  2)Severe Protein calorie malnutrition secondary to stricture and Chronic Abdominal pain- liquid diet as tolerated, advance diet as tolerated.  Previously was not compliant  with pureed diet. discharge 07/25/16 on home TPN at 30 ml/hrto improve nutrition prior to surgery which is scheduled for April 12 currently. Follow vitamin D levels and electrolytes as outpatient  prior to surgery.  3)Dispo- care discussed with social worker, patient has now decided to go back home with his significant other, she does not want to move in with his son at this time . 1)Discharge Home with [place]...2)TPN (CLiniMix E) via Rt Arm Picc Line at 30 ml/hr continuosly, 3)Fat Emulsion 20 %.... Run in 240 ml over 12 hours at 20 ml/hr ONLY  4)Chronic pain syndrome: Continue narcotics for pain.   1)Discharge Home with [place].Marland KitchenMarland Kitchen 2)TPN (CLiniMix E) via Rt Arm Picc Line at 30 ml/hr continuosly 3)Fat Emulsion 20 %.... Run in 240 ml over 12 hours at 20 ml/hr ONLY  Discharge Condition: improved  Procedures:  PICC line placed on 07/21/2016  Follow UP  Follow-up Information    Ricke Hey, MD. Schedule an appointment as soon as possible for a visit in 1 week(s).   Specialty:  Family Medicine Contact information: [place] [address] [phone]        [facility] [place] Follow up.   Why:  Home Health RN, pharmacy will  deliver TPN Contact information: [address] [place] [address] [phone]            Consults obtained - Gi/Surgery  Diet and Activity recommendation:  As advised  Discharge Instructions    * Discharge Instructions    Call MD for:  difficulty breathing, headache or visual disturbances    Complete by:  As directed    Call MD for:  persistant dizziness or light-headedness    Complete by:  As directed    Call MD for:  persistant nausea and vomiting    Complete  by:  As directed    Call MD for:  severe uncontrolled pain    Complete by:  As directed    Call MD for:  temperature >100.4    Complete by:  As directed    Diet - low sodium heart healthy    Complete by:  As directed    Discharge instructions    Complete by:  As directed    [place] with [place].Marland KitchenMarland Kitchen 2)TPN (CLiniMix E) via Rt Arm Picc Line at 30 ml/hr continuosly 3)Fat Emulsion 20 %.... Run in 240 ml over 12 hours at 20 ml/hr ONLY 4)Discharge on 07/25/16 on home TPN at 60 ml/hrto improve nutrition prior to surgery which is scheduled for April 12 currently.   Increase activity slowly    Complete by:  As directed         Discharge Medications     Allergies as of 07/25/2016      Reactions   Iron    Had acute allergy with tachycardia, attention and generalized itching shortly after receiving nulecit ( iron gluconate) infusion.      Medication List    STOP taking these medications   ibuprofen 200 MG tablet Commonly known as:  ADVIL,MOTRIN     TAKE these medications   ALPRAZolam 1 MG tablet Commonly known as:  XANAX Take 1 mg by mouth 3 (three) times daily.   ENSURE CLEAR Liqd Take 1 Bottle by mouth 4 (four) times daily.   GAS-X PO Take 1 capsule by mouth as needed.   hydrOXYzine 25 MG capsule Commonly known as:  VISTARIL Take 25 mg by mouth 3 (three) times daily.   lidocaine 5 % ointment Commonly known as:  XYLOCAINE Apply 1 application topically 3 (three)  times daily as needed for pain.   ondansetron 4 MG disintegrating tablet Commonly known as:  ZOFRAN ODT Take 1 tablet (4 mg total) by mouth every 8 (eight) hours as needed for nausea.   oxyCODONE-acetaminophen 10-325 MG tablet Commonly known as:  PERCOCET Take 1 tablet by mouth every 6 (six) hours as needed for pain. What changed:  when to take this   polyethylene glycol packet Commonly known as:  MIRALAX / GLYCOLAX Take 17 g by mouth daily as needed for mild constipation.   pregabalin 50 MG capsule Commonly known as:  LYRICA Take 50 mg by mouth 3 (three) times daily.   QUEtiapine 100 MG tablet Commonly known as:  SEROQUEL Take 1 tablet (100 mg total) by mouth at bedtime. What changed:  medication strength  how much to take       Major procedures and Radiology Reports - PLEASE review detailed and final reports for all details, in brief -   No results found.  Micro Results     No results found for this or any previous visit (from the past 240 hour(s)).  Today   Subjective    Angie [name] today has no new concerns, no emesis, No fever  Or chills           Patient has been seen and examined prior to discharge   Objective   Blood pressure 107/66, pulse 100, temperature 97.8 F (36.6 C), temperature source Oral, resp. rate 16, height 5' (1.524 m), weight 46 kg (101 lb 6.4 oz), SpO2 100 %.   Intake/Output Summary (Last 24 hours) at 07/25/16 1409 Last data filed at 07/25/16 0610  Gross per 24 hour  Intake  1280 ml  Output             2600 ml  Net            -1320 ml    Exam Gen:- Awake  In no apparent distress  HEENT:- [place].AT,   Neck-Supple Neck,No JVD,  Lungs- mostly clear  CV- S1, S2 normal Abd-  +ve B.Sounds, Abd Soft, No tenderness,    Extremity/Skin:- Intact peripheral pulses  , Rt Arm Picc Line Site is C/D/I    Data Review   CBC w Diff:  Lab Results  Component Value Date   WBC 4.4 07/22/2016   HGB 9.5 (L) 07/22/2016   HCT 27.9  (L) 07/22/2016   PLT 400 07/22/2016   LYMPHOPCT 44 07/22/2016   MONOPCT 6 07/22/2016   EOSPCT 1 07/22/2016   BASOPCT 1 07/22/2016    CMP:  Lab Results  Component Value Date   NA 140 07/25/2016   K 4.0 07/25/2016   CL 114 (H) 07/25/2016   CO2 23 07/25/2016   BUN 8 07/25/2016   CREATININE 0.50 07/25/2016   PROT 6.2 (L) 07/22/2016   ALBUMIN 3.2 (L) 07/22/2016   BILITOT 0.4 07/22/2016   ALKPHOS 59 07/22/2016   AST 21 07/22/2016   ALT 11 (L) 07/22/2016  .   Total Discharge time is about 33 minutes  Angie [name] M.D on 07/25/2016 at 2:09 PM  [facility]   Office  [phone]  Dragon dictation system was used to create this note, attempts have been made to correct errors, however presence of uncorrected errors is not a reflection quality of care provided

## 2016-07-25 NOTE — Progress Notes (Signed)
Contacted [place] Liaison to make aware of dc home today with TPN. Jonnie Finner RN CCM Case Mgmt phone [phone]

## 2016-07-27 ENCOUNTER — Telehealth: Payer: Self-pay

## 2016-07-27 NOTE — Telephone Encounter (Signed)
[place] and left a message for Charlynn Grimes, at [phone]. Let her know that looking at the discharge instructions patient's primary care is to follow TPN order and any home health orders. Faxed back to pharmacy with discharge summary.

## 2016-08-02 ENCOUNTER — Telehealth: Payer: Self-pay | Admitting: Gastroenterology

## 2016-08-02 NOTE — Telephone Encounter (Signed)
Spoke to patient, she has an appointment on 4/12 with the surgeon. I told her to call their office re: surgery date, I do not schedule for them. I did let her know that most likely it will not be scheduled until the day of her visit or afterwards, but she could certainly talk to them about when they think it will be scheduled.

## 2016-08-03 ENCOUNTER — Telehealth: Payer: Self-pay | Admitting: Gastroenterology

## 2016-08-03 NOTE — Telephone Encounter (Signed)
Spoke to [employer], told her that per the discharge instructions all TPN care and labs have been directed to patient's PCP. She will contact their office.

## 2016-08-05 ENCOUNTER — Ambulatory Visit: Payer: Self-pay | Admitting: General Surgery

## 2016-08-10 NOTE — Patient Instructions (Addendum)
Asaiah D Schwandt  08/10/2016   Your procedure is scheduled on: 08-16-16   Report to [facility] to 3rd floor to [place] at 1115 AM.    Call this number if you have problems the morning of surgery [phone]    Remember: ONLY 1 PERSON MAY GO WITH YOU TO SHORT STAY TO GET  READY MORNING OF [place].  Do not eat food or drink liquids After Midnight. You may have a clear liquid from Midnight until 0700 AM. Then nothing by Mouth.    CLEAR LIQUID DIET   Foods Allowed                                                                     Foods Excluded  Coffee and tea, regular and decaf                             liquids that you cannot  Plain Jell-O in any flavor                                             see through such as: Fruit ices (not with fruit pulp)                                     milk, soups, orange juice  Iced Popsicles                                    All solid food Carbonated beverages, regular and diet                                    Cranberry, grape and apple juices Sports drinks like Gatorade Lightly seasoned clear broth or consume(fat free) Sugar, honey syrup  Sample Menu Breakfast                                Lunch                                     Supper Cranberry juice                    Beef broth                            Chicken broth Jell-O                                     Grape juice  Apple juice Coffee or tea                        Jell-O                                      Popsicle                                                Coffee or tea                        Coffee or tea  _____________________________________________________________________  .     Take these medicines the morning of surgery with A SIP OF WATER: Famotidine (Pepcid). Zyrtec, Xanax and Oxycodone if needed.  You may also bring and  use your nasal spray               NO  DIABETIC MEDICATIONS AM OF SURGERY.                                 You may not have any metal on your body including hair pins and              piercings  Do not wear jewelry, make-up, lotions, powders or perfumes, deodorant             Do not wear nail polish.  Do not shave  48 hours prior to surgery.            Do not bring valuables to the hospital. [place].  Contacts, dentures or bridgework may not be worn into surgery.  Leave suitcase in the car. After surgery it may be brought to your room.                  Please read over the following fact sheets you were given: _____________________________________________________________________  How to Manage Your Diabetes Before and After Surgery  Why is it important to control my blood sugar before and after surgery? . Improving blood sugar levels before and after surgery helps healing and can limit problems. . A way of improving blood sugar control is eating a healthy diet by: o  Eating less sugar and carbohydrates o  Increasing activity/exercise o  Talking with your doctor about reaching your blood sugar goals . High blood sugars (greater than 180 mg/dL) can raise your risk of infections and slow your recovery, so you will need to focus on controlling your diabetes during the weeks before surgery. . Make sure that the doctor who takes care of your diabetes knows about your planned surgery including the date and location.  How do I manage my blood sugar before surgery? . Check your blood sugar at least 4 times a day, starting 2 days before surgery, to make sure that the level is not too high or low. o Check your blood sugar the morning of your surgery when you wake up and every 2 hours until you get to the Short Stay unit. . If your blood sugar is less than 70 mg/dL, you will  need to treat for low blood sugar: o Do not take insulin. o Treat a low blood sugar (less than 70 mg/dL)  with  cup of clear juice (cranberry or apple), 4 glucose tablets, OR glucose gel. o Recheck blood sugar in 15 minutes after treatment (to make sure it is greater than 70 mg/dL). If your blood sugar is not greater than 70 mg/dL on recheck, call [phone] for further instructions. . Report your blood sugar to the short stay nurse when you get to Short Stay.  . If you are admitted to the hospital after surgery: o Your blood sugar will be checked by the staff and you will probably be given insulin after surgery (instead of oral diabetes medicines) to make sure you have good blood sugar levels. o The goal for blood sugar control after surgery is 80-180 mg/dL.   WHAT DO I DO ABOUT MY DIABETES MEDICATION?  Marland Kitchen Do not take oral diabetes medicines (pills) the morning of surgery.   Reviewed and Endorsed by [facility] Patient Education Committee, August 2015[facility] - Preparing for Surgery Before surgery, you can play an important role.  Because skin is not sterile, your skin needs to be as free of germs as possible.  You can reduce the number of germs on your skin by washing with CHG (chlorahexidine gluconate) soap before surgery.  CHG is an antiseptic cleaner which kills germs and bonds with the skin to continue killing germs even after washing. Please DO NOT use if you have an allergy to CHG or antibacterial soaps.  If your skin becomes reddened/irritated stop using the CHG and inform your nurse when you arrive at Short Stay. Do not shave (including legs and underarms) for at least 48 hours prior to the first CHG shower.  You may shave your face/neck. Please follow these instructions carefully:  1.  Shower with CHG Soap the night before surgery and the  morning of Surgery.  2.  If you choose to wash your hair, wash your hair first as usual with your  normal  shampoo.  3.  After you shampoo, rinse your hair and body thoroughly to remove the  shampoo.                           4.  Use CHG as you  would any other liquid soap.  You can apply chg directly  to the skin and wash                       Gently with a scrungie or clean washcloth.  5.  Apply the CHG Soap to your body ONLY FROM THE NECK DOWN.   Do not use on face/ open                           Wound or open sores. Avoid contact with eyes, ears mouth and genitals (private parts).                       Wash face,  Genitals (private parts) with your normal soap.             6.  Wash thoroughly, paying special attention to the area where your surgery  will be performed.  7.  Thoroughly rinse your body with warm water from the neck down.  8.  DO NOT shower/wash with your normal soap after using  and rinsing off  the CHG Soap.                9.  Pat yourself dry with a clean towel.            10.  Wear clean pajamas.            11.  Place clean sheets on your bed the night of your first shower and do not  sleep with pets. Day of Surgery : Do not apply any lotions/deodorants the morning of surgery.  Please wear clean clothes to the hospital/surgery center.  FAILURE TO FOLLOW THESE INSTRUCTIONS MAY RESULT IN THE CANCELLATION OF YOUR SURGERY PATIENT SIGNATURE_________________________________  NURSE SIGNATURE__________________________________  ________________________________________________________________________

## 2016-08-10 NOTE — Progress Notes (Signed)
04-22-16 (EPIC) EKG 04-23-16 (EPIC) ECHO

## 2016-08-12 ENCOUNTER — Encounter (HOSPITAL_COMMUNITY)
Admission: RE | Admit: 2016-08-12 | Discharge: 2016-08-12 | Disposition: A | Discharge status: Home / Self Care | Payer: Medicare Other | Source: Ambulatory Visit | Attending: General Surgery | Admitting: General Surgery

## 2016-08-12 ENCOUNTER — Encounter (HOSPITAL_COMMUNITY): Payer: Self-pay

## 2016-08-12 DIAGNOSIS — Z01812 Encounter for preprocedural laboratory examination: Secondary | ICD-10-CM | POA: Diagnosis present

## 2016-08-12 DIAGNOSIS — Z9884 Bariatric surgery status: Secondary | ICD-10-CM | POA: Insufficient documentation

## 2016-08-12 DIAGNOSIS — E46 Unspecified protein-calorie malnutrition: Secondary | ICD-10-CM | POA: Insufficient documentation

## 2016-08-12 LAB — COMPREHENSIVE METABOLIC PANEL
ALT: 33 U/L (ref 14–54)
AST: 36 U/L (ref 15–41)
Albumin: 4.1 g/dL (ref 3.5–5.0)
Alkaline Phosphatase: 66 U/L (ref 38–126)
Anion gap: 5 (ref 5–15)
BUN: 10 mg/dL (ref 6–20)
CO2: 23 mmol/L (ref 22–32)
Calcium: 9.3 mg/dL (ref 8.9–10.3)
Chloride: 110 mmol/L (ref 101–111)
Creatinine, Ser: 0.61 mg/dL (ref 0.44–1.00)
GFR calc Af Amer: >60 mL/min (ref >60)
GFR calc non Af Amer: >60 mL/min (ref >60)
Glucose, Bld: 96 mg/dL (ref 65–99)
Potassium: 5.4 mmol/L — ABNORMAL HIGH (ref 3.5–5.1)
Sodium: 138 mmol/L (ref 135–145)
Total Bilirubin: 0.2 mg/dL — ABNORMAL LOW (ref 0.3–1.2)
Total Protein: 7.2 g/dL (ref 6.5–8.1)

## 2016-08-12 LAB — CBC WITH DIFFERENTIAL/PLATELET
Basophils Absolute: 0 10*3/uL (ref 0.0–0.1)
Basophils Relative: 1 %
Eosinophils Absolute: 0.1 10*3/uL (ref 0.0–0.7)
Eosinophils Relative: 3 %
HCT: 28 % — ABNORMAL LOW (ref 36.0–46.0)
Hemoglobin: 9.2 g/dL — ABNORMAL LOW (ref 12.0–15.0)
Lymphocytes Relative: 30 %
Lymphs Abs: 1.7 10*3/uL (ref 0.7–4.0)
MCH: 31.1 pg (ref 26.0–34.0)
MCHC: 32.9 g/dL (ref 30.0–36.0)
MCV: 94.6 fL (ref 78.0–100.0)
Monocytes Absolute: 0.3 10*3/uL (ref 0.1–1.0)
Monocytes Relative: 6 %
Neutro Abs: 3.5 10*3/uL (ref 1.7–7.7)
Neutrophils Relative %: 62 %
Platelets: 402 10*3/uL — ABNORMAL HIGH (ref 150–400)
RBC: 2.96 MIL/uL — ABNORMAL LOW (ref 3.87–5.11)
RDW: 13.8 % (ref 11.5–15.5)
WBC: 5.7 10*3/uL (ref 4.0–10.5)

## 2016-08-12 LAB — APTT: aPTT: 29 s (ref 24–36)

## 2016-08-12 LAB — PROTIME-INR
INR: 0.89
Prothrombin Time: 12 seconds (ref 11.4–15.2)

## 2016-08-12 LAB — GLUCOSE, CAPILLARY: Glucose-Capillary: 78 mg/dL (ref 65–99)

## 2016-08-12 NOTE — Progress Notes (Signed)
08-12-16 CMP and CBC results routed to Dr. Kieth Brightly for review.
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
## 2016-08-13 LAB — HEMOGLOBIN A1C
Hgb A1c MFr Bld: 4.9 % (ref 4.8–5.6)
Mean Plasma Glucose: 94 mg/dL

## 2016-08-16 ENCOUNTER — Inpatient Hospital Stay (HOSPITAL_COMMUNITY)
Admission: AD | Admit: 2016-08-16 | Discharge: 2016-08-21 | DRG: 641 | Disposition: A | Discharge status: Skilled Nursing Facility | Payer: Medicare Other | Source: Ambulatory Visit | Attending: General Surgery | Admitting: General Surgery

## 2016-08-16 ENCOUNTER — Ambulatory Visit (HOSPITAL_COMMUNITY): Payer: Medicare Other | Admitting: Anesthesiology

## 2016-08-16 ENCOUNTER — Encounter (HOSPITAL_COMMUNITY): Payer: Self-pay | Admitting: Anesthesiology

## 2016-08-16 ENCOUNTER — Encounter (HOSPITAL_COMMUNITY)
Admission: AD | Disposition: A | Discharge status: Skilled Nursing Facility | Payer: Self-pay | Source: Ambulatory Visit | Attending: General Surgery

## 2016-08-16 DIAGNOSIS — E739 Lactose intolerance, unspecified: Secondary | ICD-10-CM | POA: Diagnosis present

## 2016-08-16 DIAGNOSIS — Z87891 Personal history of nicotine dependence: Secondary | ICD-10-CM

## 2016-08-16 DIAGNOSIS — F319 Bipolar disorder, unspecified: Secondary | ICD-10-CM | POA: Diagnosis present

## 2016-08-16 DIAGNOSIS — Z8249 Family history of ischemic heart disease and other diseases of the circulatory system: Secondary | ICD-10-CM

## 2016-08-16 DIAGNOSIS — Z818 Family history of other mental and behavioral disorders: Secondary | ICD-10-CM

## 2016-08-16 DIAGNOSIS — K660 Peritoneal adhesions (postprocedural) (postinfection): Secondary | ICD-10-CM | POA: Diagnosis present

## 2016-08-16 DIAGNOSIS — Z9111 Patient's noncompliance with dietary regimen: Secondary | ICD-10-CM

## 2016-08-16 DIAGNOSIS — K289 Gastrojejunal ulcer, unspecified as acute or chronic, without hemorrhage or perforation: Secondary | ICD-10-CM | POA: Diagnosis present

## 2016-08-16 DIAGNOSIS — Z833 Family history of diabetes mellitus: Secondary | ICD-10-CM

## 2016-08-16 DIAGNOSIS — M797 Fibromyalgia: Secondary | ICD-10-CM | POA: Diagnosis present

## 2016-08-16 DIAGNOSIS — R64 Cachexia: Secondary | ICD-10-CM | POA: Diagnosis present

## 2016-08-16 DIAGNOSIS — Z9119 Patient's noncompliance with other medical treatment and regimen: Secondary | ICD-10-CM

## 2016-08-16 DIAGNOSIS — E43 Unspecified severe protein-calorie malnutrition: Secondary | ICD-10-CM

## 2016-08-16 DIAGNOSIS — K3189 Other diseases of stomach and duodenum: Secondary | ICD-10-CM | POA: Diagnosis present

## 2016-08-16 DIAGNOSIS — Z9884 Bariatric surgery status: Secondary | ICD-10-CM

## 2016-08-16 DIAGNOSIS — Z79891 Long term (current) use of opiate analgesic: Secondary | ICD-10-CM

## 2016-08-16 DIAGNOSIS — G894 Chronic pain syndrome: Secondary | ICD-10-CM | POA: Diagnosis present

## 2016-08-16 DIAGNOSIS — Z6821 Body mass index (BMI) 21.0-21.9, adult: Secondary | ICD-10-CM

## 2016-08-16 DIAGNOSIS — I10 Essential (primary) hypertension: Secondary | ICD-10-CM | POA: Diagnosis present

## 2016-08-16 DIAGNOSIS — Z800 Family history of malignant neoplasm of digestive organs: Secondary | ICD-10-CM

## 2016-08-16 DIAGNOSIS — K912 Postsurgical malabsorption, not elsewhere classified: Secondary | ICD-10-CM | POA: Diagnosis present

## 2016-08-16 DIAGNOSIS — E46 Unspecified protein-calorie malnutrition: Secondary | ICD-10-CM | POA: Diagnosis present

## 2016-08-16 HISTORY — PX: GASTROSTOMY: SHX5249

## 2016-08-16 LAB — CREATININE, SERUM
Creatinine, Ser: 0.66 mg/dL (ref 0.44–1.00)
GFR calc Af Amer: >60 mL/min (ref >60)
GFR calc non Af Amer: >60 mL/min (ref >60)

## 2016-08-16 LAB — CBC
HCT: 26 % — ABNORMAL LOW (ref 36.0–46.0)
Hemoglobin: 8.6 g/dL — ABNORMAL LOW (ref 12.0–15.0)
MCH: 30.2 pg (ref 26.0–34.0)
MCHC: 33.1 g/dL (ref 30.0–36.0)
MCV: 91.2 fL (ref 78.0–100.0)
Platelets: 339 10*3/uL (ref 150–400)
RBC: 2.85 MIL/uL — ABNORMAL LOW (ref 3.87–5.11)
RDW: 13.4 % (ref 11.5–15.5)
WBC: 7.3 10*3/uL (ref 4.0–10.5)

## 2016-08-16 LAB — GLUCOSE, CAPILLARY
Glucose-Capillary: 148 mg/dL — ABNORMAL HIGH (ref 65–99)
Glucose-Capillary: 60 mg/dL — ABNORMAL LOW (ref 65–99)
Glucose-Capillary: 89 mg/dL (ref 65–99)

## 2016-08-16 SURGERY — INSERTION OF GASTROSTOMY TUBE
Anesthesia: General

## 2016-08-16 MED ORDER — SIMETHICONE 80 MG PO CHEW: 40.0000 mg | CHEWABLE_TABLET | Freq: Four times a day (QID) | ORAL | Status: DC | PRN

## 2016-08-16 MED ORDER — ALBUMIN HUMAN 25 % IV SOLN
INTRAVENOUS | Status: DC | PRN
  Administered 2016-08-16: 16:00:00 via INTRAVENOUS

## 2016-08-16 MED ORDER — SUCCINYLCHOLINE CHLORIDE 200 MG/10ML IV SOSY
PREFILLED_SYRINGE | INTRAVENOUS | Status: AC
  Filled 2016-08-16: qty 10

## 2016-08-16 MED ORDER — ONDANSETRON 4 MG PO TBDP: 4.0000 mg | ORAL_TABLET | Freq: Four times a day (QID) | ORAL | Status: DC | PRN

## 2016-08-16 MED ORDER — MIDAZOLAM HCL 2 MG/2ML IJ SOLN
INTRAMUSCULAR | Status: AC
  Filled 2016-08-16: qty 2

## 2016-08-16 MED ORDER — HEPARIN SODIUM (PORCINE) 5000 UNIT/ML IJ SOLN
5000.0000 [IU] | Freq: Once | INTRAMUSCULAR | Status: AC
  Administered 2016-08-16: 5000 [IU] via SUBCUTANEOUS
  Filled 2016-08-16: qty 1

## 2016-08-16 MED ORDER — JEVITY 1.2 CAL PO LIQD
1000.0000 mL | ORAL | Status: DC
  Administered 2016-08-16: 20 mL/h
  Administered 2016-08-17: 40 mL
  Administered 2016-08-19 – 2016-08-21 (×2): 1000 mL
  Filled 2016-08-16 (×7): qty 1000

## 2016-08-16 MED ORDER — PROPOFOL 10 MG/ML IV BOLUS
INTRAVENOUS | Status: AC
  Filled 2016-08-16: qty 20

## 2016-08-16 MED ORDER — OXYMETAZOLINE HCL 0.05 % NA SOLN
1.0000 | Freq: Two times a day (BID) | NASAL | Status: DC | PRN
  Filled 2016-08-16: qty 15

## 2016-08-16 MED ORDER — BENAZEPRIL HCL 20 MG PO TABS
20.0000 mg | ORAL_TABLET | Freq: Every day | ORAL | Status: DC
  Filled 2016-08-16: qty 1

## 2016-08-16 MED ORDER — FENTANYL CITRATE (PF) 100 MCG/2ML IJ SOLN
25.0000 ug | INTRAMUSCULAR | Status: AC | PRN
  Administered 2016-08-16: 50 ug via INTRAVENOUS
  Administered 2016-08-16 (×3): 25 ug via INTRAVENOUS
  Administered 2016-08-16: 50 ug via INTRAVENOUS
  Administered 2016-08-16: 25 ug via INTRAVENOUS

## 2016-08-16 MED ORDER — KCL IN DEXTROSE-NACL 20-5-0.45 MEQ/L-%-% IV SOLN
INTRAVENOUS | Status: DC
  Administered 2016-08-17 (×2): via INTRAVENOUS
  Administered 2016-08-17: 40 mL/h via INTRAVENOUS
  Filled 2016-08-16 (×4): qty 1000

## 2016-08-16 MED ORDER — ACETAMINOPHEN 650 MG RE SUPP: 650.0000 mg | Freq: Four times a day (QID) | RECTAL | Status: DC | PRN

## 2016-08-16 MED ORDER — FENTANYL CITRATE (PF) 100 MCG/2ML IJ SOLN
25.0000 ug | INTRAMUSCULAR | Status: DC | PRN
  Administered 2016-08-16: 25 ug via INTRAVENOUS
  Filled 2016-08-16: qty 2

## 2016-08-16 MED ORDER — ROCURONIUM BROMIDE 10 MG/ML (PF) SYRINGE
PREFILLED_SYRINGE | INTRAVENOUS | Status: DC | PRN
  Administered 2016-08-16: 30 mg via INTRAVENOUS
  Administered 2016-08-16: 10 mg via INTRAVENOUS

## 2016-08-16 MED ORDER — QUETIAPINE FUMARATE 50 MG PO TABS
100.0000 mg | ORAL_TABLET | Freq: Every day | ORAL | Status: DC
  Administered 2016-08-16 – 2016-08-20 (×5): 100 mg via ORAL
  Filled 2016-08-16: qty 2
  Filled 2016-08-16: qty 4
  Filled 2016-08-16: qty 2
  Filled 2016-08-16 (×2): qty 4

## 2016-08-16 MED ORDER — ROCURONIUM BROMIDE 50 MG/5ML IV SOSY
PREFILLED_SYRINGE | INTRAVENOUS | Status: AC
  Filled 2016-08-16: qty 5

## 2016-08-16 MED ORDER — FENTANYL CITRATE (PF) 100 MCG/2ML IJ SOLN
INTRAMUSCULAR | Status: AC
  Filled 2016-08-16: qty 2

## 2016-08-16 MED ORDER — LIDOCAINE 5 % EX OINT
1.0000 "application " | TOPICAL_OINTMENT | Freq: Two times a day (BID) | CUTANEOUS | Status: DC
  Administered 2016-08-17 – 2016-08-21 (×8): 1 via TOPICAL
  Filled 2016-08-16 (×2): qty 35.44

## 2016-08-16 MED ORDER — LIDOCAINE 2% (20 MG/ML) 5 ML SYRINGE
INTRAMUSCULAR | Status: AC
  Filled 2016-08-16: qty 5

## 2016-08-16 MED ORDER — SUGAMMADEX SODIUM 200 MG/2ML IV SOLN
INTRAVENOUS | Status: DC | PRN
  Administered 2016-08-16: 100 mg via INTRAVENOUS

## 2016-08-16 MED ORDER — BUPIVACAINE HCL (PF) 0.25 % IJ SOLN
INTRAMUSCULAR | Status: AC
  Filled 2016-08-16: qty 30

## 2016-08-16 MED ORDER — ALPRAZOLAM 1 MG PO TABS
1.0000 mg | ORAL_TABLET | Freq: Three times a day (TID) | ORAL | Status: DC
  Administered 2016-08-16 – 2016-08-21 (×15): 1 mg via ORAL
  Filled 2016-08-16 (×16): qty 1

## 2016-08-16 MED ORDER — ONDANSETRON HCL 4 MG/2ML IJ SOLN: 4.0000 mg | Freq: Four times a day (QID) | INTRAMUSCULAR | Status: DC | PRN

## 2016-08-16 MED ORDER — MORPHINE SULFATE (PF) 10 MG/ML IV SOLN
2.0000 mg | INTRAVENOUS | Status: DC | PRN
  Administered 2016-08-16 – 2016-08-17 (×4): 2 mg via INTRAVENOUS
  Filled 2016-08-16 (×5): qty 1

## 2016-08-16 MED ORDER — LORATADINE 10 MG PO TABS
10.0000 mg | ORAL_TABLET | Freq: Every day | ORAL | Status: DC
  Administered 2016-08-17 – 2016-08-21 (×5): 10 mg via ORAL
  Filled 2016-08-16 (×5): qty 1

## 2016-08-16 MED ORDER — HYDROCODONE-ACETAMINOPHEN 5-325 MG PO TABS
1.0000 | ORAL_TABLET | ORAL | Status: DC | PRN
  Administered 2016-08-17 (×2): 1 via ORAL
  Filled 2016-08-16 (×2): qty 1

## 2016-08-16 MED ORDER — RANITIDINE HCL 150 MG/10ML PO SYRP
150.0000 mg | ORAL_SOLUTION | Freq: Two times a day (BID) | ORAL | Status: DC
  Administered 2016-08-17 – 2016-08-21 (×8): 150 mg via ORAL
  Filled 2016-08-16 (×10): qty 10

## 2016-08-16 MED ORDER — ONDANSETRON HCL 4 MG/2ML IJ SOLN
INTRAMUSCULAR | Status: AC
  Filled 2016-08-16: qty 2

## 2016-08-16 MED ORDER — SUGAMMADEX SODIUM 200 MG/2ML IV SOLN
INTRAVENOUS | Status: AC
  Filled 2016-08-16: qty 2

## 2016-08-16 MED ORDER — ALBUMIN HUMAN 5 % IV SOLN
INTRAVENOUS | Status: AC
  Filled 2016-08-16: qty 250

## 2016-08-16 MED ORDER — ACETAMINOPHEN 10 MG/ML IV SOLN
INTRAVENOUS | Status: DC | PRN
  Administered 2016-08-16: 1000 mg via INTRAVENOUS

## 2016-08-16 MED ORDER — DIPHENHYDRAMINE HCL 50 MG/ML IJ SOLN: 12.5000 mg | Freq: Four times a day (QID) | INTRAMUSCULAR | Status: DC | PRN

## 2016-08-16 MED ORDER — ENSURE ENLIVE PO LIQD: 1.0000 | Freq: Every day | ORAL | Status: DC

## 2016-08-16 MED ORDER — 0.9 % SODIUM CHLORIDE (POUR BTL) OPTIME
TOPICAL | Status: DC | PRN
  Administered 2016-08-16: 1000 mL

## 2016-08-16 MED ORDER — LACTATED RINGERS IV SOLN
INTRAVENOUS | Status: DC
  Administered 2016-08-16 (×2): via INTRAVENOUS

## 2016-08-16 MED ORDER — ONDANSETRON HCL 4 MG/2ML IJ SOLN
INTRAMUSCULAR | Status: DC | PRN
  Administered 2016-08-16: 4 mg via INTRAVENOUS

## 2016-08-16 MED ORDER — PROMETHAZINE HCL 25 MG/ML IJ SOLN: 6.2500 mg | INTRAMUSCULAR | Status: DC | PRN

## 2016-08-16 MED ORDER — ACETAMINOPHEN 10 MG/ML IV SOLN
INTRAVENOUS | Status: AC
Start: 2016-08-16 — End: 2016-08-16
  Filled 2016-08-16: qty 100

## 2016-08-16 MED ORDER — DIPHENHYDRAMINE HCL 12.5 MG/5ML PO ELIX: 12.5000 mg | ORAL_SOLUTION | Freq: Four times a day (QID) | ORAL | Status: DC | PRN

## 2016-08-16 MED ORDER — DEXTROSE 50 % IV SOLN
INTRAVENOUS | Status: AC
  Administered 2016-08-16: 25 g
  Filled 2016-08-16: qty 50

## 2016-08-16 MED ORDER — FENTANYL CITRATE (PF) 100 MCG/2ML IJ SOLN
INTRAMUSCULAR | Status: DC | PRN
  Administered 2016-08-16 (×4): 50 ug via INTRAVENOUS

## 2016-08-16 MED ORDER — LIDOCAINE 2% (20 MG/ML) 5 ML SYRINGE
INTRAMUSCULAR | Status: DC | PRN
  Administered 2016-08-16: 100 mg via INTRAVENOUS

## 2016-08-16 MED ORDER — BUPIVACAINE LIPOSOME 1.3 % IJ SUSP
20.0000 mL | Freq: Once | INTRAMUSCULAR | Status: DC
  Filled 2016-08-16: qty 20

## 2016-08-16 MED ORDER — CEFOTETAN DISODIUM-DEXTROSE 2-2.08 GM-% IV SOLR
INTRAVENOUS | Status: AC
  Filled 2016-08-16: qty 50

## 2016-08-16 MED ORDER — HEPARIN SODIUM (PORCINE) 5000 UNIT/ML IJ SOLN
5000.0000 [IU] | Freq: Three times a day (TID) | INTRAMUSCULAR | Status: DC
  Administered 2016-08-16 – 2016-08-21 (×14): 5000 [IU] via SUBCUTANEOUS
  Filled 2016-08-16 (×15): qty 1

## 2016-08-16 MED ORDER — SODIUM CHLORIDE 0.9% FLUSH
10.0000 mL | INTRAVENOUS | Status: DC | PRN
  Administered 2016-08-17 – 2016-08-20 (×3): 10 mL
  Administered 2016-08-20: 20 mL
  Filled 2016-08-16 (×4): qty 40

## 2016-08-16 MED ORDER — BUPIVACAINE HCL (PF) 0.25 % IJ SOLN
INTRAMUSCULAR | Status: DC | PRN
  Administered 2016-08-16: 30 mL

## 2016-08-16 MED ORDER — PROPOFOL 10 MG/ML IV BOLUS
INTRAVENOUS | Status: DC | PRN
  Administered 2016-08-16: 160 mg via INTRAVENOUS

## 2016-08-16 MED ORDER — FAMOTIDINE 40 MG/5ML PO SUSR: 20.0000 mg | Freq: Two times a day (BID) | ORAL | Status: DC

## 2016-08-16 MED ORDER — CEFOTETAN DISODIUM-DEXTROSE 2-2.08 GM-% IV SOLR
2.0000 g | INTRAVENOUS | Status: AC
  Administered 2016-08-16: 2 g via INTRAVENOUS

## 2016-08-16 MED ORDER — ACETAMINOPHEN 325 MG PO TABS: 650.0000 mg | ORAL_TABLET | Freq: Four times a day (QID) | ORAL | Status: DC | PRN

## 2016-08-16 MED ORDER — FENTANYL CITRATE (PF) 100 MCG/2ML IJ SOLN
INTRAMUSCULAR | Status: AC
  Administered 2016-08-16: 50 ug
  Filled 2016-08-16: qty 2

## 2016-08-16 MED ORDER — MIDAZOLAM HCL 5 MG/5ML IJ SOLN
INTRAMUSCULAR | Status: DC | PRN
  Administered 2016-08-16: 2 mg via INTRAVENOUS

## 2016-08-16 MED ORDER — DEXTROSE 50 % IV SOLN
25.0000 mL | Freq: Once | INTRAVENOUS | Status: AC
  Administered 2016-08-16: 25 mL via INTRAVENOUS

## 2016-08-16 SURGICAL SUPPLY — 58 items
ADH SKN CLS APL DERMABOND .7 (GAUZE/BANDAGES/DRESSINGS)
APL SKNCLS STERI-STRIP NONHPOA (GAUZE/BANDAGES/DRESSINGS) ×1
BENZOIN TINCTURE PRP APPL 2/3 (GAUZE/BANDAGES/DRESSINGS) ×2 IMPLANT
CABLE HIGH FREQUENCY MONO STRZ (ELECTRODE) ×3 IMPLANT
CATH GASTROSTOMY 24FR (CATHETERS) IMPLANT
CHLORAPREP W/TINT 26ML (MISCELLANEOUS) ×3 IMPLANT
CLOSURE WOUND 1/2 X4 (GAUZE/BANDAGES/DRESSINGS) ×1
COVER SURGICAL LIGHT HANDLE (MISCELLANEOUS) ×3 IMPLANT
DECANTER SPIKE VIAL GLASS SM (MISCELLANEOUS) ×1 IMPLANT
DERMABOND ADVANCED (GAUZE/BANDAGES/DRESSINGS)
DERMABOND ADVANCED .7 DNX12 (GAUZE/BANDAGES/DRESSINGS) IMPLANT
DEVICE SUTURE ENDOST 10MM (ENDOMECHANICALS) IMPLANT
DRAIN CHANNEL 19F RND (DRAIN) IMPLANT
DRAIN PENROSE 18X1/2 LTX STRL (DRAIN) IMPLANT
DRAPE LAPAROSCOPIC ABDOMINAL (DRAPES) ×3 IMPLANT
ELECT REM PT RETURN 15FT ADLT (MISCELLANEOUS) ×3 IMPLANT
EVACUATOR SILICONE 100CC (DRAIN) IMPLANT
G-TUBE MIC 24F 7-10 BALLOON (CATHETERS) IMPLANT
GAUZE SPONGE 4X4 12PLY STRL (GAUZE/BANDAGES/DRESSINGS) IMPLANT
GLOVE BIOGEL PI IND STRL 7.0 (GLOVE) ×1 IMPLANT
GLOVE BIOGEL PI INDICATOR 7.0 (GLOVE) ×2
GLOVE SURG SS PI 7.0 STRL IVOR (GLOVE) ×3 IMPLANT
GOWN STRL REUS W/TWL LRG LVL3 (GOWN DISPOSABLE) ×3 IMPLANT
GOWN STRL REUS W/TWL XL LVL3 (GOWN DISPOSABLE) ×6 IMPLANT
GRASPER SUT TROCAR 14GX15 (MISCELLANEOUS) IMPLANT
KIT BASIN OR (CUSTOM PROCEDURE TRAY) ×3 IMPLANT
PAD POSITIONING PINK XL (MISCELLANEOUS) IMPLANT
RELOAD ENDO STITCH 2.0 (ENDOMECHANICALS) ×6
RELOAD SUT SNGL STCH BLK 2-0 (ENDOMECHANICALS) IMPLANT
SCISSORS LAP 5X35 DISP (ENDOMECHANICALS) ×3 IMPLANT
SET IRRIG TUBING LAPAROSCOPIC (IRRIGATION / IRRIGATOR) ×2 IMPLANT
SHEARS HARMONIC ACE PLUS 36CM (ENDOMECHANICALS) ×3 IMPLANT
SLEEVE XCEL OPT CAN 5 100 (ENDOMECHANICALS) ×8 IMPLANT
SPONGE DRAIN TRACH 4X4 STRL 2S (GAUZE/BANDAGES/DRESSINGS) IMPLANT
STRIP CLOSURE SKIN 1/2X4 (GAUZE/BANDAGES/DRESSINGS) ×1 IMPLANT
SUT ETHILON 2 0 PS N (SUTURE) ×2 IMPLANT
SUT ETHILON 3 0 PS 1 (SUTURE) IMPLANT
SUT MNCRL AB 4-0 PS2 18 (SUTURE) ×5 IMPLANT
SUT RELOAD ENDO STITCH 2.0 (ENDOMECHANICALS) ×2 IMPLANT
SUT SILK 2 0 SH (SUTURE) IMPLANT
SUT SILK 3 0 SH 30 (SUTURE) IMPLANT
SUT SURGIDAC NAB ES-9 0 48 120 (SUTURE) IMPLANT
SUT VIC AB 2-0 SH 27 (SUTURE)
SUT VIC AB 2-0 SH 27X BRD (SUTURE) IMPLANT
SUT VIC AB 3-0 SH 27 (SUTURE)
SUT VIC AB 3-0 SH 27XBRD (SUTURE) IMPLANT
SUT VICRYL 0 TIES 12 18 (SUTURE) IMPLANT
SUTURE RELOAD ENDO STITCH 2.0 (ENDOMECHANICALS) ×2 IMPLANT
TIP INNERVISION DETACH 40FR (MISCELLANEOUS) IMPLANT
TIP INNERVISION DETACH 50FR (MISCELLANEOUS) IMPLANT
TIP INNERVISION DETACH 56FR (MISCELLANEOUS) IMPLANT
TIPS INNERVISION DETACH 40FR (MISCELLANEOUS)
TOWEL OR 17X26 10 PK STRL BLUE (TOWEL DISPOSABLE) ×3 IMPLANT
TOWEL OR NON WOVEN STRL DISP B (DISPOSABLE) ×3 IMPLANT
TRAY LAPAROSCOPIC (CUSTOM PROCEDURE TRAY) ×3 IMPLANT
TROCAR BLADELESS OPT 5 100 (ENDOMECHANICALS) ×3 IMPLANT
TROCAR XCEL 12X100 BLDLESS (ENDOMECHANICALS) ×3 IMPLANT
TUBING INSUF HEATED (TUBING) ×3 IMPLANT

## 2016-08-16 NOTE — Anesthesia Postprocedure Evaluation (Addendum)
Anesthesia Post Note  Patient: Angie [name]  Procedure(s) Performed: Procedure(s) (LRB): LAPAROSCOPIC INSERTION OF GASTROSTOMY TUBE IN REMNANT STOMACH (N/A)  Patient location during evaluation: PACU Anesthesia Type: General Level of consciousness: awake and alert Pain management: pain level controlled Vital Signs Assessment: post-procedure vital signs reviewed and stable Respiratory status: spontaneous breathing, nonlabored ventilation, respiratory function stable and patient connected to nasal cannula oxygen Cardiovascular status: blood pressure returned to baseline and stable Postop Assessment: no signs of nausea or vomiting Anesthetic complications: no Comments: Baseline pain score 7       Last Vitals:  Vitals:   08/16/16 1705 08/16/16 1715  BP:    Pulse: 81 94  Resp: 19 (!) 21  Temp:  36.8 C    Last Pain:  Vitals:   08/16/16 1715  TempSrc:   PainSc: [place]

## 2016-08-16 NOTE — Anesthesia Procedure Notes (Signed)
Procedure Name: Intubation Date/Time: 08/16/2016 2:34 PM Performed by: Noralyn Pick D Pre-anesthesia Checklist: Patient identified, Emergency Drugs available, Suction available and Patient being monitored Patient Re-evaluated:Patient Re-evaluated prior to inductionOxygen Delivery Method: Circle system utilized Preoxygenation: Pre-oxygenation with 100% oxygen Intubation Type: IV induction Ventilation: Mask ventilation without difficulty Laryngoscope Size: Mac and 3 Grade View: Grade I Tube type: Oral Tube size: 7.0 mm Number of attempts: 1 Airway Equipment and Method: Stylet Placement Confirmation: ETT inserted through vocal cords under direct vision,  positive ETCO2 and breath sounds checked- equal and bilateral Secured at: 20 cm Tube secured with: Tape Dental Injury: Teeth and Oropharynx as per pre-operative assessment

## 2016-08-16 NOTE — Transfer of Care (Signed)
Immediate Anesthesia Transfer of Care Note  Patient: Angie [name]  Procedure(s) Performed: Procedure(s): LAPAROSCOPIC INSERTION OF GASTROSTOMY TUBE IN REMNANT STOMACH (N/A)  Patient Location: PACU  Anesthesia Type:General  Level of Consciousness: awake, alert  and oriented  Airway & Oxygen Therapy: Patient Spontanous Breathing and Patient connected to face mask oxygen  Post-op Assessment: Report given to RN and Post -op Vital signs reviewed and stable  Post vital signs: Reviewed and stable  Last Vitals:  Vitals:   08/16/16 1144  BP: 132/84  Pulse: 81  Resp: 18  Temp: 36.3 C    Last Pain:  Vitals:   08/16/16 1144  TempSrc: Oral         Complications: No apparent anesthesia complications

## 2016-08-16 NOTE — Op Note (Signed)
Preoperative diagnosis: malnutrition after gastrointestinal surgery  Postoperative diagnosis: same   Procedure: laparoscopic gastrostomy tube insertion Surgeon: Gurney Maxin, M.D.  Asst: Romana Juniper  Anesthesia: General  Indications for procedure: Angie [name] is a 51 y.o. year old female with symptoms of weight loss, inability to tolerate food who was diagnosed with a marginal ulcer leading to stricture presents for G tube insertion.  Description of procedure: The patient was brought into the operative suite. Anesthesia was administered with General endotracheal anesthesia. WHO checklist was applied. The patient was then placed in supine position. The area was prepped and draped in the usual sterile fashion.  Next, a small incision was made in the right subcostal position and 45mm optical entry trocar was used to gain access to the peritoneal space. High flow low pressure insufflation was given. Laparoscope was reinserted to confirm placement. Next 5 additional trocars were placed: 1 9mm trocar in the left periumbilical area, 1 16WV trocar in the right upper quadrant area, 1 36mm trocar in the left lateral space and 1 70mm trocar in the left upper abdomen. The roux limb was investigated and tracked to the [place] area and then to the [place] area, it did not twist and was not dilated. Moving it medially allowed visualization of the remnant stomach. A few filmy adhesions were taken down with sharp dissection to allow the anterior remnant stomach to appose the abdominal wall. Next, a purse string was made around the chose area of remnant stomach with 2-0 silk. The 20 Fr G tube was inserted via the left upper quadrant incision. A gastrotomy was made the tube was inserted into the stomach. The balloon was inflated to 56ml. 4 interrupted 2-0 silk sutures were used to appose the surrounding stomach to the abdominal wall. The G tube was then tightened so the balloon was apposing the stomach to the abdominal  wall. The outside of the tube showed 4cm just above the wafer. Pneumoperitoneum was removed. All trocars were withdrawn. 0.5% marcaine was infiltrated around the incisions. The incisions were closed with 4-0 monocryl. Steristrips and bandaids were put in place. The patient awoke from anesthesia in stable condition. All counts were correct.  Findings: intact G tube, no active inflammation or dilation of the roux limb.  Specimen: none  Implant: 20Fr g tube   Blood loss: <80ml  Local anesthesia: 3.7% marcaine  Complications: none  Gurney Maxin, M.D. General, Bariatric, & Minimally Invasive Surgery [facility]

## 2016-08-16 NOTE — Progress Notes (Signed)
Downey Lancour G-Tube Placement. Is she NPO. Can you order diff freq. of Fentanyl.  Some question.  Thanks. TNA to start tomorrow.

## 2016-08-16 NOTE — Progress Notes (Signed)
Patient has TPN going through PICC line right arm. Paged Dr Smith Robert and informed him of this. To keep TPN going during surgery.

## 2016-08-16 NOTE — Anesthesia Preprocedure Evaluation (Addendum)
Anesthesia Evaluation  Patient identified by MRN, date of birth, ID band Patient awake    Reviewed: Allergy & Precautions, NPO status , Patient's Chart, lab work & pertinent test results  Airway Mallampati: I       Dental  (+) Edentulous Upper, Edentulous Lower   Pulmonary neg pulmonary ROS, former smoker,    breath sounds clear to auscultation       Cardiovascular hypertension, Pt. on medications  Rhythm:Regular Rate:Normal     Neuro/Psych  Headaches, Seizures -,  PSYCHIATRIC DISORDERS Anxiety Depression Bipolar Disorder    GI/Hepatic Neg liver ROS, GERD  Medicated,  Endo/Other  diabetes, Type 2  Renal/GU Renal disease  negative genitourinary   Musculoskeletal  (+) Arthritis , Fibromyalgia -  Abdominal   Peds negative pediatric ROS (+)  Hematology negative hematology ROS (+)   Anesthesia Other Findings   Reproductive/Obstetrics                            Lab Results  Component Value Date   WBC 5.7 08/12/2016   HGB 9.2 (L) 08/12/2016   HCT 28.0 (L) 08/12/2016   MCV 94.6 08/12/2016   PLT 402 (H) 08/12/2016   Lab Results  Component Value Date   CREATININE 0.61 08/12/2016   BUN 10 08/12/2016   NA 138 08/12/2016   K 5.4 (H) 08/12/2016   CL 110 08/12/2016   CO2 23 08/12/2016   Lab Results  Component Value Date   INR 0.89 08/12/2016   INR 1.06 02/12/2016   EKG: normal sinus rhythm.  Echo - Left ventricle: The cavity size was normal. Wall thickness was   normal. The estimated ejection fraction was 55%. Although no   diagnostic regional wall motion abnormality was identified, this   possibility cannot be completely excluded on the basis of this   study. Doppler parameters are consistent with abnormal left   ventricular relaxation (grade 1 diastolic dysfunction). - Aortic valve: There was no stenosis. - Mitral valve: There was no significant regurgitation. - Right ventricle:  The cavity size was normal. Systolic function   was normal. - Tricuspid valve: Peak RV-RA gradient (S): 19 mm Hg. - Pulmonary arteries: PA peak pressure: 22 mm Hg (S). - Inferior vena cava: The vessel was normal in size. The   respirophasic diameter changes were in the normal range (>= 50%),   consistent with normal central venous pressure.  Anesthesia Physical Anesthesia Plan  ASA: II  Anesthesia Plan: General   Post-op Pain Management:    Induction: Intravenous  Airway Management Planned: Oral ETT  Additional Equipment:   Intra-op Plan:   Post-operative Plan: Extubation in OR  Informed Consent: I have reviewed the patients History and Physical, chart, labs and discussed the procedure including the risks, benefits and alternatives for the proposed anesthesia with the patient or authorized representative who has indicated his/her understanding and acceptance.   Dental advisory given  Plan Discussed with: CRNA  Anesthesia Plan Comments:         Anesthesia Quick Evaluation

## 2016-08-16 NOTE — H&P (Signed)
Angie [name] is an 51 y.o. female.   Chief Complaint: GJ stricture HPI: 51 yo female with history of gastric bypass who has profound weight loss and malnutrition, diagnosed by endoscopy that did not respond to multiple dilation. She presents for g tube placement  Past Medical History:  Diagnosis Date  . Abnormal Pap smear   . Alcohol abuse   . Anemia    IDA  . Anxiety    Takes xanax  . Arthritis    "qwhere" (04/26/2016),   . Bipolar disorder ([place])   . Blood transfusion without reported diagnosis   . Bulging lumbar disc   . Chronic lower back pain   . Depression   . Fibromyalgia   . Headache    "weekly @ least" (04/26/2016)  . Hypertension   . Lactose intolerance in adult 2017  . Migraines    "weekly @ least" (04/26/2016)  . Seizures ([place])    last one Dec. 2017  . Type II diabetes mellitus ([place])    "before the gastric bypass" (04/26/2016)    Past Surgical History:  Procedure Laterality Date  . BALLOON DILATION N/A 05/27/2016   Procedure: BALLOON DILATION;  Surgeon: Doran Stabler, MD;  Location: [facility];  Service: Gastroenterology;  Laterality: N/A;  . CERVICAL CONE BIOPSY    . [place]; 1996; 1998; 2014  . CESAREAN SECTION WITH BILATERAL TUBAL LIGATION Bilateral 10/28/2012   Procedure: Repeat cesarean section with delivery of baby boy at 109. Apgars 8/9.  BILATERAL TUBAL LIGATION;  Surgeon: Florian Buff, MD;  Location: [facility];  Service: Obstetrics;  Laterality: Bilateral;  . DILATION AND CURETTAGE OF UTERUS    . ESOPHAGOGASTRODUODENOSCOPY N/A 05/27/2016   Procedure: ESOPHAGOGASTRODUODENOSCOPY (EGD);  Surgeon: Doran Stabler, MD;  Location: [facility];  Service: Gastroenterology;  Laterality: N/A;  . ESOPHAGOGASTRODUODENOSCOPY (EGD) WITH PROPOFOL N/A 04/23/2016   Procedure: ESOPHAGOGASTRODUODENOSCOPY (EGD) WITH PROPOFOL;  Surgeon: Doran Stabler, MD;  Location: [facility];  Service: Endoscopy;  Laterality: N/A;  . ROUX-EN-Y GASTRIC BYPASS  2007  .  TUBAL LIGATION  2014    Family History  Problem Relation Age of Onset  . Diabetes Father   . Heart disease Father   . Depression Maternal Grandmother   . Heart disease Maternal Grandfather   . Depression Paternal Grandmother   . Colon cancer Paternal Grandfather   . Pancreatic cancer Paternal Grandfather    Social History:  reports that she quit smoking about 4 years ago. Her smoking use included Cigarettes. She has a 2.00 pack-year smoking history. She has never used smokeless tobacco. She reports that she drinks about 10.2 oz of alcohol per week . She reports that she does not use drugs.  Allergies:  Allergies  Allergen Reactions  . Iron     Had acute allergy with tachycardia, attention and generalized itching shortly after receiving nulecit ( iron gluconate) infusion.    Medications Prior to Admission  Medication Sig Dispense Refill  . ALPRAZolam (XANAX) 1 MG tablet Take 1 mg by mouth 3 (three) times daily.     . benazepril (LOTENSIN) 20 MG tablet Take 20 mg by mouth daily at 12 noon.    . cetirizine (ZYRTEC) 10 MG tablet Take 10 mg by mouth daily as needed for allergies.    . famotidine (PEPCID) 40 MG/5ML suspension Take 20 mg by mouth 2 (two) times daily.    Marland Kitchen lidocaine (XYLOCAINE) 5 % ointment Apply 1 application topically 2 (two) times  daily.     . Nutritional Supplements (ENSURE CLEAR) LIQD Take 1 Bottle by mouth 4 (four) times daily. (Patient taking differently: Take 1 Bottle by mouth daily. ) 120 Bottle 11  . ondansetron (ZOFRAN ODT) 4 MG disintegrating tablet Take 1 tablet (4 mg total) by mouth every 8 (eight) hours as needed for nausea. (Patient taking differently: Take 4 mg by mouth every 8 (eight) hours as needed for nausea or vomiting. ) 6 tablet 0  . oxyCODONE-acetaminophen (PERCOCET) 10-325 MG tablet Take 1 tablet by mouth every 6 (six) hours as needed for pain. (Patient taking differently: Take 1 tablet by mouth 4 (four) times daily. ) 30 tablet 0  . oxymetazoline  (AFRIN) 0.05 % nasal spray Place 1 spray into both nostrils 2 (two) times daily as needed (dizziness).    . polyethylene glycol (MIRALAX / GLYCOLAX) packet Take 17 g by mouth daily as needed for mild constipation. 14 each 0  . QUEtiapine (SEROQUEL) 100 MG tablet Take 1 tablet (100 mg total) by mouth at bedtime. 30 tablet 2  . TPN ADULT Inject 820 mLs into the vein continuous. Patient flushes PIC line daily at 5:30. Flushes 1st with 10 mL normal saline flush and then 5 mL heparin (50 units/69mL) Also adds 10 mL adult Multi Vitamin to TPN bag TPN bag has 100 mL of overfill in it.    . pregabalin (LYRICA) 50 MG capsule Take 50 mg by mouth 3 (three) times daily.       No results found for this or any previous visit (from the past 48 hour(s)). No results found.  Review of Systems  Constitutional: Negative for chills and fever.  HENT: Negative for hearing loss.   Eyes: Negative for blurred vision and double vision.  Respiratory: Negative for cough and hemoptysis.   Cardiovascular: Negative for chest pain and palpitations.  Gastrointestinal: Negative for abdominal pain, nausea and vomiting.  Genitourinary: Negative for dysuria and urgency.  Musculoskeletal: Negative for myalgias and neck pain.  Skin: Negative for itching and rash.  Neurological: Negative for dizziness, tingling and headaches.  Endo/Heme/Allergies: Does not bruise/bleed easily.  Psychiatric/Behavioral: Negative for depression and suicidal ideas.    Blood pressure 132/84, pulse 81, temperature 97.3 F (36.3 C), temperature source Oral, resp. rate 18, height 5' (1.524 m), weight 50.3 kg (111 lb), SpO2 94 %. Physical Exam  Vitals reviewed. Constitutional: She is oriented to person, place, and time. She appears well-developed. She appears cachectic.  HENT:  Head: Normocephalic and atraumatic.  Eyes: Conjunctivae and EOM are normal. Pupils are equal, round, and reactive to light.  Neck: Normal range of motion. Neck supple.   Cardiovascular: Normal rate and regular rhythm.   Respiratory: Effort normal and breath sounds normal.  GI: Soft. Bowel sounds are normal. She exhibits no distension. There is no tenderness.  Musculoskeletal: Normal range of motion.  Neurological: She is alert and oriented to person, place, and time.  Skin: Skin is warm and dry.  Psychiatric: She has a normal mood and affect. Her behavior is normal.     Assessment/Plan 51 yo female with profound malnutrition -lap g tube placement -observation afterwards  [name], MD 08/16/2016, 2:21 PM

## 2016-08-17 LAB — BASIC METABOLIC PANEL
Anion gap: 6 (ref 5–15)
BUN: 12 mg/dL (ref 6–20)
CO2: 23 mmol/L (ref 22–32)
Calcium: 8.2 mg/dL — ABNORMAL LOW (ref 8.9–10.3)
Chloride: 109 mmol/L (ref 101–111)
Creatinine, Ser: 0.53 mg/dL (ref 0.44–1.00)
GFR calc Af Amer: >60 mL/min (ref >60)
GFR calc non Af Amer: >60 mL/min (ref >60)
Glucose, Bld: 91 mg/dL (ref 65–99)
Potassium: 3.8 mmol/L (ref 3.5–5.1)
Sodium: 138 mmol/L (ref 135–145)

## 2016-08-17 LAB — GLUCOSE, CAPILLARY
Glucose-Capillary: 114 mg/dL — ABNORMAL HIGH (ref 65–99)
Glucose-Capillary: 120 mg/dL — ABNORMAL HIGH (ref 65–99)
Glucose-Capillary: 120 mg/dL — ABNORMAL HIGH (ref 65–99)
Glucose-Capillary: 122 mg/dL — ABNORMAL HIGH (ref 65–99)
Glucose-Capillary: 74 mg/dL (ref 65–99)
Glucose-Capillary: 74 mg/dL (ref 65–99)
Glucose-Capillary: 81 mg/dL (ref 65–99)

## 2016-08-17 LAB — CBC
HCT: 24.8 % — ABNORMAL LOW (ref 36.0–46.0)
Hemoglobin: 8.4 g/dL — ABNORMAL LOW (ref 12.0–15.0)
MCH: 31.7 pg (ref 26.0–34.0)
MCHC: 33.9 g/dL (ref 30.0–36.0)
MCV: 93.6 fL (ref 78.0–100.0)
Platelets: 293 10*3/uL (ref 150–400)
RBC: 2.65 MIL/uL — ABNORMAL LOW (ref 3.87–5.11)
RDW: 13.6 % (ref 11.5–15.5)
WBC: 5.6 10*3/uL (ref 4.0–10.5)

## 2016-08-17 MED ORDER — SODIUM CHLORIDE 0.9 % IV BOLUS (SEPSIS)
1000.0000 mL | Freq: Once | INTRAVENOUS | Status: AC
  Administered 2016-08-17: 1000 mL via INTRAVENOUS

## 2016-08-17 MED ORDER — HYDROMORPHONE HCL 1 MG/ML IJ SOLN
0.5000 mg | INTRAMUSCULAR | Status: DC | PRN
  Administered 2016-08-17 – 2016-08-19 (×9): 0.5 mg via INTRAVENOUS
  Filled 2016-08-17 (×10): qty 1

## 2016-08-17 MED ORDER — OXYCODONE HCL 5 MG/5ML PO SOLN
7.5000 mg | ORAL | Status: DC | PRN
Start: 2016-08-17 — End: 2016-08-18
  Administered 2016-08-17 – 2016-08-18 (×4): 7.5 mg
  Filled 2016-08-17 (×5): qty 10

## 2016-08-17 MED ORDER — ACETAMINOPHEN 160 MG/5ML PO SOLN
500.0000 mg | Freq: Four times a day (QID) | ORAL | Status: DC | PRN
  Administered 2016-08-19 – 2016-08-21 (×5): 500 mg
  Filled 2016-08-17 (×5): qty 20.3

## 2016-08-17 MED ORDER — BOOST / RESOURCE BREEZE PO LIQD
1.0000 | Freq: Three times a day (TID) | ORAL | Status: DC
  Administered 2016-08-17 – 2016-08-21 (×10): 1 via ORAL
  Filled 2016-08-17 (×2): qty 1

## 2016-08-17 MED ORDER — BOOST PLUS PO LIQD
237.0000 mL | Freq: Three times a day (TID) | ORAL | Status: DC
  Administered 2016-08-17: 237 mL via ORAL
  Filled 2016-08-17: qty 237

## 2016-08-17 NOTE — Progress Notes (Addendum)
Residual check of Gastronomy tube yielded 30 ml of Jevity; primary RN notified; no complications noted; will continue to monitor

## 2016-08-17 NOTE — Progress Notes (Signed)
Pt requesting medication frequently throughout the day.  Tried to encourage ambulation but she could only make it to the bathroom and she was bent over guarding her stomach while walking.  She has been retaining her tube feeding, (MD was notified),  but she has also been drinking a lot of fluids today. Will continue to monitor.

## 2016-08-17 NOTE — Progress Notes (Signed)
Residual was checked on feeding tube and there was 110 ml. At 11:00 am, when rate was increased to 50 there was 50 ml residual.  PA was called and orders were given to turn off feeding for an hour and then restart it at a rate of 40 and recheck residual in a couple of hours.  Will continue to monitor.

## 2016-08-17 NOTE — Progress Notes (Signed)
Called MD concerning residual fluid from g-tube. At 5 pm it was 110, at the rate of 40.  Orders were given to reduce rate to 20. Will continue to monitor.

## 2016-08-17 NOTE — Progress Notes (Signed)
Progress Note: General Surgery Service   Subjective: Moderate pain in left side, up to chair yesterday.  Objective: Vital signs in last 24 hours: Temp:  [97.3 F (36.3 C)-99.1 F (37.3 C)] 97.8 F (36.6 C) (04/24 0946) Pulse Rate:  [72-118] 97 (04/24 0946) Resp:  [11-22] 18 (04/24 0946) BP: (88-134)/(55-88) 88/55 (04/24 0946) SpO2:  [94 %-100 %] 100 % (04/24 0946) Weight:  [50.3 kg (111 lb)] 50.3 kg (111 lb) (04/23 1740) Last BM Date: 08/15/16  Intake/Output from previous day: 04/23 0701 - 04/24 0700 In: 1765 [I.V.:1300; NG/GT:195; IV Piggyback:250] Out: 1800 [Urine:1800] Intake/Output this shift: Total I/O In: 505.8 [P.O.:237; I.V.:268.8] Out: 300 [Urine:300]  Lungs: CTAB  Cardiovascular: RRR  Abd: soft, ATTP, ND  Extremities: no edema  Neuro: AOx4  Lab Results: CBC   Recent Labs  08/16/16 1840 08/17/16 0702  WBC 7.3 5.6  HGB 8.6* 8.4*  HCT 26.0* 24.8*  PLT 339 293   BMET  Recent Labs  08/16/16 1840 08/17/16 0702  NA  --  138  K  --  3.8  CL  --  109  CO2  --  23  GLUCOSE  --  91  BUN  --  12  CREATININE 0.66 0.53  CALCIUM  --  8.2*   PT/INR No results for input(s): LABPROT, INR in the last 72 hours. ABG No results for input(s): PHART, HCO3 in the last 72 hours.  Invalid input(s): PCO2, PO2  Studies/Results:  Anti-infectives: Anti-infectives    Start     Dose/Rate Route Frequency Ordered Stop   08/16/16 1329  cefoTEtan in Dextrose 5% (CEFOTAN) 2-2.08 GM-% IVPB    Comments:  Bridget Hartshorn   : cabinet override      08/16/16 1329 08/16/16 1436   08/16/16 1056  cefoTEtan in Dextrose 5% (CEFOTAN) IVPB 2 g     2 g Intravenous On call to O.R. 08/16/16 1058 08/16/16 1456      Medications: Scheduled Meds: . ALPRAZolam  1 mg Oral TID  . heparin  5,000 Units Subcutaneous Q8H  . lactose free nutrition  237 mL Oral TID WC  . lidocaine  1 application Topical BID  . loratadine  10 mg Oral Daily  . QUEtiapine  100 mg Oral QHS  .  ranitidine  150 mg Oral BID   Continuous Infusions: . dextrose 5 % and 0.45 % NaCl with KCl 20 mEq/L 40 mL/hr (08/17/16 0523)  . feeding supplement (JEVITY 1.2 CAL) 40 mL (08/17/16 0524)  . sodium chloride     PRN Meds:.acetaminophen (TYLENOL) oral liquid 160 mg/5 mL, diphenhydrAMINE **OR** diphenhydrAMINE, HYDROmorphone (DILAUDID) injection, ondansetron **OR** ondansetron (ZOFRAN) IV, oxyCODONE, oxymetazoline, simethicone, sodium chloride flush  Assessment/Plan: Patient Active Problem List   Diagnosis Date Noted  . Malnutrition following gastrointestinal surgery 08/16/2016  . Anastomotic ulcer 07/21/2016  . Abdominal pain 07/21/2016  . Transient alteration of awareness   . Alcohol use 04/27/2016  . Chronic pain syndrome 04/27/2016  . Iron deficiency anemia 04/27/2016  . B12 deficiency 04/27/2016  . Malnutrition ([place])   . Gastrojejunal anastomotic stricture   . Hypoglycemia 04/22/2016  . Syncope 04/22/2016  . Protein-calorie malnutrition, severe 04/22/2016  . Nausea and vomiting   . Abdominal pain, chronic, epigastric   . Hypotension 02/01/2016  . Hyponatremia 02/01/2016  . Acute kidney injury ([place]) 02/01/2016  . Subacromial impingement of left shoulder 07/06/2013  . Trochanteric bursitis of left hip 05/11/2013  . Absolute anemia 07/24/2012  . Back pain complicating pregnancy 02/40/9735  .  AMA (advanced maternal age) multigravida 35+ 05/16/2012  . Insufficient prenatal care 05/16/2012  . Previous cesarean section 05/16/2012  . Arthritis 05/16/2012  . S/P gastric bypass 05/16/2012   s/p Procedure(s): LAPAROSCOPIC INSERTION OF GASTROSTOMY TUBE IN REMNANT STOMACH 08/16/2016 -change medications to be per tube -increase IV fluids for hypotension -change away from morphine for hypotension -full liquids by mouth as tolerated -continue jevity -encourage ambulation -dispo: in hospital for post op care    LOS: 1 day   [name], MD Pg# [phone] [facility]

## 2016-08-17 NOTE — Progress Notes (Signed)
Checked residual before restarting tube feeding and there was 65 ml there.

## 2016-08-17 NOTE — Progress Notes (Signed)
[facility]  Ms. Stoneham has been an active pt with [facility] prior to this hospital admission. [facility] is providing HHRN and Home TPN Pharmacy Team for home TPN prior to this admission.   [facility] hospital team will follow pt while inpatient to support transition home when ordered.  If patient discharges after hours, please call [phone].   Larry Sierras 08/17/2016, 11:18 AM

## 2016-08-17 NOTE — Progress Notes (Signed)
Hypoglycemic Event  CBG: 60 Treatment: D50  Symptoms: Ssypmptomatic  Follow-up CBG: Time: 2020 CBG Result:148  Possible Reasons for Event:Patient was NPO Comments/MD notified:YES. Dr. Benita Gutter, [place] H

## 2016-08-17 NOTE — Progress Notes (Signed)
Residual from G tube -was  110 ml; RN notified

## 2016-08-17 NOTE — Progress Notes (Signed)
Initial Nutrition Assessment  DOCUMENTATION CODES:   Severe malnutrition in context of chronic illness  INTERVENTION:   Continue Jevity 1.2 @ 50 ml/hr via PEG in remnant stomach. Provides 1440 kcal (96% of kcal needs), 66g protein (94% of needs) and 968 ml H2O.  If IVF discontinued, recommend free water flushes of 240 ml TID to meet fluid needs.  Continue Boost Breeze po TID, each supplement provides 250 kcal and 9 grams of protein Encourage PO intakes as tolerated  RD will continue to monitor for tolerance of TF  NUTRITION DIAGNOSIS:   Malnutrition (Severe) related to chronic illness (gastric bypass in 2007) as evidenced by severe depletion of body fat, severe depletion of muscle mass.  GOAL:   Patient will meet greater than or equal to 90% of their needs  MONITOR:   PO intake, Supplement acceptance, Labs, Weight trends, Skin, I & O's, Other (Comment), TF tolerance  REASON FOR ASSESSMENT:   Consult Enteral/tube feeding initiation and management  ASSESSMENT:   51 yo female with history of gastric bypass who has profound weight loss and malnutrition, diagnosed by endoscopy that did not respond to multiple dilation. She presents for g tube placement 4/23: s/p laparoscopic gastrostomy tube insertion  Patient in room with no visitors at bedside. Pt states that she has been on home TPN since her last admission at the end of March. RD is familiar with patient from that admission. Pt with progressive malnutrition from gastric bypass surgery in 2007 in which she was successful with initially after surgery. In December 2017, pt was hospitalized for  anastomotic strictures and underwent multiple EGDs. Pt was placed on a pureed diet at that time but was not eating well. Pt was not taking vitamins and supplements.   Patient reports she still cannot tolerate solid foods. She primarily consumes orange creamsicle popsicles, pudding, and hot tea. She tries to drink Ensure Clear supplements  but only 1 a day d/t the cost. G-tube was placed in remnant stomach yesterday and surgery started TF of Jevity 1.2 @ 50 ml/hr. Pt currently tolerating this rate. Residuals have been checked and insignificant. Will not advance as this rate provides 96% of kcal and 94% of protein needs.Additional kcal and protein to come from limited PO intake. Pt is drinking Boost Breeze supplements.  Pt's weight has increased slightly to 111 lb since last admission. Nutrition-Focused physical exam completed. Findings are severe fat depletion, severe muscle depletion, and no edema.   Labs reviewed. Medications: D5 and .45% NaCl w/ KCl infusion at 100 ml/hr -provides 408 kcal  Diet Order:  TPN ADULT Diet full liquid Room service appropriate? Yes; Fluid consistency: Thin  Skin:  Wound (see comment) (4/23 abdominal incision)  Last BM:  4/22  Height:   Ht Readings from Last 1 Encounters:  08/16/16 5' (1.524 m)    Weight:   Wt Readings from Last 1 Encounters:  08/16/16 111 lb (50.3 kg)    Ideal Body Weight:  45.5 kg  BMI:  Body mass index is 21.68 kg/m.  Estimated Nutritional Needs:   Kcal:  1500-1700  Protein:  70-80g  Fluid:  1.7L/day  EDUCATION NEEDS:   Education needs addressed  Clayton Bibles, MS, RD, LDN Pager: [phone] After Hours Pager: [phone]

## 2016-08-17 NOTE — Progress Notes (Signed)
Called with increased residual TF;   with rate of TF up to 50 ml/hr.  She had 110 ml in stomach.  She had 80 ml  earlier when rate was lower.  I told them to hold TF for an hour, then resume at 40 ml/hr, check residual in 2 hours.

## 2016-08-18 LAB — BASIC METABOLIC PANEL
Anion gap: 7 (ref 5–15)
BUN: 8 mg/dL (ref 6–20)
CO2: 18 mmol/L — ABNORMAL LOW (ref 22–32)
Calcium: 7.8 mg/dL — ABNORMAL LOW (ref 8.9–10.3)
Chloride: 112 mmol/L — ABNORMAL HIGH (ref 101–111)
Creatinine, Ser: 0.52 mg/dL (ref 0.44–1.00)
GFR calc Af Amer: >60 mL/min (ref >60)
GFR calc non Af Amer: >60 mL/min (ref >60)
Glucose, Bld: 167 mg/dL — ABNORMAL HIGH (ref 65–99)
Potassium: 3.8 mmol/L (ref 3.5–5.1)
Sodium: 137 mmol/L (ref 135–145)

## 2016-08-18 LAB — GLUCOSE, CAPILLARY
Glucose-Capillary: 111 mg/dL — ABNORMAL HIGH (ref 65–99)
Glucose-Capillary: 114 mg/dL — ABNORMAL HIGH (ref 65–99)
Glucose-Capillary: 118 mg/dL — ABNORMAL HIGH (ref 65–99)
Glucose-Capillary: 160 mg/dL — ABNORMAL HIGH (ref 65–99)
Glucose-Capillary: 172 mg/dL — ABNORMAL HIGH (ref 65–99)
Glucose-Capillary: 57 mg/dL — ABNORMAL LOW (ref 65–99)

## 2016-08-18 LAB — CBC
HCT: 23.8 % — ABNORMAL LOW (ref 36.0–46.0)
Hemoglobin: 7.5 g/dL — ABNORMAL LOW (ref 12.0–15.0)
MCH: 29.5 pg (ref 26.0–34.0)
MCHC: 31.5 g/dL (ref 30.0–36.0)
MCV: 93.7 fL (ref 78.0–100.0)
Platelets: 296 10*3/uL (ref 150–400)
RBC: 2.54 MIL/uL — ABNORMAL LOW (ref 3.87–5.11)
RDW: 13.6 % (ref 11.5–15.5)
WBC: 2.8 10*3/uL — ABNORMAL LOW (ref 4.0–10.5)

## 2016-08-18 MED ORDER — OXYCODONE HCL 5 MG/5ML PO SOLN
10.0000 mg | ORAL | Status: DC | PRN
  Administered 2016-08-18 – 2016-08-21 (×14): 10 mg
  Filled 2016-08-18 (×13): qty 10
  Filled 2016-08-18: qty 15
  Filled 2016-08-18: qty 10

## 2016-08-18 MED ORDER — CYCLOBENZAPRINE HCL 5 MG PO TABS
5.0000 mg | ORAL_TABLET | Freq: Three times a day (TID) | ORAL | Status: DC | PRN
Start: 2016-08-18 — End: 2016-08-21
  Administered 2016-08-18 – 2016-08-20 (×5): 5 mg via ORAL
  Filled 2016-08-18 (×7): qty 1

## 2016-08-18 NOTE — Progress Notes (Addendum)
Patient's G Tube Feeding was advanced  10 cc every 4 hours during the night because on checking for residual, there was no reisual  found until  we reached 40 cc hour there was only 10 cc realized.  She tolerated  TF digesting very well with no c/o  Pain, bloating or fullness. However,  If she takes in a lot of  liquids  by mouth the residual increases.

## 2016-08-18 NOTE — Progress Notes (Signed)
Progress Note: General Surgery Service   Chief Complaint: Weight loss  Subjective: Pain continues, tolerating oral medications  Objective: Vital signs in last 24 hours: Temp:  [97.8 F (36.6 C)-99 F (37.2 C)] 97.8 F (36.6 C) (04/25 0440) Pulse Rate:  [88-103] 96 (04/25 0440) Resp:  [16-20] 16 (04/25 0440) BP: (88-107)/(55-65) 92/56 (04/25 0440) SpO2:  [97 %-100 %] 99 % (04/25 0440) Weight:  [56.7 kg (124 lb 14.4 oz)] 56.7 kg (124 lb 14.4 oz) (04/24 1932) Last BM Date: 08/15/16  Intake/Output from previous day: 04/24 0701 - 04/25 0700 In: 5303 [P.O.:1797; I.V.:2768.8; NG/GT:717.2] Out: 3460 [Urine:3200; Drains:260] Intake/Output this shift: No intake/output data recorded.  Lungs: CTAB  Cardiovascular: RRR  Abd: soft, ATTP, tube in place  Extremities: no edema  Neuro: AOx4  Lab Results: CBC   Recent Labs  08/17/16 0702 08/18/16 0458  WBC 5.6 2.8*  HGB 8.4* 7.5*  HCT 24.8* 23.8*  PLT 293 296   BMET  Recent Labs  08/17/16 0702 08/18/16 0458  NA 138 137  K 3.8 3.8  CL 109 112*  CO2 23 18*  GLUCOSE 91 167*  BUN 12 8  CREATININE 0.53 0.52  CALCIUM 8.2* 7.8*   PT/INR No results for input(s): LABPROT, INR in the last 72 hours. ABG No results for input(s): PHART, HCO3 in the last 72 hours.  Invalid input(s): PCO2, PO2  Studies/Results:  Anti-infectives: Anti-infectives    Start     Dose/Rate Route Frequency Ordered Stop   08/16/16 1329  cefoTEtan in Dextrose 5% (CEFOTAN) 2-2.08 GM-% IVPB    Comments:  Bridget Hartshorn   : cabinet override      08/16/16 1329 08/16/16 1436   08/16/16 1056  cefoTEtan in Dextrose 5% (CEFOTAN) IVPB 2 g     2 g Intravenous On call to O.R. 08/16/16 1058 08/16/16 1456      Medications: Scheduled Meds: . ALPRAZolam  1 mg Oral TID  . feeding supplement  1 Container Oral TID BM  . heparin  5,000 Units Subcutaneous Q8H  . lidocaine  1 application Topical BID  . loratadine  10 mg Oral Daily  . QUEtiapine  100 mg  Oral QHS  . ranitidine  150 mg Oral BID   Continuous Infusions: . dextrose 5 % and 0.45 % NaCl with KCl 20 mEq/L 100 mL/hr at 08/17/16 1800  . feeding supplement (JEVITY 1.2 CAL) 1,000 mL (08/17/16 2132)   PRN Meds:.acetaminophen (TYLENOL) oral liquid 160 mg/5 mL, diphenhydrAMINE **OR** diphenhydrAMINE, HYDROmorphone (DILAUDID) injection, ondansetron **OR** ondansetron (ZOFRAN) IV, oxyCODONE, oxymetazoline, simethicone, sodium chloride flush  Assessment/Plan: Patient Active Problem List   Diagnosis Date Noted  . Malnutrition following gastrointestinal surgery 08/16/2016  . Anastomotic ulcer 07/21/2016  . Abdominal pain 07/21/2016  . Transient alteration of awareness   . Alcohol use 04/27/2016  . Chronic pain syndrome 04/27/2016  . Iron deficiency anemia 04/27/2016  . B12 deficiency 04/27/2016  . Malnutrition ([place])   . Gastrojejunal anastomotic stricture   . Hypoglycemia 04/22/2016  . Syncope 04/22/2016  . Protein-calorie malnutrition, severe 04/22/2016  . Nausea and vomiting   . Abdominal pain, chronic, epigastric   . Hypotension 02/01/2016  . Hyponatremia 02/01/2016  . Acute kidney injury ([place]) 02/01/2016  . Subacromial impingement of left shoulder 07/06/2013  . Trochanteric bursitis of left hip 05/11/2013  . Absolute anemia 07/24/2012  . Back pain complicating pregnancy 39/06/90  . AMA (advanced maternal age) multigravida 35+ 05/16/2012  . Insufficient prenatal care 05/16/2012  . Previous cesarean section  05/16/2012  . Arthritis 05/16/2012  . S/P gastric bypass 05/16/2012   s/p Procedure(s): LAPAROSCOPIC INSERTION OF GASTROSTOMY TUBE IN REMNANT STOMACH 08/16/2016 -decrease IV fluids -increase PO pain meds -add flexeril    LOS: 2 days   [name], MD Pg# [phone] [facility]

## 2016-08-18 NOTE — Progress Notes (Signed)
Nutrition Follow-up  DOCUMENTATION CODES:   Severe malnutrition in context of chronic illness  INTERVENTION:   Continue Jevity 1.2 @ 50 ml/hr via PEG in remnant stomach. Provides 1440 kcal (96% of kcal needs), 66g protein (94% of needs) and 968 ml H2O.  If IVF discontinued, recommend free water flushes of 240 ml TID to meet fluid needs.  Continue Boost Breeze po TID, each supplement provides 250 kcal and 9 grams of protein Encourage PO intakes as tolerated  RD will continue to monitor for tolerance of TF  NUTRITION DIAGNOSIS:   Malnutrition (Severe) related to chronic illness (gastric bypass in 2007) as evidenced by severe depletion of body fat, severe depletion of muscle mass.  Ongoing.  GOAL:   Patient will meet greater than or equal to 90% of their needs  Meeting with TF & PO intake  MONITOR:   PO intake, Supplement acceptance, Labs, Weight trends, Skin, I & O's, Other (Comment), TF tolerance  ASSESSMENT:   51 yo female with history of gastric bypass who has profound weight loss and malnutrition, diagnosed by endoscopy that did not respond to multiple dilation. She presents for g tube placement  Pt receiving a bath during time of visit. Spoke with pt's RN, who states she just advanced Jevity 1.2 back to goal rate of 50 ml/hr. Yesterday and overnight the patient was having residuals ranging from 50 to 110 ml. Significant amounts of residual is >500 ml. Will continue to monitor. Pt with no other issues, other than pain from tube placement.   Medications: D5 and .45% NaCl w/ KCl infusion at 20 ml/hr  Labs reviewed: CBGs: 57-172  Diet Order:  TPN ADULT Diet full liquid Room service appropriate? Yes; Fluid consistency: Thin  Skin:  Wound (see comment) (4/23 abdominal incision)  Last BM:  4/22  Height:   Ht Readings from Last 1 Encounters:  08/16/16 5' (1.524 m)    Weight:   Wt Readings from Last 1 Encounters:  08/17/16 124 lb 14.4 oz (56.7 kg)    Ideal  Body Weight:  45.5 kg  BMI:  Body mass index is 24.39 kg/m.  Estimated Nutritional Needs:   Kcal:  1500-1700  Protein:  70-80g  Fluid:  1.7L/day  EDUCATION NEEDS:   Education needs addressed  Clayton Bibles, MS, RD, LDN Pager: [phone] After Hours Pager: [phone]

## 2016-08-18 NOTE — Progress Notes (Signed)
Pt continued to have residual today.  She was asked to not drink so much but was not compliant with that request. Asks for pain medicine frequently.  Tried to encourage ambulation in hall but she refused.

## 2016-08-18 NOTE — Progress Notes (Signed)
In to visit with patient and introduce myself as Bariatric Associate Professor.  Contact information provided to patient for any future questions or concerns.  No questions at this time.

## 2016-08-19 LAB — COMPREHENSIVE METABOLIC PANEL
ALT: 18 U/L (ref 14–54)
AST: 24 U/L (ref 15–41)
Albumin: 3.1 g/dL — ABNORMAL LOW (ref 3.5–5.0)
Alkaline Phosphatase: 64 U/L (ref 38–126)
Anion gap: 5 (ref 5–15)
BUN: 11 mg/dL (ref 6–20)
CO2: 18 mmol/L — ABNORMAL LOW (ref 22–32)
Calcium: 8.3 mg/dL — ABNORMAL LOW (ref 8.9–10.3)
Chloride: 113 mmol/L — ABNORMAL HIGH (ref 101–111)
Creatinine, Ser: 0.46 mg/dL (ref 0.44–1.00)
GFR calc Af Amer: >60 mL/min (ref >60)
GFR calc non Af Amer: >60 mL/min (ref >60)
Glucose, Bld: 139 mg/dL — ABNORMAL HIGH (ref 65–99)
Potassium: 4 mmol/L (ref 3.5–5.1)
Sodium: 136 mmol/L (ref 135–145)
Total Bilirubin: 0.1 mg/dL — ABNORMAL LOW (ref 0.3–1.2)
Total Protein: 6.1 g/dL — ABNORMAL LOW (ref 6.5–8.1)

## 2016-08-19 LAB — GLUCOSE, CAPILLARY
Glucose-Capillary: 88 mg/dL (ref 65–99)
Glucose-Capillary: 166 mg/dL — ABNORMAL HIGH (ref 65–99)
Glucose-Capillary: 83 mg/dL (ref 65–99)
Glucose-Capillary: 122 mg/dL — ABNORMAL HIGH (ref 65–99)
Glucose-Capillary: 100 mg/dL — ABNORMAL HIGH (ref 65–99)

## 2016-08-19 LAB — CBC
HCT: 24.2 % — ABNORMAL LOW (ref 36.0–46.0)
Hemoglobin: 7.7 g/dL — ABNORMAL LOW (ref 12.0–15.0)
MCH: 29.6 pg (ref 26.0–34.0)
MCHC: 31.8 g/dL (ref 30.0–36.0)
MCV: 93.1 fL (ref 78.0–100.0)
Platelets: 287 10*3/uL (ref 150–400)
RBC: 2.6 MIL/uL — ABNORMAL LOW (ref 3.87–5.11)
RDW: 13.4 % (ref 11.5–15.5)
WBC: 4.2 10*3/uL (ref 4.0–10.5)

## 2016-08-19 LAB — MAGNESIUM: Magnesium: 1.8 mg/dL (ref 1.7–2.4)

## 2016-08-19 LAB — PHOSPHORUS: Phosphorus: 3.5 mg/dL (ref 2.5–4.6)
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
## 2016-08-19 NOTE — Progress Notes (Signed)
Progress Note: General Surgery Service   Chief Complaint: No chief complaint on file.  Subjective: Continued pain, +BM yesterday, tolerating liquids, TF at goal, no nausea  Objective: Vital signs in last 24 hours: Temp:  [97.4 F (36.3 C)-98.8 F (37.1 C)] 97.4 F (36.3 C) (04/26 6295) Pulse Rate:  [100-108] 100 (04/26 0611) Resp:  [16-18] 18 (04/26 0611) BP: (109-117)/(71-72) 117/71 (04/26 0611) SpO2:  [99 %-100 %] 99 % (04/26 0611) Weight:  [56.9 kg (125 lb 6.4 oz)-57.3 kg (126 lb 4.8 oz)] 56.9 kg (125 lb 6.4 oz) (04/26 0642) Last BM Date: 08/17/16  Intake/Output from previous day: 04/25 0701 - 04/26 0700 In: 2841 [P.O.:360; I.V.:640; NG/GT:555] Out: 3505 [Urine:3450; Drains:55] Intake/Output this shift: No intake/output data recorded.  Lungs: CTAB  Cardiovascular: RRR  Abd: soft, TTP left side, incisions c/d/i  Extremities: no edema  Neuro: AOx4  Lab Results: CBC   Recent Labs  08/18/16 0458 08/19/16 0400  WBC 2.8* 4.2  HGB 7.5* 7.7*  HCT 23.8* 24.2*  PLT 296 287   BMET  Recent Labs  08/18/16 0458 08/19/16 0400  NA 137 136  K 3.8 4.0  CL 112* 113*  CO2 18* 18*  GLUCOSE 167* 139*  BUN 8 11  CREATININE 0.52 0.46  CALCIUM 7.8* 8.3*   PT/INR No results for input(s): LABPROT, INR in the last 72 hours. ABG No results for input(s): PHART, HCO3 in the last 72 hours.  Invalid input(s): PCO2, PO2  Studies/Results:  Anti-infectives: Anti-infectives    Start     Dose/Rate Route Frequency Ordered Stop   08/16/16 1329  cefoTEtan in Dextrose 5% (CEFOTAN) 2-2.08 GM-% IVPB    Comments:  Bridget Hartshorn   : cabinet override      08/16/16 1329 08/16/16 1436   08/16/16 1056  cefoTEtan in Dextrose 5% (CEFOTAN) IVPB 2 g     2 g Intravenous On call to O.R. 08/16/16 1058 08/16/16 1456      Medications: Scheduled Meds: . ALPRAZolam  1 mg Oral TID  . feeding supplement  1 Container Oral TID BM  . heparin  5,000 Units Subcutaneous Q8H  . lidocaine  1  application Topical BID  . loratadine  10 mg Oral Daily  . QUEtiapine  100 mg Oral QHS  . ranitidine  150 mg Oral BID   Continuous Infusions: . dextrose 5 % and 0.45 % NaCl with KCl 20 mEq/L 20 mL/hr at 08/18/16 1800  . feeding supplement (JEVITY 1.2 CAL) 1,000 mL (08/17/16 2132)   PRN Meds:.acetaminophen (TYLENOL) oral liquid 160 mg/5 mL, cyclobenzaprine, diphenhydrAMINE **OR** diphenhydrAMINE, HYDROmorphone (DILAUDID) injection, ondansetron **OR** ondansetron (ZOFRAN) IV, oxyCODONE, oxymetazoline, simethicone, sodium chloride flush  Assessment/Plan: Patient Active Problem List   Diagnosis Date Noted  . Malnutrition following gastrointestinal surgery 08/16/2016  . Anastomotic ulcer 07/21/2016  . Abdominal pain 07/21/2016  . Transient alteration of awareness   . Alcohol use 04/27/2016  . Chronic pain syndrome 04/27/2016  . Iron deficiency anemia 04/27/2016  . B12 deficiency 04/27/2016  . Malnutrition ([place])   . Gastrojejunal anastomotic stricture   . Hypoglycemia 04/22/2016  . Syncope 04/22/2016  . Protein-calorie malnutrition, severe 04/22/2016  . Nausea and vomiting   . Abdominal pain, chronic, epigastric   . Hypotension 02/01/2016  . Hyponatremia 02/01/2016  . Acute kidney injury ([place]) 02/01/2016  . Subacromial impingement of left shoulder 07/06/2013  . Trochanteric bursitis of left hip 05/11/2013  . Absolute anemia 07/24/2012  . Back pain complicating pregnancy 32/44/0102  . AMA (advanced  maternal age) multigravida 35+ 05/16/2012  . Insufficient prenatal care 05/16/2012  . Previous cesarean section 05/16/2012  . Arthritis 05/16/2012  . S/P gastric bypass 05/16/2012   s/p Procedure(s): LAPAROSCOPIC INSERTION OF GASTROSTOMY TUBE IN REMNANT STOMACH 08/16/2016 -electrolytes normal -continue PO meds, wean IV meds -ambuate    LOS: 2 days   [name], MD Pg# [phone] [facility]
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
## 2016-08-19 NOTE — Care Management Obs Status (Signed)
[place] NOTIFICATION   Patient Details  Name: Angie [name] MRN: [id number] Date of Birth: 04-19-1966   Medicare Observation Status Notification Given:  Yes    Guadalupe Maple, RN 08/19/2016, 9:48 AM

## 2016-08-20 DIAGNOSIS — K289 Gastrojejunal ulcer, unspecified as acute or chronic, without hemorrhage or perforation: Secondary | ICD-10-CM | POA: Diagnosis present

## 2016-08-20 DIAGNOSIS — E43 Unspecified severe protein-calorie malnutrition: Secondary | ICD-10-CM | POA: Diagnosis present

## 2016-08-20 DIAGNOSIS — I10 Essential (primary) hypertension: Secondary | ICD-10-CM | POA: Diagnosis present

## 2016-08-20 DIAGNOSIS — Z800 Family history of malignant neoplasm of digestive organs: Secondary | ICD-10-CM | POA: Diagnosis not present

## 2016-08-20 DIAGNOSIS — K3189 Other diseases of stomach and duodenum: Secondary | ICD-10-CM | POA: Diagnosis present

## 2016-08-20 DIAGNOSIS — K660 Peritoneal adhesions (postprocedural) (postinfection): Secondary | ICD-10-CM | POA: Diagnosis present

## 2016-08-20 DIAGNOSIS — G894 Chronic pain syndrome: Secondary | ICD-10-CM | POA: Diagnosis present

## 2016-08-20 DIAGNOSIS — R64 Cachexia: Secondary | ICD-10-CM | POA: Diagnosis present

## 2016-08-20 DIAGNOSIS — Z9884 Bariatric surgery status: Secondary | ICD-10-CM | POA: Diagnosis not present

## 2016-08-20 DIAGNOSIS — Z6821 Body mass index (BMI) 21.0-21.9, adult: Secondary | ICD-10-CM | POA: Diagnosis not present

## 2016-08-20 DIAGNOSIS — Z79891 Long term (current) use of opiate analgesic: Secondary | ICD-10-CM | POA: Diagnosis not present

## 2016-08-20 DIAGNOSIS — Z87891 Personal history of nicotine dependence: Secondary | ICD-10-CM | POA: Diagnosis not present

## 2016-08-20 DIAGNOSIS — F319 Bipolar disorder, unspecified: Secondary | ICD-10-CM | POA: Diagnosis present

## 2016-08-20 DIAGNOSIS — Z833 Family history of diabetes mellitus: Secondary | ICD-10-CM | POA: Diagnosis not present

## 2016-08-20 DIAGNOSIS — M797 Fibromyalgia: Secondary | ICD-10-CM | POA: Diagnosis present

## 2016-08-20 DIAGNOSIS — Z8249 Family history of ischemic heart disease and other diseases of the circulatory system: Secondary | ICD-10-CM | POA: Diagnosis not present

## 2016-08-20 DIAGNOSIS — E739 Lactose intolerance, unspecified: Secondary | ICD-10-CM | POA: Diagnosis present

## 2016-08-20 DIAGNOSIS — E46 Unspecified protein-calorie malnutrition: Secondary | ICD-10-CM | POA: Diagnosis present

## 2016-08-20 DIAGNOSIS — Z818 Family history of other mental and behavioral disorders: Secondary | ICD-10-CM | POA: Diagnosis not present

## 2016-08-20 LAB — GLUCOSE, CAPILLARY
Glucose-Capillary: 105 mg/dL — ABNORMAL HIGH (ref 65–99)
Glucose-Capillary: 110 mg/dL — ABNORMAL HIGH (ref 65–99)
Glucose-Capillary: 127 mg/dL — ABNORMAL HIGH (ref 65–99)
Glucose-Capillary: 74 mg/dL (ref 65–99)
Glucose-Capillary: 90 mg/dL (ref 65–99)
Glucose-Capillary: 92 mg/dL (ref 65–99)

## 2016-08-20 MED ORDER — OXYCODONE HCL 5 MG/5ML PO SOLN: 10.0000 mg | ORAL | 0 refills | Status: DC | PRN

## 2016-08-20 MED ORDER — CYCLOBENZAPRINE HCL 5 MG PO TABS: 5.0000 mg | ORAL_TABLET | Freq: Three times a day (TID) | ORAL | 0 refills | Status: DC | PRN

## 2016-08-20 MED ORDER — PANCRELIPASE (LIP-PROT-AMYL) 12000-38000 UNITS PO CPEP
36000.0000 [IU] | ORAL_CAPSULE | Freq: Once | ORAL | Status: DC
  Filled 2016-08-20: qty 3

## 2016-08-20 MED ORDER — JEVITY 1.2 CAL PO LIQD: 1000.0000 mL | ORAL | 6 refills | Status: DC

## 2016-08-20 MED ORDER — JEVITY 1.5 CAL/FIBER PO LIQD: 1000.0000 mL | ORAL | 6 refills | Status: DC

## 2016-08-20 MED ORDER — SODIUM BICARBONATE 650 MG PO TABS
650.0000 mg | ORAL_TABLET | Freq: Once | ORAL | Status: DC
  Filled 2016-08-20: qty 1

## 2016-08-20 MED ORDER — ONDANSETRON 4 MG PO TBDP: 4.0000 mg | ORAL_TABLET | Freq: Three times a day (TID) | ORAL | 0 refills | Status: DC | PRN

## 2016-08-20 NOTE — NC FL2 (Signed)
[place] LEVEL OF CARE SCREENING TOOL     IDENTIFICATION  Patient Name: Angie [name] [place] Birthdate: 09/20/1965 Sex: female Admission Date (Current Location): 08/16/2016  [facility] and [place] Number:  Herbalist and Address:  [facility],  [place], [place]      Provider Number: [id number]  Attending Physician Name and Address:  Arta Bruce Kinsinger, MD  Relative Name and Phone Number:       Current Level of Care: Hospital Recommended Level of Care: [place] Prior Approval Number:    Date Approved/Denied:   PASRR Number: [id number]  Discharge Plan: SNF    Current Diagnoses: Patient Active Problem List   Diagnosis Date Noted  . Malnutrition following gastrointestinal surgery 08/16/2016  . Anastomotic ulcer 07/21/2016  . Abdominal pain 07/21/2016  . Transient alteration of awareness   . Alcohol use 04/27/2016  . Chronic pain syndrome 04/27/2016  . Iron deficiency anemia 04/27/2016  . B12 deficiency 04/27/2016  . Malnutrition ([place])   . Gastrojejunal anastomotic stricture   . Hypoglycemia 04/22/2016  . Syncope 04/22/2016  . Protein-calorie malnutrition, severe 04/22/2016  . Nausea and vomiting   . Abdominal pain, chronic, epigastric   . Hypotension 02/01/2016  . Hyponatremia 02/01/2016  . Acute kidney injury ([place]) 02/01/2016  . Subacromial impingement of left shoulder 07/06/2013  . Trochanteric bursitis of left hip 05/11/2013  . Absolute anemia 07/24/2012  . Back pain complicating pregnancy 72/12/4707  . AMA (advanced maternal age) multigravida 35+ 05/16/2012  . Insufficient prenatal care 05/16/2012  . Previous cesarean section 05/16/2012  . Arthritis 05/16/2012  . S/P gastric bypass 05/16/2012    Orientation RESPIRATION BLADDER Height & Weight     Self, Situation, Time, Place  Normal Continent Weight: 138 lb 3.7 oz (62.7 kg) Height:  5' (152.4 cm)  BEHAVIORAL SYMPTOMS/MOOD NEUROLOGICAL  BOWEL NUTRITION STATUS      Incontinent Diet, Feeding tube (full liquid)  AMBULATORY STATUS COMMUNICATION OF NEEDS Skin   Limited Assist Verbally Surgical wounds                       Personal Care Assistance Level of Assistance  Bathing, Dressing Bathing Assistance: Limited assistance   Dressing Assistance: Limited assistance     Functional Limitations Info             SPECIAL CARE FACTORS FREQUENCY  PT (By licensed PT), OT (By licensed OT)     PT Frequency: 5/wk OT Frequency: 5/wk            Contractures      Additional Factors Info  Code Status, Allergies Code Status Info: FULL Allergies Info: Iron           Current Medications (08/20/2016):  This is the current hospital active medication list Current Facility-Administered Medications  Medication Dose Route Frequency Provider Last Rate Last Dose  . acetaminophen (TYLENOL) solution 500 mg  500 mg Per Tube Q6H PRN [name], MD   500 mg at 08/20/16 0528  . ALPRAZolam Duanne Moron) tablet 1 mg  1 mg Oral TID [name], MD   1 mg at 08/20/16 1026  . cyclobenzaprine (FLEXERIL) tablet 5 mg  5 mg Oral TID PRN [name], MD   5 mg at 08/20/16 0528  . diphenhydrAMINE (BENADRYL) 12.5 MG/5ML elixir 12.5 mg  12.5 mg Oral Q6H PRN [name], MD       Or  . diphenhydrAMINE (BENADRYL)  injection 12.5 mg  12.5 mg Intravenous Q6H PRN [name], MD      . feeding supplement (BOOST / RESOURCE BREEZE) liquid 1 Container  1 Container Oral TID BM [name], MD   1 Container at 08/19/16 1946  . feeding supplement (JEVITY 1.2 CAL) liquid 1,000 mL  1,000 mL Per Tube Continuous Arta Bruce Kinsinger, MD 50 mL/hr at 08/20/16 0600 1,000 mL at 08/20/16 0600  . heparin injection 5,000 Units  5,000 Units Subcutaneous Q8H [name], MD   5,000 Units at 08/20/16 [phone]  . HYDROmorphone (DILAUDID) injection 0.5 mg  0.5 mg Intravenous Q2H PRN [name], MD   0.5 mg  at 08/19/16 1945  . lidocaine (XYLOCAINE) 5 % ointment 1 application  1 application Topical BID [name], MD   1 application at 10/17/74 1027  . lipase/protease/amylase (CREON) capsule 36,000 Units  36,000 Units Oral Once Arta Bruce Kinsinger, MD       And  . sodium bicarbonate tablet 650 mg  650 mg Oral Once Arta Bruce Kinsinger, MD      . loratadine (CLARITIN) tablet 10 mg  10 mg Oral Daily Arta Bruce Kinsinger, MD   10 mg at 08/20/16 1026  . ondansetron (ZOFRAN-ODT) disintegrating tablet 4 mg  4 mg Oral Q6H PRN [name], MD       Or  . ondansetron [facility]) injection 4 mg  4 mg Intravenous Q6H PRN Arta Bruce Kinsinger, MD      . oxyCODONE (ROXICODONE) 5 MG/5ML solution 10-15 mg  10-15 mg Per Tube Q4H PRN [name], MD   10 mg at 08/20/16 1026  . oxymetazoline (AFRIN) 0.05 % nasal spray 1 spray  1 spray Each Nare BID PRN Arta Bruce Kinsinger, MD      . QUEtiapine (SEROQUEL) tablet 100 mg  100 mg Oral QHS [name], MD   100 mg at 08/19/16 2212  . ranitidine (ZANTAC) 150 MG/10ML syrup 150 mg  150 mg Oral BID [name], MD   150 mg at 08/20/16 1024  . simethicone (MYLICON) chewable tablet 40 mg  40 mg Oral Q6H PRN Arta Bruce Kinsinger, MD      . sodium chloride flush (NS) 0.9 % injection 10-40 mL  10-40 mL Intracatheter PRN [name], MD   20 mL at 08/20/16 1210     Discharge Medications: Please see discharge summary for a list of discharge medications.  Relevant Imaging Results:  Relevant Lab Results:   Additional Information SS#: [id number]  Jorge Ny, LCSW

## 2016-08-20 NOTE — Progress Notes (Addendum)
Progress Note: General Surgery Service   Chief Complaint: No chief complaint on file.  Subjective: 2 BMs overnight, unable to get to toilet for one. Menses started and abdominal pain now worse due to that. No nausea, tolerating TF.  Objective: Vital signs in last 24 hours: Temp:  [98.2 F (36.8 C)-99.4 F (37.4 C)] 98.2 F (36.8 C) (04/27 0421) Pulse Rate:  [95-109] 98 (04/27 0421) Resp:  [17-18] 18 (04/27 0421) BP: (102-129)/(59-69) 111/67 (04/27 0421) SpO2:  [100 %] 100 % (04/27 0421) Weight:  [62.7 kg (138 lb 3.7 oz)] 62.7 kg (138 lb 3.7 oz) (04/27 0421) Last BM Date: 08/19/16  Intake/Output from previous day: 04/26 0701 - 04/27 0700 In: 2789.3 [P.O.:1270; I.V.:470; NG/GT:1049.3] Out: 1350 [Urine:1350] Intake/Output this shift: No intake/output data recorded.  Lungs: CTAB  Cardiovascular: RRR  Abd: soft, ATTP, ND  Extremities: no edema  Neuro: AOx4  Lab Results: CBC   Recent Labs  08/18/16 0458 08/19/16 0400  WBC 2.8* 4.2  HGB 7.5* 7.7*  HCT 23.8* 24.2*  PLT 296 287   BMET  Recent Labs  08/18/16 0458 08/19/16 0400  NA 137 136  K 3.8 4.0  CL 112* 113*  CO2 18* 18*  GLUCOSE 167* 139*  BUN 8 11  CREATININE 0.52 0.46  CALCIUM 7.8* 8.3*   PT/INR No results for input(s): LABPROT, INR in the last 72 hours. ABG No results for input(s): PHART, HCO3 in the last 72 hours.  Invalid input(s): PCO2, PO2  Studies/Results:  Anti-infectives: Anti-infectives    Start     Dose/Rate Route Frequency Ordered Stop   08/16/16 1329  cefoTEtan in Dextrose 5% (CEFOTAN) 2-2.08 GM-% IVPB    Comments:  Angie [name]   : cabinet override      08/16/16 1329 08/16/16 1436   08/16/16 1056  cefoTEtan in Dextrose 5% (CEFOTAN) IVPB 2 g     2 g Intravenous On call to O.R. 08/16/16 1058 08/16/16 1456      Medications: Scheduled Meds: . ALPRAZolam  1 mg Oral TID  . feeding supplement  1 Container Oral TID BM  . heparin  5,000 Units Subcutaneous Q8H  . lidocaine   1 application Topical BID  . loratadine  10 mg Oral Daily  . QUEtiapine  100 mg Oral QHS  . ranitidine  150 mg Oral BID   Continuous Infusions: . dextrose 5 % and 0.45 % NaCl with KCl 20 mEq/L 20 mL/hr at 08/20/16 0600  . feeding supplement (JEVITY 1.2 CAL) 1,000 mL (08/20/16 0600)   PRN Meds:.acetaminophen (TYLENOL) oral liquid 160 mg/5 mL, cyclobenzaprine, diphenhydrAMINE **OR** diphenhydrAMINE, HYDROmorphone (DILAUDID) injection, ondansetron **OR** ondansetron (ZOFRAN) IV, oxyCODONE, oxymetazoline, simethicone, sodium chloride flush  Assessment/Plan: Patient Active Problem List   Diagnosis Date Noted  . Malnutrition following gastrointestinal surgery 08/16/2016  . Anastomotic ulcer 07/21/2016  . Abdominal pain 07/21/2016  . Transient alteration of awareness   . Alcohol use 04/27/2016  . Chronic pain syndrome 04/27/2016  . Iron deficiency anemia 04/27/2016  . B12 deficiency 04/27/2016  . Malnutrition ([place])   . Gastrojejunal anastomotic stricture   . Hypoglycemia 04/22/2016  . Syncope 04/22/2016  . Protein-calorie malnutrition, severe 04/22/2016  . Nausea and vomiting   . Abdominal pain, chronic, epigastric   . Hypotension 02/01/2016  . Hyponatremia 02/01/2016  . Acute kidney injury ([place]) 02/01/2016  . Subacromial impingement of left shoulder 07/06/2013  . Trochanteric bursitis of left hip 05/11/2013  . Absolute anemia 07/24/2012  . Back pain complicating pregnancy 14/38/8875  .  AMA (advanced maternal age) multigravida 35+ 05/16/2012  . Insufficient prenatal care 05/16/2012  . Previous cesarean section 05/16/2012  . Arthritis 05/16/2012  . S/P gastric bypass 05/16/2012   s/p Procedure(s): LAPAROSCOPIC INSERTION OF GASTROSTOMY TUBE IN REMNANT STOMACH 08/16/2016 Angie [name] has made no improvements on pain, she continues to require a high level of support. I think temporary nursing facility is in her best interest at this time -SNF placement -increase benefiber due to BM  frequency, f/u RD recs   LOS: 2 days   [name], MD Pg# [phone] [facility]

## 2016-08-20 NOTE — Progress Notes (Signed)
Had trouble with the g-tube today as far as getting the feeding tube to stay in the g-tube.  It felt as if it was being pushed back out by air pressure.  PA looked at it and flushed it and it flushed with no problem.  It had to be taped to stay in.

## 2016-08-20 NOTE — Discharge Summary (Signed)
Physician Discharge Summary  Angie [name] PXT:[id number] DOB: October 29, 1965 DOA: 08/16/2016  PCP: Ricke Hey, MD  Admit date: 08/16/2016 Discharge date: 08/20/2016  Recommendations for Outpatient Follow-up:  1. Patient to go to temporary SNF for Physical therapy and medication/nutritional assistance` (include homehealth, outpatient follow-up instructions, specific recommendations for PCP to follow-up on, etc.)   2.  Jevity feedings per G tube  Discharge Diagnoses:  Active Problems:   Malnutrition ([place])   Malnutrition following gastrointestinal surgery  Surgical Procedure: laparoscopic G tube into gastric remnant - 08/16/2016 - Kinsinger  Discharge Condition: Good Disposition: Home  Diet recommendation: bariatric full liquid diet orally. 39ml/h Jevity 1.5 w fiber to G tube continuous  -Note G tube is resting in gastric remnant and is not synchronous with her gastric pouch which is fed orally.   Hospital Course:  51 yo female s/p gastric bypass in 2003 who had significant weight loss over the past year was diagnosed with stricture from marginal ulcer which failed endoscopic therapy.   She presented to the hospital to undergo G tube placement into gastric remnant on 08/16/2016 by Dr. Kieth Brightly. The procedure was performed successfully and she was admitted to the hospital afterward.   Due to her chronic narcotic use, she had greater than average pain and pain medication requirement as well as lower than expected ambulation. She had return of bowel function POD 3 and was at goal on tube feeds by POD 3.   However, she is not ready to go home to care for yourself and therefore, social work was consulted to assess for nursing facility placement ([place])  Discharge Instructions  Discharge Instructions    Ambulate hourly while awake    Complete by:  As directed    Call MD for:  difficulty breathing, headache or visual disturbances    Complete by:  As  directed    Call MD for:  persistant dizziness or light-headedness    Complete by:  As directed    Call MD for:  persistant nausea and vomiting    Complete by:  As directed    Call MD for:  redness, tenderness, or signs of infection (pain, swelling, redness, odor or green/yellow discharge around incision site)    Complete by:  As directed    Call MD for:  severe uncontrolled pain    Complete by:  As directed    Call MD for:  temperature >101 F    Complete by:  As directed    Diet bariatric full liquid    Complete by:  As directed    Discharge wound care:    Complete by:  As directed    Remove Bandaids tomorrow, ok to shower tomorrow. Steristrips may fall off in 1-3 weeks.  Keep dressing over G tube site.   Incentive spirometry    Complete by:  As directed    Perform hourly while awake     Resume meds she was on prior to admission:  Flexeril 5 mg TID Xanax 1 mg TID Zofran 4 mg prn nausea Afrin 1 spray both nostrils BID prn Seroquel 100 mg q hs prn Zyrtek 10 mg po QD prn for allergies Pepcid 20 mg BID (in suspension) Jevity 1.5 Lidocaine 5% ointment - for shoulder and back pain Lyrica 50 mg TID  Oxycodone elixir prn pain    The results of significant diagnostics from this hospitalization (including imaging, microbiology, ancillary and laboratory) are listed below for reference.    Significant Diagnostic  Studies: No results found.  Labs: Basic Metabolic Panel:  Recent Labs Lab 08/16/16 1840 08/17/16 0702 08/18/16 0458 08/19/16 0400  NA  --  138 137 136  K  --  3.8 3.8 4.0  CL  --  109 112* 113*  CO2  --  23 18* 18*  GLUCOSE  --  91 167* 139*  BUN  --  12 8 11   CREATININE 0.66 0.53 0.52 0.46  CALCIUM  --  8.2* 7.8* 8.3*  MG  --   --   --  1.8  PHOS  --   --   --  3.5   Liver Function Tests:  Recent Labs Lab 08/19/16 0400  AST 24  ALT 18  ALKPHOS 64  BILITOT 0.1*  PROT 6.1*  ALBUMIN 3.1*    CBC:  Recent Labs Lab 08/16/16 1840  08/17/16 0702 08/18/16 0458 08/19/16 0400  WBC 7.3 5.6 2.8* 4.2  HGB 8.6* 8.4* 7.5* 7.7*  HCT 26.0* 24.8* 23.8* 24.2*  MCV 91.2 93.6 93.7 93.1  PLT 339 293 296 287    CBG:  Recent Labs Lab 08/20/16 0007 08/20/16 0419 08/20/16 0743 08/20/16 1217 08/20/16 1657  GLUCAP 110* 127* 92 105* 90    Active Problems:   Malnutrition ([place])   Malnutrition following gastrointestinal surgery   Follow up with Dr. Kieth Brightly in 2 to 4 weeks.  Time coordinating discharge: 60 minutes   Alphonsa Overall, MD, [facility] Pager: [phone] Office phone:  [phone]

## 2016-08-20 NOTE — Progress Notes (Signed)
Pt chooses SNF bed at [place] can accept once patient has PT note so they can get prior auth from insurance  CSW will continue to follow- likely DC 4/28 after PT eval  Jorge Ny MSW, LCSW [facility] #: [phone]

## 2016-08-21 LAB — GLUCOSE, CAPILLARY
Glucose-Capillary: 119 mg/dL — ABNORMAL HIGH (ref 65–99)
Glucose-Capillary: 136 mg/dL — ABNORMAL HIGH (ref 65–99)
Glucose-Capillary: 177 mg/dL — ABNORMAL HIGH (ref 65–99)
Glucose-Capillary: 96 mg/dL (ref 65–99)

## 2016-08-21 NOTE — Progress Notes (Signed)
PT Note  Patient Details Name: Angie [name] MRN: [id number] DOB: 1965-09-11         checked on pt this morning, very polite, remembered her from recent [facility] admission. Would like to eat her ice cream prior to our session, so will return within the hour to assess pt.     Clide Dales 08/21/2016, 9:47 AM  Clide Dales, PT Pager: [phone] 08/21/2016

## 2016-08-21 NOTE — Evaluation (Signed)
Physical Therapy Evaluation Patient Details Name: Angie [name] MRN: [id number] DOB: [birth year] Today's Date: 08/21/2016   History of Present Illness  51 yo female s/p gastric bypass in 2003 who had significant weight loss over the past year was diagnosed with stricture from marginal ulcer which failed endoscopic therapy.   Clinical Impression  Pt with significant medical conditions and currently with abdominal surgery and feeding tube presents with great abdominal pain and weakness in B LEs and all over. This is preventing her at this time to function independently and will need further rehab at [facility] to assist with increasing strength and Mobility .     Follow Up Recommendations SNF    Equipment Recommendations  Rolling walker with 5" wheels    Recommendations for Other Services       Precautions / Restrictions Precautions Precaution Comments: feeding tube , abdominal surgery and wears abdominal binder for support and pain  Restrictions Weight Bearing Restrictions: No      Mobility  Bed Mobility Overal bed mobility: Needs Assistance Bed Mobility: Sidelying to Sit;Sit to Sidelying   Sidelying to sit: Mod assist     Sit to sidelying: Mod assist General bed mobility comments: educated with bracing abdominal area and to stay in curled up (flexed position from sit to side lying to prevent strain. Pt agreed this helped. Will ahve to conitnue to cue patient for this technique.   Transfers Overall transfer level: Needs assistance Equipment used: Rolling walker (2 wheeled) Transfers: Sit to/from Stand Sit to Stand: Min assist         General transfer comment: slow and cues for safety wtih AD   Ambulation/Gait Ambulation/Gait assistance: Mod assist;+2 safety/equipment Ambulation Distance (Feet): 17 Feet Assistive device: Rolling walker (2 wheeled)   Gait velocity: very slow 15 minutes for 17 feet    General Gait Details: small, slow steps, with flexed posture with  abdominal pain and , contstant cues for slowing breathing pattern down and to relax. towrads end of wlak did have to give more support for patietn was getting weak in LEs a lot and could feel in her stance . Also walked to bathroom prior to her walk for 7 feet with PT.   Stairs Stairs:  (would not be able to at this time )          Wheelchair Mobility    Modified Rankin (Stroke Patients Only)       Balance                                             Pertinent Vitals/Pain Pain Assessment: 0-10 Pain Score: 10-Worst pain ever Pain Location: left abdominal area , especially with moving and bed mobility. Doesn't come up to full upright stance due to pain pt states in Left abdomen Pain Descriptors / Indicators: Aching;Burning Pain Intervention(s): Limited activity within patient's tolerance;Other (comment) (showed technique of bracing the abdominal area and correct bed mobility technique to minimize the abdominal strain. Pt agreed it helped. )    Home Living Family/patient expects to be discharged to:: Skilled nursing facility (for [facility] then return to home, unsure of where that may be , see all SW notes ) Living Arrangements:  (was living with ex boyfriend of whom helps with their 40 yo child) Available Help at Discharge:  (unsure)  Prior Function Level of Independence: Needs assistance   Gait / Transfers Assistance Needed: pt stated she has been weak and not doing too well            Hand Dominance        Extremity/Trunk Assessment        Lower Extremity Assessment Lower Extremity Assessment: Generalized weakness (very weak and deconditioned with walking and mobility )    Cervical / Trunk Assessment Cervical / Trunk Assessment: Kyphotic  Communication   Communication: No difficulties (very pleasant and cooperative, tries hard. )  Cognition Arousal/Alertness: Awake/alert Behavior During Therapy: WFL for tasks  assessed/performed Overall Cognitive Status: Within Functional Limits for tasks assessed                                        General Comments      Exercises     Assessment/Plan    PT Assessment Patient needs continued PT services  PT Problem List Decreased strength;Decreased activity tolerance;Decreased mobility       PT Treatment Interventions Gait training;Functional mobility training;Therapeutic activities;Therapeutic exercise;Patient/family education    PT Goals (Current goals can be found in the Care Plan section)  Acute Rehab PT Goals Patient Stated Goal: I want to get better , I just know I am so weak and this pain is a lot.  PT Goal Formulation: With patient Time For Goal Achievement: 09/04/16 Potential to Achieve Goals: Good    Frequency Min 3X/week   Barriers to discharge        Co-evaluation               End of Session   Activity Tolerance: Patient limited by fatigue;Patient tolerated treatment well Patient left: in bed;with call bell/phone within reach;with bed alarm set;with nursing/sitter in room Nurse Communication: Mobility status PT Visit Diagnosis: Muscle weakness (generalized) (M62.81)    Time: 1030-1102 PT Time Calculation (min) (ACUTE ONLY): 32 min   Charges:   PT Evaluation $PT Eval Moderate Complexity: 1 Procedure PT Treatments $Gait Training: 8-22 mins   PT G Codes:   PT G-Codes **NOT FOR INPATIENT CLASS** Functional Limitation: Mobility: Walking and moving around   Kimberly[place]Cl[place], [place] Pager: [phone] 08/21/2016   Bailei Buist, Gatha Mayer 08/21/2016, 12:28 PM

## 2016-08-21 NOTE — Progress Notes (Signed)
Patient ID: Angie [name], female   DOB: [birth year], 51 y.o.   MRN: [id number] 5 Days Post-Op   Subjective: Complaining of generalized body pain and pain at surgical site. Tube feeding has been going well.  Objective: Vital signs in last 24 hours: Temp:  [97.6 F (36.4 C)-98.1 F (36.7 C)] 97.6 F (36.4 C) (04/28 0615) Pulse Rate:  [96-105] 96 (04/28 0615) Resp:  [18] 18 (04/28 0615) BP: (98-100)/(64-66) 98/64 (04/28 0615) SpO2:  [100 %] 100 % (04/28 0615) Weight:  [63.4 kg (139 lb 12.4 oz)] 63.4 kg (139 lb 12.4 oz) (04/28 0528) Last BM Date: 08/21/16  Intake/Output from previous day: 04/27 0701 - 04/28 0700 In: 1400 [P.O.:560; I.V.:90; NG/GT:750] Out: 680 [Urine:300; Drains:380] Intake/Output this shift: No intake/output data recorded.  General appearance: severe distress and Drowsy but conversant and in no apparent distress Resp: No wheezing or increased work of breathing GI: Nondistended. Minimal tenderness. G-tube site dressed and clean.  Lab Results:   Recent Labs  08/19/16 0400  WBC 4.2  HGB 7.7*  HCT 24.2*  PLT 287   BMET  Recent Labs  08/19/16 0400  NA 136  K 4.0  CL 113*  CO2 18*  GLUCOSE 139*  BUN 11  CREATININE 0.46  CALCIUM 8.3*     Studies/Results: No results found.  Anti-infectives: Anti-infectives    Start     Dose/Rate Route Frequency Ordered Stop   08/16/16 1329  cefoTEtan in Dextrose 5% (CEFOTAN) 2-2.08 GM-% IVPB    Comments:  Bridget Hartshorn   : cabinet override      08/16/16 1329 08/16/16 1436   08/16/16 1056  cefoTEtan in Dextrose 5% (CEFOTAN) IVPB 2 g     2 g Intravenous On call to O.R. 08/16/16 1058 08/16/16 1456      Assessment/Plan: s/p Procedure(s): LAPAROSCOPIC INSERTION OF GASTROSTOMY TUBE IN REMNANT STOMACH Stable post surgery with multiple medical issues and chronic pain. Physical therapy to see this morning for qualification for SNF   LOS: 3 days    Angie [name] T 08/21/2016

## 2016-08-21 NOTE — Clinical Social Work Placement (Signed)
   CLINICAL SOCIAL WORK PLACEMENT  NOTE  Date:  08/21/2016  Patient Details  Name: Angie [name] MRN: [id number] Date of Birth: 1965/04/28  Clinical Social Work is seeking post-discharge placement for this patient at the [place] level of care (*CSW will initial, date and re-position this form in  chart as items are completed):      Patient/family provided with [place] Work Department's list of facilities offering this level of care within the geographic area requested by the patient (or if unable, by the patient's family).      Patient/family informed of their freedom to choose among providers that offer the needed level of care, that participate in Medicare, Medicaid or managed care program needed by the patient, have an available bed and are willing to accept the patient.      Patient/family informed of [facility]'s ownership interest in [facility] and [facility], as well as of the fact that they are under no obligation to receive care at these facilities.  PASRR submitted to EDS on       PASRR number received on 08/20/16     Existing PASRR number confirmed on       FL2 transmitted to all facilities in geographic area requested by pt/family on 08/20/16     FL2 transmitted to all facilities within larger geographic area on       Patient informed that his/her managed care company has contracts with or will negotiate with certain facilities, including the following:        Yes   Patient/family informed of bed offers received.  Patient chooses bed at [facility]     Physician recommends and patient chooses bed at      Patient to be transferred to [facility] on 08/21/16.  Patient to be transferred to facility by [facility]     Patient family notified on 08/21/16 of transfer.  Name of family member notified:  Patient states she will call her daughter;  her son  is coming to the hospital after 3 pm prior to d/c.     PHYSICIAN       Additional Comment:  DC today to SNF for rehab and G- tube care.  Patient is agreeable to d/c.  RN to call report to facility; DC summary sent to SNF for review.  Bed is available per Lynelle Smoke, Database administrator for [employer].  No further SW intervention is needed and will sign off.     _______________________________________________ Williemae Area, LCSW 08/21/2016, 1:42 PM

## 2016-08-21 NOTE — Progress Notes (Signed)
Pt report was called in to Vicenta Dunning at [facility]. All questions were answered, AVS was printed, prescriptions were sent to facility with [facility].

## 2016-08-23 ENCOUNTER — Non-Acute Institutional Stay (SKILLED_NURSING_FACILITY): Payer: Medicare Other | Admitting: Adult Health

## 2016-08-23 ENCOUNTER — Encounter: Payer: Self-pay | Admitting: Adult Health

## 2016-08-23 DIAGNOSIS — D508 Other iron deficiency anemias: Secondary | ICD-10-CM

## 2016-08-23 DIAGNOSIS — R1013 Epigastric pain: Secondary | ICD-10-CM

## 2016-08-23 DIAGNOSIS — E43 Unspecified severe protein-calorie malnutrition: Secondary | ICD-10-CM

## 2016-08-23 DIAGNOSIS — G8929 Other chronic pain: Secondary | ICD-10-CM

## 2016-08-23 DIAGNOSIS — G894 Chronic pain syndrome: Secondary | ICD-10-CM | POA: Diagnosis not present

## 2016-08-23 NOTE — Progress Notes (Signed)
Location:   [facility] Room Number: 109A Place of Service:  SNF (31)   CODE STATUS: Full Code  Allergies  Allergen Reactions  . Iron     Had acute allergy with tachycardia, attention and generalized itching shortly after receiving nulecit ( iron gluconate) infusion.    Chief Complaint  Patient presents with  . Hospitalization Follow-up    Hospital follow up    HPI:  She has been hospitalized for her malnutrition. She has had g-tube placed for nutritional support. She did have pain management issues while in the hospital. She is here for short term rehab with her goal to return back home.  This AM she overly sedated has recently received her oxycodone. She is not responsive to verbal stimuli. She will briefly open her eyes to tactile stimulation. Her respiration rate is 18. She will need her oxycodone dose reduced; as she is high risk for respiratory arrest.    Past Medical History:  Diagnosis Date  . Abnormal Pap smear   . Alcohol abuse   . Anemia    IDA  . Anxiety    Takes xanax  . Arthritis    "qwhere" (04/26/2016),   . Bipolar disorder ([place])   . Blood transfusion without reported diagnosis   . Bulging lumbar disc   . Chronic lower back pain   . Depression   . Fibromyalgia   . Headache    "weekly @ least" (04/26/2016)  . Hypertension   . Lactose intolerance in adult 2017  . Migraines    "weekly @ least" (04/26/2016)  . Seizures ([place])    last one Dec. 2017  . Type II diabetes mellitus ([place])    "before the gastric bypass" (04/26/2016)    Past Surgical History:  Procedure Laterality Date  . BALLOON DILATION N/A 05/27/2016   Procedure: BALLOON DILATION;  Surgeon: Doran Stabler, MD;  Location: [facility];  Service: Gastroenterology;  Laterality: N/A;  . CERVICAL CONE BIOPSY    . [place]; 1996; 1998; 2014  . CESAREAN SECTION WITH BILATERAL TUBAL LIGATION Bilateral 10/28/2012   Procedure: Repeat cesarean section with delivery of baby boy  at 60. Apgars 8/9.  BILATERAL TUBAL LIGATION;  Surgeon: Florian Buff, MD;  Location: [facility];  Service: Obstetrics;  Laterality: Bilateral;  . DILATION AND CURETTAGE OF UTERUS    . ESOPHAGOGASTRODUODENOSCOPY N/A 05/27/2016   Procedure: ESOPHAGOGASTRODUODENOSCOPY (EGD);  Surgeon: Doran Stabler, MD;  Location: [facility];  Service: Gastroenterology;  Laterality: N/A;  . ESOPHAGOGASTRODUODENOSCOPY (EGD) WITH PROPOFOL N/A 04/23/2016   Procedure: ESOPHAGOGASTRODUODENOSCOPY (EGD) WITH PROPOFOL;  Surgeon: Doran Stabler, MD;  Location: [facility];  Service: Endoscopy;  Laterality: N/A;  . GASTROSTOMY N/A 08/16/2016   Procedure: LAPAROSCOPIC INSERTION OF GASTROSTOMY TUBE IN REMNANT STOMACH;  Surgeon: Arta Bruce Kinsinger, MD;  Location: [facility];  Service: General;  Laterality: N/A;  . ROUX-EN-Y GASTRIC BYPASS  2007  . TUBAL LIGATION  2014    Social History   Social History  . Marital status: Widowed    Spouse name: N/A  . Number of children: N/A  . Years of education: N/A   Occupational History  . Not on file.   Social History Main Topics  . Smoking status: Former Smoker    Packs/day: 0.50    Years: 4.00    Types: Cigarettes    Quit date: 03/16/2012  . Smokeless tobacco: Never Used  . Alcohol use 10.2 oz/week    6  Glasses of wine, 11 Shots of liquor per week     Comment: current;y in treatment for alcohol abuse; no use at this time  . Drug use: No  . Sexual activity: Yes    Birth control/ protection: None   Other Topics Concern  . Not on file   Social History Narrative  . No narrative on file   Family History  Problem Relation Age of Onset  . Diabetes Father   . Heart disease Father   . Depression Maternal Grandmother   . Heart disease Maternal Grandfather   . Depression Paternal Grandmother   . Colon cancer Paternal Grandfather   . Pancreatic cancer Paternal Grandfather       VITAL SIGNS BP 118/66   Pulse 92   Temp 97.3 F (36.3 C)   Resp 18   Ht 5'  (1.524 m)   SpO2 95%     Patient's Medications  New Prescriptions   No medications on file  Previous Medications   ALPRAZOLAM (XANAX) 1 MG TABLET    Take 1 mg by mouth 3 (three) times daily.    CETIRIZINE (ZYRTEC) 10 MG TABLET    Take 10 mg by mouth daily as needed for allergies.   CYCLOBENZAPRINE (FLEXERIL) 5 MG TABLET    Take 1 tablet (5 mg total) by mouth 3 (three) times daily as needed for muscle spasms.   FAMOTIDINE (PEPCID) 40 MG/5ML SUSPENSION    Take 20 mg by mouth 2 (two) times daily.   LIDOCAINE (XYLOCAINE) 5 % OINTMENT    Apply 1 application topically 2 (two) times daily.    NUTRITIONAL SUPPLEMENTS (FEEDING SUPPLEMENT, JEVITY 1.5 CAL/FIBER,) LIQD    Place 1,000 mLs into feeding tube continuous. 39ml/h per G tube continuous. If well tolerated for next 4 days, can change to 26ml bolus 5 times a day.   ONDANSETRON (ZOFRAN) 4 MG TABLET    Take 4 mg by mouth every 6 (six) hours as needed for nausea or vomiting.   OXYCODONE (ROXICODONE) 5 MG/5ML SOLUTION    Place 15 mg into feeding tube every 4 (four) hours as needed for moderate pain.   OXYMETAZOLINE (AFRIN) 0.05 % NASAL SPRAY    Place 1 spray into both nostrils 2 (two) times daily as needed (dizziness).   PREGABALIN (LYRICA) 50 MG CAPSULE    Take 50 mg by mouth 3 (three) times daily.    QUETIAPINE (SEROQUEL) 100 MG TABLET    Take 1 tablet (100 mg total) by mouth at bedtime.  Modified Medications   No medications on file  Discontinued Medications     SIGNIFICANT DIAGNOSTIC EXAMS  07-21-16: upper endoscopy: 1. Normal esophagus. 2. 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 Biopsied. 3. Normal examined jejunum.   LABS REVIEWED:   08-12-16: wbc hgb a1c 4.9 08-16-16: wbc 7.3; hgb 8.6; hct 26.0; mcv 91.2; plt 339 08-17-16: wbc 5.6; hgb 8.4; hct 24.8; mcv 93.6; plt 293; glucose 91; bun 21; creat 0.53; k+ 3.8; na++ 138 08-19-16: wbc 4.2; hgb 7.7; hct 24.2; mcv 93.1; plt  287; glucose 139; bun 11; creat 0.46; k+ 4.0; na++ 136; mag 1.8; phos 3.5; liver normal albumin 3.1     Review of Systems  Unable to perform ROS: Other (obtunded )     Physical Exam  Constitutional: No distress.  Malnourished  Cachectic   Eyes: Conjunctivae are normal.  Neck: Neck supple. No JVD present. No thyromegaly present.  Cardiovascular: Normal rate, regular rhythm, normal heart sounds  and intact distal pulses.   Respiratory: Effort normal and breath sounds normal. No respiratory distress. She has no wheezes.  GI: Soft. Bowel sounds are normal. She exhibits no distension.  g-tube site without signs of infection present Abdominal binder in place   Musculoskeletal: She exhibits no edema.  Able to move all extremities   Lymphadenopathy:    She has no cervical adenopathy.  Neurological:  Obtunded   Skin: Skin is warm and dry. She is not diaphoretic.      ASSESSMENT/ PLAN:  1. Gastrojejunal anastomotic stricture with anastomotic ulcer:(had upper endoscopy 07-21-16)  will continue pepcid 20 mg twice daily   2. Chronic pain syndrome: with  history of chronic abdominal pain: has history of alcohol abuse:  will continue lyrica 50 mg three times daily; has flexeril 5 mg three times daily as needed    Due to her sedation will lower her oxycodone to 10 mg every 6 hours as needed with order to hold for sedation. Will have nursing to keep narcon on the medication cart with orders to give if resp rate equal to/or less then 8/minute.   3. Bipolar disorder: will continue seroquel 100 mg nightly and is taking xanax 1 mg three times daily for anxiety.   4. Severe protein calorie malnutrition: is status post g-tube placement in gastricremnant  : will use lidocaine topically twice daily to insertion site. Will continue jevity at 40 cc per hour of total of 4 days then change to 200 cc 5 times daily . Will begin MVI liquid daily   5. Iron deficiency anemia: hgb 7.7; will begin liquid iron  daily    Will check cbc and cmp   Time spent with patient  50  minutes >50% time spent counseling; reviewing medical record; tests; labs; and developing future plan of care    MD is aware of resident's narcotic use and is in agreement with current plan of care. We will attempt to wean resident as apropriate   Ok Edwards NP [facility]  Contact [phone] Monday through Friday 8am- 5pm  After hours call [phone] '

## 2016-08-25 ENCOUNTER — Encounter: Payer: Self-pay | Admitting: Internal Medicine

## 2016-08-25 ENCOUNTER — Non-Acute Institutional Stay (SKILLED_NURSING_FACILITY): Payer: Medicare Other | Admitting: Internal Medicine

## 2016-08-25 DIAGNOSIS — K9189 Other postprocedural complications and disorders of digestive system: Secondary | ICD-10-CM

## 2016-08-25 DIAGNOSIS — Z9189 Other specified personal risk factors, not elsewhere classified: Secondary | ICD-10-CM | POA: Insufficient documentation

## 2016-08-25 NOTE — Assessment & Plan Note (Signed)
As per Dr. Alvino Blood  Continue Jevity tube feedings

## 2016-08-25 NOTE — Assessment & Plan Note (Addendum)
Risk discussed; her response was projection unto psych meds with "[place] indifference" & denial as  to opiod risk Xanax will be decreased to 1 mg at bedtime as needed and Zyrtec changed to a nonsedating antihistamine I expressed need for all meds in her home to be under lock & key due to toddler in home

## 2016-08-25 NOTE — Patient Instructions (Addendum)
See assessment and plan under each diagnosis in the problem list and acutely for this visit Total time 51 minutes; greater than 50% of the visit spent counseling patient about opiod risks and coordinating care for problems addressed at this encounter Note: This addendum is actually correction of errors in the original Dragon created noted. That entry was inadvertantly signed off before proofreading and correction. Pascal Lux MD 08/26/16

## 2016-08-25 NOTE — Progress Notes (Addendum)
Patient ID: Angie [name], female   DOB: 1966-01-02, 51 y.o.   MRN: [id number]    This is a comprehensive admission note to [facility] performed on this date less than 30 days from date of admission. Included are preadmission medical/surgical history;reconciled medication list; family history; social history and comprehensive review of systems.  Corrections and additions to the records were documented . Comprehensive physical exam was also performed. Additionally a clinical summary was entered for each active diagnosis pertinent to this admission in the Problem List to enhance continuity of care.  PCP: Ricke Hey, M.D.  HPI: The patient was hospitalized 4/23-4/27/18 for G-tube placement into the gastric remnant 4/23 by Dr. Alvino Blood. Postoperatively the patient was admitted to the hospital. Due to her chronic narcotic use she had greater than average pain and pain medicine requirements as well as lower than expected ambulation. As of postop day 3 she had return of bowel function and was at goal for tube feedings. However, it was not felt that she could care for herself and was sent to SNF for PT/OT as well as continued tube feedings. She was discharged on bariatric full liquid diet orally as well as 40 mL an hour of Jevity 1.5 w fiber to the G-tube continuously. The G-tube was resting in the gastric remnant and not synchronous with her gastric pouch which is fed orally. 08/19/16 labs revealed glucose of 139, albumin 3.1, hemoglobin 7.7/hematocrit 24.2.  Glucoses while hospitalized ranged from 91- 167. Hemoglobin A1c was nondiabetic at 4.9% on 08/12/16. Liquid iron was initiated as the patient has been intolerant oral iron formulations.  On 08/23/16 the patient was seen acutely for profound sedation; patient was not responsive to verbal stimuli .The patient was noted to open her eyes to tactile stimulus but not to be interactive and communicative. Oxycodone dose was reduced  because of the high risk of respiratory arrest from opioid-induced suppression. Oxycodone was decreased to 10 mg every 6 hours as needed and was to be held if sedated. Narcan was ordered for use if respiratory rate were less than 8. Nursing &  CNA had difficulty arousing the patient. Through that day. The patient did not eat lunch because of somnolence.  Past medical and surgical history: The patient had gastric bypass in 2003 but has xperienced significant weight loss over the year prior to the this admission. 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 Medical diagnoses include type 2 diabetes, seizure disorder, migraines, hypertension, fibromyalgia, depression, chronic low back pain, bipolar disorder, anemia, and history of alcohol abuse. She states that she has PTSD, apparently diagnosed while in th [employer]. She was very vague as to who prescribed Seroquel and who diagnosed schizophrenia. Her PCP now describes the atypical psychotropic medication. The patient has had multiple gastric seizures and surgery.  Social history: The patient states she has successfully completed rehab for alcohol abuse. Apparently she had been in rehab several times. She previously drank @ least 6 glasses of wine and 11 shots of liquor per week. Use of illicit drugs was denied. The patient quit smoking 2013. She states she only smoked for a year. She states that she has a 69-year-old son at home.  Family history: Reviewed  Review of systems: Initially her only complaints were about staff. She stated that she was "mobilizing" better when she had the "right personnel" to help her. She states one CNA "didn't want to do her job and was mean and refused  to help". Only when I began to do an extensive review of systems did she have positive responses. She describes a nonproductive cough. She describes fever, chills, night sweats. She states that her pillowcase is soaked each am. She said  she is beginning to have small soft stools. She describes ongoing anxiety and depression. Although she is oriented, her insight is questionable. She believes that her excessive somnulence she's exhibited in the SNF is related to her psychiatric medications and wanted those but not the opiods adjusted.   Eyes: No redness, discharge, pain, vision change ENT/mouth: No nasal congestion,  purulent discharge, earache,change in hearing ,sore throat  Cardiovascular: No chest pain, palpitations,paroxysmal nocturnal dyspnea, claudication, edema  Respiratory: No sputum production,hemoptysis, DOE , significant snoring,apnea  Gastrointestinal: No heartburn,dysphagia, nausea / vomiting,rectal bleeding, melena Genitourinary: No dysuria,hematuria, pyuria,  incontinence, nocturia Musculoskeletal: No joint stiffness, joint swelling, weakness,pain Dermatologic: No rash, pruritus, change in appearance of skin Neurologic: No dizziness,syncope, seizures, numbness , tingling Endocrine: No change in hair/skin/ nails, excessive thirst, excessive hunger, excessive urination  Hematologic/lymphatic: No significant bruising, lymphadenopathy,abnormal bleeding Allergy/immunology: No itchy/ watery eyes, significant sneezing, urticaria, angioedema  Physical exam:  Pertinent or positive findings: The patient is lethargic and intermittently drops off to sleep dropping her pen and note pad during the interview. When she is awake she's very dramatic vocally, but for he most part her speech is slightly slurred. She appears cachectic with temporal wasting. She is edentulous. Grade 1 systolic murmur present @ base. Her abdomen is wrapped. Tube feeding is running. She has scattered tattoos. Clubbing or cyanosis cannot be evaluated as she has long false fingernails. She also has very elongated false eyelashes. Reflexes of the knees are 0-1/2+.  General appearance: no acute distress , increased work of breathing is present.   Lymphatic:  No lymphadenopathy about the head, neck, axilla . Eyes: No conjunctival inflammation or lid edema is present. There is no scleral icterus. Ears:  External ear exam shows no significant lesions or deformities.   Nose:  External nasal examination shows no deformity or inflammation. Nasal mucosa are pink and moist without lesions ,exudates Oral exam: lips and gums are healthy appearing.There is no oropharyngeal erythema or exudate . Neck:  No thyromegaly, masses, tenderness noted.    Heart:  Normal rate and regular rhythm. S1 and S2 normal without gallop, click, rub .  Lungs:Chest clear to auscultation without wheezes, rhonchi,rales , rubs. GU: deferred  Extremities:  No edema  Neurologic exam : Balance,Rhomberg,finger to nose testing could not be completed due to clinical state Deep tendon reflexes are equal Skin: Warm & dry w/o tenting. No significant lesions or rash.  See clinical summary under each active problem in the Problem List with associated updated therapeutic plan

## 2016-08-26 ENCOUNTER — Other Ambulatory Visit: Payer: Self-pay

## 2016-08-26 MED ORDER — ALPRAZOLAM 1 MG PO TABS: 1.0000 mg | ORAL_TABLET | Freq: Every evening | ORAL | 0 refills | Status: DC | PRN

## 2016-08-26 NOTE — Telephone Encounter (Signed)
RX faxed to [facility] fax # [phone] Phone #[phone] 

## 2016-08-27 ENCOUNTER — Other Ambulatory Visit: Payer: Self-pay | Admitting: *Deleted

## 2016-08-27 MED ORDER — ALPRAZOLAM 1 MG PO TABS: 1.0000 mg | ORAL_TABLET | Freq: Every evening | ORAL | 0 refills | Status: DC | PRN

## 2016-08-27 NOTE — Telephone Encounter (Signed)
[facility] #: [phone] Fax#: [phone]  

## 2016-09-06 ENCOUNTER — Non-Acute Institutional Stay (SKILLED_NURSING_FACILITY): Payer: Medicare Other | Admitting: Adult Health

## 2016-09-06 ENCOUNTER — Encounter: Payer: Self-pay | Admitting: Adult Health

## 2016-09-06 DIAGNOSIS — K9189 Other postprocedural complications and disorders of digestive system: Secondary | ICD-10-CM | POA: Diagnosis not present

## 2016-09-06 DIAGNOSIS — E43 Unspecified severe protein-calorie malnutrition: Secondary | ICD-10-CM

## 2016-09-06 DIAGNOSIS — Z9884 Bariatric surgery status: Secondary | ICD-10-CM

## 2016-09-06 DIAGNOSIS — G894 Chronic pain syndrome: Secondary | ICD-10-CM

## 2016-09-06 DIAGNOSIS — Z931 Gastrostomy status: Secondary | ICD-10-CM | POA: Insufficient documentation

## 2016-09-06 DIAGNOSIS — K912 Postsurgical malabsorption, not elsewhere classified: Secondary | ICD-10-CM

## 2016-09-06 NOTE — Progress Notes (Signed)
Location:   [facility] Room Number: 160 F UXNAT of Service:  SNF (31)    CODE STATUS: Full Code  Allergies  Allergen Reactions  . Iron     Had acute allergy with tachycardia, attention and generalized itching shortly after receiving nulecit ( iron gluconate) infusion.    Chief Complaint  Patient presents with  . Discharge Note    Discharge    HPI:  She is being [facility] to a [facility] shelter [facility] her choice. She will need home for pt/ot/rn. She will need a front wheel walker; 3:1 commode; shower bench and a reacher. She will need her prescriptions to be written and will need to follow up with her medical provider.  She had been hospitalized for malnutrition and deconditioning. She was admitted to this facility for short term rehab.    Past Medical History:  Diagnosis Date  . Abnormal Pap smear   . Alcohol abuse   . Anemia    IDA  . Anxiety    Takes xanax  . Arthritis    "qwhere" (04/26/2016),   . Bipolar disorder ([place])   . Blood transfusion without reported diagnosis   . Bulging lumbar disc   . Chronic lower back pain   . Depression   . Fibromyalgia   . Hypertension   . Lactose intolerance in adult 2017  . Migraines    "weekly @ least" (04/26/2016)  . Seizures ([place])    last one Dec. 2017  . Type II diabetes mellitus ([place])    "before the gastric bypass" (04/26/2016)    Past Surgical History:  Procedure Laterality Date  . BALLOON DILATION N/A 05/27/2016   Procedure: BALLOON DILATION;  Surgeon: Doran Stabler, MD;  Location: [facility];  Service: Gastroenterology;  Laterality: N/A;  . CERVICAL CONE BIOPSY    . [place]; 1996; 1998; 2014  . CESAREAN SECTION WITH BILATERAL TUBAL LIGATION Bilateral 10/28/2012   Procedure: Repeat cesarean section with delivery of baby boy at 59. Apgars 8/9.  BILATERAL TUBAL LIGATION;  Surgeon: Florian Buff, MD;  Location: [facility];  Service: Obstetrics;  Laterality: Bilateral;  . DILATION AND CURETTAGE OF  UTERUS    . ESOPHAGOGASTRODUODENOSCOPY N/A 05/27/2016   Procedure: ESOPHAGOGASTRODUODENOSCOPY (EGD);  Surgeon: Doran Stabler, MD;  Location: [facility];  Service: Gastroenterology;  Laterality: N/A;  . ESOPHAGOGASTRODUODENOSCOPY (EGD) WITH PROPOFOL N/A 04/23/2016   Procedure: ESOPHAGOGASTRODUODENOSCOPY (EGD) WITH PROPOFOL;  Surgeon: Doran Stabler, MD;  Location: [facility];  Service: Endoscopy;  Laterality: N/A;  . GASTROSTOMY N/A 08/16/2016   Procedure: LAPAROSCOPIC INSERTION OF GASTROSTOMY TUBE IN REMNANT STOMACH;  Surgeon: Arta Bruce Kinsinger, MD;  Location: [facility];  Service: General;  Laterality: N/A;  . ROUX-EN-Y GASTRIC BYPASS  2007  . TUBAL LIGATION  2014    Social History   Social History  . Marital status: Widowed    Spouse name: N/A  . Number of children: N/A  . Years of education: N/A   Occupational History  . Not on file.   Social History Main Topics  . Smoking status: Former Smoker    Packs/day: 0.50    Years: 4.00    Types: Cigarettes    Quit date: 03/16/2012  . Smokeless tobacco: Never Used  . Alcohol use 10.2 oz/week    6 Glasses of wine, 11 Shots of liquor per week     Comment: current;y in treatment for alcohol abuse; no use at this time  . Drug  use: No  . Sexual activity: Yes    Birth control/ protection: None   Other Topics Concern  . Not on file   Social History Narrative  . No narrative on file   Family History  Problem Relation Age of Onset  . Diabetes Father   . Heart disease Father   . Depression Maternal Grandmother   . Heart disease Maternal Grandfather   . Depression Paternal Grandmother   . Colon cancer Paternal Grandfather   . Pancreatic cancer Paternal Grandfather     VITAL SIGNS BP 110/78   Pulse 76   Temp 98.1 F (36.7 C)   Resp 18   Ht 5' (1.524 m)   Wt 111 lb 3.2 oz (50.4 kg)   SpO2 92%   BMI 21.72 kg/m   Patient's Medications  New Prescriptions   No medications on file  Previous Medications   ALPRAZOLAM  (XANAX) 1 MG TABLET    Take 1 tablet (1 mg total) by mouth at bedtime as needed for anxiety.   CYCLOBENZAPRINE (FLEXERIL) 5 MG TABLET    Take 1 tablet (5 mg total) by mouth 3 (three) times daily as needed for muscle spasms.   FAMOTIDINE (PEPCID) 40 MG/5ML SUSPENSION    Take 20 mg by mouth 2 (two) times daily.   FEXOFENADINE (ALLEGRA) 180 MG TABLET    Take 180 mg by mouth every morning.   LIDOCAINE (XYLOCAINE) 5 % OINTMENT    Apply 1 application topically 2 (two) times daily. Apply to Bilateral shoulders   MULTIPLE VITAMINS-MINERALS (MULTIVITAMIN WITH MINERALS) TABLET    Place 1 tablet into feeding tube daily.   NALOXONE HCL 0.4 MG/0.4ML SOAJ    Inject 1 Dose as directed as needed. Inject I dose subcutaneously as needed for Over Sedation Respirations less than 8/min   NUTRITIONAL SUPPLEMENTS (FEEDING SUPPLEMENT, JEVITY 1.5 CAL,) LIQD    Give 200cc bolus feed 5 times daily   ONDANSETRON (ZOFRAN) 4 MG TABLET    Take 4 mg by mouth every 6 (six) hours as needed for nausea or vomiting.   OXYCODONE (ROXICODONE) 5 MG/5ML SOLUTION    Place 10 mg into feeding tube every 6 (six) hours as needed for moderate pain.    OXYMETAZOLINE (AFRIN) 0.05 % NASAL SPRAY    Place 1 spray into both nostrils 2 (two) times daily as needed (dizziness).   PREGABALIN (LYRICA) 50 MG CAPSULE    Take 50 mg by mouth 3 (three) times daily.   QUETIAPINE (SEROQUEL) 100 MG TABLET    Take 1 tablet (100 mg total) by mouth at bedtime.  Modified Medications   No medications on file  Discontinued Medications   CETIRIZINE (ZYRTEC) 10 MG TABLET    Take 10 mg by mouth daily as needed for allergies.   NUTRITIONAL SUPPLEMENTS (FEEDING SUPPLEMENT, JEVITY 1.5 CAL/FIBER,) LIQD    Place 1,000 mLs into feeding tube continuous. 70ml/h per G tube continuous. If well tolerated for next 4 days, can change to 252ml bolus 5 times a day.   PREGABALIN (LYRICA) 50 MG CAPSULE    Take 50 mg by mouth 3 (three) times daily.      SIGNIFICANT DIAGNOSTIC  EXAMS  07-21-16: upper endoscopy: 1. Normal esophagus. 2. 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 Biopsied. 3. Normal examined jejunum.   LABS REVIEWED:   08-12-16: wbc hgb a1c 4.9 08-16-16: wbc 7.3; hgb 8.6; hct 26.0; mcv 91.2; plt 339 08-17-16: wbc 5.6; hgb 8.4; hct 24.8; mcv 93.6; plt  293; glucose 91; bun 21; creat 0.53; k+ 3.8; na++ 138 08-19-16: wbc 4.2; hgb 7.7; hct 24.2; mcv 93.1; plt 287; glucose 139; bun 11; creat 0.46; k+ 4.0; na++ 136; mag 1.8; phos 3.5; liver normal albumin 3.1   Review of Systems  Constitutional: Negative for malaise/fatigue.  Respiratory: Negative for cough and shortness of breath.   Cardiovascular: Negative for chest pain, palpitations and leg swelling.  Gastrointestinal: Negative for abdominal pain, constipation and heartburn.       Has peg tube   Musculoskeletal: Positive for myalgias. Negative for back pain and joint pain.       Has pain at her peg tube site   Skin: Negative.   Neurological: Negative for dizziness.  Psychiatric/Behavioral: The patient is not nervous/anxious.      Physical Exam  Constitutional: No distress.  Malnourished  Cachectic   Eyes: Conjunctivae are normal.  Neck: Neck supple. No JVD present. No thyromegaly present.  Cardiovascular: Normal rate, regular rhythm, normal heart sounds and intact distal pulses.   Respiratory: Effort normal and breath sounds normal. No respiratory distress. She has no wheezes.  GI: Soft. Bowel sounds are normal. She exhibits no distension.  g-tube site without signs of infection present Musculoskeletal: She exhibits no edema.  Able to move all extremities   Lymphadenopathy:    She has no cervical adenopathy.  Neurological: alert and oriented X 3 Skin: Skin is warm and dry. She is not diaphoretic.      ASSESSMENT/ PLAN:  Patient is being discharged with the following home health services:  Pt/ot/rn: to evaluate and treat  as indicated for gait balance strength adl training; medication management and peg tube care.   Patient is being discharged with the following durable medical equipment:  3:1 commode; reacher; front wheel walker in order for her maintain her current level of independence with her adl's   Patient has been advised to f/u with their PCP in 1-2 weeks to bring them up to date on their rehab stay.  Social services at facility was responsible for arranging this appointment.  Pt was provided with a 30 day supply of prescriptions for medications and refills must be obtained from their PCP.  For controlled substances, a more limited supply may be provided adequate until PCP appointment only. #90; lyrica 75 mg caps #5 xanax 1 mg tabs  her oxycodone liquid had been changed to pill form per her request: #20 oxycodone 10 mg tabs.     Time spent with patient  45   minutes >50% time spent counseling; reviewing medical record; tests; labs; and developing future plan of care     Ok Edwards NP [facility]  Contact [phone] Monday through Friday 8am- 5pm  After hours call [phone]

## 2016-09-24 NOTE — Addendum Note (Signed)
Addendum  created 09/24/16 1243 by Effie Berkshire, MD   Sign clinical note

## 2016-10-20 ENCOUNTER — Telehealth: Payer: Self-pay | Admitting: Gastroenterology

## 2016-10-20 NOTE — Telephone Encounter (Signed)
Spoke to patient, I explained that I cannot change the Jevity order. She needs to contact [facility] to get a corrected order as that is who delivered to her. Patient will contact them, hopefully to get this corrected, as patient has a severe iron allergy.

## 2016-10-20 NOTE — Telephone Encounter (Signed)
Called patient, she was upset about the Jevity order. I let her know that did not come from out office and to contact Ok Edwards, NP as she is the one who ordered this supplement.

## 2016-10-20 NOTE — Telephone Encounter (Signed)
Patient is requesting to speak to a nurse regarding this. [facility] # [phone].

## 2016-11-01 ENCOUNTER — Other Ambulatory Visit: Payer: Self-pay | Admitting: Adult Health

## 2016-11-19 ENCOUNTER — Telehealth: Payer: Self-pay

## 2016-11-19 ENCOUNTER — Telehealth: Payer: Self-pay | Admitting: Gastroenterology

## 2016-11-19 NOTE — Telephone Encounter (Signed)
That is not a medicine I prescribed her.  She should call her PCP

## 2016-11-19 NOTE — Telephone Encounter (Signed)
Pt has been notified and aware to contact her primary doctor.

## 2016-11-19 NOTE — Telephone Encounter (Signed)
Incoming fax from [facility]. Refill request for Seroquel 100 mg once a day. I don't see where we have given this Rx to the patient before. Looks like it was filled last by Dr Roxan Hockey. Please advise.

## 2016-11-22 NOTE — Telephone Encounter (Signed)
Left message for patient to call back  

## 2016-11-25 NOTE — Telephone Encounter (Signed)
Patient did not call back, she just showed up at 4:00. Explained that unfortunately we will have to reschedule her appointment to 12/29/16. Spoke with Dr. [name] about this, he will be glad to see her then.

## 2016-11-25 NOTE — Telephone Encounter (Signed)
Called patient back again since I had not heard from her, she is wanting to know when she can schedule her procedure to "cut her esophagus open", she has been gaining more weight. I let her know that she should come in to discuss this in the office, there is availability today at 1:30 or 4:00 after that she will have to wait until 9/5. She is going to check with her ride and call me back.

## 2016-12-29 ENCOUNTER — Encounter: Payer: Self-pay | Admitting: Gastroenterology

## 2016-12-29 ENCOUNTER — Other Ambulatory Visit (INDEPENDENT_AMBULATORY_CARE_PROVIDER_SITE_OTHER): Payer: Medicare Other

## 2016-12-29 ENCOUNTER — Other Ambulatory Visit: Payer: Self-pay

## 2016-12-29 ENCOUNTER — Ambulatory Visit (INDEPENDENT_AMBULATORY_CARE_PROVIDER_SITE_OTHER): Payer: Medicare Other | Admitting: Gastroenterology

## 2016-12-29 ENCOUNTER — Ambulatory Visit: Payer: Medicare Other | Admitting: Gastroenterology

## 2016-12-29 VITALS — BP 134/88 | HR 74 | Ht 60.0 in | Wt 128.0 lb

## 2016-12-29 DIAGNOSIS — G8929 Other chronic pain: Secondary | ICD-10-CM

## 2016-12-29 DIAGNOSIS — K289 Gastrojejunal ulcer, unspecified as acute or chronic, without hemorrhage or perforation: Secondary | ICD-10-CM

## 2016-12-29 DIAGNOSIS — D508 Other iron deficiency anemias: Secondary | ICD-10-CM

## 2016-12-29 DIAGNOSIS — Z931 Gastrostomy status: Secondary | ICD-10-CM

## 2016-12-29 DIAGNOSIS — K912 Postsurgical malabsorption, not elsewhere classified: Secondary | ICD-10-CM

## 2016-12-29 DIAGNOSIS — R1013 Epigastric pain: Secondary | ICD-10-CM

## 2016-12-29 DIAGNOSIS — R1310 Dysphagia, unspecified: Secondary | ICD-10-CM

## 2016-12-29 DIAGNOSIS — Z9884 Bariatric surgery status: Secondary | ICD-10-CM | POA: Diagnosis not present

## 2016-12-29 DIAGNOSIS — R1319 Other dysphagia: Secondary | ICD-10-CM

## 2016-12-29 LAB — FERRITIN: Ferritin: 6.1 ng/mL — ABNORMAL LOW (ref 10.0–291.0)

## 2016-12-29 LAB — FOLATE: Folate: 9.1 ng/mL (ref >5.9)

## 2016-12-29 LAB — VITAMIN B12: Vitamin B-12: 268 pg/mL (ref 211–911)

## 2016-12-29 LAB — ALBUMIN: Albumin: 3.7 g/dL (ref 3.5–5.2)

## 2016-12-29 NOTE — Patient Instructions (Signed)
If you are age 50 or older, your body mass index should be between 23-30. Your Body mass index is 25 kg/m. If this is out of the aforementioned range listed, please consider follow up with your Primary Care Provider.  If you are age 74 or younger, your body mass index should be between 19-25. Your Body mass index is 25 kg/m. If this is out of the aformentioned range listed, please consider follow up with your Primary Care Provider.   You have been scheduled for an endoscopy. Please follow written instructions given to you at your visit today. If you use inhalers (even only as needed), please bring them with you on the day of your procedure. Your physician has requested that you go to www.startemmi.com and enter the access code given to you at your visit today. This web site gives a general overview about your procedure. However, you should still follow specific instructions given to you by our office regarding your preparation for the procedure.  Thank you for choosing [facility]  Dr Wilfrid Lund III

## 2016-12-29 NOTE — Progress Notes (Signed)
[facility] Progress Note  Chief Complaint: Dysphagia and malnutrition  Subjective  History:   This is a 51 year old woman I last saw in March for an upper endoscopy. 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 It had not responded very well to balloon dilation. Unfortunately, she continued to struggle with alcohol abuse, mental illness and continued NSAID use. I referred her to Gen. surgery, and Dr. Verne Carrow a laparoscopic gastrostomy tube in the gastric remnant. She has been receiving supplemental liquid nutrition through that tube. She initially had great difficulty with persistent leakage out of the site and adjustments required because of discomfort. I'm glad to say that she is in much better health than the last time I saw her. Her weight is up from 111 pounds in May 2 128 pounds now. She went through alcohol rehabilitation and has been abstinent for several months. She feels that her mental health issues are stable, especially since discontinuing alcohol. She no longer uses ibuprofen or any NSAIDs. However, she continues to struggle with dysphagia to both solids and liquids. Anything more than a pure consistency will get hung up in the lower chest and often come back up on her.  ROS: Cardiovascular:  no chest pain Respiratory: no dyspnea Chronic anxiety that she recently feels is stable.  Chronic pain syndrome  Remainder systems are negative except as above in history The patient's Past Medical, Family and Social History were reviewed and are on file in the EMR.  Objective:  Med list reviewed  Current Outpatient Prescriptions:  .  ALPRAZolam (XANAX) 1 MG tablet, Take 1 tablet (1 mg total) by mouth at bedtime as needed for anxiety., Disp: 30 tablet, Rfl: 0 .  cyclobenzaprine (FLEXERIL) 5 MG tablet, Take 1 tablet (5 mg total) by mouth 3 (three) times daily as needed for muscle spasms., Disp: 30  tablet, Rfl: 0 .  famotidine (PEPCID) 40 MG/5ML suspension, Take 20 mg by mouth 2 (two) times daily., Disp: , Rfl:  .  fexofenadine (ALLEGRA) 180 MG tablet, Take 180 mg by mouth every morning., Disp: , Rfl:  .  lidocaine (XYLOCAINE) 5 % ointment, Apply 1 application topically 2 (two) times daily. Apply to Bilateral shoulders, Disp: , Rfl:  .  Naloxone HCl 0.4 MG/0.4ML SOAJ, Inject 1 Dose as directed as needed. Inject I dose subcutaneously as needed for Over Sedation Respirations less than 8/min, Disp: , Rfl:  .  ondansetron (ZOFRAN) 4 MG tablet, Take 4 mg by mouth every 6 (six) hours as needed for nausea or vomiting., Disp: , Rfl:  .  oxyCODONE-acetaminophen (PERCOCET) 10-325 MG tablet, Take 1 tablet by mouth 4 (four) times daily., Disp: , Rfl:  .  oxymetazoline (AFRIN) 0.05 % nasal spray, Place 1 spray into both nostrils 2 (two) times daily as needed (dizziness)., Disp: , Rfl:  .  pregabalin (LYRICA) 50 MG capsule, Take 50 mg by mouth 3 (three) times daily., Disp: , Rfl:  .  QUEtiapine (SEROQUEL) 100 MG tablet, Take 1 tablet (100 mg total) by mouth at bedtime., Disp: 30 tablet, Rfl: 2   Vital signs in last 24 hrs: Vitals:   12/29/16 1326  BP: 134/88  Pulse: 74    Physical Exam  She has improved muscle mass since our last visit.  HEENT: sclera anicteric, oral mucosa moist without lesions  Neck: supple, no thyromegaly, JVD or lymphadenopathy  Cardiac: RRR without murmurs, S1S2 heard, no peripheral edema  Pulm: clear to auscultation bilaterally, normal RR and effort noted  Abdomen: soft, no tenderness, with active bowel sounds. No guarding or palpable hepatosplenomegaly.  A balloon type gastrostomy tube is in the epigastrium, and the site appears healthy. There is a small amount of tissue protruding from the gastrostomy tract, not inflamed and easily reduced. The surrounding skin is healthy.  Skin; warm and dry, no jaundice or rash  Recent Labs:  Last hemoglobin was 7.7 in  April  I reviewed the surgical report by Dr. Kieth Brightly   @ASSESSMENTPLANBEGIN @ Assessment: Encounter Diagnoses  Name Primary?  . Esophageal dysphagia Yes  . Anastomotic ulcer   . Abdominal pain, chronic, epigastric   . S/P gastric bypass   . S/P percutaneous endoscopic gastrostomy (PEG) tube placement ([place])   . Iron deficiency anemia secondary to inadequate dietary iron intake   . 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 An ulcerated stricture at the gastric bypass that was likely multifactorial from  anastomotic ischemia, alcohol and NSAID use. Her nutritional status is much improved. She is anxious to move forward with better health and to have "the throat surgery done". She seems to be under the impression that with improved nutrition, I might be able to perform a more definitive endoscopic solution to this anastomotic stricture. I explained to her my feelings on this matter. I am willing to do an upper endoscopy and see if the balloon dilation is even feasible. It might not be due to the location and nature of the stricture as well as a current gastrostomy tube in place. Even if the dilation is feasible, it is not likely to be any longer lasting than it was before. I felt before and still feel now that she is very likely to need definitive surgical revision of this anastomosis. She was certainly not ready for that last spring when she was severely protein calorie malnourished, but is in a better place for that now.  The status of her anemia, B12 and iron levels is also currently unknown.  Plan:  Labs today: CBC, U98, folic acid, ferritin, albumin, prealbumin Upper endoscopy in the hospital and the lab next week, possible balloon dilation if feasible. She then needs reevaluation with Dr. Kieth Brightly to discuss surgical revision if  there is still a significant stricture present.  Total time 40 minutes, over half spent in counseling and coordination of care.   Nelida Meuse III CC: Gurney Maxin, MD [facility])

## 2016-12-30 LAB — PREALBUMIN: PREALBUMIN: 14 mg/dL (ref 12–34)

## 2016-12-31 ENCOUNTER — Telehealth: Payer: Self-pay

## 2016-12-31 NOTE — Progress Notes (Signed)
  Called patient x 2 on 12-26-16 and x 1 on 12-31-16 no answer

## 2016-12-31 NOTE — Telephone Encounter (Signed)
[facility] endo pre-surgical services called to let us know that they have been unable to reach patient by phone to confirm procedure. Let they know that she was seen in office on 12/29/16 and given prep instructions and to arrive at 6:00 am. I tried to call patient, but her phone was not accepting calls.

## 2017-01-03 ENCOUNTER — Encounter (HOSPITAL_COMMUNITY)
Admission: RE | Disposition: A | Discharge status: Home / Self Care | Payer: Self-pay | Source: Ambulatory Visit | Attending: Gastroenterology

## 2017-01-03 ENCOUNTER — Ambulatory Visit (HOSPITAL_COMMUNITY): Payer: Medicare Other | Admitting: Certified Registered Nurse Anesthetist

## 2017-01-03 ENCOUNTER — Encounter (HOSPITAL_COMMUNITY): Payer: Self-pay

## 2017-01-03 ENCOUNTER — Ambulatory Visit (HOSPITAL_COMMUNITY)
Admission: RE | Admit: 2017-01-03 | Discharge: 2017-01-03 | Disposition: A | Discharge status: Home / Self Care | Payer: Medicare Other | Source: Ambulatory Visit | Attending: Gastroenterology | Admitting: Gastroenterology

## 2017-01-03 DIAGNOSIS — Z79899 Other long term (current) drug therapy: Secondary | ICD-10-CM | POA: Insufficient documentation

## 2017-01-03 DIAGNOSIS — Z79891 Long term (current) use of opiate analgesic: Secondary | ICD-10-CM | POA: Insufficient documentation

## 2017-01-03 DIAGNOSIS — D649 Anemia, unspecified: Secondary | ICD-10-CM | POA: Diagnosis not present

## 2017-01-03 DIAGNOSIS — R1319 Other dysphagia: Secondary | ICD-10-CM

## 2017-01-03 DIAGNOSIS — R1310 Dysphagia, unspecified: Secondary | ICD-10-CM | POA: Diagnosis present

## 2017-01-03 DIAGNOSIS — K9189 Other postprocedural complications and disorders of digestive system: Secondary | ICD-10-CM | POA: Diagnosis not present

## 2017-01-03 DIAGNOSIS — K9589 Other complications of other bariatric procedure: Secondary | ICD-10-CM | POA: Insufficient documentation

## 2017-01-03 DIAGNOSIS — K912 Postsurgical malabsorption, not elsewhere classified: Secondary | ICD-10-CM

## 2017-01-03 DIAGNOSIS — Z931 Gastrostomy status: Secondary | ICD-10-CM

## 2017-01-03 DIAGNOSIS — F419 Anxiety disorder, unspecified: Secondary | ICD-10-CM | POA: Insufficient documentation

## 2017-01-03 DIAGNOSIS — Z9884 Bariatric surgery status: Secondary | ICD-10-CM

## 2017-01-03 DIAGNOSIS — E46 Unspecified protein-calorie malnutrition: Secondary | ICD-10-CM | POA: Diagnosis not present

## 2017-01-03 DIAGNOSIS — K289 Gastrojejunal ulcer, unspecified as acute or chronic, without hemorrhage or perforation: Secondary | ICD-10-CM

## 2017-01-03 DIAGNOSIS — D508 Other iron deficiency anemias: Secondary | ICD-10-CM

## 2017-01-03 DIAGNOSIS — G8929 Other chronic pain: Secondary | ICD-10-CM

## 2017-01-03 DIAGNOSIS — R1013 Epigastric pain: Secondary | ICD-10-CM

## 2017-01-03 HISTORY — PX: ESOPHAGOGASTRODUODENOSCOPY (EGD) WITH PROPOFOL: SHX5813

## 2017-01-03 SURGERY — ESOPHAGOGASTRODUODENOSCOPY (EGD) WITH PROPOFOL
Anesthesia: Monitor Anesthesia Care

## 2017-01-03 MED ORDER — PROPOFOL 10 MG/ML IV BOLUS
INTRAVENOUS | Status: DC | PRN
  Administered 2017-01-03 (×4): 20 mg via INTRAVENOUS

## 2017-01-03 MED ORDER — LIDOCAINE 2% (20 MG/ML) 5 ML SYRINGE
INTRAMUSCULAR | Status: DC | PRN
Start: 2017-01-03 — End: 2017-01-03
  Administered 2017-01-03: 100 mg via INTRAVENOUS

## 2017-01-03 MED ORDER — PROPOFOL 500 MG/50ML IV EMUL
INTRAVENOUS | Status: DC | PRN
  Administered 2017-01-03: 140 ug/kg/min via INTRAVENOUS

## 2017-01-03 MED ORDER — PROPOFOL 10 MG/ML IV BOLUS
INTRAVENOUS | Status: AC
  Filled 2017-01-03: qty 40

## 2017-01-03 MED ORDER — LACTATED RINGERS IV SOLN
INTRAVENOUS | Status: DC
  Administered 2017-01-03: 1000 mL via INTRAVENOUS

## 2017-01-03 MED ORDER — SODIUM CHLORIDE 0.9 % IV SOLN: INTRAVENOUS | Status: DC

## 2017-01-03 SURGICAL SUPPLY — 15 items
BLOCK BITE 60FR ADLT L/F BLUE (MISCELLANEOUS) ×3 IMPLANT
ELECT REM PT RETURN 9FT ADLT (ELECTROSURGICAL) IMPLANT
ELECTRODE REM PT RTRN 9FT ADLT (ELECTROSURGICAL) IMPLANT
FORCEP RJ3 GP 1.8X160 W-NEEDLE (CUTTING FORCEPS) IMPLANT
FORCEPS BIOP RAD 4 LRG CAP 4 (CUTTING FORCEPS) IMPLANT
NDL SCLEROTHERAPY 25GX240 (NEEDLE) IMPLANT
NEEDLE SCLEROTHERAPY 25GX240 (NEEDLE) IMPLANT
PROBE APC STR FIRE (PROBE) IMPLANT
PROBE INJECTION GOLD (MISCELLANEOUS)
PROBE INJECTION GOLD 7FR (MISCELLANEOUS) IMPLANT
SNARE SHORT THROW 13M SML OVAL (MISCELLANEOUS) IMPLANT
SYR 50ML LL SCALE MARK (SYRINGE) IMPLANT
TUBING ENDO SMARTCAP PENTAX (MISCELLANEOUS) ×6 IMPLANT
TUBING IRRIGATION ENDOGATOR (MISCELLANEOUS) ×3 IMPLANT
WATER STERILE IRR 1000ML POUR (IV SOLUTION) IMPLANT

## 2017-01-03 NOTE — Anesthesia Postprocedure Evaluation (Signed)
Anesthesia Post Note  Patient: Angie [name]  Procedure(s) Performed: Procedure(s) (LRB): ESOPHAGOGASTRODUODENOSCOPY (EGD) WITH PROPOFOL (N/A)     Patient location during evaluation: PACU Anesthesia Type: MAC Level of consciousness: awake and alert Pain management: pain level controlled Vital Signs Assessment: post-procedure vital signs reviewed and stable Respiratory status: spontaneous breathing, nonlabored ventilation, respiratory function stable and patient connected to nasal cannula oxygen Cardiovascular status: stable and blood pressure returned to baseline Anesthetic complications: no    Last Vitals:  Vitals:   01/03/17 0820 01/03/17 0830  BP: 135/86 (!) 147/82  Pulse: 75 68  Resp: 17 16  Temp:    SpO2: 98% 97%    Last Pain:  Vitals:   01/03/17 0805  TempSrc: Oral                 Ryan P Ellender

## 2017-01-03 NOTE — H&P (View-Only) (Signed)
[facility] Progress Note  Chief Complaint: Dysphagia and malnutrition  Subjective  History:   This is a 51 year old woman I last saw in March for an upper endoscopy. 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 It had not responded very well to balloon dilation. Unfortunately, she continued to struggle with alcohol abuse, mental illness and continued NSAID use. I referred her to Gen. surgery, and Dr. Verne Carrow a laparoscopic gastrostomy tube in the gastric remnant. She has been receiving supplemental liquid nutrition through that tube. She initially had great difficulty with persistent leakage out of the site and adjustments required because of discomfort. I'm glad to say that she is in much better health than the last time I saw her. Her weight is up from 111 pounds in May 2 128 pounds now. She went through alcohol rehabilitation and has been abstinent for several months. She feels that her mental health issues are stable, especially since discontinuing alcohol. She no longer uses ibuprofen or any NSAIDs. However, she continues to struggle with dysphagia to both solids and liquids. Anything more than a pure consistency will get hung up in the lower chest and often come back up on her.  ROS: Cardiovascular:  no chest pain Respiratory: no dyspnea Chronic anxiety that she recently feels is stable.  Chronic pain syndrome  Remainder systems are negative except as above in history The patient's Past Medical, Family and Social History were reviewed and are on file in the EMR.  Objective:  Med list reviewed  Current Outpatient Prescriptions:  .  ALPRAZolam (XANAX) 1 MG tablet, Take 1 tablet (1 mg total) by mouth at bedtime as needed for anxiety., Disp: 30 tablet, Rfl: 0 .  cyclobenzaprine (FLEXERIL) 5 MG tablet, Take 1 tablet (5 mg total) by mouth 3 (three) times daily as needed for muscle spasms., Disp: 30  tablet, Rfl: 0 .  famotidine (PEPCID) 40 MG/5ML suspension, Take 20 mg by mouth 2 (two) times daily., Disp: , Rfl:  .  fexofenadine (ALLEGRA) 180 MG tablet, Take 180 mg by mouth every morning., Disp: , Rfl:  .  lidocaine (XYLOCAINE) 5 % ointment, Apply 1 application topically 2 (two) times daily. Apply to Bilateral shoulders, Disp: , Rfl:  .  Naloxone HCl 0.4 MG/0.4ML SOAJ, Inject 1 Dose as directed as needed. Inject I dose subcutaneously as needed for Over Sedation Respirations less than 8/min, Disp: , Rfl:  .  ondansetron (ZOFRAN) 4 MG tablet, Take 4 mg by mouth every 6 (six) hours as needed for nausea or vomiting., Disp: , Rfl:  .  oxyCODONE-acetaminophen (PERCOCET) 10-325 MG tablet, Take 1 tablet by mouth 4 (four) times daily., Disp: , Rfl:  .  oxymetazoline (AFRIN) 0.05 % nasal spray, Place 1 spray into both nostrils 2 (two) times daily as needed (dizziness)., Disp: , Rfl:  .  pregabalin (LYRICA) 50 MG capsule, Take 50 mg by mouth 3 (three) times daily., Disp: , Rfl:  .  QUEtiapine (SEROQUEL) 100 MG tablet, Take 1 tablet (100 mg total) by mouth at bedtime., Disp: 30 tablet, Rfl: 2   Vital signs in last 24 hrs: Vitals:   12/29/16 1326  BP: 134/88  Pulse: 74    Physical Exam  She has improved muscle mass since our last visit.  HEENT: sclera anicteric, oral mucosa moist without lesions  Neck: supple, no thyromegaly, JVD or lymphadenopathy  Cardiac: RRR without murmurs, S1S2 heard, no peripheral edema  Pulm: clear to auscultation bilaterally, normal RR and effort noted  Abdomen: soft, no tenderness, with active bowel sounds. No guarding or palpable hepatosplenomegaly.  A balloon type gastrostomy tube is in the epigastrium, and the site appears healthy. There is a small amount of tissue protruding from the gastrostomy tract, not inflamed and easily reduced. The surrounding skin is healthy.  Skin; warm and dry, no jaundice or rash  Recent Labs:  Last hemoglobin was 7.7 in  April  I reviewed the surgical report by Dr. Kieth Brightly   @ASSESSMENTPLANBEGIN @ Assessment: Encounter Diagnoses  Name Primary?  . Esophageal dysphagia Yes  . Anastomotic ulcer   . Abdominal pain, chronic, epigastric   . S/P gastric bypass   . S/P percutaneous endoscopic gastrostomy (PEG) tube placement ([place])   . Iron deficiency anemia secondary to inadequate dietary iron intake   . 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 An ulcerated stricture at the gastric bypass that was likely multifactorial from  anastomotic ischemia, alcohol and NSAID use. Her nutritional status is much improved. She is anxious to move forward with better health and to have "the throat surgery done". She seems to be under the impression that with improved nutrition, I might be able to perform a more definitive endoscopic solution to this anastomotic stricture. I explained to her my feelings on this matter. I am willing to do an upper endoscopy and see if the balloon dilation is even feasible. It might not be due to the location and nature of the stricture as well as a current gastrostomy tube in place. Even if the dilation is feasible, it is not likely to be any longer lasting than it was before. I felt before and still feel now that she is very likely to need definitive surgical revision of this anastomosis. She was certainly not ready for that last spring when she was severely protein calorie malnourished, but is in a better place for that now.  The status of her anemia, B12 and iron levels is also currently unknown.  Plan:  Labs today: CBC, O97, folic acid, ferritin, albumin, prealbumin Upper endoscopy in the hospital and the lab next week, possible balloon dilation if feasible. She then needs reevaluation with Dr. Kieth Brightly to discuss surgical revision if  there is still a significant stricture present.  Total time 40 minutes, over half spent in counseling and coordination of care.   Nelida Meuse III CC: Gurney Maxin, MD [facility])

## 2017-01-03 NOTE — Op Note (Signed)
[facility] Patient Name: Angie [name] Procedure Date: 01/03/2017 MRN: [id number] Attending MD: Estill Cotta. [name] , MD Date of Birth: 01/22/1966 CSN: [id number] Age: 51 Admit Type: Outpatient Procedure:                Upper GI endoscopy Indications:              Dysphagia, Post-bariatric anastomotic stenosis Providers:                Mallie Mussel L. [name], MD, Angus Seller, William Dalton, Technician Referring MD:              Medicines:                Monitored Anesthesia Care Complications:            No immediate complications. Estimated Blood Loss:     Estimated blood loss: none. Procedure:                Pre-Anesthesia Assessment:                           - Prior to the procedure, a History and Physical                            was performed, and patient medications and                            allergies were reviewed. The patient's tolerance of                            previous anesthesia was also reviewed. The risks                            and benefits of the procedure and the sedation                            options and risks were discussed with the patient.                            All questions were answered, and informed consent                            was obtained. Prior Anticoagulants: The patient has                            taken no previous anticoagulant or antiplatelet                            agents. ASA Grade Assessment: III - A patient with                            severe systemic disease. After reviewing the risks  and benefits, the patient was deemed in                            satisfactory condition to undergo the procedure.                           After obtaining informed consent, the endoscope was                            passed under direct vision. Throughout the                            procedure, the patient's blood pressure, pulse, and                             oxygen saturations were monitored continuously. The                            Endoscope was introduced through the mouth, and                            advanced to the gastric cardia. The upper GI                            endoscopy was accomplished without difficulty. The                            patient tolerated the procedure well. Scope In: Scope Out: Findings:      The esophagus was normal.      Evidence of a gastric bypass was found. A gastric pouch with a normal       size was found. 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 This       could not be traversed, nor could dilation be performed. Impression:               - Normal esophagus.                           - Gastric bypass with a normal-sized pouch and                            intact staple line. 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                           - No specimens collected. Moderate Sedation:      MAC sedation used Recommendation:           - Patient has a contact number available for                            emergencies. The signs and symptoms of potential  delayed complications were discussed with the                            patient. Return to normal activities tomorrow.                            Written discharge instructions were provided to the                            patient.                           - Pureed diet as tolerated and continue gastrostomy                            tube feedings. Do not lay down for 3 hours after                            food or two hours after water. Elevate head of bed                            when sleeping.                           - Continue present medications.                           - Return to Bariatric clinic at the next available                            appointment to discuss surgical  revision. Procedure Code(s):        --- Professional ---                           97673, Esophagoscopy, flexible, transoral;                            diagnostic, including collection of specimen(s) by                            brushing or washing, when performed (separate                            procedure) Diagnosis Code(s):        --- Professional ---                           A19.37, Other complications of other bariatric                            procedure                           R13.10, Dysphagia, unspecified CPT copyright 2016 [employer] Association. All rights reserved. The codes documented in this report are preliminary and upon coder review may  be revised to meet  current compliance requirements. Tayari Yankee L. [name], MD 01/03/2017 8:18:56 AM This report has been signed electronically. Number of Addenda: 0

## 2017-01-03 NOTE — Interval H&P Note (Signed)
History and Physical Interval Note:  01/03/2017 7:40 AM  Angie [name]  has presented today for surgery, with the diagnosis of IDA, Anemia, malnutrition  The various methods of treatment have been discussed with the patient and family. After consideration of risks, benefits and other options for treatment, the patient has consented to  Procedure(s): ESOPHAGOGASTRODUODENOSCOPY (EGD) WITH PROPOFOL (N/A) as a surgical intervention .  The patient's history has been reviewed, patient examined, no change in status, stable for surgery.  I have reviewed the patient's chart and labs.  Questions were answered to the patient's satisfaction.     Nelida Meuse III

## 2017-01-03 NOTE — Anesthesia Preprocedure Evaluation (Signed)
Anesthesia Evaluation  Patient identified by MRN, date of birth, ID band Patient awake    Reviewed: Allergy & Precautions, NPO status , Patient's Chart, lab work & pertinent test results  Airway Mallampati: I       Dental  (+) Edentulous Upper, Edentulous Lower   Pulmonary neg pulmonary ROS, former smoker,    breath sounds clear to auscultation       Cardiovascular hypertension, Pt. on medications  Rhythm:Regular Rate:Normal  ECG: NSR, rate 90  LV EF: 55%   Neuro/Psych  Headaches, Seizures -,  PSYCHIATRIC DISORDERS Anxiety Depression Bipolar Disorder    GI/Hepatic Neg liver ROS, GERD  Medicated,  Endo/Other  diabetes, Type 2  Renal/GU Renal disease     Musculoskeletal  (+) Arthritis , Fibromyalgia -Chronic low back pain   Abdominal   Peds  Hematology negative hematology ROS (+)   Anesthesia Other Findings   Reproductive/Obstetrics                             Lab Results  Component Value Date   WBC 4.2 08/19/2016   HGB 7.7 (L) 08/19/2016   HCT 24.2 (L) 08/19/2016   MCV 93.1 08/19/2016   PLT 287 08/19/2016   Lab Results  Component Value Date   CREATININE 0.46 08/19/2016   BUN 11 08/19/2016   NA 136 08/19/2016   K 4.0 08/19/2016   CL 113 (H) 08/19/2016   CO2 18 (L) 08/19/2016   Lab Results  Component Value Date   INR 0.89 08/12/2016   INR 1.06 02/12/2016   EKG: normal sinus rhythm.  Echo - Left ventricle: The cavity size was normal. Wall thickness was   normal. The estimated ejection fraction was 55%. Although no   diagnostic regional wall motion abnormality was identified, this   possibility cannot be completely excluded on the basis of this   study. Doppler parameters are consistent with abnormal left   ventricular relaxation (grade 1 diastolic dysfunction). - Aortic valve: There was no stenosis. - Mitral valve: There was no significant regurgitation. - Right  ventricle: The cavity size was normal. Systolic function   was normal. - Tricuspid valve: Peak RV-RA gradient (S): 19 mm Hg. - Pulmonary arteries: PA peak pressure: 22 mm Hg (S). - Inferior vena cava: The vessel was normal in size. The   respirophasic diameter changes were in the normal range (>= 50%),   consistent with normal central venous pressure.  Anesthesia Physical  Anesthesia Plan  ASA: II  Anesthesia Plan: MAC   Post-op Pain Management:    Induction: Intravenous  PONV Risk Score and Plan: 2 and Propofol infusion and Treatment may vary due to age or medical condition  Airway Management Planned: Natural Airway  Additional Equipment:   Intra-op Plan:   Post-operative Plan:   Informed Consent: I have reviewed the patients History and Physical, chart, labs and discussed the procedure including the risks, benefits and alternatives for the proposed anesthesia with the patient or authorized representative who has indicated his/her understanding and acceptance.   Dental advisory given  Plan Discussed with: CRNA  Anesthesia Plan Comments:         Anesthesia Quick Evaluation

## 2017-01-03 NOTE — Discharge Instructions (Signed)
YOU HAD AN ENDOSCOPIC PROCEDURE TODAY: Refer to the procedure report and other information in the discharge instructions given to you for any specific questions about what was found during the examination. If this information does not answer your questions, please call [place] office at [phone] to clarify.  ° °YOU SHOULD EXPECT: Some feelings of bloating in the abdomen. Passage of more gas than usual. Walking can help get rid of the air that was put into your GI tract during the procedure and reduce the bloating. If you had a lower endoscopy (such as a colonoscopy or flexible sigmoidoscopy) you may notice spotting of blood in your stool or on the toilet paper. Some abdominal soreness may be present for a day or two, also. ° °DIET: Your first meal following the procedure should be a light meal and then it is ok to progress to your normal diet. A half-sandwich or bowl of soup is an example of a good first meal. Heavy or fried foods are harder to digest and may make you feel nauseous or bloated. Drink plenty of fluids but you should avoid alcoholic beverages for 24 hours. If you had a esophageal dilation, please see attached instructions for diet.   ° °ACTIVITY: Your care partner should take you home directly after the procedure. You should plan to take it easy, moving slowly for the rest of the day. You can resume normal activity the day after the procedure however YOU SHOULD NOT DRIVE, use power tools, machinery or perform tasks that involve climbing or major physical exertion for 24 hours (because of the sedation medicines used during the test).  ° °SYMPTOMS TO REPORT IMMEDIATELY: °A gastroenterologist can be reached at any hour. Please call [phone]  for any of the following symptoms:  °Following lower endoscopy (colonoscopy, flexible sigmoidoscopy) °Excessive amounts of blood in the stool  °Significant tenderness, worsening of abdominal pains  °Swelling of the abdomen that is new, acute  °Fever of 100° or  higher  °Following upper endoscopy (EGD, EUS, ERCP, esophageal dilation) °Vomiting of blood or coffee ground material  °New, significant abdominal pain  °New, significant chest pain or pain under the shoulder blades  °Painful or persistently difficult swallowing  °New shortness of breath  °Black, tarry-looking or red, bloody stools ° °FOLLOW UP:  °If any biopsies were taken you will be contacted by phone or by letter within the next 1-3 weeks. Call [phone]  if you have not heard about the biopsies in 3 weeks.  °Please also call with any specific questions about appointments or follow up tests. ° °

## 2017-01-03 NOTE — Transfer of Care (Signed)
Immediate Anesthesia Transfer of Care Note  Patient: Angie [name]  Procedure(s) Performed: Procedure(s): ESOPHAGOGASTRODUODENOSCOPY (EGD) WITH PROPOFOL (N/A)  Patient Location: PACU and Endoscopy Unit  Anesthesia Type:MAC  Level of Consciousness: awake, alert , oriented and patient cooperative  Airway & Oxygen Therapy: Patient Spontanous Breathing and Patient connected to face mask oxygen  Post-op Assessment: Report given to RN, Post -op Vital signs reviewed and stable and Patient moving all extremities  Post vital signs: Reviewed and stable  Last Vitals:  Vitals:   01/03/17 0628  BP: (!) 152/92  Pulse: 73  Resp: 12  Temp: 37.1 C  SpO2: 100%    Last Pain:  Vitals:   01/03/17 0628  TempSrc: Oral         Complications: No apparent anesthesia complications

## 2017-01-04 ENCOUNTER — Encounter (HOSPITAL_COMMUNITY): Payer: Self-pay | Admitting: Gastroenterology

## 2017-01-11 ENCOUNTER — Other Ambulatory Visit: Payer: Self-pay

## 2017-01-11 ENCOUNTER — Encounter: Payer: Self-pay | Admitting: Hematology

## 2017-01-11 DIAGNOSIS — D508 Other iron deficiency anemias: Secondary | ICD-10-CM

## 2017-01-12 ENCOUNTER — Telehealth: Payer: Self-pay

## 2017-01-12 NOTE — Telephone Encounter (Signed)
Unable to contact patient on her phone to let her know about appointment with Dr. Burr Medico on 01/27/17 at 11:00 am. That office has mailed patient appointment information. I will put appointment info also in the mail to her.

## 2017-01-13 ENCOUNTER — Telehealth: Payer: Self-pay

## 2017-01-13 ENCOUNTER — Telehealth: Payer: Self-pay | Admitting: Gastroenterology

## 2017-01-13 NOTE — Telephone Encounter (Signed)
Spoke to patient, she does not want to see Dr. Kieth Brightly for revision surgery, would like to be referred elsewhere.

## 2017-01-13 NOTE — Telephone Encounter (Signed)
No answer at number given, unable to leave vm. Tried other number and that has been disconnected.

## 2017-01-18 ENCOUNTER — Encounter: Payer: Self-pay | Admitting: Hematology

## 2017-01-18 ENCOUNTER — Telehealth: Payer: Self-pay | Admitting: Gastroenterology

## 2017-01-18 NOTE — Telephone Encounter (Signed)
Patient called and she is leaking around her G-tube site. Do you have a suggestion on where to send her for gastric bypass revision, since she does not want to go back to Dr. Rich Number. Please advise, thank you.

## 2017-01-18 NOTE — Telephone Encounter (Signed)
Spoke to patient, she will contact Dr. Amie Portland office and speak to the nurse re: feeding tube. Told her to also ask about getting appointment with Dr. Redmond Pulling. Let patient know that we are trying to reach out to the teriaty Bariatric doctors to see about referring. Patient will call Dr. Amie Portland office.

## 2017-01-18 NOTE — Telephone Encounter (Signed)
I have not forgotten about her and have been making inquiries. I have a call out and am still awaiting a reply from a bariatric surgeon at [facility] so I can discuss her case with them before the referral.  I want to make sure that she sees the right person for what she needs. However, I do not know how long it will take for them to see her once I identify a doctor.  Dr. Kieth Brightly placed that feeding tube and he or one of his partners needs to take a look at it to help with the leaking.Perhaps you can help expedite that for her.  I believe Dr. Alcide Goodness in the [facility] group does bariatrics - I am not sure who else in that group does bariatrics as well.

## 2017-01-18 NOTE — Telephone Encounter (Signed)
Called patient's emergency contact, Jaquelyn Bitter, patient was at a dentist appointment and he will have her call when she gets home. I let him know that she needs to contact [facility] re: leaking feeding tube. I did speak to their triage nurse over there, patient has not contacted them as I suggested earlier today. Will wait for patient to return call, will also let her know that Dr. [name] is trying to come up with contact for possible bariatric surgery revision.

## 2017-01-25 NOTE — Telephone Encounter (Signed)
Spoke to patient, she is most grateful for this opportunity and understands the importance of compliance and making arrangements to have someone with availability to be there all day when this is scheduled. She is in agreement and wishes for [place] to make the referral to Dr. Glenna Durand office. Faxed all information to his attention.

## 2017-01-25 NOTE — Telephone Encounter (Signed)
I have communicated with a faculty GI doctor at [facility] in [place].  He is offering to place a special kind of a stent at the blockage.  That is a device which is placed by upper endoscopy and it stays in there to hold open the stricture (narrow area causing the difficulty swallowing).  I think this should be tried prior to considering surgery.  If this does not work, surgery would then be the plan.  Please send a referral to Dr. Gayleen Orem at [facility].  Fax [phone].  Send all of my EGD reports going back to late last year as well as my office notes.  Make it attention to Dr. Lysle Rubens and say that I have reviewed the case with him.  He is willing to directly book the procedure without a clinic visit first.  Of note to [facility], Dr. Lysle Rubens is a world-renowned GI doctor who does many type of advanced procedures like this. If she is agreeable to seeing him for this procedure, she MUST be sure that she can arrange for someone to come with her that day, stay the entire time, and bring her home.  She CANNOT be late or cancel, or it will be a huge opportunity lost.

## 2017-01-27 ENCOUNTER — Encounter: Payer: Medicare Other | Admitting: Hematology

## 2017-01-27 ENCOUNTER — Telehealth: Payer: Self-pay | Admitting: Hematology

## 2017-01-27 NOTE — Telephone Encounter (Signed)
Pt no showed for appt today. Attempted to call the pt and reschedule, no answer. Msg fwd to Dr. Burr Medico.

## 2017-02-07 ENCOUNTER — Telehealth: Payer: Self-pay | Admitting: Gastroenterology

## 2017-02-08 NOTE — Telephone Encounter (Signed)
Spoke to patient let her know that I called Dr. Glenna Durand office had to lvm for Nira Conn, his assistant to contact patient. Let her know that patient's phone had been disconnected until past weekend.

## 2017-02-08 NOTE — Telephone Encounter (Signed)
Left message for patient that we do not hear from the surgeons, she needs to contact them. She was suppose to contact Dr. Redmond Pulling about getting an appointment with him. I also see where patient no showed for her appointment with Dr. Burr Medico on 01/27/17 even though she was aware of appointment.

## 2017-02-08 NOTE — Telephone Encounter (Signed)
She may be asking about the referral to Dr. Gayleen Orem at [facility] (see my recent message regarding that referral). You are welcome to call Dr. Glenna Durand office to inquire, but typically they will call patient.  The plan was for them to direct book a procedure for stent placement.

## 2017-03-14 ENCOUNTER — Telehealth: Payer: Self-pay | Admitting: Gastroenterology

## 2017-03-14 NOTE — Telephone Encounter (Signed)
I suspect Dr. Lysle Rubens will leave that issue to [place].  I reviewed the two recent scope reports.  As near as I can tell, the stricture was dilated, but no stent was placed.  I do not know if he is planning to do so, so I will have to contact him to find out.  - HD

## 2017-03-14 NOTE — Telephone Encounter (Signed)
Patient called and has seen Dr. Okey Dupre twice now, is doing well after procedure with the stent. She had questions about whether or not she needs to keep the Gtube in place. I have asked her to call Dr. Stephania Fragmin office to discuss with them and what the plan is with the tube. Patient will contact Dr. Stephania Fragmin office.

## 2017-03-21 ENCOUNTER — Other Ambulatory Visit: Payer: Self-pay

## 2017-03-21 ENCOUNTER — Telehealth: Payer: Self-pay | Admitting: Gastroenterology

## 2017-03-21 DIAGNOSIS — K9189 Other postprocedural complications and disorders of digestive system: Secondary | ICD-10-CM

## 2017-03-21 NOTE — Telephone Encounter (Signed)
Spoke to patient, let her know the recommended plan for EGD w/ balloon dilation on 04/04/17. Patient verbally given instructions to be NPO after midnight, arrive at 9:00 am for 10:30 am procedure and have someone to take her home.  I have also mailed out written instructions. Patient did have a change of address, have updated this in our system.

## 2017-03-21 NOTE — Telephone Encounter (Signed)
I got a message back from Dr. Lysle Rubens at [facility].  He is not planning to place the stent if the dilations that were performed are able to keep the stricture open long term.  I am offering to bring her in for an EGD with balloon dilation of the stricture.  If goes well, I will remove the feeding tube that day.  ( Please find some time in my [facility] endo schedule to do that - best plan would be December 10th- currently looks like an opening between by 3rd and 4th patients that AM.  Can also go 03/1229 that day if needed).

## 2017-03-25 ENCOUNTER — Other Ambulatory Visit: Payer: Self-pay

## 2017-03-25 ENCOUNTER — Encounter (HOSPITAL_COMMUNITY): Payer: Self-pay | Admitting: Emergency Medicine

## 2017-04-01 ENCOUNTER — Other Ambulatory Visit: Payer: Self-pay

## 2017-04-03 ENCOUNTER — Telehealth: Payer: Self-pay | Admitting: Gastroenterology

## 2017-04-03 ENCOUNTER — Encounter (HOSPITAL_COMMUNITY): Payer: Self-pay | Admitting: Anesthesiology
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
## 2017-04-03 NOTE — Anesthesia Preprocedure Evaluation (Deleted)
Anesthesia Evaluation  Patient identified by MRN, date of birth, ID band Patient awake    Reviewed: Allergy & Precautions, NPO status , Patient's Chart, lab work & pertinent test results  Airway Mallampati: I       Dental  (+) Edentulous Upper, Edentulous Lower   Pulmonary neg pulmonary ROS, former smoker,    breath sounds clear to auscultation       Cardiovascular hypertension, Pt. on medications  Rhythm:Regular Rate:Normal  ECG: NSR, rate 90  LV EF: 55%   Neuro/Psych  Headaches, Seizures -,  PSYCHIATRIC DISORDERS Anxiety Depression Bipolar Disorder    GI/Hepatic Neg liver ROS, GERD  Medicated,(+)     substance abuse  alcohol use,   Endo/Other  diabetes, Type 2  Renal/GU Renal disease     Musculoskeletal  (+) Arthritis , Fibromyalgia -Chronic low back pain   Abdominal   Peds  Hematology negative hematology ROS (+)   Anesthesia Other Findings   Reproductive/Obstetrics                             Lab Results  Component Value Date   WBC 4.2 08/19/2016   HGB 7.7 (L) 08/19/2016   HCT 24.2 (L) 08/19/2016   MCV 93.1 08/19/2016   PLT 287 08/19/2016   Lab Results  Component Value Date   CREATININE 0.46 08/19/2016   BUN 11 08/19/2016   NA 136 08/19/2016   K 4.0 08/19/2016   CL 113 (H) 08/19/2016   CO2 18 (L) 08/19/2016   Lab Results  Component Value Date   INR 0.89 08/12/2016   INR 1.06 02/12/2016   EKG: normal sinus rhythm.  Echo - Left ventricle: The cavity size was normal. Wall thickness was   normal. The estimated ejection fraction was 55%. Although no   diagnostic regional wall motion abnormality was identified, this   possibility cannot be completely excluded on the basis of this   study. Doppler parameters are consistent with abnormal left   ventricular relaxation (grade 1 diastolic dysfunction). - Aortic valve: There was no stenosis. - Mitral valve: There was no  significant regurgitation. - Right ventricle: The cavity size was normal. Systolic function   was normal. - Tricuspid valve: Peak RV-RA gradient (S): 19 mm Hg. - Pulmonary arteries: PA peak pressure: 22 mm Hg (S). - Inferior vena cava: The vessel was normal in size. The   respirophasic diameter changes were in the normal range (>= 50%),   consistent with normal central venous pressure.  Anesthesia Physical  Anesthesia Plan  ASA: III  Anesthesia Plan: MAC   Post-op Pain Management:    Induction: Intravenous  PONV Risk Score and Plan: 2 and Propofol infusion and Treatment may vary due to age or medical condition  Airway Management Planned: Natural Airway and Nasal Cannula  Additional Equipment:   Intra-op Plan:   Post-operative Plan:   Informed Consent: I have reviewed the patients History and Physical, chart, labs and discussed the procedure including the risks, benefits and alternatives for the proposed anesthesia with the patient or authorized representative who has indicated his/her understanding and acceptance.   Dental advisory given  Plan Discussed with: CRNA and Anesthesiologist  Anesthesia Plan Comments:         Anesthesia Quick Evaluation

## 2017-04-03 NOTE — Telephone Encounter (Signed)
Thanks, we'll give her a call in the AM I was going to remove her G tube tomorrow anyway.

## 2017-04-04 ENCOUNTER — Encounter (HOSPITAL_COMMUNITY): Payer: Self-pay | Admitting: Certified Registered"

## 2017-04-04 ENCOUNTER — Ambulatory Visit (HOSPITAL_COMMUNITY): Admission: RE | Admit: 2017-04-04 | Payer: Medicare Other | Source: Ambulatory Visit | Admitting: Gastroenterology

## 2017-04-04 SURGERY — ESOPHAGOGASTRODUODENOSCOPY (EGD) WITH PROPOFOL
Anesthesia: Monitor Anesthesia Care

## 2017-04-04 MED ORDER — PROPOFOL 10 MG/ML IV BOLUS
INTRAVENOUS | Status: AC
  Filled 2017-04-04: qty 40

## 2017-04-06 ENCOUNTER — Telehealth: Payer: Self-pay

## 2017-04-06 NOTE — Telephone Encounter (Signed)
Spoke to patient, I see where Dr. Ardis Hughs let you know about tubing falling out on 03/29/17, patient states she has been loosing weight ever since, teeth falling out, "gummy feeling" on the back of her tongue. Dr. Kieth Brightly was the one who initially put it in.  Patient is okay with rescheduled EGD at [facility] on 04/21/17 at 7:30.

## 2017-04-06 NOTE — Telephone Encounter (Signed)
I am aware that the tube came out - I was going to remove it this week anyway because she had 2 successafull dilations performed at [facility] recently.  She will most likely need another dilation on the 27th, and I am afraid that is the soonest I can do it.

## 2017-04-08 ENCOUNTER — Other Ambulatory Visit (HOSPITAL_COMMUNITY): Payer: Self-pay | Admitting: General Surgery

## 2017-04-08 DIAGNOSIS — K912 Postsurgical malabsorption, not elsewhere classified: Secondary | ICD-10-CM

## 2017-04-15 ENCOUNTER — Other Ambulatory Visit (HOSPITAL_COMMUNITY): Payer: Medicare Other

## 2017-04-18 ENCOUNTER — Telehealth (HOSPITAL_COMMUNITY): Payer: Self-pay

## 2017-04-18 ENCOUNTER — Other Ambulatory Visit (HOSPITAL_COMMUNITY): Payer: Self-pay | Admitting: Surgery

## 2017-04-18 DIAGNOSIS — K912 Postsurgical malabsorption, not elsewhere classified: Secondary | ICD-10-CM

## 2017-04-18 NOTE — Telephone Encounter (Signed)
Called to schedule gtube replacement, no answer, left vm. AW

## 2017-04-20 ENCOUNTER — Encounter (HOSPITAL_COMMUNITY): Payer: Self-pay | Admitting: *Deleted

## 2017-04-20 ENCOUNTER — Other Ambulatory Visit: Payer: Self-pay

## 2017-04-20 NOTE — Anesthesia Preprocedure Evaluation (Addendum)
Anesthesia Evaluation  Patient identified by MRN, date of birth, ID band Patient awake    Reviewed: Allergy & Precautions, NPO status , Patient's Chart, lab work & pertinent test results  Airway Mallampati: I  TM Distance: >3 FB Neck ROM: Full    Dental  (+) Edentulous Upper, Edentulous Lower   Pulmonary former smoker,    Pulmonary exam normal breath sounds clear to auscultation       Cardiovascular hypertension, Normal cardiovascular exam Rhythm:Regular Rate:Normal     Neuro/Psych  Headaches, Seizures -, Well Controlled,  PSYCHIATRIC DISORDERS Anxiety Depression Bipolar Disorder  Neuromuscular disease    GI/Hepatic (+)     substance abuse  alcohol use, 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   Endo/Other  diabetes, Well ControlledSevere Protein Calorie Malnutrition  Renal/GU negative Renal ROS  negative genitourinary   Musculoskeletal  (+) Arthritis , Fibromyalgia -  Abdominal (+) + scaphoid   Peds  Hematology  (+) anemia ,   Anesthesia Other Findings   Reproductive/Obstetrics                            Anesthesia Physical Anesthesia Plan  ASA: III  Anesthesia Plan: MAC   Post-op Pain Management:    Induction: Intravenous  PONV Risk Score and Plan: Treatment may vary due to age or medical condition, Ondansetron, Propofol infusion and Midazolam  Airway Management Planned: Natural Airway and Nasal Cannula  Additional Equipment:   Intra-op Plan:   Post-operative Plan:   Informed Consent: I have reviewed the patients History and Physical, chart, labs and discussed the procedure including the risks, benefits and alternatives for the proposed anesthesia with the patient or authorized representative who has indicated his/her understanding and acceptance.     Plan Discussed with: Anesthesiologist, CRNA and Surgeon  Anesthesia Plan Comments:         Anesthesia Quick Evaluation

## 2017-04-20 NOTE — Progress Notes (Signed)
Spoke with pt for pre-op call. Pt denies cardiac history, chest pain or sob. Pt states she is no longer diabetic since her gastric bypass in 2007.  Ekg 03/2016 in Epic Echo 12/2015 in [place] - 2008

## 2017-04-20 NOTE — Progress Notes (Signed)
Spoke with pt to complete pre-op assessment and pt stated " I am not going to do that right now, I have done that a number of times."  Pt was asked if she could return the call at a later time. Pt hung up.

## 2017-04-21 ENCOUNTER — Ambulatory Visit (HOSPITAL_COMMUNITY)
Admission: RE | Admit: 2017-04-21 | Discharge: 2017-04-21 | Disposition: A | Discharge status: Home / Self Care | Payer: Medicare Other | Source: Ambulatory Visit | Attending: Gastroenterology | Admitting: Gastroenterology

## 2017-04-21 ENCOUNTER — Ambulatory Visit (HOSPITAL_COMMUNITY): Payer: Medicare Other | Admitting: Certified Registered Nurse Anesthetist

## 2017-04-21 ENCOUNTER — Encounter (HOSPITAL_COMMUNITY)
Admission: RE | Disposition: A | Discharge status: Home / Self Care | Payer: Self-pay | Source: Ambulatory Visit | Attending: Gastroenterology

## 2017-04-21 ENCOUNTER — Encounter (HOSPITAL_COMMUNITY): Payer: Self-pay | Admitting: Gastroenterology

## 2017-04-21 ENCOUNTER — Other Ambulatory Visit: Payer: Self-pay

## 2017-04-21 DIAGNOSIS — Z681 Body mass index (BMI) 19 or less, adult: Secondary | ICD-10-CM | POA: Diagnosis not present

## 2017-04-21 DIAGNOSIS — E739 Lactose intolerance, unspecified: Secondary | ICD-10-CM | POA: Diagnosis not present

## 2017-04-21 DIAGNOSIS — R1310 Dysphagia, unspecified: Secondary | ICD-10-CM | POA: Insufficient documentation

## 2017-04-21 DIAGNOSIS — K9589 Other complications of other bariatric procedure: Secondary | ICD-10-CM

## 2017-04-21 DIAGNOSIS — Z888 Allergy status to other drugs, medicaments and biological substances status: Secondary | ICD-10-CM | POA: Diagnosis not present

## 2017-04-21 DIAGNOSIS — M1990 Unspecified osteoarthritis, unspecified site: Secondary | ICD-10-CM | POA: Insufficient documentation

## 2017-04-21 DIAGNOSIS — F419 Anxiety disorder, unspecified: Secondary | ICD-10-CM | POA: Insufficient documentation

## 2017-04-21 DIAGNOSIS — I10 Essential (primary) hypertension: Secondary | ICD-10-CM | POA: Diagnosis not present

## 2017-04-21 DIAGNOSIS — Z87891 Personal history of nicotine dependence: Secondary | ICD-10-CM | POA: Insufficient documentation

## 2017-04-21 DIAGNOSIS — R634 Abnormal weight loss: Secondary | ICD-10-CM

## 2017-04-21 DIAGNOSIS — Y848 Other medical procedures as the cause of abnormal reaction of the patient, or of later complication, without mention of misadventure at the time of the procedure: Secondary | ICD-10-CM | POA: Insufficient documentation

## 2017-04-21 DIAGNOSIS — E43 Unspecified severe protein-calorie malnutrition: Secondary | ICD-10-CM | POA: Diagnosis not present

## 2017-04-21 DIAGNOSIS — F319 Bipolar disorder, unspecified: Secondary | ICD-10-CM | POA: Insufficient documentation

## 2017-04-21 DIAGNOSIS — M797 Fibromyalgia: Secondary | ICD-10-CM | POA: Diagnosis not present

## 2017-04-21 DIAGNOSIS — Z980 Intestinal bypass and anastomosis status: Secondary | ICD-10-CM | POA: Diagnosis not present

## 2017-04-21 HISTORY — PX: BALLOON DILATION: SHX5330

## 2017-04-21 HISTORY — PX: ESOPHAGOGASTRODUODENOSCOPY (EGD) WITH PROPOFOL: SHX5813

## 2017-04-21 HISTORY — DX: Diverticulitis of intestine, part unspecified, without perforation or abscess without bleeding: K57.92

## 2017-04-21 SURGERY — ESOPHAGOGASTRODUODENOSCOPY (EGD) WITH PROPOFOL
Anesthesia: Monitor Anesthesia Care

## 2017-04-21 MED ORDER — LIDOCAINE 2% (20 MG/ML) 5 ML SYRINGE
INTRAMUSCULAR | Status: DC | PRN
  Administered 2017-04-21: 100 mg via INTRAVENOUS

## 2017-04-21 MED ORDER — PROPOFOL 10 MG/ML IV BOLUS
INTRAVENOUS | Status: DC | PRN
  Administered 2017-04-21 (×3): 20 mg via INTRAVENOUS
  Administered 2017-04-21: 30 mg via INTRAVENOUS
  Administered 2017-04-21: 40 mg via INTRAVENOUS

## 2017-04-21 MED ORDER — PROPOFOL 500 MG/50ML IV EMUL
INTRAVENOUS | Status: DC | PRN
  Administered 2017-04-21: 100 ug/kg/min via INTRAVENOUS

## 2017-04-21 MED ORDER — LACTATED RINGERS IV SOLN
INTRAVENOUS | Status: DC
  Administered 2017-04-21: 1000 mL via INTRAVENOUS

## 2017-04-21 MED ORDER — LACTATED RINGERS IV SOLN
INTRAVENOUS | Status: DC | PRN
Start: 2017-04-21 — End: 2017-04-21
  Administered 2017-04-21: 08:00:00 via INTRAVENOUS

## 2017-04-21 SURGICAL SUPPLY — 15 items
BLOCK BITE 60FR ADLT L/F BLUE (MISCELLANEOUS) ×2 IMPLANT
ELECT REM PT RETURN 9FT ADLT (ELECTROSURGICAL) IMPLANT
ELECTRODE REM PT RTRN 9FT ADLT (ELECTROSURGICAL) IMPLANT
FORCEP RJ3 GP 1.8X160 W-NEEDLE (CUTTING FORCEPS) IMPLANT
FORCEPS BIOP RAD 4 LRG CAP 4 (CUTTING FORCEPS) IMPLANT
NDL SCLEROTHERAPY 25GX240 (NEEDLE) IMPLANT
NEEDLE SCLEROTHERAPY 25GX240 (NEEDLE) IMPLANT
PROBE APC STR FIRE (PROBE) IMPLANT
PROBE INJECTION GOLD (MISCELLANEOUS)
PROBE INJECTION GOLD 7FR (MISCELLANEOUS) IMPLANT
SNARE SHORT THROW 13M SML OVAL (MISCELLANEOUS) IMPLANT
SYR 50ML LL SCALE MARK (SYRINGE) IMPLANT
TUBING ENDO SMARTCAP PENTAX (MISCELLANEOUS) ×4 IMPLANT
TUBING IRRIGATION ENDOGATOR (MISCELLANEOUS) ×2 IMPLANT
WATER STERILE IRR 1000ML POUR (IV SOLUTION) IMPLANT

## 2017-04-21 NOTE — Interval H&P Note (Signed)
History and Physical Interval Note:  04/21/2017 7:42 AM  Angie [name]  has presented today for surgery, with the diagnosis of gastrojejunal anastamosis stricture  The various methods of treatment have been discussed with the patient and family. After consideration of risks, benefits and other options for treatment, the patient has consented to  Procedure(s): ESOPHAGOGASTRODUODENOSCOPY (EGD) WITH PROPOFOL (N/A) BALLOON DILATION (N/A) as a surgical intervention .  The patient's history has been reviewed, patient examined, no change in status, stable for surgery.  I have reviewed the patient's chart and labs.  Questions were answered to the patient's satisfaction.     Nelida Meuse III

## 2017-04-21 NOTE — Op Note (Signed)
[facility] Patient Name: Angie [name] Procedure Date : 04/21/2017 MRN: [id number] Attending MD: Estill Cotta. [name] , MD Date of Birth: April 27, 1965 CSN: [id number] Age: 51 Admit Type: Outpatient Procedure:                Upper GI endoscopy Indications:              Dysphagia, For therapy of post-bariatric                            anastomotic stenosis, Weight loss Providers:                Mallie Mussel L. [name], MD, Elmer Ramp. Tilden Dome, RN, Tinnie Gens, Technician Referring MD:              Medicines:                Monitored Anesthesia Care Complications:            No immediate complications. Estimated Blood Loss:     Estimated blood loss was minimal. Procedure:                Pre-Anesthesia Assessment:                           - Prior to the procedure, a History and Physical                            was performed, and patient medications and                            allergies were reviewed. The patient's tolerance of                            previous anesthesia was also reviewed. The risks                            and benefits of the procedure and the sedation                            options and risks were discussed with the patient.                            All questions were answered, and informed consent                            was obtained. Prior Anticoagulants: The patient has                            taken no previous anticoagulant or antiplatelet                            agents. ASA Grade Assessment: III - A patient with  severe systemic disease. After reviewing the risks                            and benefits, the patient was deemed in                            satisfactory condition to undergo the procedure.                           After obtaining informed consent, the endoscope was                            passed under direct vision. Throughout the                            procedure, the  patient's blood pressure, pulse, and                            oxygen saturations were monitored continuously. The                            EG-[id number] ([id number]) scope was introduced through the                            mouth, and advanced to the jejunum. The upper GI                            endoscopy was accomplished without difficulty. The                            patient tolerated the procedure well. Scope In: Scope Out: Findings:      The esophagus was normal.      Evidence of a Roux-en-Y gastrojejunostomy was found. The gastrojejunal       anastomosis was characterized by severe stenosis. This was traversed       after dilation. The pouch-to-jejunum limb was characterized by       ulceration and an intact staple line. A TTS dilator was passed through       the scope. Dilation with a 6-7-8 mm balloon, a 02-04-11 mm balloon and a       12-13.5-15 mm balloon dilator was performed to 15 mm. The dilation site       was examined and showed moderate improvement in luminal narrowing.       Estimated blood loss was minimal.      The examined jejunum was normal. Impression:               - Normal esophagus.                           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  Dilated.                           - Normal examined jejunum.                           - No specimens collected. Moderate Sedation:      MAC sedation used Recommendation:           - Patient has a contact number available for                            emergencies. The signs and symptoms of potential                            delayed complications were discussed with the                            patient. Return to normal activities tomorrow.                            Written discharge instructions were provided to the                            patient.                           - Pureed diet.                            - Continue present medications.                           - Refer back to Dr. Gayleen Orem at [facility] for                            placement of LAMS in order to help patient improve                            oral intake and nutritional status, thereby                            possibly avoiding another jejunal tube and/or                            revision surgery. Procedure Code(s):        --- Professional ---                           [phone], Esophagogastroduodenoscopy, flexible,                            transoral; with dilation of gastric/duodenal                            stricture(s) (eg, balloon, bougie) Diagnosis Code(s):        --- Professional ---  Z98.0, Intestinal bypass and anastomosis status                           R13.10, Dysphagia, unspecified                           B52.08, Other complications of other bariatric                            procedure                           R63.4, Abnormal weight loss CPT copyright 2016 [employer]. All rights reserved. The codes documented in this report are preliminary and upon coder review may  be revised to meet current compliance requirements. Linkin Vizzini L. [name], MD 04/21/2017 8:32:03 AM This report has been signed electronically. Number of Addenda: 0

## 2017-04-21 NOTE — Anesthesia Postprocedure Evaluation (Signed)
Anesthesia Post Note  Patient: Angie [name]  Procedure(s) Performed: ESOPHAGOGASTRODUODENOSCOPY (EGD) WITH PROPOFOL (N/A ) BALLOON DILATION (N/A )     Patient location during evaluation: Endoscopy Anesthesia Type: MAC Level of consciousness: awake and alert and oriented Pain management: pain level controlled Vital Signs Assessment: post-procedure vital signs reviewed and stable Respiratory status: nonlabored ventilation, spontaneous breathing and respiratory function stable Cardiovascular status: blood pressure returned to baseline and stable Postop Assessment: no apparent nausea or vomiting Anesthetic complications: no    Last Vitals:  Vitals:   04/21/17 0658 04/21/17 0823  BP: (!) 144/98 (!) 134/98  Pulse: 83 84  Resp: 20 14  Temp: 36.7 C 36.6 C  SpO2: 100% 100%    Last Pain:  Vitals:   04/21/17 0823  TempSrc: Oral  PainSc:                  Erza Mothershead A.

## 2017-04-21 NOTE — H&P (Signed)
History:  This patient presents for endoscopic testing for anastomotic stricture, dysphagia, weight loss.  Angie [name] Referring physician: Ricke Hey, MD  Past Medical History: Past Medical History:  Diagnosis Date  . Abnormal Pap smear   . Alcohol abuse   . Anemia    IDA  . Anxiety    Takes xanax  . Arthritis    "qwhere" (04/26/2016),   . Bipolar disorder ([place])   . Blood transfusion without reported diagnosis   . Bulging lumbar disc   . Chronic lower back pain   . Depression   . Diverticulitis   . Fibromyalgia   . Hypertension   . Lactose intolerance in adult 2017  . Migraines    "weekly @ least" (04/26/2016)  . Seizures ([place])    last one Dec. 2017  . Type II diabetes mellitus ([place])    "before the gastric bypass" (04/26/2016) states she no longer has diabetes     Past Surgical History: Past Surgical History:  Procedure Laterality Date  . BALLOON DILATION N/A 05/27/2016   Procedure: BALLOON DILATION;  Surgeon: Doran Stabler, MD;  Location: [facility];  Service: Gastroenterology;  Laterality: N/A;  . CERVICAL CONE BIOPSY    . [place]; 1996; 1998; 2014  . CESAREAN SECTION WITH BILATERAL TUBAL LIGATION Bilateral 10/28/2012   Procedure: Repeat cesarean section with delivery of baby boy at 71. Apgars 8/9.  BILATERAL TUBAL LIGATION;  Surgeon: Florian Buff, MD;  Location: [facility];  Service: Obstetrics;  Laterality: Bilateral;  . DILATION AND CURETTAGE OF UTERUS    . ESOPHAGOGASTRODUODENOSCOPY N/A 05/27/2016   Procedure: ESOPHAGOGASTRODUODENOSCOPY (EGD);  Surgeon: Doran Stabler, MD;  Location: [facility];  Service: Gastroenterology;  Laterality: N/A;  . ESOPHAGOGASTRODUODENOSCOPY (EGD) WITH PROPOFOL N/A 04/23/2016   Procedure: ESOPHAGOGASTRODUODENOSCOPY (EGD) WITH PROPOFOL;  Surgeon: Doran Stabler, MD;  Location: [facility];  Service: Endoscopy;  Laterality: N/A;  . ESOPHAGOGASTRODUODENOSCOPY (EGD) WITH PROPOFOL N/A 01/03/2017   Procedure:  ESOPHAGOGASTRODUODENOSCOPY (EGD) WITH PROPOFOL;  Surgeon: Doran Stabler, MD;  Location: [facility];  Service: Gastroenterology;  Laterality: N/A;  . GASTROSTOMY N/A 08/16/2016   Procedure: LAPAROSCOPIC INSERTION OF GASTROSTOMY TUBE IN REMNANT STOMACH;  Surgeon: Arta Bruce Kinsinger, MD;  Location: [facility];  Service: General;  Laterality: N/A;  . ROUX-EN-Y GASTRIC BYPASS  2007  . TUBAL LIGATION  2014    Allergies: Allergies  Allergen Reactions  . Iron     Had acute allergy with tachycardia, attention and generalized itching shortly after receiving nulecit ( iron gluconate) infusion.    Outpatient Meds: Current Facility-Administered Medications  Medication Dose Route Frequency Provider Last Rate Last Dose  . lactated ringers infusion   Intravenous Continuous Nelida Meuse III, MD 125 mL/hr at 04/21/17 0722 1,000 mL at 04/21/17 0981      ___________________________________________________________________ Objective   Exam:  BP (!) 144/98   Pulse 83   Temp 98.1 F (36.7 C) (Oral)   Resp 20   Ht 5' (1.524 m)   Wt 105 lb (47.6 kg)   LMP 03/25/2017   SpO2 100%   BMI 20.51 kg/m  cachectic  CV: RRR without murmur, S1/S2, no JVD, no peripheral edema  Resp: clear to auscultation bilaterally, normal RR and effort noted  GI: soft, no tenderness, with active bowel sounds. No guarding or palpable organomegaly noted. J tube site healed  Neuro: awake, alert and oriented x 3. 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 Then referral back to Dr. Gayleen Orem at [place] for [facility] placement in hopes of avoiding revision surgery.   Nelida Meuse III

## 2017-04-21 NOTE — Transfer of Care (Signed)
Immediate Anesthesia Transfer of Care Note  Patient: Meria D Olshefski  Procedure(s) Performed: ESOPHAGOGASTRODUODENOSCOPY (EGD) WITH PROPOFOL (N/A ) BALLOON DILATION (N/A )  Patient Location: Endoscopy Unit  Anesthesia Type:MAC  Level of Consciousness: drowsy  Airway & Oxygen Therapy: Patient Spontanous Breathing and Patient connected to nasal cannula oxygen  Post-op Assessment: Report given to RN and Post -op Vital signs reviewed and stable  Post vital signs: Reviewed and stable  Last Vitals:  Vitals:   04/21/17 0658  BP: (!) 144/98  Pulse: 83  Resp: 20  Temp: 36.7 C  SpO2: 100%    Last Pain:  Vitals:   04/21/17 0658  TempSrc: Oral  PainSc:          Complications: No apparent anesthesia complications

## 2017-04-21 NOTE — Discharge Instructions (Signed)
YOU HAD AN ENDOSCOPIC PROCEDURE TODAY: Refer to the procedure report and other information in the discharge instructions given to you for any specific questions about what was found during the examination. If this information does not answer your questions, please call [place] office at [phone] to clarify.   YOU SHOULD EXPECT: Some feelings of bloating in the abdomen. Passage of more gas than usual. Walking can help get rid of the air that was put into your GI tract during the procedure and reduce the bloating. If you had a lower endoscopy (such as a colonoscopy or flexible sigmoidoscopy) you may notice spotting of blood in your stool or on the toilet paper. Some abdominal soreness may be present for a day or two, also.  DIET: Your first meal following the procedure should be a light meal and then it is ok to progress to your normal diet. A half-sandwich or bowl of soup is an example of a good first meal. Heavy or fried foods are harder to digest and may make you feel nauseous or bloated. Drink plenty of fluids but you should avoid alcoholic beverages for 24 hours. If you had a esophageal dilation, please see attached instructions for diet.    ACTIVITY: Your care partner should take you home directly after the procedure. You should plan to take it easy, moving slowly for the rest of the day. You can resume normal activity the day after the procedure however YOU SHOULD NOT DRIVE, use power tools, machinery or perform tasks that involve climbing or major physical exertion for 24 hours (because of the sedation medicines used during the test).   SYMPTOMS TO REPORT IMMEDIATELY: A gastroenterologist can be reached at any hour. Please call [phone]  for any of the following symptoms:   Following upper endoscopy (EGD, EUS, ERCP, esophageal dilation) Vomiting of blood or coffee ground material  New, significant abdominal pain  New, significant chest pain or pain under the shoulder blades  Painful or  persistently difficult swallowing  New shortness of breath  Black, tarry-looking or red, bloody stools  FOLLOW UP:  If any biopsies were taken you will be contacted by phone or by letter within the next 1-3 weeks. Call [phone]  if you have not heard about the biopsies in 3 weeks.  Please also call with any specific questions about appointments or follow up tests.  

## 2017-04-22 ENCOUNTER — Other Ambulatory Visit: Payer: Self-pay | Admitting: Radiology

## 2017-04-25 ENCOUNTER — Encounter (HOSPITAL_COMMUNITY): Payer: Self-pay | Admitting: Emergency Medicine

## 2017-04-25 ENCOUNTER — Encounter (HOSPITAL_COMMUNITY): Payer: Self-pay

## 2017-04-25 ENCOUNTER — Ambulatory Visit (HOSPITAL_COMMUNITY)
Admission: RE | Admit: 2017-04-25 | Discharge: 2017-04-25 | Disposition: A | Discharge status: Home / Self Care | Payer: Medicare Other | Source: Ambulatory Visit | Attending: Surgery | Admitting: Surgery

## 2017-04-25 ENCOUNTER — Inpatient Hospital Stay (HOSPITAL_COMMUNITY)
Admission: EM | Admit: 2017-04-25 | Discharge: 2017-04-28 | DRG: 811 | Disposition: A | Discharge status: Home / Self Care | Payer: Medicare Other | Attending: Infectious Disease | Admitting: Infectious Disease

## 2017-04-25 ENCOUNTER — Encounter (HOSPITAL_COMMUNITY): Payer: Self-pay | Admitting: *Deleted

## 2017-04-25 ENCOUNTER — Other Ambulatory Visit: Payer: Self-pay

## 2017-04-25 DIAGNOSIS — K222 Esophageal obstruction: Secondary | ICD-10-CM | POA: Diagnosis present

## 2017-04-25 DIAGNOSIS — G894 Chronic pain syndrome: Secondary | ICD-10-CM | POA: Diagnosis present

## 2017-04-25 DIAGNOSIS — Z800 Family history of malignant neoplasm of digestive organs: Secondary | ICD-10-CM | POA: Diagnosis not present

## 2017-04-25 DIAGNOSIS — R634 Abnormal weight loss: Secondary | ICD-10-CM | POA: Diagnosis present

## 2017-04-25 DIAGNOSIS — D649 Anemia, unspecified: Secondary | ICD-10-CM | POA: Diagnosis not present

## 2017-04-25 DIAGNOSIS — Z79891 Long term (current) use of opiate analgesic: Secondary | ICD-10-CM

## 2017-04-25 DIAGNOSIS — I10 Essential (primary) hypertension: Secondary | ICD-10-CM | POA: Diagnosis present

## 2017-04-25 DIAGNOSIS — M1990 Unspecified osteoarthritis, unspecified site: Secondary | ICD-10-CM | POA: Diagnosis present

## 2017-04-25 DIAGNOSIS — F319 Bipolar disorder, unspecified: Secondary | ICD-10-CM | POA: Diagnosis present

## 2017-04-25 DIAGNOSIS — E43 Unspecified severe protein-calorie malnutrition: Secondary | ICD-10-CM | POA: Diagnosis present

## 2017-04-25 DIAGNOSIS — Z833 Family history of diabetes mellitus: Secondary | ICD-10-CM

## 2017-04-25 DIAGNOSIS — Z818 Family history of other mental and behavioral disorders: Secondary | ICD-10-CM

## 2017-04-25 DIAGNOSIS — D508 Other iron deficiency anemias: Secondary | ICD-10-CM | POA: Diagnosis not present

## 2017-04-25 DIAGNOSIS — Z87891 Personal history of nicotine dependence: Secondary | ICD-10-CM

## 2017-04-25 DIAGNOSIS — E739 Lactose intolerance, unspecified: Secondary | ICD-10-CM | POA: Diagnosis present

## 2017-04-25 DIAGNOSIS — D509 Iron deficiency anemia, unspecified: Secondary | ICD-10-CM | POA: Diagnosis present

## 2017-04-25 DIAGNOSIS — M545 Low back pain: Secondary | ICD-10-CM | POA: Diagnosis present

## 2017-04-25 DIAGNOSIS — R1310 Dysphagia, unspecified: Secondary | ICD-10-CM | POA: Diagnosis not present

## 2017-04-25 DIAGNOSIS — F419 Anxiety disorder, unspecified: Secondary | ICD-10-CM | POA: Diagnosis present

## 2017-04-25 DIAGNOSIS — R569 Unspecified convulsions: Secondary | ICD-10-CM | POA: Diagnosis present

## 2017-04-25 DIAGNOSIS — D500 Iron deficiency anemia secondary to blood loss (chronic): Secondary | ICD-10-CM | POA: Diagnosis not present

## 2017-04-25 DIAGNOSIS — Z888 Allergy status to other drugs, medicaments and biological substances status: Secondary | ICD-10-CM

## 2017-04-25 DIAGNOSIS — Z79899 Other long term (current) drug therapy: Secondary | ICD-10-CM | POA: Diagnosis not present

## 2017-04-25 DIAGNOSIS — Z6821 Body mass index (BMI) 21.0-21.9, adult: Secondary | ICD-10-CM | POA: Diagnosis not present

## 2017-04-25 DIAGNOSIS — E876 Hypokalemia: Secondary | ICD-10-CM | POA: Diagnosis present

## 2017-04-25 DIAGNOSIS — E538 Deficiency of other specified B group vitamins: Secondary | ICD-10-CM | POA: Diagnosis present

## 2017-04-25 DIAGNOSIS — Z8249 Family history of ischemic heart disease and other diseases of the circulatory system: Secondary | ICD-10-CM

## 2017-04-25 DIAGNOSIS — K912 Postsurgical malabsorption, not elsewhere classified: Secondary | ICD-10-CM

## 2017-04-25 DIAGNOSIS — M797 Fibromyalgia: Secondary | ICD-10-CM | POA: Diagnosis present

## 2017-04-25 DIAGNOSIS — Z9884 Bariatric surgery status: Secondary | ICD-10-CM | POA: Diagnosis not present

## 2017-04-25 HISTORY — DX: Gastro-esophageal reflux disease without esophagitis: K21.9

## 2017-04-25 LAB — PREPARE RBC (CROSSMATCH)

## 2017-04-25 LAB — CBC
HCT: 20.8 % — ABNORMAL LOW (ref 36.0–46.0)
HCT: 19.7 % — ABNORMAL LOW (ref 36.0–46.0)
Hemoglobin: 6.6 g/dL — CL (ref 12.0–15.0)
Hemoglobin: 6.2 g/dL — CL (ref 12.0–15.0)
MCH: 22.7 pg — ABNORMAL LOW (ref 26.0–34.0)
MCH: 22.5 pg — ABNORMAL LOW (ref 26.0–34.0)
MCHC: 31.7 g/dL (ref 30.0–36.0)
MCHC: 31.5 g/dL (ref 30.0–36.0)
MCV: 71.5 fL — ABNORMAL LOW (ref 78.0–100.0)
MCV: 71.4 fL — ABNORMAL LOW (ref 78.0–100.0)
Platelets: 416 10*3/uL — ABNORMAL HIGH (ref 150–400)
Platelets: 348 10*3/uL (ref 150–400)
RBC: 2.91 MIL/uL — ABNORMAL LOW (ref 3.87–5.11)
RBC: 2.76 MIL/uL — ABNORMAL LOW (ref 3.87–5.11)
RDW: 19.5 % — ABNORMAL HIGH (ref 11.5–15.5)
RDW: 19.5 % — ABNORMAL HIGH (ref 11.5–15.5)
WBC: 3.7 10*3/uL — ABNORMAL LOW (ref 4.0–10.5)
WBC: 2.8 10*3/uL — ABNORMAL LOW (ref 4.0–10.5)

## 2017-04-25 LAB — BASIC METABOLIC PANEL
Anion gap: 11 (ref 5–15)
BUN: 9 mg/dL (ref 6–20)
CO2: 21 mmol/L — ABNORMAL LOW (ref 22–32)
Calcium: 8.2 mg/dL — ABNORMAL LOW (ref 8.9–10.3)
Chloride: 103 mmol/L (ref 101–111)
Creatinine, Ser: 0.74 mg/dL (ref 0.44–1.00)
GFR calc Af Amer: >60 mL/min (ref >60)
GFR calc non Af Amer: >60 mL/min (ref >60)
Glucose, Bld: 57 mg/dL — ABNORMAL LOW (ref 65–99)
Potassium: 2.9 mmol/L — ABNORMAL LOW (ref 3.5–5.1)
Sodium: 135 mmol/L (ref 135–145)

## 2017-04-25 LAB — OCCULT BLOOD X 1 CARD TO LAB, STOOL: Fecal Occult Bld: NEGATIVE

## 2017-04-25 LAB — PROTIME-INR
INR: 1
Prothrombin Time: 13.1 seconds (ref 11.4–15.2)

## 2017-04-25 LAB — HCG, SERUM, QUALITATIVE: Preg, Serum: NEGATIVE

## 2017-04-25 LAB — APTT: aPTT: 34 s (ref 24–36)

## 2017-04-25 MED ORDER — PREGABALIN 50 MG PO CAPS
50.0000 mg | ORAL_CAPSULE | Freq: Three times a day (TID) | ORAL | Status: DC
  Administered 2017-04-25 – 2017-04-28 (×7): 50 mg via ORAL
  Filled 2017-04-25 (×3): qty 1
  Filled 2017-04-25: qty 2
  Filled 2017-04-25: qty 1
  Filled 2017-04-25: qty 2
  Filled 2017-04-25 (×2): qty 1

## 2017-04-25 MED ORDER — SENNA 8.6 MG PO TABS
1.0000 | ORAL_TABLET | Freq: Two times a day (BID) | ORAL | Status: DC
  Administered 2017-04-25 – 2017-04-28 (×6): 8.6 mg via ORAL
  Filled 2017-04-25 (×8): qty 1

## 2017-04-25 MED ORDER — LORATADINE 10 MG PO TABS
10.0000 mg | ORAL_TABLET | Freq: Every day | ORAL | Status: DC
  Administered 2017-04-25 – 2017-04-28 (×4): 10 mg via ORAL
  Filled 2017-04-25 (×4): qty 1

## 2017-04-25 MED ORDER — SODIUM CHLORIDE 0.9 % IV SOLN
Freq: Once | INTRAVENOUS | Status: AC
  Administered 2017-04-25: 17:00:00 via INTRAVENOUS

## 2017-04-25 MED ORDER — MORPHINE SULFATE (PF) 4 MG/ML IV SOLN
2.0000 mg | INTRAVENOUS | Status: DC | PRN
  Administered 2017-04-25 – 2017-04-28 (×8): 2 mg via INTRAVENOUS
  Filled 2017-04-25 (×8): qty 1

## 2017-04-25 MED ORDER — ONDANSETRON HCL 4 MG PO TABS
4.0000 mg | ORAL_TABLET | Freq: Four times a day (QID) | ORAL | Status: DC | PRN
Start: 2017-04-25 — End: 2017-04-25

## 2017-04-25 MED ORDER — LIDOCAINE HCL 1 % IJ SOLN
INTRAMUSCULAR | Status: AC
  Filled 2017-04-25: qty 20

## 2017-04-25 MED ORDER — PANTOPRAZOLE SODIUM 40 MG PO TBEC
40.0000 mg | DELAYED_RELEASE_TABLET | Freq: Two times a day (BID) | ORAL | Status: DC
  Administered 2017-04-25 – 2017-04-28 (×6): 40 mg via ORAL
  Filled 2017-04-25 (×6): qty 1

## 2017-04-25 MED ORDER — DEXTROSE-NACL 5-0.45 % IV SOLN
INTRAVENOUS | Status: DC
  Administered 2017-04-25: 20:00:00 via INTRAVENOUS

## 2017-04-25 MED ORDER — TRAZODONE HCL 50 MG PO TABS: 50.0000 mg | ORAL_TABLET | Freq: Every evening | ORAL | Status: DC | PRN

## 2017-04-25 MED ORDER — ONDANSETRON HCL 4 MG/2ML IJ SOLN: 4.0000 mg | Freq: Four times a day (QID) | INTRAMUSCULAR | Status: DC | PRN

## 2017-04-25 MED ORDER — ONDANSETRON HCL 4 MG PO TABS: 4.0000 mg | ORAL_TABLET | Freq: Four times a day (QID) | ORAL | Status: DC | PRN

## 2017-04-25 MED ORDER — POLYETHYLENE GLYCOL 3350 17 G PO PACK: 17.0000 g | PACK | Freq: Every day | ORAL | Status: DC | PRN

## 2017-04-25 MED ORDER — LIDOCAINE 5 % EX OINT
1.0000 "application " | TOPICAL_OINTMENT | Freq: Three times a day (TID) | CUTANEOUS | Status: DC
  Administered 2017-04-25 – 2017-04-28 (×9): 1 via TOPICAL
  Filled 2017-04-25: qty 35.44

## 2017-04-25 MED ORDER — ALPRAZOLAM 0.5 MG PO TABS: 1.0000 mg | ORAL_TABLET | Freq: Every evening | ORAL | Status: DC | PRN

## 2017-04-25 MED ORDER — OXYCODONE-ACETAMINOPHEN 10-325 MG PO TABS: 1.0000 | ORAL_TABLET | Freq: Four times a day (QID) | ORAL | Status: DC

## 2017-04-25 MED ORDER — SODIUM CHLORIDE 0.9 % IV SOLN
250.0000 mL | INTRAVENOUS | Status: DC | PRN
  Administered 2017-04-27: 250 mL via INTRAVENOUS

## 2017-04-25 MED ORDER — ONDANSETRON HCL 4 MG/2ML IJ SOLN: 4.0000 mg | Freq: Once | INTRAMUSCULAR | Status: DC | PRN

## 2017-04-25 MED ORDER — SODIUM CHLORIDE 0.9% FLUSH: 3.0000 mL | INTRAVENOUS | Status: DC | PRN

## 2017-04-25 MED ORDER — OXYMETAZOLINE HCL 0.05 % NA SOLN
2.0000 | Freq: Two times a day (BID) | NASAL | Status: DC | PRN
  Filled 2017-04-25: qty 15

## 2017-04-25 MED ORDER — ALBUTEROL SULFATE (2.5 MG/3ML) 0.083% IN NEBU: 2.5000 mg | INHALATION_SOLUTION | RESPIRATORY_TRACT | Status: DC | PRN

## 2017-04-25 MED ORDER — FUROSEMIDE 10 MG/ML IJ SOLN
20.0000 mg | Freq: Once | INTRAMUSCULAR | Status: AC
  Administered 2017-04-25: 20 mg via INTRAVENOUS
  Filled 2017-04-25: qty 2

## 2017-04-25 MED ORDER — CEFAZOLIN SODIUM-DEXTROSE 2-4 GM/100ML-% IV SOLN: 2.0000 g | INTRAVENOUS | Status: DC

## 2017-04-25 MED ORDER — POTASSIUM CHLORIDE CRYS ER 20 MEQ PO TBCR
40.0000 meq | EXTENDED_RELEASE_TABLET | Freq: Once | ORAL | Status: DC
Start: 2017-04-25 — End: 2017-04-28

## 2017-04-25 MED ORDER — OXYCODONE-ACETAMINOPHEN 5-325 MG PO TABS
1.0000 | ORAL_TABLET | Freq: Four times a day (QID) | ORAL | Status: DC
  Administered 2017-04-25 – 2017-04-28 (×11): 1 via ORAL
  Filled 2017-04-25 (×11): qty 1

## 2017-04-25 MED ORDER — NYSTATIN 100000 UNIT/GM EX CREA
1.0000 "application " | TOPICAL_CREAM | Freq: Every day | CUTANEOUS | Status: DC | PRN
  Filled 2017-04-25: qty 15

## 2017-04-25 MED ORDER — OXYCODONE HCL 5 MG PO TABS
5.0000 mg | ORAL_TABLET | Freq: Four times a day (QID) | ORAL | Status: DC
  Administered 2017-04-25 – 2017-04-28 (×12): 5 mg via ORAL
  Filled 2017-04-25 (×12): qty 1

## 2017-04-25 MED ORDER — ACETAMINOPHEN 650 MG RE SUPP
650.0000 mg | Freq: Four times a day (QID) | RECTAL | Status: DC | PRN
Start: 2017-04-25 — End: 2017-04-28

## 2017-04-25 MED ORDER — SODIUM CHLORIDE 0.9 % IV SOLN: INTRAVENOUS | Status: DC

## 2017-04-25 MED ORDER — POTASSIUM CHLORIDE CRYS ER 20 MEQ PO TBCR
40.0000 meq | EXTENDED_RELEASE_TABLET | Freq: Once | ORAL | Status: AC
  Administered 2017-04-25: 40 meq via ORAL
  Filled 2017-04-25: qty 2

## 2017-04-25 MED ORDER — QUETIAPINE FUMARATE 100 MG PO TABS
100.0000 mg | ORAL_TABLET | Freq: Every day | ORAL | Status: DC
  Administered 2017-04-25 – 2017-04-27 (×3): 100 mg via ORAL
  Filled 2017-04-25 (×4): qty 1

## 2017-04-25 MED ORDER — ACETAMINOPHEN 325 MG PO TABS
650.0000 mg | ORAL_TABLET | Freq: Four times a day (QID) | ORAL | Status: DC | PRN
Start: 2017-04-25 — End: 2017-04-28

## 2017-04-25 MED ORDER — SODIUM CHLORIDE 0.9% FLUSH
3.0000 mL | Freq: Two times a day (BID) | INTRAVENOUS | Status: DC
  Administered 2017-04-26: 3 mL via INTRAVENOUS

## 2017-04-25 MED ORDER — SUCRALFATE 1 GM/10ML PO SUSP
1.0000 g | Freq: Three times a day (TID) | ORAL | Status: DC
  Administered 2017-04-25 – 2017-04-28 (×9): 1 g via ORAL
  Filled 2017-04-25 (×12): qty 10

## 2017-04-25 MED ORDER — FENTANYL CITRATE (PF) 100 MCG/2ML IJ SOLN
50.0000 ug | Freq: Once | INTRAMUSCULAR | Status: AC
  Administered 2017-04-25: 50 ug via INTRAVENOUS
  Filled 2017-04-25: qty 2

## 2017-04-25 MED ORDER — IOPAMIDOL (ISOVUE-300) INJECTION 61%
INTRAVENOUS | Status: AC
  Filled 2017-04-25: qty 50

## 2017-04-25 NOTE — ED Notes (Signed)
Pt had labs drawn in short stay pta to ED

## 2017-04-25 NOTE — ED Notes (Signed)
Bedside commode at bedside.

## 2017-04-25 NOTE — ED Notes (Signed)
IV team at bedside 

## 2017-04-25 NOTE — H&P (Signed)
Chief Complaint: Patient was seen in consultation today for percutaneous gastric tube placement at the request of Martin,Matthew  Referring Physician(s): Martin,Matthew  Supervising Physician: Daryll Brod  Patient Status: [facility]  History of Present Illness: Angie [name] is a 51 y.o. female   Gastric bypass surgery 2007 Has had multiple anastomotic dilatations Previous gastric tube per Surgery 07/2016 was removed 4 weeks ago Wt loss Malnutrition  Now with pending reconstruction surgery with Dr Kieth Brightly Request replacement of Gastric tube for nutrition  Scheduled for today  Past Medical History:  Diagnosis Date  . Abnormal Pap smear   . Alcohol abuse   . Anemia    IDA  . Anxiety    Takes xanax  . Arthritis    "qwhere" (04/26/2016),   . Bipolar disorder ([place])   . Blood transfusion without reported diagnosis   . Bulging lumbar disc   . Chronic lower back pain   . Depression   . Diverticulitis   . Fibromyalgia   . Hypertension   . Lactose intolerance in adult 2017  . Migraines    "weekly @ least" (04/26/2016)  . Seizures ([place])    last one Dec. 2017  . Type II diabetes mellitus ([place])    "before the gastric bypass" (04/26/2016) states she no longer has diabetes    Past Surgical History:  Procedure Laterality Date  . BALLOON DILATION N/A 05/27/2016   Procedure: BALLOON DILATION;  Surgeon: Doran Stabler, MD;  Location: [facility];  Service: Gastroenterology;  Laterality: N/A;  . BALLOON DILATION N/A 04/21/2017   Procedure: BALLOON DILATION;  Surgeon: Doran Stabler, MD;  Location: [facility];  Service: Gastroenterology;  Laterality: N/A;  . CERVICAL CONE BIOPSY    . [place]; 1996; 1998; 2014  . CESAREAN SECTION WITH BILATERAL TUBAL LIGATION Bilateral 10/28/2012   Procedure: Repeat cesarean section with delivery of baby boy at 89. Apgars 8/9.  BILATERAL TUBAL LIGATION;  Surgeon: Florian Buff, MD;  Location: [facility];  Service:  Obstetrics;  Laterality: Bilateral;  . DILATION AND CURETTAGE OF UTERUS    . ESOPHAGOGASTRODUODENOSCOPY N/A 05/27/2016   Procedure: ESOPHAGOGASTRODUODENOSCOPY (EGD);  Surgeon: Doran Stabler, MD;  Location: [facility];  Service: Gastroenterology;  Laterality: N/A;  . ESOPHAGOGASTRODUODENOSCOPY (EGD) WITH PROPOFOL N/A 04/23/2016   Procedure: ESOPHAGOGASTRODUODENOSCOPY (EGD) WITH PROPOFOL;  Surgeon: Doran Stabler, MD;  Location: [facility];  Service: Endoscopy;  Laterality: N/A;  . ESOPHAGOGASTRODUODENOSCOPY (EGD) WITH PROPOFOL N/A 01/03/2017   Procedure: ESOPHAGOGASTRODUODENOSCOPY (EGD) WITH PROPOFOL;  Surgeon: Doran Stabler, MD;  [facility]: [facility];  Service: Gastroenterology;  Laterality: N/A;  . ESOPHAGOGASTRODUODENOSCOPY (EGD) WITH PROPOFOL N/A 04/21/2017   Procedure: ESOPHAGOGASTRODUODENOSCOPY (EGD) WITH PROPOFOL;  Surgeon: Doran Stabler, MD;  Location: [facility];  Service: Gastroenterology;  Laterality: N/A;  . GASTROSTOMY N/A 08/16/2016   Procedure: LAPAROSCOPIC INSERTION OF GASTROSTOMY TUBE IN REMNANT STOMACH;  Surgeon: Arta Bruce Kinsinger, MD;  Location: [facility];  Service: General;  Laterality: N/A;  . ROUX-EN-Y GASTRIC BYPASS  2007  . TUBAL LIGATION  2014    Allergies: Iron  Medications: Prior to Admission medications   Medication Sig Start Date End Date Taking? Authorizing Provider  ALPRAZolam Duanne Moron) 1 MG tablet Take 1 tablet (1 mg total) by mouth at bedtime as needed for anxiety. Patient taking differently: Take 1 mg by mouth 3 (three) times daily.  08/27/16   Lauree Chandler, NP  calcium carbonate (TUMS - DOSED IN  MG ELEMENTAL CALCIUM) 500 MG chewable tablet Chew 2 tablets by mouth daily as needed for indigestion or heartburn.    [provider]  cyclobenzaprine (FLEXERIL) 5 MG tablet Take 1 tablet (5 mg total) by mouth 3 (three) times daily as needed for muscle spasms. Patient not taking: Reported on 03/31/2017 08/20/16   Kinsinger, Arta Bruce,  MD  fexofenadine (ALLEGRA) 180 MG tablet Take 180 mg by mouth daily as needed for allergies.     [provider]  lidocaine (XYLOCAINE) 5 % ointment Apply 1 application topically 3 (three) times daily. Apply to Bilateral shoulders 05/11/16   [provider]  nystatin cream (MYCOSTATIN) Apply 1 application topically daily as needed for wound care. 01/21/17   [provider]  ondansetron (ZOFRAN) 4 MG tablet Take 4 mg by mouth every 6 (six) hours as needed for nausea or vomiting.    [provider]  oxyCODONE-acetaminophen (PERCOCET) 10-325 MG tablet Take 1 tablet by mouth 4 (four) times daily.    [provider]  pregabalin (LYRICA) 50 MG capsule Take 50 mg by mouth 3 (three) times daily.    [provider]  QUEtiapine (SEROQUEL) 100 MG tablet Take 1 tablet (100 mg total) by mouth at bedtime. 07/25/16 03/31/17  Roxan Hockey, MD     Family History  Problem Relation Age of Onset  . Diabetes Father   . Heart disease Father   . Depression Maternal Grandmother   . Heart disease Maternal Grandfather   . Depression Paternal Grandmother   . Colon cancer Paternal Grandfather   . Pancreatic cancer Paternal Grandfather     Social History   Socioeconomic History  . Marital status: Widowed    Spouse name: None  . Number of children: 4  . Years of education: None  . Highest education level: None  Social Needs  . Financial resource strain: None  . Food insecurity - worry: None  . Food insecurity - inability: None  . Transportation needs - medical: None  . Transportation needs - non-medical: None  Occupational History  . None  Tobacco Use  . Smoking status: Former Smoker    Packs/day: 0.50    Years: 4.00    Pack years: 2.00    Types: Cigarettes    Last attempt to quit: 03/16/2012    Years since quitting: 5.1  . Smokeless tobacco: Never Used  Substance and Sexual Activity  . Alcohol use: Yes    Alcohol/week: 10.2 oz    Types: 6  Glasses of wine, 11 Shots of liquor per week    Comment: current;y in treatment for alcohol abuse; no use at this time  . Drug use: No  . Sexual activity: Yes    Birth control/protection: None  Other Topics Concern  . None  Social History Narrative  . None    Review of Systems: A 12 point ROS discussed and pertinent positives are indicated in the HPI above.  All other systems are negative.  Review of Systems  Constitutional: Positive for appetite change, fatigue and unexpected weight change.  Respiratory: Negative for cough and shortness of breath.   Cardiovascular: Negative for chest pain.  Gastrointestinal: Negative for abdominal pain.  Neurological: Positive for weakness.  Psychiatric/Behavioral: Negative for behavioral problems and confusion.    Vital Signs: BP 135/90 (BP Location: Right Arm)   Pulse 88   Temp 97.7 F (36.5 C) (Oral)   Ht 5' (1.524 m)   Wt 100 lb (45.4 kg)   LMP 03/25/2017  SpO2 98%   BMI 19.53 kg/m   Physical Exam  Constitutional: She is oriented to person, place, and time.  Cardiovascular: Normal rate, regular rhythm and normal heart sounds.  Pulmonary/Chest: Effort normal and breath sounds normal.  Abdominal: Soft. Bowel sounds are normal.  Site of previous G tube is clean and dry Well healed  Musculoskeletal: Normal range of motion.  Neurological: She is alert and oriented to person, place, and time.  Skin: Skin is warm.  Psychiatric: She has a normal mood and affect. Her behavior is normal. Judgment and thought content normal.  Nursing note and vitals reviewed.   Imaging: No results found.  Labs:  CBC: Recent Labs    08/16/16 1840 08/17/16 0702 08/18/16 0458 08/19/16 0400  WBC 7.3 5.6 2.8* 4.2  HGB 8.6* 8.4* 7.5* 7.7*  HCT 26.0* 24.8* 23.8* 24.2*  PLT 339 293 296 287    COAGS: Recent Labs    08/12/16 1330  INR 0.89  APTT 29    BMP: Recent Labs    08/12/16 1330 08/16/16 1840 08/17/16 0702 08/18/16 0458  08/19/16 0400  NA 138  --  138 137 136  K 5.4*  --  3.8 3.8 4.0  CL 110  --  109 112* 113*  CO2 23  --  23 18* 18*  GLUCOSE 96  --  91 167* 139*  BUN 10  --  12 8 11   CALCIUM 9.3  --  8.2* 7.8* 8.3*  CREATININE 0.61 0.66 0.53 0.52 0.46  GFRNONAA >60 >60 >60 >60 >60  GFRAA >60 >60 >60 >60 >60    LIVER FUNCTION TESTS: Recent Labs    07/21/16 1429 07/22/16 0407 08/12/16 1330 08/19/16 0400 12/29/16 1418  BILITOT 0.5 0.4 0.2* 0.1*  --   AST 15 21 36 24  --   ALT 12* 11* 33 18  --   ALKPHOS 80 59 66 64  --   PROT 7.9 6.2* 7.2 6.1*  --   ALBUMIN 4.2 3.2* 4.1 3.1* 3.7    TUMOR MARKERS: No results for input(s): AFPTM, CEA, CA199, CHROMGRNA in the last 8760 hours.  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 All of the patient's questions were answered, patient is agreeable to proceed. Consent signed and in chart.   Thank you for this interesting consult.  I greatly enjoyed meeting Angie [name] and look forward to participating in their care.  A copy of this report was sent to the requesting provider on this date.  Electronically Signed: Lavonia Drafts, PA-C 04/25/2017, 8:08 AM   I spent a total of  30 Minutes   in face to face in clinical consultation, greater than 50% of which was counseling/coordinating care for percutaneous gastric tube placement

## 2017-04-25 NOTE — H&P (Signed)
Patient Demographics:    Angie [name], is a 51 y.o. female  MRN: [id number]   DOB - 1966/02/04  Admit Date - 04/25/2017  Outpatient Primary MD for the patient is Ricke Hey, MD   Assessment & Plan:    Principal Problem:   Symptomatic Anemia Active Problems:   S/P gastric bypass   Protein-calorie malnutrition, severe   Chronic pain syndrome   Iron deficiency anemia   Dysphagia   Abnormal loss of weight   Symptomatic anemia    1)Symptomatic Acute on Chronic Anemia- Hgb is down to 6.2, patient is dizzy, with significant fatigue and dyspnea on exertion transfuse 2 units of packed cells with Lasix in between, no evidence of acute bleeding at this time, please see recent EGD report dated 04/21/2017.  Patient is status post prior gastric bypass suspect some degree of B12 and folate deficiency.   2)FEN/Hypokalemia- replace potassium, patient is now able to tolerate some oral intake after EGD with dilatation on 04/21/2017 however caloric intake is not enough to allow for reconstructive surgery, Now with pending reconstruction surgery with Dr Kieth Brightly she would need replacement of Gastric tube for nutrition.  Possible G-tube placement once anemia is corrected.  PPI and Carafate as ordered  3)Social/Ethics- Plan of care and CODE STATUS discussed with patient she is a full code  4)Bipolar Disorder-stable at this time, resume Seroquel,   With History of - Reviewed by me  Past Medical History:  Diagnosis Date  . Abnormal Pap smear   . Alcohol abuse   . Anemia    IDA  . Anxiety    Takes xanax  . Arthritis    "qwhere" (04/26/2016),   . Bipolar disorder ([place])   . Blood transfusion without reported diagnosis   . Bulging lumbar disc   . Chronic lower back pain   . Depression   . Diverticulitis   .  Fibromyalgia   . Hypertension   . Lactose intolerance in adult 2017  . Migraines    "weekly @ least" (04/26/2016)  . Seizures ([place])    last one Dec. 2017  . Type II diabetes mellitus ([place])    "before the gastric bypass" (04/26/2016) states she no longer has diabetes      Past Surgical History:  Procedure Laterality Date  . BALLOON DILATION N/A 05/27/2016   Procedure: BALLOON DILATION;  Surgeon: Doran Stabler, MD;  Location: [facility];  Service: Gastroenterology;  Laterality: N/A;  . BALLOON DILATION N/A 04/21/2017   Procedure: BALLOON DILATION;  Surgeon: Doran Stabler, MD;  Location: [facility];  Service: Gastroenterology;  Laterality: N/A;  . CERVICAL CONE BIOPSY    . [place]; 1996; 1998; 2014  . CESAREAN SECTION WITH BILATERAL TUBAL LIGATION Bilateral 10/28/2012   Procedure: Repeat cesarean section with delivery of baby boy at 30. Apgars 8/9.  BILATERAL TUBAL LIGATION;  Surgeon: Florian Buff, MD;  Location: [facility];  Service: Obstetrics;  Laterality: Bilateral;  . DILATION AND CURETTAGE OF UTERUS    . ESOPHAGOGASTRODUODENOSCOPY N/A 05/27/2016   Procedure: ESOPHAGOGASTRODUODENOSCOPY (EGD);  Surgeon: Doran Stabler, MD;  Location: [facility];  Service: Gastroenterology;  Laterality: N/A;  . ESOPHAGOGASTRODUODENOSCOPY (EGD) WITH PROPOFOL N/A 04/23/2016   Procedure: ESOPHAGOGASTRODUODENOSCOPY (EGD) WITH PROPOFOL;  Surgeon: Doran Stabler, MD;  Location: [facility];  Service: Endoscopy;  Laterality: N/A;  . ESOPHAGOGASTRODUODENOSCOPY (EGD) WITH PROPOFOL N/A 01/03/2017   Procedure: ESOPHAGOGASTRODUODENOSCOPY (EGD) WITH PROPOFOL;  Surgeon: Doran Stabler, MD;  Location: [facility];  Service: Gastroenterology;  Laterality: N/A;  . ESOPHAGOGASTRODUODENOSCOPY (EGD) WITH PROPOFOL N/A 04/21/2017   Procedure: ESOPHAGOGASTRODUODENOSCOPY (EGD) WITH PROPOFOL;  Surgeon: Doran Stabler, MD;  Location: [facility];  Service: Gastroenterology;  Laterality: N/A;  .  GASTROSTOMY N/A 08/16/2016   Procedure: LAPAROSCOPIC INSERTION OF GASTROSTOMY TUBE IN REMNANT STOMACH;  Surgeon: Arta Bruce Kinsinger, MD;  Location: [facility];  Service: General;  Laterality: N/A;  . ROUX-EN-Y GASTRIC BYPASS  2007  . TUBAL LIGATION  2014      Chief Complaint  Patient presents with  . Abnormal Lab      HPI:    Rojean [name]  is a 51 y.o. female Has a history of esophageal stricture status post dilation multiple times but has severe malnutrition secondary to continued strictures causing Dysphagia from GBP anastomotic stricture patient underwent EGD with balloon dilatation on 04/21/2017 due to  gastrojejunal anastamosis stricture , initially plan was for Referal back to Dr. Gayleen Orem at [facility] for [facility] placement in hopes of avoiding revision surgery, however at this time patient has undecided to undergo revision surgery instead.  To allow for revision surgery patient was told to have G-tube replaced to improve nutritional status prior to surgery. She was actually scheduled to get a G-tube placed on 04/25/17 and was found to have a hemoglobin 6.6 rechecked with sent here for transfusion.  Patient does state that she has weakness and shortness of breath.  No bloody stools. No other associated or modifying symptoms.   No chest pains no palpitations, patient does complain of shortness of breath, dyspnea on exertion and generalized weakness and fatigue no change in stool habits  Gastric bypass surgery 2007 Has had multiple anastomotic dilatations Previous gastric tube per Surgery 07/2016 was removed 4 weeks ago Wt loss and Malnutrition  Now with pending reconstruction surgery with Dr Kieth Brightly Request replacement of Gastric tube for nutrition     Review of systems:    In addition to the HPI above,   A full 12 point Review of 10 Systems was done, except as stated above, all other Review of 10 Systems were negative.    Social History:  Reviewed by me    Social History    Tobacco Use  . Smoking status: Former Smoker    Packs/day: 0.50    Years: 4.00    Pack years: 2.00    Types: Cigarettes    Last attempt to quit: 03/16/2012    Years since quitting: 5.1  . Smokeless tobacco: Never Used  Substance Use Topics  . Alcohol use: Yes    Alcohol/week: 10.2 oz    Types: 6 Glasses of wine, 11 Shots of liquor per week    Comment: current;y in treatment for alcohol abuse; no use at this time       Family History :  Reviewed by me    Family History  Problem Relation Age of Onset  . Diabetes Father   .  Heart disease Father   . Depression Maternal Grandmother   . Heart disease Maternal Grandfather   . Depression Paternal Grandmother   . Colon cancer Paternal Grandfather   . Pancreatic cancer Paternal Grandfather     Home Medications:   Prior to Admission medications   Medication Sig Start Date End Date Taking? Authorizing Provider  ALPRAZolam Duanne Moron) 1 MG tablet Take 1 tablet (1 mg total) by mouth at bedtime as needed for anxiety. Patient taking differently: Take 1 mg by mouth 3 (three) times daily.  08/27/16  Yes Eubanks, Carlos American, NP  CARAFATE 1 GM/10ML suspension Take 10 mLs by mouth 3 (three) times daily. Before meals 04/07/17  Yes [provider]  fexofenadine (ALLEGRA) 180 MG tablet Take 180 mg by mouth daily as needed for allergies.    Yes [provider]  lidocaine (XYLOCAINE) 5 % ointment Apply 1 application topically 3 (three) times daily. Apply to Bilateral shoulders 05/11/16  Yes [provider]  nystatin cream (MYCOSTATIN) Apply 1 application topically daily as needed for wound care. 01/21/17  Yes [provider]  ondansetron (ZOFRAN) 4 MG tablet Take 4 mg by mouth every 6 (six) hours as needed for nausea or vomiting.   Yes [provider]  oxyCODONE-acetaminophen (PERCOCET) 10-325 MG tablet Take 1 tablet by mouth 4 (four) times daily.   Yes [provider]  oxymetazoline (AFRIN) 0.05 %  nasal spray Place 2 sprays into both nostrils 2 (two) times daily as needed for congestion.   Yes [provider]  pregabalin (LYRICA) 50 MG capsule Take 50 mg by mouth 3 (three) times daily.   Yes [provider]  QUEtiapine (SEROQUEL) 100 MG tablet Take 1 tablet (100 mg total) by mouth at bedtime. 07/25/16 04/25/17 Yes Caytlin Better, MD  cyclobenzaprine (FLEXERIL) 5 MG tablet Take 1 tablet (5 mg total) by mouth 3 (three) times daily as needed for muscle spasms. Patient not taking: Reported on 03/31/2017 08/20/16   Kinsinger, Arta Bruce, MD     Allergies:     Allergies  Allergen Reactions  . Iron     Had acute allergy with tachycardia, attention and generalized itching shortly after receiving nulecit ( iron gluconate) infusion.     Physical Exam:   Vitals  Blood pressure (!) 148/101, pulse 86, temperature 98.4 F (36.9 C), temperature source Oral, resp. rate 14, SpO2 100 %.  Physical Examination: General appearance - cachectic and chronically ill-appearing Mental status - alert, oriented to person, place, and time,  Eyes - sclera anicteric Neck - supple, no JVD elevation , Chest - clear  to auscultation bilaterally, symmetrical air movement,  Heart - S1 and S2 normal,  Abdomen - soft, nontender, nondistended, healed Peg tube site Neurological - generalized weakness without new focal deficits Extremities - no pedal edema noted, intact peripheral pulses  Skin - warm, dry Psych- appropriate affect   Data Review:    CBC Recent Labs  Lab 04/25/17 0750 04/25/17 0925  WBC 3.7* 2.8*  HGB 6.6* 6.2*  HCT 20.8* 19.7*  PLT 416* 348  MCV 71.5* 71.4*  MCH 22.7* 22.5*  MCHC 31.7 31.5  RDW 19.5* 19.5*   ------------------------------------------------------------------------------------------------------------------  Chemistries  Recent Labs  Lab 04/25/17 0750  NA 135  K 2.9*  CL 103  CO2 21*  GLUCOSE 57*  BUN 9  CREATININE 0.74  CALCIUM 8.2*    ------------------------------------------------------------------------------------------------------------------ estimated creatinine clearance is 60.3 mL/min (by C-G formula based on SCr of 0.74 mg/dL). ------------------------------------------------------------------------------------------------------------------ No results  for input(s): TSH, T4TOTAL, T3FREE, THYROIDAB in the last 72 hours.  Invalid input(s): FREET3   Coagulation profile Recent Labs  Lab 04/25/17 0750  INR 1.00   ------------------------------------------------------------------------------------------------------------------- No results for input(s): DDIMER in the last 72 hours. -------------------------------------------------------------------------------------------------------------------  Cardiac Enzymes No results for input(s): CKMB, TROPONINI, MYOGLOBIN in the last 168 hours.  Invalid input(s): CK ------------------------------------------------------------------------------------------------------------------ No results found for: BNP   ---------------------------------------------------------------------------------------------------------------  Urinalysis    Component Value Date/Time   COLORURINE COLORLESS (A) 04/22/2016 0038   APPEARANCEUR CLEAR 04/22/2016 0038   LABSPEC 1.004 (L) 04/22/2016 0038   PHURINE 7.0 04/22/2016 0038   GLUCOSEU 50 (A) 04/22/2016 0038   HGBUR NEGATIVE 04/22/2016 0038   BILIRUBINUR NEGATIVE 04/22/2016 0038   KETONESUR NEGATIVE 04/22/2016 0038   PROTEINUR NEGATIVE 04/22/2016 0038   UROBILINOGEN 0.2 10/26/2012 1115   NITRITE NEGATIVE 04/22/2016 0038   LEUKOCYTESUR NEGATIVE 04/22/2016 0038    ----------------------------------------------------------------------------------------------------------------   Imaging Results:    No results found.  Radiological Exams on Admission: No results found.  DVT Prophylaxis -SCD/ AM Labs Ordered, also please review  Full Orders  Family Communication: Admission, patients condition and plan of care including tests being ordered have been discussed with the patient who indicate understanding and agree with the plan   Code Status - Full Code  Likely DC to  home  Condition   stable  Roxan Hockey M.D on 04/25/2017 at 5:47 PM   Between 7am to 7pm - Pager - [phone] After 7pm go to [email or web address] - password [email or web address]  [facility] - Office  [phone]  Voice Recognition Viviann Spare dictation system was used to create this note, attempts have been made to correct errors. Please contact the author with questions and/or clarifications.

## 2017-04-25 NOTE — ED Notes (Signed)
EMERGENCY DEPARTMENT  US GUIDANCE EXAM Emergency Ultrasound:  US Guidance for Needle Guidance  INDICATIONS: Difficult vascular access Linear probe used in real-time to visualize location of needle entry through skin.   PERFORMED BY: Myself IMAGES ARCHIVED?: no LIMITATIONS: none VIEWS USED: Transverse INTERPRETATION: Needle visualized within vein   Tolerated well w/no complications.   Aaron Edelman RN  5:16 PM 04/25/17

## 2017-04-25 NOTE — ED Triage Notes (Signed)
Pt reports she was supposed to have a g tube placed today but was told her hemoglobin was low and to come to the ED. Pt reports excessive fatigue, denies pain or any blood in stools. resp e/u, nad.

## 2017-04-25 NOTE — ED Provider Notes (Addendum)
[facility] EMERGENCY DEPARTMENT Provider Note   CSN: [id number] Arrival date & time: 04/25/17  1037     History   Chief Complaint Chief Complaint  Patient presents with  . Abnormal Lab    HPI Angie [name] is a 51 y.o. female.   Abnormal Lab  Time since result:  Earlier today Patient referred by:  Specialist Resulting agency:  Internal Result type: hematology   Hematology:    Hemoglobin:  Low  Has a history of esophageal stricture status post dilation multiple times but has severe malnutrition secondary to continued strictures.  Was actually scheduled to get a G-tube today was found to have a hemoglobin 6.6 rechecked with sent here for transfusion.  Patient does state that she has weakness and shortness of breath.  No bloody stools. No other associated or modifying symptoms.   Past Medical History:  Diagnosis Date  . Abnormal Pap smear   . Alcohol abuse   . Anemia    IDA  . Anxiety    Takes xanax  . Arthritis    "qwhere" (04/26/2016),   . Bipolar disorder ([place])   . Blood transfusion without reported diagnosis   . Bulging lumbar disc   . Chronic lower back pain   . Depression   . Diverticulitis   . Fibromyalgia   . Hypertension   . Lactose intolerance in adult 2017  . Migraines    "weekly @ least" (04/26/2016)  . Seizures ([place])    last one Dec. 2017  . Type II diabetes mellitus ([place])    "before the gastric bypass" (04/26/2016) states she no longer has diabetes    Patient Active Problem List   Diagnosis Date Noted  . Abnormal loss of weight   . Dysphagia   . S/P percutaneous endoscopic gastrostomy (PEG) tube placement ([place]) 09/06/2016  . At risk for adverse drug event 08/25/2016  . Malnutrition following gastrointestinal surgery 08/16/2016  . Anastomotic ulcer 07/21/2016  . Transient alteration of awareness   . Alcohol use 04/27/2016  . Chronic pain syndrome 04/27/2016  . Iron deficiency anemia 04/27/2016  . B12 deficiency 04/27/2016    . Gastrojejunal anastomotic stricture   . Hypoglycemia 04/22/2016  . Syncope 04/22/2016  . Protein-calorie malnutrition, severe 04/22/2016  . Nausea and vomiting   . Abdominal pain, chronic, epigastric   . Hypotension 02/01/2016  . Hyponatremia 02/01/2016  . Subacromial impingement of left shoulder 07/06/2013  . Trochanteric bursitis of left hip 05/11/2013  . Arthritis 05/16/2012  . S/P gastric bypass 05/16/2012    Past Surgical History:  Procedure Laterality Date  . BALLOON DILATION N/A 05/27/2016   Procedure: BALLOON DILATION;  Surgeon: Doran Stabler, MD;  Location: [facility];  Service: Gastroenterology;  Laterality: N/A;  . BALLOON DILATION N/A 04/21/2017   Procedure: BALLOON DILATION;  Surgeon: Doran Stabler, MD;  Location: [facility];  Service: Gastroenterology;  Laterality: N/A;  . CERVICAL CONE BIOPSY    . [place]; 1996; 1998; 2014  . CESAREAN SECTION WITH BILATERAL TUBAL LIGATION Bilateral 10/28/2012   Procedure: Repeat cesarean section with delivery of baby boy at 31. Apgars 8/9.  BILATERAL TUBAL LIGATION;  Surgeon: Florian Buff, MD;  Location: [facility];  Service: Obstetrics;  Laterality: Bilateral;  . DILATION AND CURETTAGE OF UTERUS    . ESOPHAGOGASTRODUODENOSCOPY N/A 05/27/2016   Procedure: ESOPHAGOGASTRODUODENOSCOPY (EGD);  Surgeon: Doran Stabler, MD;  Location: [facility];  Service: Gastroenterology;  Laterality: N/A;  . ESOPHAGOGASTRODUODENOSCOPY (  EGD) WITH PROPOFOL N/A 04/23/2016   Procedure: ESOPHAGOGASTRODUODENOSCOPY (EGD) WITH PROPOFOL;  Surgeon: Doran Stabler, MD;  Location: [facility];  Service: Endoscopy;  Laterality: N/A;  . ESOPHAGOGASTRODUODENOSCOPY (EGD) WITH PROPOFOL N/A 01/03/2017   Procedure: ESOPHAGOGASTRODUODENOSCOPY (EGD) WITH PROPOFOL;  Surgeon: Doran Stabler, MD;  Location: [facility];  Service: Gastroenterology;  Laterality: N/A;  . ESOPHAGOGASTRODUODENOSCOPY (EGD) WITH PROPOFOL N/A 04/21/2017   Procedure:  ESOPHAGOGASTRODUODENOSCOPY (EGD) WITH PROPOFOL;  Surgeon: Doran Stabler, MD;  Location: [place];  Service: Gastroenterology;  Laterality: N/A;  . GASTROSTOMY N/A 08/16/2016   Procedure: LAPAROSCOPIC INSERTION OF GASTROSTOMY TUBE IN REMNANT STOMACH;  Surgeon: Arta Bruce Kinsinger, MD;  Location: [facility];  Service: General;  Laterality: N/A;  . ROUX-EN-Y GASTRIC BYPASS  2007  . TUBAL LIGATION  2014    OB History    Gravida Para Term Preterm AB Living   6 4 4  0 2 4   SAB TAB Ectopic Multiple Live Births   2       4       Home Medications    Prior to Admission medications   Medication Sig Start Date End Date Taking? Authorizing Provider  ALPRAZolam Duanne Moron) 1 MG tablet Take 1 tablet (1 mg total) by mouth at bedtime as needed for anxiety. Patient taking differently: Take 1 mg by mouth 3 (three) times daily.  08/27/16   Lauree Chandler, NP  calcium carbonate (TUMS - DOSED IN MG ELEMENTAL CALCIUM) 500 MG chewable tablet Chew 2 tablets by mouth daily as needed for indigestion or heartburn.    [provider]  cyclobenzaprine (FLEXERIL) 5 MG tablet Take 1 tablet (5 mg total) by mouth 3 (three) times daily as needed for muscle spasms. Patient not taking: Reported on 03/31/2017 08/20/16   Kinsinger, Arta Bruce, MD  fexofenadine (ALLEGRA) 180 MG tablet Take 180 mg by mouth daily as needed for allergies.     [provider]  lidocaine (XYLOCAINE) 5 % ointment Apply 1 application topically 3 (three) times daily. Apply to Bilateral shoulders 05/11/16   [provider]  nystatin cream (MYCOSTATIN) Apply 1 application topically daily as needed for wound care. 01/21/17   [provider]  ondansetron (ZOFRAN) 4 MG tablet Take 4 mg by mouth every 6 (six) hours as needed for nausea or vomiting.    [provider]  oxyCODONE-acetaminophen (PERCOCET) 10-325 MG tablet Take 1 tablet by mouth 4 (four) times daily.    [provider]  pregabalin (LYRICA)  50 MG capsule Take 50 mg by mouth 3 (three) times daily.    [provider]  QUEtiapine (SEROQUEL) 100 MG tablet Take 1 tablet (100 mg total) by mouth at bedtime. 07/25/16 04/25/17  Roxan Hockey, MD    Family History Family History  Problem Relation Age of Onset  . Diabetes Father   . Heart disease Father   . Depression Maternal Grandmother   . Heart disease Maternal Grandfather   . Depression Paternal Grandmother   . Colon cancer Paternal Grandfather   . Pancreatic cancer Paternal Grandfather     Social History Social History   Tobacco Use  . Smoking status: Former Smoker    Packs/day: 0.50    Years: 4.00    Pack years: 2.00    Types: Cigarettes    Last attempt to quit: 03/16/2012    Years since quitting: 5.1  . Smokeless tobacco: Never Used  Substance Use Topics  . Alcohol use: Yes  Alcohol/week: 10.2 oz    Types: 6 Glasses of wine, 11 Shots of liquor per week    Comment: current;y in treatment for alcohol abuse; no use at this time  . Drug use: No     Allergies   Iron   Review of Systems Review of Systems  All other systems reviewed and are negative.    Physical Exam Updated Vital Signs BP 115/90   Pulse 77   Temp 98.2 F (36.8 C) (Oral)   Resp 16   SpO2 100%   Physical Exam  Constitutional: She is oriented to person, place, and time. She appears well-developed and well-nourished.  HENT:  Head: Normocephalic and atraumatic.  Eyes: Conjunctivae and EOM are normal.  Neck: Normal range of motion.  Cardiovascular: Normal rate and regular rhythm.  Pulmonary/Chest: Effort normal. No stridor. No respiratory distress.  Abdominal: She exhibits no distension.  Musculoskeletal: Normal range of motion. She exhibits no edema or deformity.  Neurological: She is alert and oriented to person, place, and time. No cranial nerve deficit. Coordination normal.  Skin: Skin is warm and dry. No erythema. No pallor.  Nursing note and vitals  reviewed.    ED Treatments / Results  Labs (all labs ordered are listed, but only abnormal results are displayed) Labs Reviewed  TYPE AND SCREEN  PREPARE RBC (CROSSMATCH)    EKG  EKG Interpretation None       Radiology No results found.  Procedures Procedures (including critical care time)  Medications Ordered in ED Medications  0.9 %  sodium chloride infusion (not administered)  fentaNYL (SUBLIMAZE) injection 50 mcg (50 mcg Intravenous Given 04/25/17 1354)     Initial Impression / Assessment and Plan / ED Course  I have reviewed the triage vital signs and the nursing notes.  Pertinent labs & imaging results that were available during my care of the patient were reviewed by me and considered in my medical decision making (see chart for details).     Symptomatic anemia likely 2/2 malnutrition. Verified. Will admit for same.   Final Clinical Impressions(s) / ED Diagnoses   Final diagnoses:  Symptomatic anemia      Dameer Speiser, Corene Cornea, MD 04/25/17 1426    Dessirae Scarola, Corene Cornea, MD 04/25/17 1427

## 2017-04-25 NOTE — Progress Notes (Signed)
Patient ID: Angie [name], female   DOB: [birth year], 51 y.o.   MRN: [id number]   Pt is scheduled today for percutaneous gastric tube placement  After review of imaging Dr Annamaria Boots has deemed this pts anatomy not conducive to percutaneous placement Rec: surgical placement Of note; Hg 6.6 today  I have informed pt I have informed Dr Kieth Brightly office RN  Pt will hear from Dr Kieth Brightly office for further plan.

## 2017-04-25 NOTE — ED Notes (Signed)
Three unsuccessful blood draws. Patient difficult stick.  Unable to draw blood for HIV test.

## 2017-04-25 NOTE — ED Notes (Addendum)
When beginning blood administration, iv would not flush. Will attempt second IV.

## 2017-04-25 NOTE — ED Notes (Addendum)
Patient declined CBG.  States she no longer has diabetes since gastric bypass and weight loss.

## 2017-04-25 NOTE — Progress Notes (Signed)
Lab called with abnormal CBC. Called to [facility] [place],PA who requested CBC be repeated for verification. IR notified of delay

## 2017-04-25 NOTE — ED Notes (Signed)
Eating dinner.  Food at bedside.

## 2017-04-25 NOTE — Progress Notes (Deleted)
Patient ID: Angie [name], female   DOB: 01/10/1966, 51 y.o.   MRN: [id number]   Hg now 6.2 Pt feeling very weak  Plan to send to ED for evaluation and possible transfusion.  Pt is agreeable

## 2017-04-25 NOTE — Progress Notes (Signed)
Patient ID: Angie [name], female   DOB: Sep 18, 1965, 51 y.o.   MRN: [id number]   Pt now with Hg 6.2  Feeling weak and tired  Will send to ED for evaluation and poss transfusion  She is agreeable

## 2017-04-25 NOTE — ED Notes (Signed)
Called blood bank to due to type and screen has resulted but never receiving a call stating that blood was ready. Blood bank states blood is ready for transfusion.

## 2017-04-25 NOTE — Progress Notes (Signed)
Received lab abnormal result, HGB 6.2, results given to Jannifer Franklin PA. Pt to be Dc.

## 2017-04-26 ENCOUNTER — Other Ambulatory Visit: Payer: Self-pay

## 2017-04-26 ENCOUNTER — Encounter (HOSPITAL_COMMUNITY): Payer: Self-pay | Admitting: General Practice

## 2017-04-26 LAB — CBC
HCT: 30.4 % — ABNORMAL LOW (ref 36.0–46.0)
HCT: 30.4 % — ABNORMAL LOW (ref 36.0–46.0)
Hemoglobin: 9.7 g/dL — ABNORMAL LOW (ref 12.0–15.0)
Hemoglobin: 9.8 g/dL — ABNORMAL LOW (ref 12.0–15.0)
MCH: 24.3 pg — ABNORMAL LOW (ref 26.0–34.0)
MCH: 24.3 pg — ABNORMAL LOW (ref 26.0–34.0)
MCHC: 31.9 g/dL (ref 30.0–36.0)
MCHC: 32.2 g/dL (ref 30.0–36.0)
MCV: 76.2 fL — ABNORMAL LOW (ref 78.0–100.0)
MCV: 75.4 fL — ABNORMAL LOW (ref 78.0–100.0)
Platelets: 369 10*3/uL (ref 150–400)
Platelets: 336 10*3/uL (ref 150–400)
RBC: 3.99 MIL/uL (ref 3.87–5.11)
RBC: 4.03 MIL/uL (ref 3.87–5.11)
RDW: 19.2 % — ABNORMAL HIGH (ref 11.5–15.5)
RDW: 18.9 % — ABNORMAL HIGH (ref 11.5–15.5)
WBC: 4.3 10*3/uL (ref 4.0–10.5)
WBC: 3.8 10*3/uL — ABNORMAL LOW (ref 4.0–10.5)

## 2017-04-26 LAB — BPAM RBC
Blood Product Expiration Date: 201901052359
Blood Product Expiration Date: 201901252359
ISSUE DATE / TIME: 201812311647
ISSUE DATE / TIME: 201812312038
Unit Type and Rh: 1700
Unit Type and Rh: 7300

## 2017-04-26 LAB — TYPE AND SCREEN
ABO/RH(D): B POS
Antibody Screen: NEGATIVE
Unit division: 0
Unit division: 0

## 2017-04-26 LAB — BASIC METABOLIC PANEL
Anion gap: 13 (ref 5–15)
BUN: 6 mg/dL (ref 6–20)
CO2: 22 mmol/L (ref 22–32)
Calcium: 8.1 mg/dL — ABNORMAL LOW (ref 8.9–10.3)
Chloride: 100 mmol/L — ABNORMAL LOW (ref 101–111)
Creatinine, Ser: 0.75 mg/dL (ref 0.44–1.00)
GFR calc Af Amer: >60 mL/min (ref >60)
GFR calc non Af Amer: >60 mL/min (ref >60)
Glucose, Bld: 73 mg/dL (ref 65–99)
Potassium: 3.2 mmol/L — ABNORMAL LOW (ref 3.5–5.1)
Sodium: 135 mmol/L (ref 135–145)

## 2017-04-26 LAB — CBG MONITORING, ED: Glucose-Capillary: 111 mg/dL — ABNORMAL HIGH (ref 65–99)

## 2017-04-26 LAB — MAGNESIUM: Magnesium: 1.4 mg/dL — ABNORMAL LOW (ref 1.7–2.4)

## 2017-04-26 LAB — VITAMIN B12: Vitamin B-12: 406 pg/mL (ref 180–914)

## 2017-04-26 LAB — FOLATE: Folate: 12.9 ng/mL (ref >5.9)

## 2017-04-26 LAB — PREALBUMIN: Prealbumin: 9.9 mg/dL — ABNORMAL LOW (ref 18–38)

## 2017-04-26 MED ORDER — PROSIGHT PO TABS
1.0000 | ORAL_TABLET | Freq: Every day | ORAL | Status: DC
  Administered 2017-04-26 – 2017-04-28 (×3): 1 via ORAL
  Filled 2017-04-26 (×3): qty 1

## 2017-04-26 MED ORDER — POTASSIUM CHLORIDE 2 MEQ/ML IV SOLN
INTRAVENOUS | Status: DC
  Administered 2017-04-26 (×2): via INTRAVENOUS
  Filled 2017-04-26 (×3): qty 1000

## 2017-04-26 MED ORDER — MAGNESIUM SULFATE 2 GM/50ML IV SOLN
2.0000 g | Freq: Once | INTRAVENOUS | Status: AC
  Administered 2017-04-26: 2 g via INTRAVENOUS
  Filled 2017-04-26: qty 50

## 2017-04-26 NOTE — Consult Note (Signed)
[facility] Surgery Consult Note  Angie [name] 07/30/1965  [id number].    Requesting MD: Allyson Sabal Chief Complaint/Reason for Consult: dysphagia  HPI:  Angie [name] is a 52yo female with prior h/o gastric bypass surgery in 2007 with profound weight loss and malnutrition. 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 [name]. She is able to tolerate some regular food by mouth, but due to significant malnutrition she underwent laparoscopic g-tube placement 08/16/16 by Dr. Kieth Brightly to improve her nutritional status prior to considering any other revision surgery. Unfortunately the G-tube fell out about 4 weeks ago; patient was scheduled to have IR place a g-tube yesterday but due to anemia and patient's anatomy, she was unable to have percutaneous placement. General surgery asked to see for consideration of repeat laparoscopic g-tube placement. Patient denies abdominal pain, nausea, vomiting, melena, hematochezia. She does reports increased weakness. Denies SOB. She has been tolerating small amounts of a regular diet, if she chews her food enough into small pieces. BM yesterday was normal.  Anticoagulants: none Nonsmoker H/o alcohol abuse On disability  ROS: Review of Systems  Constitutional: Positive for weight loss.  HENT: Negative.   Eyes: Negative.   Respiratory: Negative.   Cardiovascular: Negative.   Gastrointestinal: Negative for abdominal pain, blood in stool, constipation, diarrhea, melena, nausea and vomiting.       Dysphagia  Genitourinary: Negative.   Musculoskeletal: Negative.   Skin: Negative.   Neurological: Positive for weakness.   All systems reviewed and otherwise negative except for as above  Family History  Problem Relation Age of Onset  . Diabetes Father   . Heart disease Father   . Depression Maternal Grandmother   . Heart disease Maternal Grandfather   . Depression Paternal Grandmother   .  Colon cancer Paternal Grandfather   . Pancreatic cancer Paternal Grandfather     Past Medical History:  Diagnosis Date  . Abnormal Pap smear   . Alcohol abuse   . Anemia    IDA  . Anxiety    Takes xanax  . Arthritis    "qwhere" (04/26/2016),   . Bipolar disorder ([place])   . Blood transfusion without reported diagnosis   . Bulging lumbar disc   . Chronic lower back pain   . Depression   . Diverticulitis   . Fibromyalgia   . Hypertension   . Lactose intolerance in adult 2017  . Migraines    "weekly @ least" (04/26/2016)  . Seizures ([place])    last one Dec. 2017  . Type II diabetes mellitus ([place])    "before the gastric bypass" (04/26/2016) states she no longer has diabetes    Past Surgical History:  Procedure Laterality Date  . BALLOON DILATION N/A 05/27/2016   Procedure: BALLOON DILATION;  Surgeon: Doran Stabler, MD;  Location: [facility];  Service: Gastroenterology;  Laterality: N/A;  . BALLOON DILATION N/A 04/21/2017   Procedure: BALLOON DILATION;  Surgeon: Doran Stabler, MD;  Location: [facility];  Service: Gastroenterology;  Laterality: N/A;  . CERVICAL CONE BIOPSY    . [place]; 1996; 1998; 2014  . CESAREAN SECTION WITH BILATERAL TUBAL LIGATION Bilateral 10/28/2012   Procedure: Repeat cesarean section with delivery of baby boy at 85. Apgars 8/9.  BILATERAL TUBAL LIGATION;  Surgeon: Florian Buff, MD;  Location: [facility];  Service: Obstetrics;  Laterality: Bilateral;  . DILATION AND CURETTAGE OF UTERUS    . ESOPHAGOGASTRODUODENOSCOPY N/A 05/27/2016  Procedure: ESOPHAGOGASTRODUODENOSCOPY (EGD);  Surgeon: Doran Stabler, MD;  Location: [facility];  Service: Gastroenterology;  Laterality: N/A;  . ESOPHAGOGASTRODUODENOSCOPY (EGD) WITH PROPOFOL N/A 04/23/2016   Procedure: ESOPHAGOGASTRODUODENOSCOPY (EGD) WITH PROPOFOL;  Surgeon: Doran Stabler, MD;  Location: [facility];  Service: Endoscopy;  Laterality: N/A;  . ESOPHAGOGASTRODUODENOSCOPY (EGD) WITH  PROPOFOL N/A 01/03/2017   Procedure: ESOPHAGOGASTRODUODENOSCOPY (EGD) WITH PROPOFOL;  Surgeon: Doran Stabler, MD;  Location: [facility];  Service: Gastroenterology;  Laterality: N/A;  . ESOPHAGOGASTRODUODENOSCOPY (EGD) WITH PROPOFOL N/A 04/21/2017   Procedure: ESOPHAGOGASTRODUODENOSCOPY (EGD) WITH PROPOFOL;  Surgeon: Doran Stabler, MD;  Location: [place];  Service: Gastroenterology;  Laterality: N/A;  . GASTROSTOMY N/A 08/16/2016   Procedure: LAPAROSCOPIC INSERTION OF GASTROSTOMY TUBE IN REMNANT STOMACH;  Surgeon: Arta Bruce Kinsinger, MD;  Location: [facility];  Service: General;  Laterality: N/A;  . ROUX-EN-Y GASTRIC BYPASS  2007  . TUBAL LIGATION  2014    Social History:  reports that she quit smoking about 5 years ago. Her smoking use included cigarettes. She has a 2.00 pack-year smoking history. she has never used smokeless tobacco. She reports that she drinks about 10.2 oz of alcohol per week. She reports that she does not use drugs.  Allergies:  Allergies  Allergen Reactions  . Iron     Had acute allergy with tachycardia, attention and generalized itching shortly after receiving nulecit ( iron gluconate) infusion.     (Not in a hospital admission)  Prior to Admission medications   Medication Sig Start Date End Date Taking? Authorizing Provider  ALPRAZolam Duanne Moron) 1 MG tablet Take 1 tablet (1 mg total) by mouth at bedtime as needed for anxiety. Patient taking differently: Take 1 mg by mouth 3 (three) times daily.  08/27/16  Yes Eubanks, Carlos American, NP  CARAFATE 1 GM/10ML suspension Take 10 mLs by mouth 3 (three) times daily. Before meals 04/07/17  Yes [provider]  fexofenadine (ALLEGRA) 180 MG tablet Take 180 mg by mouth daily as needed for allergies.    Yes [provider]  lidocaine (XYLOCAINE) 5 % ointment Apply 1 application topically 3 (three) times daily. Apply to Bilateral shoulders 05/11/16  Yes [provider]  nystatin cream  (MYCOSTATIN) Apply 1 application topically daily as needed for wound care. 01/21/17  Yes [provider]  ondansetron (ZOFRAN) 4 MG tablet Take 4 mg by mouth every 6 (six) hours as needed for nausea or vomiting.   Yes [provider]  oxyCODONE-acetaminophen (PERCOCET) 10-325 MG tablet Take 1 tablet by mouth 4 (four) times daily.   Yes [provider]  oxymetazoline (AFRIN) 0.05 % nasal spray Place 2 sprays into both nostrils 2 (two) times daily as needed for congestion.   Yes [provider]  pregabalin (LYRICA) 50 MG capsule Take 50 mg by mouth 3 (three) times daily.   Yes [provider]  QUEtiapine (SEROQUEL) 100 MG tablet Take 1 tablet (100 mg total) by mouth at bedtime. 07/25/16 04/25/17 Yes Emokpae, Courage, MD  cyclobenzaprine (FLEXERIL) 5 MG tablet Take 1 tablet (5 mg total) by mouth 3 (three) times daily as needed for muscle spasms. Patient not taking: Reported on 03/31/2017 08/20/16   Kinsinger, Arta Bruce, MD    Blood pressure (!) 156/93, pulse 62, temperature 98.4 F (36.9 C), temperature source Oral, resp. rate 13, SpO2 100 %. Physical Exam: General: pleasant, cachectic AA female who is laying in bed in NAD HEENT: head is normocephalic, atraumatic.  Sclera are  noninjected.  Pupils equal and round.  Ears and nose without any masses or lesions.  Mouth is pink and moist. Dentition poor Heart: regular, rate, and rhythm.  No obvious murmurs, gallops, or rubs noted.  Palpable pedal pulses bilaterally Lungs: CTAB, no wheezes, rhonchi, or rales noted.  Respiratory effort nonlabored Abd: multiple lap incisions and previous G-tube site cdi, soft, NT/ND, +BS, no masses, hernias, or organomegaly MS: all 4 extremities are symmetrical with no cyanosis, clubbing, or edema. Skin: warm and dry with no masses, lesions, or rashes Psych: A&Ox3 with an appropriate affect. Neuro: cranial nerves grossly intact, extremity CSM intact bilaterally, normal  speech  Results for orders placed or performed during the hospital encounter of 04/25/17 (from the past 48 hour(s))  Type and screen [place] Blood already drawn     Status: None   Collection Time: 04/25/17  1:34 PM  Result Value Ref Range   ABO/RH(D) B POS    Antibody Screen NEG    Sample Expiration 04/28/2017    Unit Number [id number]    Blood Component Type RED CELLS,LR    Unit division 00    Status of Unit ISSUED,FINAL    Transfusion Status OK TO TRANSFUSE    Crossmatch Result Compatible    Unit Number [id number]    Blood Component Type RED CELLS,LR    Unit division 00    Status of Unit ISSUED,FINAL    Transfusion Status OK TO TRANSFUSE    Crossmatch Result Compatible   Prepare RBC     Status: None   Collection Time: 04/25/17  1:34 PM  Result Value Ref Range   Order Confirmation ORDER PROCESSED BY BLOOD BANK   Occult blood card to lab, stool RN will collect     Status: None   Collection Time: 04/25/17  8:14 PM  Result Value Ref Range   Fecal Occult Bld NEGATIVE NEGATIVE  Basic metabolic panel     Status: Abnormal   Collection Time: 04/26/17  4:38 AM  Result Value Ref Range   Sodium 135 135 - 145 mmol/L   Potassium 3.2 (L) 3.5 - 5.1 mmol/L   Chloride 100 (L) 101 - 111 mmol/L   CO2 22 22 - 32 mmol/L   Glucose, Bld 73 65 - 99 mg/dL   BUN 6 6 - 20 mg/dL   Creatinine, Ser 0.75 0.44 - 1.00 mg/dL   Calcium 8.1 (L) 8.9 - 10.3 mg/dL   GFR calc non Af Amer >60 >60 mL/min   GFR calc Af Amer >60 >60 mL/min    Comment: (NOTE) The eGFR has been calculated using the CKD EPI equation. This calculation has not been validated in all clinical situations. eGFR's persistently <60 mL/min signify possible Chronic Kidney Disease.    Anion gap 13 5 - 15  Vitamin B12     Status: None   Collection Time: 04/26/17  4:38 AM  Result Value Ref Range   Vitamin B-12 406 180 - 914 pg/mL    Comment: (NOTE) This assay is not validated for testing neonatal  or myeloproliferative syndrome specimens for Vitamin B12 levels.   Folate     Status: None   Collection Time: 04/26/17  4:38 AM  Result Value Ref Range   Folate 12.9 >5.9 ng/mL  CBG monitoring, ED     Status: Abnormal   Collection Time: 04/26/17  5:23 AM  Result Value Ref Range   Glucose-Capillary 111 (H) 65 - 99 mg/dL   No results found.  Anti-infectives (  From admission, onward)   None       Assessment/Plan HTN H/o alcohol abuse Bipolar Chronic pain  Symptomatic anemia - s/p 2 uPRBC 12/31, Hg up to 9.8 today H/o gastric bypass 2007 with profound weight loss GJ stricture requiring several anastomotic dilatations  Malnutrition Previous laparoscopic gastrostomy tube placement 08/16/2016 Dr. Kieth Brightly, G-tube fell out about 4 weeks ago Dysphagia - Patient is being admitted to the hospital by the medicine team for symptomatic anemia. She was scheduled to have IR place a g-tube yesterday but due to anemia and patient's anatomy, she was unable to have percutaneous placement. General surgery asked to consult for consideration of repeat laparoscopic g-tube. Patient currently eating breakfast and would not be able to have surgery today. Will discuss timing with MD.  ID - none VTE - SCDs FEN - heart healthy diet Foley - none   Wellington Hampshire, [facility] Surgery 04/26/2017, 10:13 AM Pager: [phone] Consults: [phone] Mon-Fri 7:00 am-4:30 pm Sat-Sun 7:00 am-11:30 am

## 2017-04-26 NOTE — ED Notes (Signed)
Notified pharmacy for medications 

## 2017-04-26 NOTE — Progress Notes (Signed)
[facility] PROGRESS NOTE  Goldia Ligman Cypert IOX:[id number] DOB: 02/05/1966 DOA: 04/25/2017   PCP: Ricke Hey, MD     Assessment/Plan: Principal Problem:   Symptomatic Anemia Active Problems:   S/P gastric bypass   Protein-calorie malnutrition, severe   Chronic pain syndrome   Iron deficiency anemia   Dysphagia   Abnormal loss of weight   Symptomatic anemia   52 y.o. female Has a history of esophageal stricture status post dilation multiple times but has severe malnutrition secondary to continued strictures causing Dysphagia from GBP anastomotic stricture patient underwent EGD with balloon dilatation on 04/21/2017 due to gastrojejunal anastamosis stricture, initially plan was for Referal back to Dr. Gayleen Orem at [facility] for [facility] placement in hopes of avoiding revision surgery, however at this time patient has undecided to undergo revision surgery instead.  To allow for revision surgery patient was told to have G-tube replaced to improve nutritional status prior to surgery. She was actually scheduled to get a G-tube placed on 04/25/17 and was found to have a hemoglobin 6.6 rechecked with sent here for transfusion.  Denies bloody stools.  Gastric bypass surgery 2007 Has had multiple anastomotic dilatations Previous gastric tube per Surgery 07/2016 was removed 4 weeks ago Wt loss and Malnutrition  Now with pending reconstruction surgery with Dr Kieth Brightly Request replacement of Gastric tube for nutrition  Assessment and plan  1)Symptomatic Acute on Chronic Anemia- Hgb is down to 6.2, patient is dizzy, with significant fatigue and dyspnea on exertion, ordered to receive transfusion of   2 units of packed cells with Lasix in between, no evidence of acute bleeding at this time, please see recent EGD report dated 04/21/2017.  Patient is status post prior gastric bypass suspect some degree of B12 and folate deficiency.  follow CBC closely, apparently 6.2, after transfusion,  not sure if this is accurate. Will repeat CBC again this morning  2) Hypokalemia/G-tube placement- being replaced, patient is now able to tolerate some oral intake after EGD with dilatation on 04/21/2017 however caloric intake is not enough to allow for reconstructive surgery, Now with pending reconstruction surgery with Dr Kieth Brightly she would need replacement of Gastric tube for nutrition.  this was initially supposed to be done by interventional radiology. Anatomy is not conducive to percutaneous placement of a surgical replacement will be required. Will consult general surgery to see if this can be scheduled as inpatient.   PPI and Carafate as ordered  3)Social/Ethics- Plan of care and CODE STATUS discussed with patient she is a full code  4)Bipolar Disorder-stable at this time, resume Seroquel,    DVT prophylaxsis  SCDs  Code Status: Full code     Family Communication: Discussed in detail with the patient, all imaging results, lab results explained to the patient   Disposition Plan:  Anticipate discharge in 1-2 days      Consultants:  General surgery  Procedures:  None  Antibiotics: Anti-infectives (From admission, onward)   None         HPI/Subjective: Patient denies any active bleeding, any hematochezia, melena  Objective: Vitals:   04/26/17 0700 04/26/17 0730 04/26/17 0745 04/26/17 0800  BP: (!) 141/96 (!) 130/102 139/89 111/76  Pulse: 63 65 (!) 53 66  Resp: 17 12 (!) 21 (!) 8  Temp:      TempSrc:      SpO2: 100% 100% 100% 100%    Intake/Output Summary (Last 24 hours) at 04/26/2017 0813 Last data filed at 04/26/2017  9381 Gross per 24 hour  Intake 1046.33 ml  Output -  Net 1046.33 ml    Exam:  Examination:  General exam: Cachectic appearing Respiratory system: Clear to auscultation. Respiratory effort normal. Cardiovascular system: S1 & S2 heard, RRR. No JVD, murmurs, rubs, gallops or clicks. No pedal edema. Gastrointestinal system: Abdomen is  nondistended, soft and nontender. No organomegaly or masses felt. Normal bowel sounds heard. Central nervous system: Alert and oriented. No focal neurological deficits. Extremities: Symmetric 5 x 5 power. Skin: No rashes, lesions or ulcers Psychiatry: Judgement and insight appear normal. Mood & affect appropriate.     Data Reviewed: I have personally reviewed following labs and imaging studies  Micro Results No results found for this or any previous visit (from the past 240 hour(s)).  Radiology Reports No results found.   CBC Recent Labs  Lab 04/25/17 0750 04/25/17 0925  WBC 3.7* 2.8*  HGB 6.6* 6.2*  HCT 20.8* 19.7*  PLT 416* 348  MCV 71.5* 71.4*  MCH 22.7* 22.5*  MCHC 31.7 31.5  RDW 19.5* 19.5*    Chemistries  Recent Labs  Lab 04/25/17 0750 04/26/17 0438  NA 135 135  K 2.9* 3.2*  CL 103 100*  CO2 21* 22  GLUCOSE 57* 73  BUN 9 6  CREATININE 0.74 0.75  CALCIUM 8.2* 8.1*   ------------------------------------------------------------------------------------------------------------------ estimated creatinine clearance is 60.3 mL/min (by C-G formula based on SCr of 0.75 mg/dL). ------------------------------------------------------------------------------------------------------------------ No results for input(s): HGBA1C in the last 72 hours. ------------------------------------------------------------------------------------------------------------------ No results for input(s): CHOL, HDL, LDLCALC, TRIG, CHOLHDL, LDLDIRECT in the last 72 hours. ------------------------------------------------------------------------------------------------------------------ No results for input(s): TSH, T4TOTAL, T3FREE, THYROIDAB in the last 72 hours.  Invalid input(s): FREET3 ------------------------------------------------------------------------------------------------------------------ Recent Labs    04/26/17 0438  VITAMINB12 406  FOLATE 12.9    Coagulation  profile Recent Labs  Lab 04/25/17 0750  INR 1.00    No results for input(s): DDIMER in the last 72 hours.  Cardiac Enzymes No results for input(s): CKMB, TROPONINI, MYOGLOBIN in the last 168 hours.  Invalid input(s): CK ------------------------------------------------------------------------------------------------------------------ Invalid input(s): POCBNP   CBG: No results for input(s): GLUCAP in the last 168 hours.     Studies: No results found.    Lab Results  Component Value Date   HGBA1C 4.9 08/12/2016   HGBA1C 4.8 04/25/2016   HGBA1C 4.9 02/02/2016   Lab Results  Component Value Date   LDLCALC  01/22/2007    47        Total Cholesterol/HDL:CHD Risk Coronary Heart Disease Risk Table                     Men   Women  1/2 Average Risk   3.4   3.3   CREATININE 0.75 04/26/2017       Scheduled Meds: . lidocaine  1 application Topical TID  . loratadine  10 mg Oral Daily  . oxyCODONE-acetaminophen  1 tablet Oral QID   And  . oxyCODONE  5 mg Oral QID  . pantoprazole  40 mg Oral BID  . potassium chloride  40 mEq Oral Once  . pregabalin  50 mg Oral TID  . QUEtiapine  100 mg Oral QHS  . senna  1 tablet Oral BID  . sodium chloride flush  3 mL Intravenous Q12H  . sucralfate  1 g Oral TID AC   Continuous Infusions: . sodium chloride    . dextrose 5 % and 0.45% NaCl 50 mL/hr at 04/25/17 2010  LOS: 1 day    Time spent: >30 MINS    Reyne Dumas  [facility] Pager [phone]. If 7PM-7AM, please contact night-coverage at [email or web address], password [facility] 04/26/2017, 8:13 AM  LOS: 1 day

## 2017-04-26 NOTE — ED Notes (Signed)
Admitting MD at bedside.

## 2017-04-27 LAB — RETICULOCYTES
RBC.: 4.16 MIL/uL (ref 3.87–5.11)
Retic Count, Absolute: 16.6 10*3/uL — ABNORMAL LOW (ref 19.0–186.0)
Retic Ct Pct: 0.4 % (ref 0.4–3.1)

## 2017-04-27 LAB — CBC
HCT: 29.6 % — ABNORMAL LOW (ref 36.0–46.0)
Hemoglobin: 9.6 g/dL — ABNORMAL LOW (ref 12.0–15.0)
MCH: 24.9 pg — ABNORMAL LOW (ref 26.0–34.0)
MCHC: 32.4 g/dL (ref 30.0–36.0)
MCV: 76.7 fL — ABNORMAL LOW (ref 78.0–100.0)
Platelets: 308 10*3/uL (ref 150–400)
RBC: 3.86 MIL/uL — ABNORMAL LOW (ref 3.87–5.11)
RDW: 19 % — ABNORMAL HIGH (ref 11.5–15.5)
WBC: 3.8 10*3/uL — ABNORMAL LOW (ref 4.0–10.5)

## 2017-04-27 LAB — COMPREHENSIVE METABOLIC PANEL
ALT: 9 U/L — ABNORMAL LOW (ref 14–54)
AST: 16 U/L (ref 15–41)
Albumin: 2.9 g/dL — ABNORMAL LOW (ref 3.5–5.0)
Alkaline Phosphatase: 78 U/L (ref 38–126)
Anion gap: 6 (ref 5–15)
BUN: <5 mg/dL — ABNORMAL LOW (ref 6–20)
CO2: 21 mmol/L — ABNORMAL LOW (ref 22–32)
Calcium: 8.3 mg/dL — ABNORMAL LOW (ref 8.9–10.3)
Chloride: 106 mmol/L (ref 101–111)
Creatinine, Ser: 0.69 mg/dL (ref 0.44–1.00)
GFR calc Af Amer: >60 mL/min (ref >60)
GFR calc non Af Amer: >60 mL/min (ref >60)
Glucose, Bld: 62 mg/dL — ABNORMAL LOW (ref 65–99)
Potassium: 4 mmol/L (ref 3.5–5.1)
Sodium: 133 mmol/L — ABNORMAL LOW (ref 135–145)
Total Bilirubin: 0.3 mg/dL (ref 0.3–1.2)
Total Protein: 5.5 g/dL — ABNORMAL LOW (ref 6.5–8.1)

## 2017-04-27 LAB — HIV ANTIBODY (ROUTINE TESTING W REFLEX): HIV Screen 4th Generation wRfx: NONREACTIVE

## 2017-04-27 LAB — IRON AND TIBC
Iron: 31 ug/dL (ref 28–170)
Saturation Ratios: 9 % — ABNORMAL LOW (ref 10.4–31.8)
TIBC: 340 ug/dL (ref 250–450)
UIBC: 309 ug/dL

## 2017-04-27 LAB — FERRITIN: Ferritin: 93 ng/mL (ref 11–307)

## 2017-04-27 LAB — MAGNESIUM: Magnesium: 1.7 mg/dL (ref 1.7–2.4)

## 2017-04-27 MED ORDER — SODIUM CHLORIDE 0.9 % IV SOLN: 250.0000 mL | INTRAVENOUS | Status: AC | PRN

## 2017-04-27 MED ORDER — KCL IN DEXTROSE-NACL 20-5-0.9 MEQ/L-%-% IV SOLN
INTRAVENOUS | Status: DC
  Administered 2017-04-27 – 2017-04-28 (×2): via INTRAVENOUS
  Filled 2017-04-27 (×3): qty 1000

## 2017-04-27 MED ORDER — VITAMIN B-1 100 MG PO TABS
100.0000 mg | ORAL_TABLET | Freq: Every day | ORAL | Status: DC
  Administered 2017-04-27 – 2017-04-28 (×2): 100 mg via ORAL
  Filled 2017-04-27 (×2): qty 1

## 2017-04-27 MED ORDER — SODIUM CHLORIDE 0.9 % IV SOLN
510.0000 mg | Freq: Once | INTRAVENOUS | Status: AC
  Administered 2017-04-27: 510 mg via INTRAVENOUS
  Filled 2017-04-27: qty 17

## 2017-04-27 MED ORDER — ALPRAZOLAM 0.5 MG PO TABS
0.5000 mg | ORAL_TABLET | Freq: Three times a day (TID) | ORAL | Status: DC | PRN
  Administered 2017-04-27: 0.5 mg via ORAL
  Filled 2017-04-27: qty 1

## 2017-04-27 MED ORDER — POTASSIUM CHLORIDE IN NACL 20-0.9 MEQ/L-% IV SOLN: INTRAVENOUS | Status: DC

## 2017-04-27 MED ORDER — MAGNESIUM SULFATE IN D5W 1-5 GM/100ML-% IV SOLN
1.0000 g | Freq: Once | INTRAVENOUS | Status: AC
  Administered 2017-04-27: 1 g via INTRAVENOUS
  Filled 2017-04-27: qty 100

## 2017-04-27 MED ORDER — BOOST / RESOURCE BREEZE PO LIQD CUSTOM
1.0000 | Freq: Three times a day (TID) | ORAL | Status: DC
  Administered 2017-04-27 – 2017-04-28 (×4): 1 via ORAL

## 2017-04-27 NOTE — Progress Notes (Signed)
Progress Note: General Surgery Service   Assessment/Plan: Patient Active Problem List   Diagnosis Date Noted  . Symptomatic Anemia 04/25/2017  . Symptomatic anemia 04/25/2017  . Abnormal loss of weight   . Dysphagia   . S/P percutaneous endoscopic gastrostomy (PEG) tube placement ([place]) 09/06/2016  . At risk for adverse drug event 08/25/2016  . Malnutrition following gastrointestinal surgery 08/16/2016  . Anastomotic ulcer 07/21/2016  . Transient alteration of awareness   . Alcohol use 04/27/2016  . Chronic pain syndrome 04/27/2016  . Iron deficiency anemia 04/27/2016  . B12 deficiency 04/27/2016  . Gastrojejunal anastomotic stricture   . Hypoglycemia 04/22/2016  . Syncope 04/22/2016  . Protein-calorie malnutrition, severe 04/22/2016  . Nausea and vomiting   . Abdominal pain, chronic, epigastric   . Hypotension 02/01/2016  . Hyponatremia 02/01/2016  . Subacromial impingement of left shoulder 07/06/2013  . Trochanteric bursitis of left hip 05/11/2013  . Arthritis 05/16/2012  . S/P gastric bypass 05/16/2012   s/p    Tolerating diet better than prior to dilation. Weight stable over last month. -dietician consult for malnutrition -no plan for surgery to replace G tube during admission -will continue to follow -strict intake   LOS: 2 days  Chief Complaint/Subjective: Tolerated yogurt, grapes, water, grits today, no nausea, tolerated iron infusion without reaction  Objective: Vital signs in last 24 hours: Temp:  [97.9 F (36.6 C)-98.2 F (36.8 C)] 97.9 F (36.6 C) (01/02 0423) Pulse Rate:  [72-85] 85 (01/02 0423) Resp:  [15-18] 15 (01/02 0423) BP: (102-182)/(67-113) 102/67 (01/02 0423) SpO2:  [100 %] 100 % (01/02 0423) Weight:  [49.8 kg (109 lb 12.8 oz)] 49.8 kg (109 lb 12.8 oz) (01/01 1610) Last BM Date: 04/26/17  Intake/Output from previous day: 01/01 0701 - 01/02 0700 In: 1073.7 [P.O.:402; I.V.:671.7] Out: 0  Intake/Output this shift: Total I/O In: 240  [P.O.:240] Out: -   Lungs: CTab  Cardiovascular: RRR  Abd: soft, NT, ND  Extremities: no edema  Neuro: AOx4  Lab Results: CBC  Recent Labs    04/26/17 0822 04/27/17 0554  WBC 3.8* 3.8*  HGB 9.8* 9.6*  HCT 30.4* 29.6*  PLT 336 308   BMET Recent Labs    04/26/17 0438 04/27/17 0554  NA 135 133*  K 3.2* 4.0  CL 100* 106  CO2 22 21*  GLUCOSE 73 62*  BUN 6 <5*  CREATININE 0.75 0.69  CALCIUM 8.1* 8.3*   PT/INR Recent Labs    04/25/17 0750  LABPROT 13.1  INR 1.00   ABG No results for input(s): PHART, HCO3 in the last 72 hours.  Invalid input(s): PCO2, PO2  Studies/Results:  Anti-infectives: Anti-infectives (From admission, onward)   None      Medications: Scheduled Meds: . feeding supplement  1 Container Oral TID BM  . lidocaine  1 application Topical TID  . loratadine  10 mg Oral Daily  . multivitamin  1 tablet Oral Daily  . oxyCODONE-acetaminophen  1 tablet Oral QID   And  . oxyCODONE  5 mg Oral QID  . pantoprazole  40 mg Oral BID  . potassium chloride  40 mEq Oral Once  . pregabalin  50 mg Oral TID  . QUEtiapine  100 mg Oral QHS  . senna  1 tablet Oral BID  . sodium chloride flush  3 mL Intravenous Q12H  . sucralfate  1 g Oral TID AC  . thiamine  100 mg Oral Daily   Continuous Infusions: . sodium chloride    .  dextrose 5 % and 0.9 % NaCl with KCl 20 mEq/L 75 mL/hr at 04/27/17 1058   PRN Meds:.sodium chloride, acetaminophen **OR** acetaminophen, albuterol, ALPRAZolam, morphine injection, nystatin cream, ondansetron **OR** ondansetron (ZOFRAN) IV, oxymetazoline, polyethylene glycol, sodium chloride flush, traZODone  [name], MD Pg# [phone] [facility]

## 2017-04-27 NOTE — Progress Notes (Signed)
[facility] PROGRESS NOTE  Angie [name] HYQ:[id number] DOB: 21-Jul-1965 DOA: 04/25/2017   PCP: Angie Hey, MD     Assessment/Plan: Principal Problem:   Symptomatic Anemia Active Problems:   S/P gastric bypass   Protein-calorie malnutrition, severe   Chronic pain syndrome   Iron deficiency anemia   Dysphagia   Abnormal loss of weight   Symptomatic anemia   52 y.o. 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 Gayleen Orem at [place] for [facility] placement in hopes of avoiding revision surgery, however at this time patient has undecided to undergo revision surgery instead.  To allow for revision surgery patient was told to have G-tube replaced to improve nutritional status prior to surgery. She was actually scheduled to get a G-tube placed on 04/25/17 and was found to have a hemoglobin 6.6>6.2 and patient was admitted here for transfusion  . Gastric bypass surgery 2007 Has had multiple anastomotic dilatations Previous gastric tube per Surgery 07/2016 was removed 4 weeks ago Wt loss and Malnutrition  Now with pending reconstruction surgery with Dr Kieth Brightly Request replacement of Gastric tube for nutrition  Assessment and plan  1)Symptomatic Acute on Chronic Anemia- Hgb  down to 6.2, 9.6-9.8 after 2 units of packed red blood cells. Patient presented with symptomatic anemia, was dizzy, with significant fatigue and dyspnea on exertion,. Without evidence of acute bleeding at this time, please see recent EGD report dated 04/21/2017.  Patient is status post prior gastric bypass suspect some degree of B12 and folate deficiency.  follow  up CBC 9.6 after transfusion,  follow CBC closely.  Patient apparently allergic to iron. Will check iron studies. Pharmacy consult to see  which formulation of iron she is allergic to, and what are alternative formulations that can be given. Also started on thiamine and multivitamin.  2) Hypokalemia/hypokalemia/malnutrition G-tube placement- being replaced, patient is now able to tolerate some oral intake after EGD with dilatation on 04/21/2017 however caloric intake is not enough to allow for reconstructive surgery, Now with pending reconstruction surgery with Dr Kieth Brightly she would need replacement of Gastric tube for nutrition.  this was initially supposed to be done by interventional radiology. Anatomy is not conducive to percutaneous placement of a surgical replacement will be required. Consulted general surgery  who have seen the patient and will discuss with interventional radiology in terms of further plan of care.   PPI and Carafate as ordered  3)Social/Ethics- Plan of care and CODE STATUS discussed with patient she is a full code  4)Bipolar Disorder-stable at this time, resume Seroquel,    DVT prophylaxsis  SCDs  Code Status: Full code     Family Communication: Discussed in detail with the patient, all imaging results, lab results explained to the patient   Disposition Plan:  Anticipate discharge in 1-2 days      Consultants:  General surgery  Procedures:  None  Antibiotics: Anti-infectives (From admission, onward)   None         HPI/Subjective:  Patient had 2-3 bowel movements yesterday all of them were nonbloody, she thinks that her appetite is improving slowly and she eating well  Objective: Vitals:   04/26/17 1400 04/26/17 1610 04/26/17 2129 04/27/17 0423  BP: (!) 182/113 136/88 Marland Kitchen)  165/101 102/67  Pulse: 76 77 72 85  Resp:  18 18 15   Temp:  98.2 F (36.8 C) 98.1 F (36.7 C) 97.9 F (36.6 C)  TempSrc:  Oral Oral   SpO2: 100% 100% 100% 100%  Weight:  49.8 kg (109 lb 12.8 oz)    Height:  5' (1.524  m)      Intake/Output Summary (Last 24 hours) at 04/27/2017 0840 Last data filed at 04/27/2017 0600 Gross per 24 hour  Intake 1073.67 ml  Output 0 ml  Net 1073.67 ml    Exam:  Examination:  General exam: Cachectic appearing Respiratory system: Clear to auscultation. Respiratory effort normal. Cardiovascular system: S1 & S2 heard, RRR. No JVD, murmurs, rubs, gallops or clicks. No pedal edema. Gastrointestinal system: Abdomen is nondistended, soft and nontender. No organomegaly or masses felt. Normal bowel sounds heard. Central nervous system: Alert and oriented. No focal neurological deficits. Extremities: Symmetric 5 x 5 power. Skin: No rashes, lesions or ulcers Psychiatry: Judgement and insight appear normal. Mood & affect appropriate.     Data Reviewed: I have personally reviewed following labs and imaging studies  Micro Results No results found for this or any previous visit (from the past 240 hour(s)).  Radiology Reports No results found.   CBC Recent Labs  Lab 04/25/17 0750 04/25/17 0925 04/26/17 0438 04/26/17 0822 04/27/17 0554  WBC 3.7* 2.8* 4.3 3.8* 3.8*  HGB 6.6* 6.2* 9.7* 9.8* 9.6*  HCT 20.8* 19.7* 30.4* 30.4* 29.6*  PLT 416* 348 369 336 308  MCV 71.5* 71.4* 76.2* 75.4* 76.7*  MCH 22.7* 22.5* 24.3* 24.3* 24.9*  MCHC 31.7 31.5 31.9 32.2 32.4  RDW 19.5* 19.5* 19.2* 18.9* 19.0*    Chemistries  Recent Labs  Lab 04/25/17 0750 04/26/17 0438 04/26/17 0822 04/27/17 0554  NA 135 135  --  133*  K 2.9* 3.2*  --  4.0  CL 103 100*  --  106  CO2 21* 22  --  21*  GLUCOSE 57* 73  --  62*  BUN 9 6  --  <5*  CREATININE 0.74 0.75  --  0.69  CALCIUM 8.2* 8.1*  --  8.3*  MG  --   --  1.4* 1.7  AST  --   --   --  16  ALT  --   --   --  9*  ALKPHOS  --   --   --  78  BILITOT  --   --   --  0.3   ------------------------------------------------------------------------------------------------------------------ estimated creatinine clearance is 60.4 mL/min (by  C-G formula based on SCr of 0.69 mg/dL). ------------------------------------------------------------------------------------------------------------------ No results for input(s): HGBA1C in the last 72 hours. ------------------------------------------------------------------------------------------------------------------ No results for input(s): CHOL, HDL, LDLCALC, TRIG, CHOLHDL, LDLDIRECT in the last 72 hours. ------------------------------------------------------------------------------------------------------------------ No results for input(s): TSH, T4TOTAL, T3FREE, THYROIDAB in the last 72 hours.  Invalid input(s): FREET3 ------------------------------------------------------------------------------------------------------------------ Recent Labs    04/26/17 0438  VITAMINB12 406  FOLATE 12.9    Coagulation profile Recent Labs  Lab 04/25/17 0750  INR 1.00    No results for input(s): DDIMER in the last 72 hours.  Cardiac Enzymes No results for input(s): CKMB, TROPONINI, MYOGLOBIN in the last 168 hours.  Invalid input(s): CK ------------------------------------------------------------------------------------------------------------------ Invalid input(s): POCBNP   CBG: Recent Labs  Lab 04/26/17 0523  GLUCAP 111*       Studies: No results found.    Lab Results  Component Value Date   HGBA1C 4.9 08/12/2016   HGBA1C 4.8 04/25/2016  HGBA1C 4.9 02/02/2016   Lab Results  Component Value Date   LDLCALC  01/22/2007    47        Total Cholesterol/HDL:CHD Risk Coronary Heart Disease Risk Table                     Men   Women  1/2 Average Risk   3.4   3.3   CREATININE 0.69 04/27/2017       Scheduled Meds: . lidocaine  1 application Topical TID  . loratadine  10 mg Oral Daily  . multivitamin  1 tablet Oral Daily  . oxyCODONE-acetaminophen  1 tablet Oral QID   And  . oxyCODONE  5 mg Oral QID  . pantoprazole  40 mg Oral BID  . potassium chloride  40  mEq Oral Once  . pregabalin  50 mg Oral TID  . QUEtiapine  100 mg Oral QHS  . senna  1 tablet Oral BID  . sodium chloride flush  3 mL Intravenous Q12H  . sucralfate  1 g Oral TID AC  . thiamine  100 mg Oral Daily   Continuous Infusions: . sodium chloride    . dextrose 5 % and 0.9 % NaCl with KCl 20 mEq/L    . magnesium sulfate 1 - 4 g bolus IVPB       LOS: 2 days    Time spent: >30 MINS    Reyne Dumas  [facility] Pager [phone]. If 7PM-7AM, please contact night-coverage at [email or web address], password [email or web address] 04/27/2017, 8:40 AM  LOS: 2 days

## 2017-04-27 NOTE — Progress Notes (Signed)
Initial Nutrition Assessment  DOCUMENTATION CODES:   Underweight, Severe malnutrition in context of chronic illness  INTERVENTION:   -Boost Breeze po TID, each supplement provides 250 kcal and 9 grams of protein -Continue MVI daily -Initiate 48 hour calorie count per MD; follow-up on 04/28/17 for results  -If PEG is able to be replaced, recommend:   Initiate Jevity 1.2  @ 80 ml/hr via PEG x 12 hours  Recommend 100 ml free water flush 4 times daily  Tube feeding regimen provides 1152 kcal (74% of needs), 53 grams of protein, and 1175 ml of H2O.   NUTRITION DIAGNOSIS:   Severe Malnutrition related to chronic illness as evidenced by energy intake < 75% for > or equal to 1 month, severe fat depletion, moderate muscle depletion, severe muscle depletion.  GOAL:   Patient will meet greater than or equal to 90% of their needs  MONITOR:   PO intake, Supplement acceptance, Labs, Weight trends, Skin, I & O's  REASON FOR ASSESSMENT:   Consult, Malnutrition Screening Tool Assessment of nutrition requirement/status  ASSESSMENT:   52 y.o. female Has a history of esophageal stricture status post dilation multiple times but has severe malnutrition secondary to continued strictures causing Dysphagia from GBP anastomotic stricture patient underwent EGD with balloon dilatation on 04/21/2017 due to  gastrojejunal anastamosis stricture , initially plan was for Referal back to Dr. Gayleen Orem at [place] for [facility] placement in hopes of avoiding revision surgery, however at this time patient has undecided to undergo revision surgery instead.  To allow for revision surgery patient was told to have G-tube replaced to improve nutritional status prior to surgery. She was actually scheduled to get a G-tube placed on 04/25/17 and was found to have a hemoglobin 6.6 rechecked with sent here for transfusion.  Denies bloody stools.  Pt admitted with anemia.   Spoke with pt at bedside, who reports her appetite has  increased greatly since being admitted. She reports she has consumed a [place] sandwich, grits, eggs, and grapes within the past 12 hours. Pt shares poor oral intake unitl very recently related to esophageal stricture (s/p dilation on 04/21/17). She consumes a regular diet at home, but is very selective about what she eats due to lack of teeth. She consumes 3-4 Ensure Clear supplements daily. Pt enjoys the Boost Breeze supplements and does not tolerate milky-type supplements well.   Pt reports her PEG "fell out" approximately one month ago. Prior to this, pt reports eating PO and receiving nocturnal feedings of Jevity 1.2 over 12 hour period (pt unsure of rate). Pt very optimistic about nutritional status- she is looking forward to g-tube replacement, reconstruction surgery, and dental implants.   Reviewed wt hx; noted pt with significant distant wt loss. Wt has fluctuated between 101-139# within the past year.  Discussed importance of good meal and supplement intake to promote healing.   Calorie count ordered by MD after RD visit; will follow-up tomorrow (04/28/17) for calorie count results.   Medications reviewed and include senna and thiamine.   Labs reviewed: Na: 133, CBGS: 111  NUTRITION - FOCUSED PHYSICAL EXAM:    Most Recent Value  Orbital Region  Severe depletion  Upper Arm Region  Severe depletion  Thoracic and Lumbar Region  Severe depletion  Buccal Region  Severe depletion  Temple Region  Severe depletion  Clavicle Bone Region  Severe depletion  Clavicle and Acromion Bone Region  Severe depletion  Scapular Bone Region  Severe depletion  Dorsal Hand  Moderate  depletion  Patellar Region  Moderate depletion  Anterior Thigh Region  Moderate depletion  Posterior Calf Region  Moderate depletion  Edema (RD Assessment)  None  Hair  Reviewed  Eyes  Reviewed  Mouth  Reviewed  Skin  Reviewed  Nails  Reviewed       Diet Order:  Diet Heart Room service appropriate? Yes; Fluid  consistency: Thin Diet NPO time specified  EDUCATION NEEDS:   Education needs have been addressed  Skin:  Skin Assessment: Reviewed RN Assessment  Last BM:  04/26/17  Height:   Ht Readings from Last 1 Encounters:  04/26/17 5' (1.524 m)    Weight:   Wt Readings from Last 1 Encounters:  04/26/17 109 lb 12.8 oz (49.8 kg)    Ideal Body Weight:  45.5 kg  BMI:  Body mass index is 21.44 kg/m.  Estimated Nutritional Needs:   Kcal:  1550-1750  Protein:  85-100 grams  Fluid:  > 1.5 L    Quynn Vilchis A. Jimmye Norman, RD, LDN, CDE Pager: [phone] After hours Pager: [phone]

## 2017-04-28 DIAGNOSIS — E43 Unspecified severe protein-calorie malnutrition: Secondary | ICD-10-CM

## 2017-04-28 DIAGNOSIS — D500 Iron deficiency anemia secondary to blood loss (chronic): Secondary | ICD-10-CM

## 2017-04-28 DIAGNOSIS — D649 Anemia, unspecified: Secondary | ICD-10-CM

## 2017-04-28 DIAGNOSIS — G894 Chronic pain syndrome: Secondary | ICD-10-CM

## 2017-04-28 DIAGNOSIS — R634 Abnormal weight loss: Secondary | ICD-10-CM

## 2017-04-28 DIAGNOSIS — D508 Other iron deficiency anemias: Secondary | ICD-10-CM

## 2017-04-28 DIAGNOSIS — Z9884 Bariatric surgery status: Secondary | ICD-10-CM

## 2017-04-28 DIAGNOSIS — R1310 Dysphagia, unspecified: Secondary | ICD-10-CM

## 2017-04-28 LAB — CBC
HCT: 30.9 % — ABNORMAL LOW (ref 36.0–46.0)
Hemoglobin: 9.5 g/dL — ABNORMAL LOW (ref 12.0–15.0)
MCH: 24.1 pg — ABNORMAL LOW (ref 26.0–34.0)
MCHC: 30.7 g/dL (ref 30.0–36.0)
MCV: 78.2 fL (ref 78.0–100.0)
Platelets: 340 10*3/uL (ref 150–400)
RBC: 3.95 MIL/uL (ref 3.87–5.11)
RDW: 19.1 % — ABNORMAL HIGH (ref 11.5–15.5)
WBC: 3.4 10*3/uL — ABNORMAL LOW (ref 4.0–10.5)

## 2017-04-28 LAB — COMPREHENSIVE METABOLIC PANEL
ALT: 8 U/L — ABNORMAL LOW (ref 14–54)
AST: 16 U/L (ref 15–41)
Albumin: 2.9 g/dL — ABNORMAL LOW (ref 3.5–5.0)
Alkaline Phosphatase: 70 U/L (ref 38–126)
Anion gap: 5 (ref 5–15)
BUN: 6 mg/dL (ref 6–20)
CO2: 20 mmol/L — ABNORMAL LOW (ref 22–32)
Calcium: 8.2 mg/dL — ABNORMAL LOW (ref 8.9–10.3)
Chloride: 111 mmol/L (ref 101–111)
Creatinine, Ser: 0.66 mg/dL (ref 0.44–1.00)
GFR calc Af Amer: >60 mL/min (ref >60)
GFR calc non Af Amer: >60 mL/min (ref >60)
Glucose, Bld: 74 mg/dL (ref 65–99)
Potassium: 4.5 mmol/L (ref 3.5–5.1)
Sodium: 136 mmol/L (ref 135–145)
Total Bilirubin: 0.7 mg/dL (ref 0.3–1.2)
Total Protein: 5.5 g/dL — ABNORMAL LOW (ref 6.5–8.1)

## 2017-04-28 NOTE — Discharge Summary (Signed)
Physician Discharge Summary  Angie [name] HBZ:[id number] DOB: 1966-01-15 DOA: 04/25/2017  PCP: Ricke Hey, MD  Admit date: 04/25/2017 Discharge date: 04/28/2017  Admitted From: Home Disposition: Patient being discharged home  Recommendations for Outpatient Follow-up:  1. Follow up with PCP in 1-2 weeks 2. Please obtain BMP/CBC in one week 3. Please follow up on the following pending results:  Discharge Condition: Stable CODE STATUS: Full code Diet recommendation: Heart Healthy  Brief/Interim Summary: Angie [name]  is a 52 y.o. female  with a history of esophageal stricture status post dilation multiple times but has severe malnutrition secondary to continued strictures causing Dysphagia from GBP anastomotic stricture patient underwent EGD with balloon dilatation on 04/21/2017 due to gastrojejunal anastamosis stricture, initially plan was for Referal back to Dr. Gayleen Orem at [place] for [facility] placement in hopes of avoiding revision surgery, however at this time patient has undecided to undergo revision surgery instead.  To allow for revision surgery patient was told to have G-tube replaced to improve nutritional status prior to surgery. She was actually scheduled to get a G-tube placed on 04/25/17 and was found to have a hemoglobin 6.6 rechecked with sent here for transfusion.  She was admitted around 05/26/2016.  Following is the patient hospital course problem list: 1)Symptomatic Acute onChronicAnemia- Hgb  down to 6.2, 9.6-9.8 after 2 units of packed red blood cells. Patient presented with symptomatic anemia, was dizzy, with significant fatigue and dyspnea on exertion,. Without evidence of acute bleeding at this time,please see recent EGD report dated 04/21/2017.Patient is status post prior gastric bypass suspect some degree of B12 and folate deficiency. follow  up CBC 9.6 after transfusion.  Currently stable.  Patient denies having any complaints and is requesting to be  discharged home.  She was advised to follow-up with her primary physician out patient.  2) Hypokalemia/hypokalemia/malnutrition G-tube placement- being replaced, patient is now able to tolerate some oral intake after EGD with dilatation on 04/21/2017 however caloric intake is not enough to allow for reconstructive surgery?She was seen by Dr. Kieth Brightly and per his note "52 yo female s/p gastric bypass with GJ stenosis s/p dilations, tolerated 1.5l of food yesterday and >1000cal -no plans for procedure during hospitalization -will follow her up outpatient with labs and weights to look more at nutrition"  Because there is no plans for surgery during this hospitalization patient will be discharged home to follow-up with a surgeon outpatient.  Patient agreeable to the plan of care.  3)Social/Ethics- Planof care and CODE STATUS discussed with patient she is a full code  4)Bipolar Disorder-stable at this time, resume Seroquel  Patient understands that she feels a lot better and is requesting to be discharged home.  Vitals, physical examination at the time of discharge is stable.  Discharge Diagnoses:  Principal Problem:   Symptomatic Anemia Active Problems:   S/P gastric bypass   Protein-calorie malnutrition, severe   Chronic pain syndrome   Iron deficiency anemia   Dysphagia   Abnormal loss of weight   Symptomatic anemia  Discharge Instructions  Discharge Instructions    Call MD for:  persistant nausea and vomiting   Complete by:  As directed    Diet - low sodium heart healthy   Complete by:  As directed    Increase activity slowly   Complete by:  As directed      Patient advised to resume all her home medications.  Allergies  Allergen Reactions  . Iron     Had acute allergy  with tachycardia, attention and generalized itching shortly after receiving nulecit ( iron gluconate) infusion.    Consultations: Dr. Kieth Brightly Surgery   Discharge Exam: Vitals:   04/27/17 2112  04/28/17 0453  BP: 130/83 140/87  Pulse: 97 72  Resp: 18 18  Temp: 98.2 F (36.8 C) (!) 97.5 F (36.4 C)  SpO2: 100% 100%   Vitals:   04/27/17 0423 04/27/17 1500 04/27/17 2112 04/28/17 0453  BP: 102/67 (!) 132/94 130/83 140/87  Pulse: 85 87 97 72  Resp: 15 18 18 18   Temp: 97.9 F (36.6 C) 98.2 F (36.8 C) 98.2 F (36.8 C) (!) 97.5 F (36.4 C)  TempSrc:  Oral Oral Oral  SpO2: 100% 100% 100% 100%  Weight:      Height:        General: Pt is alert, awake, not in acute distress Cardiovascular: RRR, S1/S2 +, no rubs, no gallops Respiratory: CTA bilaterally, no wheezing, no rhonchi Abdominal: Soft, NT, ND, bowel sounds + Extremities: no edema, no cyanosis    The results of significant diagnostics from this hospitalization (including imaging, microbiology, ancillary and laboratory) are listed below for reference.     Microbiology: No results found for this or any previous visit (from the past 240 hour(s)).   Labs: BNP (last 3 results) No results for input(s): BNP in the last 8760 hours. Basic Metabolic Panel: Recent Labs  Lab 04/25/17 0750 04/26/17 0438 04/26/17 0822 04/27/17 0554 04/28/17 0451  NA 135 135  --  133* 136  K 2.9* 3.2*  --  4.0 4.5  CL 103 100*  --  106 111  CO2 21* 22  --  21* 20*  GLUCOSE 57* 73  --  62* 74  BUN 9 6  --  <5* 6  CREATININE 0.74 0.75  --  0.69 0.66  CALCIUM 8.2* 8.1*  --  8.3* 8.2*  MG  --   --  1.4* 1.7  --    Liver Function Tests: Recent Labs  Lab 04/27/17 0554 04/28/17 0451  AST 16 16  ALT 9* 8*  ALKPHOS 78 70  BILITOT 0.3 0.7  PROT 5.5* 5.5*  ALBUMIN 2.9* 2.9*   No results for input(s): LIPASE, AMYLASE in the last 168 hours. No results for input(s): AMMONIA in the last 168 hours. CBC: Recent Labs  Lab 04/25/17 0925 04/26/17 0438 04/26/17 0822 04/27/17 0554 04/28/17 0451  WBC 2.8* 4.3 3.8* 3.8* 3.4*  HGB 6.2* 9.7* 9.8* 9.6* 9.5*  HCT 19.7* 30.4* 30.4* 29.6* 30.9*  MCV 71.4* 76.2* 75.4* 76.7* 78.2  PLT 348  369 336 308 340   Cardiac Enzymes: No results for input(s): CKTOTAL, CKMB, CKMBINDEX, TROPONINI in the last 168 hours. BNP: Invalid input(s): POCBNP CBG: Recent Labs  Lab 04/26/17 0523  GLUCAP 111*   D-Dimer No results for input(s): DDIMER in the last 72 hours. Hgb A1c No results for input(s): HGBA1C in the last 72 hours. Lipid Profile No results for input(s): CHOL, HDL, LDLCALC, TRIG, CHOLHDL, LDLDIRECT in the last 72 hours. Thyroid function studies No results for input(s): TSH, T4TOTAL, T3FREE, THYROIDAB in the last 72 hours.  Invalid input(s): FREET3 Anemia work up Recent Labs    04/26/17 0438 04/27/17 1007  VITAMINB12 406  --   FOLATE 12.9  --   FERRITIN  --  93  TIBC  --  340  IRON  --  31  RETICCTPCT  --  0.4   Urinalysis    Component Value Date/Time   COLORURINE COLORLESS (A)  04/22/2016 0038   APPEARANCEUR CLEAR 04/22/2016 0038   LABSPEC 1.004 (L) 04/22/2016 0038   PHURINE 7.0 04/22/2016 0038   GLUCOSEU 50 (A) 04/22/2016 0038   HGBUR NEGATIVE 04/22/2016 0038   BILIRUBINUR NEGATIVE 04/22/2016 0038   KETONESUR NEGATIVE 04/22/2016 0038   PROTEINUR NEGATIVE 04/22/2016 0038   UROBILINOGEN 0.2 10/26/2012 1115   NITRITE NEGATIVE 04/22/2016 0038   LEUKOCYTESUR NEGATIVE 04/22/2016 0038   Sepsis Labs Invalid input(s): PROCALCITONIN,  WBC,  LACTICIDVEN Microbiology No results found for this or any previous visit (from the past 240 hour(s)).   Time coordinating discharge: Over 30 minutes  SIGNED:   Yaakov Guthrie, MD  [facility] 04/28/2017, 1:23 PM Pager [phone]  If 7PM-7AM, please contact night-coverage [email or web address] Password [email or web address]

## 2017-04-28 NOTE — Progress Notes (Signed)
Calorie Count Note  48 hour calorie count ordered.  Per MD notes, pt to be discharged home today.   Diet: Heart Healthy Supplements: Boost Breeze po TID, each supplement provides 250 kcal and 9 grams of protein  04/27/17 Breakfast: 435 kcals, 7 grams protein Lunch: 411 kcals, 27 grams protein Dinner: 419 kcals, 28 grams protein Supplements: 2 Boost Breeze supplements (500 kcals, 18 grams protein)  Total intake: 1765 kcal (>100% of minimum estimated needs)  80 protein (94% of minimum estimated needs)  Nutrition Dx:  Severe Malnutrition related to chronic illness as evidenced by energy intake < 75% for > or equal to 1 month, severe fat depletion, moderate muscle depletion, severe muscle depletion; ongoing  Goal: Patient will meet greater than or equal to 90% of their needs; met  Intervention:   -Continue Boost Breeze po TID, each supplement provides 250 kcal and 9 grams of protein -Continue MVI daily -If PEG is able to be replaced, recommend:   Initiate Jevity 1.2  @ 80 ml/hr via PEG x 12 hours  Recommend 100 ml free water flush 4 times daily  Tube feeding regimen provides 1152 kcal (74% of needs), 53 grams of protein, and 1175 ml of H2O.    Tilda Samudio A. Jimmye Norman, RD, LDN, CDE Pager: [phone] After hours Pager: [phone]

## 2017-04-28 NOTE — Progress Notes (Signed)
Patient discharged home by car accompanied by friend.

## 2017-04-28 NOTE — Progress Notes (Signed)
Progress Note: General Surgery Service   Assessment/Plan: Patient Active Problem List   Diagnosis Date Noted  . Symptomatic Anemia 04/25/2017  . Symptomatic anemia 04/25/2017  . Abnormal loss of weight   . Dysphagia   . S/P percutaneous endoscopic gastrostomy (PEG) tube placement ([place]) 09/06/2016  . At risk for adverse drug event 08/25/2016  . Malnutrition following gastrointestinal surgery 08/16/2016  . Anastomotic ulcer 07/21/2016  . Transient alteration of awareness   . Alcohol use 04/27/2016  . Chronic pain syndrome 04/27/2016  . Iron deficiency anemia 04/27/2016  . B12 deficiency 04/27/2016  . Gastrojejunal anastomotic stricture   . Hypoglycemia 04/22/2016  . Syncope 04/22/2016  . Protein-calorie malnutrition, severe 04/22/2016  . Nausea and vomiting   . Abdominal pain, chronic, epigastric   . Hypotension 02/01/2016  . Hyponatremia 02/01/2016  . Subacromial impingement of left shoulder 07/06/2013  . Trochanteric bursitis of left hip 05/11/2013  . Arthritis 05/16/2012  . S/P gastric bypass 05/16/2012   52 yo female s/p gastric bypass with GJ stenosis s/p dilations, tolerated 1.5l of food yesterday and >1000cal -no plans for procedure during hospitalization -will follow her up outpatient with labs and weights to look more at nutrition -initially had planned for bypass reversal in the next months as she has prolonged length of malnutrition, now patient is preferring GJ revision only. This concerns me that she will have repeat marginal ulcers given her inability to stay on the bariatric diet (avoiding high acid foods like sprite)    LOS: 3 days  Chief Complaint/Subjective: Tolerated food well yesterday  Objective: Vital signs in last 24 hours: Temp:  [97.5 F (36.4 C)-98.2 F (36.8 C)] 97.5 F (36.4 C) (01/03 0453) Pulse Rate:  [72-97] 72 (01/03 0453) Resp:  [18] 18 (01/03 0453) BP: (130-140)/(83-94) 140/87 (01/03 0453) SpO2:  [100 %] 100 % (01/03 0453) Last BM  Date: 04/27/17  Intake/Output from previous day: 01/02 0701 - 01/03 0700 In: 3123.8 [P.O.:1680; I.V.:1443.8] Out: -  Intake/Output this shift: No intake/output data recorded.  Lungs: CTAB  Cardiovascular: RRR  Abd: soft, NT, ND  Neuro: AOx4  Lab Results: CBC  Recent Labs    04/27/17 0554 04/28/17 0451  WBC 3.8* 3.4*  HGB 9.6* 9.5*  HCT 29.6* 30.9*  PLT 308 340   BMET Recent Labs    04/27/17 0554 04/28/17 0451  NA 133* 136  K 4.0 4.5  CL 106 111  CO2 21* 20*  GLUCOSE 62* 74  BUN <5* 6  CREATININE 0.69 0.66  CALCIUM 8.3* 8.2*   PT/INR No results for input(s): LABPROT, INR in the last 72 hours. ABG No results for input(s): PHART, HCO3 in the last 72 hours.  Invalid input(s): PCO2, PO2  Studies/Results:  Anti-infectives: Anti-infectives (From admission, onward)   None      Medications: Scheduled Meds: . feeding supplement  1 Container Oral TID BM  . lidocaine  1 application Topical TID  . loratadine  10 mg Oral Daily  . multivitamin  1 tablet Oral Daily  . oxyCODONE-acetaminophen  1 tablet Oral QID   And  . oxyCODONE  5 mg Oral QID  . pantoprazole  40 mg Oral BID  . potassium chloride  40 mEq Oral Once  . pregabalin  50 mg Oral TID  . QUEtiapine  100 mg Oral QHS  . senna  1 tablet Oral BID  . sodium chloride flush  3 mL Intravenous Q12H  . sucralfate  1 g Oral TID AC  . thiamine  100 mg Oral Daily   Continuous Infusions: . dextrose 5 % and 0.9 % NaCl with KCl 20 mEq/L 75 mL/hr at 04/28/17 0451   PRN Meds:.acetaminophen **OR** acetaminophen, albuterol, ALPRAZolam, morphine injection, nystatin cream, ondansetron **OR** ondansetron (ZOFRAN) IV, oxymetazoline, polyethylene glycol, sodium chloride flush, traZODone  [name], MD Pg# [phone] [facility]

## 2017-04-29 LAB — VITAMIN B1: Vitamin B1 (Thiamine): 60.3 nmol/L — ABNORMAL LOW (ref 66.5–200.0)

## 2017-05-06 ENCOUNTER — Ambulatory Visit: Payer: Self-pay | Admitting: General Surgery

## 2017-05-13 ENCOUNTER — Encounter (HOSPITAL_COMMUNITY): Payer: Self-pay

## 2017-05-13 ENCOUNTER — Encounter (HOSPITAL_COMMUNITY): Admission: RE | Admit: 2017-05-13 | Payer: Medicare Other | Source: Ambulatory Visit

## 2017-05-13 ENCOUNTER — Encounter (HOSPITAL_COMMUNITY)
Admission: RE | Admit: 2017-05-13 | Discharge: 2017-05-13 | Disposition: A | Discharge status: Home / Self Care | Payer: Medicare Other | Source: Ambulatory Visit | Attending: General Surgery | Admitting: General Surgery

## 2017-05-13 ENCOUNTER — Other Ambulatory Visit (HOSPITAL_COMMUNITY): Payer: Self-pay | Admitting: *Deleted

## 2017-05-13 ENCOUNTER — Other Ambulatory Visit: Payer: Self-pay

## 2017-05-13 HISTORY — DX: Reserved for inherently not codable concepts without codable children: IMO0001

## 2017-05-13 HISTORY — DX: Bursopathy, unspecified: M71.9

## 2017-05-13 LAB — CBC WITH DIFFERENTIAL/PLATELET
Basophils Absolute: 0 10*3/uL (ref 0.0–0.1)
Basophils Relative: 1 %
Eosinophils Absolute: 0.1 10*3/uL (ref 0.0–0.7)
Eosinophils Relative: 2 %
HCT: 28.6 % — ABNORMAL LOW (ref 36.0–46.0)
Hemoglobin: 9 g/dL — ABNORMAL LOW (ref 12.0–15.0)
Lymphocytes Relative: 29 %
Lymphs Abs: 1 10*3/uL (ref 0.7–4.0)
MCH: 25.2 pg — ABNORMAL LOW (ref 26.0–34.0)
MCHC: 31.5 g/dL (ref 30.0–36.0)
MCV: 80.1 fL (ref 78.0–100.0)
Monocytes Absolute: 0.3 10*3/uL (ref 0.1–1.0)
Monocytes Relative: 8 %
Neutro Abs: 2.1 10*3/uL (ref 1.7–7.7)
Neutrophils Relative %: 60 %
Platelets: 305 10*3/uL (ref 150–400)
RBC: 3.57 MIL/uL — ABNORMAL LOW (ref 3.87–5.11)
RDW: 22.2 % — ABNORMAL HIGH (ref 11.5–15.5)
WBC: 3.5 10*3/uL — ABNORMAL LOW (ref 4.0–10.5)

## 2017-05-13 LAB — COMPREHENSIVE METABOLIC PANEL
ALT: 8 U/L — ABNORMAL LOW (ref 14–54)
AST: 16 U/L (ref 15–41)
Albumin: 3.3 g/dL — ABNORMAL LOW (ref 3.5–5.0)
Alkaline Phosphatase: 72 U/L (ref 38–126)
Anion gap: 3 — ABNORMAL LOW (ref 5–15)
BUN: 13 mg/dL (ref 6–20)
CO2: 25 mmol/L (ref 22–32)
Calcium: 8.5 mg/dL — ABNORMAL LOW (ref 8.9–10.3)
Chloride: 110 mmol/L (ref 101–111)
Creatinine, Ser: 0.57 mg/dL (ref 0.44–1.00)
GFR calc Af Amer: >60 mL/min (ref >60)
GFR calc non Af Amer: >60 mL/min (ref >60)
Glucose, Bld: 70 mg/dL (ref 65–99)
Potassium: 4.3 mmol/L (ref 3.5–5.1)
Sodium: 138 mmol/L (ref 135–145)
Total Bilirubin: 0.5 mg/dL (ref 0.3–1.2)
Total Protein: 5.9 g/dL — ABNORMAL LOW (ref 6.5–8.1)

## 2017-05-13 NOTE — Progress Notes (Signed)
Spoke with dr Ambrose Pancoast anesthesia no ekg or diabetic protocol needed with pre op visit today after reviewing patient medical history with dr Ambrose Pancoast.

## 2017-05-13 NOTE — Patient Instructions (Addendum)
Angie [name]  05/13/2017   Your procedure is scheduled on: 05-16-17  Report to [facility] Main  Entrance   Follow signs to Short Stay on first floor at 530 AM  Call this number if you have problems the morning of surgery [phone]    DRINK 2 PRESURGERY ENSURE DRINKS THE NIGHT BEFORE SURGERY AT  1000 PM AND 1 PRESURGERY DRINK THE DAY OF THE PROCEDURE 3 HOURS PRIOR TO SCHEDULED SURGERY. NO SOLID FOOD  AFTER MIDNIGHT THE DAY PRIOR TO THE SURGERY. NOTHING BY MOUTH EXCEPT CLEAR LIQUIDS UNTIL THREE HOURS PRIOR TO SCHEDULED SURGERY. PLEASE FINISH PRESURGERY ENSURE DRINK PER SURGEON ORDER 3 HOURS PRIOR TO SCHEDULED SURGERY TIME WHICH NEEDS TO BE COMPLETED AT 430 AM.     CLEAR LIQUID DIET   Foods Allowed                                                                     Foods Excluded  Coffee and tea, regular and decaf                             liquids that you cannot  Plain Jell-O in any flavor                                             see through such as: Fruit ices (not with fruit pulp)                                     milk, soups, orange juice  Iced Popsicles                                    All solid food Carbonated beverages, regular and diet                                    Cranberry, grape and apple juices Sports drinks like Gatorade Lightly seasoned clear broth or consume(fat free) Sugar, honey syrup  Sample Menu Breakfast                                Lunch                                     Supper Cranberry juice                    Beef broth                            Chicken broth Jell-O  Grape juice                           Apple juice Coffee or tea                        Jell-O                                      Popsicle                                                Coffee or tea                        Coffee or  tea  _____________________________________________________________________      Take these medicines the morning of surgery with A SIP OF WATER: OXYCODONE, ALPRAZOLAM (XANAX) PREGABALAN (LYRICA), ZOFRAN                               You may not have any metal on your body including hair pins and              piercings  Do not wear jewelry, make-up, lotions, powders or perfumes, deodorant             Do not wear nail polish.  Do not shave  48 hours prior to surgery.              Men may shave face and neck.   Do not bring valuables to the hospital. [facility].  Contacts, dentures or bridgework may not be worn into surgery.  Leave suitcase in the car. After surgery it may be brought to your room.      [facility] - Preparing for Surgery Before surgery, you can play an important role.  Because skin is not sterile, your skin needs to be as free of germs as possible.  You can reduce the number of germs on your skin by washing with CHG (chlorahexidine gluconate) soap before surgery.  CHG is an antiseptic cleaner which kills germs and bonds with the skin to continue killing germs even after washing. Please DO NOT use if you have an allergy to CHG or antibacterial soaps.  If your skin becomes reddened/irritated stop using the CHG and inform your nurse when you arrive at Short Stay. Do not shave (including legs and underarms) for at least 48 hours prior to the first CHG shower.  You may shave your face/neck. Please follow these instructions carefully:  1.  Shower with CHG Soap the night before surgery and the  morning of Surgery.  2.  If you choose to wash your hair, wash your hair first as usual with your  normal  shampoo.  3.  After you shampoo, rinse your hair and body thoroughly to remove the  shampoo.                           4.  Use CHG as you would any other liquid soap.  You can apply chg directly  to the skin and wash                        Gently with a scrungie or clean washcloth.  5.  Apply the CHG Soap to your body ONLY FROM THE NECK DOWN.   Do not use on face/ open                           Wound or open sores. Avoid contact with eyes, ears mouth and genitals (private parts).                       Wash face,  Genitals (private parts) with your normal soap.             6.  Wash thoroughly, paying special attention to the area where your surgery  will be performed.  7.  Thoroughly rinse your body with warm water from the neck down.  8.  DO NOT shower/wash with your normal soap after using and rinsing off  the CHG Soap.                9.  Pat yourself dry with a clean towel.            10.  Wear clean pajamas.            11.  Place clean sheets on your bed the night of your first shower and do not  sleep with pets. Day of Surgery : Do not apply any lotions/deodorants the morning of surgery.  Please wear clean clothes to the hospital/surgery center.  FAILURE TO FOLLOW THESE INSTRUCTIONS MAY RESULT IN THE CANCELLATION OF YOUR SURGERY PATIENT SIGNATURE_________________________________  NURSE SIGNATURE__________________________________  ________________________________________________________________________   Angie [name]  An incentive spirometer is a tool that can help keep your lungs clear and active. This tool measures how well you are filling your lungs with each breath. Taking long deep breaths may help reverse or decrease the chance of developing breathing (pulmonary) problems (especially infection) following:  A long period of time when you are unable to move or be active. BEFORE THE PROCEDURE   If the spirometer includes an indicator to show your best effort, your nurse or respiratory therapist will set it to a desired goal.  If possible, sit up straight or lean slightly forward. Try not to slouch.  Hold the incentive spirometer in an upright position. INSTRUCTIONS FOR USE  1. Sit on the edge of your bed  if possible, or sit up as far as you can in bed or on a chair. 2. Hold the incentive spirometer in an upright position. 3. Breathe out normally. 4. Place the mouthpiece in your mouth and seal your lips tightly around it. 5. Breathe in slowly and as deeply as possible, raising the piston or the ball toward the top of the column. 6. Hold your breath for 3-5 seconds or for as long as possible. Allow the piston or ball to fall to the bottom of the column. 7. Remove the mouthpiece from your mouth and breathe out normally. 8. Rest for a few seconds and repeat Steps 1 through 7 at least 10 times every 1-2 hours when you are awake. Take your time and take a few normal breaths between deep breaths. 9. The spirometer may include an indicator to show your best effort. Use the indicator as a goal to work  toward during each repetition. 10. After each set of 10 deep breaths, practice coughing to be sure your lungs are clear. If you have an incision (the cut made at the time of surgery), support your incision when coughing by placing a pillow or rolled up towels firmly against it. Once you are able to get out of bed, walk around indoors and cough well. You may stop using the incentive spirometer when instructed by your caregiver.  RISKS AND COMPLICATIONS  Take your time so you do not get dizzy or light-headed.  If you are in pain, you may need to take or ask for pain medication before doing incentive spirometry. It is harder to take a deep breath if you are having pain. AFTER USE  Rest and breathe slowly and easily.  It can be helpful to keep track of a log of your progress. Your caregiver can provide you with a simple table to help with this. If you are using the spirometer at home, follow these instructions: [place] IF:   You are having difficultly using the spirometer.  You have trouble using the spirometer as often as instructed.  Your pain medication is not giving enough relief while  using the spirometer.  You develop fever of 100.5 F (38.1 C) or higher. SEEK IMMEDIATE MEDICAL CARE IF:   You cough up bloody sputum that had not been present before.  You develop fever of 102 F (38.9 C) or greater.  You develop worsening pain at or near the incision site. MAKE SURE YOU:   Understand these instructions.  Will watch your condition.  Will get help right away if you are not doing well or get worse. Document Released: 08/23/2006 Document Revised: 07/05/2011 Document Reviewed: 10/24/2006 Exit[employer] Patient Information 2014 [employer], [place].   ________________________________________________________________________  WHAT IS A BLOOD TRANSFUSION? Blood Transfusion Information  A transfusion is the replacement of blood or some of its parts. Blood is made up of multiple cells which provide different functions.  Red blood cells carry oxygen and are used for blood loss replacement.  White blood cells fight against infection.  Platelets control bleeding.  Plasma helps clot blood.  Other blood products are available for specialized needs, such as hemophilia or other clotting disorders. BEFORE THE TRANSFUSION  Who gives blood for transfusions?   Healthy volunteers who are fully evaluated to make sure their blood is safe. This is blood bank blood. Transfusion therapy is the safest it has ever been in the practice of medicine. Before blood is taken from a donor, a complete history is taken to make sure that person has no history of diseases nor engages in risky social behavior (examples are intravenous drug use or sexual activity with multiple partners). The donor's travel history is screened to minimize risk of transmitting infections, such as malaria. The donated blood is tested for signs of infectious diseases, such as HIV and hepatitis. The blood is then tested to be sure it is compatible with you in order to minimize the chance of a transfusion reaction. If you or a  relative donates blood, this is often done in anticipation of surgery and is not appropriate for emergency situations. It takes many days to process the donated blood. RISKS AND COMPLICATIONS Although transfusion therapy is very safe and saves many lives, the main dangers of transfusion include:   Getting an infectious disease.  Developing a transfusion reaction. This is an allergic reaction to something in the blood you were given. Every precaution is taken  to prevent this. The decision to have a blood transfusion has been considered carefully by your caregiver before blood is given. Blood is not given unless the benefits outweigh the risks. AFTER THE TRANSFUSION  Right after receiving a blood transfusion, you will usually feel much better and more energetic. This is especially true if your red blood cells have gotten low (anemic). The transfusion raises the level of the red blood cells which carry oxygen, and this usually causes an energy increase.  The nurse administering the transfusion will monitor you carefully for complications. HOME CARE INSTRUCTIONS  No special instructions are needed after a transfusion. You may find your energy is better. Speak with your caregiver about any limitations on activity for underlying diseases you may have. SEEK MEDICAL CARE IF:   Your condition is not improving after your transfusion.  You develop redness or irritation at the intravenous (IV) site. SEEK IMMEDIATE MEDICAL CARE IF:  Any of the following symptoms occur over the next 12 hours:  Shaking chills.  You have a temperature by mouth above 102 F (38.9 C), not controlled by medicine.  Chest, back, or muscle pain.  People around you feel you are not acting correctly or are confused.  Shortness of breath or difficulty breathing.  Dizziness and fainting.  You get a rash or develop hives.  You have a decrease in urine output.  Your urine turns a dark color or changes to pink, red, or  brown. Any of the following symptoms occur over the next 10 days:  You have a temperature by mouth above 102 F (38.9 C), not controlled by medicine.  Shortness of breath.  Weakness after normal activity.  The white part of the eye turns yellow (jaundice).  You have a decrease in the amount of urine or are urinating less often.  Your urine turns a dark color or changes to pink, red, or brown. Document Released: 04/09/2000 Document Revised: 07/05/2011 Document Reviewed: 11/27/2007 [facility] Patient Information 2014 [place], [place].  _______________________________________________________________________

## 2017-05-13 NOTE — Progress Notes (Signed)
Cbc with dif resul routed to dr Georgia Dom inbasket by epic

## 2017-05-16 ENCOUNTER — Encounter (HOSPITAL_COMMUNITY): Payer: Self-pay | Admitting: *Deleted

## 2017-05-16 ENCOUNTER — Inpatient Hospital Stay (HOSPITAL_COMMUNITY): Payer: Medicare Other | Admitting: Registered Nurse

## 2017-05-16 ENCOUNTER — Inpatient Hospital Stay (HOSPITAL_COMMUNITY)
Admission: RE | Admit: 2017-05-16 | Discharge: 2017-05-25 | DRG: 326 | Disposition: A | Discharge status: Skilled Nursing Facility | Payer: Medicare Other | Source: Ambulatory Visit | Attending: General Surgery | Admitting: General Surgery

## 2017-05-16 ENCOUNTER — Encounter (HOSPITAL_COMMUNITY)
Admission: RE | Disposition: A | Discharge status: Skilled Nursing Facility | Payer: Self-pay | Source: Ambulatory Visit | Attending: General Surgery

## 2017-05-16 DIAGNOSIS — K9589 Other complications of other bariatric procedure: Secondary | ICD-10-CM | POA: Diagnosis present

## 2017-05-16 DIAGNOSIS — Z833 Family history of diabetes mellitus: Secondary | ICD-10-CM

## 2017-05-16 DIAGNOSIS — Z87891 Personal history of nicotine dependence: Secondary | ICD-10-CM | POA: Diagnosis not present

## 2017-05-16 DIAGNOSIS — E43 Unspecified severe protein-calorie malnutrition: Secondary | ICD-10-CM | POA: Diagnosis present

## 2017-05-16 DIAGNOSIS — K289 Gastrojejunal ulcer, unspecified as acute or chronic, without hemorrhage or perforation: Secondary | ICD-10-CM | POA: Diagnosis present

## 2017-05-16 DIAGNOSIS — Z888 Allergy status to other drugs, medicaments and biological substances status: Secondary | ICD-10-CM | POA: Diagnosis not present

## 2017-05-16 DIAGNOSIS — Z79891 Long term (current) use of opiate analgesic: Secondary | ICD-10-CM | POA: Diagnosis not present

## 2017-05-16 DIAGNOSIS — F419 Anxiety disorder, unspecified: Secondary | ICD-10-CM | POA: Diagnosis present

## 2017-05-16 DIAGNOSIS — K3189 Other diseases of stomach and duodenum: Secondary | ICD-10-CM

## 2017-05-16 DIAGNOSIS — K9509 Other complications of gastric band procedure: Secondary | ICD-10-CM

## 2017-05-16 DIAGNOSIS — Z8249 Family history of ischemic heart disease and other diseases of the circulatory system: Secondary | ICD-10-CM | POA: Diagnosis not present

## 2017-05-16 DIAGNOSIS — Y838 Other surgical procedures as the cause of abnormal reaction of the patient, or of later complication, without mention of misadventure at the time of the procedure: Secondary | ICD-10-CM | POA: Diagnosis present

## 2017-05-16 DIAGNOSIS — K920 Hematemesis: Secondary | ICD-10-CM | POA: Diagnosis present

## 2017-05-16 DIAGNOSIS — Z6820 Body mass index (BMI) 20.0-20.9, adult: Secondary | ICD-10-CM | POA: Diagnosis not present

## 2017-05-16 HISTORY — PX: LAPAROSCOPIC REVISION OF GASTROJEJUNOSTOMY: SHX5922

## 2017-05-16 HISTORY — PX: GASTROSTOMY: SHX5249

## 2017-05-16 LAB — TYPE AND SCREEN
ABO/RH(D): B POS
Antibody Screen: NEGATIVE

## 2017-05-16 LAB — ABO/RH: ABO/RH(D): B POS

## 2017-05-16 LAB — CBC
HCT: 32.1 % — ABNORMAL LOW (ref 36.0–46.0)
Hemoglobin: 10.1 g/dL — ABNORMAL LOW (ref 12.0–15.0)
MCH: 25.4 pg — ABNORMAL LOW (ref 26.0–34.0)
MCHC: 31.5 g/dL (ref 30.0–36.0)
MCV: 80.7 fL (ref 78.0–100.0)
Platelets: 310 10*3/uL (ref 150–400)
RBC: 3.98 MIL/uL (ref 3.87–5.11)
RDW: 22.3 % — ABNORMAL HIGH (ref 11.5–15.5)
WBC: 8.6 10*3/uL (ref 4.0–10.5)

## 2017-05-16 LAB — CREATININE, SERUM
Creatinine, Ser: 0.68 mg/dL (ref 0.44–1.00)
GFR calc Af Amer: >60 mL/min (ref >60)
GFR calc non Af Amer: >60 mL/min (ref >60)

## 2017-05-16 LAB — GLUCOSE, CAPILLARY
Glucose-Capillary: 179 mg/dL — ABNORMAL HIGH (ref 65–99)
Glucose-Capillary: 185 mg/dL — ABNORMAL HIGH (ref 65–99)
Glucose-Capillary: 177 mg/dL — ABNORMAL HIGH (ref 65–99)
Glucose-Capillary: 117 mg/dL — ABNORMAL HIGH (ref 65–99)

## 2017-05-16 SURGERY — REVISION, ANASTOMOSIS, GASTROJEJUNAL, LAPAROSCOPIC
Anesthesia: General | Site: Abdomen

## 2017-05-16 MED ORDER — LABETALOL HCL 5 MG/ML IV SOLN
INTRAVENOUS | Status: AC
  Filled 2017-05-16: qty 4

## 2017-05-16 MED ORDER — CHLORHEXIDINE GLUCONATE 4 % EX LIQD: 60.0000 mL | Freq: Once | CUTANEOUS | Status: DC

## 2017-05-16 MED ORDER — DEXAMETHASONE SODIUM PHOSPHATE 10 MG/ML IJ SOLN
INTRAMUSCULAR | Status: DC | PRN
  Administered 2017-05-16: 10 mg via INTRAVENOUS

## 2017-05-16 MED ORDER — ENSURE PRE-SURGERY PO LIQD
592.0000 mL | Freq: Once | ORAL | Status: AC
  Administered 2017-05-16: 592 mL via ORAL
  Filled 2017-05-16: qty 592

## 2017-05-16 MED ORDER — ACETAMINOPHEN 500 MG PO TABS: 1000.0000 mg | ORAL_TABLET | ORAL | Status: DC

## 2017-05-16 MED ORDER — ROCURONIUM BROMIDE 50 MG/5ML IV SOSY
PREFILLED_SYRINGE | INTRAVENOUS | Status: AC
  Filled 2017-05-16: qty 5

## 2017-05-16 MED ORDER — TISSEEL VH 10 ML EX KIT
PACK | CUTANEOUS | Status: AC
  Filled 2017-05-16: qty 1

## 2017-05-16 MED ORDER — ONDANSETRON HCL 4 MG/2ML IJ SOLN: 4.0000 mg | INTRAMUSCULAR | Status: DC | PRN

## 2017-05-16 MED ORDER — MORPHINE SULFATE (PF) 2 MG/ML IV SOLN
1.0000 mg | INTRAVENOUS | Status: DC | PRN
  Administered 2017-05-16: 1 mg via INTRAVENOUS
  Administered 2017-05-16 – 2017-05-17 (×2): 2 mg via INTRAVENOUS
  Filled 2017-05-16 (×5): qty 1

## 2017-05-16 MED ORDER — GABAPENTIN 300 MG PO CAPS
300.0000 mg | ORAL_CAPSULE | ORAL | Status: AC
  Administered 2017-05-16: 300 mg via ORAL
  Filled 2017-05-16: qty 1

## 2017-05-16 MED ORDER — DEXAMETHASONE SODIUM PHOSPHATE 4 MG/ML IJ SOLN: 4.0000 mg | INTRAMUSCULAR | Status: DC

## 2017-05-16 MED ORDER — INSULIN ASPART 100 UNIT/ML ~~LOC~~ SOLN
0.0000 [IU] | Freq: Three times a day (TID) | SUBCUTANEOUS | Status: DC
  Administered 2017-05-16: 2 [IU] via SUBCUTANEOUS
  Administered 2017-05-18 – 2017-05-21 (×2): 1 [IU] via SUBCUTANEOUS
  Administered 2017-05-22 (×2): 2 [IU] via SUBCUTANEOUS

## 2017-05-16 MED ORDER — HYDRALAZINE HCL 20 MG/ML IJ SOLN
10.0000 mg | Freq: Once | INTRAMUSCULAR | Status: AC
  Administered 2017-05-16: 10 mg via INTRAVENOUS

## 2017-05-16 MED ORDER — SCOPOLAMINE 1 MG/3DAYS TD PT72
1.0000 | MEDICATED_PATCH | TRANSDERMAL | Status: DC
  Administered 2017-05-16: 1.5 mg via TRANSDERMAL
  Filled 2017-05-16: qty 1

## 2017-05-16 MED ORDER — MIDAZOLAM HCL 2 MG/2ML IJ SOLN
INTRAMUSCULAR | Status: AC
  Filled 2017-05-16: qty 2

## 2017-05-16 MED ORDER — LIDOCAINE 2% (20 MG/ML) 5 ML SYRINGE
INTRAMUSCULAR | Status: DC | PRN
  Administered 2017-05-16: 60 mg via INTRAVENOUS

## 2017-05-16 MED ORDER — CEFOTETAN DISODIUM-DEXTROSE 2-2.08 GM-%(50ML) IV SOLR
INTRAVENOUS | Status: AC
  Filled 2017-05-16: qty 50

## 2017-05-16 MED ORDER — JEVITY 1.2 CAL PO LIQD
1000.0000 mL | ORAL | Status: DC
  Administered 2017-05-16: 1000 mL
  Filled 2017-05-16: qty 1000

## 2017-05-16 MED ORDER — SUGAMMADEX SODIUM 200 MG/2ML IV SOLN
INTRAVENOUS | Status: DC | PRN
  Administered 2017-05-16: 200 mg via INTRAVENOUS

## 2017-05-16 MED ORDER — FENTANYL CITRATE (PF) 100 MCG/2ML IJ SOLN
INTRAMUSCULAR | Status: AC
  Filled 2017-05-16: qty 2

## 2017-05-16 MED ORDER — CEFOTETAN DISODIUM-DEXTROSE 2-2.08 GM-%(50ML) IV SOLR
2.0000 g | INTRAVENOUS | Status: AC
  Administered 2017-05-16: 2 g via INTRAVENOUS
  Filled 2017-05-16: qty 50

## 2017-05-16 MED ORDER — FENTANYL CITRATE (PF) 100 MCG/2ML IJ SOLN: 50.0000 ug | INTRAMUSCULAR | Status: DC | PRN

## 2017-05-16 MED ORDER — FENTANYL CITRATE (PF) 100 MCG/2ML IJ SOLN
INTRAMUSCULAR | Status: DC | PRN
  Administered 2017-05-16 (×3): 50 ug via INTRAVENOUS

## 2017-05-16 MED ORDER — MIDAZOLAM HCL 5 MG/5ML IJ SOLN
INTRAMUSCULAR | Status: DC | PRN
  Administered 2017-05-16: 2 mg via INTRAVENOUS

## 2017-05-16 MED ORDER — ACETAMINOPHEN 10 MG/ML IV SOLN
1000.0000 mg | Freq: Four times a day (QID) | INTRAVENOUS | Status: AC
  Administered 2017-05-16 – 2017-05-17 (×4): 1000 mg via INTRAVENOUS
  Filled 2017-05-16 (×4): qty 100

## 2017-05-16 MED ORDER — KCL IN DEXTROSE-NACL 20-5-0.45 MEQ/L-%-% IV SOLN
INTRAVENOUS | Status: DC
  Administered 2017-05-16 – 2017-05-23 (×6): via INTRAVENOUS
  Filled 2017-05-16 (×10): qty 1000

## 2017-05-16 MED ORDER — SUGAMMADEX SODIUM 200 MG/2ML IV SOLN
INTRAVENOUS | Status: AC
  Filled 2017-05-16: qty 2

## 2017-05-16 MED ORDER — LACTATED RINGERS IR SOLN
Status: DC | PRN
  Administered 2017-05-16: 2000 mL

## 2017-05-16 MED ORDER — TISSEEL VH 10 ML EX KIT
PACK | CUTANEOUS | Status: DC | PRN
  Administered 2017-05-16: 1

## 2017-05-16 MED ORDER — LIDOCAINE 2% (20 MG/ML) 5 ML SYRINGE
INTRAMUSCULAR | Status: DC | PRN
  Administered 2017-05-16: 1 mg/kg/h via INTRAVENOUS

## 2017-05-16 MED ORDER — ENSURE SURGERY PO LIQD
237.0000 mL | Freq: Two times a day (BID) | ORAL | Status: DC
  Filled 2017-05-16: qty 237

## 2017-05-16 MED ORDER — BUPIVACAINE HCL (PF) 0.25 % IJ SOLN
INTRAMUSCULAR | Status: AC
  Filled 2017-05-16: qty 30

## 2017-05-16 MED ORDER — LIDOCAINE 2% (20 MG/ML) 5 ML SYRINGE
INTRAMUSCULAR | Status: AC
  Filled 2017-05-16: qty 5

## 2017-05-16 MED ORDER — ONDANSETRON HCL 4 MG/2ML IJ SOLN
INTRAMUSCULAR | Status: DC | PRN
  Administered 2017-05-16: 4 mg via INTRAVENOUS

## 2017-05-16 MED ORDER — LACTATED RINGERS IV SOLN
INTRAVENOUS | Status: DC
  Administered 2017-05-16: 07:00:00 via INTRAVENOUS

## 2017-05-16 MED ORDER — OXYCODONE HCL 5 MG PO TABS: 5.0000 mg | ORAL_TABLET | Freq: Once | ORAL | Status: DC | PRN

## 2017-05-16 MED ORDER — LACTATED RINGERS IV SOLN
INTRAVENOUS | Status: DC | PRN
  Administered 2017-05-16: 07:00:00 via INTRAVENOUS

## 2017-05-16 MED ORDER — OXYCODONE HCL 5 MG/5ML PO SOLN
5.0000 mg | Freq: Once | ORAL | Status: DC | PRN
  Filled 2017-05-16: qty 5

## 2017-05-16 MED ORDER — HEPARIN SODIUM (PORCINE) 5000 UNIT/ML IJ SOLN
5000.0000 [IU] | INTRAMUSCULAR | Status: AC
  Administered 2017-05-16: 5000 [IU] via SUBCUTANEOUS
  Filled 2017-05-16: qty 1

## 2017-05-16 MED ORDER — ROCURONIUM BROMIDE 10 MG/ML (PF) SYRINGE
PREFILLED_SYRINGE | INTRAVENOUS | Status: DC | PRN
  Administered 2017-05-16: 10 mg via INTRAVENOUS
  Administered 2017-05-16: 40 mg via INTRAVENOUS
  Administered 2017-05-16: 10 mg via INTRAVENOUS
  Administered 2017-05-16: 5 mg via INTRAVENOUS

## 2017-05-16 MED ORDER — ACETAMINOPHEN 500 MG PO TABS
1000.0000 mg | ORAL_TABLET | ORAL | Status: AC
  Administered 2017-05-16: 1000 mg via ORAL
  Filled 2017-05-16: qty 2

## 2017-05-16 MED ORDER — METHOCARBAMOL 1000 MG/10ML IJ SOLN
500.0000 mg | Freq: Four times a day (QID) | INTRAVENOUS | Status: DC | PRN
  Administered 2017-05-17: 500 mg via INTRAVENOUS
  Filled 2017-05-16: qty 5
  Filled 2017-05-16: qty 550

## 2017-05-16 MED ORDER — PREMIER PROTEIN SHAKE: 2.0000 [oz_av] | ORAL | Status: DC

## 2017-05-16 MED ORDER — PROPOFOL 10 MG/ML IV BOLUS
INTRAVENOUS | Status: DC | PRN
  Administered 2017-05-16: 100 mg via INTRAVENOUS

## 2017-05-16 MED ORDER — ENOXAPARIN SODIUM 30 MG/0.3ML ~~LOC~~ SOLN
30.0000 mg | Freq: Two times a day (BID) | SUBCUTANEOUS | Status: DC
  Administered 2017-05-17 – 2017-05-24 (×15): 30 mg via SUBCUTANEOUS
  Filled 2017-05-16 (×15): qty 0.3

## 2017-05-16 MED ORDER — PANTOPRAZOLE SODIUM 40 MG IV SOLR
40.0000 mg | Freq: Every day | INTRAVENOUS | Status: DC
  Administered 2017-05-16 – 2017-05-19 (×4): 40 mg via INTRAVENOUS
  Filled 2017-05-16 (×4): qty 40

## 2017-05-16 MED ORDER — HYDRALAZINE HCL 20 MG/ML IJ SOLN
INTRAMUSCULAR | Status: AC
  Filled 2017-05-16: qty 1

## 2017-05-16 MED ORDER — HYDROMORPHONE HCL 1 MG/ML IJ SOLN
0.5000 mg | INTRAMUSCULAR | Status: DC | PRN
  Administered 2017-05-16: 0.5 mg via INTRAVENOUS
  Administered 2017-05-17 – 2017-05-18 (×11): 1 mg via INTRAVENOUS
  Filled 2017-05-16 (×13): qty 1

## 2017-05-16 MED ORDER — GABAPENTIN 300 MG PO CAPS: 300.0000 mg | ORAL_CAPSULE | ORAL | Status: DC

## 2017-05-16 MED ORDER — PROPOFOL 10 MG/ML IV BOLUS
INTRAVENOUS | Status: AC
  Filled 2017-05-16: qty 20

## 2017-05-16 MED ORDER — FENTANYL CITRATE (PF) 100 MCG/2ML IJ SOLN
INTRAMUSCULAR | Status: AC
Start: 2017-05-16 — End: ?
  Filled 2017-05-16: qty 2

## 2017-05-16 MED ORDER — DEXAMETHASONE SODIUM PHOSPHATE 10 MG/ML IJ SOLN
INTRAMUSCULAR | Status: AC
  Filled 2017-05-16: qty 1

## 2017-05-16 MED ORDER — SCOPOLAMINE 1 MG/3DAYS TD PT72: 1.0000 | MEDICATED_PATCH | TRANSDERMAL | Status: DC

## 2017-05-16 MED ORDER — LABETALOL HCL 5 MG/ML IV SOLN
10.0000 mg | Freq: Once | INTRAVENOUS | Status: AC
  Administered 2017-05-16: 10 mg via INTRAVENOUS

## 2017-05-16 MED ORDER — ONDANSETRON HCL 4 MG/2ML IJ SOLN: 4.0000 mg | Freq: Four times a day (QID) | INTRAMUSCULAR | Status: DC | PRN

## 2017-05-16 MED ORDER — BUPIVACAINE LIPOSOME 1.3 % IJ SUSP
20.0000 mL | Freq: Once | INTRAMUSCULAR | Status: AC
  Administered 2017-05-16: 20 mL
  Filled 2017-05-16: qty 20

## 2017-05-16 MED ORDER — LIDOCAINE 2% (20 MG/ML) 5 ML SYRINGE
INTRAMUSCULAR | Status: AC
  Filled 2017-05-16: qty 10

## 2017-05-16 MED ORDER — ENSURE PRE-SURGERY PO LIQD
296.0000 mL | Freq: Once | ORAL | Status: DC
  Filled 2017-05-16: qty 296

## 2017-05-16 MED ORDER — ONDANSETRON HCL 4 MG/2ML IJ SOLN
INTRAMUSCULAR | Status: AC
  Filled 2017-05-16: qty 2

## 2017-05-16 MED ORDER — APREPITANT 40 MG PO CAPS
40.0000 mg | ORAL_CAPSULE | ORAL | Status: AC
  Administered 2017-05-16: 40 mg via ORAL
  Filled 2017-05-16: qty 1

## 2017-05-16 MED ORDER — 0.9 % SODIUM CHLORIDE (POUR BTL) OPTIME
TOPICAL | Status: DC | PRN
  Administered 2017-05-16: 1000 mL

## 2017-05-16 MED ORDER — BUPIVACAINE HCL 0.25 % IJ SOLN
INTRAMUSCULAR | Status: DC | PRN
Start: 2017-05-16 — End: 2017-05-16
  Administered 2017-05-16: 30 mL

## 2017-05-16 SURGICAL SUPPLY — 87 items
APL SKNCLS STERI-STRIP NONHPOA (GAUZE/BANDAGES/DRESSINGS) ×2
APL SRG 32X5 SNPLK LF DISP (MISCELLANEOUS) ×2
APPLIER CLIP 5 13 M/L LIGAMAX5 (MISCELLANEOUS) IMPLANT
APPLIER CLIP ROT 10 11.4 M/L (STAPLE) IMPLANT
APPLIER CLIP ROT 13.4 12 LRG (CLIP) IMPLANT
APR CLP LRG 13.4X12 ROT 20 MLT (CLIP)
APR CLP MED LRG 11.4X10 (STAPLE)
APR CLP MED LRG 5 ANG JAW (MISCELLANEOUS)
BAG LAPAROSCOPIC 12 15 PORT 16 (BASKET) IMPLANT
BAG RETRIEVAL 12/15 (BASKET) ×3 IMPLANT
BAG RETRIEVAL 12/15MM (BASKET) ×1
BANDAGE ADH SHEER 1  50/CT (GAUZE/BANDAGES/DRESSINGS) ×2 IMPLANT
BENZOIN TINCTURE PRP APPL 2/3 (GAUZE/BANDAGES/DRESSINGS) ×2 IMPLANT
BLADE SURG SZ11 CARB STEEL (BLADE) ×4 IMPLANT
CABLE HIGH FREQUENCY MONO STRZ (ELECTRODE) ×4 IMPLANT
CATH GASTROSTOMY 24FR (CATHETERS) ×4 IMPLANT
CHLORAPREP W/TINT 26ML (MISCELLANEOUS) ×4 IMPLANT
CLIP APPLIE 5 13 M/L LIGAMAX5 (MISCELLANEOUS) IMPLANT
CLIP APPLIE ROT 10 11.4 M/L (STAPLE) IMPLANT
CLIP APPLIE ROT 13.4 12 LRG (CLIP) IMPLANT
CLOSURE WOUND 1/2 X4 (GAUZE/BANDAGES/DRESSINGS) ×1
COVER SURGICAL LIGHT HANDLE (MISCELLANEOUS) ×4 IMPLANT
DEVICE SUTURE ENDOST 10MM (ENDOMECHANICALS) ×4 IMPLANT
DRAIN CHANNEL 19F RND (DRAIN) ×2 IMPLANT
DRAIN PENROSE 18X1/4 LTX STRL (WOUND CARE) ×4 IMPLANT
ELECT L-HOOK LAP 45CM DISP (ELECTROSURGICAL) ×4 IMPLANT
ELECT PENCIL ROCKER SW 15FT (MISCELLANEOUS) ×4 IMPLANT
ELECTRODE L-HOOK LAP 45CM DISP (ELECTROSURGICAL) ×2 IMPLANT
EVACUATOR SILICONE 100CC (DRAIN) ×2 IMPLANT
G-TUBE MIC 24F 7-10 BALLOON (CATHETERS) IMPLANT
GAUZE SPONGE 4X4 12PLY STRL (GAUZE/BANDAGES/DRESSINGS) ×2 IMPLANT
GAUZE SPONGE 4X4 16PLY XRAY LF (GAUZE/BANDAGES/DRESSINGS) ×4 IMPLANT
GLOVE BIOGEL PI IND STRL 7.0 (GLOVE) ×2 IMPLANT
GLOVE BIOGEL PI INDICATOR 7.0 (GLOVE) ×2
GLOVE SURG SS PI 7.0 STRL IVOR (GLOVE) ×4 IMPLANT
GOWN STRL REUS W/TWL LRG LVL3 (GOWN DISPOSABLE) ×4 IMPLANT
GOWN STRL REUS W/TWL XL LVL3 (GOWN DISPOSABLE) ×12 IMPLANT
GRASPER SUT TROCAR 14GX15 (MISCELLANEOUS) IMPLANT
HANDLE STAPLE EGIA 4 XL (STAPLE) ×4 IMPLANT
IV CATH 14GX2 1/4 (CATHETERS) ×4 IMPLANT
KIT BASIN OR (CUSTOM PROCEDURE TRAY) ×4 IMPLANT
KIT GASTRIC LAVAGE 34FR ADT (SET/KITS/TRAYS/PACK) ×2 IMPLANT
MARKER SKIN DUAL TIP RULER LAB (MISCELLANEOUS) ×4 IMPLANT
NDL SPNL 22GX3.5 QUINCKE BK (NEEDLE) ×2 IMPLANT
NEEDLE SPNL 22GX3.5 QUINCKE BK (NEEDLE) ×4 IMPLANT
PACK CARDIOVASCULAR III (CUSTOM PROCEDURE TRAY) ×4 IMPLANT
RELOAD EGIA 45 MED/THCK PURPLE (STAPLE) ×6 IMPLANT
RELOAD EGIA 45 TAN VASC (STAPLE) IMPLANT
RELOAD EGIA 60 MED/THCK PURPLE (STAPLE) ×4 IMPLANT
RELOAD EGIA 60 TAN VASC (STAPLE) ×8 IMPLANT
RELOAD ENDO STITCH 2.0 (ENDOMECHANICALS) ×24
RELOAD STAPLE 60 MED/THCK ART (STAPLE) ×8 IMPLANT
RELOAD SUT SNGL STCH ABSRB 2-0 (ENDOMECHANICALS) ×10 IMPLANT
RELOAD SUT SNGL STCH BLK 2-0 (ENDOMECHANICALS) ×10 IMPLANT
SCISSORS METZENBAUM CVD 44CM (INSTRUMENTS) ×4 IMPLANT
SEALANT SURGICAL APPL DUAL CAN (MISCELLANEOUS) ×2 IMPLANT
SHEARS HARMONIC ACE PLUS 45CM (MISCELLANEOUS) ×4 IMPLANT
SHEATH URETERAL 12FRX35CM (MISCELLANEOUS) ×2 IMPLANT
SLEEVE XCEL OPT CAN 5 100 (ENDOMECHANICALS) ×12 IMPLANT
SOLUTION ANTI FOG 6CC (MISCELLANEOUS) ×4 IMPLANT
STAPLER VISISTAT 35W (STAPLE) IMPLANT
STRIP CLOSURE SKIN 1/2X4 (GAUZE/BANDAGES/DRESSINGS) ×1 IMPLANT
SUT ETHILON 2 0 PS N (SUTURE) ×4 IMPLANT
SUT MNCRL AB 4-0 PS2 18 (SUTURE) ×6 IMPLANT
SUT RELOAD ENDO STITCH 2 48X1 (ENDOMECHANICALS) ×8 IMPLANT
SUT RELOAD ENDO STITCH 2.0 (ENDOMECHANICALS) ×4 IMPLANT
SUT SILK 0 SH 30 (SUTURE) IMPLANT
SUT VIC AB 2-0 SH 27 (SUTURE) ×20
SUT VIC AB 2-0 SH 27X BRD (SUTURE) IMPLANT
SUT VICRYL 0 TIES 12 18 (SUTURE) IMPLANT
SUT VICRYL 0 UR6 27IN ABS (SUTURE) ×2 IMPLANT
SUTURE RELOAD END STTCH 2 48X1 (ENDOMECHANICALS) ×8 IMPLANT
SUTURE RELOAD ENDO STITCH 2.0 (ENDOMECHANICALS) ×4 IMPLANT
SYR 20CC LL (SYRINGE) ×3 IMPLANT
SYR 50ML LL SCALE MARK (SYRINGE) ×4 IMPLANT
SYRINGE 20CC LL (MISCELLANEOUS) ×2 IMPLANT
TAPE CLOTH SURG 4X10 WHT LF (GAUZE/BANDAGES/DRESSINGS) ×2 IMPLANT
TOWEL OR 17X26 10 PK STRL BLUE (TOWEL DISPOSABLE) ×4 IMPLANT
TOWEL OR NON WOVEN STRL DISP B (DISPOSABLE) ×4 IMPLANT
TRAY FOLEY W/METER SILVER 14FR (SET/KITS/TRAYS/PACK) ×2 IMPLANT
TRAY FOLEY W/METER SILVER 16FR (SET/KITS/TRAYS/PACK) IMPLANT
TROCAR BLADELESS OPT 5 100 (ENDOMECHANICALS) ×4 IMPLANT
TROCAR XCEL 12X100 BLDLESS (ENDOMECHANICALS) ×6 IMPLANT
TUBING CONNECTING 10 (TUBING) ×4 IMPLANT
TUBING CONNECTING 10' (TUBING) ×2
TUBING ENDO SMARTCAP PENTAX (MISCELLANEOUS) ×4 IMPLANT
TUBING INSUF HEATED (TUBING) ×4 IMPLANT

## 2017-05-16 NOTE — Anesthesia Procedure Notes (Signed)
Procedure Name: Intubation Date/Time: 05/16/2017 7:41 AM Performed by: Victoriano Lain, CRNA Pre-anesthesia Checklist: Patient identified, Emergency Drugs available, Suction available, Patient being monitored and Timeout performed Patient Re-evaluated:Patient Re-evaluated prior to induction Oxygen Delivery Method: Circle system utilized Preoxygenation: Pre-oxygenation with 100% oxygen Induction Type: IV induction Ventilation: Mask ventilation without difficulty Laryngoscope Size: Mac and 3 Grade View: Grade I Tube type: Oral Tube size: 7.0 mm Number of attempts: 1 Airway Equipment and Method: Stylet Placement Confirmation: ETT inserted through vocal cords under direct vision,  positive ETCO2 and breath sounds checked- equal and bilateral Secured at: 20 cm Tube secured with: Tape Dental Injury: Teeth and Oropharynx as per pre-operative assessment

## 2017-05-16 NOTE — Op Note (Signed)
Preoperative diagnosis: laparoscopic revision of gastrojejunal anastamosis  Postoperative diagnosis: Same   Procedure: Upper endoscopy   Surgeon: Clovis Riley, M.D.  Anesthesia: Gen.   Indications for procedure: This patient was undergoing a laparoscopic revision of gastrojejunal anastamosis.   Description of procedure: The endoscopy was placed in the mouth and into the oropharynx and under endoscopic vision it was advanced to the esophagogastric junction. The pouch was insufflated and no bleeding was seen, however bubbles were noted along both the anterior suture line and emanating from the posterior pouch. The esophagus was without injury and the anastamosis was noted within 2-3cm of the z-line. The anastomosis was patent and able to be traversed with the scope. The anterior suture line was repaired by Dr. Kieth Brightly with resolution of the leak. At this point Dr. Lucia Gaskins joined the case and assisted with repair of a posterior gastrotomy on the pouch. A repeat leak test was negative. The scope was withdrawn without difficulty.   Clovis Riley, M.D. General, Bariatric, & Minimally Invasive Surgery [facility]

## 2017-05-16 NOTE — H&P (Signed)
Zeniah D Wolbert is an 52 y.o. female.   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 She has tried tube feeding and multiple dilations without long term improvement. She has been near weight stable over the last month on mostly liquids but feels things are becoming less agreeable.  Past Medical History:  Diagnosis Date  . Abnormal Pap smear   . Alcohol abuse   . Anemia    IDA  . Anxiety    Takes xanax  . Arthritis    left hip/knees, lower spine  . Bipolar disorder ([place])   . Blood transfusion without reported diagnosis last done 04-25-17  . Bulging lumbar disc   . Bursitis    left shoulder  . Chronic lower back pain   . Cut    right middle finger with knife small cut healing pt instructed to keep clean and dry no redness or drainage  . Depression   . Diverticulitis   . Fibromyalgia   . GERD (gastroesophageal reflux disease)   . Hypertension    none since weight loss  . Lactose intolerance in adult 2017  . Migraines    "weekly @ least" (04/26/2016)  . Seizures ([place])    one Dec. 2017 due to throat closing  . Type II diabetes mellitus ([place])    "before the gastric bypass" (04/26/2016) states she no longer has diabetes    Past Surgical History:  Procedure Laterality Date  . BALLOON DILATION N/A 05/27/2016   Procedure: BALLOON DILATION;  Surgeon: Doran Stabler, MD;  Location: [facility];  Service: Gastroenterology;  Laterality: N/A;  . BALLOON DILATION N/A 04/21/2017   Procedure: BALLOON DILATION;  Surgeon: Doran Stabler, MD;  Location: [facility];  Service: Gastroenterology;  Laterality: N/A;  . CERVICAL CONE BIOPSY    . [place]; 1996; 1998; 2014  . CESAREAN SECTION WITH BILATERAL TUBAL LIGATION Bilateral 10/28/2012   Procedure: Repeat cesarean section with delivery of baby boy at 50. Apgars 8/9.  BILATERAL TUBAL LIGATION;  Surgeon:  Florian Buff, MD;  Location: [facility];  Service: Obstetrics;  Laterality: Bilateral;  . DILATION AND CURETTAGE OF UTERUS    . ESOPHAGOGASTRODUODENOSCOPY N/A 05/27/2016   Procedure: ESOPHAGOGASTRODUODENOSCOPY (EGD);  Surgeon: Doran Stabler, MD;  Location: [facility];  Service: Gastroenterology;  Laterality: N/A;  . ESOPHAGOGASTRODUODENOSCOPY (EGD) WITH PROPOFOL N/A 04/23/2016   Procedure: ESOPHAGOGASTRODUODENOSCOPY (EGD) WITH PROPOFOL;  Surgeon: Doran Stabler, MD;  Location: [facility];  Service: Endoscopy;  Laterality: N/A;  . ESOPHAGOGASTRODUODENOSCOPY (EGD) WITH PROPOFOL N/A 01/03/2017   Procedure: ESOPHAGOGASTRODUODENOSCOPY (EGD) WITH PROPOFOL;  Surgeon: Doran Stabler, MD;  Location: [facility];  Service: Gastroenterology;  Laterality: N/A;  . ESOPHAGOGASTRODUODENOSCOPY (EGD) WITH PROPOFOL N/A 04/21/2017   Procedure: ESOPHAGOGASTRODUODENOSCOPY (EGD) WITH PROPOFOL;  Surgeon: Doran Stabler, MD;  Location: [facility];  Service: Gastroenterology;  Laterality: N/A;  . GASTROSTOMY N/A 08/16/2016   Procedure: LAPAROSCOPIC INSERTION OF GASTROSTOMY TUBE IN REMNANT STOMACH;  Surgeon: Arta Bruce Oriya Kettering, MD;  Location: [facility];  Service: General;  Laterality: N/A;  . ROUX-EN-Y GASTRIC BYPASS  2007  . TUBAL LIGATION  2014    Family History  Problem Relation Age of Onset  . Diabetes Father   . Heart disease Father   . Depression Maternal Grandmother   . Heart disease Maternal Grandfather   .  Depression Paternal Grandmother   . Colon cancer Paternal Grandfather   . Pancreatic cancer Paternal Grandfather    Social History:  reports that she quit smoking about 5 years ago. Her smoking use included cigarettes. She has a 2.00 pack-year smoking history. she has never used smokeless tobacco. She reports that she drinks about 10.2 oz of alcohol per week. She reports that she does not use drugs.  Allergies:  Allergies  Allergen Reactions  . Iron Anaphylaxis    Had acute allergy  with tachycardia, attention and generalized itching shortly after receiving nulecit ( iron gluconate) infusion.    Medications Prior to Admission  Medication Sig Dispense Refill  . ALPRAZolam (XANAX) 1 MG tablet Take 1 tablet (1 mg total) by mouth at bedtime as needed for anxiety. (Patient taking differently: Take 1 mg by mouth 3 (three) times daily. ) 30 tablet 0  . CARAFATE 1 GM/10ML suspension Take 10 mLs by mouth 3 (three) times daily. Before meals  4  . fexofenadine (ALLEGRA) 180 MG tablet Take 180 mg by mouth daily as needed for allergies.     Marland Kitchen lidocaine (XYLOCAINE) 5 % ointment Apply 1 application topically 3 (three) times daily. Apply to Bilateral shoulders    . nystatin cream (MYCOSTATIN) Apply 1 application topically daily as needed for wound care.  0  . ondansetron (ZOFRAN) 4 MG tablet Take 4 mg by mouth every 6 (six) hours as needed for nausea or vomiting.    Marland Kitchen oxyCODONE-acetaminophen (PERCOCET) 10-325 MG tablet Take 1 tablet by mouth 4 (four) times daily.    Marland Kitchen oxymetazoline (AFRIN) 0.05 % nasal spray Place 2 sprays into both nostrils 2 (two) times daily as needed for congestion.    . pregabalin (LYRICA) 50 MG capsule Take 50 mg by mouth 3 (three) times daily.    . cyclobenzaprine (FLEXERIL) 5 MG tablet Take 1 tablet (5 mg total) by mouth 3 (three) times daily as needed for muscle spasms. (Patient not taking: Reported on 03/31/2017) 30 tablet 0  . QUEtiapine (SEROQUEL) 100 MG tablet Take 1 tablet (100 mg total) by mouth at bedtime. 30 tablet 2    No results found for this or any previous visit (from the past 48 hour(s)). No results found.  Review of Systems  Constitutional: Negative for chills and fever.  HENT: Negative for hearing loss.   Eyes: Negative for blurred vision and double vision.  Respiratory: Negative for cough and hemoptysis.   Cardiovascular: Negative for chest pain and palpitations.  Gastrointestinal: Negative for abdominal pain, nausea and vomiting.   Genitourinary: Negative for dysuria and urgency.  Musculoskeletal: Negative for myalgias and neck pain.  Skin: Negative for itching and rash.  Neurological: Negative for dizziness, tingling and headaches.  Endo/Heme/Allergies: Does not bruise/bleed easily.  Psychiatric/Behavioral: Negative for depression and suicidal ideas.    Blood pressure 129/89, pulse 80, temperature 98 F (36.7 C), temperature source Oral, resp. rate 16, height 5' (1.524 m), weight 47.5 kg (104 lb 12.8 oz), last menstrual period 05/09/2017, SpO2 100 %. Physical Exam  Vitals reviewed. Constitutional: She is oriented to person, place, and time. She appears well-developed. She appears cachectic.  HENT:  Head: Normocephalic and atraumatic.  Eyes: Conjunctivae and EOM are normal. Pupils are equal, round, and reactive to light.  Neck: Normal range of motion. Neck supple.  Cardiovascular: Normal rate and regular rhythm.  Respiratory: Effort normal and breath sounds normal.  GI: Soft. Bowel sounds are normal. She exhibits no distension. There is no tenderness.  Musculoskeletal: Normal range of motion.  Neurological: She is alert and oriented to person, place, and time.  Skin: Skin is warm and dry.  Psychiatric: She has a normal mood and affect. Her behavior is normal.     Assessment/Plan 52 yo female with gastric bypass with GJ stenosis -lap GJ revision and replacement of gastrostomy tube to the remnant stomach -inpatient admission -planned discharge to SNF for additional help and support for deconditioning  [name], MD 05/16/2017, 7:12 AM

## 2017-05-16 NOTE — Anesthesia Preprocedure Evaluation (Signed)
Anesthesia Evaluation  Patient identified by MRN, date of birth, ID band Patient awake    Reviewed: Allergy & Precautions, H&P , NPO status , Patient's Chart, lab work & pertinent test results  Airway Mallampati: II   Neck ROM: full    Dental   Pulmonary former smoker,    breath sounds clear to auscultation       Cardiovascular hypertension,  Rhythm:regular Rate:Normal     Neuro/Psych  Headaches, Seizures -,  PSYCHIATRIC DISORDERS Anxiety Depression Bipolar Disorder  Neuromuscular disease    GI/Hepatic GERD  ,(+)     substance abuse  alcohol use,   Endo/Other  diabetes, Type 2  Renal/GU      Musculoskeletal  (+) Arthritis , Fibromyalgia -  Abdominal   Peds  Hematology  (+) anemia ,   Anesthesia Other Findings   Reproductive/Obstetrics                             Anesthesia Physical Anesthesia Plan  ASA: II  Anesthesia Plan: General   Post-op Pain Management:    Induction: Intravenous  PONV Risk Score and Plan: 3 and Ondansetron, Dexamethasone, Midazolam and Treatment may vary due to age or medical condition  Airway Management Planned: Oral ETT  Additional Equipment:   Intra-op Plan:   Post-operative Plan: Extubation in OR  Informed Consent: I have reviewed the patients History and Physical, chart, labs and discussed the procedure including the risks, benefits and alternatives for the proposed anesthesia with the patient or authorized representative who has indicated his/her understanding and acceptance.     Plan Discussed with: CRNA, Anesthesiologist and Surgeon  Anesthesia Plan Comments:         Anesthesia Quick Evaluation

## 2017-05-16 NOTE — Op Note (Signed)
Preop Diagnosis: marginal ulcer with stenosis  Postop Diagnosis: same  Procedure performed: laparoscopic revision of gastrojejunostomy and insertion of gastrostomy tube to remnant stomach  Assitant: Romana Juniper  Indications:  The patient is a 52 y.o.-year-old female with history of gastric bypass in the distant past.  She is struggled with stenosis related to ulcer at gastrojejunostomy site for the past year with severe malnutrition.  She is tried multiple dilations with no permanent improvement.  She presents today for revision of gastric bypass and replacement of gastrostomy tube.  Description of Operation:  Following informed consent, the patient was taken to the operating room and placed on the operating table in the supine position.  She had previously received prophylactic antibiotics and subcutaneous heparin for DVT prophylaxis in the pre-op holding area.  After induction of general endotracheal anesthesia by the anesthesiologist, the patient underwent placement of sequential compression devices, Foley catheter and an oro-gastric tube.  A timeout was confirmed by the surgery and anesthesia teams.  The patient was adequately padded at all pressure points and placed on a footboard to prevent slippage from the OR table during extremes of position during surgery.  She underwent a routine sterile prep and drape of her entire abdomen.    Next, A transverse incision was made under the right subcostal area and a 42mm optical viewing trocar was introduced into the peritoneal cavity. Pneumoperitoneum was applied with a high flow and low pressure. A laparoscope was inserted to confirm placement. A extraperitoneal block was then placed at the lateral abdominal wall using exparel diluted with marcaine. 5 additional trocars were placed: 1 34mm trocar to the left of the midline. 1 additional 34mm trocar in the left lateral area, 1 33mm trocar in the right mid abdomen, and 1 57mm trocar in the left subcostal  area.  Upon initial examination of the abdomen the intestine appeared hyperperistaltic, the remnant stomach was adhered to the left abdominal wall, the liver appeared healthy and the superior left lobe of the liver was adhered to the stomach.  Nathanson retractor was placed through the subxiphoid incision and was used to partially retract the liver.  Next blunt and sharp dissection with cautery was used to dissect the liver away from the gastric pouch.  Liver was entered and multiple areas cautery was used to perform hemostasis.  Eventually a plane was found in the liver was dissected away from the gastric pouch up and beyond the gastroesophageal junction.  Next attention was turned to isolation of the Roux limb.  Plane was found between the stomach remnant and the mesentery of the Roux limb.  Harmonic scalpel was used to dissect this plane out and separate the mesentery and Roux limb away from the gastric remnant.  Moving up towards the anastomosis there is no adherence of the gastric remnant almost directly to the anastomosis.  I would hypothesize that this is the area of marginal ulcer leading to the stenosis.  Dissection was used to identify a plane between the GJ anastomosis and the gastric remnant and these were separated.  Further dissection in this area allowed for the lateral aspect of the gastric pouch to be freed from any scar tissue in the area.  Total time of dissection for this portion of the case was 75 minutes.   Order to further identify the anastomosis, decision was made to transect the Roux limb to allow for improved posterior visualization.  Therefore 60 mm tan load stapler was used to transect the Roux limb and a  harmonic scalpel was used to take down the mesentery at that spot.  Next the scar and adhesions around the anastomosis were freed with sharp dissection and occasional harmonic scalpel.  In this very able to identify the anastomosis and posterior portion of the gastric pouch above  the anastomosis.  60 mm purple load was used to transect the distal portion of the stomach and then a 45 mm purple load was used to complete the division.  The gastrojejunal resection was removed via the 12 mm trocar using Endo Catch pouch.  Next, the Roux limb was positioned next to the pouch and a 2-0 Vicryl was used to create a posterior layer for the anastomosis in running fashion.  Due to the lie of the stomach and intestine, the right subcostal incision was up sized to a 12 mm trocar to be used for the anastomosis.  A gastrotomy was created using Harmonic scalpel.  Initially there is difficulty in determining where the gastrotomy was as a submuscular tract was created.  Therefore a Ewald tube was placed down to allow counter tension on the pouch.  Re-able to confirm full thickness gastrotomy.  Next a enterotomy was made on the Roux limb.  45 mm purple load tri-stapler was used to create an anastomosis of about 2.5 cm.  Gastroenterostomy was closed with 2-0 Vicryl in running fashion.  An additional anterior reinforcing layer was created with 2-0 Vicryl in running fashion.  Ewald tube was removed.  Next, attention was turned to the placement of the gastrostomy tube through old gastrostomy site.  Cautery was used to create a incision in the skin and then a 14-gauge Angiocath was used to gain access to the remnant stomach.  Air was insufflated via the Angiocath to ensure tip was within the stomach and then a wire was passed in Seldinger technique.  Tract was dilated up with a 12 French dilator sheath.  Further dilation occurred with a hemostat and Kelly over the wire.  Attempts were made to place the G-tube over the wire into the stomach however this was very difficult due to the mismatch of dilator sizes.  Finally, the wire was removed and the 24 French MIC G-tube was inserted.  10 mm of saline was inserted into the balloon and balloon was shown to be free moving and intact.  Balloon was pulled up against the  abdominal wall and the G-tube was sutured in place with 3 2-0 nylon.  The assistant then went and performed an upper endoscopy and leak test.  Initially there was an anterior leak as well as a medial aspect leak.  I will splinter up with 2-0 Vicryls were used to opposed anterior jejunum to the gastric pouch.  On retest there was no further leak in this area.  Attention was then turned towards the medial aspect leak it initially.  That is coming from the crus and there was concern that is going from posterior aspect posterior aspect of the anastomosis was inspected and there was found to be no leak there.  On further dissection of the mesentery in this area following downward the leak was occurring, a full-thickness gastric injury was identified.  I believe that this occurred when a submuscular tract.  2-0 Vicryl suture was used to close this defect.  Retest showed no leaks in any areas.  Due to the prolonged length of the endoscopy there was more than average air within the jejunum but not as much as possible was removed however there was a  fair amount of air left in the jejunum when the endoscope was removed.  Tisseel was used to reinforce the repairs and anastomosis.  A Blake drain was placed around the anastomosis and medial aspect of the pouch and brought out the right subcostal incision.  Hemostasis was ensured. Pneumoperitoneum was evacuated, all ports were removed.  The right lower 12 mm trocar site fascia was closed with interrupted 0 Vicryl. All incisions closed with 4-0 monocryl suture in subcuticular fashion. Steristrips and bandaids were put in place for dressing. The patient awoke from anesthesia and was brought to pacu in stable condition. All counts were correct.  Specimens:  None  Local Anesthesia: 50 ml Exparel: 0.5% Marcaine Mix  Post-Op Plan:       Pain Management: IV, prn      Antibiotics: Prophylactic      Anticoagulation: Prophylactic, Starting now      Post Op Studies/Consults:NPO  with plan for UGI in 48h      Intended Discharge: within 1 week      Intended Outpatient Follow-Up: Two Week      Intended Outpatient Studies: Not Applicable      Other: Not Applicable  Complications: Thickness gastrotomy found at air leak test and positive leak test in the anterior aspect of gastrojejunostomy.  Both of these were repaired, Tisseel was used to reinforce repair, and a drain was placed.  Patient will be kept strict n.p.o. plan to perform upper GI to evaluate for any leak or failure to heal the repairs.   Arta Bruce Eldredge Veldhuizen

## 2017-05-16 NOTE — Transfer of Care (Signed)
Immediate Anesthesia Transfer of Care Note  Patient: Loreli D Dehaas  Procedure(s) Performed: LAPAROSCOPIC REVISION OF GASTROJEJUNOSTOMY (Left Abdomen) Replacement of  GASTROSTOMY TUBE (N/A Abdomen)  Patient Location: PACU  Anesthesia Type:General  Level of Consciousness: awake, alert , oriented and patient cooperative  Airway & Oxygen Therapy: Patient Spontanous Breathing and Patient connected to face mask oxygen  Post-op Assessment: Report given to RN, Post -op Vital signs reviewed and stable and Patient moving all extremities  Post vital signs: Reviewed and stable  Last Vitals:  Vitals:   05/16/17 0639  BP: 129/89  Pulse: 80  Resp: 16  Temp: 36.7 C  SpO2: 100%    Last Pain:  Vitals:   05/16/17 0639  TempSrc: Oral         Complications: No apparent anesthesia complications

## 2017-05-16 NOTE — Anesthesia Postprocedure Evaluation (Signed)
Anesthesia Post Note  Patient: Angie [name]  Procedure(s) Performed: LAPAROSCOPIC REVISION OF GASTROJEJUNOSTOMY (Left Abdomen) Replacement of  GASTROSTOMY TUBE (N/A Abdomen)     Patient location during evaluation: PACU Anesthesia Type: General Level of consciousness: awake and alert Pain management: pain level controlled Vital Signs Assessment: post-procedure vital signs reviewed and stable Respiratory status: spontaneous breathing, nonlabored ventilation, respiratory function stable and patient connected to nasal cannula oxygen Cardiovascular status: blood pressure returned to baseline and stable Postop Assessment: no apparent nausea or vomiting Anesthetic complications: no    Last Vitals:  Vitals:   05/16/17 1430 05/16/17 1503  BP: 130/88 (!) 141/94  Pulse: 84 84  Resp: 15 14  Temp: 36.7 C (!) 36.2 C  SpO2: 100% 100%    Last Pain:  Vitals:   05/16/17 1503  TempSrc:   PainSc: [place]

## 2017-05-17 ENCOUNTER — Other Ambulatory Visit: Payer: Self-pay

## 2017-05-17 ENCOUNTER — Encounter (HOSPITAL_COMMUNITY): Payer: Self-pay | Admitting: General Surgery

## 2017-05-17 LAB — CBC WITH DIFFERENTIAL/PLATELET
Basophils Absolute: 0 10*3/uL (ref 0.0–0.1)
Basophils Relative: 0 %
Eosinophils Absolute: 0 10*3/uL (ref 0.0–0.7)
Eosinophils Relative: 0 %
HCT: 26.8 % — ABNORMAL LOW (ref 36.0–46.0)
Hemoglobin: 8.6 g/dL — ABNORMAL LOW (ref 12.0–15.0)
Lymphocytes Relative: 5 %
Lymphs Abs: 0.5 10*3/uL — ABNORMAL LOW (ref 0.7–4.0)
MCH: 25.6 pg — ABNORMAL LOW (ref 26.0–34.0)
MCHC: 32.1 g/dL (ref 30.0–36.0)
MCV: 79.8 fL (ref 78.0–100.0)
Monocytes Absolute: 0.7 10*3/uL (ref 0.1–1.0)
Monocytes Relative: 7 %
Neutro Abs: 9.2 10*3/uL — ABNORMAL HIGH (ref 1.7–7.7)
Neutrophils Relative %: 88 %
Platelets: 293 10*3/uL (ref 150–400)
RBC: 3.36 MIL/uL — ABNORMAL LOW (ref 3.87–5.11)
RDW: 22.5 % — ABNORMAL HIGH (ref 11.5–15.5)
WBC: 10.4 10*3/uL (ref 4.0–10.5)

## 2017-05-17 LAB — GLUCOSE, CAPILLARY
Glucose-Capillary: 78 mg/dL (ref 65–99)
Glucose-Capillary: 74 mg/dL (ref 65–99)
Glucose-Capillary: 75 mg/dL (ref 65–99)
Glucose-Capillary: 78 mg/dL (ref 65–99)
Glucose-Capillary: 79 mg/dL (ref 65–99)

## 2017-05-17 LAB — COMPREHENSIVE METABOLIC PANEL
ALT: 19 U/L (ref 14–54)
AST: 36 U/L (ref 15–41)
Albumin: 2.7 g/dL — ABNORMAL LOW (ref 3.5–5.0)
Alkaline Phosphatase: 57 U/L (ref 38–126)
Anion gap: 5 (ref 5–15)
BUN: 8 mg/dL (ref 6–20)
CO2: 25 mmol/L (ref 22–32)
Calcium: 7.9 mg/dL — ABNORMAL LOW (ref 8.9–10.3)
Chloride: 105 mmol/L (ref 101–111)
Creatinine, Ser: 0.58 mg/dL (ref 0.44–1.00)
GFR calc Af Amer: >60 mL/min (ref >60)
GFR calc non Af Amer: >60 mL/min (ref >60)
Glucose, Bld: 97 mg/dL (ref 65–99)
Potassium: 4.3 mmol/L (ref 3.5–5.1)
Sodium: 135 mmol/L (ref 135–145)
Total Bilirubin: 0.3 mg/dL (ref 0.3–1.2)
Total Protein: 5.4 g/dL — ABNORMAL LOW (ref 6.5–8.1)

## 2017-05-17 LAB — TROPONIN I: Troponin I: <0.03 ng/mL (ref <0.03)

## 2017-05-17 MED ORDER — JEVITY 1.2 CAL PO LIQD: 1000.0000 mL | ORAL | Status: DC

## 2017-05-17 MED ORDER — JEVITY 1.2 CAL PO LIQD
1000.0000 mL | ORAL | Status: DC
  Administered 2017-05-18 – 2017-05-20 (×3): 1000 mL
  Filled 2017-05-17 (×5): qty 1000

## 2017-05-17 MED ORDER — ADULT MULTIVITAMIN LIQUID CH
15.0000 mL | Freq: Every day | ORAL | Status: DC
  Administered 2017-05-19: 15 mL via ORAL
  Filled 2017-05-17 (×3): qty 15

## 2017-05-17 NOTE — Progress Notes (Signed)
Initial Nutrition Assessment  DOCUMENTATION CODES:   Severe malnutrition in context of chronic illness  INTERVENTION:  TF recommendations:  Goal rate 85mL/hr Jevity 1.2 which provides 1728kcal, 80g protein, 1188mL free water.  RD to order liquid MVI.   RD recommends monitoring labs (K+, Mg, Phos); pt at risk for refeeding syndrome given severe malnutrition.    NUTRITION DIAGNOSIS:   Severe Malnutrition related to chronic illness(recurring ulcer leading to stenosis of GJ site) as evidenced by percent weight loss, severe muscle depletion, moderate fat depletion(13% over 10months).  GOAL:   Patient will meet greater than or equal to 90% of their needs   MONITOR:   Weight trends, TF tolerance, Diet advancement  REASON FOR ASSESSMENT:   Consult Diet education  ASSESSMENT:  52 y.o. 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 S/p revision of gastric bypass and replacement of gastrostomy tube.  Pt was receiving Jevity 1.2 @ 88mL/hr at the time of visit. Per MD orders, rate will be advanced to 69mL/hr at 1400.  Per pt report, MD will discuss clear liquid diet tomorrow.   At time of visit, pt denies nausea. Currently NPO.   Pt states she was doing nocturnal feeds from 2000-0800 at 7.25mL/hr PTA; this provides a total of 71mL, 108kcal, and 5g protein. Timeframe not specified.   One week PTA pt reports crushing MVI daily due to difficulty in swallowing. Prior to this, pt reports irregular MVI intake. Pt reports her MVI is not bariatric specific (over the counter).   Per RN, swallow study will be performed tomorrow.   13% wt loss over 4 months, which is significant for time frame. Pt reports weighing 115lb at lost hospital admission, which is not confirmed in the medical record. One week after prior admission, pt reports a weight of 106lb, which is  confirmed by the medical record. No weight loss noted since 04/25/17.   Labs reviewed. Medications reviewed.    NUTRITION - FOCUSED PHYSICAL EXAM:    Most Recent Value  Orbital Region  Moderate depletion  Upper Arm Region  No depletion  Thoracic and Lumbar Region  Unable to assess  Buccal Region  Moderate depletion  Temple Region  Severe depletion  Clavicle Bone Region  Severe depletion  Clavicle and Acromion Bone Region  Severe depletion  Scapular Bone Region  Severe depletion  Dorsal Hand  Moderate depletion  Patellar Region  Mild depletion  Anterior Thigh Region  Mild depletion  Posterior Calf Region  Mild depletion  Edema (RD Assessment)  Unable to assess  Hair  Reviewed [Pt reports recent hair loss]  Eyes  Unable to assess [Pt reports changes in vision]  Mouth  Unable to assess  Skin  Reviewed Judeth Cornfield,  especially dry/flakey skin in the facial area]  Nails  Unable to assess [Pt has fake nails]       Diet Order:  Diet NPO time specified  EDUCATION NEEDS:   No education needs have been identified at this time  Skin:  Skin Assessment: Skin Integrity Issues: Skin Integrity Issues:: Incisions Incisions: abdominal surgical incision   Last BM:  1/21  Height:   Ht Readings from Last 1 Encounters:  05/16/17 5' (1.524 m)    Weight:   Wt Readings from Last 1 Encounters:  05/17/17 111 lb 12.4 oz (50.7 kg)  05/13/17 48.1kg 04/26/17 49.8kg 04/25/17 45.4kg 04/21/17 47.6kg 01/03/17 58.1kg  Ideal Body Weight:  45.5  kg  BMI:  Body mass index is 21.83 kg/m.  Estimated Nutritional Needs:   Kcal:  1550-1750  Protein:  80-90  Fluid:  1.5-1.7L   Kameela Leipold, MS, Dietetic Intern Pager # [phone]

## 2017-05-18 ENCOUNTER — Inpatient Hospital Stay (HOSPITAL_COMMUNITY): Payer: Medicare Other

## 2017-05-18 LAB — CBC WITH DIFFERENTIAL/PLATELET
Basophils Absolute: 0 10*3/uL (ref 0.0–0.1)
Basophils Relative: 0 %
Eosinophils Absolute: 0.2 10*3/uL (ref 0.0–0.7)
Eosinophils Relative: 2 %
HCT: 26.9 % — ABNORMAL LOW (ref 36.0–46.0)
Hemoglobin: 8.7 g/dL — ABNORMAL LOW (ref 12.0–15.0)
Lymphocytes Relative: 8 %
Lymphs Abs: 0.6 10*3/uL — ABNORMAL LOW (ref 0.7–4.0)
MCH: 25.7 pg — ABNORMAL LOW (ref 26.0–34.0)
MCHC: 32.3 g/dL (ref 30.0–36.0)
MCV: 79.6 fL (ref 78.0–100.0)
Monocytes Absolute: 0.5 10*3/uL (ref 0.1–1.0)
Monocytes Relative: 6 %
Neutro Abs: 6.5 10*3/uL (ref 1.7–7.7)
Neutrophils Relative %: 84 %
Platelets: 247 10*3/uL (ref 150–400)
RBC: 3.38 MIL/uL — ABNORMAL LOW (ref 3.87–5.11)
RDW: 23.8 % — ABNORMAL HIGH (ref 11.5–15.5)
WBC: 7.8 10*3/uL (ref 4.0–10.5)

## 2017-05-18 LAB — GLUCOSE, CAPILLARY
Glucose-Capillary: 71 mg/dL (ref 65–99)
Glucose-Capillary: 78 mg/dL (ref 65–99)
Glucose-Capillary: 125 mg/dL — ABNORMAL HIGH (ref 65–99)

## 2017-05-18 MED ORDER — NALOXONE HCL 0.4 MG/ML IJ SOLN: 0.4000 mg | INTRAMUSCULAR | Status: DC | PRN

## 2017-05-18 MED ORDER — QUETIAPINE FUMARATE 100 MG PO TABS
100.0000 mg | ORAL_TABLET | Freq: Every day | ORAL | Status: DC
  Administered 2017-05-18 – 2017-05-24 (×7): 100 mg via ORAL
  Filled 2017-05-18 (×7): qty 1

## 2017-05-18 MED ORDER — DIPHENHYDRAMINE HCL 12.5 MG/5ML PO ELIX: 12.5000 mg | ORAL_SOLUTION | Freq: Four times a day (QID) | ORAL | Status: DC | PRN

## 2017-05-18 MED ORDER — PREGABALIN 50 MG PO CAPS
50.0000 mg | ORAL_CAPSULE | Freq: Three times a day (TID) | ORAL | Status: DC
  Administered 2017-05-18 – 2017-05-24 (×16): 50 mg via ORAL
  Filled 2017-05-18 (×17): qty 1

## 2017-05-18 MED ORDER — LIDOCAINE 5 % EX OINT
TOPICAL_OINTMENT | Freq: Two times a day (BID) | CUTANEOUS | Status: DC | PRN
  Administered 2017-05-19 – 2017-05-20 (×2): 1 via TOPICAL
  Administered 2017-05-21 – 2017-05-24 (×4): via TOPICAL
  Filled 2017-05-18: qty 35.44

## 2017-05-18 MED ORDER — DIPHENHYDRAMINE HCL 50 MG/ML IJ SOLN: 12.5000 mg | Freq: Four times a day (QID) | INTRAMUSCULAR | Status: DC | PRN

## 2017-05-18 MED ORDER — SODIUM CHLORIDE 0.9% FLUSH: 9.0000 mL | INTRAVENOUS | Status: DC | PRN

## 2017-05-18 MED ORDER — LIDOCAINE 4 % EX CREA
TOPICAL_CREAM | Freq: Two times a day (BID) | CUTANEOUS | Status: DC | PRN
  Filled 2017-05-18: qty 5

## 2017-05-18 MED ORDER — HYDROMORPHONE 1 MG/ML IV SOLN
INTRAVENOUS | Status: DC
  Administered 2017-05-18: 4.5 mg via INTRAVENOUS
  Administered 2017-05-18: 3.5 mg via INTRAVENOUS
  Administered 2017-05-18: 13:00:00 via INTRAVENOUS
  Administered 2017-05-19: 3.5 mg via INTRAVENOUS
  Administered 2017-05-19: 7 mg via INTRAVENOUS
  Administered 2017-05-19: 3 mg via INTRAVENOUS
  Administered 2017-05-19: 14:00:00 via INTRAVENOUS
  Administered 2017-05-19 (×2): 3.5 mg via INTRAVENOUS
  Administered 2017-05-20: 4 mg via INTRAVENOUS
  Administered 2017-05-20: 3 mg via INTRAVENOUS
  Administered 2017-05-20: 16:00:00 via INTRAVENOUS
  Administered 2017-05-20: 8.5 mg via INTRAVENOUS
  Administered 2017-05-20: 5 mg via INTRAVENOUS
  Administered 2017-05-20: 1.5 mg via INTRAVENOUS
  Administered 2017-05-21: 2.5 mg via INTRAVENOUS
  Administered 2017-05-21: 5.5 mg via INTRAVENOUS
  Filled 2017-05-18 (×3): qty 25

## 2017-05-18 MED ORDER — ONDANSETRON HCL 4 MG/2ML IJ SOLN: 4.0000 mg | Freq: Four times a day (QID) | INTRAMUSCULAR | Status: DC | PRN

## 2017-05-18 MED ORDER — IOPAMIDOL (ISOVUE-300) INJECTION 61%
100.0000 mL | Freq: Once | INTRAVENOUS | Status: AC | PRN
  Administered 2017-05-18: 100 mL via ORAL

## 2017-05-18 MED ORDER — ALPRAZOLAM 1 MG PO TABS: 1.0000 mg | ORAL_TABLET | Freq: Every evening | ORAL | Status: DC | PRN

## 2017-05-18 MED ORDER — PREMIER PROTEIN SHAKE
2.0000 [oz_av] | ORAL | Status: DC
  Administered 2017-05-18 – 2017-05-19 (×5): 2 [oz_av] via ORAL

## 2017-05-18 NOTE — Progress Notes (Signed)
Patient is requesting lidocaine cream and medication to help her sleep. Dr on call has been paged twice, will wait on orders.

## 2017-05-18 NOTE — Progress Notes (Signed)
Patient alert and oriented, Post op day 2.  Provided contact information for [place], support and encouragement.  Encouraged pulmonary toilet, mobility and small sips of liquids.   We developed plan to have all "meals" in the chair as a goal for the am.  All questions answered.  Will continue to monitor.

## 2017-05-18 NOTE — Progress Notes (Signed)
[facility] faculty was notified that RN was unable to get in touch with the doctor on call concerning the patient.

## 2017-05-19 LAB — COMPREHENSIVE METABOLIC PANEL
ALT: 12 U/L — ABNORMAL LOW (ref 14–54)
AST: 12 U/L — ABNORMAL LOW (ref 15–41)
Albumin: 2.5 g/dL — ABNORMAL LOW (ref 3.5–5.0)
Alkaline Phosphatase: 64 U/L (ref 38–126)
Anion gap: 5 (ref 5–15)
BUN: 7 mg/dL (ref 6–20)
CO2: 24 mmol/L (ref 22–32)
Calcium: 8.1 mg/dL — ABNORMAL LOW (ref 8.9–10.3)
Chloride: 107 mmol/L (ref 101–111)
Creatinine, Ser: 0.39 mg/dL — ABNORMAL LOW (ref 0.44–1.00)
GFR calc Af Amer: >60 mL/min (ref >60)
GFR calc non Af Amer: >60 mL/min (ref >60)
Glucose, Bld: 65 mg/dL (ref 65–99)
Potassium: 3.9 mmol/L (ref 3.5–5.1)
Sodium: 136 mmol/L (ref 135–145)
Total Bilirubin: 0.3 mg/dL (ref 0.3–1.2)
Total Protein: 5.1 g/dL — ABNORMAL LOW (ref 6.5–8.1)

## 2017-05-19 LAB — GLUCOSE, CAPILLARY
Glucose-Capillary: 105 mg/dL — ABNORMAL HIGH (ref 65–99)
Glucose-Capillary: 109 mg/dL — ABNORMAL HIGH (ref 65–99)
Glucose-Capillary: 115 mg/dL — ABNORMAL HIGH (ref 65–99)
Glucose-Capillary: 60 mg/dL — ABNORMAL LOW (ref 65–99)
Glucose-Capillary: 66 mg/dL (ref 65–99)
Glucose-Capillary: 71 mg/dL (ref 65–99)
Glucose-Capillary: 85 mg/dL (ref 65–99)

## 2017-05-19 LAB — FERRITIN: Ferritin: 188 ng/mL (ref 11–307)

## 2017-05-19 LAB — PHOSPHORUS: Phosphorus: 2.8 mg/dL (ref 2.5–4.6)

## 2017-05-19 LAB — VITAMIN B12: Vitamin B-12: 270 pg/mL (ref 180–914)

## 2017-05-19 LAB — MAGNESIUM: Magnesium: 1.5 mg/dL — ABNORMAL LOW (ref 1.7–2.4)

## 2017-05-19 MED ORDER — BOOST / RESOURCE BREEZE PO LIQD CUSTOM
1.0000 | ORAL | Status: DC
  Administered 2017-05-19 – 2017-05-24 (×6): 1 via ORAL

## 2017-05-19 MED ORDER — ADULT MULTIVITAMIN LIQUID CH: 15.0000 mL | Freq: Every day | ORAL | Status: DC

## 2017-05-19 MED ORDER — UNJURY CHOCOLATE CLASSIC POWDER
8.0000 [oz_av] | Freq: Two times a day (BID) | ORAL | Status: DC
  Administered 2017-05-19 – 2017-05-25 (×11): 8 [oz_av] via ORAL

## 2017-05-19 MED ORDER — ADULT MULTIVITAMIN LIQUID CH
15.0000 mL | Freq: Every day | ORAL | Status: DC
  Administered 2017-05-20 – 2017-05-25 (×6): 15 mL
  Filled 2017-05-19 (×6): qty 15

## 2017-05-19 MED ORDER — MAGNESIUM SULFATE 4 GM/100ML IV SOLN
4.0000 g | Freq: Once | INTRAVENOUS | Status: AC
  Administered 2017-05-19: 4 g via INTRAVENOUS
  Filled 2017-05-19: qty 100

## 2017-05-19 NOTE — Progress Notes (Signed)
Progress Note: General Surgery Service   Assessment/Plan: Patient Active Problem List   Diagnosis Date Noted  . 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 Symptomatic Anemia 04/25/2017  . Symptomatic anemia 04/25/2017  . Abnormal loss of weight   . Dysphagia   . S/P percutaneous endoscopic gastrostomy (PEG) tube placement ([place]) 09/06/2016  . At risk for adverse drug event 08/25/2016  . Malnutrition following gastrointestinal surgery 08/16/2016  . Anastomotic ulcer 07/21/2016  . Transient alteration of awareness   . Alcohol use 04/27/2016  . Chronic pain syndrome 04/27/2016  . Iron deficiency anemia 04/27/2016  . B12 deficiency 04/27/2016  . Gastrojejunal anastomotic stricture   . Hypoglycemia 04/22/2016  . Syncope 04/22/2016  . Protein-calorie malnutrition, severe 04/22/2016  . Nausea and vomiting   . Abdominal pain, chronic, epigastric   . Hypotension 02/01/2016  . Hyponatremia 02/01/2016  . Subacromial impingement of left shoulder 07/06/2013  . Trochanteric bursitis of left hip 05/11/2013  . Arthritis 05/16/2012  . S/P gastric bypass 05/16/2012   s/p Procedure(s): LAPAROSCOPIC REVISION OF GASTROJEJUNOSTOMY Replacement of  GASTROSTOMY TUBE 05/16/2017  -continue PCA today -advance to bariatric full liquids -add liquid vitamin -ambulate with assist today -check electrolytes and vitamins for refeeding  \ LOS: 3 days  Chief Complaint/Subjective: Pain improved with PCA, +BM and flatus, UGI negative for leak yesterday with good passage through [place], tolerated tube feeds well  Objective: Vital signs in last 24 hours: Temp:  [97.7 F (36.5 C)-98.7 F (37.1 C)] 97.7 F (36.5 C) (01/24 0537) Pulse Rate:  [63-92] 71 (01/24 0537) Resp:  [13-20] 16 (01/24 0537) BP: (101-151)/(74-99) 101/74 (01/24 0537) SpO2:  [99 %-100 %] 100 % (01/24 0537) Weight:  [49.2 kg (108 lb 6.4 oz)-49.6 kg (109 lb 5.6 oz)] 49.6 kg (109 lb 5.6 oz) (01/24 0500) Last  BM Date: 05/16/17  Intake/Output from previous day: 01/23 0701 - 01/24 0700 In: 2625 [P.O.:300; I.V.:1725; NG/GT:600] Out: 1965 [Urine:1900; Drains:65] Intake/Output this shift: Total I/O In: -  Out: 400 [Urine:400]  Lungs: CTAB  Cardiovascular: RRR  Abd: soft, ATTP, blake with serosang drainage, incisions c/d/i  Extremities: no edema  Neuro: AOx4  Lab Results: CBC  Recent Labs    05/17/17 0525 05/18/17 0452  WBC 10.4 7.8  HGB 8.6* 8.7*  HCT 26.8* 26.9*  PLT 293 247   BMET Recent Labs    05/16/17 1554 05/17/17 0525  NA  --  135  K  --  4.3  CL  --  105  CO2  --  25  GLUCOSE  --  97  BUN  --  8  CREATININE 0.68 0.58  CALCIUM  --  7.9*   PT/INR No results for input(s): LABPROT, INR in the last 72 hours. ABG No results for input(s): PHART, HCO3 in the last 72 hours.  Invalid input(s): PCO2, PO2  Studies/Results:  Anti-infectives: Anti-infectives (From admission, onward)   Start     Dose/Rate Route Frequency Ordered Stop   05/16/17 0626  cefoTEtan in Dextrose 5% (CEFOTAN) 2-2.08 GM-%(50ML) IVPB    Comments:  Carleene Cooper   : cabinet override      05/16/17 0626 05/16/17 0742   05/16/17 0624  cefoTEtan in Dextrose 5% (CEFOTAN) IVPB 2 g     2 g Intravenous On call to O.R. 05/16/17 0539 05/16/17 7673      Medications: Scheduled Meds: . enoxaparin (LOVENOX) injection  30 mg Subcutaneous Q12H  . HYDROmorphone   Intravenous Q4H  . insulin  aspart  0-9 Units Subcutaneous TID WC  . multivitamin  15 mL Oral Daily  . multivitamin  15 mL Oral Daily  . pantoprazole (PROTONIX) IV  40 mg Intravenous QHS  . pregabalin  50 mg Oral TID  . protein supplement shake  2 oz Oral Q4H  . QUEtiapine  100 mg Oral QHS   Continuous Infusions: . dextrose 5 % and 0.45 % NaCl with KCl 20 mEq/L 75 mL/hr at 05/18/17 0805  . feeding supplement (JEVITY 1.2 CAL) 1,000 mL (05/19/17 0407)  . methocarbamol (ROBAXIN)  IV Stopped (05/17/17 0151)   PRN Meds:.ALPRAZolam,  diphenhydrAMINE **OR** diphenhydrAMINE, lidocaine, methocarbamol (ROBAXIN)  IV, naloxone **AND** sodium chloride flush, ondansetron (ZOFRAN) IV, ondansetron (ZOFRAN) IV  [name], MD Pg# [phone] [facility]

## 2017-05-19 NOTE — Progress Notes (Signed)
Nutrition Follow-up  DOCUMENTATION CODES:   Severe malnutrition in context of chronic illness   INTERVENTION:   RD recommends continuing to monitor K+, Mg, Phos, for refeeding syndrome.  RD to order:  Unjury (chocolate) BID; each supplement provides 100kcal and 21g protein  Boost Breeze Q24H; each supplement provides 250kcals and 9g of protein.  MVI (liquid) route modified to PEG.   NUTRITION DIAGNOSIS:   Severe Malnutrition related to chronic illness(recurring ulcer leading to stenosis of GJ site) as evidenced by percent weight loss, severe muscle depletion, moderate fat depletion(13% over 7months). Ongoing.  GOAL:   Patient will meet greater than or equal to 90% of their needs  Progressing.  MONITOR:   PO intake, Weight trends, TF tolerance, Labs    ASSESSMENT:   52 y.o. 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 S/p revision of gastric bypass and replacement of gastrostomy tube.  Pt receiving Jevity @ 27mL/hr at time of visit. Pt's diet was advanced to bariatric full liquids this morning at 0846 from bariatric clear liquid. Pt reports tolerating EN and po intake well, without N/V or dumping.   Pt reports consuming 1 Boost Breeze, 4oz Premier protein, and 100% of cream chicken soup today. Pt has consumed 3 cups of tea today as well. Pt has not consumed any sugar free puddings x 4 as observed on bedside tray at time of visit.  Pt reports consuming 1 Boost Breeze and [employer] Protein yesterday.  Estimated 24hr po, EN, and IV intake: 1466kcal (84-96% of needs) 61g protein (68-76% of needs)  Pt was amenable to Unjury (chocolate) nutrition supplements in addition to Boost Breeze Q24H.   Medications: MVI (liquid via tube), Mg (IV),   Labs: Mg (L),    Diet Order:  Diet bariatric full liquid Room service appropriate? Yes; Fluid  consistency: Thin  EDUCATION NEEDS:   No education needs have been identified at this time  Skin:  Skin Assessment: Skin Integrity Issues: Skin Integrity Issues:: Incisions Incisions: abdominal surgical incision   Last BM:  1/24  Height:   Ht Readings from Last 1 Encounters:  05/16/17 5' (1.524 m)    Weight:  Wt Readings from Last 3 Encounters:  05/19/17 109 lb 5.6 oz (49.6 kg)  05/13/17 106 lb (48.1 kg)  04/26/17 109 lb 12.8 oz (49.8 kg)    Ideal Body Weight:  45.5 kg  BMI:  Body mass index is 21.36 kg/m.  Estimated Nutritional Needs:   Kcal:  1550-1750  Protein:  80-90  Fluid:  1.5-1.7L  Yuya Vanwingerden, MS, Dietetic Intern Pager # [phone]

## 2017-05-19 NOTE — Progress Notes (Signed)
Patient alert and oriented, Post op day 3.  Provided support and encouragement.  Encouraged pulmonary toilet, mobility and small sips of liquids. Patient provided with unjury protein mixed with water due to lactose intolerance.  Patient taking without difficulty.  Question about Boost from patient.  Discussed having only one per day 1/2 in am and 1/2 in pm.  Jevity continues through G tube.  No concerns voiced to coordinator.   All questions answered.  Will continue to monitor.

## 2017-05-19 NOTE — Clinical Social Work Note (Signed)
Clinical Social Work Assessment  Patient Details  Name: Angie [name] MRN: [id number] Date of Birth: 1966/02/18  Date of referral:  05/19/17               Reason for consult:  Facility Placement                Permission sought to share information with:  Family Supports, Customer service manager Permission granted to share information::  Yes, Verbal Permission Granted  Name::        Agency::  Skilled Nursing   Relationship::  Fiance [name],[name]  Contact Information:  [phone]  Housing/Transportation Living arrangements for the past 2 months:  [place] of Information:  Patient Patient Interpreter Needed:  None Criminal Activity/Legal Involvement Pertinent to Current Situation/Hospitalization:  No - Comment as needed Significant Relationships:  Adult Children, Dependent Children, Significant Other Lives with:  Minor Children, Significant Other Do you feel safe going back to the place where you live?  Yes Need for family participation in patient care:  Yes   Care giving concerns:   Patient admitted for with gastric bypass in the past for weight loss and has been dealing with marginal ulcer.  Patient is currently receiving  tube feedings.   PT/OT evaluation.   Social Worker assessment / plan:  CSW met with the patient at bedside, explain role and reason for visit-to assist with discharge to SNF. Patient reports she plans to discharge to SNF. The patient is familiar with the SNF process and d/c to rehab. She reports after her last acute hospital visit she rehabbed at [facility] and stayed for thirty days.   Patient prefers [facility] SNF this time.  Patient understands she will need prior authorization before discharging.  CSW sent patient clinical information to [facility] and confirmed they will be able to offer bed at discharge.  Plan: assist with discharge to SNF.   Employment status:  Disabled (Comment on whether or not currently receiving  Disability) Insurance information:  Managed Medicare PT Recommendations:    Information / Referral to community resources:  [place]  Patient/Family's Response to care:  Agreeable and Responding well to care. Patient appreciative of CSW services.   Patient/Family's Understanding of and Emotional Response to Diagnosis, Current Treatment, and Prognosis: Patient very independent and knowledge of diagnosis and follow up care.  She is feeling motivated to go to rehab and progress towards returning home to her fiance and 43 year old son.   Emotional Assessment Appearance:  Appears stated age Attitude/Demeanor/Rapport:    Affect (typically observed):  Accepting, Pleasant Orientation:  Oriented to Self, Oriented to Place, Oriented to  Time, Oriented to Situation Alcohol / Substance use:  Not Applicable Psych involvement (Current and /or in the community):  No (Comment)  Discharge Needs  Concerns to be addressed:  Discharge Planning Concerns Readmission within the last 30 days:  No Current discharge risk:  Dependent with Mobility Barriers to Discharge:  Continued Medical Work up   [employer], LCSW 05/19/2017, 11:51 AM

## 2017-05-19 NOTE — Care Management Important Message (Signed)
Important Message  Patient Details  Name: Angie [name] MRN: [id number] Date of Birth: June 14, 1965   Medicare Important Message Given:  Yes    Kerin Salen 05/19/2017, 11:44 AMImportant Message  Patient Details  Name: Angie [name] MRN: [id number] Date of Birth: [birth year]   Medicare Important Message Given:  Yes    Kerin Salen 05/19/2017, 11:44 AM

## 2017-05-20 LAB — GLUCOSE, CAPILLARY
Glucose-Capillary: 169 mg/dL — ABNORMAL HIGH (ref 65–99)
Glucose-Capillary: 172 mg/dL — ABNORMAL HIGH (ref 65–99)
Glucose-Capillary: 60 mg/dL — ABNORMAL LOW (ref 65–99)
Glucose-Capillary: 74 mg/dL (ref 65–99)
Glucose-Capillary: 83 mg/dL (ref 65–99)
Glucose-Capillary: 84 mg/dL (ref 65–99)
Glucose-Capillary: 90 mg/dL (ref 65–99)
Glucose-Capillary: 96 mg/dL (ref 65–99)

## 2017-05-20 MED ORDER — PANTOPRAZOLE SODIUM 40 MG PO TBEC
40.0000 mg | DELAYED_RELEASE_TABLET | Freq: Every day | ORAL | Status: DC
  Administered 2017-05-20 – 2017-05-24 (×5): 40 mg via ORAL
  Filled 2017-05-20 (×5): qty 1

## 2017-05-20 NOTE — Evaluation (Signed)
Physical Therapy Evaluation Patient Details Name: Angie [name] MRN: [id number] DOB: 11/26/1965 Today's Date: 05/20/2017   History of Present Illness  52 yo female with gastric bypass in the past for weight loss has been dealing with a marginal ulcer leading to stenosis for the past year, S/p revision of gastric bypass and replacement of gastrostomy tube.    Clinical Impression  Pt admitted with problem listed above. Pt currently with functional limitations due to the deficits listed below (See PT Problem List). Pt will benefit from skilled PT to increase their independence and safety with mobility to allow discharge. Pt reported she has been too weak to mobilize frequently prior to admission, due to significant weight loss and inability to eat. Pt ambulated with min assist, followed by recliner for safety and required being rolled back to room in chair due to fatigue. Pt reports plan to d/c to SNF to regain strength before returning home with family.     Follow Up Recommendations SNF    Equipment Recommendations  Rolling walker with 5" wheels    Recommendations for Other Services       Precautions / Restrictions Precautions Precautions: Fall Precaution Comments: gastrostomy tube, R JP drain      Mobility  Bed Mobility Overal bed mobility: Needs Assistance Bed Mobility: Supine to Sit     Supine to sit: HOB elevated;Supervision     General bed mobility comments: increased time, supervision for lines  Transfers Overall transfer level: Needs assistance Equipment used: Rolling walker (2 wheeled) Transfers: Sit to/from Stand Sit to Stand: Min assist         General transfer comment: assist to rise and control descent  Ambulation/Gait Ambulation/Gait assistance: Min assist Ambulation Distance (Feet): 70 Feet Assistive device: Rolling walker (2 wheeled) Gait Pattern/deviations: Step-through pattern;Decreased stride length     General Gait Details: verbal cues for  safety, RW position, posture; decreased gait speed due to fatigue, followed with recliner and rolled back to her room   Stairs            Wheelchair Mobility    Modified Rankin (Stroke Patients Only)       Balance Overall balance assessment: History of Falls                                           Pertinent Vitals/Pain Pain Assessment: No/denies pain    Home Living Family/patient expects to be discharged to:: Skilled nursing facility Living Arrangements: Spouse/significant other;Children Available Help at Discharge: [place]: None      Prior Function Level of Independence: Needs assistance         Comments: Pt has been too weak to mobilize frequently, reports significant weight loss and inability to eat prior to admission     Hand Dominance        Extremity/Trunk Assessment   Upper Extremity Assessment Upper Extremity Assessment: Generalized weakness    Lower Extremity Assessment Lower Extremity Assessment: Generalized weakness    Cervical / Trunk Assessment Cervical / Trunk Assessment: Normal  Communication   Communication: No difficulties  Cognition Arousal/Alertness: Awake/alert Behavior During Therapy: WFL for tasks assessed/performed Overall Cognitive Status: Within Functional Limits for tasks assessed  General Comments      Exercises     Assessment/Plan    PT Assessment Patient needs continued PT services  PT Problem List Decreased strength;Decreased mobility;Decreased coordination;Decreased activity tolerance;Decreased balance;Decreased knowledge of use of DME       PT Treatment Interventions DME instruction;Therapeutic activities;Gait training;Therapeutic exercise;Patient/family education;Balance training;Stair training;Functional mobility training    PT Goals (Current goals can be found in the Care Plan section)   Acute Rehab PT Goals Patient Stated Goal: To regain strength to be independent again PT Goal Formulation: With patient Time For Goal Achievement: 05/27/17 Potential to Achieve Goals: Good    Frequency Min 3X/week   Barriers to discharge        Co-evaluation               AM-PAC PT "6 Clicks" Daily Activity  Outcome Measure Difficulty turning over in bed (including adjusting bedclothes, sheets and blankets)?: A Lot Difficulty moving from lying on back to sitting on the side of the bed? : A Lot Difficulty sitting down on and standing up from a chair with arms (e.g., wheelchair, bedside commode, etc,.)?: Unable Help needed moving to and from a bed to chair (including a wheelchair)?: A Lot Help needed walking in hospital room?: A Lot Help needed climbing 3-5 steps with a railing? : A Lot 6 Click Score: 11    End of Session Equipment Utilized During Treatment: Gait belt Activity Tolerance: Patient limited by fatigue Patient left: in chair;with call bell/phone within reach;with family/visitor present;with nursing/sitter in room Nurse Communication: Mobility status PT Visit Diagnosis: Muscle weakness (generalized) (M62.81);Difficulty in walking, not elsewhere classified (R26.2)    Time: 5277-8242 PT Time Calculation (min) (ACUTE ONLY): 21 min   Charges:   PT Evaluation $PT Eval Moderate Complexity: 1 Mod     PT G Codes:       [place] Kileigh Ortmann, SPT  [place] Aniqa Hare 05/20/2017, 12:43 PM

## 2017-05-20 NOTE — Progress Notes (Signed)
The patient is receiving Protonix by the intravenous route.  Based on criteria approved by the Pharmacy and [place], the medication is being converted to the equivalent oral dose form.  These criteria include: -No active GI bleeding -Able to tolerate diet of full liquids (or better) or tube feeding -Able to tolerate other medications by the oral or enteral route  If you have any questions about this conversion, please contact the Pharmacy Department (phone [phone]).  Thank you. Eudelia Bunch, Pharm.D. [phone] 05/20/2017 1:04 PM

## 2017-05-20 NOTE — Progress Notes (Signed)
Progress Note: General Surgery Service   Assessment/Plan: Patient Active Problem List   Diagnosis Date Noted  . 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 Symptomatic Anemia 04/25/2017  . Symptomatic anemia 04/25/2017  . Abnormal loss of weight   . Dysphagia   . S/P percutaneous endoscopic gastrostomy (PEG) tube placement ([place]) 09/06/2016  . At risk for adverse drug event 08/25/2016  . Malnutrition following gastrointestinal surgery 08/16/2016  . Anastomotic ulcer 07/21/2016  . Transient alteration of awareness   . Alcohol use 04/27/2016  . Chronic pain syndrome 04/27/2016  . Iron deficiency anemia 04/27/2016  . B12 deficiency 04/27/2016  . Gastrojejunal anastomotic stricture   . Hypoglycemia 04/22/2016  . Syncope 04/22/2016  . Protein-calorie malnutrition, severe 04/22/2016  . Nausea and vomiting   . Abdominal pain, chronic, epigastric   . Hypotension 02/01/2016  . Hyponatremia 02/01/2016  . Subacromial impingement of left shoulder 07/06/2013  . Trochanteric bursitis of left hip 05/11/2013  . Arthritis 05/16/2012  . S/P gastric bypass 05/16/2012   s/p Procedure(s): LAPAROSCOPIC REVISION OF GASTROJEJUNOSTOMY Replacement of  GASTROSTOMY TUBE 05/16/2017 -dc PCA in am -change to reg full liquid diet    LOS: 4 days  Chief Complaint/Subjective: Ambulated in hallway 49ft. Tolerating liquids well, does not like the sugar free flavor  Objective: Vital signs in last 24 hours: Temp:  [98 F (36.7 C)-98.4 F (36.9 C)] 98 F (36.7 C) (01/25 0500) Pulse Rate:  [76-96] 89 (01/25 1342) Resp:  [14-18] 18 (01/25 1342) BP: (103-116)/(56-76) 103/68 (01/25 1342) SpO2:  [100 %] 100 % (01/25 1342) Weight:  [55.3 kg (122 lb)] 55.3 kg (122 lb) (01/25 0500) Last BM Date: 05/19/17  Intake/Output from previous day: 01/24 0701 - 01/25 0700 In: 2746.7 [P.O.:840; I.V.:1246.7; NG/GT:360] Out: 2565 [Urine:2450; Drains:115] Intake/Output this  shift: Total I/O In: -  Out: 400 [Urine:400]  Lungs: CTAB  Cardiovascular: RRR  Abd: soft, blake with SS drainage, G tube in place functioning well  Extremities: no edema  Neuro: AOx4  Lab Results: CBC  Recent Labs    05/18/17 0452  WBC 7.8  HGB 8.7*  HCT 26.9*  PLT 247   BMET Recent Labs    05/19/17 0908  NA 136  K 3.9  CL 107  CO2 24  GLUCOSE 65  BUN 7  CREATININE 0.39*  CALCIUM 8.1*   PT/INR No results for input(s): LABPROT, INR in the last 72 hours. ABG No results for input(s): PHART, HCO3 in the last 72 hours.  Invalid input(s): PCO2, PO2  Studies/Results:  Anti-infectives: Anti-infectives (From admission, onward)   Start     Dose/Rate Route Frequency Ordered Stop   05/16/17 0626  cefoTEtan in Dextrose 5% (CEFOTAN) 2-2.08 GM-%(50ML) IVPB    Comments:  Carleene Cooper   : cabinet override      05/16/17 0626 05/16/17 0742   05/16/17 0624  cefoTEtan in Dextrose 5% (CEFOTAN) IVPB 2 g     2 g Intravenous On call to O.R. 05/16/17 7169 05/16/17 6789      Medications: Scheduled Meds: . enoxaparin (LOVENOX) injection  30 mg Subcutaneous Q12H  . feeding supplement  1 Container Oral Q24H  . HYDROmorphone   Intravenous Q4H  . insulin aspart  0-9 Units Subcutaneous TID WC  . multivitamin  15 mL Per Tube Daily  . pantoprazole  40 mg Oral QHS  . pregabalin  50 mg Oral TID  . protein supplement  8 oz Oral BID  . QUEtiapine  100  mg Oral QHS   Continuous Infusions: . dextrose 5 % and 0.45 % NaCl with KCl 20 mEq/L 50 mL/hr at 05/20/17 1044  . feeding supplement (JEVITY 1.2 CAL) 1,000 mL (05/20/17 0047)  . methocarbamol (ROBAXIN)  IV Stopped (05/17/17 0151)   PRN Meds:.ALPRAZolam, diphenhydrAMINE **OR** diphenhydrAMINE, lidocaine, methocarbamol (ROBAXIN)  IV, naloxone **AND** sodium chloride flush, ondansetron (ZOFRAN) IV  [name], MD Pg# [phone] [facility]

## 2017-05-20 NOTE — Progress Notes (Signed)
Texted Md to notify about CBG of 60.  Gave patient a Boost to drink and will recheck in 15 minutes.

## 2017-05-21 LAB — GLUCOSE, CAPILLARY
Glucose-Capillary: 112 mg/dL — ABNORMAL HIGH (ref 65–99)
Glucose-Capillary: 119 mg/dL — ABNORMAL HIGH (ref 65–99)
Glucose-Capillary: 121 mg/dL — ABNORMAL HIGH (ref 65–99)
Glucose-Capillary: 130 mg/dL — ABNORMAL HIGH (ref 65–99)
Glucose-Capillary: 78 mg/dL (ref 65–99)

## 2017-05-21 LAB — VITAMIN B1: Vitamin B1 (Thiamine): 57.9 nmol/L — ABNORMAL LOW (ref 66.5–200.0)

## 2017-05-21 MED ORDER — HYDROMORPHONE HCL 1 MG/ML IJ SOLN
0.5000 mg | INTRAMUSCULAR | Status: DC | PRN
  Administered 2017-05-21 – 2017-05-22 (×6): 1 mg via INTRAVENOUS
  Filled 2017-05-21 (×6): qty 1

## 2017-05-21 MED ORDER — JEVITY 1.2 CAL PO LIQD
1000.0000 mL | ORAL | Status: DC
  Administered 2017-05-21 – 2017-05-24 (×4): 1000 mL
  Filled 2017-05-21 (×6): qty 1000

## 2017-05-21 MED ORDER — OXYCODONE HCL 5 MG PO TABS
5.0000 mg | ORAL_TABLET | ORAL | Status: DC | PRN
  Administered 2017-05-21 – 2017-05-22 (×5): 10 mg via ORAL
  Filled 2017-05-21 (×5): qty 2

## 2017-05-21 NOTE — Progress Notes (Addendum)
5 Days Post-Op GJ revision and G tube placement Subjective: Had one episode of acute pain yesterday.  Taking in some PO as well.  Ambulated once  Objective: Vital signs in last 24 hours: Temp:  [98.1 F (36.7 C)-98.2 F (36.8 C)] 98.2 F (36.8 C) (01/26 0646) Pulse Rate:  [89-95] 93 (01/26 0646) Resp:  [12-18] 12 (01/26 0800) BP: (103-121)/(58-74) 104/58 (01/26 0646) SpO2:  [98 %-100 %] 100 % (01/26 0800) Weight:  [53.3 kg (117 lb 8 oz)] 53.3 kg (117 lb 8 oz) (01/26 0654)   Intake/Output from previous day: 01/25 0701 - 01/26 0700 In: 1680 [P.O.:120; I.V.:1200; NG/GT:360] Out: 1525 [Urine:1500; Drains:25] Intake/Output this shift: No intake/output data recorded.   General appearance: alert and cooperative GI: soft, non-distended Incision: no significant drainage  Lab Results:  No results for input(s): WBC, HGB, HCT, PLT in the last 72 hours. BMET Recent Labs    05/19/17 0908  NA 136  K 3.9  CL 107  CO2 24  GLUCOSE 65  BUN 7  CREATININE 0.39*  CALCIUM 8.1*   PT/INR No results for input(s): LABPROT, INR in the last 72 hours. ABG No results for input(s): PHART, HCO3 in the last 72 hours.  Invalid input(s): PCO2, PO2  MEDS, Scheduled . enoxaparin (LOVENOX) injection  30 mg Subcutaneous Q12H  . feeding supplement  1 Container Oral Q24H  . insulin aspart  0-9 Units Subcutaneous TID WC  . multivitamin  15 mL Per Tube Daily  . pantoprazole  40 mg Oral QHS  . pregabalin  50 mg Oral TID  . protein supplement  8 oz Oral BID  . QUEtiapine  100 mg Oral QHS    Studies/Results: No results found.  Assessment: s/p Procedure(s): LAPAROSCOPIC REVISION OF GASTROJEJUNOSTOMY Replacement of  GASTROSTOMY TUBE Patient Active Problem List   Diagnosis Date Noted  . 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 Symptomatic Anemia 04/25/2017  . Symptomatic anemia 04/25/2017  . Abnormal loss of weight   . Dysphagia   . S/P percutaneous  endoscopic gastrostomy (PEG) tube placement ([place]) 09/06/2016  . At risk for adverse drug event 08/25/2016  . Malnutrition following gastrointestinal surgery 08/16/2016  . Anastomotic ulcer 07/21/2016  . Transient alteration of awareness   . Alcohol use 04/27/2016  . Chronic pain syndrome 04/27/2016  . Iron deficiency anemia 04/27/2016  . B12 deficiency 04/27/2016  . Gastrojejunal anastomotic stricture   . Hypoglycemia 04/22/2016  . Syncope 04/22/2016  . Protein-calorie malnutrition, severe 04/22/2016  . Nausea and vomiting   . Abdominal pain, chronic, epigastric   . Hypotension 02/01/2016  . Hyponatremia 02/01/2016  . Subacromial impingement of left shoulder 07/06/2013  . Trochanteric bursitis of left hip 05/11/2013  . Arthritis 05/16/2012  . S/P gastric bypass 05/16/2012    Expected post op pain  Plan: d/c PCA  Try PO pain meds and use dilaudid for breakthrough Cont PO regimen Advance G tube feeds to 29ml/hr   LOS: 5 days     .Rosario Adie, [facility], [place]   05/21/2017 8:53 AM

## 2017-05-22 LAB — BASIC METABOLIC PANEL
Anion gap: 6 (ref 5–15)
BUN: 14 mg/dL (ref 6–20)
CO2: 21 mmol/L — ABNORMAL LOW (ref 22–32)
Calcium: 8.1 mg/dL — ABNORMAL LOW (ref 8.9–10.3)
Chloride: 106 mmol/L (ref 101–111)
Creatinine, Ser: 0.4 mg/dL — ABNORMAL LOW (ref 0.44–1.00)
GFR calc Af Amer: >60 mL/min (ref >60)
GFR calc non Af Amer: >60 mL/min (ref >60)
Glucose, Bld: 141 mg/dL — ABNORMAL HIGH (ref 65–99)
Potassium: 3.7 mmol/L (ref 3.5–5.1)
Sodium: 133 mmol/L — ABNORMAL LOW (ref 135–145)

## 2017-05-22 LAB — GLUCOSE, CAPILLARY
Glucose-Capillary: 154 mg/dL — ABNORMAL HIGH (ref 65–99)
Glucose-Capillary: 189 mg/dL — ABNORMAL HIGH (ref 65–99)
Glucose-Capillary: 92 mg/dL (ref 65–99)
Glucose-Capillary: 176 mg/dL — ABNORMAL HIGH (ref 65–99)
Glucose-Capillary: 104 mg/dL — ABNORMAL HIGH (ref 65–99)

## 2017-05-22 LAB — PHOSPHORUS: Phosphorus: 2.7 mg/dL (ref 2.5–4.6)

## 2017-05-22 LAB — MAGNESIUM: Magnesium: 1.5 mg/dL — ABNORMAL LOW (ref 1.7–2.4)

## 2017-05-22 MED ORDER — HYDROMORPHONE HCL 1 MG/ML IJ SOLN
0.5000 mg | INTRAMUSCULAR | Status: DC | PRN
  Administered 2017-05-22 – 2017-05-24 (×11): 0.5 mg via INTRAVENOUS
  Filled 2017-05-22 (×12): qty 0.5

## 2017-05-22 MED ORDER — HYDROMORPHONE HCL 1 MG/ML IJ SOLN: 0.5000 mg | INTRAMUSCULAR | Status: DC | PRN

## 2017-05-22 NOTE — NC FL2 (Signed)
[place] LEVEL OF CARE SCREENING TOOL     IDENTIFICATION  Patient Name: Angie [name] Birthdate: 21-Sep-1965 Sex: female Admission Date (Current Location): 05/16/2017  [facility] and [place] Number:  Herbalist and Address:  [facility],  [place] [address], [place]      Provider Number: [id number]  Attending Physician Name and Address:  Kinsinger, Arta Bruce, MD  Relative Name and Phone Number:       Current Level of Care: Hospital Recommended Level of Care: [place] Prior Approval Number:    Date Approved/Denied:   PASRR Number: [id number]  Discharge Plan: SNF    Current Diagnoses: Patient Active Problem List   Diagnosis Date Noted  . 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 Symptomatic Anemia 04/25/2017  . Symptomatic anemia 04/25/2017  . Abnormal loss of weight   . Dysphagia   . S/P percutaneous endoscopic gastrostomy (PEG) tube placement ([place]) 09/06/2016  . At risk for adverse drug event 08/25/2016  . Malnutrition following gastrointestinal surgery 08/16/2016  . Anastomotic ulcer 07/21/2016  . Transient alteration of awareness   . Alcohol use 04/27/2016  . Chronic pain syndrome 04/27/2016  . Iron deficiency anemia 04/27/2016  . B12 deficiency 04/27/2016  . Gastrojejunal anastomotic stricture   . Hypoglycemia 04/22/2016  . Syncope 04/22/2016  . Protein-calorie malnutrition, severe 04/22/2016  . Nausea and vomiting   . Abdominal pain, chronic, epigastric   . Hypotension 02/01/2016  . Hyponatremia 02/01/2016  . Subacromial impingement of left shoulder 07/06/2013  . Trochanteric bursitis of left hip 05/11/2013  . Arthritis 05/16/2012  . S/P gastric bypass 05/16/2012    Orientation RESPIRATION BLADDER Height & Weight     Self, Time, Situation, Place    Continent Weight: 132 lb 15 oz (60.3 kg) Height:  5' (152.4 cm)  BEHAVIORAL SYMPTOMS/MOOD NEUROLOGICAL  BOWEL NUTRITION STATUS      Continent Feeding tube, Diet(see DC summary)  AMBULATORY STATUS COMMUNICATION OF NEEDS Skin   Extensive Assist Verbally Surgical wounds(abdominal wound gauze dressing)                       Personal Care Assistance Level of Assistance  Bathing, Feeding, Dressing Bathing Assistance: Limited assistance   Dressing Assistance: Limited assistance     Functional Limitations Info  Sight, Hearing, Speech Sight Info: Adequate Hearing Info: Adequate Speech Info: Adequate    SPECIAL CARE FACTORS FREQUENCY  PT (By licensed PT), OT (By licensed OT)     PT Frequency: 5x/week OT Frequency: 5x/week            Contractures Contractures Info: Not present    Additional Factors Info  Insulin Sliding Scale, Psychotropic Code Status Info: Fullcode Allergies Info: Allergies: Iron Psychotropic Info: seroquel Insulin Sliding Scale Info: 3/day       Current Medications (05/22/2017):  This is the current hospital active medication list Current Facility-Administered Medications  Medication Dose Route Frequency Provider Last Rate Last Dose  . ALPRAZolam Duanne Moron) tablet 1 mg  1 mg Oral QHS PRN Kinsinger, Arta Bruce, MD      . dextrose 5 % and 0.45 % NaCl with KCl 20 mEq/L infusion   Intravenous Continuous Leighton Ruff, MD 10 mL/hr at 05/22/17 0600    . enoxaparin (LOVENOX) injection 30 mg  30 mg Subcutaneous Q12H Kinsinger, Arta Bruce, MD   30 mg at 05/22/17 0844  . feeding supplement (BOOST / RESOURCE BREEZE) liquid 1  [place] Kinsinger, Arta Bruce, MD   1 Container at 05/22/17 1204  . feeding supplement (JEVITY 1.2 CAL) liquid 1,000 mL  1,000 mL Per Tube V37S Leighton Ruff, MD 40 mL/hr at 05/22/17 1203 1,000 mL at 05/22/17 1203  . HYDROmorphone (DILAUDID) injection 0.5 mg  0.5 mg Intravenous M2L PRN Leighton Ruff, MD   0.5 mg at 05/22/17 1327  . insulin aspart (novoLOG) injection 0-9 Units  0-9 Units Subcutaneous TID WC Kinsinger, Arta Bruce, MD   2 Units at 05/22/17 0844  . lidocaine (XYLOCAINE) 5 % ointment   Topical BID PRN Kinsinger, Arta Bruce, MD      . methocarbamol (ROBAXIN) 500 mg in dextrose 5 % 50 mL IVPB  500 mg Intravenous Q6H PRN Kinsinger, Arta Bruce, MD   Stopped at 05/17/17 0151  . multivitamin liquid 15 mL  15 mL Per Tube Daily Kinsinger, Arta Bruce, MD   15 mL at 05/22/17 1026  . oxyCODONE (Oxy IR/ROXICODONE) immediate release tablet 5-10 mg  5-10 mg Oral M7E PRN Leighton Ruff, MD   10 mg at 05/22/17 1202  . pantoprazole (PROTONIX) EC tablet 40 mg  40 mg Oral QHS Eudelia Bunch, RPH   40 mg at 05/21/17 2150  . pregabalin (LYRICA) capsule 50 mg  50 mg Oral TID Kinsinger, Arta Bruce, MD   50 mg at 05/22/17 1026  . protein supplement (UNJURY CHOCOLATE CLASSIC) powder 8 oz  8 oz Oral BID Kinsinger, Arta Bruce, MD   8 oz at 05/22/17 0847  . QUEtiapine (SEROQUEL) tablet 100 mg  100 mg Oral QHS Kinsinger, Arta Bruce, MD   100 mg at 05/21/17 2150     Discharge Medications: Please see discharge summary for a list of discharge medications.  Relevant Imaging Results:  Relevant Lab Results:   Additional Information SS#: [id number]  Jorge Ny, LCSW

## 2017-05-22 NOTE — Progress Notes (Signed)
6 Days Post-Op GJ revision and G tube placement Subjective: Had a better day yesterday.  Taking in some PO as well.  Ambulated once.  Pt very somnolent this am.    Objective: Vital signs in last 24 hours: Temp:  [97.1 F (36.2 C)-99 F (37.2 C)] 97.1 F (36.2 C) (01/27 0500) Pulse Rate:  [82-103] 82 (01/27 0500) Resp:  [14-16] 16 (01/27 0500) BP: (100-116)/(58-68) 113/68 (01/27 0500) SpO2:  [99 %-100 %] 100 % (01/27 0500) Weight:  [60.3 kg (132 lb 15 oz)] 60.3 kg (132 lb 15 oz) (01/27 0500)   Intake/Output from previous day: 01/26 0701 - 01/27 0700 In: 1139.3 [P.O.:240; I.V.:369.3; NG/GT:530] Out: 2206 [Urine:2175; Drains:31] Intake/Output this shift: No intake/output data recorded.   General appearance: alert and cooperative GI: soft, non-distended Incision: no significant drainage  Lab Results:  No results for input(s): WBC, HGB, HCT, PLT in the last 72 hours. BMET Recent Labs    05/19/17 0908 05/22/17 0453  NA 136 133*  K 3.9 3.7  CL 107 106  CO2 24 21*  GLUCOSE 65 141*  BUN 7 14  CREATININE 0.39* 0.40*  CALCIUM 8.1* 8.1*   PT/INR No results for input(s): LABPROT, INR in the last 72 hours. ABG No results for input(s): PHART, HCO3 in the last 72 hours.  Invalid input(s): PCO2, PO2  MEDS, Scheduled . enoxaparin (LOVENOX) injection  30 mg Subcutaneous Q12H  . feeding supplement  1 Container Oral Q24H  . feeding supplement (JEVITY 1.2 CAL)  1,000 mL Per Tube Q24H  . insulin aspart  0-9 Units Subcutaneous TID WC  . multivitamin  15 mL Per Tube Daily  . pantoprazole  40 mg Oral QHS  . pregabalin  50 mg Oral TID  . protein supplement  8 oz Oral BID  . QUEtiapine  100 mg Oral QHS    Studies/Results: No results found.  Assessment: s/p Procedure(s): LAPAROSCOPIC REVISION OF GASTROJEJUNOSTOMY Replacement of  GASTROSTOMY TUBE Patient Active Problem List   Diagnosis Date Noted  . 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 Symptomatic Anemia 04/25/2017  . Symptomatic anemia 04/25/2017  . Abnormal loss of weight   . Dysphagia   . S/P percutaneous endoscopic gastrostomy (PEG) tube placement ([place]) 09/06/2016  . At risk for adverse drug event 08/25/2016  . Malnutrition following gastrointestinal surgery 08/16/2016  . Anastomotic ulcer 07/21/2016  . Transient alteration of awareness   . Alcohol use 04/27/2016  . Chronic pain syndrome 04/27/2016  . Iron deficiency anemia 04/27/2016  . B12 deficiency 04/27/2016  . Gastrojejunal anastomotic stricture   . Hypoglycemia 04/22/2016  . Syncope 04/22/2016  . Protein-calorie malnutrition, severe 04/22/2016  . Nausea and vomiting   . Abdominal pain, chronic, epigastric   . Hypotension 02/01/2016  . Hyponatremia 02/01/2016  . Subacromial impingement of left shoulder 07/06/2013  . Trochanteric bursitis of left hip 05/11/2013  . Arthritis 05/16/2012  . S/P gastric bypass 05/16/2012    Expected post op pain  Plan: Pt seems overly medicated.  Will decrease IV pain meds today by half Cont PO pain meds and use dilaudid for breakthrough Cont PO full liquids Cont G tube feeds at 58ml/hr   LOS: 6 days     .Rosario Adie, [facility], [place]   05/22/2017 8:15 AM

## 2017-05-23 LAB — GLUCOSE, CAPILLARY
Glucose-Capillary: 123 mg/dL — ABNORMAL HIGH (ref 65–99)
Glucose-Capillary: 146 mg/dL — ABNORMAL HIGH (ref 65–99)
Glucose-Capillary: 226 mg/dL — ABNORMAL HIGH (ref 65–99)
Glucose-Capillary: 77 mg/dL (ref 65–99)
Glucose-Capillary: 99 mg/dL (ref 65–99)

## 2017-05-23 MED ORDER — POTASSIUM CHLORIDE 10 MEQ/100ML IV SOLN
10.0000 meq | INTRAVENOUS | Status: AC
  Administered 2017-05-23 (×2): 10 meq via INTRAVENOUS
  Filled 2017-05-23 (×2): qty 100

## 2017-05-23 MED ORDER — METHOCARBAMOL 500 MG PO TABS
750.0000 mg | ORAL_TABLET | Freq: Three times a day (TID) | ORAL | Status: DC | PRN
  Administered 2017-05-23 – 2017-05-25 (×5): 750 mg via ORAL
  Filled 2017-05-23 (×5): qty 2

## 2017-05-23 MED ORDER — OXYCODONE HCL 5 MG/5ML PO SOLN
15.0000 mg | ORAL | Status: DC | PRN
  Administered 2017-05-23 – 2017-05-25 (×9): 15 mg via ORAL
  Filled 2017-05-23 (×9): qty 15

## 2017-05-23 MED ORDER — MAGNESIUM SULFATE 4 GM/100ML IV SOLN
4.0000 g | Freq: Once | INTRAVENOUS | Status: AC
  Administered 2017-05-23: 4 g via INTRAVENOUS
  Filled 2017-05-23: qty 100

## 2017-05-23 NOTE — Progress Notes (Signed)
Progress Note: General Surgery Service   Assessment/Plan: Patient Active Problem List   Diagnosis Date Noted  . 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 Symptomatic Anemia 04/25/2017  . Symptomatic anemia 04/25/2017  . Abnormal loss of weight   . Dysphagia   . S/P percutaneous endoscopic gastrostomy (PEG) tube placement ([place]) 09/06/2016  . At risk for adverse drug event 08/25/2016  . Malnutrition following gastrointestinal surgery 08/16/2016  . Anastomotic ulcer 07/21/2016  . Transient alteration of awareness   . Alcohol use 04/27/2016  . Chronic pain syndrome 04/27/2016  . Iron deficiency anemia 04/27/2016  . B12 deficiency 04/27/2016  . Gastrojejunal anastomotic stricture   . Hypoglycemia 04/22/2016  . Syncope 04/22/2016  . Protein-calorie malnutrition, severe 04/22/2016  . Nausea and vomiting   . Abdominal pain, chronic, epigastric   . Hypotension 02/01/2016  . Hyponatremia 02/01/2016  . Subacromial impingement of left shoulder 07/06/2013  . Trochanteric bursitis of left hip 05/11/2013  . Arthritis 05/16/2012  . S/P gastric bypass 05/16/2012   s/p Procedure(s): LAPAROSCOPIC REVISION OF GASTROJEJUNOSTOMY Replacement of  GASTROSTOMY TUBE 05/16/2017  Change oxycodone to liquid and increase to 15mg  Remove blake drain Change robaxin to oral medication   LOS: 7 days  Chief Complaint/Subjective: Pain issues overnight, new IV, tolerating liquids and tube feeds well  Objective: Vital signs in last 24 hours: Temp:  [98.5 F (36.9 C)-98.8 F (37.1 C)] 98.7 F (37.1 C) (01/28 1428) Pulse Rate:  [100-108] 100 (01/28 1428) Resp:  [16-20] 16 (01/28 1428) BP: (117-127)/(72-78) 127/78 (01/28 1428) SpO2:  [100 %] 100 % (01/28 1428) Weight:  [61.5 kg (135 lb 9.3 oz)] 61.5 kg (135 lb 9.3 oz) (01/28 0559) Last BM Date: (unknown)  Intake/Output from previous day: 01/27 0701 - 01/28 0700 In: 440 [I.V.:120; NG/GT:320] Out: 5  [Drains:5] Intake/Output this shift: Total I/O In: 240 [P.O.:240] Out: 20 [Drains:20]  Lungs: CTAB  Cardiovascular: RRR  Abd: soft, ATTP, blake with minimal drainage, g tube in place  Extremities: no edema  Neuro: AOx4  Lab Results: CBC  No results for input(s): WBC, HGB, HCT, PLT in the last 72 hours. BMET Recent Labs    05/22/17 0453  NA 133*  K 3.7  CL 106  CO2 21*  GLUCOSE 141*  BUN 14  CREATININE 0.40*  CALCIUM 8.1*   PT/INR No results for input(s): LABPROT, INR in the last 72 hours. ABG No results for input(s): PHART, HCO3 in the last 72 hours.  Invalid input(s): PCO2, PO2  Studies/Results:  Anti-infectives: Anti-infectives (From admission, onward)   Start     Dose/Rate Route Frequency Ordered Stop   05/16/17 0626  cefoTEtan in Dextrose 5% (CEFOTAN) 2-2.08 GM-%(50ML) IVPB    Comments:  Carleene Cooper   : cabinet override      05/16/17 0626 05/16/17 0742   05/16/17 0624  cefoTEtan in Dextrose 5% (CEFOTAN) IVPB 2 g     2 g Intravenous On call to O.R. 05/16/17 2263 05/16/17 3354      Medications: Scheduled Meds: . enoxaparin (LOVENOX) injection  30 mg Subcutaneous Q12H  . feeding supplement  1 Container Oral Q24H  . feeding supplement (JEVITY 1.2 CAL)  1,000 mL Per Tube Q24H  . insulin aspart  0-9 Units Subcutaneous TID WC  . multivitamin  15 mL Per Tube Daily  . pantoprazole  40 mg Oral QHS  . pregabalin  50 mg Oral TID  . protein supplement  8 oz Oral BID  . QUEtiapine  100 mg Oral QHS   Continuous Infusions: . dextrose 5 % and 0.45 % NaCl with KCl 20 mEq/L 10 mL/hr at 05/22/17 0600   PRN Meds:.ALPRAZolam, HYDROmorphone (DILAUDID) injection, lidocaine, methocarbamol, oxyCODONE  [name], MD Pg# [phone] [facility]

## 2017-05-23 NOTE — Care Management Important Message (Signed)
Important Message  Patient Details  Name: Angie [name] MRN: [id number] Date of Birth: [birth year]   Medicare Important Message Given:  Yes    Kerin Salen 05/23/2017, 11:12 AMImportant Message  Patient Details  Name: Angie [name] MRN: [id number] Date of Birth: 03/18/1966   Medicare Important Message Given:  Yes    Kerin Salen 05/23/2017, 11:12 AM

## 2017-05-23 NOTE — Progress Notes (Signed)
Physical Therapy Treatment Patient Details Name: Angie [name] MRN: [id number] DOB: Jan 27, 1966 Today's Date: 05/23/2017    History of Present Illness 52 yo female with gastric bypass in the past for weight loss has been dealing with a marginal ulcer leading to stenosis for the past year, S/p revision of gastric bypass and replacement of gastrostomy tube.    PT Comments    Assisted OOB to amb to bathroom then in hallway.  Slow but staedy gait with severe forward flex posture over walker due to pain.    Follow Up Recommendations  SNF     Equipment Recommendations  Rolling walker with 5" wheels    Recommendations for Other Services       Precautions / Restrictions Precautions Precaution Comments: gastrostomy tube, R JP drain Restrictions Weight Bearing Restrictions: No    Mobility  Bed Mobility Overal bed mobility: Needs Assistance Bed Mobility: Supine to Sit;Sit to Supine     Supine to sit: Supervision Sit to supine: Supervision   General bed mobility comments: increased time, supervision for lines  Transfers Overall transfer level: Needs assistance Equipment used: Rolling walker (2 wheeled) Transfers: Sit to/from Stand Sit to Stand: Min assist;Min guard         General transfer comment: assist to rise and control descent plus 25% VC's on safety with turns  Ambulation/Gait Ambulation/Gait assistance: Min guard;Supervision Ambulation Distance (Feet): 82 Feet Assistive device: Rolling walker (2 wheeled) Gait Pattern/deviations: Step-through pattern;Decreased stride length;Trunk flexed Gait velocity: decreased   General Gait Details: very slow but staedy gait.  forward flex with c/o ABD pain, tightness.     Stairs            Wheelchair Mobility    Modified Rankin (Stroke Patients Only)       Balance                                            Cognition Arousal/Alertness: Awake/alert Behavior During Therapy: WFL for tasks  assessed/performed Overall Cognitive Status: Within Functional Limits for tasks assessed                                        Exercises      General Comments        Pertinent Vitals/Pain Pain Assessment: Faces Faces Pain Scale: Hurts a little bit Pain Location: ABD "full"   "tight" Pain Descriptors / Indicators: Tightness Pain Intervention(s): Monitored during session;Repositioned    Home Living                      Prior Function            PT Goals (current goals can now be found in the care plan section) Progress towards PT goals: Progressing toward goals    Frequency    Min 3X/week      PT Plan Current plan remains appropriate    Co-evaluation              AM-PAC PT "6 Clicks" Daily Activity  Outcome Measure  Difficulty turning over in bed (including adjusting bedclothes, sheets and blankets)?: A Lot Difficulty moving from lying on back to sitting on the side of the bed? : A Lot Difficulty sitting down on and standing up from a chair with  arms (e.g., wheelchair, bedside commode, etc,.)?: Unable Help needed moving to and from a bed to chair (including a wheelchair)?: A Lot Help needed walking in hospital room?: A Lot Help needed climbing 3-5 steps with a railing? : A Lot 6 Click Score: 11    End of Session Equipment Utilized During Treatment: Gait belt Activity Tolerance: Patient limited by pain;Patient limited by fatigue Patient left: in bed;with call bell/phone within reach Nurse Communication: Mobility status PT Visit Diagnosis: Muscle weakness (generalized) (M62.81);Difficulty in walking, not elsewhere classified (R26.2)     Time: 0240-9735 PT Time Calculation (min) (ACUTE ONLY): 26 min  Charges:  $Gait Training: 8-22 mins $Therapeutic Activity: 8-22 mins                    G Codes:       Rica Koyanagi  PTA [facility]  Acute  Rehab Pager      [phone]

## 2017-05-23 NOTE — Progress Notes (Signed)
PT Cancellation Note  Patient Details Name: Angie [name] MRN: [id number] DOB: [birth year]   Cancelled Treatment:     PT attempted this am but deferred at request of pt 2* fatigue.  Pt states "maybe this afternoon"?  Will follow.   Lindsey Demonte 05/23/2017, 10:43 AM

## 2017-05-23 NOTE — Progress Notes (Signed)
Discussed with patient the need to mobilize and work towards oral pain medication versus IV pain medication.  Patient resistant to oral pain medication stating she has been on same med and dose since 2007.  I asked patient to try oral pain medication first then use IV pain medication as breakthrough medication.  PT at bedside to work with patient.  Encouraged patient to be out of bed at meal times. Patient states she would be up more often.

## 2017-05-24 LAB — GLUCOSE, CAPILLARY
Glucose-Capillary: 148 mg/dL — ABNORMAL HIGH (ref 65–99)
Glucose-Capillary: 93 mg/dL (ref 65–99)
Glucose-Capillary: 183 mg/dL — ABNORMAL HIGH (ref 65–99)
Glucose-Capillary: 61 mg/dL — ABNORMAL LOW (ref 65–99)
Glucose-Capillary: 88 mg/dL (ref 65–99)
Glucose-Capillary: 163 mg/dL — ABNORMAL HIGH (ref 65–99)
Glucose-Capillary: 104 mg/dL — ABNORMAL HIGH (ref 65–99)

## 2017-05-24 MED ORDER — OXYCODONE HCL 5 MG/5ML PO SOLN: 15.0000 mg | Freq: Four times a day (QID) | ORAL | 0 refills | Status: DC | PRN

## 2017-05-24 MED ORDER — JEVITY 1.2 CAL PO LIQD: 1000.0000 mL | ORAL | 5 refills | Status: DC

## 2017-05-24 MED ORDER — PANTOPRAZOLE SODIUM 40 MG PO TBEC: 40.0000 mg | DELAYED_RELEASE_TABLET | Freq: Every day | ORAL | 3 refills | Status: DC

## 2017-05-24 NOTE — Progress Notes (Signed)
Pt asked RN to look at the napkin in the bathroom. RN noticed a small black blood clot on a napkin. Pt stated that she is coughing up blood and to Zuercher the MD.

## 2017-05-24 NOTE — Discharge Summary (Addendum)
Physician Discharge Summary  Patient ID: Angie [name] MRN: [id number] DOB/AGE: Jan 22, 1966 52 y.o.  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 Post op she was kept NPO for 48h and then a UGi was obtained showing no leak. She was then advanced to full liquid diet and tube feeds were started. She had pain issues associated with chronic narcotic use and was put on a PCA and then weaned off the PCA. She was about to have transfer to SNF on POD 7 but had a single episode of bloody cough/vomit so she was watched overnight. She had no further episdoes. She was discharged to SNF POD 8  Consults: none  Significant Diagnostic Studies:  CBC    Component Value Date/Time   WBC 7.8 05/18/2017 0452   RBC 3.38 (L) 05/18/2017 0452   HGB 8.7 (L) 05/18/2017 0452   HCT 26.9 (L) 05/18/2017 0452   PLT 247 05/18/2017 0452   MCV 79.6 05/18/2017 0452   MCH 25.7 (L) 05/18/2017 0452   MCHC 32.3 05/18/2017 0452   RDW 23.8 (H) 05/18/2017 0452   LYMPHSABS 0.6 (L) 05/18/2017 0452   MONOABS 0.5 05/18/2017 0452   EOSABS 0.2 05/18/2017 0452   BASOSABS 0.0 05/18/2017 0452     Treatments: surgery for GJ revision  Discharge Exam: Blood pressure 104/70, pulse 99, temperature 98.6 F (37 C), temperature source Oral, resp. rate 20, height 5' (1.524 m), weight 56.7 kg (125 lb 0 oz), last menstrual period 05/09/2017, SpO2 100 %. General appearance: alert and cooperative Resp: clear to auscultation bilaterally Cardio: regular rate and rhythm, S1, S2 normal, no murmur, click, rub or gallop GI: incisions c/d/i, G tube in position  Disposition: 01-Home or Self Care  Discharge Instructions    Diet - low sodium heart healthy   Complete by:  As directed    Discharge wound care:   Complete by:  As directed    Ok to shower, maintain g tube with flushing before and after use. Maintain dry gauze around bandage. All other wounds can be left open   Increase activity slowly   Complete by:  As directed      Allergies as of 05/24/2017      Reactions   Iron Anaphylaxis   Had acute allergy with tachycardia, attention and generalized itching shortly after receiving nulecit ( iron gluconate) infusion.      Medication List    STOP taking these medications   CARAFATE 1 GM/10ML suspension Generic drug:  sucralfate   oxyCODONE-acetaminophen 10-325 MG tablet Commonly known as:  PERCOCET     TAKE these medications   ALPRAZolam 1 MG tablet Commonly known as:  XANAX Take 1 tablet (1 mg total) by mouth at bedtime as needed for anxiety. What changed:  when to take this   cyclobenzaprine 5 MG tablet Commonly known as:  FLEXERIL Take 1 tablet (5 mg total) by mouth 3 (three) times daily as needed for muscle spasms.   feeding supplement (JEVITY 1.2 CAL) Liqd Place 1,000 mLs into feeding tube daily. Put tube feeds via G tube from 6pm to 7am at a rate of 50ml/h   fexofenadine 180 MG tablet Commonly known as:  ALLEGRA Take 180 mg by mouth daily as needed for allergies.   lidocaine 5 % ointment Commonly  known as:  XYLOCAINE Apply 1 application topically 3 (three) times daily. Apply to Bilateral shoulders   nystatin cream Commonly known as:  MYCOSTATIN Apply 1 application topically daily as needed for wound care.   ondansetron 4 MG tablet Commonly known as:  ZOFRAN Take 4 mg by mouth every 6 (six) hours as needed for nausea or vomiting.   oxyCODONE 5 MG/5ML solution Commonly known as:  ROXICODONE Take 15 mLs (15 mg total) by mouth every 6 (six) hours as needed for moderate pain.   oxymetazoline 0.05 % nasal spray Commonly known as:  AFRIN Place 2 sprays into both nostrils 2 (two) times daily as needed for congestion.   pantoprazole 40 MG  tablet Commonly known as:  PROTONIX Take 1 tablet (40 mg total) by mouth at bedtime.   pregabalin 50 MG capsule Commonly known as:  LYRICA Take 50 mg by mouth 3 (three) times daily.   QUEtiapine 100 MG tablet Commonly known as:  SEROQUEL Take 1 tablet (100 mg total) by mouth at bedtime.            Discharge Care Instructions  (From admission, onward)        Start     Ordered   05/24/17 0000  Discharge wound care:    Comments:  Ok to shower, maintain g tube with flushing before and after use. Maintain dry gauze around bandage. All other wounds can be left open   05/24/17 0715       Signed: Arta Bruce Zayda Angell 05/24/2017, 2:52 PM

## 2017-05-24 NOTE — Progress Notes (Signed)
Received verbal order to D/C Lovenox injection. Will monitor pt closely.

## 2017-05-24 NOTE — Progress Notes (Addendum)
CBG 61. Hypoglycemia protocol initiated. Recheck CBG was 183. MD made aware.

## 2017-05-24 NOTE — Progress Notes (Signed)
Nutrition Follow-up  DOCUMENTATION CODES:   Severe malnutrition in context of chronic illness  INTERVENTION:   -Recommend patient continue Jevity 1.2 @ 40 ml/hr -this provides 1152 kcal and 53g of protein.  -Continue liquid MVI daily -Encouraged pt to continue oral supplements. Recommend Boost Breeze (or equivalent supplement such as Ensure Clear) BID -provides 500 kcal and 18g protein in total -Also, Unjury supplement once daily providing 100 kcal and 21g of protein.  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  Ongoing.  GOAL:   Patient will meet greater than or equal to 90% of their needs  Progressing.  MONITOR:   PO intake, Weight trends, TF tolerance, Labs, supplement acceptance  ASSESSMENT:   52 y.o. 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 S/p revision of gastric bypass and replacement of gastrostomy tube.  Per chart, possible discharge today. Currently tolerating Jevity 1.2 @ 40 ml/hr x 24 hours. Reviewed what goal rate would be if pt transitioned to 12 hour feeds at home. Pt not interested in transitioning at this time. Pt states she is to discharge to SNF. Reviewed vitamin recommendations with patient, pt states she prefers to take multivitamin orally but finds the liquid form more preferable d/t lack of teeth. Encouraged pt to find a liquid calcium supplement as well.  Encouraged pt to continue supplements, 2 Boost Breeze (or equivalent) a day with 1 Unjury supplement daily. This will help meet her nutritional needs.  Medications: Liquid MVI daily Labs  reviewed: CBGs: 93-148  Diet Order:  Diet full liquid Room service appropriate? Yes; Fluid consistency: Thin Diet - low sodium heart healthy  EDUCATION NEEDS:   No education needs have been identified at this time  Skin:  Skin Assessment: Skin Integrity Issues: Skin Integrity Issues:: Incisions Incisions: abdominal surgical incision   Last BM:  1/24  Height:   Ht Readings from Last 1 Encounters:  05/16/17 5' (1.524 m)    Weight:   Wt Readings from Last 1 Encounters:  05/24/17 125 lb 0 oz (56.7 kg)    Ideal Body Weight:  45.5 kg  BMI:  Body mass index is 24.41 kg/m.  Estimated Nutritional Needs:   Kcal:  1550-1750  Protein:  80-90  Fluid:  1.5-1.7L  Clayton Bibles, MS, RD, LDN [place] Dietitian Pager: [phone] After Hours Pager: [phone]

## 2017-05-24 NOTE — Clinical Social Work Placement (Signed)
   CLINICAL SOCIAL WORK PLACEMENT  NOTE  Date:  05/24/2017  Patient Details  Name: Monna Crean [place] MRN: [id number] Date of Birth: 1965/11/24  Clinical Social Work is seeking post-discharge placement for this patient at the [place] level of care (*CSW will initial, date and re-position this form in  chart as items are completed):  Yes   Patient/family provided with [facility] Work Department's list of facilities offering this level of care within the geographic area requested by the patient (or if unable, by the patient's family).  Yes   Patient/family informed of their freedom to choose among providers that offer the needed level of care, that participate in Medicare, Medicaid or managed care program needed by the patient, have an available bed and are willing to accept the patient.  Yes   Patient/family informed of 's ownership interest in [facility] and [facility], as well as of the fact that they are under no obligation to receive care at these facilities.  PASRR submitted to EDS on       PASRR number received on       Existing PASRR number confirmed on 05/22/17     FL2 transmitted to all facilities in geographic area requested by pt/family on       FL2 transmitted to all facilities within larger geographic area on 05/24/17     Patient informed that his/her managed care company has contracts with or will negotiate with certain facilities, including the following:        No   Patient/family informed of bed offers received.  Patient chooses bed at [facility]     Physician recommends and patient chooses bed at      Patient to be transferred to [facility] on 05/24/17.  Patient to be transferred to facility by [facility]     Patient family notified on 05/24/17 of transfer.  Name of family member notified:  Patient to notify family.      PHYSICIAN       Additional Comment:     _______________________________________________ Lia Hopping, LCSW 05/24/2017, 2:43 PM

## 2017-05-24 NOTE — Progress Notes (Signed)
Spoke with patient this am who said being discharged today.  She began using pain medication by mouth with good pain control.  No questions or requests at this time.

## 2017-05-25 ENCOUNTER — Telehealth (HOSPITAL_COMMUNITY): Payer: Self-pay

## 2017-05-25 LAB — GLUCOSE, CAPILLARY
Glucose-Capillary: 124 mg/dL — ABNORMAL HIGH (ref 65–99)
Glucose-Capillary: 79 mg/dL (ref 65–99)

## 2017-05-25 NOTE — Progress Notes (Addendum)
Per nurse patient medically stable to transfer today.  CSW confirmed facility is ready.  Physician paged to update summary. 10:45am Updated Summary sent/ [facility] called for transport.   Kathrin Greathouse, Latanya Presser, MSW Clinical Social Worker  [phone] 05/25/2017  10:31 AM

## 2017-08-06 ENCOUNTER — Emergency Department (HOSPITAL_COMMUNITY): Payer: Medicare Other

## 2017-08-06 ENCOUNTER — Encounter (HOSPITAL_COMMUNITY): Payer: Self-pay | Admitting: Emergency Medicine

## 2017-08-06 ENCOUNTER — Emergency Department (HOSPITAL_COMMUNITY)
Admission: EM | Admit: 2017-08-06 | Discharge: 2017-08-06 | Disposition: A | Discharge status: Home / Self Care | Payer: Medicare Other | Attending: Physician Assistant | Admitting: Physician Assistant

## 2017-08-06 DIAGNOSIS — G8929 Other chronic pain: Secondary | ICD-10-CM | POA: Insufficient documentation

## 2017-08-06 DIAGNOSIS — R1032 Left lower quadrant pain: Secondary | ICD-10-CM | POA: Diagnosis not present

## 2017-08-06 DIAGNOSIS — T148XXA Other injury of unspecified body region, initial encounter: Secondary | ICD-10-CM

## 2017-08-06 DIAGNOSIS — M25512 Pain in left shoulder: Secondary | ICD-10-CM | POA: Diagnosis present

## 2017-08-06 LAB — URINALYSIS, ROUTINE W REFLEX MICROSCOPIC
Bilirubin Urine: NEGATIVE
Glucose, UA: NEGATIVE mg/dL
Hgb urine dipstick: NEGATIVE
Ketones, ur: NEGATIVE mg/dL
Leukocytes, UA: NEGATIVE
Nitrite: NEGATIVE
Protein, ur: NEGATIVE mg/dL
Specific Gravity, Urine: 1.015 (ref 1.005–1.030)
pH: 7 (ref 5.0–8.0)

## 2017-08-06 LAB — COMPREHENSIVE METABOLIC PANEL
ALT: 8 U/L — ABNORMAL LOW (ref 14–54)
AST: 17 U/L (ref 15–41)
Albumin: 4.2 g/dL (ref 3.5–5.0)
Alkaline Phosphatase: 52 U/L (ref 38–126)
Anion gap: 12 (ref 5–15)
BUN: 10 mg/dL (ref 6–20)
CO2: 19 mmol/L — ABNORMAL LOW (ref 22–32)
Calcium: 9.1 mg/dL (ref 8.9–10.3)
Chloride: 107 mmol/L (ref 101–111)
Creatinine, Ser: 0.83 mg/dL (ref 0.44–1.00)
GFR calc Af Amer: >60 mL/min (ref >60)
GFR calc non Af Amer: >60 mL/min (ref >60)
Glucose, Bld: 70 mg/dL (ref 65–99)
Potassium: 3 mmol/L — ABNORMAL LOW (ref 3.5–5.1)
Sodium: 138 mmol/L (ref 135–145)
Total Bilirubin: 0.4 mg/dL (ref 0.3–1.2)
Total Protein: 7.5 g/dL (ref 6.5–8.1)

## 2017-08-06 LAB — I-STAT BETA HCG BLOOD, ED (MC, WL, AP ONLY): I-stat hCG, quantitative: <5 m[IU]/mL (ref <5)

## 2017-08-06 LAB — CBC
HCT: 29.9 % — ABNORMAL LOW (ref 36.0–46.0)
Hemoglobin: 10.1 g/dL — ABNORMAL LOW (ref 12.0–15.0)
MCH: 30.3 pg (ref 26.0–34.0)
MCHC: 33.8 g/dL (ref 30.0–36.0)
MCV: 89.8 fL (ref 78.0–100.0)
Platelets: 333 10*3/uL (ref 150–400)
RBC: 3.33 MIL/uL — ABNORMAL LOW (ref 3.87–5.11)
RDW: 14.2 % (ref 11.5–15.5)
WBC: 3.2 10*3/uL — ABNORMAL LOW (ref 4.0–10.5)

## 2017-08-06 LAB — LIPASE, BLOOD: Lipase: 20 U/L (ref 11–51)

## 2017-08-06 MED ORDER — POTASSIUM CHLORIDE NICU/PED ORAL SYRINGE 2 MEQ/ML: 40.0000 meq | Freq: Once | ORAL | Status: DC

## 2017-08-06 MED ORDER — OXYCODONE-ACETAMINOPHEN 5-325 MG PO TABS
1.0000 | ORAL_TABLET | Freq: Once | ORAL | Status: AC
  Administered 2017-08-06: 1 via ORAL
  Filled 2017-08-06: qty 1

## 2017-08-06 MED ORDER — POTASSIUM CHLORIDE 20 MEQ/15ML (10%) PO SOLN
40.0000 meq | Freq: Once | ORAL | Status: AC
  Administered 2017-08-06: 40 meq via ORAL
  Filled 2017-08-06: qty 30

## 2017-08-06 NOTE — Discharge Instructions (Addendum)
We would like you to use your home oxycodone, in addition to lidocaine patches, and the lidocaine cream that you have.  Please be sure to be gentle with herself, stay hydrated, and follow-up with your primary care physician.  Your potassium is a little bit low today, we gave you some here.  Please have potassium rich foods for the next couple days.  In addition we want you to watch for any kind of rashes that develop in the area where you are having pain.  It is possible that this is early stage of a rash illness such as shingles.

## 2017-08-06 NOTE — ED Triage Notes (Signed)
Patient here from home with complaints of left should pain. Denies fall. Also reports abdominal pain. Reports recent surgery. Hx of gastric bypass.

## 2017-08-06 NOTE — ED Provider Notes (Signed)
[place] DEPT Provider Note   CSN: [id number] Arrival date & time: 08/06/17  1024     History   Chief Complaint Chief Complaint  Patient presents with  . Abdominal Pain  . Shoulder Pain    HPI Domanique D Winstanley is a 52 y.o. female.  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  Presenting with L shoulder pain and left flank pain since yesterday.  Patient has multiple chief complaints, but would like these addressed today. Chronic pain   As for the left shoulder pain,. HO the same, Mahala Menghini gets steroid shots, has run out of insurances doesn't get those any more.  Feels the same as usual. On lyrica and 10 mg oxydone 4 times a day.   As for flank pain, sharp to the left flank, provoked by movement and then aches. No dysruia, no abdomnal pain, or fevers.     Past Medical History:  Diagnosis Date  . Abnormal Pap smear   . Alcohol abuse   . Anemia    IDA  . Anxiety    Takes xanax  . Arthritis    left hip/knees, lower spine  . Bipolar disorder ([place])   . Blood transfusion without reported diagnosis last done 04-25-17  . Bulging lumbar disc   . Bursitis    left shoulder  . Chronic lower back pain   . Cut    right middle finger with knife small cut healing pt instructed to keep clean and dry no redness or drainage  . Depression   . Diverticulitis   . Fibromyalgia   . GERD (gastroesophageal reflux disease)   . Hypertension    none since weight loss  . Lactose intolerance in adult 2017  . Migraines    "weekly @ least" (04/26/2016)  . Seizures ([place])    one Dec. 2017 due to throat closing  . Type II diabetes mellitus ([place])    "before the gastric bypass" (04/26/2016) states she no longer has diabetes    Patient Active Problem List   Diagnosis Date Noted  . 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 Symptomatic Anemia 04/25/2017  .  Symptomatic anemia 04/25/2017  . Abnormal loss of weight   . Dysphagia   . S/P percutaneous endoscopic gastrostomy (PEG) tube placement ([place]) 09/06/2016  . At risk for adverse drug event 08/25/2016  . Malnutrition following gastrointestinal surgery 08/16/2016  . Anastomotic ulcer 07/21/2016  . Transient alteration of awareness   . Alcohol use 04/27/2016  . Chronic pain syndrome 04/27/2016  . Iron deficiency anemia 04/27/2016  . B12 deficiency 04/27/2016  . Gastrojejunal anastomotic stricture   . Hypoglycemia 04/22/2016  . Syncope 04/22/2016  . Protein-calorie malnutrition, severe 04/22/2016  . Nausea and vomiting   . Abdominal pain, chronic, epigastric   . Hypotension 02/01/2016  . Hyponatremia 02/01/2016  . Subacromial impingement of left shoulder 07/06/2013  . Trochanteric bursitis of left hip 05/11/2013  . Arthritis 05/16/2012  . S/P gastric bypass 05/16/2012    Past Surgical History:  Procedure Laterality Date  . BALLOON DILATION N/A 05/27/2016   Procedure: BALLOON DILATION;  Surgeon: Doran Stabler, MD;  Location: [facility];  Service: Gastroenterology;  Laterality: N/A;  . BALLOON DILATION N/A 04/21/2017   Procedure: BALLOON DILATION;  Surgeon: Doran Stabler, MD;  Location: [facility];  Service: Gastroenterology;  Laterality: N/A;  . CERVICAL  CONE BIOPSY    . [place]; 1996; 1998; 2014  . CESAREAN SECTION WITH BILATERAL TUBAL LIGATION Bilateral 10/28/2012   Procedure: Repeat cesarean section with delivery of baby boy at 68. Apgars 8/9.  BILATERAL TUBAL LIGATION;  Surgeon: Florian Buff, MD;  Location: [facility];  Service: Obstetrics;  Laterality: Bilateral;  . DILATION AND CURETTAGE OF UTERUS    . ESOPHAGOGASTRODUODENOSCOPY N/A 05/27/2016   Procedure: ESOPHAGOGASTRODUODENOSCOPY (EGD);  Surgeon: Doran Stabler, MD;  Location: [facility];  Service: Gastroenterology;  Laterality: N/A;  . ESOPHAGOGASTRODUODENOSCOPY (EGD) WITH PROPOFOL N/A 04/23/2016    Procedure: ESOPHAGOGASTRODUODENOSCOPY (EGD) WITH PROPOFOL;  Surgeon: Doran Stabler, MD;  Location: [facility];  Service: Endoscopy;  Laterality: N/A;  . ESOPHAGOGASTRODUODENOSCOPY (EGD) WITH PROPOFOL N/A 01/03/2017   Procedure: ESOPHAGOGASTRODUODENOSCOPY (EGD) WITH PROPOFOL;  Surgeon: Doran Stabler, MD;  Location: [facility];  Service: Gastroenterology;  Laterality: N/A;  . ESOPHAGOGASTRODUODENOSCOPY (EGD) WITH PROPOFOL N/A 04/21/2017   Procedure: ESOPHAGOGASTRODUODENOSCOPY (EGD) WITH PROPOFOL;  Surgeon: Doran Stabler, MD;  Location: [facility];  Service: Gastroenterology;  Laterality: N/A;  . GASTROSTOMY N/A 08/16/2016   Procedure: LAPAROSCOPIC INSERTION OF GASTROSTOMY TUBE IN REMNANT STOMACH;  Surgeon: Arta Bruce Kinsinger, MD;  Location: [facility];  Service: General;  Laterality: N/A;  . GASTROSTOMY N/A 05/16/2017   Procedure: Replacement of  GASTROSTOMY TUBE;  Surgeon: Kieth Brightly Arta Bruce, MD;  Location: [facility];  Service: General;  Laterality: N/A;  . LAPAROSCOPIC REVISION OF GASTROJEJUNOSTOMY Left 05/16/2017   Procedure: LAPAROSCOPIC REVISION OF GASTROJEJUNOSTOMY;  Surgeon: [name], MD;  Location: [facility];  Service: General;  Laterality: Left;  . ROUX-EN-Y GASTRIC BYPASS  2007  . TUBAL LIGATION  2014     OB History    Gravida  6   Para  4   Term  4   Preterm  0   AB  2   Living  4     SAB  2   TAB      Ectopic      Multiple      Live Births  4            Home Medications    Prior to Admission medications   Medication Sig Start Date End Date Taking? Authorizing Provider  ALPRAZolam Duanne Moron) 1 MG tablet Take 1 tablet (1 mg total) by mouth at bedtime as needed for anxiety. Patient taking differently: Take 1 mg by mouth 3 (three) times daily.  08/27/16   Lauree Chandler, NP  cyclobenzaprine (FLEXERIL) 5 MG tablet Take 1 tablet (5 mg total) by mouth 3 (three) times daily as needed for muscle spasms. Patient not taking: Reported on  03/31/2017 08/20/16   Kinsinger, Arta Bruce, MD  fexofenadine (ALLEGRA) 180 MG tablet Take 180 mg by mouth daily as needed for allergies.     [provider]  lidocaine (XYLOCAINE) 5 % ointment Apply 1 application topically 3 (three) times daily. Apply to Bilateral shoulders 05/11/16   [provider]  Nutritional Supplements (FEEDING SUPPLEMENT, JEVITY 1.2 CAL,) LIQD Place 1,000 mLs into feeding tube daily. Put tube feeds via G tube from 6pm to 7am at a rate of 67ml/h 05/24/17   Kinsinger, Arta Bruce, MD  nystatin cream (MYCOSTATIN) Apply 1 application topically daily as needed for wound care. 01/21/17   [provider]  ondansetron (ZOFRAN) 4 MG tablet Take 4 mg by mouth every 6 (six) hours as needed for nausea or vomiting.  [provider]  oxyCODONE (ROXICODONE) 5 MG/5ML solution Take 15 mLs (15 mg total) by mouth every 6 (six) hours as needed for moderate pain. 05/24/17   Kinsinger, Arta Bruce, MD  oxymetazoline (AFRIN) 0.05 % nasal spray Place 2 sprays into both nostrils 2 (two) times daily as needed for congestion.    [provider]  pantoprazole (PROTONIX) 40 MG tablet Take 1 tablet (40 mg total) by mouth at bedtime. 05/24/17   Kinsinger, Arta Bruce, MD  pregabalin (LYRICA) 50 MG capsule Take 50 mg by mouth 3 (three) times daily.    [provider]  QUEtiapine (SEROQUEL) 100 MG tablet Take 1 tablet (100 mg total) by mouth at bedtime. 07/25/16 04/25/17  Roxan Hockey, MD    Family History Family History  Problem Relation Age of Onset  . Diabetes Father   . Heart disease Father   . Depression Maternal Grandmother   . Heart disease Maternal Grandfather   . Depression Paternal Grandmother   . Colon cancer Paternal Grandfather   . Pancreatic cancer Paternal Grandfather     Social History Social History   Tobacco Use  . Smoking status: Former Smoker    Packs/day: 0.50    Years: 4.00    Pack years: 2.00    Types: Cigarettes    Last  attempt to quit: 03/16/2012    Years since quitting: 5.3  . Smokeless tobacco: Never Used  Substance Use Topics  . Alcohol use: Yes    Alcohol/week: 10.2 oz    Types: 6 Glasses of wine, 11 Shots of liquor per week    Comment: alcohol tx none x 1 year  . Drug use: No     Allergies   Iron   Review of Systems Review of Systems  Constitutional: Negative for fatigue and fever.  Gastrointestinal: Negative for abdominal pain, nausea and vomiting.  Genitourinary: Positive for flank pain.  Neurological: Negative for dizziness and light-headedness.  All other systems reviewed and are negative.    Physical Exam Updated Vital Signs BP 132/90 (BP Location: Left Arm)   Pulse 93   Temp 98 F (36.7 C) (Oral)   Resp 18   SpO2 96%   Physical Exam  Constitutional: She is oriented to person, place, and time. She appears well-developed and well-nourished.  HENT:  Head: Normocephalic and atraumatic.  Eyes: Right eye exhibits no discharge.  Cardiovascular: Normal rate.  Pulmonary/Chest: Effort normal.  Abdominal: Normal appearance. There is tenderness. There is CVA tenderness.  Scars to abdomen, no focal tenderness, diffuse  Neurological: She is oriented to person, place, and time.  Pain to the left shoulder through range of motion.  Skin: Skin is warm and dry. She is not diaphoretic.  Psychiatric: She has a normal mood and affect.  Nursing note and vitals reviewed.    ED Treatments / Results  Labs (all labs ordered are listed, but only abnormal results are displayed) Labs Reviewed  LIPASE, BLOOD  COMPREHENSIVE METABOLIC PANEL  CBC  URINALYSIS, ROUTINE W REFLEX MICROSCOPIC  I-STAT BETA HCG BLOOD, ED ([facility], [facility], [facility] ONLY)    EKG None  Radiology No results found.  Procedures Procedures (including critical care time)  Medications Ordered in ED Medications - No data to display   Initial Impression / Assessment and Plan / ED Course  I have reviewed the triage vital signs  and the nursing notes.  Pertinent labs & imaging results that were available during my care of the patient were reviewed by me and considered  in my medical decision making (see chart for details).     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  Presenting with L shoulder pain and left flank pain since yesterday.  Patient has multiple chief complaints, but would like these addressed today. Chronic pain   As for the left shoulder pain,. HO the same, Mahala Menghini gets steroid shots, has run out of insurances doesn't get those any more.  Feels the same as usual. On lyrica and 10 mg oxydone 4 times a day.   As for flank pain, sharp to the left flank, provoked by movement and then aches. No dysruia, no abdomnal pain, or fevers.  Patient has pain to palpation all along the left lateral paraspinal muscles into the left flank.  Patient reports that she is tried lidocaine patches, cyclobenzaprine, oxycodone, heat, ice but she is still having it bother her.  11:12 AM Patient's physical exam is very reassuring.  I suspect that this is musculoskeletal in nature.  Will get labs given patient's complicated abdominal medical history.  Will also get urine.  Patient is currently being treated for yeast infection.  Patient's vital signs are normal.  Patient has history of chronic pain and I believe this is acute exacerbation of such.  As such I do not feel comfortable offering any kind of pain medications that are narcotic in nature.  Will encourage her to use the over-the-counter lidocaine cream, lidocaine patches.   No evidence of rash.   1:57 PM Patient taking p.o., normal vital signs urine and labs are reassuring.  Gave patient a little p.o. potassium.  We will have her follow-up with her primary care physician.  Final Clinical Impressions(s) / ED Diagnoses   Final diagnoses:  None    ED Discharge Orders    None       Takasha Vetere, Fredia Sorrow,  MD 08/06/17 1357

## 2017-08-11 ENCOUNTER — Other Ambulatory Visit: Payer: Self-pay | Admitting: General Surgery

## 2017-08-11 DIAGNOSIS — Z9884 Bariatric surgery status: Secondary | ICD-10-CM

## 2017-08-12 ENCOUNTER — Ambulatory Visit
Admission: RE | Admit: 2017-08-12 | Discharge: 2017-08-12 | Disposition: A | Discharge status: Home / Self Care | Payer: Medicare Other | Source: Ambulatory Visit | Attending: General Surgery | Admitting: General Surgery

## 2017-08-12 DIAGNOSIS — Z9884 Bariatric surgery status: Secondary | ICD-10-CM

## 2017-08-12 MED ORDER — IOPAMIDOL (ISOVUE-300) INJECTION 61%
90.0000 mL | Freq: Once | INTRAVENOUS | Status: AC | PRN
  Administered 2017-08-12: 90 mL via INTRAVENOUS

## 2017-08-13 ENCOUNTER — Emergency Department (HOSPITAL_COMMUNITY)
Admission: EM | Admit: 2017-08-13 | Discharge: 2017-08-13 | Disposition: A | Discharge status: Home / Self Care | Payer: Medicare Other | Attending: Emergency Medicine | Admitting: Emergency Medicine

## 2017-08-13 DIAGNOSIS — Z5321 Procedure and treatment not carried out due to patient leaving prior to being seen by health care provider: Secondary | ICD-10-CM | POA: Insufficient documentation

## 2017-08-13 DIAGNOSIS — R42 Dizziness and giddiness: Secondary | ICD-10-CM | POA: Insufficient documentation

## 2017-08-13 NOTE — ED Triage Notes (Addendum)
Pt states she has been feeling dizzy for several days, noted today bp was in 53'Y dystolic then 05'R at 1021. Also chronic pain in shoulder and back. BP 111/84 in triage. Pt decided to leave after bp checked within normal range.

## 2017-09-24 ENCOUNTER — Other Ambulatory Visit: Payer: Self-pay

## 2017-09-24 ENCOUNTER — Emergency Department (HOSPITAL_COMMUNITY)
Admission: EM | Admit: 2017-09-24 | Discharge: 2017-09-24 | Disposition: A | Discharge status: Home / Self Care | Payer: Medicare Other | Attending: Emergency Medicine | Admitting: Emergency Medicine

## 2017-09-24 ENCOUNTER — Encounter (HOSPITAL_COMMUNITY): Payer: Self-pay

## 2017-09-24 DIAGNOSIS — Y929 Unspecified place or not applicable: Secondary | ICD-10-CM | POA: Diagnosis not present

## 2017-09-24 DIAGNOSIS — Z9884 Bariatric surgery status: Secondary | ICD-10-CM | POA: Diagnosis not present

## 2017-09-24 DIAGNOSIS — Y33XXXA Other specified events, undetermined intent, initial encounter: Secondary | ICD-10-CM | POA: Diagnosis not present

## 2017-09-24 DIAGNOSIS — I10 Essential (primary) hypertension: Secondary | ICD-10-CM | POA: Insufficient documentation

## 2017-09-24 DIAGNOSIS — S0592XA Unspecified injury of left eye and orbit, initial encounter: Secondary | ICD-10-CM | POA: Diagnosis present

## 2017-09-24 DIAGNOSIS — Z79899 Other long term (current) drug therapy: Secondary | ICD-10-CM | POA: Diagnosis not present

## 2017-09-24 DIAGNOSIS — Y999 Unspecified external cause status: Secondary | ICD-10-CM | POA: Diagnosis not present

## 2017-09-24 DIAGNOSIS — Y939 Activity, unspecified: Secondary | ICD-10-CM | POA: Diagnosis not present

## 2017-09-24 DIAGNOSIS — Z87891 Personal history of nicotine dependence: Secondary | ICD-10-CM | POA: Diagnosis not present

## 2017-09-24 DIAGNOSIS — S0502XA Injury of conjunctiva and corneal abrasion without foreign body, left eye, initial encounter: Secondary | ICD-10-CM | POA: Diagnosis not present

## 2017-09-24 DIAGNOSIS — E119 Type 2 diabetes mellitus without complications: Secondary | ICD-10-CM | POA: Insufficient documentation

## 2017-09-24 MED ORDER — TETRACAINE HCL 0.5 % OP SOLN
2.0000 [drp] | Freq: Once | OPHTHALMIC | Status: AC
  Administered 2017-09-24: 2 [drp] via OPHTHALMIC
  Filled 2017-09-24: qty 4

## 2017-09-24 MED ORDER — ACETAMINOPHEN 325 MG PO TABS
650.0000 mg | ORAL_TABLET | Freq: Once | ORAL | Status: AC
  Administered 2017-09-24: 650 mg via ORAL
  Filled 2017-09-24: qty 2

## 2017-09-24 MED ORDER — FLUORESCEIN SODIUM 1 MG OP STRP
1.0000 | ORAL_STRIP | Freq: Once | OPHTHALMIC | Status: AC
Start: 2017-09-24 — End: 2017-09-24
  Administered 2017-09-24: 1 via OPHTHALMIC
  Filled 2017-09-24: qty 1

## 2017-09-24 MED ORDER — ERYTHROMYCIN 5 MG/GM OP OINT: TOPICAL_OINTMENT | OPHTHALMIC | 0 refills | Status: DC

## 2017-09-24 NOTE — ED Provider Notes (Signed)
[facility] DEPT Provider Note   CSN: [id number] Arrival date & time: 09/24/17  1342     History   Chief Complaint Chief Complaint  Patient presents with  . Eye Problem    HPI Angie [name] is a 52 y.o. female with history of alcohol abuse, arthritis, bipolar disorder, chronic low back pain, diverticulitis, fibromyalgia, GERD, hypertension, type 2 diabetes mellitus presents for evaluation of acute onset, progressively worsening left eye erythema and pain for 3 days.  She notes a constant burning sensation to the left eye with associated sensitivity to light and clear tearful drainage.  She does note waking up with her eye matted shut with yellow crusting around the eye.  She denies fevers or chills.  She denies any known trauma to the eye.  She does not wear contact lenses.  She does note intermittently blurry vision.  Has not tried anything for her symptoms.  The history is provided by the patient.    Past Medical History:  Diagnosis Date  . Abnormal Pap smear   . Alcohol abuse   . Anemia    IDA  . Anxiety    Takes xanax  . Arthritis    left hip/knees, lower spine  . Bipolar disorder ([place])   . Blood transfusion without reported diagnosis last done 04-25-17  . Bulging lumbar disc   . Bursitis    left shoulder  . Chronic lower back pain   . Cut    right middle finger with knife small cut healing pt instructed to keep clean and dry no redness or drainage  . Depression   . Diverticulitis   . Fibromyalgia   . GERD (gastroesophageal reflux disease)   . Hypertension    none since weight loss  . Lactose intolerance in adult 2017  . Migraines    "weekly @ least" (04/26/2016)  . Seizures ([place])    one Dec. 2017 due to throat closing  . Type II diabetes mellitus ([place])    "before the gastric bypass" (04/26/2016) states she no longer has diabetes    Patient Active Problem List   Diagnosis Date Noted  . 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 Symptomatic Anemia 04/25/2017  . Symptomatic anemia 04/25/2017  . Abnormal loss of weight   . Dysphagia   . S/P percutaneous endoscopic gastrostomy (PEG) tube placement ([place]) 09/06/2016  . At risk for adverse drug event 08/25/2016  . Malnutrition following gastrointestinal surgery 08/16/2016  . Anastomotic ulcer 07/21/2016  . Transient alteration of awareness   . Alcohol use 04/27/2016  . Chronic pain syndrome 04/27/2016  . Iron deficiency anemia 04/27/2016  . B12 deficiency 04/27/2016  . Gastrojejunal anastomotic stricture   . Hypoglycemia 04/22/2016  . Syncope 04/22/2016  . Protein-calorie malnutrition, severe 04/22/2016  . Nausea and vomiting   . Abdominal pain, chronic, epigastric   . Hypotension 02/01/2016  . Hyponatremia 02/01/2016  . Subacromial impingement of left shoulder 07/06/2013  . Trochanteric bursitis of left hip 05/11/2013  . Arthritis 05/16/2012  . S/P gastric bypass 05/16/2012    Past Surgical History:  Procedure Laterality Date  . BALLOON DILATION N/A 05/27/2016   Procedure: BALLOON DILATION;  Surgeon: Doran Stabler, MD;  Location: [facility];  Service: Gastroenterology;  Laterality: N/A;  . BALLOON DILATION N/A 04/21/2017   Procedure: BALLOON DILATION;  Surgeon: Doran Stabler, MD;  Location: [facility];  Service: Gastroenterology;  Laterality: N/A;  . CERVICAL CONE BIOPSY    .  [place]; 1996; 1998; 2014  . CESAREAN SECTION WITH BILATERAL TUBAL LIGATION Bilateral 10/28/2012   Procedure: Repeat cesarean section with delivery of baby boy at 102. Apgars 8/9.  BILATERAL TUBAL LIGATION;  Surgeon: Florian Buff, MD;  Location: [facility];  Service: Obstetrics;  Laterality: Bilateral;  . DILATION AND CURETTAGE OF UTERUS    . ESOPHAGOGASTRODUODENOSCOPY N/A 05/27/2016   Procedure: ESOPHAGOGASTRODUODENOSCOPY (EGD);  Surgeon: Doran Stabler, MD;  Location: [facility];  Service: Gastroenterology;  Laterality: N/A;    . ESOPHAGOGASTRODUODENOSCOPY (EGD) WITH PROPOFOL N/A 04/23/2016   Procedure: ESOPHAGOGASTRODUODENOSCOPY (EGD) WITH PROPOFOL;  Surgeon: Doran Stabler, MD;  Location: [facility];  Service: Endoscopy;  Laterality: N/A;  . ESOPHAGOGASTRODUODENOSCOPY (EGD) WITH PROPOFOL N/A 01/03/2017   Procedure: ESOPHAGOGASTRODUODENOSCOPY (EGD) WITH PROPOFOL;  Surgeon: Doran Stabler, MD;  Location: [facility];  Service: Gastroenterology;  Laterality: N/A;  . ESOPHAGOGASTRODUODENOSCOPY (EGD) WITH PROPOFOL N/A 04/21/2017   Procedure: ESOPHAGOGASTRODUODENOSCOPY (EGD) WITH PROPOFOL;  Surgeon: Doran Stabler, MD;  Location: [facility];  Service: Gastroenterology;  Laterality: N/A;  . GASTROSTOMY N/A 08/16/2016   Procedure: LAPAROSCOPIC INSERTION OF GASTROSTOMY TUBE IN REMNANT STOMACH;  Surgeon: Arta Bruce Kinsinger, MD;  Location: [facility];  Service: General;  Laterality: N/A;  . GASTROSTOMY N/A 05/16/2017   Procedure: Replacement of  GASTROSTOMY TUBE;  Surgeon: Kieth Brightly Arta Bruce, MD;  Location: [facility];  Service: General;  Laterality: N/A;  . LAPAROSCOPIC REVISION OF GASTROJEJUNOSTOMY Left 05/16/2017   Procedure: LAPAROSCOPIC REVISION OF GASTROJEJUNOSTOMY;  Surgeon: [name], MD;  Location: [facility];  Service: General;  Laterality: Left;  . ROUX-EN-Y GASTRIC BYPASS  2007  . TUBAL LIGATION  2014     OB History    Gravida  6   Para  4   Term  4   Preterm  0   AB  2   Living  4     SAB  2   TAB      Ectopic      Multiple      Live Births  4            Home Medications    Prior to Admission medications   Medication Sig Start Date End Date Taking? Authorizing Provider  ALPRAZolam Duanne Moron) 1 MG tablet Take 1 tablet (1 mg total) by mouth at bedtime as needed for anxiety. Patient taking differently: Take 1 mg by mouth 3 (three) times daily.  08/27/16   Lauree Chandler, NP  cyclobenzaprine (FLEXERIL) 5 MG tablet Take 1 tablet (5 mg total) by mouth 3 (three) times daily  as needed for muscle spasms. Patient not taking: Reported on 03/31/2017 08/20/16   Kinsinger, Arta Bruce, MD  erythromycin ophthalmic ointment Place a 1/2 inch ribbon of ointment into the lower eyelid 4 times daily for 7 days 09/24/17   Rodell Perna A, PA-C  fexofenadine (ALLEGRA) 180 MG tablet Take 180 mg by mouth daily as needed for allergies.     [provider]  lidocaine (XYLOCAINE) 5 % ointment Apply 1 application topically 3 (three) times daily. Apply to Bilateral shoulders 05/11/16   [provider]  Nutritional Supplements (FEEDING SUPPLEMENT, JEVITY 1.2 CAL,) LIQD Place 1,000 mLs into feeding tube daily. Put tube feeds via G tube from 6pm to 7am at a rate of 60ml/h 05/24/17   Kinsinger, Arta Bruce, MD  nystatin cream (MYCOSTATIN) Apply 1 application topically daily as needed for wound care. 01/21/17   [provider]  ondansetron (ZOFRAN) 4 MG tablet Take 4 mg by mouth every 6 (six) hours as needed for nausea or vomiting.    [provider]  oxyCODONE (ROXICODONE) 5 MG/5ML solution Take 15 mLs (15 mg total) by mouth every 6 (six) hours as needed for moderate pain. 05/24/17   Kinsinger, Arta Bruce, MD  oxymetazoline (AFRIN) 0.05 % nasal spray Place 2 sprays into both nostrils 2 (two) times daily as needed for congestion.    [provider]  pantoprazole (PROTONIX) 40 MG tablet Take 1 tablet (40 mg total) by mouth at bedtime. 05/24/17   Kinsinger, Arta Bruce, MD  pregabalin (LYRICA) 50 MG capsule Take 50 mg by mouth 3 (three) times daily.    [provider]  QUEtiapine (SEROQUEL) 100 MG tablet Take 1 tablet (100 mg total) by mouth at bedtime. 07/25/16 04/25/17  Roxan Hockey, MD    Family History Family History  Problem Relation Age of Onset  . Diabetes Father   . Heart disease Father   . Depression Maternal Grandmother   . Heart disease Maternal Grandfather   . Depression Paternal Grandmother   . Colon cancer Paternal Grandfather   .  Pancreatic cancer Paternal Grandfather     Social History Social History   Tobacco Use  . Smoking status: Former Smoker    Packs/day: 0.50    Years: 4.00    Pack years: 2.00    Types: Cigarettes    Last attempt to quit: 03/16/2012    Years since quitting: 5.5  . Smokeless tobacco: Never Used  Substance Use Topics  . Alcohol use: Yes    Alcohol/week: 10.2 oz    Types: 6 Glasses of wine, 11 Shots of liquor per week    Comment: alcohol tx none x 1 year  . Drug use: No     Allergies   Iron   Review of Systems Review of Systems  Constitutional: Negative for chills and fever.  Eyes: Positive for photophobia, pain, discharge, redness, itching and visual disturbance.  All other systems reviewed and are negative.    Physical Exam Updated Vital Signs BP (!) 159/105 (BP Location: Left Arm)   Pulse 73   Temp 98.2 F (36.8 C) (Oral)   Resp 18   LMP  (LMP Unknown)   SpO2 99%   Physical Exam  Constitutional: She appears well-developed and well-nourished. No distress.  HENT:  Head: Normocephalic and atraumatic.  Eyes: Pupils are equal, round, and reactive to light. EOM are normal. Right eye exhibits no discharge. Left eye exhibits discharge.  Left eye with injected conjunctivae.   There is no chemosis, proptosis, or consensual photophobia.  No pain with EOMs.  No foreign bodies noted.  There is no swelling or erythema or tenderness of the eyelids.  There is clear tearful drainage coming from the right eye.  Left eye IOP 14  On fluorescein stain, there is a small amount of uptake suggestive of corneal abrasion in the inferior temporal pole of the cornea.  No dendritic lesions, foreign bodies, or rust rings.  Seidel sign is absent.    Visual Acuity  Right Eye Distance: 20/30(Uncorrected) Left Eye Distance: 20/50(Uncorrected) Bilateral Distance: 20/30(Uncorrected)   Neck: Normal range of motion. Neck supple. No JVD present. No tracheal deviation present.  Cardiovascular:  Normal rate.  Pulmonary/Chest: Effort normal.  Abdominal: She exhibits no distension.  Musculoskeletal: She exhibits no edema.  Neurological: She is alert.  Skin: No erythema.  Psychiatric: She has a normal mood and affect. Her  behavior is normal.  Nursing note and vitals reviewed.    ED Treatments / Results  Labs (all labs ordered are listed, but only abnormal results are displayed) Labs Reviewed - No data to display  EKG None  Radiology No results found.  Procedures Procedures (including critical care time)  Medications Ordered in ED Medications  tetracaine (PONTOCAINE) 0.5 % ophthalmic solution 2 drop (2 drops Left Eye Given 09/24/17 1556)  fluorescein ophthalmic strip 1 strip (1 strip Left Eye Given 09/24/17 1556)  acetaminophen (TYLENOL) tablet 650 mg (650 mg Oral Given 09/24/17 1556)     Initial Impression / Assessment and Plan / ED Course  I have reviewed the triage vital signs and the nursing notes.  Pertinent labs & imaging results that were available during my care of the patient were reviewed by me and considered in my medical decision making (see chart for details).   Pt with corneal abrasion on PE.  She is afebrile, hypertensive while in the ED but states "I have not had my home pain medicine and my blood pressure goes up when this happens ".  She is resting comfortably in no apparent distress.  Her tetanus is up-to-date.  No evidence of FB. Suspect worse visual acuity in the left eye likely due to excessive tearing. Pt is not a contact lens wearer.  Exam non-concerning for orbital cellulitis, hyphema, corneal ulcers. Patient will be discharged home with erythromycin ointment.   Patient understands to follow up with ophthalmology within 48 hours, & to return to ER if new symptoms develop including change in vision, purulent drainage, or entrapment.  Final Clinical Impressions(s) / ED Diagnoses   Final diagnoses:  Abrasion of left cornea, initial encounter    ED  Discharge Orders        Ordered    erythromycin ophthalmic ointment     09/24/17 1618       Renita Papa, PA-C 09/24/17 1845    Daleen Bo, MD 09/25/17 1554

## 2017-09-24 NOTE — ED Triage Notes (Signed)
She c/o left eye "pinkeye". She denies any other health problems/issues at this time.

## 2017-09-24 NOTE — Discharge Instructions (Signed)
Take antibiotic ointment to completion.  You may apply cool compresses for comfort.  You may also take your home pain medicines for pain.  You may use over-the-counter eyedrops for itching/burning sensation.Wash her hands frequently.  Follow-up with an ophthalmologist in the next 24 to 48 hours for reevaluation of your symptoms.  Return to the emergency department if any concerning signs or symptoms develop such as severe swelling or redness of the eyelids, high fevers, vision changes.

## 2018-01-18 ENCOUNTER — Emergency Department (HOSPITAL_COMMUNITY)
Admission: EM | Admit: 2018-01-18 | Discharge: 2018-01-18 | Disposition: A | Discharge status: Home / Self Care | Payer: Medicare Other | Attending: Emergency Medicine | Admitting: Emergency Medicine

## 2018-01-18 ENCOUNTER — Encounter (HOSPITAL_COMMUNITY): Payer: Self-pay | Admitting: Emergency Medicine

## 2018-01-18 ENCOUNTER — Emergency Department (HOSPITAL_COMMUNITY): Payer: Medicare Other

## 2018-01-18 ENCOUNTER — Other Ambulatory Visit: Payer: Self-pay

## 2018-01-18 DIAGNOSIS — Z87891 Personal history of nicotine dependence: Secondary | ICD-10-CM | POA: Insufficient documentation

## 2018-01-18 DIAGNOSIS — Z79899 Other long term (current) drug therapy: Secondary | ICD-10-CM | POA: Insufficient documentation

## 2018-01-18 DIAGNOSIS — M25512 Pain in left shoulder: Secondary | ICD-10-CM | POA: Insufficient documentation

## 2018-01-18 DIAGNOSIS — I10 Essential (primary) hypertension: Secondary | ICD-10-CM | POA: Diagnosis not present

## 2018-01-18 MED ORDER — PREDNISONE 10 MG (21) PO TBPK: ORAL_TABLET | Freq: Every day | ORAL | 0 refills | Status: DC

## 2018-01-18 NOTE — ED Triage Notes (Signed)
Left shoulder pain post fall 4 days ago; bruising noted.

## 2018-01-18 NOTE — Discharge Instructions (Signed)
1. Medications: Take steroid taper as prescribed.  Do not take any ibuprofen, Advil, Aleve, or Motrin while taking this medication.  You can take [phone] mg of Tylenol every 6 hours as needed for pain. Do not exceed 4000 mg of Tylenol daily.   2. Treatment: rest, ice or heat for comfort, elevate and use brace, drink plenty of fluids, gentle stretching (see attached exercises or you can you to shoulder physical therapy videos).  Make sure to take your arm out of the sling 2-3 times daily and do some gentle stretching to avoid muscle stiffness. 3. Follow Up: Please followup with orthopedics as directed or your PCP in 1 week if no improvement for discussion of your diagnoses and further evaluation after today's visit; if you do not have a primary care doctor use the resource guide provided to find one; Please return to the ER for worsening symptoms or other concerns such as worsening swelling, redness of the skin, fevers, loss of pulses, or loss of feeling

## 2018-01-18 NOTE — ED Provider Notes (Signed)
[place] DEPT Provider Note   CSN: [id number] Arrival date & time: 01/18/18  1025     History   Chief Complaint Chief Complaint  Patient presents with  . Fall  . Shoulder Pain    HPI Angie [name] is a 52 y.o. female with history of alcohol abuse, anxiety, arthritis, fibromyalgia, bipolar disorder, bursitis, diverticulitis, GERD, hypertension, migraines, seizures, diabetes mellitus status post gastric bypass presents today for evaluation of acute onset, progressively worsening left shoulder pain status post fall 4 days ago.  She states that she took her Seroquel in the evening to help her sleep and then went to go to the restroom to urinate when she began feeling drowsy from the medication.  She states that on her way to her bedroom she tripped and struck her left shoulder on something in the bathroom.  Denies loss of consciousness.  She did have some headaches after the fall but these have entirely resolved.  She is not on blood thinners.  She has constant aching throbbing pain to the left shoulder which occasionally becomes sharp and stabbing.  Pain worsens with movement of the shoulder particularly flexion.  Pain will radiate down the upper extremity and she notes intermittent numbness and tingling of her left hand.  She has been taking her home oxycodone every 6 hours as prescribed without significant relief in her symptoms.  No fevers or chills.  He is currently in the process of being set up with follow-up with [facility] orthopedics.  The history is provided by the patient.    Past Medical History:  Diagnosis Date  . Abnormal Pap smear   . Alcohol abuse   . Anemia    IDA  . Anxiety    Takes xanax  . Arthritis    left hip/knees, lower spine  . Bipolar disorder ([place])   . Blood transfusion without reported diagnosis last done 04-25-17  . Bulging lumbar disc   . Bursitis    left shoulder  . Chronic lower back pain   . Cut    right middle  finger with knife small cut healing pt instructed to keep clean and dry no redness or drainage  . Depression   . Diverticulitis   . Fibromyalgia   . GERD (gastroesophageal reflux disease)   . Hypertension    none since weight loss  . Lactose intolerance in adult 2017  . Migraines    "weekly @ least" (04/26/2016)  . Seizures ([place])    one Dec. 2017 due to throat closing  . Type II diabetes mellitus ([employer])    "before the gastric bypass" (04/26/2016) states she no longer has diabetes    Patient Active Problem List   Diagnosis Date Noted  . 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 Symptomatic Anemia 04/25/2017  . Symptomatic anemia 04/25/2017  . Abnormal loss of weight   . Dysphagia   . S/P percutaneous endoscopic gastrostomy (PEG) tube placement ([place]) 09/06/2016  . At risk for adverse drug event 08/25/2016  . Malnutrition following gastrointestinal surgery 08/16/2016  . Anastomotic ulcer 07/21/2016  . Transient alteration of awareness   . Alcohol use 04/27/2016  . Chronic pain syndrome 04/27/2016  . Iron deficiency anemia 04/27/2016  . B12 deficiency 04/27/2016  . Gastrojejunal anastomotic stricture   . Hypoglycemia 04/22/2016  . Syncope 04/22/2016  . Protein-calorie malnutrition, severe 04/22/2016  . Nausea and vomiting   . Abdominal pain, chronic, epigastric   . Hypotension 02/01/2016  .  Hyponatremia 02/01/2016  . Subacromial impingement of left shoulder 07/06/2013  . Trochanteric bursitis of left hip 05/11/2013  . Arthritis 05/16/2012  . S/P gastric bypass 05/16/2012    Past Surgical History:  Procedure Laterality Date  . BALLOON DILATION N/A 05/27/2016   Procedure: BALLOON DILATION;  Surgeon: Doran Stabler, MD;  Location: [facility];  Service: Gastroenterology;  Laterality: N/A;  . BALLOON DILATION N/A 04/21/2017   Procedure: BALLOON DILATION;  Surgeon: Doran Stabler, MD;  Location: [facility];  Service: Gastroenterology;   Laterality: N/A;  . CERVICAL CONE BIOPSY    . [place]; 1996; 1998; 2014  . CESAREAN SECTION WITH BILATERAL TUBAL LIGATION Bilateral 10/28/2012   Procedure: Repeat cesarean section with delivery of baby boy at 74. Apgars 8/9.  BILATERAL TUBAL LIGATION;  Surgeon: Florian Buff, MD;  Location: [facility];  Service: Obstetrics;  Laterality: Bilateral;  . DILATION AND CURETTAGE OF UTERUS    . ESOPHAGOGASTRODUODENOSCOPY N/A 05/27/2016   Procedure: ESOPHAGOGASTRODUODENOSCOPY (EGD);  Surgeon: Doran Stabler, MD;  Location: [facility];  Service: Gastroenterology;  Laterality: N/A;  . ESOPHAGOGASTRODUODENOSCOPY (EGD) WITH PROPOFOL N/A 04/23/2016   Procedure: ESOPHAGOGASTRODUODENOSCOPY (EGD) WITH PROPOFOL;  Surgeon: Doran Stabler, MD;  Location: [facility];  Service: Endoscopy;  Laterality: N/A;  . ESOPHAGOGASTRODUODENOSCOPY (EGD) WITH PROPOFOL N/A 01/03/2017   Procedure: ESOPHAGOGASTRODUODENOSCOPY (EGD) WITH PROPOFOL;  Surgeon: Doran Stabler, MD;  Location: [facility];  Service: Gastroenterology;  Laterality: N/A;  . ESOPHAGOGASTRODUODENOSCOPY (EGD) WITH PROPOFOL N/A 04/21/2017   Procedure: ESOPHAGOGASTRODUODENOSCOPY (EGD) WITH PROPOFOL;  Surgeon: Doran Stabler, MD;  Location: [facility];  Service: Gastroenterology;  Laterality: N/A;  . GASTROSTOMY N/A 08/16/2016   Procedure: LAPAROSCOPIC INSERTION OF GASTROSTOMY TUBE IN REMNANT STOMACH;  Surgeon: Arta Bruce Kinsinger, MD;  Location: [facility];  Service: General;  Laterality: N/A;  . GASTROSTOMY N/A 05/16/2017   Procedure: Replacement of  GASTROSTOMY TUBE;  Surgeon: Kieth Brightly Arta Bruce, MD;  Location: [facility];  Service: General;  Laterality: N/A;  . LAPAROSCOPIC REVISION OF GASTROJEJUNOSTOMY Left 05/16/2017   Procedure: LAPAROSCOPIC REVISION OF GASTROJEJUNOSTOMY;  Surgeon: [name], MD;  Location: [facility];  Service: General;  Laterality: Left;  . ROUX-EN-Y GASTRIC BYPASS  2007  . TUBAL LIGATION  2014     OB  History    Gravida  6   Para  4   Term  4   Preterm  0   AB  2   Living  4     SAB  2   TAB      Ectopic      Multiple      Live Births  4            Home Medications    Prior to Admission medications   Medication Sig Start Date End Date Taking? Authorizing Provider  ALPRAZolam Duanne Moron) 1 MG tablet Take 1 tablet (1 mg total) by mouth at bedtime as needed for anxiety. Patient taking differently: Take 1 mg by mouth 3 (three) times daily.  08/27/16  Yes Lauree Chandler, NP  CARAFATE 1 GM/10ML suspension Take 10 mLs by mouth 3 (three) times daily before meals. 01/04/18  Yes [provider]  cyclobenzaprine (FLEXERIL) 10 MG tablet Take 10 mg by mouth 3 (three) times daily. 12/28/17  Yes [provider]  famotidine (PEPCID) 40 MG/5ML suspension Take 2.5 mLs by mouth 2 (two) times daily. 12/31/17  Yes [provider]  GAS RELIEF  20 MG/0.3ML drops Take 1 mL by mouth every 6 (six) hours as needed for flatulence.  11/25/17  Yes [provider]  lidocaine (XYLOCAINE) 5 % ointment Apply 1 application topically 3 (three) times daily. Apply to Bilateral shoulders 05/11/16  Yes [provider]  Multiple Vitamins-Minerals (MULTIVITAMIN WITH IRON-MINERALS) liquid Take 5 mLs by mouth daily.   Yes [provider]  OLANZapine (ZYPREXA) 2.5 MG tablet Take 2.5 mg by mouth daily. 12/25/17  Yes [provider]  ondansetron (ZOFRAN-ODT) 8 MG disintegrating tablet Take 8 mg by mouth every 8 (eight) hours as needed. 01/12/18  Yes [provider]  oxyCODONE (ROXICODONE) 15 MG immediate release tablet Take 15 mg by mouth every 6 (six) hours. 01/12/18  Yes [provider]  oxymetazoline (AFRIN) 0.05 % nasal spray Place 2 sprays into both nostrils 2 (two) times daily as needed for congestion.   Yes [provider]  pantoprazole (PROTONIX) 40 MG tablet Take 1 tablet (40 mg total) by mouth at bedtime. 05/24/17  Yes Kinsinger,  Arta Bruce, MD  QUEtiapine (SEROQUEL) 100 MG tablet Take 1 tablet (100 mg total) by mouth at bedtime. 07/25/16 01/18/18 Yes Emokpae, Courage, MD  cyclobenzaprine (FLEXERIL) 5 MG tablet Take 1 tablet (5 mg total) by mouth 3 (three) times daily as needed for muscle spasms. Patient not taking: Reported on 01/18/2018 08/20/16   Kinsinger, Arta Bruce, MD  erythromycin ophthalmic ointment Place a 1/2 inch ribbon of ointment into the lower eyelid 4 times daily for 7 days Patient not taking: Reported on 01/18/2018 09/24/17   Rodell Perna A, PA-C  Nutritional Supplements (FEEDING SUPPLEMENT, JEVITY 1.2 CAL,) LIQD Place 1,000 mLs into feeding tube daily. Put tube feeds via G tube from 6pm to 7am at a rate of 12ml/h Patient not taking: Reported on 01/18/2018 05/24/17   Kinsinger, Arta Bruce, MD  oxyCODONE (ROXICODONE) 5 MG/5ML solution Take 15 mLs (15 mg total) by mouth every 6 (six) hours as needed for moderate pain. Patient not taking: Reported on 01/18/2018 05/24/17   Kinsinger, Arta Bruce, MD  predniSONE (STERAPRED UNI-PAK 21 TAB) 10 MG (21) TBPK tablet Take by mouth daily. Take 6 tabs by mouth daily  for 2 days, then 5 tabs for 2 days, then 4 tabs for 2 days, then 3 tabs for 2 days, 2 tabs for 2 days, then 1 tab by mouth daily for 2 days 01/18/18   Renita Papa, PA-C    Family History Family History  Problem Relation Age of Onset  . Diabetes Father   . Heart disease Father   . Depression Maternal Grandmother   . Heart disease Maternal Grandfather   . Depression Paternal Grandmother   . Colon cancer Paternal Grandfather   . Pancreatic cancer Paternal Grandfather     Social History Social History   Tobacco Use  . Smoking status: Former Smoker    Packs/day: 0.50    Years: 4.00    Pack years: 2.00    Types: Cigarettes    Last attempt to quit: 03/16/2012    Years since quitting: 5.8  . Smokeless tobacco: Never Used  Substance Use Topics  . Alcohol use: Yes    Alcohol/week: 17.0 standard drinks     Types: 6 Glasses of wine, 11 Shots of liquor per week    Comment: alcohol tx none x 1 year  . Drug use: No     Allergies   Iron   Review of Systems Review of Systems  Constitutional: Negative for  chills and fever.  Musculoskeletal: Positive for arthralgias.  Neurological: Positive for numbness.     Physical Exam Updated Vital Signs BP (!) 133/101 (BP Location: Right Arm)   Pulse 87   Temp 98.2 F (36.8 C) (Oral)   Resp 16   LMP 12/18/2017   SpO2 100%   Physical Exam  Constitutional: She appears well-developed and well-nourished. No distress.  HENT:  Head: Normocephalic and atraumatic.  Eyes: Conjunctivae are normal. Right eye exhibits no discharge. Left eye exhibits no discharge.  Neck: No JVD present. No tracheal deviation present.  Cardiovascular: Normal rate and intact distal pulses.  2+ radial pulses bilaterally  Pulmonary/Chest: Effort normal.  Abdominal: She exhibits no distension.  Musculoskeletal: She exhibits tenderness. She exhibits no edema.       Left shoulder: She exhibits decreased range of motion, tenderness and pain. She exhibits no crepitus, normal pulse and normal strength.  Tenderness to palpation of the left shoulder superiorly and along the acromioclavicular joint.  Mild ecchymosis noted to the anterior aspect of the left shoulder.  Decreased active range of motion secondary to pain, full passive range of motion.  Positive empty can sign, positive Hawkins impingement test.  No crepitus or deformity.  5/5 strength of BUE major muscle groups  Neurological: She is alert.  Fluent speech, no facial droop, slightly altered sensation to soft touch of the lateral aspect of the left upper extremity.  Good grip strength bilaterally.  Skin: Skin is warm and dry. Capillary refill takes less than 2 seconds. No erythema.  Psychiatric: She has a normal mood and affect. Her behavior is normal.  Nursing note and vitals reviewed.    ED Treatments / Results   Labs (all labs ordered are listed, but only abnormal results are displayed) Labs Reviewed - No data to display  EKG None  Radiology Dg Shoulder Left  Result Date: 01/18/2018 CLINICAL DATA:  Shoulder pain for 4 days after fall walking to doctor's office. Initial encounter. EXAM: LEFT SHOULDER - 2+ VIEW COMPARISON:  08/06/2017 FINDINGS: There is no evidence of fracture or dislocation. Glenohumeral osteoarthritis with joint narrowing and spurring. Negative acromioclavicular joint. IMPRESSION: No acute finding. Glenohumeral osteoarthritis. Electronically Signed   By: Monte Fantasia M.D.   On: 01/18/2018 11:47    Procedures Procedures (including critical care time)  Medications Ordered in ED Medications - No data to display   Initial Impression / Assessment and Plan / ED Course  I have reviewed the triage vital signs and the nursing notes.  Pertinent labs & imaging results that were available during my care of the patient were reviewed by me and considered in my medical decision making (see chart for details).     Patient with shoulder pain status post mechanical fall.  She is afebrile, vital signs are stable.  She is nontoxic in appearance.  Neurovascularly intact.  Compartments are soft.  No evidence of DVT.  Patient X-Ray negative for obvious fracture or dislocation.  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  Shoulder sling given for comfort, advised of gentle stretching exercises to avoid muscle stiffness.  Will discharge with steroid taper.  Conservative therapy recommended and discussed.  Recommend follow-up with orthopedist.  Discussed strict ED return precautions. Pt verbalized understanding of and agreement with plan and is safe for discharge home at this time.   Final Clinical Impressions(s) / ED Diagnoses   Final diagnoses:  Acute  pain of left shoulder    ED Discharge Orders          Ordered    predniSONE (STERAPRED UNI-PAK 21 TAB) 10 MG (21) TBPK tablet  Daily     01/18/18 1300           Renita Papa, PA-C 01/18/18 1304    Gareth Morgan, MD 01/18/18 [phone]

## 2018-05-05 ENCOUNTER — Encounter (HOSPITAL_COMMUNITY): Payer: Self-pay | Admitting: *Deleted

## 2018-05-05 ENCOUNTER — Ambulatory Visit (HOSPITAL_COMMUNITY)
Admission: EM | Admit: 2018-05-05 | Discharge: 2018-05-05 | Disposition: A | Discharge status: Home / Self Care | Payer: Medicare Other | Source: Home / Self Care

## 2018-05-05 ENCOUNTER — Emergency Department (HOSPITAL_COMMUNITY)
Admission: EM | Admit: 2018-05-05 | Discharge: 2018-05-05 | Disposition: A | Discharge status: Home / Self Care | Payer: Medicare Other | Attending: Emergency Medicine | Admitting: Emergency Medicine

## 2018-05-05 ENCOUNTER — Other Ambulatory Visit: Payer: Self-pay

## 2018-05-05 DIAGNOSIS — Z5321 Procedure and treatment not carried out due to patient leaving prior to being seen by health care provider: Secondary | ICD-10-CM | POA: Insufficient documentation

## 2018-05-05 DIAGNOSIS — R109 Unspecified abdominal pain: Secondary | ICD-10-CM | POA: Insufficient documentation

## 2018-05-05 NOTE — ED Notes (Signed)
Pt states she had a Gtube recently removed and at the site she feels like something is tearing and swelling in her abdomen. Pt given information for capabilities here, pt decided to go to the ER.

## 2018-05-05 NOTE — ED Triage Notes (Signed)
Pt was getting up from her chair, felt a popping sensation to LLQ. Pt has had several abd surgeries, including G-tube placements. Pt currently does not have a gtube. Having increased pain to LLQ at site where gtube used to be. Reports swelling to area as well when standing with increased pain

## 2018-05-05 NOTE — ED Notes (Signed)
Pt visualized to no longer be here in the ED.

## 2019-03-26 ENCOUNTER — Other Ambulatory Visit: Payer: Self-pay | Admitting: General Surgery

## 2019-03-26 DIAGNOSIS — E669 Obesity, unspecified: Secondary | ICD-10-CM

## 2019-04-29 ENCOUNTER — Encounter (HOSPITAL_COMMUNITY): Payer: Self-pay | Admitting: Emergency Medicine

## 2019-04-29 ENCOUNTER — Emergency Department (HOSPITAL_COMMUNITY): Payer: Medicare Other

## 2019-04-29 ENCOUNTER — Inpatient Hospital Stay (HOSPITAL_COMMUNITY)
Admission: EM | Admit: 2019-04-29 | Discharge: 2019-05-11 | DRG: 643 | Disposition: A | Discharge status: Home Health | Payer: Medicare Other | Attending: Surgery | Admitting: Surgery

## 2019-04-29 DIAGNOSIS — Y92009 Unspecified place in unspecified non-institutional (private) residence as the place of occurrence of the external cause: Secondary | ICD-10-CM

## 2019-04-29 DIAGNOSIS — E46 Unspecified protein-calorie malnutrition: Secondary | ICD-10-CM

## 2019-04-29 DIAGNOSIS — E876 Hypokalemia: Secondary | ICD-10-CM | POA: Diagnosis not present

## 2019-04-29 DIAGNOSIS — Z87891 Personal history of nicotine dependence: Secondary | ICD-10-CM

## 2019-04-29 DIAGNOSIS — I129 Hypertensive chronic kidney disease with stage 1 through stage 4 chronic kidney disease, or unspecified chronic kidney disease: Secondary | ICD-10-CM | POA: Diagnosis present

## 2019-04-29 DIAGNOSIS — K224 Dyskinesia of esophagus: Secondary | ICD-10-CM | POA: Diagnosis present

## 2019-04-29 DIAGNOSIS — E872 Acidosis: Secondary | ICD-10-CM | POA: Diagnosis present

## 2019-04-29 DIAGNOSIS — N179 Acute kidney failure, unspecified: Secondary | ICD-10-CM | POA: Diagnosis present

## 2019-04-29 DIAGNOSIS — N1330 Unspecified hydronephrosis: Secondary | ICD-10-CM | POA: Diagnosis present

## 2019-04-29 DIAGNOSIS — R112 Nausea with vomiting, unspecified: Secondary | ICD-10-CM

## 2019-04-29 DIAGNOSIS — K9423 Gastrostomy malfunction: Secondary | ICD-10-CM | POA: Diagnosis not present

## 2019-04-29 DIAGNOSIS — D61818 Other pancytopenia: Secondary | ICD-10-CM | POA: Diagnosis present

## 2019-04-29 DIAGNOSIS — E162 Hypoglycemia, unspecified: Secondary | ICD-10-CM | POA: Diagnosis present

## 2019-04-29 DIAGNOSIS — F419 Anxiety disorder, unspecified: Secondary | ICD-10-CM | POA: Diagnosis present

## 2019-04-29 DIAGNOSIS — Z6824 Body mass index (BMI) 24.0-24.9, adult: Secondary | ICD-10-CM

## 2019-04-29 DIAGNOSIS — R188 Other ascites: Secondary | ICD-10-CM | POA: Diagnosis present

## 2019-04-29 DIAGNOSIS — R911 Solitary pulmonary nodule: Secondary | ICD-10-CM | POA: Diagnosis present

## 2019-04-29 DIAGNOSIS — E43 Unspecified severe protein-calorie malnutrition: Secondary | ICD-10-CM | POA: Diagnosis present

## 2019-04-29 DIAGNOSIS — Z800 Family history of malignant neoplasm of digestive organs: Secondary | ICD-10-CM

## 2019-04-29 DIAGNOSIS — K9171 Accidental puncture and laceration of a digestive system organ or structure during a digestive system procedure: Secondary | ICD-10-CM | POA: Diagnosis not present

## 2019-04-29 DIAGNOSIS — R64 Cachexia: Secondary | ICD-10-CM | POA: Diagnosis present

## 2019-04-29 DIAGNOSIS — E538 Deficiency of other specified B group vitamins: Secondary | ICD-10-CM | POA: Diagnosis present

## 2019-04-29 DIAGNOSIS — D509 Iron deficiency anemia, unspecified: Secondary | ICD-10-CM | POA: Diagnosis present

## 2019-04-29 DIAGNOSIS — F319 Bipolar disorder, unspecified: Secondary | ICD-10-CM | POA: Diagnosis present

## 2019-04-29 DIAGNOSIS — K9189 Other postprocedural complications and disorders of digestive system: Secondary | ICD-10-CM

## 2019-04-29 DIAGNOSIS — R680 Hypothermia, not associated with low environmental temperature: Secondary | ICD-10-CM | POA: Diagnosis present

## 2019-04-29 DIAGNOSIS — J90 Pleural effusion, not elsewhere classified: Secondary | ICD-10-CM | POA: Diagnosis present

## 2019-04-29 DIAGNOSIS — R627 Adult failure to thrive: Secondary | ICD-10-CM

## 2019-04-29 DIAGNOSIS — Z888 Allergy status to other drugs, medicaments and biological substances status: Secondary | ICD-10-CM

## 2019-04-29 DIAGNOSIS — M7989 Other specified soft tissue disorders: Secondary | ICD-10-CM | POA: Diagnosis present

## 2019-04-29 DIAGNOSIS — T68XXXA Hypothermia, initial encounter: Secondary | ICD-10-CM | POA: Diagnosis not present

## 2019-04-29 DIAGNOSIS — E739 Lactose intolerance, unspecified: Secondary | ICD-10-CM | POA: Diagnosis present

## 2019-04-29 DIAGNOSIS — Z7952 Long term (current) use of systemic steroids: Secondary | ICD-10-CM

## 2019-04-29 DIAGNOSIS — K828 Other specified diseases of gallbladder: Secondary | ICD-10-CM | POA: Diagnosis present

## 2019-04-29 DIAGNOSIS — Z789 Other specified health status: Secondary | ICD-10-CM

## 2019-04-29 DIAGNOSIS — E871 Hypo-osmolality and hyponatremia: Secondary | ICD-10-CM | POA: Diagnosis present

## 2019-04-29 DIAGNOSIS — Z79899 Other long term (current) drug therapy: Secondary | ICD-10-CM

## 2019-04-29 DIAGNOSIS — D539 Nutritional anemia, unspecified: Secondary | ICD-10-CM | POA: Diagnosis present

## 2019-04-29 DIAGNOSIS — E161 Other hypoglycemia: Principal | ICD-10-CM | POA: Diagnosis present

## 2019-04-29 DIAGNOSIS — Z833 Family history of diabetes mellitus: Secondary | ICD-10-CM

## 2019-04-29 DIAGNOSIS — N183 Chronic kidney disease, stage 3 unspecified: Secondary | ICD-10-CM | POA: Diagnosis present

## 2019-04-29 DIAGNOSIS — K219 Gastro-esophageal reflux disease without esophagitis: Secondary | ICD-10-CM | POA: Diagnosis present

## 2019-04-29 DIAGNOSIS — Z818 Family history of other mental and behavioral disorders: Secondary | ICD-10-CM

## 2019-04-29 DIAGNOSIS — Z8249 Family history of ischemic heart disease and other diseases of the circulatory system: Secondary | ICD-10-CM

## 2019-04-29 DIAGNOSIS — G629 Polyneuropathy, unspecified: Secondary | ICD-10-CM | POA: Diagnosis present

## 2019-04-29 DIAGNOSIS — K831 Obstruction of bile duct: Secondary | ICD-10-CM

## 2019-04-29 DIAGNOSIS — G894 Chronic pain syndrome: Secondary | ICD-10-CM | POA: Diagnosis present

## 2019-04-29 DIAGNOSIS — Z20822 Contact with and (suspected) exposure to covid-19: Secondary | ICD-10-CM | POA: Diagnosis present

## 2019-04-29 DIAGNOSIS — R634 Abnormal weight loss: Secondary | ICD-10-CM

## 2019-04-29 DIAGNOSIS — Z79891 Long term (current) use of opiate analgesic: Secondary | ICD-10-CM

## 2019-04-29 DIAGNOSIS — M797 Fibromyalgia: Secondary | ICD-10-CM | POA: Diagnosis present

## 2019-04-29 DIAGNOSIS — Z9884 Bariatric surgery status: Secondary | ICD-10-CM

## 2019-04-29 DIAGNOSIS — R601 Generalized edema: Secondary | ICD-10-CM | POA: Diagnosis present

## 2019-04-29 DIAGNOSIS — K760 Fatty (change of) liver, not elsewhere classified: Secondary | ICD-10-CM | POA: Diagnosis present

## 2019-04-29 DIAGNOSIS — W010XXA Fall on same level from slipping, tripping and stumbling without subsequent striking against object, initial encounter: Secondary | ICD-10-CM | POA: Diagnosis present

## 2019-04-29 LAB — I-STAT BETA HCG BLOOD, ED (MC, WL, AP ONLY): I-stat hCG, quantitative: <5 m[IU]/mL (ref <5)

## 2019-04-29 LAB — POCT I-STAT EG7
Acid-base deficit: 11 mmol/L — ABNORMAL HIGH (ref 0.0–2.0)
Bicarbonate: 15.1 mmol/L — ABNORMAL LOW (ref 20.0–28.0)
Calcium, Ion: 1.16 mmol/L (ref 1.15–1.40)
HCT: 26 % — ABNORMAL LOW (ref 36.0–46.0)
Hemoglobin: 8.8 g/dL — ABNORMAL LOW (ref 12.0–15.0)
O2 Saturation: 83 %
Potassium: 3.6 mmol/L (ref 3.5–5.1)
Sodium: 142 mmol/L (ref 135–145)
TCO2: 16 mmol/L — ABNORMAL LOW (ref 22–32)
pCO2, Ven: 31.7 mmHg — ABNORMAL LOW (ref 44.0–60.0)
pH, Ven: 7.285 (ref 7.250–7.430)
pO2, Ven: 52 mmHg — ABNORMAL HIGH (ref 32.0–45.0)

## 2019-04-29 LAB — FERRITIN: Ferritin: 320 ng/mL — ABNORMAL HIGH (ref 11–307)

## 2019-04-29 LAB — DIFFERENTIAL
Abs Immature Granulocytes: 0.01 10*3/uL (ref 0.00–0.07)
Basophils Absolute: 0 10*3/uL (ref 0.0–0.1)
Basophils Relative: 1 %
Eosinophils Absolute: 0 10*3/uL (ref 0.0–0.5)
Eosinophils Relative: 0 %
Immature Granulocytes: 1 %
Lymphocytes Relative: 22 %
Lymphs Abs: 0.4 10*3/uL — ABNORMAL LOW (ref 0.7–4.0)
Monocytes Absolute: 0.3 10*3/uL (ref 0.1–1.0)
Monocytes Relative: 13 %
Neutro Abs: 1.3 10*3/uL — ABNORMAL LOW (ref 1.7–7.7)
Neutrophils Relative %: 63 %

## 2019-04-29 LAB — BASIC METABOLIC PANEL
Anion gap: 12 (ref 5–15)
BUN: 13 mg/dL (ref 6–20)
CO2: 10 mmol/L — ABNORMAL LOW (ref 22–32)
Calcium: 7.7 mg/dL — ABNORMAL LOW (ref 8.9–10.3)
Chloride: 119 mmol/L — ABNORMAL HIGH (ref 98–111)
Creatinine, Ser: 1.55 mg/dL — ABNORMAL HIGH (ref 0.44–1.00)
GFR calc Af Amer: 44 mL/min — ABNORMAL LOW (ref >60)
GFR calc non Af Amer: 38 mL/min — ABNORMAL LOW (ref >60)
Glucose, Bld: 100 mg/dL — ABNORMAL HIGH (ref 70–99)
Potassium: 4.2 mmol/L (ref 3.5–5.1)
Sodium: 141 mmol/L (ref 135–145)

## 2019-04-29 LAB — TRIGLYCERIDES: Triglycerides: 104 mg/dL (ref <150)

## 2019-04-29 LAB — ETHANOL: Alcohol, Ethyl (B): <10 mg/dL (ref <10)

## 2019-04-29 LAB — CBG MONITORING, ED
Glucose-Capillary: 119 mg/dL — ABNORMAL HIGH (ref 70–99)
Glucose-Capillary: 188 mg/dL — ABNORMAL HIGH (ref 70–99)
Glucose-Capillary: 31 mg/dL — CL (ref 70–99)
Glucose-Capillary: 213 mg/dL — ABNORMAL HIGH (ref 70–99)
Glucose-Capillary: 241 mg/dL — ABNORMAL HIGH (ref 70–99)

## 2019-04-29 LAB — LACTIC ACID, PLASMA: Lactic Acid, Venous: 1 mmol/L (ref 0.5–1.9)

## 2019-04-29 LAB — CBC
HCT: 24.6 % — ABNORMAL LOW (ref 36.0–46.0)
HCT: 24.7 % — ABNORMAL LOW (ref 36.0–46.0)
Hemoglobin: 7.4 g/dL — ABNORMAL LOW (ref 12.0–15.0)
Hemoglobin: 7.4 g/dL — ABNORMAL LOW (ref 12.0–15.0)
MCH: 34.9 pg — ABNORMAL HIGH (ref 26.0–34.0)
MCH: 34.7 pg — ABNORMAL HIGH (ref 26.0–34.0)
MCHC: 30.1 g/dL (ref 30.0–36.0)
MCHC: 30 g/dL (ref 30.0–36.0)
MCV: 116 fL — ABNORMAL HIGH (ref 80.0–100.0)
MCV: 116 fL — ABNORMAL HIGH (ref 80.0–100.0)
Platelets: 175 10*3/uL (ref 150–400)
Platelets: 186 10*3/uL (ref 150–400)
RBC: 2.12 MIL/uL — ABNORMAL LOW (ref 3.87–5.11)
RBC: 2.13 MIL/uL — ABNORMAL LOW (ref 3.87–5.11)
RDW: 19.3 % — ABNORMAL HIGH (ref 11.5–15.5)
RDW: 19.4 % — ABNORMAL HIGH (ref 11.5–15.5)
WBC: 2 10*3/uL — ABNORMAL LOW (ref 4.0–10.5)
WBC: 2 10*3/uL — ABNORMAL LOW (ref 4.0–10.5)
nRBC: 0 % (ref 0.0–0.2)
nRBC: 1.5 % — ABNORMAL HIGH (ref 0.0–0.2)

## 2019-04-29 LAB — RAPID URINE DRUG SCREEN, HOSP PERFORMED
Amphetamines: NOT DETECTED
Barbiturates: NOT DETECTED
Benzodiazepines: POSITIVE — AB
Cocaine: NOT DETECTED
Opiates: POSITIVE — AB
Tetrahydrocannabinol: NOT DETECTED

## 2019-04-29 LAB — URINALYSIS, ROUTINE W REFLEX MICROSCOPIC
Bilirubin Urine: NEGATIVE
Glucose, UA: NEGATIVE mg/dL
Hgb urine dipstick: NEGATIVE
Ketones, ur: NEGATIVE mg/dL
Leukocytes,Ua: NEGATIVE
Nitrite: NEGATIVE
Protein, ur: NEGATIVE mg/dL
Specific Gravity, Urine: 1.01 (ref 1.005–1.030)
pH: 5 (ref 5.0–8.0)

## 2019-04-29 LAB — FIBRINOGEN: Fibrinogen: 86 mg/dL — CL (ref 210–475)

## 2019-04-29 LAB — AMMONIA: Ammonia: 38 umol/L — ABNORMAL HIGH (ref 9–35)

## 2019-04-29 LAB — HEPATIC FUNCTION PANEL
ALT: 32 U/L (ref 0–44)
AST: 53 U/L — ABNORMAL HIGH (ref 15–41)
Albumin: 2.4 g/dL — ABNORMAL LOW (ref 3.5–5.0)
Alkaline Phosphatase: 213 U/L — ABNORMAL HIGH (ref 38–126)
Bilirubin, Direct: 0.6 mg/dL — ABNORMAL HIGH (ref 0.0–0.2)
Indirect Bilirubin: 0.4 mg/dL (ref 0.3–0.9)
Total Bilirubin: 1 mg/dL (ref 0.3–1.2)
Total Protein: 5.8 g/dL — ABNORMAL LOW (ref 6.5–8.1)

## 2019-04-29 LAB — PROCALCITONIN: Procalcitonin: 7.56 ng/mL

## 2019-04-29 LAB — TROPONIN I (HIGH SENSITIVITY)
Troponin I (High Sensitivity): 44 ng/L — ABNORMAL HIGH (ref <18)
Troponin I (High Sensitivity): 54 ng/L — ABNORMAL HIGH (ref <18)

## 2019-04-29 LAB — LACTATE DEHYDROGENASE: LDH: 333 U/L — ABNORMAL HIGH (ref 98–192)

## 2019-04-29 LAB — D-DIMER, QUANTITATIVE: D-Dimer, Quant: 2.04 ug/mL-FEU — ABNORMAL HIGH (ref 0.00–0.50)

## 2019-04-29 LAB — POC OCCULT BLOOD, ED: Fecal Occult Bld: NEGATIVE

## 2019-04-29 LAB — PHOSPHORUS: Phosphorus: 4.3 mg/dL (ref 2.5–4.6)

## 2019-04-29 LAB — CORTISOL: Cortisol, Plasma: 25.7 ug/dL

## 2019-04-29 LAB — RAPID HIV SCREEN (HIV 1/2 AB+AG)
HIV 1/2 Antibodies: NONREACTIVE
HIV-1 P24 Antigen - HIV24: NONREACTIVE

## 2019-04-29 LAB — POC SARS CORONAVIRUS 2 AG -  ED: SARS Coronavirus 2 Ag: NEGATIVE

## 2019-04-29 LAB — MAGNESIUM: Magnesium: 1.8 mg/dL (ref 1.7–2.4)

## 2019-04-29 LAB — C-REACTIVE PROTEIN: CRP: 2.7 mg/dL — ABNORMAL HIGH (ref <1.0)

## 2019-04-29 LAB — LIPASE, BLOOD: Lipase: 13 U/L (ref 11–51)

## 2019-04-29 LAB — TSH: TSH: 1.623 u[IU]/mL (ref 0.350–4.500)

## 2019-04-29 MED ORDER — ONDANSETRON HCL 4 MG PO TABS: 4.0000 mg | ORAL_TABLET | Freq: Four times a day (QID) | ORAL | Status: DC | PRN

## 2019-04-29 MED ORDER — ENOXAPARIN SODIUM 40 MG/0.4ML ~~LOC~~ SOLN
40.0000 mg | Freq: Every day | SUBCUTANEOUS | Status: DC
  Administered 2019-04-30 – 2019-05-01 (×2): 40 mg via SUBCUTANEOUS
  Filled 2019-04-29 (×2): qty 0.4

## 2019-04-29 MED ORDER — ACETAMINOPHEN 325 MG PO TABS
650.0000 mg | ORAL_TABLET | Freq: Four times a day (QID) | ORAL | Status: DC | PRN
  Administered 2019-05-08 – 2019-05-10 (×3): 650 mg via ORAL
  Filled 2019-04-29 (×3): qty 2

## 2019-04-29 MED ORDER — DEXTROSE 50 % IV SOLN
1.0000 | Freq: Once | INTRAVENOUS | Status: AC
  Administered 2019-04-29: 50 mL via INTRAVENOUS

## 2019-04-29 MED ORDER — ALPRAZOLAM 0.5 MG PO TABS
1.0000 mg | ORAL_TABLET | Freq: Three times a day (TID) | ORAL | Status: DC
  Administered 2019-04-29 – 2019-04-30 (×3): 1 mg via ORAL
  Filled 2019-04-29 (×2): qty 4
  Filled 2019-04-29: qty 2

## 2019-04-29 MED ORDER — ONDANSETRON HCL 4 MG/2ML IJ SOLN: 4.0000 mg | Freq: Four times a day (QID) | INTRAMUSCULAR | Status: DC | PRN

## 2019-04-29 MED ORDER — ACETAMINOPHEN 650 MG RE SUPP: 650.0000 mg | Freq: Four times a day (QID) | RECTAL | Status: DC | PRN

## 2019-04-29 MED ORDER — OXYCODONE HCL 5 MG PO TABS
15.0000 mg | ORAL_TABLET | Freq: Four times a day (QID) | ORAL | Status: DC
  Administered 2019-04-29 – 2019-04-30 (×4): 15 mg via ORAL
  Filled 2019-04-29 (×4): qty 3

## 2019-04-29 MED ORDER — DEXTROSE 50 % IV SOLN
INTRAVENOUS | Status: AC
  Filled 2019-04-29: qty 50

## 2019-04-29 MED ORDER — PANTOPRAZOLE SODIUM 40 MG PO TBEC
40.0000 mg | DELAYED_RELEASE_TABLET | Freq: Every day | ORAL | Status: DC
  Administered 2019-04-29 – 2019-05-10 (×12): 40 mg via ORAL
  Filled 2019-04-29 (×12): qty 1

## 2019-04-29 MED ORDER — DEXTROSE 10 % IV SOLN: INTRAVENOUS | Status: DC

## 2019-04-29 MED ORDER — OLANZAPINE 2.5 MG PO TABS
2.5000 mg | ORAL_TABLET | Freq: Every day | ORAL | Status: DC
  Administered 2019-04-30 – 2019-05-11 (×11): 2.5 mg via ORAL
  Filled 2019-04-29 (×12): qty 1

## 2019-04-29 MED ORDER — VANCOMYCIN HCL IN DEXTROSE 1-5 GM/200ML-% IV SOLN
1000.0000 mg | Freq: Once | INTRAVENOUS | Status: AC
  Administered 2019-04-30: 1000 mg via INTRAVENOUS
  Filled 2019-04-29: qty 200

## 2019-04-29 MED ORDER — SODIUM CHLORIDE 0.9 % IV BOLUS
1000.0000 mL | Freq: Once | INTRAVENOUS | Status: AC
  Administered 2019-04-29: 1000 mL via INTRAVENOUS

## 2019-04-29 MED ORDER — SODIUM CHLORIDE 0.9 % IV SOLN
2.0000 g | Freq: Once | INTRAVENOUS | Status: AC
  Administered 2019-04-29: 2 g via INTRAVENOUS
  Filled 2019-04-29: qty 2

## 2019-04-29 MED ORDER — SODIUM CHLORIDE 0.9% FLUSH
3.0000 mL | Freq: Once | INTRAVENOUS | Status: AC
  Administered 2019-04-29: 3 mL via INTRAVENOUS

## 2019-04-29 MED ORDER — QUETIAPINE FUMARATE 25 MG PO TABS
100.0000 mg | ORAL_TABLET | Freq: Every day | ORAL | Status: DC
  Administered 2019-04-30: 100 mg via ORAL
  Filled 2019-04-29: qty 4
  Filled 2019-04-29 (×2): qty 1

## 2019-04-29 NOTE — ED Notes (Signed)
Continuous IVF replaced w/ D10

## 2019-04-29 NOTE — ED Notes (Signed)
Patient returned from CT; VSS; continued Bair hugger on medium; patient c/o generalized aches and states she takes Oxy 15 mg every 6 hours and is asking for a dose. Will notifiy ER provider. Patient is in bed, NAD, call light in reach will continue to monitor.

## 2019-04-29 NOTE — ED Notes (Signed)
Called [phone] for patient's CT abd ordered @ [id number]; Maudie Mercury stated approx. maybe 30 mins

## 2019-04-29 NOTE — H&P (Signed)
History and Physical    Angie [name] [id number] DOB: [birth year] DOA: 04/29/2019  PCP: Harvie Junior, MD  Patient coming from: Home.  Chief Complaint: Unresponsive episode.  HPI: Angie [name] is a 54 y.o. female with history of gastric bypass surgery prior to which patient had a history of diabetes mellitus, chronic kidney disease stage III, chronic anemia chronic malnutrition with history of esophageal strictures, bipolar disorder and chronic pain was found to be unresponsive by patient's boyfriend when he returned back from work after 12 hours.  Patient was lying on the recliner and was found to be in the same state.  EMS was called and patient blood sugar was found to be 23.  Was given glucagon and D50 and was brought to the ER.  Patient states to me that she has been feeling increasingly weak over the last 3 weeks with increasing peripheral edema of both lower extremity and difficulty ambulate with patient planning to see a neurologist because of the difficulty ambulating at no incontinence of urine or bowel or any decrease sensation of the extremities.  Patient did have a fall about a week ago when she tripped on a vanity in which fell on her.  ED Course: In the ER patient became more alert awake but was hypothermic with temperature of 93 F.  Patient became again hypoglycemic and had to be started on D10.  Blood cultures procalcitonin levels cortisol level TSH was sent.  Patient had CT head and C-spine followed by CT chest and abdomen.  CT head and C-spine were unremarkable.  CT chest and abdomen was showing third spacing of fluid and hepatic steatosis.  Pulmonary nodule also seen.  Covid test negative.  Labs show creatinine 1.5 albumin of 2.4 hemoglobin 7.4 which further decreased to 6.4 WBC was 2.  Patient was started on D10 and admitted for further management of hypothermia hypoglycemia with elevated procalcitonin concerning for infection for which antibiotics were  started.  Review of Systems: As per HPI, rest all negative.   Past Medical History:  Diagnosis Date  . Abnormal Pap smear   . Alcohol abuse   . Anemia    IDA  . Anxiety    Takes xanax  . Arthritis    left hip/knees, lower spine  . Bipolar disorder ([place])   . Blood transfusion without reported diagnosis last done 04-25-17  . Bulging lumbar disc   . Bursitis    left shoulder  . Chronic lower back pain   . Cut    right middle finger with knife small cut healing pt instructed to keep clean and dry no redness or drainage  . Depression   . Diverticulitis   . Fibromyalgia   . GERD (gastroesophageal reflux disease)   . Hypertension    none since weight loss  . Lactose intolerance in adult 2017  . Migraines    "weekly @ least" (04/26/2016)  . Seizures ([place])    one Dec. 2017 due to throat closing  . Type II diabetes mellitus ([place])    "before the gastric bypass" (04/26/2016) states she no longer has diabetes    Past Surgical History:  Procedure Laterality Date  . BALLOON DILATION N/A 05/27/2016   Procedure: BALLOON DILATION;  Surgeon: Doran Stabler, MD;  Location: [facility];  Service: Gastroenterology;  Laterality: N/A;  . BALLOON DILATION N/A 04/21/2017   Procedure: BALLOON DILATION;  Surgeon: Doran Stabler, MD;  Location: [place];  Service: Gastroenterology;  Laterality: N/A;  .  CERVICAL CONE BIOPSY    . [place]; 1996; 1998; 2014  . CESAREAN SECTION WITH BILATERAL TUBAL LIGATION Bilateral 10/28/2012   Procedure: Repeat cesarean section with delivery of baby boy at 82. Apgars 8/9.  BILATERAL TUBAL LIGATION;  Surgeon: Florian Buff, MD;  Location: [facility];  Service: Obstetrics;  Laterality: Bilateral;  . DILATION AND CURETTAGE OF UTERUS    . ESOPHAGOGASTRODUODENOSCOPY N/A 05/27/2016   Procedure: ESOPHAGOGASTRODUODENOSCOPY (EGD);  Surgeon: Doran Stabler, MD;  Location: [facility];  Service: Gastroenterology;  Laterality: N/A;  .  ESOPHAGOGASTRODUODENOSCOPY (EGD) WITH PROPOFOL N/A 04/23/2016   Procedure: ESOPHAGOGASTRODUODENOSCOPY (EGD) WITH PROPOFOL;  Surgeon: Doran Stabler, MD;  Location: [place];  Service: Endoscopy;  Laterality: N/A;  . ESOPHAGOGASTRODUODENOSCOPY (EGD) WITH PROPOFOL N/A 01/03/2017   Procedure: ESOPHAGOGASTRODUODENOSCOPY (EGD) WITH PROPOFOL;  Surgeon: Doran Stabler, MD;  Location: [facility];  Service: Gastroenterology;  Laterality: N/A;  . ESOPHAGOGASTRODUODENOSCOPY (EGD) WITH PROPOFOL N/A 04/21/2017   Procedure: ESOPHAGOGASTRODUODENOSCOPY (EGD) WITH PROPOFOL;  Surgeon: Doran Stabler, MD;  Location: [place];  Service: Gastroenterology;  Laterality: N/A;  . GASTROSTOMY N/A 08/16/2016   Procedure: LAPAROSCOPIC INSERTION OF GASTROSTOMY TUBE IN REMNANT STOMACH;  Surgeon: Arta Bruce Kinsinger, MD;  Location: [facility];  Service: General;  Laterality: N/A;  . GASTROSTOMY N/A 05/16/2017   Procedure: Replacement of  GASTROSTOMY TUBE;  Surgeon: Kieth Brightly Arta Bruce, MD;  Location: [facility];  Service: General;  Laterality: N/A;  . LAPAROSCOPIC REVISION OF GASTROJEJUNOSTOMY Left 05/16/2017   Procedure: LAPAROSCOPIC REVISION OF GASTROJEJUNOSTOMY;  Surgeon: [name], MD;  Location: [facility];  Service: General;  Laterality: Left;  . ROUX-EN-Y GASTRIC BYPASS  2007  . TUBAL LIGATION  2014     reports that she quit smoking about 7 years ago. Her smoking use included cigarettes. She has a 2.00 pack-year smoking history. She has never used smokeless tobacco. She reports current alcohol use of about 17.0 standard drinks of alcohol per week. She reports that she does not use drugs.  Allergies  Allergen Reactions  . Iron Anaphylaxis    Had acute allergy with tachycardia, attention and generalized itching shortly after receiving nulecit ( iron gluconate) infusion.    Family History  Problem Relation Age of Onset  . Diabetes Father   . Heart disease Father   . Depression Maternal  Grandmother   . Heart disease Maternal Grandfather   . Depression Paternal Grandmother   . Colon cancer Paternal Grandfather   . Pancreatic cancer Paternal Grandfather     Prior to Admission medications   Medication Sig Start Date End Date Taking? Authorizing Provider  oxyCODONE (ROXICODONE) 15 MG immediate release tablet Take 15 mg by mouth every 6 (six) hours. 01/12/18  Yes [provider]  ALPRAZolam Duanne Moron) 1 MG tablet Take 1 tablet (1 mg total) by mouth at bedtime as needed for anxiety. Patient taking differently: Take 1 mg by mouth 3 (three) times daily.  08/27/16   Lauree Chandler, NP  CARAFATE 1 GM/10ML suspension Take 10 mLs by mouth 3 (three) times daily before meals. 01/04/18   [provider]  cyclobenzaprine (FLEXERIL) 10 MG tablet Take 10 mg by mouth 3 (three) times daily. 12/28/17   [provider]  cyclobenzaprine (FLEXERIL) 5 MG tablet Take 1 tablet (5 mg total) by mouth 3 (three) times daily as needed for muscle spasms. Patient not taking: Reported on 01/18/2018 08/20/16   Kinsinger, Arta Bruce, MD  erythromycin ophthalmic ointment Place  a 1/2 inch ribbon of ointment into the lower eyelid 4 times daily for 7 days Patient not taking: Reported on 01/18/2018 09/24/17   Rodell Perna A, PA-C  famotidine (PEPCID) 40 MG/5ML suspension Take 2.5 mLs by mouth 2 (two) times daily. 12/31/17   [provider]  GAS RELIEF 20 MG/0.3ML drops Take 1 mL by mouth every 6 (six) hours as needed for flatulence.  11/25/17   [provider]  lidocaine (XYLOCAINE) 5 % ointment Apply 1 application topically 3 (three) times daily. Apply to Bilateral shoulders 05/11/16   [provider]  Multiple Vitamins-Minerals (MULTIVITAMIN WITH IRON-MINERALS) liquid Take 5 mLs by mouth daily.    [provider]  Nutritional Supplements (FEEDING SUPPLEMENT, JEVITY 1.2 CAL,) LIQD Place 1,000 mLs into feeding tube daily. Put tube feeds via G tube from 6pm to 7am at a  rate of 53ml/h Patient not taking: Reported on 01/18/2018 05/24/17   Kinsinger, Arta Bruce, MD  OLANZapine (ZYPREXA) 2.5 MG tablet Take 2.5 mg by mouth daily. 12/25/17   [provider]  ondansetron (ZOFRAN-ODT) 8 MG disintegrating tablet Take 8 mg by mouth every 8 (eight) hours as needed. 01/12/18   [provider]  oxyCODONE (ROXICODONE) 5 MG/5ML solution Take 15 mLs (15 mg total) by mouth every 6 (six) hours as needed for moderate pain. Patient not taking: Reported on 01/18/2018 05/24/17   Kinsinger, Arta Bruce, MD  oxymetazoline (AFRIN) 0.05 % nasal spray Place 2 sprays into both nostrils 2 (two) times daily as needed for congestion.    [provider]  pantoprazole (PROTONIX) 40 MG tablet Take 1 tablet (40 mg total) by mouth at bedtime. 05/24/17   Kinsinger, Arta Bruce, MD  predniSONE (STERAPRED UNI-PAK 21 TAB) 10 MG (21) TBPK tablet Take by mouth daily. Take 6 tabs by mouth daily  for 2 days, then 5 tabs for 2 days, then 4 tabs for 2 days, then 3 tabs for 2 days, 2 tabs for 2 days, then 1 tab by mouth daily for 2 days 01/18/18   Rodell Perna A, PA-C  QUEtiapine (SEROQUEL) 100 MG tablet Take 1 tablet (100 mg total) by mouth at bedtime. 07/25/16 01/18/18  Roxan Hockey, MD    Physical Exam: Constitutional: Moderately built and nourished. Vitals:   04/29/19 2115 04/29/19 2117 04/29/19 2130 04/29/19 2145  BP: (!) 145/97 (!) 141/104 (!) 152/95 (!) 153/101  Pulse: (!) 43  78 76  Resp: 19 12 10 15   Temp:  (!) 93.3 F (34.1 C)    TempSrc:  Rectal    SpO2: 97% 100% 100% 100%   Eyes: Anicteric no pallor. ENMT: No discharge from the ears eyes nose or mouth. Neck: No masses.  No neck rigidity. Respiratory: No rhonchi palpitations. Cardiovascular: S1-S2 heard. Abdomen: Soft nontender bowel sound present. Musculoskeletal: Bilateral lower extremity edema. Skin: No rash. Neurologic: Alert awake oriented time place and person.  Able to move all extremities but complains of lower  extremity weakness.  Has good deep tendon reflexes.  Good sensation of both lower extremity. Psychiatric: Appears normal.   Labs on Admission: I have personally reviewed following labs and imaging studies  CBC: Recent Labs  Lab 04/29/19 1546 04/29/19 1716  WBC 2.0*  2.0*  --   NEUTROABS 1.3*  --   HGB 7.4*  7.4* 8.8*  HCT 24.7*  24.6* 26.0*  MCV 116.0*  116.0*  --   PLT 186  175  --    Basic Metabolic Panel: Recent Labs  Lab  04/29/19 1546 04/29/19 1650 04/29/19 1716  NA 141  --  142  K 4.2  --  3.6  CL 119*  --   --   CO2 10*  --   --   GLUCOSE 100*  --   --   BUN 13  --   --   CREATININE 1.55*  --   --   CALCIUM 7.7*  --   --   MG  --  1.8  --   PHOS  --  4.3  --    GFR: CrCl cannot be calculated (Unknown ideal weight.). Liver Function Tests: Recent Labs  Lab 04/29/19 1650  AST 53*  ALT 32  ALKPHOS 213*  BILITOT 1.0  PROT 5.8*  ALBUMIN 2.4*   Recent Labs  Lab 04/29/19 1650  LIPASE 13   Recent Labs  Lab 04/29/19 1650  AMMONIA 38*   Coagulation Profile: No results for input(s): INR, PROTIME in the last 168 hours. Cardiac Enzymes: No results for input(s): CKTOTAL, CKMB, CKMBINDEX, TROPONINI in the last 168 hours. BNP (last 3 results) No results for input(s): PROBNP in the last 8760 hours. HbA1C: No results for input(s): HGBA1C in the last 72 hours. CBG: Recent Labs  Lab 04/29/19 1547 04/29/19 1809 04/29/19 1826 04/29/19 1925 04/29/19 2117  GLUCAP 119* 31* 188* 213* 241*   Lipid Profile: Recent Labs    04/29/19 1650  TRIG 104   Thyroid Function Tests: Recent Labs    04/29/19 1650  TSH 1.623   Anemia Panel: Recent Labs    04/29/19 1650  FERRITIN 320*   Urine analysis:    Component Value Date/Time   COLORURINE YELLOW 04/29/2019 [place] 04/29/2019 1715   LABSPEC 1.010 04/29/2019 1715   PHURINE 5.0 04/29/2019 1715   GLUCOSEU NEGATIVE 04/29/2019 1715   HGBUR NEGATIVE 04/29/2019 [place] 04/29/2019 1715   KETONESUR NEGATIVE 04/29/2019 1715   PROTEINUR NEGATIVE 04/29/2019 1715   UROBILINOGEN 0.2 10/26/2012 1115   NITRITE NEGATIVE 04/29/2019 1715   LEUKOCYTESUR NEGATIVE 04/29/2019 1715   Sepsis Labs: @LABRCNTIP (procalcitonin:4,lacticidven:4) )No results found for this or any previous visit (from the past 240 hour(s)).   Radiological Exams on Admission: DG Pelvis 1-2 Views  Result Date: 04/29/2019 CLINICAL DATA:  Pain status post fall EXAM: PELVIS - 1-2 VIEW COMPARISON:  July 20, 2014 FINDINGS: There is no evidence of pelvic fracture or diastasis. No pelvic bone lesions are seen. IMPRESSION: Negative. Electronically Signed   By: Constance Holster M.D.   On: 04/29/2019 16:41   CT Head Wo Contrast  Result Date: 04/29/2019 CLINICAL DATA:  Patient unresponsive. Minor head trauma. EXAM: CT HEAD WITHOUT CONTRAST CT CERVICAL SPINE WITHOUT CONTRAST TECHNIQUE: Multidetector CT imaging of the head and cervical spine was performed following the standard protocol without intravenous contrast. Multiplanar CT image reconstructions of the cervical spine were also generated. COMPARISON:  CT head 04/22/2016. FINDINGS: CT HEAD FINDINGS Brain: No evidence of acute infarction, hemorrhage, hydrocephalus, extra-axial collection or mass lesion/mass effect. Vascular: No hyperdense vessel or unexpected calcification. Skull: Normal. Negative for fracture or focal lesion. Sinuses/Orbits: No acute finding. Other: None. CT CERVICAL SPINE FINDINGS Alignment: Normal. Skull base and vertebrae: The vertebral body heights are well preserved. No fracture the Soft tissues and spinal canal: No prevertebral fluid or swelling. No visible canal hematoma. Disc levels:  Normal Upper chest: Unremarkable Other: None IMPRESSION: 1. No acute intracranial abnormalities. 2. No evidence for cervical spine fracture. Electronically Signed   By: Lovena Le  Clovis Riley M.D.   On: 04/29/2019 18:12   CT Cervical Spine Wo  Contrast  Result Date: 04/29/2019 CLINICAL DATA:  Patient unresponsive. Minor head trauma. EXAM: CT HEAD WITHOUT CONTRAST CT CERVICAL SPINE WITHOUT CONTRAST TECHNIQUE: Multidetector CT imaging of the head and cervical spine was performed following the standard protocol without intravenous contrast. Multiplanar CT image reconstructions of the cervical spine were also generated. COMPARISON:  CT head 04/22/2016. FINDINGS: CT HEAD FINDINGS Brain: No evidence of acute infarction, hemorrhage, hydrocephalus, extra-axial collection or mass lesion/mass effect. Vascular: No hyperdense vessel or unexpected calcification. Skull: Normal. Negative for fracture or focal lesion. Sinuses/Orbits: No acute finding. Other: None. CT CERVICAL SPINE FINDINGS Alignment: Normal. Skull base and vertebrae: The vertebral body heights are well preserved. No fracture the Soft tissues and spinal canal: No prevertebral fluid or swelling. No visible canal hematoma. Disc levels:  Normal Upper chest: Unremarkable Other: None IMPRESSION: 1. No acute intracranial abnormalities. 2. No evidence for cervical spine fracture. Electronically Signed   By: Kerby Moors M.D.   On: 04/29/2019 18:12   DG Chest Portable 1 View  Result Date: 04/29/2019 CLINICAL DATA:  Fall. EXAM: PORTABLE CHEST 1 VIEW COMPARISON:  07/20/2014. FINDINGS: The heart size and mediastinal contours are within normal limits. Both lungs are clear. The visualized skeletal structures are unremarkable. IMPRESSION: No active disease. Electronically Signed   By: Kerby Moors M.D.   On: 04/29/2019 16:38     Assessment/Plan Principal Problem:   Hypoglycemia Active Problems:   S/P gastric bypass   Hypothermia    1. Hypoglycemia likely from poor oral intake for which patient has been started on D10.  Check beta hydroxybutyrate and cortisol levels.  Hemoglobin A1c is pending. 2. Hyponatremia suspect likely from hypoglycemia however since patient procalcitonin levels elevated I  have ordered blood cultures and kept patient on empiric antibiotics.  Follow cortisol level centimeters level. 3. Macrocytic anemia check anemia panel specifically B12 and folate levels.  Further recommendation based on these levels. 4. Bilateral lower extremity edema and third spacing of fluid likely from low albumin which could be from poor nutrition but also have to rule out any proteinuria or liver pathology for which I have ordered sonogram of the abdomen and elastography along with UA.  Check 2D echo. 5. Bilateral lower extremity weakness likely from edema.  However since patient does have significant anemia with macrocytic picture will have to make sure folate and B12 levels are normal.  Also will check MRI of the T-spine L-spine.  CT head and CT C-spine unremarkable. 6. Chronic kidney disease stage III creatinine appears to be at baseline. 7. Severe protein calorie malnutrition will need nutrition input. 8. Bipolar disorder anxiety on Seroquel and Xanax 9. History of chronic pain on oxycodone.  Given the multiple comorbidities with persistent hypoglycemia and hypothermia with possible sepsis-like picture will need inpatient status.  DVT prophylaxis: Lovenox. Code Status: Full code. Family Communication: Discussed with patient. Disposition Plan: To be determined. Consults called: None. Admission status: Inpatient.   Rise Patience MD [facility] Pager [phone].  If 7PM-7AM, please contact night-coverage [email or web address] Password [email or web address]  04/29/2019, 10:40 PM

## 2019-04-29 NOTE — ED Triage Notes (Signed)
Pt to triage via [facility] from home.  Boyfriend last saw her normal last night.  He came home at 2:15 and found her in same spot.  CBG 23. Pt unresponsive.  EMS administered 1mg  Glucagon.  IV initiated in foot and 12.5 mg of D10.  IV pulled out PTA.  CBG 79.  Pt remains lethargic.  Boyfriend reports that pt does not eat.   No IV access at this time.

## 2019-04-29 NOTE — ED Provider Notes (Signed)
[facility] EMERGENCY DEPARTMENT Provider Note   CSN: [id number] Arrival date & time: 04/29/19  1533     History Chief Complaint  Patient presents with  . Hypoglycemia    Angie [name] is a 54 y.o. female history EtOH abuse, bipolar, chronic pain, GERD, hypertension, migraines, seizure, diabetes.  History per EMS/triage: From home last seen normal last night, CBG 23 on EMS arrival and patient unresponsive.  1 mg glucagon given along with 12.5 mg D10.  This was given in the foot.  CBG improved to 79, patient remained lethargic. - On my initial evaluation the patient she is lethargic, no acute distress.  She is oriented to self, place but not to time or events.  She reports that she has been feeling weaker over the past few days.  She reports that she is having generalized body aches at this time but reports this is due to her chronic pain and is requesting oxycodone.  She denies any fevers.  Reports that she thinks she fell yesterday but cannot remember the events surrounding the fall. - History obtained from patient's boyfriend Mr. Owens Shark.  He reports that he came home at 1 AM last night and found patient sleeping on the couch, he carried patient to bed and she said hello to him.  He reports that he then went to church this morning and upon returning home he found patient lying in the same position.  He reports that the children at home told him that the patient was not responding to them.  Mr. Owens Shark tried to wake up the patient and she only ground and had some tears dribble out of her eyes and her eyelids were fluttering strangely.  He called EMS.  On EMS arrival CBG 23.  He denies history of similar in the past, he states that she does not take any diabetic medications regularly.  Reports that over the past 1 month patient has appeared more short of breath than normal but has not been complaining of any abdominal pain.  Reports that she had a fall yesterday, no injury from  this fall she slumped over and slid out of a chair.  No history of fever/chills, vomiting/diarrhea, dysuria, injury, headaches, slurred speech or any additional concerns.  HPI     Past Medical History:  Diagnosis Date  . Abnormal Pap smear   . Alcohol abuse   . Anemia    IDA  . Anxiety    Takes xanax  . Arthritis    left hip/knees, lower spine  . Bipolar disorder ([place])   . Blood transfusion without reported diagnosis last done 04-25-17  . Bulging lumbar disc   . Bursitis    left shoulder  . Chronic lower back pain   . Cut    right middle finger with knife small cut healing pt instructed to keep clean and dry no redness or drainage  . Depression   . Diverticulitis   . Fibromyalgia   . GERD (gastroesophageal reflux disease)   . Hypertension    none since weight loss  . Lactose intolerance in adult 2017  . Migraines    "weekly @ least" (04/26/2016)  . Seizures ([place])    one Dec. 2017 due to throat closing  . Type II diabetes mellitus ([place])    "before the gastric bypass" (04/26/2016) states she no longer has diabetes    Patient Active Problem List   Diagnosis Date Noted  . 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 Symptomatic Anemia 04/25/2017  . Symptomatic anemia 04/25/2017  . Abnormal loss of weight   . Dysphagia   . S/P percutaneous endoscopic gastrostomy (PEG) tube placement ([place]) 09/06/2016  . At risk for adverse drug event 08/25/2016  . Malnutrition following gastrointestinal surgery 08/16/2016  . Anastomotic ulcer 07/21/2016  . Transient alteration of awareness   . Alcohol use 04/27/2016  . Chronic pain syndrome 04/27/2016  . Iron deficiency anemia 04/27/2016  . B12 deficiency 04/27/2016  . Gastrojejunal anastomotic stricture   . Hypoglycemia 04/22/2016  . Syncope 04/22/2016  . Protein-calorie malnutrition, severe 04/22/2016  . Nausea and vomiting   . Abdominal pain, chronic, epigastric   . Hypotension 02/01/2016  .  Hyponatremia 02/01/2016  . Subacromial impingement of left shoulder 07/06/2013  . Trochanteric bursitis of left hip 05/11/2013  . Arthritis 05/16/2012  . S/P gastric bypass 05/16/2012    Past Surgical History:  Procedure Laterality Date  . BALLOON DILATION N/A 05/27/2016   Procedure: BALLOON DILATION;  Surgeon: Doran Stabler, MD;  Location: [facility];  Service: Gastroenterology;  Laterality: N/A;  . BALLOON DILATION N/A 04/21/2017   Procedure: BALLOON DILATION;  Surgeon: Doran Stabler, MD;  Location: [facility];  Service: Gastroenterology;  Laterality: N/A;  . CERVICAL CONE BIOPSY    . [place]; 1996; 1998; 2014  . CESAREAN SECTION WITH BILATERAL TUBAL LIGATION Bilateral 10/28/2012   Procedure: Repeat cesarean section with delivery of baby boy at 13. Apgars 8/9.  BILATERAL TUBAL LIGATION;  Surgeon: Florian Buff, MD;  Location: [facility];  Service: Obstetrics;  Laterality: Bilateral;  . DILATION AND CURETTAGE OF UTERUS    . ESOPHAGOGASTRODUODENOSCOPY N/A 05/27/2016   Procedure: ESOPHAGOGASTRODUODENOSCOPY (EGD);  Surgeon: Doran Stabler, MD;  Location: [facility];  Service: Gastroenterology;  Laterality: N/A;  . ESOPHAGOGASTRODUODENOSCOPY (EGD) WITH PROPOFOL N/A 04/23/2016   Procedure: ESOPHAGOGASTRODUODENOSCOPY (EGD) WITH PROPOFOL;  Surgeon: Doran Stabler, MD;  Location: [facility];  Service: Endoscopy;  Laterality: N/A;  . ESOPHAGOGASTRODUODENOSCOPY (EGD) WITH PROPOFOL N/A 01/03/2017   Procedure: ESOPHAGOGASTRODUODENOSCOPY (EGD) WITH PROPOFOL;  Surgeon: Doran Stabler, MD;  Location: [facility];  Service: Gastroenterology;  Laterality: N/A;  . ESOPHAGOGASTRODUODENOSCOPY (EGD) WITH PROPOFOL N/A 04/21/2017   Procedure: ESOPHAGOGASTRODUODENOSCOPY (EGD) WITH PROPOFOL;  Surgeon: Doran Stabler, MD;  Location: [facility];  Service: Gastroenterology;  Laterality: N/A;  . GASTROSTOMY N/A 08/16/2016   Procedure: LAPAROSCOPIC INSERTION OF GASTROSTOMY TUBE  IN REMNANT STOMACH;  Surgeon: Arta Bruce Kinsinger, MD;  Location: [facility];  Service: General;  Laterality: N/A;  . GASTROSTOMY N/A 05/16/2017   Procedure: Replacement of  GASTROSTOMY TUBE;  Surgeon: Kieth Brightly Arta Bruce, MD;  Location: [facility];  Service: General;  Laterality: N/A;  . LAPAROSCOPIC REVISION OF GASTROJEJUNOSTOMY Left 05/16/2017   Procedure: LAPAROSCOPIC REVISION OF GASTROJEJUNOSTOMY;  Surgeon: [name], MD;  Location: [facility];  Service: General;  Laterality: Left;  . ROUX-EN-Y GASTRIC BYPASS  2007  . TUBAL LIGATION  2014     OB History    Gravida  6   Para  4   Term  4   Preterm  0   AB  2   Living  4     SAB  2   TAB      Ectopic      Multiple      Live Births  4           Family History  Problem Relation  Age of Onset  . Diabetes Father   . Heart disease Father   . Depression Maternal Grandmother   . Heart disease Maternal Grandfather   . Depression Paternal Grandmother   . Colon cancer Paternal Grandfather   . Pancreatic cancer Paternal Grandfather     Social History   Tobacco Use  . Smoking status: Former Smoker    Packs/day: 0.50    Years: 4.00    Pack years: 2.00    Types: Cigarettes    Quit date: 03/16/2012    Years since quitting: 7.1  . Smokeless tobacco: Never Used  Substance Use Topics  . Alcohol use: Yes    Alcohol/week: 17.0 standard drinks    Types: 6 Glasses of wine, 11 Shots of liquor per week    Comment: alcohol tx none x 1 year  . Drug use: No    Home Medications Prior to Admission medications   Medication Sig Start Date End Date Taking? Authorizing Provider  oxyCODONE (ROXICODONE) 15 MG immediate release tablet Take 15 mg by mouth every 6 (six) hours. 01/12/18  Yes [provider]  ALPRAZolam Duanne Moron) 1 MG tablet Take 1 tablet (1 mg total) by mouth at bedtime as needed for anxiety. Patient taking differently: Take 1 mg by mouth 3 (three) times daily.  08/27/16   Lauree Chandler, NP  CARAFATE 1  GM/10ML suspension Take 10 mLs by mouth 3 (three) times daily before meals. 01/04/18   [provider]  cyclobenzaprine (FLEXERIL) 10 MG tablet Take 10 mg by mouth 3 (three) times daily. 12/28/17   [provider]  cyclobenzaprine (FLEXERIL) 5 MG tablet Take 1 tablet (5 mg total) by mouth 3 (three) times daily as needed for muscle spasms. Patient not taking: Reported on 01/18/2018 08/20/16   Kinsinger, Arta Bruce, MD  erythromycin ophthalmic ointment Place a 1/2 inch ribbon of ointment into the lower eyelid 4 times daily for 7 days Patient not taking: Reported on 01/18/2018 09/24/17   Rodell Perna A, PA-C  famotidine (PEPCID) 40 MG/5ML suspension Take 2.5 mLs by mouth 2 (two) times daily. 12/31/17   [provider]  GAS RELIEF 20 MG/0.3ML drops Take 1 mL by mouth every 6 (six) hours as needed for flatulence.  11/25/17   [provider]  lidocaine (XYLOCAINE) 5 % ointment Apply 1 application topically 3 (three) times daily. Apply to Bilateral shoulders 05/11/16   [provider]  Multiple Vitamins-Minerals (MULTIVITAMIN WITH IRON-MINERALS) liquid Take 5 mLs by mouth daily.    [provider]  Nutritional Supplements (FEEDING SUPPLEMENT, JEVITY 1.2 CAL,) LIQD Place 1,000 mLs into feeding tube daily. Put tube feeds via G tube from 6pm to 7am at a rate of 24m/h Patient not taking: Reported on 01/18/2018 05/24/17   Kinsinger, LArta Bruce MD  OLANZapine (ZYPREXA) 2.5 MG tablet Take 2.5 mg by mouth daily. 12/25/17   [provider]  ondansetron (ZOFRAN-ODT) 8 MG disintegrating tablet Take 8 mg by mouth every 8 (eight) hours as needed. 01/12/18   [provider]  oxyCODONE (ROXICODONE) 5 MG/5ML solution Take 15 mLs (15 mg total) by mouth every 6 (six) hours as needed for moderate pain. Patient not taking: Reported on 01/18/2018 05/24/17   Kinsinger, LArta Bruce MD  oxymetazoline (AFRIN) 0.05 % nasal spray Place 2 sprays into both nostrils 2 (two) times  daily as needed for congestion.    [provider]  pantoprazole (PROTONIX) 40 MG tablet Take 1 tablet (40 mg total) by mouth at bedtime.  05/24/17   Kinsinger, Arta Bruce, MD  predniSONE (STERAPRED UNI-PAK 21 TAB) 10 MG (21) TBPK tablet Take by mouth daily. Take 6 tabs by mouth daily  for 2 days, then 5 tabs for 2 days, then 4 tabs for 2 days, then 3 tabs for 2 days, 2 tabs for 2 days, then 1 tab by mouth daily for 2 days 01/18/18   Rodell Perna A, PA-C  QUEtiapine (SEROQUEL) 100 MG tablet Take 1 tablet (100 mg total) by mouth at bedtime. 07/25/16 01/18/18  Roxan Hockey, MD    Allergies    Iron  Review of Systems   Review of Systems Ten systems are reviewed and are negative for acute change except as noted in the HPI  Physical Exam Updated Vital Signs BP (!) 138/96 (BP Location: Right Arm)   Pulse 66   Temp (!) 91.4 F (33 C) (Rectal)   Resp 14   SpO2 99%   Physical Exam Constitutional:      General: She is not in acute distress.    Appearance: Normal appearance. She is well-developed. She is not ill-appearing or diaphoretic.  HENT:     Head: Normocephalic and atraumatic. No raccoon eyes or Battle's sign.     Jaw: There is normal jaw occlusion. No trismus.     Right Ear: External ear normal.     Left Ear: External ear normal.     Nose: Nose normal.     Right Nostril: No epistaxis.     Left Nostril: No epistaxis.     Mouth/Throat:     Mouth: Mucous membranes are moist.     Pharynx: Oropharynx is clear.  Eyes:     General: Vision grossly intact. Gaze aligned appropriately.     Extraocular Movements: Extraocular movements intact.     Conjunctiva/sclera: Conjunctivae normal.     Pupils: Pupils are equal, round, and reactive to light.     Comments: Visual fields grossly intact bilaterally.  Neck:     Trachea: Trachea and phonation normal. No tracheal deviation.  Cardiovascular:     Rate and Rhythm: Normal rate and regular rhythm.  Pulmonary:     Effort: Pulmonary  effort is normal. No accessory muscle usage or respiratory distress.     Breath sounds: Normal air entry.  Abdominal:     General: Abdomen is protuberant. There is no distension.     Palpations: Abdomen is soft.     Tenderness: There is no abdominal tenderness. There is no guarding or rebound.  Musculoskeletal:        General: No tenderness. Normal range of motion.     Cervical back: Normal range of motion and neck supple. No spinous process tenderness or muscular tenderness.     Right lower leg: 2+ Edema present.     Left lower leg: 2+ Edema present.     Comments: No midline C/T/L spinal tenderness to palpation, no paraspinal muscle tenderness, no deformity, crepitus, or step-off noted. No sign of injury to the neck or back. - Hips stable to compression bilaterally without pain.  Patient's knees brought to chest without pain or crepitus.  All major joints brought through appropriate range of motion without crepitus or evidence of pain.  Skin:    General: Skin is warm and dry.  Neurological:     Mental Status: She is alert.     GCS: GCS eye subscore is 4. GCS verbal subscore is 5. GCS motor subscore is 6.     Comments: Speech is clear  and goal oriented, follows commands Major Cranial nerves without deficit, no facial droop Moves extremities without ataxia, coordination intact  Psychiatric:        Behavior: Behavior normal.     ED Results / Procedures / Treatments   Labs (all labs ordered are listed, but only abnormal results are displayed) Labs Reviewed  BASIC METABOLIC PANEL - Abnormal; Notable for the following components:      Result Value   Chloride 119 (*)    CO2 10 (*)    Glucose, Bld 100 (*)    Creatinine, Ser 1.55 (*)    Calcium 7.7 (*)    GFR calc non Af Amer 38 (*)    GFR calc Af Amer 44 (*)    All other components within normal limits  CBC - Abnormal; Notable for the following components:   WBC 2.0 (*)    RBC 2.12 (*)    Hemoglobin 7.4 (*)    HCT 24.6 (*)     MCV 116.0 (*)    MCH 34.9 (*)    RDW 19.3 (*)    All other components within normal limits  DIFFERENTIAL - Abnormal; Notable for the following components:   Neutro Abs 1.3 (*)    Lymphs Abs 0.4 (*)    All other components within normal limits  AMMONIA - Abnormal; Notable for the following components:   Ammonia 38 (*)    All other components within normal limits  RAPID URINE DRUG SCREEN, HOSP PERFORMED - Abnormal; Notable for the following components:   Opiates POSITIVE (*)    Benzodiazepines POSITIVE (*)    All other components within normal limits  D-DIMER, QUANTITATIVE (NOT AT [facility]) - Abnormal; Notable for the following components:   D-Dimer, Quant 2.04 (*)    All other components within normal limits  LACTATE DEHYDROGENASE - Abnormal; Notable for the following components:   LDH 333 (*)    All other components within normal limits  FERRITIN - Abnormal; Notable for the following components:   Ferritin 320 (*)    All other components within normal limits  FIBRINOGEN - Abnormal; Notable for the following components:   Fibrinogen 86 (*)    All other components within normal limits  C-REACTIVE PROTEIN - Abnormal; Notable for the following components:   CRP 2.7 (*)    All other components within normal limits  HEPATIC FUNCTION PANEL - Abnormal; Notable for the following components:   Total Protein 5.8 (*)    Albumin 2.4 (*)    AST 53 (*)    Alkaline Phosphatase 213 (*)    Bilirubin, Direct 0.6 (*)    All other components within normal limits  CBC - Abnormal; Notable for the following components:   WBC 2.0 (*)    RBC 2.13 (*)    Hemoglobin 7.4 (*)    HCT 24.7 (*)    MCV 116.0 (*)    MCH 34.7 (*)    RDW 19.4 (*)    nRBC 1.5 (*)    All other components within normal limits  CBG MONITORING, ED - Abnormal; Notable for the following components:   Glucose-Capillary 119 (*)    All other components within normal limits  CBG MONITORING, ED - Abnormal; Notable for the following  components:   Glucose-Capillary 31 (*)    All other components within normal limits  POCT I-STAT EG7 - Abnormal; Notable for the following components:   pCO2, Ven 31.7 (*)    pO2, Ven 52.0 (*)    Bicarbonate  15.1 (*)    TCO2 16 (*)    Acid-base deficit 11.0 (*)    HCT 26.0 (*)    Hemoglobin 8.8 (*)    All other components within normal limits  CBG MONITORING, ED - Abnormal; Notable for the following components:   Glucose-Capillary 188 (*)    All other components within normal limits  CBG MONITORING, ED - Abnormal; Notable for the following components:   Glucose-Capillary 213 (*)    All other components within normal limits  TROPONIN I (HIGH SENSITIVITY) - Abnormal; Notable for the following components:   Troponin I (High Sensitivity) 44 (*)    All other components within normal limits  CULTURE, BLOOD (ROUTINE X 2)  CULTURE, BLOOD (ROUTINE X 2)  SARS CORONAVIRUS 2 (TAT 6-24 HRS)  URINALYSIS, ROUTINE W REFLEX MICROSCOPIC  ETHANOL  LACTIC ACID, PLASMA  PROCALCITONIN  MAGNESIUM  PHOSPHORUS  RAPID HIV SCREEN (HIV 1/2 AB+AG)  LIPASE, BLOOD  TRIGLYCERIDES  TSH  LACTIC ACID, PLASMA  BLOOD GAS, VENOUS  RPR  CORTISOL  POC SARS CORONAVIRUS 2 AG -  ED  POC OCCULT BLOOD, ED  I-STAT BETA HCG BLOOD, ED ([facility], [facility], [facility] ONLY)  TYPE AND SCREEN  TROPONIN I (HIGH SENSITIVITY)    EKG EKG Interpretation  Date/Time:  Sunday April 29 2019 16:14:24 EST Ventricular Rate:  53 PR Interval:    QRS Duration: 91 QT Interval:  490 QTC Calculation: 461 R Axis:   51 Text Interpretation: Sinus rhythm Low voltage, precordial leads bradycardia with prolonged intervals otherwise similar to previous Confirmed by Charlesetta Shanks [phone]) on 04/29/2019 4:25:04 PM   Radiology DG Pelvis 1-2 Views  Result Date: 04/29/2019 CLINICAL DATA:  Pain status post fall EXAM: PELVIS - 1-2 VIEW COMPARISON:  July 20, 2014 FINDINGS: There is no evidence of pelvic fracture or diastasis. No pelvic bone lesions are seen.  IMPRESSION: Negative. Electronically Signed   By: Constance Holster M.D.   On: 04/29/2019 16:41   CT Head Wo Contrast  Result Date: 04/29/2019 CLINICAL DATA:  Patient unresponsive. Minor head trauma. EXAM: CT HEAD WITHOUT CONTRAST CT CERVICAL SPINE WITHOUT CONTRAST TECHNIQUE: Multidetector CT imaging of the head and cervical spine was performed following the standard protocol without intravenous contrast. Multiplanar CT image reconstructions of the cervical spine were also generated. COMPARISON:  CT head 04/22/2016. FINDINGS: CT HEAD FINDINGS Brain: No evidence of acute infarction, hemorrhage, hydrocephalus, extra-axial collection or mass lesion/mass effect. Vascular: No hyperdense vessel or unexpected calcification. Skull: Normal. Negative for fracture or focal lesion. Sinuses/Orbits: No acute finding. Other: None. CT CERVICAL SPINE FINDINGS Alignment: Normal. Skull base and vertebrae: The vertebral body heights are well preserved. No fracture the Soft tissues and spinal canal: No prevertebral fluid or swelling. No visible canal hematoma. Disc levels:  Normal Upper chest: Unremarkable Other: None IMPRESSION: 1. No acute intracranial abnormalities. 2. No evidence for cervical spine fracture. Electronically Signed   By: Kerby Moors M.D.   On: 04/29/2019 18:12   CT Cervical Spine Wo Contrast  Result Date: 04/29/2019 CLINICAL DATA:  Patient unresponsive. Minor head trauma. EXAM: CT HEAD WITHOUT CONTRAST CT CERVICAL SPINE WITHOUT CONTRAST TECHNIQUE: Multidetector CT imaging of the head and cervical spine was performed following the standard protocol without intravenous contrast. Multiplanar CT image reconstructions of the cervical spine were also generated. COMPARISON:  CT head 04/22/2016. FINDINGS: CT HEAD FINDINGS Brain: No evidence of acute infarction, hemorrhage, hydrocephalus, extra-axial collection or mass lesion/mass effect. Vascular: No hyperdense vessel or unexpected calcification. Skull: Normal.  Negative for fracture or focal lesion. Sinuses/Orbits: No acute finding. Other: None. CT CERVICAL SPINE FINDINGS Alignment: Normal. Skull base and vertebrae: The vertebral body heights are well preserved. No fracture the Soft tissues and spinal canal: No prevertebral fluid or swelling. No visible canal hematoma. Disc levels:  Normal Upper chest: Unremarkable Other: None IMPRESSION: 1. No acute intracranial abnormalities. 2. No evidence for cervical spine fracture. Electronically Signed   By: Kerby Moors M.D.   On: 04/29/2019 18:12   DG Chest Portable 1 View  Result Date: 04/29/2019 CLINICAL DATA:  Fall. EXAM: PORTABLE CHEST 1 VIEW COMPARISON:  07/20/2014. FINDINGS: The heart size and mediastinal contours are within normal limits. Both lungs are clear. The visualized skeletal structures are unremarkable. IMPRESSION: No active disease. Electronically Signed   By: Kerby Moors M.D.   On: 04/29/2019 16:38    Procedures .Critical Care Performed by: Deliah Boston, PA-C Authorized by: Deliah Boston, PA-C   Critical care provider statement:    Critical care time (minutes):  50   Critical care was necessary to treat or prevent imminent or life-threatening deterioration of the following conditions: Intractable hypoglycemia, hypothermia.   Critical care was time spent personally by me on the following activities:  Discussions with consultants, evaluation of patient's response to treatment, examination of patient, ordering and performing treatments and interventions, ordering and review of laboratory studies, ordering and review of radiographic studies, pulse oximetry, re-evaluation of patient's condition, obtaining history from patient or surrogate and review of old charts   (including critical care time)  Medications Ordered in ED Medications  dextrose 10 % infusion ( Intravenous New Bag/Given 04/29/19 1819)  sodium chloride flush (NS) 0.9 % injection 3 mL (3 mLs Intravenous Given 04/29/19  1752)  sodium chloride 0.9 % bolus 1,000 mL (0 mLs Intravenous Stopped 04/29/19 1815)  dextrose 50 % solution 50 mL (50 mLs Intravenous Given 04/29/19 1814)  dextrose 50 % solution (  Given 04/29/19 1819)    ED Course  I have reviewed the triage vital signs and the nursing notes.  Pertinent labs & imaging results that were available during my care of the patient were reviewed by me and considered in my medical decision making (see chart for details).  Clinical Course as of Apr 29 2011  Nancy Fetter Apr 29, 2019  1611 [id number]   [BM]  1612 Mr. Owens Shark   [BM]    Clinical Course User Index [BM] Gari Crown   MDM Rules/Calculators/A&P                      On arrival patient is lethargic, alert to self and place not to time and event.  Reports chronic pain no focal complaint.  Cranial nerves intact, respirations even and unlabored, heart regular rate and rhythm, abdomen protuberant, nontender no peritoneal signs, neurovascularly intact to all 4 extremities, equal 2+ bilateral lower extremity edema.  No signs of trauma.  Moves all major joints without evidence of pain.  CBG improved on ED arrival.  Blood work ordered, rapid Covid test ordered.  Will obtain imaging of head, C-spine, chest and pelvis as patient with history of fall yesterday. - Rectal temperature 91.6 F, patient placed on Bair hugger. - Ethanol negative Rapid Covid negative UDS positive for opiates and benzodiazepines Urinalysis within normal limits EEG 7 with bicarb 15.1, acid base 11, CO2 31.7, O2 52.0, hemoglobin 8.8 Beta-hCG negative Procalcitonin within normal limits Ferritin 320 Fibrinogen 86 Type and screen B+ Lipase  within normal limits Phosphorus within normal limits Magnesium within normal limits Triglycerides within normal limits LFTs, alk phos 213, AST 53, protein 5.8, albumin 2.4 High-sensitivity troponin: 44 LDH 333 CRP 2.7 HIV negative Ammonia 38 D-dimer 2.04 Hemoccult negative Lactic 1.0 CBC  with leukopenia of 2.0, hemoglobin 7.4 BMP creatinine 1.55, bicarb 10 - CT head/cervical spine:  IMPRESSION:  1. No acute intracranial abnormalities.  2. No evidence for cervical spine fracture.   CXR:  IMPRESSION:  No active disease   DG Pelvis:  IMPRESSION:  Negative.   EKG: Sinus rhythm Low voltage, precordial leads bradycardia with prolonged intervals otherwise similar to previous Confirmed by Charlesetta Shanks [phone]) on 04/29/2019 4:25:04 PM - Informed by nursing staff CBG 31, dextrose ampule given, dextrose infusion ordered. - Lab work today shows AKI, leukopenia, anemia, elevation of inflammatory markers, troponin 44 no chest pain, decreased bicarb.  Patient seen and evaluated by Dr. Vallery Ridge.  Unclear as to etiology of patient's symptoms, there is no history of overdose, patient lethargic but does not endorse any suicidal ideations.  Patient does have history of gastric bypass surgery abdomen somewhat protuberant.  Question whether this is a chronic malnutrition also concerned whether intra-abdominal malignancy may be cause.  CT chest, abdomen, pelvis ordered.  Patient will need admission for intractable hypoglycemia.  Will discuss case with hospitalist for admission. - Discussed with family member Mr. Owens Shark, states understanding of care plan agreeable for admission. - 7 PM: Patient reevaluated sleeping, easily arousable to voice.  Currently on dextrose infusion and bear hugger.  Reports she is feeling somewhat improved.  She has no new concerns.  Alert to self, place and time.  Vital signs stable. - 7:12 PM: Discussed case with hospitalist Dr. Hal Hope, has asked for addition of cortisol level and Covid PCR.  Angie [name] was evaluated in Emergency Department on 04/29/2019 for the symptoms described in the history of present illness. She was evaluated in the context of the global COVID-19 pandemic, which necessitated consideration that the patient might be at risk for infection  with the SARS-CoV-2 virus that causes COVID-19. Institutional protocols and algorithms that pertain to the evaluation of patients at risk for COVID-19 are in a state of rapid change based on information released by regulatory bodies including the CDC and federal and state organizations. These policies and algorithms were followed during the patient's care in the ED.  Note: Portions of this report may have been transcribed using voice recognition software. Every effort was made to ensure accuracy; however, inadvertent computerized transcription errors may still be present. Final Clinical Impression(s) / ED Diagnoses Final diagnoses:  Hypoglycemia    Rx / DC Orders ED Discharge Orders    None       Gari Crown 04/29/19 2013    Charlesetta Shanks, MD 04/29/19 2031

## 2019-04-30 ENCOUNTER — Other Ambulatory Visit: Payer: Self-pay

## 2019-04-30 ENCOUNTER — Inpatient Hospital Stay (HOSPITAL_COMMUNITY): Payer: Medicare Other

## 2019-04-30 ENCOUNTER — Observation Stay (HOSPITAL_COMMUNITY): Payer: Medicare Other

## 2019-04-30 DIAGNOSIS — K9189 Other postprocedural complications and disorders of digestive system: Secondary | ICD-10-CM | POA: Diagnosis not present

## 2019-04-30 DIAGNOSIS — Q240 Dextrocardia: Secondary | ICD-10-CM

## 2019-04-30 DIAGNOSIS — R112 Nausea with vomiting, unspecified: Secondary | ICD-10-CM | POA: Diagnosis not present

## 2019-04-30 DIAGNOSIS — S36119A Unspecified injury of liver, initial encounter: Secondary | ICD-10-CM | POA: Diagnosis present

## 2019-04-30 DIAGNOSIS — F319 Bipolar disorder, unspecified: Secondary | ICD-10-CM | POA: Diagnosis present

## 2019-04-30 DIAGNOSIS — Z20822 Contact with and (suspected) exposure to covid-19: Secondary | ICD-10-CM | POA: Diagnosis present

## 2019-04-30 DIAGNOSIS — R634 Abnormal weight loss: Secondary | ICD-10-CM | POA: Diagnosis not present

## 2019-04-30 DIAGNOSIS — R911 Solitary pulmonary nodule: Secondary | ICD-10-CM | POA: Diagnosis present

## 2019-04-30 DIAGNOSIS — K9423 Gastrostomy malfunction: Secondary | ICD-10-CM | POA: Diagnosis not present

## 2019-04-30 DIAGNOSIS — R1310 Dysphagia, unspecified: Secondary | ICD-10-CM | POA: Diagnosis not present

## 2019-04-30 DIAGNOSIS — D539 Nutritional anemia, unspecified: Secondary | ICD-10-CM | POA: Diagnosis present

## 2019-04-30 DIAGNOSIS — R627 Adult failure to thrive: Secondary | ICD-10-CM | POA: Diagnosis present

## 2019-04-30 DIAGNOSIS — R188 Other ascites: Secondary | ICD-10-CM | POA: Diagnosis present

## 2019-04-30 DIAGNOSIS — E43 Unspecified severe protein-calorie malnutrition: Secondary | ICD-10-CM | POA: Diagnosis present

## 2019-04-30 DIAGNOSIS — K219 Gastro-esophageal reflux disease without esophagitis: Secondary | ICD-10-CM | POA: Diagnosis present

## 2019-04-30 DIAGNOSIS — K9171 Accidental puncture and laceration of a digestive system organ or structure during a digestive system procedure: Secondary | ICD-10-CM | POA: Diagnosis not present

## 2019-04-30 DIAGNOSIS — T68XXXA Hypothermia, initial encounter: Secondary | ICD-10-CM | POA: Diagnosis not present

## 2019-04-30 DIAGNOSIS — E871 Hypo-osmolality and hyponatremia: Secondary | ICD-10-CM | POA: Diagnosis present

## 2019-04-30 DIAGNOSIS — Z6824 Body mass index (BMI) 24.0-24.9, adult: Secondary | ICD-10-CM | POA: Diagnosis not present

## 2019-04-30 DIAGNOSIS — K760 Fatty (change of) liver, not elsewhere classified: Secondary | ICD-10-CM | POA: Diagnosis present

## 2019-04-30 DIAGNOSIS — Z9884 Bariatric surgery status: Secondary | ICD-10-CM | POA: Diagnosis not present

## 2019-04-30 DIAGNOSIS — N179 Acute kidney failure, unspecified: Secondary | ICD-10-CM | POA: Diagnosis present

## 2019-04-30 DIAGNOSIS — D61818 Other pancytopenia: Secondary | ICD-10-CM | POA: Diagnosis present

## 2019-04-30 DIAGNOSIS — R64 Cachexia: Secondary | ICD-10-CM | POA: Diagnosis present

## 2019-04-30 DIAGNOSIS — I129 Hypertensive chronic kidney disease with stage 1 through stage 4 chronic kidney disease, or unspecified chronic kidney disease: Secondary | ICD-10-CM | POA: Diagnosis present

## 2019-04-30 DIAGNOSIS — F419 Anxiety disorder, unspecified: Secondary | ICD-10-CM | POA: Diagnosis present

## 2019-04-30 DIAGNOSIS — N1330 Unspecified hydronephrosis: Secondary | ICD-10-CM | POA: Diagnosis present

## 2019-04-30 DIAGNOSIS — N183 Chronic kidney disease, stage 3 unspecified: Secondary | ICD-10-CM | POA: Diagnosis present

## 2019-04-30 DIAGNOSIS — E872 Acidosis: Secondary | ICD-10-CM | POA: Diagnosis present

## 2019-04-30 DIAGNOSIS — E162 Hypoglycemia, unspecified: Secondary | ICD-10-CM | POA: Diagnosis present

## 2019-04-30 DIAGNOSIS — W010XXA Fall on same level from slipping, tripping and stumbling without subsequent striking against object, initial encounter: Secondary | ICD-10-CM | POA: Diagnosis present

## 2019-04-30 DIAGNOSIS — Y92009 Unspecified place in unspecified non-institutional (private) residence as the place of occurrence of the external cause: Secondary | ICD-10-CM | POA: Diagnosis not present

## 2019-04-30 DIAGNOSIS — J90 Pleural effusion, not elsewhere classified: Secondary | ICD-10-CM | POA: Diagnosis present

## 2019-04-30 DIAGNOSIS — E161 Other hypoglycemia: Secondary | ICD-10-CM | POA: Diagnosis present

## 2019-04-30 LAB — CBC WITH DIFFERENTIAL/PLATELET
Abs Immature Granulocytes: 0.01 10*3/uL (ref 0.00–0.07)
Abs Immature Granulocytes: 0.02 10*3/uL (ref 0.00–0.07)
Basophils Absolute: 0 10*3/uL (ref 0.0–0.1)
Basophils Absolute: 0 10*3/uL (ref 0.0–0.1)
Basophils Relative: 0 %
Basophils Relative: 0 %
Eosinophils Absolute: 0 10*3/uL (ref 0.0–0.5)
Eosinophils Absolute: 0 10*3/uL (ref 0.0–0.5)
Eosinophils Relative: 0 %
Eosinophils Relative: 0 %
HCT: 19.7 % — ABNORMAL LOW (ref 36.0–46.0)
HCT: 28.3 % — ABNORMAL LOW (ref 36.0–46.0)
Hemoglobin: 6.4 g/dL — CL (ref 12.0–15.0)
Hemoglobin: 9 g/dL — ABNORMAL LOW (ref 12.0–15.0)
Immature Granulocytes: 0 %
Immature Granulocytes: 0 %
Lymphocytes Relative: 16 %
Lymphocytes Relative: 17 %
Lymphs Abs: 0.5 10*3/uL — ABNORMAL LOW (ref 0.7–4.0)
Lymphs Abs: 1 10*3/uL (ref 0.7–4.0)
MCH: 33.5 pg (ref 26.0–34.0)
MCH: 34.8 pg — ABNORMAL HIGH (ref 26.0–34.0)
MCHC: 31.8 g/dL (ref 30.0–36.0)
MCHC: 32.5 g/dL (ref 30.0–36.0)
MCV: 105.2 fL — ABNORMAL HIGH (ref 80.0–100.0)
MCV: 107.1 fL — ABNORMAL HIGH (ref 80.0–100.0)
Monocytes Absolute: 0.3 10*3/uL (ref 0.1–1.0)
Monocytes Absolute: 0.7 10*3/uL (ref 0.1–1.0)
Monocytes Relative: 12 %
Monocytes Relative: 9 %
Neutro Abs: 2.4 10*3/uL (ref 1.7–7.7)
Neutro Abs: 4.1 10*3/uL (ref 1.7–7.7)
Neutrophils Relative %: 71 %
Neutrophils Relative %: 75 %
Platelets: 179 10*3/uL (ref 150–400)
Platelets: 210 10*3/uL (ref 150–400)
RBC: 1.84 MIL/uL — ABNORMAL LOW (ref 3.87–5.11)
RBC: 2.69 MIL/uL — ABNORMAL LOW (ref 3.87–5.11)
RDW: 17.8 % — ABNORMAL HIGH (ref 11.5–15.5)
RDW: 18.9 % — ABNORMAL HIGH (ref 11.5–15.5)
WBC: 3.2 10*3/uL — ABNORMAL LOW (ref 4.0–10.5)
WBC: 5.9 10*3/uL (ref 4.0–10.5)
nRBC: 0 % (ref 0.0–0.2)
nRBC: 0 % (ref 0.0–0.2)

## 2019-04-30 LAB — COMPREHENSIVE METABOLIC PANEL
ALT: 26 U/L (ref 0–44)
ALT: 26 U/L (ref 0–44)
AST: 36 U/L (ref 15–41)
AST: 39 U/L (ref 15–41)
Albumin: 2 g/dL — ABNORMAL LOW (ref 3.5–5.0)
Albumin: 2 g/dL — ABNORMAL LOW (ref 3.5–5.0)
Alkaline Phosphatase: 173 U/L — ABNORMAL HIGH (ref 38–126)
Alkaline Phosphatase: 162 U/L — ABNORMAL HIGH (ref 38–126)
Anion gap: 7 (ref 5–15)
Anion gap: 13 (ref 5–15)
BUN: 12 mg/dL (ref 6–20)
BUN: 12 mg/dL (ref 6–20)
CO2: 15 mmol/L — ABNORMAL LOW (ref 22–32)
CO2: 13 mmol/L — ABNORMAL LOW (ref 22–32)
Calcium: 7.6 mg/dL — ABNORMAL LOW (ref 8.9–10.3)
Calcium: 7.7 mg/dL — ABNORMAL LOW (ref 8.9–10.3)
Chloride: 113 mmol/L — ABNORMAL HIGH (ref 98–111)
Chloride: 112 mmol/L — ABNORMAL HIGH (ref 98–111)
Creatinine, Ser: 1.39 mg/dL — ABNORMAL HIGH (ref 0.44–1.00)
Creatinine, Ser: 1.54 mg/dL — ABNORMAL HIGH (ref 0.44–1.00)
GFR calc Af Amer: 50 mL/min — ABNORMAL LOW (ref >60)
GFR calc Af Amer: 45 mL/min — ABNORMAL LOW (ref >60)
GFR calc non Af Amer: 43 mL/min — ABNORMAL LOW (ref >60)
GFR calc non Af Amer: 38 mL/min — ABNORMAL LOW (ref >60)
Glucose, Bld: 191 mg/dL — ABNORMAL HIGH (ref 70–99)
Glucose, Bld: 122 mg/dL — ABNORMAL HIGH (ref 70–99)
Potassium: 3.6 mmol/L (ref 3.5–5.1)
Potassium: 3.8 mmol/L (ref 3.5–5.1)
Sodium: 135 mmol/L (ref 135–145)
Sodium: 138 mmol/L (ref 135–145)
Total Bilirubin: 0.7 mg/dL (ref 0.3–1.2)
Total Bilirubin: 1.2 mg/dL (ref 0.3–1.2)
Total Protein: 4.7 g/dL — ABNORMAL LOW (ref 6.5–8.1)
Total Protein: 4.8 g/dL — ABNORMAL LOW (ref 6.5–8.1)

## 2019-04-30 LAB — FERRITIN: Ferritin: 247 ng/mL (ref 11–307)

## 2019-04-30 LAB — IRON AND TIBC
Iron: 59 ug/dL (ref 28–170)
TIBC: <70 ug/dL — ABNORMAL LOW (ref 250–450)

## 2019-04-30 LAB — CBG MONITORING, ED
Glucose-Capillary: 105 mg/dL — ABNORMAL HIGH (ref 70–99)
Glucose-Capillary: 114 mg/dL — ABNORMAL HIGH (ref 70–99)
Glucose-Capillary: 127 mg/dL — ABNORMAL HIGH (ref 70–99)
Glucose-Capillary: 136 mg/dL — ABNORMAL HIGH (ref 70–99)
Glucose-Capillary: 166 mg/dL — ABNORMAL HIGH (ref 70–99)
Glucose-Capillary: 28 mg/dL — CL (ref 70–99)
Glucose-Capillary: 55 mg/dL — ABNORMAL LOW (ref 70–99)
Glucose-Capillary: 62 mg/dL — ABNORMAL LOW (ref 70–99)
Glucose-Capillary: 75 mg/dL (ref 70–99)
Glucose-Capillary: 97 mg/dL (ref 70–99)

## 2019-04-30 LAB — MRSA PCR SCREENING: MRSA by PCR: NEGATIVE

## 2019-04-30 LAB — RETICULOCYTES
Immature Retic Fract: 25.7 % — ABNORMAL HIGH (ref 2.3–15.9)
RBC.: 1.86 MIL/uL — ABNORMAL LOW (ref 3.87–5.11)
Retic Count, Absolute: 76.1 10*3/uL (ref 19.0–186.0)
Retic Ct Pct: 4.1 % — ABNORMAL HIGH (ref 0.4–3.1)

## 2019-04-30 LAB — GLUCOSE, CAPILLARY
Glucose-Capillary: 49 mg/dL — ABNORMAL LOW (ref 70–99)
Glucose-Capillary: 161 mg/dL — ABNORMAL HIGH (ref 70–99)
Glucose-Capillary: 111 mg/dL — ABNORMAL HIGH (ref 70–99)
Glucose-Capillary: 110 mg/dL — ABNORMAL HIGH (ref 70–99)
Glucose-Capillary: 112 mg/dL — ABNORMAL HIGH (ref 70–99)
Glucose-Capillary: 150 mg/dL — ABNORMAL HIGH (ref 70–99)
Glucose-Capillary: 156 mg/dL — ABNORMAL HIGH (ref 70–99)

## 2019-04-30 LAB — TROPONIN I (HIGH SENSITIVITY)
Troponin I (High Sensitivity): 43 ng/L — ABNORMAL HIGH (ref <18)
Troponin I (High Sensitivity): 46 ng/L — ABNORMAL HIGH (ref <18)

## 2019-04-30 LAB — ECHOCARDIOGRAM COMPLETE

## 2019-04-30 LAB — PREPARE RBC (CROSSMATCH)

## 2019-04-30 LAB — HEMOGLOBIN A1C
Hgb A1c MFr Bld: 3.8 % — ABNORMAL LOW (ref 4.8–5.6)
Mean Plasma Glucose: 62.36 mg/dL

## 2019-04-30 LAB — VITAMIN B12: Vitamin B-12: 2423 pg/mL — ABNORMAL HIGH (ref 180–914)

## 2019-04-30 LAB — SARS CORONAVIRUS 2 (TAT 6-24 HRS): SARS Coronavirus 2: NEGATIVE

## 2019-04-30 LAB — CK: Total CK: 120 U/L (ref 38–234)

## 2019-04-30 LAB — BETA-HYDROXYBUTYRIC ACID: Beta-Hydroxybutyric Acid: 0.23 mmol/L (ref 0.05–0.27)

## 2019-04-30 LAB — FOLATE: Folate: 4.7 ng/mL — ABNORMAL LOW (ref >5.9)

## 2019-04-30 MED ORDER — VANCOMYCIN HCL 500 MG/100ML IV SOLN
500.0000 mg | INTRAVENOUS | Status: DC
  Administered 2019-05-01 – 2019-05-02 (×2): 500 mg via INTRAVENOUS
  Filled 2019-04-30 (×2): qty 100

## 2019-04-30 MED ORDER — VANCOMYCIN HCL 500 MG/100ML IV SOLN: 500.0000 mg | INTRAVENOUS | Status: DC

## 2019-04-30 MED ORDER — COSYNTROPIN 0.25 MG IJ SOLR
0.2500 mg | Freq: Once | INTRAMUSCULAR | Status: AC
  Administered 2019-05-01: 0.25 mg via INTRAVENOUS
  Filled 2019-04-30: qty 0.25

## 2019-04-30 MED ORDER — ALBUMIN HUMAN 25 % IV SOLN
12.5000 g | Freq: Once | INTRAVENOUS | Status: AC
  Administered 2019-04-30: 12.5 g via INTRAVENOUS
  Filled 2019-04-30 (×2): qty 50

## 2019-04-30 MED ORDER — SODIUM CHLORIDE 0.9% IV SOLUTION: Freq: Once | INTRAVENOUS | Status: AC

## 2019-04-30 MED ORDER — SODIUM CHLORIDE 0.9 % IV SOLN
2.0000 g | Freq: Two times a day (BID) | INTRAVENOUS | Status: DC
  Administered 2019-04-30 – 2019-05-04 (×8): 2 g via INTRAVENOUS
  Filled 2019-04-30 (×11): qty 2

## 2019-04-30 MED ORDER — DEXAMETHASONE SODIUM PHOSPHATE 4 MG/ML IJ SOLN
4.0000 mg | Freq: Two times a day (BID) | INTRAMUSCULAR | Status: DC
  Administered 2019-04-30: 4 mg via INTRAVENOUS
  Filled 2019-04-30: qty 1

## 2019-04-30 MED ORDER — LACTATED RINGERS IV SOLN: INTRAVENOUS | Status: DC

## 2019-04-30 MED ORDER — DEXTROSE 50 % IV SOLN
25.0000 g | INTRAVENOUS | Status: AC
  Administered 2019-04-30: 25 g via INTRAVENOUS

## 2019-04-30 MED ORDER — ALPRAZOLAM 0.5 MG PO TABS: 1.0000 mg | ORAL_TABLET | Freq: Three times a day (TID) | ORAL | Status: DC | PRN

## 2019-04-30 MED ORDER — DEXTROSE 50 % IV SOLN
INTRAVENOUS | Status: AC
  Filled 2019-04-30: qty 50

## 2019-04-30 MED ORDER — IOHEXOL 300 MG/ML  SOLN
80.0000 mL | Freq: Once | INTRAMUSCULAR | Status: AC | PRN
  Administered 2019-04-30: 80 mL via INTRAVENOUS

## 2019-04-30 NOTE — Progress Notes (Signed)
Pharmacy Antibiotic Note  Angie [name] is a 54 y.o. female admitted on 04/29/2019 with sepsis.  Pharmacy has been consulted for Vancomycin/Cefepime dosing. WBC is low, H/H is low, plts ok. Noted renal dysfunction.   Plan: Vancomycin 500 mg IV q24h >>Estimated AUC: 468 Cefepime 2g IV q12h Trend WBC, temp, renal function  F/U infectious work-up Drug levels as indicated  Temp (24hrs), Avg:92.1 F (33.4 C), Min:91.4 F (33 C), Max:93.3 F (34.1 C)  Recent Labs  Lab 04/29/19 1546 04/29/19 1650 04/29/19 2359  WBC 2.0*  2.0*  --  3.2*  CREATININE 1.55*  --   --   LATICACIDVEN  --  1.0  --     CrCl cannot be calculated (Unknown ideal weight.).    Allergies  Allergen Reactions  . Iron Anaphylaxis    Had acute allergy with tachycardia, attention and generalized itching shortly after receiving nulecit ( iron gluconate) infusion.    Narda Bonds, PharmD, BCPS Clinical Pharmacist Phone: [phone]

## 2019-04-30 NOTE — ED Notes (Signed)
Patient transported to CT 

## 2019-04-30 NOTE — ED Notes (Signed)
Breakfast ordered 

## 2019-04-30 NOTE — ED Notes (Signed)
pt just came back from MRI, CBG 28. D10 gtt had been stopped during scan. Gtt now resumed, pt given 1 OJ and Sprite now. pt has no appetite, but will drink fluids - MD notified. GCS 15, just lethargic

## 2019-04-30 NOTE — Progress Notes (Signed)
[facility] Progress Note  Patient: Angie [name] [id number]   PCP: Angie Junior, MD DOB: [birth year]   DOA: 04/29/2019   DOS: 04/30/2019   Date of Service: the patient was seen and examined on 04/30/2019  Chief Complaint  Patient presents with  . Hypoglycemia   Brief hospital course: Pt. with PMH of gastric bypass surgery, type II DM CKD 3, chronic anemia, chronic malnutrition; presented with complain of unresponsive episode, was found to have hypoglycemia.  Currently further plan is continue to further evaluate etiology of anasarca and treat hypoglycemia.  Subjective: Patient denies any acute complaint other than itching on the leg and pain in the leg.  No nausea no vomiting no fever no chills.  No chest pain no abdominal pain.  No diarrhea no constipation.  Denies any change in the medications.  Continues to report fatigue and tiredness  Assessment and Plan: Scheduled Meds: . [START ON 05/01/2019] cosyntropin  0.25 mg Intravenous Once  . dexamethasone (DECADRON) injection  4 mg Intravenous Q12H  . enoxaparin (LOVENOX) injection  40 mg Subcutaneous Daily  . OLANZapine  2.5 mg Oral Daily  . oxyCODONE  15 mg Oral Q6H  . pantoprazole  40 mg Oral QHS  . QUEtiapine  100 mg Oral QHS   Continuous Infusions: . ceFEPime (MAXIPIME) IV Stopped (04/30/19 1208)  . dextrose 75 mL/hr at 04/30/19 1149  . lactated ringers 50 mL/hr at 04/30/19 1143  . [START ON 05/01/2019] vancomycin     PRN Meds: acetaminophen **OR** acetaminophen, ALPRAZolam, ondansetron **OR** ondansetron (ZOFRAN) IV   1.  Severe refractory hypoglycemia  Unclear etiology Likely from poor oral intake for which patient has been started on D10.  Cortisol level is significantly elevated therefore less likely an etiology although we will still check cosyntropin stimulation test. Hemoglobin A1c is 3.8. Beta hydroxybutyric acid level is 0.23. We will check insulin C-peptide as well as proinsulin insulin level. Also added  Decadron for now due to refractoriness of the hyperglycemia. This could be gastric bypass surgery related hypoglycemia although this represents in 2 to 3 years after the surgery and patient gastric bypass was performed in 2007. Patient had to have G-tube placement due to persistent malnutrition in the past. Continue to monitor in the stepdown unit.  2. Hyponatremia, hypothermia. Possible sepsis Suspect likely from hypoglycemia however since patient procalcitonin levels elevated I have ordered blood cultures and kept patient on empiric antibiotics  3. Macrocytic anemia  anemia panel specifically B12 and folate levels.   Further recommendation based on these levels.  4. Bilateral lower extremity edema and third spacing of fluid likely from low albumin which could be from poor nutrition but also have to rule out any proteinuria or liver pathology Ultrasound liver shows evidence of CBD dilatation. We will get MRCP for further evaluation.  5. Bilateral lower extremity weakness likely from edema.  However since patient does have significant anemia with macrocytic picture will have to make sure folate and B12 levels are normal.   MRI of the T-spine and L-spine does not show any evidence of acute abnormality. CT head and CT C-spine are also negative. Continue to treat edema and PT OT consultation.  6. Chronic kidney disease stage III  creatinine appears to be at baseline.  7. Severe protein calorie malnutrition  will need nutrition input. At risk for refeeding syndrome.  8. Bipolar disorder anxiety  on Seroquel and Xanax Not sure if this is playing a role in patient's persistent malnutrition.  9. History  of chronic pain  on oxycodone.  Diet: Regular diet  DVT Prophylaxis: Subcutaneous Heparin    Advance goals of care discussion: Full code  Family Communication: no family was present at bedside, at the time of interview.  Disposition:  Discharge to be determined.  Consultants:  none Procedures: none  Antibiotics: Anti-infectives (From admission, onward)   Start     Dose/Rate Route Frequency Ordered Stop   05/01/19 0300  vancomycin (VANCOREADY) IVPB 500 mg/100 mL     500 mg 100 mL/hr over 60 Minutes Intravenous Every 24 hours 04/30/19 0833     04/30/19 2200  vancomycin (VANCOREADY) IVPB 500 mg/100 mL  Status:  Discontinued     500 mg 100 mL/hr over 60 Minutes Intravenous Every 24 hours 04/30/19 0103 04/30/19 0833   04/30/19 1000  ceFEPIme (MAXIPIME) 2 g in sodium chloride 0.9 % 100 mL IVPB     2 g 200 mL/hr over 30 Minutes Intravenous Every 12 hours 04/30/19 0103     04/29/19 2300  vancomycin (VANCOCIN) IVPB 1000 mg/200 mL premix     1,000 mg 200 mL/hr over 60 Minutes Intravenous  Once 04/29/19 2247 04/30/19 0447   04/29/19 2300  ceFEPIme (MAXIPIME) 2 g in sodium chloride 0.9 % 100 mL IVPB     2 g 200 mL/hr over 30 Minutes Intravenous  Once 04/29/19 2247 04/30/19 0020       Objective: Physical Exam: Vitals:   04/30/19 1415 04/30/19 1430 04/30/19 1608 04/30/19 1715  BP:  114/82 (!) 130/95   Pulse:      Resp: 12 10 14 16   Temp:   (!) 97.3 F (36.3 C)   TempSrc:   Oral   SpO2:   100%     Intake/Output Summary (Last 24 hours) at 04/30/2019 1844 Last data filed at 04/30/2019 1626 Gross per 24 hour  Intake 1922.4 ml  Output -  Net 1922.4 ml   There were no vitals filed for this visit. General: alert and oriented to time, place, and person. Appear in mild distress, affect appropriate Eyes: PERRL, Conjunctiva normal ENT: Oral Mucosa Clear, moist  Neck: no JVD, no Abnormal Mass Or lumps Cardiovascular: S1 and S2 Present, no Murmur,  Respiratory: good respiratory effort, Bilateral Air entry equal and Decreased, no signs of accessory muscle use, Clear to Auscultation, no Crackles, no wheezes Abdomen: Bowel Sound present, Soft and distended tenderness, no hernia Skin: no rashes  Extremities: bilateral  Pedal edema, no calf tenderness Neurologic: without  any new focal findings generalized weakness in lower extremities. Gait not checked due to patient safety concerns  Data Reviewed: I have personally reviewed and interpreted daily labs, tele strips, imagings as discussed above. I reviewed all nursing notes, pharmacy notes, vitals, pertinent old records I have discussed plan of care as described above with RN and patient/family.  CBC: Recent Labs  Lab 04/29/19 1546 04/29/19 1716 04/29/19 2359 04/30/19 0554  WBC 2.0*  2.0*  --  3.2* 5.9  NEUTROABS 1.3*  --  2.4 4.1  HGB 7.4*  7.4* 8.8* 6.4* 9.0*  HCT 24.7*  24.6* 26.0* 19.7* 28.3*  MCV 116.0*  116.0*  --  107.1* 105.2*  PLT 186  175  --  210 0000000   Basic Metabolic Panel: Recent Labs  Lab 04/29/19 1546 04/29/19 1650 04/29/19 1716 04/29/19 2359 04/30/19 0554  NA 141  --  142 135 138  K 4.2  --  3.6 3.6 3.8  CL 119*  --   --  113*  112*  CO2 10*  --   --  15* 13*  GLUCOSE 100*  --   --  191* 122*  BUN 13  --   --  12 12  CREATININE 1.55*  --   --  1.39* 1.54*  CALCIUM 7.7*  --   --  7.6* 7.7*  MG  --  1.8  --   --   --   PHOS  --  4.3  --   --   --     Liver Function Tests: Recent Labs  Lab 04/29/19 1650 04/29/19 2359 04/30/19 0554  AST 53* 36 39  ALT 32 26 26  ALKPHOS 213* 173* 162*  BILITOT 1.0 0.7 1.2  PROT 5.8* 4.7* 4.8*  ALBUMIN 2.4* 2.0* 2.0*   Recent Labs  Lab 04/29/19 1650  LIPASE 13   Recent Labs  Lab 04/29/19 1650  AMMONIA 38*   Coagulation Profile: No results for input(s): INR, PROTIME in the last 168 hours. Cardiac Enzymes: Recent Labs  Lab 04/29/19 2359  CKTOTAL 120   BNP (last 3 results) No results for input(s): PROBNP in the last 8760 hours. CBG: Recent Labs  Lab 04/30/19 1131 04/30/19 1215 04/30/19 1339 04/30/19 1716 04/30/19 1747  GLUCAP 75 97 62* 49* 161*   Studies: CT Chest W Contrast  Result Date: 04/30/2019 CLINICAL DATA:  Abdomen distension EXAM: CT CHEST, ABDOMEN, AND PELVIS WITH CONTRAST TECHNIQUE:  Multidetector CT imaging of the chest, abdomen and pelvis was performed following the standard protocol during bolus administration of intravenous contrast. CONTRAST:  76mL OMNIPAQUE IOHEXOL 300 MG/ML  SOLN COMPARISON:  CT 08/12/2017 FINDINGS: CT CHEST FINDINGS Cardiovascular: Nonaneurysmal aorta. Coronary vascular calcification. Normal heart size. No pericardial effusion. Mediastinum/Nodes: Midline trachea. No thyroid mass. No significant adenopathy. Esophagus unremarkable Lungs/Pleura: Punctate 1-2 mm nodules at the apices. No consolidation or pneumothorax. Small left-sided pleural effusion. Musculoskeletal: No chest wall mass or suspicious bone lesions identified. CT ABDOMEN PELVIS FINDINGS Hepatobiliary: Hepatic steatosis. Distended gallbladder with increased density probably sludge. No calcified stone. Stable enlargement of the extrahepatic common bile duct, measuring up to 9 mm. Pancreas: Atrophic.  No inflammatory changes Spleen: Normal in size without focal abnormality. Adrenals/Urinary Tract: Adrenal glands are normal. No hydronephrosis. Subcentimeter hypodensity within the mid to lower left kidney too small to further characterize. Distended urinary bladder. Stomach/Bowel: Status post gastric bypass. Anterior extension of the gastric body with linear scarring in the abdominal wall presumably due to prior gastrostomy tube. Portion of the gastric wall extends into the old tube tract. Negative for bowel obstruction or bowel wall thickening. Negative appendix. Vascular/Lymphatic: Nonaneurysmal aorta.  No significant adenopathy Reproductive: Uterus and bilateral adnexa are unremarkable. Other: No free air.  Small amount of abdominopelvic ascites. Musculoskeletal: No acute or significant osseous findings. IMPRESSION: 1. Trace left-sided pleural effusion.  No acute airspace disease. 2. Punctate nodules at the apices. No follow-up needed if patient is low-risk (and has no known or suspected primary neoplasm).  Non-contrast chest CT can be considered in 12 months if patient is high-risk. This recommendation follows the consensus statement: Guidelines for Management of Incidental Pulmonary Nodules Detected on CT Images: From the Fleischner Society 2017; Radiology 2017; 284:228-243. 3. Dilated gallbladder with increased intraluminal density, possible sludge. No inflammatory changes by CT. Stable enlargement of the extrahepatic common bile duct. 4. Hepatic steatosis. 5. Small amount of ascites within the abdomen and pelvis 6. 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 Electronically Signed   By: Madie Reno.D.  On: 04/30/2019 01:56   MR THORACIC SPINE WO CONTRAST  Result Date: 04/30/2019 CLINICAL DATA:  Increasing weakness over the past 3 weeks with edema in both lower extremities and difficulty walking. No known injury. EXAM: MRI THORACIC SPINE WITHOUT CONTRAST TECHNIQUE: Multiplanar, multisequence MR imaging of the thoracic spine was performed. No intravenous contrast was administered. COMPARISON:  Plain films thoracic spine 07/20/2014. FINDINGS: Alignment:  Normal. Vertebrae: No fracture, evidence of discitis, or bone lesion. Cord:  Normal signal and morphology. Paraspinal and other soft tissues: Small bilateral pleural effusions are seen. Disc levels: The central spinal canal and neural foramina are widely patent at all levels. Minimal disc bulging at T9-10 is noted. IMPRESSION: Normal appearing thoracic spine and spinal cord. Negative for stenosis. Small bilateral pleural effusions. Electronically Signed   By: Inge Rise M.D.   On: 04/30/2019 08:49   MR LUMBAR SPINE WO CONTRAST  Result Date: 04/30/2019 CLINICAL DATA:  Increasing weakness over the past 3 weeks with bilateral lower extremity edema and difficulty walking. EXAM: MRI LUMBAR SPINE WITHOUT CONTRAST TECHNIQUE: Multiplanar, multisequence MR imaging of the lumbar spine was performed. No intravenous contrast was administered.  COMPARISON:  None. FINDINGS: Segmentation:  Standard. Alignment:  Normal. Vertebrae:  No fracture, evidence of discitis, or bone lesion. Conus medullaris and cauda equina: Conus extends to the L2 level. Conus and cauda equina appear normal. Paraspinal and other soft tissues: Negative. Disc levels: The central spinal canal and neural foramina are widely patent at all levels. Very shallow disc bulge is seen at L4-5. Mild-to-moderate facet degenerative disease is seen at L4-5 and L5-S1. IMPRESSION: Mild lower lumbar spondylosis without central canal or foraminal narrowing. No finding to explain the patient's symptoms. Electronically Signed   By: Inge Rise M.D.   On: 04/30/2019 08:51   CT ABDOMEN PELVIS W CONTRAST  Result Date: 04/30/2019 CLINICAL DATA:  Abdomen distension EXAM: CT CHEST, ABDOMEN, AND PELVIS WITH CONTRAST TECHNIQUE: Multidetector CT imaging of the chest, abdomen and pelvis was performed following the standard protocol during bolus administration of intravenous contrast. CONTRAST:  52mL OMNIPAQUE IOHEXOL 300 MG/ML  SOLN COMPARISON:  CT 08/12/2017 FINDINGS: CT CHEST FINDINGS Cardiovascular: Nonaneurysmal aorta. Coronary vascular calcification. Normal heart size. No pericardial effusion. Mediastinum/Nodes: Midline trachea. No thyroid mass. No significant adenopathy. Esophagus unremarkable Lungs/Pleura: Punctate 1-2 mm nodules at the apices. No consolidation or pneumothorax. Small left-sided pleural effusion. Musculoskeletal: No chest wall mass or suspicious bone lesions identified. CT ABDOMEN PELVIS FINDINGS Hepatobiliary: Hepatic steatosis. Distended gallbladder with increased density probably sludge. No calcified stone. Stable enlargement of the extrahepatic common bile duct, measuring up to 9 mm. Pancreas: Atrophic.  No inflammatory changes Spleen: Normal in size without focal abnormality. Adrenals/Urinary Tract: Adrenal glands are normal. No hydronephrosis. Subcentimeter hypodensity within  the mid to lower left kidney too small to further characterize. Distended urinary bladder. Stomach/Bowel: Status post gastric bypass. Anterior extension of the gastric body with linear scarring in the abdominal wall presumably due to prior gastrostomy tube. Portion of the gastric wall extends into the old tube tract. Negative for bowel obstruction or bowel wall thickening. Negative appendix. Vascular/Lymphatic: Nonaneurysmal aorta.  No significant adenopathy Reproductive: Uterus and bilateral adnexa are unremarkable. Other: No free air.  Small amount of abdominopelvic ascites. Musculoskeletal: No acute or significant osseous findings. IMPRESSION: 1. Trace left-sided pleural effusion.  No acute airspace disease. 2. Punctate nodules at the apices. No follow-up needed if patient is low-risk (and has no known or suspected primary neoplasm). Non-contrast chest CT can be  considered in 12 months if patient is high-risk. This recommendation follows the consensus statement: Guidelines for Management of Incidental Pulmonary Nodules Detected on CT Images: From the Fleischner Society 2017; Radiology 2017; 284:228-243. 3. Dilated gallbladder with increased intraluminal density, possible sludge. No inflammatory changes by CT. Stable enlargement of the extrahepatic common bile duct. 4. Hepatic steatosis. 5. Small amount of ascites within the abdomen and pelvis 6. 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 Electronically Signed   By: Donavan Foil M.D.   On: 04/30/2019 01:56   ECHOCARDIOGRAM COMPLETE  Result Date: 04/30/2019   ECHOCARDIOGRAM REPORT   Patient Name:   WAVEL LOKKEN [place] Date of Exam: 04/30/2019 Medical Rec #:  [id number]       Height:       60.0 in Accession #:    [id number]      Weight:       127.4 lb Date of Birth:  [birth year]       BSA:          1.54 m Patient Age:    54 years        BP:           101/73 mmHg Patient Gender: F               HR:           82 bpm. Exam Location:  Inpatient Procedure:  2D Echo Indications:    Congenital Heart Disease Q24.0  History:        Patient has prior history of Echocardiogram examinations, most                 recent 04/23/2016. Risk Factors:Former Smoker. ETOH abuse.  Sonographer:    Clayton Lefort RDCS (AE) Referring Phys: [place]  1. Left ventricular ejection fraction, by visual estimation, is 50 to 55%. The left ventricle has normal function. There is no left ventricular hypertrophy.  2. The left ventricle has no regional wall motion abnormalities.  3. Global right ventricle has normal systolic function.The right ventricular size is normal. No increase in right ventricular wall thickness.  4. Left atrial size was normal.  5. Right atrial size was normal.  6. The mitral valve is normal in structure. No evidence of mitral valve regurgitation.  7. The tricuspid valve is normal in structure.  8. The aortic valve is normal in structure. Aortic valve regurgitation is not visualized.  9. The pulmonic valve was normal in structure. Pulmonic valve regurgitation is not visualized. 10. Normal pulmonary artery systolic pressure. 11. The atrial septum is grossly normal. FINDINGS  Left Ventricle: Left ventricular ejection fraction, by visual estimation, is 50 to 55%. The left ventricle has normal function. The left ventricle has no regional wall motion abnormalities. There is no left ventricular hypertrophy. Right Ventricle: The right ventricular size is normal. No increase in right ventricular wall thickness. Global RV systolic function is has normal systolic function. The tricuspid regurgitant velocity is 1.91 m/s, and with an assumed right atrial pressure  of 3 mmHg, the estimated right ventricular systolic pressure is normal at 17.5 mmHg. Left Atrium: Left atrial size was normal in size. Right Atrium: Right atrial size was normal in size Pericardium: There is no evidence of pericardial effusion. Mitral Valve: The mitral valve is normal in structure. No  evidence of mitral valve regurgitation. MV peak gradient, 2.0 mmHg. Tricuspid Valve: The tricuspid valve is normal in structure. Tricuspid valve regurgitation is trivial. Aortic Valve: The aortic  valve is normal in structure. Aortic valve regurgitation is not visualized. Aortic valve mean gradient measures 2.0 mmHg. Aortic valve peak gradient measures 4.2 mmHg. Aortic valve area, by VTI measures 2.45 cm. Pulmonic Valve: The pulmonic valve was normal in structure. Pulmonic valve regurgitation is not visualized. Pulmonic regurgitation is not visualized. Aorta: The aortic root and ascending aorta are structurally normal, with no evidence of dilitation. IAS/Shunts: The atrial septum is grossly normal.  LEFT VENTRICLE PLAX 2D LVIDd:         3.70 cm  Diastology LVIDs:         2.50 cm  LV e' lateral:   10.00 cm/s LV PW:         0.90 cm  LV E/e' lateral: 5.6 LV IVS:        0.90 cm  LV e' medial:    7.29 cm/s LVOT diam:     1.90 cm  LV E/e' medial:  7.6 LV SV:         36 ml LV SV Index:   22.70 LVOT Area:     2.84 cm  RIGHT VENTRICLE             IVC RV Basal diam:  3.00 cm     IVC diam: 1.40 cm RV S prime:     10.00 cm/s TAPSE (M-mode): 2.0 cm LEFT ATRIUM             Index       RIGHT ATRIUM           Index LA diam:        2.70 cm 1.75 cm/m  RA Area:     10.00 cm LA Vol (A2C):   32.9 ml 21.35 ml/m RA Volume:   20.20 ml  13.11 ml/m LA Vol (A4C):   26.8 ml 17.39 ml/m LA Biplane Vol: 30.7 ml 19.92 ml/m  AORTIC VALVE AV Area (Vmax):    2.27 cm AV Area (Vmean):   2.15 cm AV Area (VTI):     2.45 cm AV Vmax:           102.00 cm/s AV Vmean:          72.700 cm/s AV VTI:            0.178 m AV Peak Grad:      4.2 mmHg AV Mean Grad:      2.0 mmHg LVOT Vmax:         81.80 cm/s LVOT Vmean:        55.100 cm/s LVOT VTI:          0.154 m LVOT/AV VTI ratio: 0.87  AORTA Ao Root diam: 2.50 cm MITRAL VALVE                        TRICUSPID VALVE MV Area (PHT): 3.27 cm             TR Peak grad:   14.5 mmHg MV Peak grad:  2.0 mmHg              TR Vmax:        202.00 cm/s MV Mean grad:  1.0 mmHg MV Vmax:       0.71 m/s             SHUNTS MV Vmean:      45.8 cm/s            Systemic VTI:  0.15 m MV VTI:  0.18 m               Systemic Diam: 1.90 cm MV PHT:        67.28 msec MV Decel Time: 232 msec MV E velocity: 55.60 cm/s 103 cm/s MV A velocity: 53.70 cm/s 70.3 cm/s MV E/A ratio:  1.04       1.5  Mertie Moores MD Electronically signed by Mertie Moores MD Signature Date/Time: 04/30/2019/4:21:25 PM    Final    US Abdomen Limited RUQ  Result Date: 04/30/2019 CLINICAL DATA:  Nausea and vomiting EXAM: ULTRASOUND ABDOMEN LIMITED RIGHT UPPER QUADRANT COMPARISON:  CT abdomen and pelvis April 30, 2019 FINDINGS: Gallbladder: Gallbladder appears mildly distended, similar to CT examination with sludge within the gallbladder. Inspissated sludge is noted posteriorly. No gallstones are evident. No gallbladder wall thickening or pericholecystic fluid. No sonographic Murphy sign noted by sonographer. Common bile duct: Diameter: 8 mm, prominent. No biliary duct mass or calculus evident. There appears to be mild intrahepatic biliary duct dilatation. Liver: There is a focal cystic area with septation in the left lobe of the liver measuring 1.6 x 1.4 x 1.0 cm. Liver echogenicity is diffusely increased. Portal vein is patent on color Doppler imaging with normal direction of blood flow towards the liver. Other: There is mild ascites adjacent to the liver. The pyramids of the right kidney appear rather prominent. No obstructing focus in the right kidney. IMPRESSION: 1. Gallbladder distended with sludge present. No gallstones seen; small gallstones could be obscured by sludge. No gallbladder wall thickening or pericholecystic fluid. 2. Biliary duct dilatation. No mass or calculus is appreciable on this study in the biliary ductal system. From an imaging standpoint, MRCP would be the optimum imaging study of choice to further evaluate the biliary ductal system. 3.  Complex cystic lesion in the left lobe of the liver measuring 1.6 x 1.4 x 1.0 cm. Underlying hepatic steatosis. 4.  Slight ascites. 5. Prominence of renal pyramids, a finding of uncertain significance. No right renal lesions seen on CT examination performed earlier in the day. Electronically Signed   By: Lowella Grip III M.D.   On: 04/30/2019 09:35     Time spent: 35 minutes  Author: Berle Mull, MD [facility] 04/30/2019 6:44 PM  To reach On-call, see care teams to locate the attending and reach out to them via [email or web address]. If 7PM-7AM, please contact night-coverage If you still have difficulty reaching the attending provider, please [place] the [facility] (Director on Call) for [facility] on amion for assistance.

## 2019-04-30 NOTE — ED Notes (Signed)
Pt transported to MRI 

## 2019-04-30 NOTE — Progress Notes (Signed)
  Echocardiogram 2D Echocardiogram has been performed.  Angie [name] 04/30/2019, 3:17 PM

## 2019-04-30 NOTE — ED Notes (Signed)
CBG 28-- RN Vicente Males notified

## 2019-05-01 ENCOUNTER — Inpatient Hospital Stay (HOSPITAL_COMMUNITY): Payer: Medicare Other

## 2019-05-01 DIAGNOSIS — R634 Abnormal weight loss: Secondary | ICD-10-CM

## 2019-05-01 DIAGNOSIS — E162 Hypoglycemia, unspecified: Secondary | ICD-10-CM

## 2019-05-01 DIAGNOSIS — R1310 Dysphagia, unspecified: Secondary | ICD-10-CM

## 2019-05-01 DIAGNOSIS — R112 Nausea with vomiting, unspecified: Secondary | ICD-10-CM

## 2019-05-01 DIAGNOSIS — K9189 Other postprocedural complications and disorders of digestive system: Secondary | ICD-10-CM

## 2019-05-01 DIAGNOSIS — T68XXXA Hypothermia, initial encounter: Secondary | ICD-10-CM

## 2019-05-01 DIAGNOSIS — K831 Obstruction of bile duct: Secondary | ICD-10-CM

## 2019-05-01 DIAGNOSIS — Z9884 Bariatric surgery status: Secondary | ICD-10-CM

## 2019-05-01 LAB — GLUCOSE, CAPILLARY
Glucose-Capillary: 165 mg/dL — ABNORMAL HIGH (ref 70–99)
Glucose-Capillary: 172 mg/dL — ABNORMAL HIGH (ref 70–99)
Glucose-Capillary: 180 mg/dL — ABNORMAL HIGH (ref 70–99)
Glucose-Capillary: 187 mg/dL — ABNORMAL HIGH (ref 70–99)
Glucose-Capillary: 196 mg/dL — ABNORMAL HIGH (ref 70–99)
Glucose-Capillary: 200 mg/dL — ABNORMAL HIGH (ref 70–99)
Glucose-Capillary: 210 mg/dL — ABNORMAL HIGH (ref 70–99)
Glucose-Capillary: 215 mg/dL — ABNORMAL HIGH (ref 70–99)
Glucose-Capillary: 216 mg/dL — ABNORMAL HIGH (ref 70–99)
Glucose-Capillary: 218 mg/dL — ABNORMAL HIGH (ref 70–99)
Glucose-Capillary: 228 mg/dL — ABNORMAL HIGH (ref 70–99)
Glucose-Capillary: 236 mg/dL — ABNORMAL HIGH (ref 70–99)
Glucose-Capillary: 238 mg/dL — ABNORMAL HIGH (ref 70–99)
Glucose-Capillary: 248 mg/dL — ABNORMAL HIGH (ref 70–99)
Glucose-Capillary: 260 mg/dL — ABNORMAL HIGH (ref 70–99)
Glucose-Capillary: 267 mg/dL — ABNORMAL HIGH (ref 70–99)
Glucose-Capillary: 274 mg/dL — ABNORMAL HIGH (ref 70–99)
Glucose-Capillary: 319 mg/dL — ABNORMAL HIGH (ref 70–99)
Glucose-Capillary: 326 mg/dL — ABNORMAL HIGH (ref 70–99)
Glucose-Capillary: 352 mg/dL — ABNORMAL HIGH (ref 70–99)
Glucose-Capillary: 393 mg/dL — ABNORMAL HIGH (ref 70–99)

## 2019-05-01 LAB — CBC WITH DIFFERENTIAL/PLATELET
Abs Immature Granulocytes: 0.01 10*3/uL (ref 0.00–0.07)
Basophils Absolute: 0 10*3/uL (ref 0.0–0.1)
Basophils Relative: 0 %
Eosinophils Absolute: 0 10*3/uL (ref 0.0–0.5)
Eosinophils Relative: 0 %
HCT: 30 % — ABNORMAL LOW (ref 36.0–46.0)
Hemoglobin: 10 g/dL — ABNORMAL LOW (ref 12.0–15.0)
Immature Granulocytes: 1 %
Lymphocytes Relative: 11 %
Lymphs Abs: 0.2 10*3/uL — ABNORMAL LOW (ref 0.7–4.0)
MCH: 33.2 pg (ref 26.0–34.0)
MCHC: 33.3 g/dL (ref 30.0–36.0)
MCV: 99.7 fL (ref 80.0–100.0)
Monocytes Absolute: 0.1 10*3/uL (ref 0.1–1.0)
Monocytes Relative: 3 %
Neutro Abs: 1.5 10*3/uL — ABNORMAL LOW (ref 1.7–7.7)
Neutrophils Relative %: 85 %
Platelets: 167 10*3/uL (ref 150–400)
RBC: 3.01 MIL/uL — ABNORMAL LOW (ref 3.87–5.11)
RDW: 18.9 % — ABNORMAL HIGH (ref 11.5–15.5)
WBC: 1.8 10*3/uL — ABNORMAL LOW (ref 4.0–10.5)
nRBC: 0 % (ref 0.0–0.2)

## 2019-05-01 LAB — TYPE AND SCREEN
ABO/RH(D): B POS
Antibody Screen: NEGATIVE
Unit division: 0

## 2019-05-01 LAB — COMPREHENSIVE METABOLIC PANEL
ALT: 29 U/L (ref 0–44)
AST: 31 U/L (ref 15–41)
Albumin: 2.1 g/dL — ABNORMAL LOW (ref 3.5–5.0)
Alkaline Phosphatase: 165 U/L — ABNORMAL HIGH (ref 38–126)
Anion gap: 10 (ref 5–15)
BUN: 12 mg/dL (ref 6–20)
CO2: 16 mmol/L — ABNORMAL LOW (ref 22–32)
Calcium: 7.8 mg/dL — ABNORMAL LOW (ref 8.9–10.3)
Chloride: 111 mmol/L (ref 98–111)
Creatinine, Ser: 1.28 mg/dL — ABNORMAL HIGH (ref 0.44–1.00)
GFR calc Af Amer: 56 mL/min — ABNORMAL LOW (ref >60)
GFR calc non Af Amer: 48 mL/min — ABNORMAL LOW (ref >60)
Glucose, Bld: 237 mg/dL — ABNORMAL HIGH (ref 70–99)
Potassium: 4.2 mmol/L (ref 3.5–5.1)
Sodium: 137 mmol/L (ref 135–145)
Total Bilirubin: 0.8 mg/dL (ref 0.3–1.2)
Total Protein: 5 g/dL — ABNORMAL LOW (ref 6.5–8.1)

## 2019-05-01 LAB — MAGNESIUM: Magnesium: 1.5 mg/dL — ABNORMAL LOW (ref 1.7–2.4)

## 2019-05-01 LAB — BPAM RBC
Blood Product Expiration Date: 202101302359
ISSUE DATE / TIME: 202101040120
Unit Type and Rh: 7300

## 2019-05-01 LAB — PROTIME-INR
INR: 1.4 — ABNORMAL HIGH (ref 0.8–1.2)
Prothrombin Time: 16.7 seconds — ABNORMAL HIGH (ref 11.4–15.2)

## 2019-05-01 LAB — ACTH STIMULATION, 3 TIME POINTS
Cortisol, 30 Min: 35.8 ug/dL
Cortisol, 60 Min: 34.4 ug/dL
Cortisol, Base: 37.4 ug/dL

## 2019-05-01 LAB — URINE CULTURE: Culture: NO GROWTH

## 2019-05-01 LAB — RPR: RPR Ser Ql: NONREACTIVE

## 2019-05-01 MED ORDER — FOLIC ACID 5 MG/ML IJ SOLN
1.0000 mg | Freq: Every day | INTRAMUSCULAR | Status: DC
  Administered 2019-05-01 – 2019-05-08 (×8): 1 mg via INTRAVENOUS
  Filled 2019-05-01 (×9): qty 0.2

## 2019-05-01 MED ORDER — ALPRAZOLAM 0.25 MG PO TABS
0.2500 mg | ORAL_TABLET | Freq: Three times a day (TID) | ORAL | Status: DC | PRN
  Administered 2019-05-03 – 2019-05-10 (×11): 0.25 mg via ORAL
  Filled 2019-05-01 (×11): qty 1

## 2019-05-01 MED ORDER — PRO-STAT SUGAR FREE PO LIQD
30.0000 mL | Freq: Two times a day (BID) | ORAL | Status: DC
  Administered 2019-05-01 – 2019-05-03 (×6): 30 mL via ORAL
  Filled 2019-05-01 (×6): qty 30

## 2019-05-01 MED ORDER — GADOBUTROL 1 MMOL/ML IV SOLN
5.4000 mL | Freq: Once | INTRAVENOUS | Status: AC | PRN
  Administered 2019-05-01: 5.4 mL via INTRAVENOUS

## 2019-05-01 MED ORDER — OXYCODONE HCL 5 MG PO TABS
5.0000 mg | ORAL_TABLET | Freq: Four times a day (QID) | ORAL | Status: DC
  Administered 2019-05-01 – 2019-05-03 (×6): 5 mg via ORAL
  Filled 2019-05-01 (×6): qty 1

## 2019-05-01 MED ORDER — MAGNESIUM SULFATE 2 GM/50ML IV SOLN
2.0000 g | Freq: Once | INTRAVENOUS | Status: AC
  Administered 2019-05-01: 2 g via INTRAVENOUS
  Filled 2019-05-01: qty 50

## 2019-05-01 MED ORDER — QUETIAPINE FUMARATE 50 MG PO TABS
50.0000 mg | ORAL_TABLET | Freq: Every day | ORAL | Status: DC
  Administered 2019-05-01 – 2019-05-10 (×10): 50 mg via ORAL
  Filled 2019-05-01 (×2): qty 1
  Filled 2019-05-01: qty 2
  Filled 2019-05-01 (×2): qty 1
  Filled 2019-05-01: qty 2
  Filled 2019-05-01 (×3): qty 1
  Filled 2019-05-01: qty 2

## 2019-05-01 MED ORDER — THIAMINE HCL 100 MG/ML IJ SOLN
100.0000 mg | Freq: Every day | INTRAMUSCULAR | Status: DC
  Administered 2019-05-01 – 2019-05-08 (×8): 100 mg via INTRAVENOUS
  Filled 2019-05-01 (×8): qty 2

## 2019-05-01 MED ORDER — BOOST / RESOURCE BREEZE PO LIQD CUSTOM
1.0000 | Freq: Three times a day (TID) | ORAL | Status: DC
  Administered 2019-05-01 – 2019-05-03 (×7): 1 via ORAL

## 2019-05-01 NOTE — Progress Notes (Signed)
   Vital Signs MEWS/VS Documentation      05/01/2019 0253 05/01/2019 0700 05/01/2019 0759 05/01/2019 0800   MEWS Score:  1  1  1  2    MEWS Score Color:  Green  Green  Green  Yellow   Resp:  --  16  --  12   BP:  --  (!) 129/103  --  --   Temp:  97.8 F (36.6 C)  --  --  --   Level of Consciousness:  --  --  --  Responds to Voice      Pt is sleeping but arouses with speech and light tapping. No s/s distress. Will continue to monitor.     Sayer Masini 05/01/2019,10:49 AM

## 2019-05-01 NOTE — Consult Note (Addendum)
[facility] Consult: 3:21 PM 05/01/2019  LOS: 1 day    Referring Provider: Dr Maren Beach  Primary Care Physician:  Harvie Junior, MD Primary Gastroenterologist:  Dr. [name]     Reason for Consultation:  Nausea vomitng.     HPI: Arij Lydy Golonka is a 54 y.o. female.  PMH Seizure.  Anxiety.  Depression.  Hypertension.  C-sections. Bariatric gastric bypass 2007. 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  Treated with TNA for malnutrition in 2018. Several upper endoscopies with dilations 03/2016 - 04/21/2017.  EGD 04/21/2017: Severe stenosis at [place] anastomosis dilated.  Normal esophagus and normal jejunum.   Chronic iron deficiency anemia secondary to gastric bypass.  History adverse reaction to IV iron. Followed by Dr. Gayleen Orem at [facility].  He was not planning to place a stent so long as dilations were able to keep the stricture open.  Last encounter with Dr. Okey Dupre was in 02/2017 EGD w balloon dilatation to 12- 15 mm at [place] anastomosis She has not seen Dr. Wilfrid Lund since her EGD 03/2017.  05/16/2017 lap revision of gastrojejunostomy.  Dr. Kieth Brightly 05/18/2017 upper GI.  No residual postoperative leak.  GJ anastomosis widely patent. 07/2016 laparoscopic gastrostomy tube placed in the gastric remnant, by Dr. Kieth Brightly.     Starting around March 2020 patient started having recurrent vomiting when she ate solid foods.  She has been doing fine with soft textured foods and liquids according to her longtime partner.  He has lost weight over the past several months. Patient's partner returned from church on Sunday and the patient was less responsive.  She had recently been having swelling in her feet, difficulty walking and falling at home.  EMS arrived and partner says  glucose was 23.  Hgb 6.4 >> 1 PRBC >> 10.  MCV 116.  WBC is low, 1.8 today. INR 1.4. BUN normal, creatinine 1.5 C/W AKI T bili 1.0.  Alkaline phosphatase 213.  AST/ALT 53/32.  Albumin 2.1.  LFTs are improving. Lipase 13. Vitamin B12 not deficient, measures 2423.  Folate is low at 4.7.  TIBC low but iron, iron sat and ferritin okay. 04/29/2019 head CT.  Unremarkable. 04/30/2019 CTAP with contrast: Trace left pleural effusion.  Punctate nodules at pulmonary apices.  Dilated GB with increased intraluminal density, possibly sludge.  No inflammatory changes in gallbladder.  Stable enlargement of CBD.  Hepatic steatosis.  Trace abdominopelvic ascites.  S/P gastric bypass, no evidence for obstruction. 04/30/2019 abdominal ultrasound.  Sludge in gallbladder which is distended.  8 mm prominent CBD.  1.6 x 1.4 x 1.0 cm complex cystic lesion in the left lobe of liver.  Slight ascites. 05/01/2019 MRI, MRCP: Distended gallbladder, filled with sludge.  No GB wall thickening or pericholecystic fluid.  7 mm CBD.  Severe hepatic steatosis.  Bilateral hydronephrosis and hydroureter with distended urinary bladder.  Diffusely echogenic liver.  Pleural effusions, trace ascites, anasarca. 2D echo 04/30/2019.  LVEF 50 to 55%.  No valvular issues.  Normal right ventricular systolic function  Patient's significant other of  9 years thinks her "esophagus is closing up again".  The postprandial nausea vomiting is similar to what she experienced when she developed stricture of the GJ anastomosis.  There is no documentation that the patient has ever had esophageal strictures.  Patient has had "refractory hypoglycemia" but random cortisol level, cosyntropin stim test did not confirm adrenal insufficiency.  She is receiving IV dextrose and sugars recently have been reading in the 200s. Patient herself tells me that it feels like the food is stopping up sometimes in region of her lower esophagus and sometimes in the stomach.      Past  Medical History:  Diagnosis Date  . Abnormal Pap smear   . Alcohol abuse   . Anemia    IDA  . Anxiety    Takes xanax  . Arthritis    left hip/knees, lower spine  . Bipolar disorder ([place])   . Blood transfusion without reported diagnosis last done 04-25-17  . Bulging lumbar disc   . Bursitis    left shoulder  . Chronic lower back pain   . Cut    right middle finger with knife small cut healing pt instructed to keep clean and dry no redness or drainage  . Depression   . Diverticulitis   . Fibromyalgia   . GERD (gastroesophageal reflux disease)   . Hypertension    none since weight loss  . Lactose intolerance in adult 2017  . Migraines    "weekly @ least" (04/26/2016)  . Seizures ([place])    one Dec. 2017 due to throat closing  . Type II diabetes mellitus ([place])    "before the gastric bypass" (04/26/2016) states she no longer has diabetes    Past Surgical History:  Procedure Laterality Date  . BALLOON DILATION N/A 05/27/2016   Procedure: BALLOON DILATION;  Surgeon: Doran Stabler, MD;  Location: [facility];  Service: Gastroenterology;  Laterality: N/A;  . BALLOON DILATION N/A 04/21/2017   Procedure: BALLOON DILATION;  Surgeon: Doran Stabler, MD;  Location: [place];  Service: Gastroenterology;  Laterality: N/A;  . CERVICAL CONE BIOPSY    . [place]; 1996; 1998; 2014  . CESAREAN SECTION WITH BILATERAL TUBAL LIGATION Bilateral 10/28/2012   Procedure: Repeat cesarean section with delivery of baby boy at 38. Apgars 8/9.  BILATERAL TUBAL LIGATION;  Surgeon: Florian Buff, MD;  Location: [facility];  Service: Obstetrics;  Laterality: Bilateral;  . DILATION AND CURETTAGE OF UTERUS    . ESOPHAGOGASTRODUODENOSCOPY N/A 05/27/2016   Procedure: ESOPHAGOGASTRODUODENOSCOPY (EGD);  Surgeon: Doran Stabler, MD;  Location: [facility];  Service: Gastroenterology;  Laterality: N/A;  . ESOPHAGOGASTRODUODENOSCOPY (EGD) WITH PROPOFOL N/A 04/23/2016   Procedure:  ESOPHAGOGASTRODUODENOSCOPY (EGD) WITH PROPOFOL;  Surgeon: Doran Stabler, MD;  Location: [place];  Service: Endoscopy;  Laterality: N/A;  . ESOPHAGOGASTRODUODENOSCOPY (EGD) WITH PROPOFOL N/A 01/03/2017   Procedure: ESOPHAGOGASTRODUODENOSCOPY (EGD) WITH PROPOFOL;  Surgeon: Doran Stabler, MD;  Location: [facility];  Service: Gastroenterology;  Laterality: N/A;  . ESOPHAGOGASTRODUODENOSCOPY (EGD) WITH PROPOFOL N/A 04/21/2017   Procedure: ESOPHAGOGASTRODUODENOSCOPY (EGD) WITH PROPOFOL;  Surgeon: Doran Stabler, MD;  Location: [place];  Service: Gastroenterology;  Laterality: N/A;  . GASTROSTOMY N/A 08/16/2016   Procedure: LAPAROSCOPIC INSERTION OF GASTROSTOMY TUBE IN REMNANT STOMACH;  Surgeon: Arta Bruce Kinsinger, MD;  Location: [facility];  Service: General;  Laterality: N/A;  . GASTROSTOMY N/A 05/16/2017   Procedure: Replacement of  GASTROSTOMY TUBE;  Surgeon: [name],  MD;  Location: [facility];  Service: General;  Laterality: N/A;  . LAPAROSCOPIC REVISION OF GASTROJEJUNOSTOMY Left 05/16/2017   Procedure: LAPAROSCOPIC REVISION OF GASTROJEJUNOSTOMY;  Surgeon: Kieth Brightly Arta Bruce, MD;  Location: [facility];  Service: General;  Laterality: Left;  . ROUX-EN-Y GASTRIC BYPASS  2007  . TUBAL LIGATION  2014    Prior to Admission medications   Medication Sig Start Date End Date Taking? Authorizing Provider  ALPRAZolam Duanne Moron) 1 MG tablet Take 1 tablet (1 mg total) by mouth at bedtime as needed for anxiety. Patient taking differently: Take 1 mg by mouth 3 (three) times daily.  08/27/16  Yes Lauree Chandler, NP  CARAFATE 1 GM/10ML suspension Take 10 mLs by mouth 3 (three) times daily before meals. 01/04/18  Yes [provider]  cyclobenzaprine (FLEXERIL) 10 MG tablet Take 10 mg by mouth 3 (three) times daily. 12/28/17  Yes [provider]  famotidine (PEPCID) 40 MG tablet Take 40 mg by mouth daily. 11/27/18  Yes [provider]  GAS RELIEF 20 MG/0.3ML drops  Take 1 mL by mouth every 6 (six) hours as needed for flatulence.  11/25/17  Yes [provider]  lidocaine (XYLOCAINE) 5 % ointment Apply 1 application topically 3 (three) times daily. Apply to Bilateral shoulders 05/11/16  Yes [provider]  lisinopril-hydrochlorothiazide (ZESTORETIC) 10-12.5 MG tablet Take 1 tablet by mouth daily. 04/16/19  Yes [provider]  Multiple Vitamins-Minerals (MULTIVITAMIN WITH IRON-MINERALS) liquid Take 5 mLs by mouth daily.   Yes [provider]  OLANZapine (ZYPREXA) 2.5 MG tablet Take 2.5 mg by mouth daily. 12/25/17  Yes [provider]  oxyCODONE (ROXICODONE) 15 MG immediate release tablet Take 15 mg by mouth every 6 (six) hours. 01/12/18  Yes [provider]  oxymetazoline (AFRIN) 0.05 % nasal spray Place 2 sprays into both nostrils 2 (two) times daily as needed for congestion.   Yes [provider]  QUEtiapine (SEROQUEL) 50 MG tablet Take 50 mg by mouth at bedtime. 04/06/19  Yes [provider]    Scheduled Meds: . enoxaparin (LOVENOX) injection  40 mg Subcutaneous Daily  . feeding supplement  1 Container Oral TID BM  . feeding supplement (PRO-STAT SUGAR FREE 64)  30 mL Oral BID  . folic acid  1 mg Intravenous Daily  . OLANZapine  2.5 mg Oral Daily  . oxyCODONE  5 mg Oral Q6H  . pantoprazole  40 mg Oral QHS  . QUEtiapine  50 mg Oral QHS  . thiamine injection  100 mg Intravenous Daily   Infusions: . ceFEPime (MAXIPIME) IV 2 g (05/01/19 1221)  . dextrose 50 mL/hr at 05/01/19 0156  . lactated ringers 50 mL/hr at 04/30/19 1143  . vancomycin 500 mg (05/01/19 0304)   PRN Meds: acetaminophen **OR** acetaminophen, ALPRAZolam, ondansetron **OR** ondansetron (ZOFRAN) IV   Allergies as of 04/29/2019 - Review Complete 04/29/2019  Allergen Reaction Noted  . Iron Anaphylaxis 04/24/2016    Family History  Problem Relation Age of Onset  . Diabetes Father   . Heart disease Father   .  Depression Maternal Grandmother   . Heart disease Maternal Grandfather   . Depression Paternal Grandmother   . Colon cancer Paternal Grandfather   . Pancreatic cancer Paternal Grandfather     Social History   Socioeconomic History  . Marital status: Widowed    Spouse name: Not on file  . Number of children: 4  . Years of education: Not on file  . Highest education level:  Not on file  Occupational History  . Not on file  Tobacco Use  . Smoking status: Former Smoker    Packs/day: 0.50    Years: 4.00    Pack years: 2.00    Types: Cigarettes    Quit date: 03/16/2012    Years since quitting: 7.1  . Smokeless tobacco: Never Used  Substance and Sexual Activity  . Alcohol use: Yes    Alcohol/week: 17.0 standard drinks    Types: 6 Glasses of wine, 11 Shots of liquor per week    Comment: alcohol tx none x 1 year  . Drug use: No  . Sexual activity: Yes    Birth control/protection: None  Other Topics Concern  . Not on file  Social History Narrative  . Not on file   Social Determinants of Health   Financial Resource Strain:   . Difficulty of Paying Living Expenses: Not on file  Food Insecurity:   . Worried About Charity fundraiser in the Last Year: Not on file  . Ran Out of Food in the Last Year: Not on file  Transportation Needs:   . Lack of Transportation (Medical): Not on file  . Lack of Transportation (Non-Medical): Not on file  Physical Activity:   . Days of Exercise per Week: Not on file  . Minutes of Exercise per Session: Not on file  Stress:   . Feeling of Stress : Not on file  Social Connections:   . Frequency of Communication with Friends and Family: Not on file  . Frequency of Social Gatherings with Friends and Family: Not on file  . Attends Religious Services: Not on file  . Active Member of Clubs or Organizations: Not on file  . Attends Archivist Meetings: Not on file  . Marital Status: Not on file  Intimate Partner Violence:   . Fear of  Current or Ex-Partner: Not on file  . Emotionally Abused: Not on file  . Physically Abused: Not on file  . Sexually Abused: Not on file    REVIEW OF SYSTEMS: Constitutional: Weakness, falling at home.  Failure to thrive ENT:  No nose bleeds Pulm: No shortness of breath.  No cough CV:  No palpitations, no LE edema.  GU:  No hematuria, no frequency GI: See HPI Heme: No reports of unusual bleeding or bruising. Transfusions: Required blood transfusions in 2014, 01/2016, 04/2017. Neuro:  No headaches, no peripheral tingling or numbness Derm:  No itching, no rash or sores.  Endocrine:  No sweats or chills.  No polyuria or dysuria Immunization: Viewed. Travel:  None beyond local counties in last few months.    PHYSICAL EXAM: Vital signs in last 24 hours: Vitals:   05/01/19 1103 05/01/19 1152  BP:    Pulse: 72 68  Resp: 12   Temp:    SpO2: 100%    Wt Readings from Last 3 Encounters:  05/01/19 54.5 kg  05/25/17 57.8 kg  05/13/17 48.1 kg    General: Hectic, malnourished, fatigued, ill looking Head: No facial asymmetry or swelling.  No signs of head trauma.  Temporal wasting. Eyes: No scleral icterus.  No conjunctival pallor.  EOMI. Ears: Not hard of hearing Nose: No congestion or discharge Mouth: Edentulous.  Mucosa moist, pink, clear.  Tongue midline Neck: No JVD, no masses, no thyromegaly. Lungs: Clear bilaterally.  No labored breathing or cough Heart: RRR.  No MRG.  S1, S2 present Abdomen: Thin, no masses.  Active bowel sounds.  Not tender, not  distended.  Well-healed surgical scars..   Rectal: Deferred Musc/Skeltl: No joint swelling or gross deformity. Extremities: Left greater than right pedal edema, 2+ pitting. Neurologic: Slow speech.  Delayed but appropriate responses to questions.  Oriented to place, self.  No tremors, moves all 4 limbs, strength not tested. Skin: Pale. Nodes: No cervical adenopathy Psych: Cooperative, pleasant, laconic speech  pattern  Intake/Output from previous day: 01/04 0701 - 01/05 0700 In: 1293.8 [I.V.:1193.8; IV Piggyback:100] Out: -  Intake/Output this shift: Total I/O In: -  Out: 250 [Urine:250]  LAB RESULTS: Recent Labs    04/29/19 2359 04/30/19 0554 05/01/19 0949  WBC 3.2* 5.9 1.8*  HGB 6.4* 9.0* 10.0*  HCT 19.7* 28.3* 30.0*  PLT 210 179 167   BMET Lab Results  Component Value Date   NA 137 05/01/2019   NA 138 04/30/2019   NA 135 04/29/2019   K 4.2 05/01/2019   K 3.8 04/30/2019   K 3.6 04/29/2019   CL 111 05/01/2019   CL 112 (H) 04/30/2019   CL 113 (H) 04/29/2019   CO2 16 (L) 05/01/2019   CO2 13 (L) 04/30/2019   CO2 15 (L) 04/29/2019   GLUCOSE 237 (H) 05/01/2019   GLUCOSE 122 (H) 04/30/2019   GLUCOSE 191 (H) 04/29/2019   BUN 12 05/01/2019   BUN 12 04/30/2019   BUN 12 04/29/2019   CREATININE 1.28 (H) 05/01/2019   CREATININE 1.54 (H) 04/30/2019   CREATININE 1.39 (H) 04/29/2019   CALCIUM 7.8 (L) 05/01/2019   CALCIUM 7.7 (L) 04/30/2019   CALCIUM 7.6 (L) 04/29/2019   LFT Recent Labs    04/29/19 1650 04/29/19 2359 04/30/19 0554 05/01/19 0804  PROT 5.8* 4.7* 4.8* 5.0*  ALBUMIN 2.4* 2.0* 2.0* 2.1*  AST 53* 36 39 31  ALT 32 26 26 29   ALKPHOS 213* 173* 162* 165*  BILITOT 1.0 0.7 1.2 0.8  BILIDIR 0.6*  --   --   --   IBILI 0.4  --   --   --    PT/INR Lab Results  Component Value Date   INR 1.4 (H) 05/01/2019   INR 1.00 04/25/2017   INR 0.89 08/12/2016   Hepatitis Panel No results for input(s): HEPBSAG, HCVAB, HEPAIGM, HEPBIGM in the last 72 hours. C-Diff No components found for: CDIFF Lipase     Component Value Date/Time   LIPASE 13 04/29/2019 1650    Drugs of Abuse     Component Value Date/Time   LABOPIA POSITIVE (A) 04/29/2019 1715   COCAINSCRNUR NONE DETECTED 04/29/2019 1715   LABBENZ POSITIVE (A) 04/29/2019 1715   AMPHETMU NONE DETECTED 04/29/2019 1715   THCU NONE DETECTED 04/29/2019 1715   LABBARB NONE DETECTED 04/29/2019 1715     RADIOLOGY  STUDIES: DG Pelvis 1-2 Views  Result Date: 04/29/2019 CLINICAL DATA:  Pain status post fall EXAM: PELVIS - 1-2 VIEW COMPARISON:  July 20, 2014 FINDINGS: There is no evidence of pelvic fracture or diastasis. No pelvic bone lesions are seen. IMPRESSION: Negative. Electronically Signed   By: Constance Holster M.D.   On: 04/29/2019 16:41   CT Head Wo Contrast  Result Date: 04/29/2019 CLINICAL DATA:  Patient unresponsive. Minor head trauma. EXAM: CT HEAD WITHOUT CONTRAST CT CERVICAL SPINE WITHOUT CONTRAST TECHNIQUE: Multidetector CT imaging of the head and cervical spine was performed following the standard protocol without intravenous contrast. Multiplanar CT image reconstructions of the cervical spine were also generated. COMPARISON:  CT head 04/22/2016. FINDINGS: CT HEAD FINDINGS Brain: No evidence of acute infarction, hemorrhage,  hydrocephalus, extra-axial collection or mass lesion/mass effect. Vascular: No hyperdense vessel or unexpected calcification. Skull: Normal. Negative for fracture or focal lesion. Sinuses/Orbits: No acute finding. Other: None. CT CERVICAL SPINE FINDINGS Alignment: Normal. Skull base and vertebrae: The vertebral body heights are well preserved. No fracture the Soft tissues and spinal canal: No prevertebral fluid or swelling. No visible canal hematoma. Disc levels:  Normal Upper chest: Unremarkable Other: None IMPRESSION: 1. No acute intracranial abnormalities. 2. No evidence for cervical spine fracture. Electronically Signed   By: Kerby Moors M.D.   On: 04/29/2019 18:12   CT Chest W Contrast  Result Date: 04/30/2019 CLINICAL DATA:  Abdomen distension EXAM: CT CHEST, ABDOMEN, AND PELVIS WITH CONTRAST TECHNIQUE: Multidetector CT imaging of the chest, abdomen and pelvis was performed following the standard protocol during bolus administration of intravenous contrast. CONTRAST:  21mL OMNIPAQUE IOHEXOL 300 MG/ML  SOLN COMPARISON:  CT 08/12/2017 FINDINGS: CT CHEST FINDINGS  Cardiovascular: Nonaneurysmal aorta. Coronary vascular calcification. Normal heart size. No pericardial effusion. Mediastinum/Nodes: Midline trachea. No thyroid mass. No significant adenopathy. Esophagus unremarkable Lungs/Pleura: Punctate 1-2 mm nodules at the apices. No consolidation or pneumothorax. Small left-sided pleural effusion. Musculoskeletal: No chest wall mass or suspicious bone lesions identified. CT ABDOMEN PELVIS FINDINGS Hepatobiliary: Hepatic steatosis. Distended gallbladder with increased density probably sludge. No calcified stone. Stable enlargement of the extrahepatic common bile duct, measuring up to 9 mm. Pancreas: Atrophic.  No inflammatory changes Spleen: Normal in size without focal abnormality. Adrenals/Urinary Tract: Adrenal glands are normal. No hydronephrosis. Subcentimeter hypodensity within the mid to lower left kidney too small to further characterize. Distended urinary bladder. Stomach/Bowel: Status post gastric bypass. Anterior extension of the gastric body with linear scarring in the abdominal wall presumably due to prior gastrostomy tube. Portion of the gastric wall extends into the old tube tract. Negative for bowel obstruction or bowel wall thickening. Negative appendix. Vascular/Lymphatic: Nonaneurysmal aorta.  No significant adenopathy Reproductive: Uterus and bilateral adnexa are unremarkable. Other: No free air.  Small amount of abdominopelvic ascites. Musculoskeletal: No acute or significant osseous findings. IMPRESSION: 1. Trace left-sided pleural effusion.  No acute airspace disease. 2. Punctate nodules at the apices. No follow-up needed if patient is low-risk (and has no known or suspected primary neoplasm). Non-contrast chest CT can be considered in 12 months if patient is high-risk. This recommendation follows the consensus statement: Guidelines for Management of Incidental Pulmonary Nodules Detected on CT Images: From the Fleischner Society 2017; Radiology 2017;  [id number]. 3. Dilated gallbladder with increased intraluminal density, possible sludge. No inflammatory changes by CT. Stable enlargement of the extrahepatic common bile duct. 4. Hepatic steatosis. 5. Small amount of ascites within the abdomen and pelvis 6. 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 Electronically Signed   By: Donavan Foil M.D.   On: 04/30/2019 01:56   CT Cervical Spine Wo Contrast  Result Date: 04/29/2019 CLINICAL DATA:  Patient unresponsive. Minor head trauma. EXAM: CT HEAD WITHOUT CONTRAST CT CERVICAL SPINE WITHOUT CONTRAST TECHNIQUE: Multidetector CT imaging of the head and cervical spine was performed following the standard protocol without intravenous contrast. Multiplanar CT image reconstructions of the cervical spine were also generated. COMPARISON:  CT head 04/22/2016. FINDINGS: CT HEAD FINDINGS Brain: No evidence of acute infarction, hemorrhage, hydrocephalus, extra-axial collection or mass lesion/mass effect. Vascular: No hyperdense vessel or unexpected calcification. Skull: Normal. Negative for fracture or focal lesion. Sinuses/Orbits: No acute finding. Other: None. CT CERVICAL SPINE FINDINGS Alignment: Normal. Skull base and vertebrae: The vertebral body heights  are well preserved. No fracture the Soft tissues and spinal canal: No prevertebral fluid or swelling. No visible canal hematoma. Disc levels:  Normal Upper chest: Unremarkable Other: None IMPRESSION: 1. No acute intracranial abnormalities. 2. No evidence for cervical spine fracture. Electronically Signed   By: Kerby Moors M.D.   On: 04/29/2019 18:12   MR THORACIC SPINE WO CONTRAST  Result Date: 04/30/2019 CLINICAL DATA:  Increasing weakness over the past 3 weeks with edema in both lower extremities and difficulty walking. No known injury. EXAM: MRI THORACIC SPINE WITHOUT CONTRAST TECHNIQUE: Multiplanar, multisequence MR imaging of the thoracic spine was performed. No intravenous contrast was  administered. COMPARISON:  Plain films thoracic spine 07/20/2014. FINDINGS: Alignment:  Normal. Vertebrae: No fracture, evidence of discitis, or bone lesion. Cord:  Normal signal and morphology. Paraspinal and other soft tissues: Small bilateral pleural effusions are seen. Disc levels: The central spinal canal and neural foramina are widely patent at all levels. Minimal disc bulging at T9-10 is noted. IMPRESSION: Normal appearing thoracic spine and spinal cord. Negative for stenosis. Small bilateral pleural effusions. Electronically Signed   By: Inge Rise M.D.   On: 04/30/2019 08:49   MR LUMBAR SPINE WO CONTRAST  Result Date: 04/30/2019 CLINICAL DATA:  Increasing weakness over the past 3 weeks with bilateral lower extremity edema and difficulty walking. EXAM: MRI LUMBAR SPINE WITHOUT CONTRAST TECHNIQUE: Multiplanar, multisequence MR imaging of the lumbar spine was performed. No intravenous contrast was administered. COMPARISON:  None. FINDINGS: Segmentation:  Standard. Alignment:  Normal. Vertebrae:  No fracture, evidence of discitis, or bone lesion. Conus medullaris and cauda equina: Conus extends to the L2 level. Conus and cauda equina appear normal. Paraspinal and other soft tissues: Negative. Disc levels: The central spinal canal and neural foramina are widely patent at all levels. Very shallow disc bulge is seen at L4-5. Mild-to-moderate facet degenerative disease is seen at L4-5 and L5-S1. IMPRESSION: Mild lower lumbar spondylosis without central canal or foraminal narrowing. No finding to explain the patient's symptoms. Electronically Signed   By: Inge Rise M.D.   On: 04/30/2019 08:51   CT ABDOMEN PELVIS W CONTRAST  Result Date: 04/30/2019 CLINICAL DATA:  Abdomen distension EXAM: CT CHEST, ABDOMEN, AND PELVIS WITH CONTRAST TECHNIQUE: Multidetector CT imaging of the chest, abdomen and pelvis was performed following the standard protocol during bolus administration of intravenous contrast.  CONTRAST:  31mL OMNIPAQUE IOHEXOL 300 MG/ML  SOLN COMPARISON:  CT 08/12/2017 FINDINGS: CT CHEST FINDINGS Cardiovascular: Nonaneurysmal aorta. Coronary vascular calcification. Normal heart size. No pericardial effusion. Mediastinum/Nodes: Midline trachea. No thyroid mass. No significant adenopathy. Esophagus unremarkable Lungs/Pleura: Punctate 1-2 mm nodules at the apices. No consolidation or pneumothorax. Small left-sided pleural effusion. Musculoskeletal: No chest wall mass or suspicious bone lesions identified. CT ABDOMEN PELVIS FINDINGS Hepatobiliary: Hepatic steatosis. Distended gallbladder with increased density probably sludge. No calcified stone. Stable enlargement of the extrahepatic common bile duct, measuring up to 9 mm. Pancreas: Atrophic.  No inflammatory changes Spleen: Normal in size without focal abnormality. Adrenals/Urinary Tract: Adrenal glands are normal. No hydronephrosis. Subcentimeter hypodensity within the mid to lower left kidney too small to further characterize. Distended urinary bladder. Stomach/Bowel: Status post gastric bypass. Anterior extension of the gastric body with linear scarring in the abdominal wall presumably due to prior gastrostomy tube. Portion of the gastric wall extends into the old tube tract. Negative for bowel obstruction or bowel wall thickening. Negative appendix. Vascular/Lymphatic: Nonaneurysmal aorta.  No significant adenopathy Reproductive: Uterus and bilateral adnexa are unremarkable. Other:  No free air.  Small amount of abdominopelvic ascites. Musculoskeletal: No acute or significant osseous findings. IMPRESSION: 1. Trace left-sided pleural effusion.  No acute airspace disease. 2. Punctate nodules at the apices. No follow-up needed if patient is low-risk (and has no known or suspected primary neoplasm). Non-contrast chest CT can be considered in 12 months if patient is high-risk. This recommendation follows the consensus statement: Guidelines for Management of  Incidental Pulmonary Nodules Detected on CT Images: From the Fleischner Society 2017; Radiology 2017; 284:228-243. 3. Dilated gallbladder with increased intraluminal density, possible sludge. No inflammatory changes by CT. Stable enlargement of the extrahepatic common bile duct. 4. Hepatic steatosis. 5. Small amount of ascites within the abdomen and pelvis 6. 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 Electronically Signed   By: Donavan Foil M.D.   On: 04/30/2019 01:56   MR 3D Recon At Scanner  Result Date: 05/01/2019 CLINICAL DATA:  Biliary colic, distended gallbladder with gall sludge by prior CT and ultrasound EXAM: MRI ABDOMEN WITHOUT AND WITH CONTRAST (INCLUDING MRCP) TECHNIQUE: Multiplanar multisequence MR imaging of the abdomen was performed both before and after the administration of intravenous contrast. Heavily T2-weighted images of the biliary and pancreatic ducts were obtained, and three-dimensional MRCP images were rendered by post processing. CONTRAST:  5.16mL GADAVIST GADOBUTROL 1 MMOL/ML IV SOLN COMPARISON:  None. FINDINGS: Lower chest: Trace bilateral pleural effusions. Hepatobiliary: No hepatic mass. Severe hepatic steatosis. The gallbladder is distended and sludge filled. No discrete calculi are appreciated. The common bile duct measures up to 7 mm in caliber distally to the ampulla, and there no significant intrahepatic biliary ductal dilatation. There is no calculus or other obstructing lesion appreciated within the biliary tract. Pancreas: No mass, inflammatory changes, or other parenchymal abnormality identified. Spleen:  Within normal limits in size and appearance. Adrenals/Urinary Tract: No masses identified. Moderate bilateral hydronephrosis and hydroureter of the included portions of the ureters with a distended urinary bladder seen in the included lower abdomen. Stomach/Bowel: Visualized portions within the abdomen are unremarkable. Vascular/Lymphatic: No  pathologically enlarged lymph nodes identified. No abdominal aortic aneurysm demonstrated. Other:  Anasarca. Trace ascites. Musculoskeletal: No suspicious bone lesions identified. IMPRESSION: 1. The gallbladder is distended and sludge filled. No discrete calculi are appreciated. The common bile duct measures up to 7 mm in caliber distally to the ampulla, and there no significant intrahepatic biliary ductal dilatation. There is no calculus or other obstructing lesion appreciated within the biliary tract. 2. There is no gallbladder wall thickening or pericholecystic fluid to suggest acute cholecystitis. 3. Severe hepatic steatosis. 4. Moderate bilateral hydronephrosis and hydroureter of the included portions of the ureters with distended urinary bladder seen in the included lower abdomen. Correlate for urinary retention. 5. Pleural effusions, trace ascites, and anasarca. Electronically Signed   By: Eddie Candle M.D.   On: 05/01/2019 08:28   DG Chest Portable 1 View  Result Date: 04/29/2019 CLINICAL DATA:  Fall. EXAM: PORTABLE CHEST 1 VIEW COMPARISON:  07/20/2014. FINDINGS: The heart size and mediastinal contours are within normal limits. Both lungs are clear. The visualized skeletal structures are unremarkable. IMPRESSION: No active disease. Electronically Signed   By: Kerby Moors M.D.   On: 04/29/2019 16:38   MR ABDOMEN MRCP W WO CONTAST  Result Date: 05/01/2019 CLINICAL DATA:  Biliary colic, distended gallbladder with gall sludge by prior CT and ultrasound EXAM: MRI ABDOMEN WITHOUT AND WITH CONTRAST (INCLUDING MRCP) TECHNIQUE: Multiplanar multisequence MR imaging of the abdomen was performed both before and after  the administration of intravenous contrast. Heavily T2-weighted images of the biliary and pancreatic ducts were obtained, and three-dimensional MRCP images were rendered by post processing. CONTRAST:  5.9mL GADAVIST GADOBUTROL 1 MMOL/ML IV SOLN COMPARISON:  None. FINDINGS: Lower chest: Trace  bilateral pleural effusions. Hepatobiliary: No hepatic mass. Severe hepatic steatosis. The gallbladder is distended and sludge filled. No discrete calculi are appreciated. The common bile duct measures up to 7 mm in caliber distally to the ampulla, and there no significant intrahepatic biliary ductal dilatation. There is no calculus or other obstructing lesion appreciated within the biliary tract. Pancreas: No mass, inflammatory changes, or other parenchymal abnormality identified. Spleen:  Within normal limits in size and appearance. Adrenals/Urinary Tract: No masses identified. Moderate bilateral hydronephrosis and hydroureter of the included portions of the ureters with a distended urinary bladder seen in the included lower abdomen. Stomach/Bowel: Visualized portions within the abdomen are unremarkable. Vascular/Lymphatic: No pathologically enlarged lymph nodes identified. No abdominal aortic aneurysm demonstrated. Other:  Anasarca. Trace ascites. Musculoskeletal: No suspicious bone lesions identified. IMPRESSION: 1. The gallbladder is distended and sludge filled. No discrete calculi are appreciated. The common bile duct measures up to 7 mm in caliber distally to the ampulla, and there no significant intrahepatic biliary ductal dilatation. There is no calculus or other obstructing lesion appreciated within the biliary tract. 2. There is no gallbladder wall thickening or pericholecystic fluid to suggest acute cholecystitis. 3. Severe hepatic steatosis. 4. Moderate bilateral hydronephrosis and hydroureter of the included portions of the ureters with distended urinary bladder seen in the included lower abdomen. Correlate for urinary retention. 5. Pleural effusions, trace ascites, and anasarca. Electronically Signed   By: Eddie Candle M.D.   On: 05/01/2019 08:28   ECHOCARDIOGRAM COMPLETE  Result Date: 04/30/2019   ECHOCARDIOGRAM REPORT   Patient Name:   JAYMI CRUMMIE Heaslip Date of Exam: 04/30/2019 Medical Rec #:   [id number]       Height:       60.0 in Accession #:    [id number]      Weight:       127.4 lb Date of Birth:  1965/08/07       BSA:          1.54 m Patient Age:    54 years        BP:           101/73 mmHg Patient Gender: F               HR:           82 bpm. 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 Risk Factors:Former Smoker. ETOH abuse.  Sonographer:    Clayton Lefort RDCS (AE) Referring Phys: [place]  1. Left ventricular ejection fraction, by visual estimation, is 50 to 55%. The left ventricle has normal function. There is no left ventricular hypertrophy.  2. The left ventricle has no regional wall motion abnormalities.  3. Global right ventricle has normal systolic function.The right ventricular size is normal. No increase in right ventricular wall thickness.  4. Left atrial size was normal.  5. Right atrial size was normal.  6. The mitral valve is normal in structure. No evidence of mitral valve regurgitation.  7. The  tricuspid valve is normal in structure.  8. The aortic valve is normal in structure. Aortic valve regurgitation is not visualized.  9. The pulmonic valve was normal in structure. Pulmonic valve regurgitation is not visualized. 10. Normal pulmonary artery systolic pressure. 11. The atrial septum is grossly normal. Electronically signed by Mertie Moores MD Signature Date/Time: 04/30/2019/4:21:25 PM    Final    US Abdomen Limited RUQ  Result Date: 04/30/2019 CLINICAL DATA:  Nausea and vomiting EXAM: ULTRASOUND ABDOMEN LIMITED RIGHT UPPER QUADRANT COMPARISON:  CT abdomen and pelvis April 30, 2019 FINDINGS: Gallbladder: Gallbladder appears mildly distended, similar to CT examination with sludge within the gallbladder. Inspissated sludge is noted posteriorly. No gallstones are evident. No gallbladder wall  thickening or pericholecystic fluid. No sonographic Murphy sign noted by sonographer. Common bile duct: Diameter: 8 mm, prominent. No biliary duct mass or calculus evident. There appears to be mild intrahepatic biliary duct dilatation. Liver: There is a focal cystic area with septation in the left lobe of the liver measuring 1.6 x 1.4 x 1.0 cm. Liver echogenicity is diffusely increased. Portal vein is patent on color Doppler imaging with normal direction of blood flow towards the liver. Other: There is mild ascites adjacent to the liver. The pyramids of the right kidney appear rather prominent. No obstructing focus in the right kidney. IMPRESSION: 1. Gallbladder distended with sludge present. No gallstones seen; small gallstones could be obscured by sludge. No gallbladder wall thickening or pericholecystic fluid. 2. Biliary duct dilatation. No mass or calculus is appreciable on this study in the biliary ductal system. From an imaging standpoint, MRCP would be the optimum imaging study of choice to further evaluate the biliary ductal system. 3. Complex cystic lesion in the left lobe of the liver measuring 1.6 x 1.4 x 1.0 cm. Underlying hepatic steatosis. 4.  Slight ascites. 5. Prominence of renal pyramids, a finding of uncertain significance. No right renal lesions seen on CT examination performed earlier in the day. Electronically Signed   By: Lowella Grip III M.D.   On: 04/30/2019 09:35     IMPRESSION:   *   Postprandial vomiting with possible dysphagia.   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  Underwent several dilations of the stricture and ultimately revision of GJ anastomosis in 04/2017.  *     Failure to thrive, weight loss.  Along with ascites seen on imaging, suspect protein calorie malnutrition.  Patient required supplemental feedings via G-tube in 2018. RD has placed patient on breeze, prostat, Magic cup  supplements.  *     Hypoglycemia.  Serum glucose improved with IV dextrose.  *     Macrocytic anemia. Hgb improved after 1 PRBC. Folate deficient, B12 levels above normal.  Suspect malabsorption as well as poor p.o. intake/malnutrition. FOBT negative. IV folic acid day 1.    *   Pancytopenia.  *    Hepatic steatosis with elevated LFTs.  Gallbladder sludge, prominent CBD and cystic lesion in left lower lobe liver.  *    Bilateral hydronephrosis, hydroureter.  *   Meets sepsis criteria.  ? Source.  VAnc and Zosyn in place.    *    CKD 3.     PLAN:     *   EGD tomorrow ~ 11AM w Dr Henrene Pastor.  Stopped Lovenox to allow for lower bleeding risk at EGD.     Azucena Freed  05/01/2019, 3:21 PM Phone [phone]

## 2019-05-01 NOTE — Progress Notes (Signed)
PROGRESS NOTE    Angie [name]  [id number] DOB: December 28, 1965 DOA: 04/29/2019 PCP: Harvie Junior, MD   Brief Narrative: 54 year old female with history of gastric bypass surgery prior to which she had a history of diabetes mellitus CKD stage III, chronic anemia, chronic malnutrition with history of esophageal strictures, bipolar disorder and chronic pain, chronic anemia was found unresponsive by patient's boyfriend when he returned back from work after 12-hour shift.  Patient is lying in recliner and was found to be in the 70s.  EMS was called and found her sugar was 23 was given glucagon and D50 and brought to the ER.  To admitting physician patient stated "that she has been feeling increasingly weak over the last 3 weeks with increasing peripheral edema of both lower extremity and difficulty ambulate with patient planning to see a neurologist because of the difficulty ambulating at no incontinence of urine or bowel or any decrease sensation of the extremities.  Patient did have a fall about a week ago when she tripped on a vanity in which fell on her."  In [id number] more alert awake but was hypothermic with temperature of 93 F.  Patient became again hypoglycemic and had to be started on D10.  Blood cultures procalcitonin levels cortisol level TSH was sent.  Patient had CT head and C-spine followed by CT chest and abdomen.  CT head and C-spine were unremarkable.  CT chest and abdomen was showing third spacing of fluid and hepatic steatosis.  Pulmonary nodule also seen.  Covid test negative.  Labs show creatinine 1.5 albumin of 2.4 hemoglobin 7.4 which further decreased to 6.4 WBC was 2.  Patient was started on D10 and admitted for further management of hypothermia hypoglycemia with elevated procalcitonin concerning for infection for which antibiotics were started  Subjective: See/examined this morning appears lethargic- woke up and abel to tall me her name and location->ill-looking, frail,  cachectic with temporal wasting. Reports you need to have a bowel movement. Abdomen is distended. deniess specific complaint. No fever overnight, temp this morning nine 97.8. Sugar has been running in 200-300  Assessment & Plan:   Refractory hypoglycemia: Unclear etiology, suspecting due to poor nutritional status.  Random cortisol on admission was 25.7 essentially ruling out adrenalinsufficiency (although she also underwent cosyntropin test).  C-peptide, proinsulin, insulin growth factor and random insulin process.  Blood sugar running in 200-300 on D10 at 10 mL/h and IV fluids.  He still has poor oral intake discussed nursing staff to stop IV dextrose once eating and sugars remain stable.  Not needing Decadron currently.This could be gastric bypass surgery related hypoglycemia although this represents in 2 to 3 years after the surgery and patient gastric bypass was performed in 2007. Patient had to have G-tube placement due to persistent malnutrition in the past.  I will request GI consultation for recommendations.  Recent Labs  Lab 05/01/19 0938 05/01/19 1036 05/01/19 1152 05/01/19 1315 05/01/19 1421  GLUCAP 200* 326* 210* 393* 216*  Obtained further history from her fiancee "As per her fiancee since march of last year she has been losing weight, he thinks her esophagus is closing up again. Had appointment with her surgeon this week. Has good appetite but not abel to swallow. She is very fatigued easily with minimal activity. Only eating soft food and soup. Only little bit of regular food. She started to have leg swelling recently. She had GJ revision in 02/2018. Await for GI input may need to involve her surgeon.  I suspect  her primary issue is difficulty with swallowing.  Hyponatremia:Likely from poor oral intake.Resolved.On RL.Encourage oral intake.  Metabolic acidosis with bicarb 10-->13-15, improving. On ivf Ringer's lactate 50 ml/hr  Hypothermia: Likely due to her severe  malnutrition/hypoglycemia.Rule out sepsis.  Pro Cal was elevated on admission 7.5.  Will trend procalcitonin.  Lactic acid normal.  Started on empiric vancomycin and cefepime.  COVID-19 negative blood culture negative from 1/3 so far.Will check procalcitonin Deescalate antibiotics if culture data unremarkable.  Urine admissions negative and CT chest and abdomen no significant finding.  MRCP pending for? biliary duct dilatation: ERCP reviewed no evidence of cholecystitis no CBD dilatation gallbladder with sludge and distended.  Severe hepatic steatosis: Noted in MRCP.  Unclear etiology question due to malnutrition.  Will consult GI for opinion.  Moderate bilateral hydronephrosis and hydroureter ordered and out cath x1 and cont as needed  with bladder scan.Watch for refeeding syndrome monitor magnesium potassium and phosphorus daily.  Macrocytic anemia:Vitamin B12 high, ferritin 247, iron 59, folate low at less than 4.7- will start folate supplementation.  Fecal occult blood was negative.  Patient received 1 unit PRBC for hemoglobin of 6.4 g.  Today 10 g.  Monitor. Recent Labs  Lab 04/29/19 1546 04/29/19 1716 04/29/19 2359 04/30/19 0554 05/01/19 0949  HGB 7.4*  7.4* 8.8* 6.4* 9.0* 10.0*  HCT 24.7*  24.6* 26.0* 19.7* 28.3* 30.0*   Leukopenia WBC 1.8K today, ANC 1500.  Monitor.?etiology  Bilateral lower extremity edema/anasarca/third spacing: Likely in the setting of poor nutrition and hypoalbuminemia  Severe Protein calorie malnutrition: Likely ongoing issues for some time.  Continue dietitian follow-up and augment nutrition with supplementation.  B1 level pending.  Generalized weakness/incontinence: Nonfocal on exam, extensive work-up with MRI T, L-spine CT head CT C-spine unremarkable.  Likely in the setting of hyperglycemia/failure to thrive  CKD stage III: Creatinine appears more or less stable stable, downtrending. Recent Labs  Lab 04/29/19 1546 04/29/19 2359 04/30/19 0554  05/01/19 0804  BUN 13 12 12 12   CREATININE 1.55* 1.39* 1.54* 1.28*   Hypomagnesemia 1.5 replete with IV magnesium sulfate.  S/P gastric bypass history.  Bipolar disorder/anxiety Seroquel and Xanax at home.  Given her lethargy cut down Xanax  To 0.25 mg from 1 mg and Seroquel to 50 from 100 mg.  History of chronic pain on oxycodone-will cut back oxycodone to 5 mg every 6 hours as needed given her lethargy.  Body mass index is 23.47 kg/m.   DVT prophylaxis:lovenox Code Status: full code Family Communication: plan of care discussed with patient at bedside. No Family at bedside. Spoke with Her Celesta Gentile over the phone.  Disposition Plan: Remains inpatient for further work-up for anasarca, hypoglycemia. DISPO: TBD  Consultants:GI. Procedures:  MRI ABD/MRCP 1. The gallbladder is distended and sludge filled. No discrete calculi are appreciated. The common bile duct measures up to 7 mm in caliber distally to the ampulla, and there no significant intrahepatic biliary ductal dilatation. There is no calculus or other obstructing lesion appreciated within the biliary tract. 2. There is no gallbladder wall thickening or pericholecystic fluid to suggest acute cholecystitis. 3. Severe hepatic steatosis. 4. Moderate bilateral hydronephrosis and hydroureter of the included portions of the ureters with distended urinary bladder seen in the included lower abdomen. Correlate for urinary retention. 5. Pleural effusions, trace ascites, and anasarca.  Mild thoracic spine without contrast: Normal appearing thoracic spine and spinal cord. Negative for stenosis. Small bilateral pleural effusions  MRI lumbar spine without contrast:  Mild lower lumbar spondylosis without  central canal or foraminal narrowing. No finding to explain the patient's symptoms  CT chest abdomen pelvis with contrast:Trace left-sided pleural effusion.  No acute airspace disease. 2. Punctate nodules at the apices. No follow-up  needed if patient is low-risk (and has no known or suspected primary neoplasm). Non-contrast chest CT can be considered in 12 months if patient is high-risk  TTE:. Left ventricular ejection fraction, by visual estimation, is 50 to 55%. The left ventricle has normal function. There is no left ventricular hypertrophy.  2. The left ventricle has no regional wall motion abnormalities.  3. Global right ventricle has normal systolic function.The right ventricular size is normal. No increase in right ventricular wall thickness.  4. Left atrial size was normal.  5. Right atrial size was normal.  6. The mitral valve is normal in structure. No evidence of mitral valve regurgitation.  7. The tricuspid valve is normal in structure.  8. The aortic valve is normal in structure. Aortic valve regurgitation is not visualized.  9. The pulmonic valve was normal in structure. Pulmonic valve regurgitation is not visualized. 10. Normal pulmonary artery systolic pressure. 11. The atrial septum is grossly normal  RUQ [place]: Gallbladder distended with sludge present. No gallstones seen; small gallstones could be obscured by sludge. No gallbladder wall thickening or pericholecystic fluid. 2. Biliary duct dilatation. No mass or calculus is appreciable on this study in the biliary ductal system. From an imaging standpoint, MRCP would be the optimum imaging study of choice to further evaluate the biliary ductal system. 3. Complex cystic lesion in the left lobe of the liver measuring 1.6 x 1.4 x 1.0 cm. Underlying hepatic steatosis. 4.  Slight ascites. 5. Prominence of renal pyramids, a finding of uncertain significance  Microbiology:  Antimicrobials: Anti-infectives (From admission, onward)   Start     Dose/Rate Route Frequency Ordered Stop   05/01/19 0300  vancomycin (VANCOREADY) IVPB 500 mg/100 mL     500 mg 100 mL/hr over 60 Minutes Intravenous Every 24 hours 04/30/19 0833     04/30/19 2200  vancomycin (VANCOREADY) IVPB 500 mg/100  mL  Status:  Discontinued     500 mg 100 mL/hr over 60 Minutes Intravenous Every 24 hours 04/30/19 0103 04/30/19 0833   04/30/19 1000  ceFEPIme (MAXIPIME) 2 g in sodium chloride 0.9 % 100 mL IVPB     2 g 200 mL/hr over 30 Minutes Intravenous Every 12 hours 04/30/19 0103     04/29/19 2300  vancomycin (VANCOCIN) IVPB 1000 mg/200 mL premix     1,000 mg 200 mL/hr over 60 Minutes Intravenous  Once 04/29/19 2247 04/30/19 0447   04/29/19 2300  ceFEPIme (MAXIPIME) 2 g in sodium chloride 0.9 % 100 mL IVPB     2 g 200 mL/hr over 30 Minutes Intravenous  Once 04/29/19 2247 04/30/19 0020       Objective: Vitals:   05/01/19 0800 05/01/19 1000 05/01/19 1103 05/01/19 1152  BP:      Pulse:   72 68  Resp: 12 10 12    Temp:      TempSrc:      SpO2:   100%   Weight:        Intake/Output Summary (Last 24 hours) at 05/01/2019 1457 Last data filed at 05/01/2019 1317 Gross per 24 hour  Intake 1193.75 ml  Output 250 ml  Net 943.75 ml   Filed Weights   05/01/19 0253  Weight: 54.5 kg   Weight change:   Body mass index is 23.47  kg/m.  Intake/Output from previous day: 01/04 0701 - 01/05 0700 In: 1293.8 [I.V.:1193.8; IV Piggyback:100] Out: -  Intake/Output this shift: Total I/O In: -  Out: 250 [Urine:250]  Examination:  General exam: AA oriented, easily frail older than his stated age temporal wasting,cachectic HEENT:Oral mucosa moist, Ear/Nose WNL grossly, dentition normal. Respiratory system: Bilateral diminished breath sounds clear no wheezing or crackles,no use of accessory muscle Cardiovascular system: S1 & S2 +,No JVD. Gastrointestinal system: Abdomen soft, NT, distended/ascites,BS+ Nervous System:Alert, awake, moving extremities and grossly nonfocal Extremities: Bilateral lower extremity edema, distal peripheral pulses palpable.  Skin: No rashes,no icterus. MSK: thin/wasted muscle bulk,tone, power  Medications:  Scheduled Meds: . enoxaparin (LOVENOX) injection  40 mg  Subcutaneous Daily  . feeding supplement  1 Container Oral TID BM  . feeding supplement (PRO-STAT SUGAR FREE 64)  30 mL Oral BID  . folic acid  1 mg Intravenous Daily  . OLANZapine  2.5 mg Oral Daily  . oxyCODONE  15 mg Oral Q6H  . pantoprazole  40 mg Oral QHS  . QUEtiapine  50 mg Oral QHS  . thiamine injection  100 mg Intravenous Daily   Continuous Infusions: . ceFEPime (MAXIPIME) IV 2 g (05/01/19 1221)  . dextrose 50 mL/hr at 05/01/19 0156  . lactated ringers 50 mL/hr at 04/30/19 1143  . vancomycin 500 mg (05/01/19 0304)    Data Reviewed: I have personally reviewed following labs and imaging studies  CBC: Recent Labs  Lab 04/29/19 1546 04/29/19 1716 04/29/19 2359 04/30/19 0554 05/01/19 0949  WBC 2.0*  2.0*  --  3.2* 5.9 1.8*  NEUTROABS 1.3*  --  2.4 4.1 1.5*  HGB 7.4*  7.4* 8.8* 6.4* 9.0* 10.0*  HCT 24.7*  24.6* 26.0* 19.7* 28.3* 30.0*  MCV 116.0*  116.0*  --  107.1* 105.2* 99.7  PLT 186  175  --  210 179 [id number]   Basic Metabolic Panel: Recent Labs  Lab 04/29/19 1546 04/29/19 1650 04/29/19 1716 04/29/19 2359 04/30/19 0554 05/01/19 0804  NA 141  --  142 135 138 137  K 4.2  --  3.6 3.6 3.8 4.2  CL 119*  --   --  113* 112* 111  CO2 10*  --   --  15* 13* 16*  GLUCOSE 100*  --   --  191* 122* 237*  BUN 13  --   --  12 12 12   CREATININE 1.55*  --   --  1.39* 1.54* 1.28*  CALCIUM 7.7*  --   --  7.6* 7.7* 7.8*  MG  --  1.8  --   --   --  1.5*  PHOS  --  4.3  --   --   --   --    GFR: CrCl cannot be calculated (Unknown ideal weight.). Liver Function Tests: Recent Labs  Lab 04/29/19 1650 04/29/19 2359 04/30/19 0554 05/01/19 0804  AST 53* 36 39 31  ALT 32 26 26 29   ALKPHOS 213* 173* 162* 165*  BILITOT 1.0 0.7 1.2 0.8  PROT 5.8* 4.7* 4.8* 5.0*  ALBUMIN 2.4* 2.0* 2.0* 2.1*   Recent Labs  Lab 04/29/19 1650  LIPASE 13   Recent Labs  Lab 04/29/19 1650  AMMONIA 38*   Coagulation Profile: Recent Labs  Lab 05/01/19 0949  INR 1.4*   Cardiac  Enzymes: Recent Labs  Lab 04/29/19 2359  CKTOTAL 120   BNP (last 3 results) No results for input(s): PROBNP in the last 8760 hours. HbA1C: Recent Labs  04/30/19 0554  HGBA1C 3.8*   CBG: Recent Labs  Lab 05/01/19 0938 05/01/19 1036 05/01/19 1152 05/01/19 1315 05/01/19 1421  GLUCAP 200* 326* 210* 393* 216*   Lipid Profile: Recent Labs    04/29/19 1650  TRIG 104   Thyroid Function Tests: Recent Labs    04/29/19 1650  TSH 1.623   Anemia Panel: Recent Labs    04/29/19 1650 04/29/19 2359 04/30/19 0554  VITAMINB12  --  2,423*  --   FOLATE  --  4.7*  --   FERRITIN 320*  --  247  TIBC  --  <70*  --   IRON  --  59  --   RETICCTPCT  --  4.1*  --    Sepsis Labs: Recent Labs  Lab 04/29/19 1650  PROCALCITON 7.56  LATICACIDVEN 1.0    Recent Results (from the past 240 hour(s))  Blood culture (routine x 2)     Status: None (Preliminary result)   Collection Time: 04/29/19  4:20 PM   Specimen: Right Antecubital; Blood  Result Value Ref Range Status   Specimen Description RIGHT ANTECUBITAL  Final   Special Requests   Final    BOTTLES DRAWN AEROBIC AND ANAEROBIC Blood Culture adequate volume   Culture   Final    NO GROWTH 2 DAYS Performed at [facility], [place] [address], [place], [place] [address]    Report Status PENDING  Incomplete  Culture, Urine     Status: None   Collection Time: 04/29/19  5:15 PM   Specimen: Urine, Random  Result Value Ref Range Status   Specimen Description URINE, RANDOM  Final   Special Requests NONE  Final   Culture   Final    NO GROWTH Performed at [facility], [place] [address] [place], [place] [address]    Report Status 05/01/2019 FINAL  Final  SARS CORONAVIRUS 2 (TAT 6-24 HRS) Nasopharyngeal Nasopharyngeal Swab     Status: None   Collection Time: 04/29/19  8:23 PM   Specimen: Nasopharyngeal Swab  Result Value Ref Range Status   SARS Coronavirus 2 NEGATIVE NEGATIVE Final    Comment: (NOTE) SARS-CoV-2 target  nucleic acids are NOT DETECTED. The SARS-CoV-2 RNA is generally detectable in upper and lower respiratory specimens during the acute phase of infection. Negative results do not preclude SARS-CoV-2 infection, do not rule out co-infections with other pathogens, and should not be used as the sole basis for treatment or other patient management decisions. Negative results must be combined with clinical observations, patient history, and epidemiological information. The expected result is Negative. Fact Sheet for Patients: SugarRoll.be Fact Sheet for Healthcare Providers: https://www.woods-mathews.com/ This test is not yet approved or cleared by the [place] FDA and  has been authorized for detection and/or diagnosis of SARS-CoV-2 by FDA under an Emergency Use Authorization (EUA). This EUA will remain  in effect (meaning this test can be used) for the duration of the COVID-19 declaration under Section 56 4(b)(1) of the Act, 21 U.S.C. section 360bbb-3(b)(1), unless the authorization is terminated or revoked sooner. Performed at [facility], [place] [address], [place], [place] [address]   Blood culture (routine x 2)     Status: None (Preliminary result)   Collection Time: 04/29/19 11:09 PM   Specimen: BLOOD  Result Value Ref Range Status   Specimen Description BLOOD LEFT ANTECUBITAL  Final   Special Requests   Final    BOTTLES DRAWN AEROBIC AND ANAEROBIC Blood Culture adequate volume  Culture   Final    NO GROWTH 1 DAY Performed at [facility], [place] [address], [place], [place] [address]    Report Status PENDING  Incomplete  MRSA PCR Screening     Status: None   Collection Time: 04/30/19  5:31 PM   Specimen: Nasopharyngeal  Result Value Ref Range Status   MRSA by PCR NEGATIVE NEGATIVE Final    Comment:        The GeneXpert MRSA Assay (FDA approved for NASAL specimens only), is one component of a comprehensive MRSA  colonization surveillance program. It is not intended to diagnose MRSA infection nor to guide or monitor treatment for MRSA infections. Performed at [facility], [place] [address] [place], [place] [address]       Radiology Studies: DG Pelvis 1-2 Views  Result Date: 04/29/2019 CLINICAL DATA:  Pain status post fall EXAM: PELVIS - 1-2 VIEW COMPARISON:  July 20, 2014 FINDINGS: There is no evidence of pelvic fracture or diastasis. No pelvic bone lesions are seen. IMPRESSION: Negative. Electronically Signed   By: Constance Holster M.D.   On: 04/29/2019 16:41   CT Head Wo Contrast  Result Date: 04/29/2019 CLINICAL DATA:  Patient unresponsive. Minor head trauma. EXAM: CT HEAD WITHOUT CONTRAST CT CERVICAL SPINE WITHOUT CONTRAST TECHNIQUE: Multidetector CT imaging of the head and cervical spine was performed following the standard protocol without intravenous contrast. Multiplanar CT image reconstructions of the cervical spine were also generated. COMPARISON:  CT head 04/22/2016. FINDINGS: CT HEAD FINDINGS Brain: No evidence of acute infarction, hemorrhage, hydrocephalus, extra-axial collection or mass lesion/mass effect. Vascular: No hyperdense vessel or unexpected calcification. Skull: Normal. Negative for fracture or focal lesion. Sinuses/Orbits: No acute finding. Other: None. CT CERVICAL SPINE FINDINGS Alignment: Normal. Skull base and vertebrae: The vertebral body heights are well preserved. No fracture the Soft tissues and spinal canal: No prevertebral fluid or swelling. No visible canal hematoma. Disc levels:  Normal Upper chest: Unremarkable Other: None IMPRESSION: 1. No acute intracranial abnormalities. 2. No evidence for cervical spine fracture. Electronically Signed   By: Kerby Moors M.D.   On: 04/29/2019 18:12   CT Chest W Contrast  Result Date: 04/30/2019 CLINICAL DATA:  Abdomen distension EXAM: CT CHEST, ABDOMEN, AND PELVIS WITH CONTRAST TECHNIQUE: Multidetector CT imaging of the  chest, abdomen and pelvis was performed following the standard protocol during bolus administration of intravenous contrast. CONTRAST:  56mL OMNIPAQUE IOHEXOL 300 MG/ML  SOLN COMPARISON:  CT 08/12/2017 FINDINGS: CT CHEST FINDINGS Cardiovascular: Nonaneurysmal aorta. Coronary vascular calcification. Normal heart size. No pericardial effusion. Mediastinum/Nodes: Midline trachea. No thyroid mass. No significant adenopathy. Esophagus unremarkable Lungs/Pleura: Punctate 1-2 mm nodules at the apices. No consolidation or pneumothorax. Small left-sided pleural effusion. Musculoskeletal: No chest wall mass or suspicious bone lesions identified. CT ABDOMEN PELVIS FINDINGS Hepatobiliary: Hepatic steatosis. Distended gallbladder with increased density probably sludge. No calcified stone. Stable enlargement of the extrahepatic common bile duct, measuring up to 9 mm. Pancreas: Atrophic.  No inflammatory changes Spleen: Normal in size without focal abnormality. Adrenals/Urinary Tract: Adrenal glands are normal. No hydronephrosis. Subcentimeter hypodensity within the mid to lower left kidney too small to further characterize. Distended urinary bladder. Stomach/Bowel: Status post gastric bypass. Anterior extension of the gastric body with linear scarring in the abdominal wall presumably due to prior gastrostomy tube. Portion of the gastric wall extends into the old tube tract. Negative for bowel obstruction or bowel wall thickening. Negative appendix. Vascular/Lymphatic: Nonaneurysmal aorta.  No significant adenopathy Reproductive:  Uterus and bilateral adnexa are unremarkable. Other: No free air.  Small amount of abdominopelvic ascites. Musculoskeletal: No acute or significant osseous findings. IMPRESSION: 1. Trace left-sided pleural effusion.  No acute airspace disease. 2. Punctate nodules at the apices. No follow-up needed if patient is low-risk (and has no known or suspected primary neoplasm). Non-contrast chest CT can be  considered in 12 months if patient is high-risk. This recommendation follows the consensus statement: Guidelines for Management of Incidental Pulmonary Nodules Detected on CT Images: From the Fleischner Society 2017; Radiology 2017; 284:228-243. 3. Dilated gallbladder with increased intraluminal density, possible sludge. No inflammatory changes by CT. Stable enlargement of the extrahepatic common bile duct. 4. Hepatic steatosis. 5. Small amount of ascites within the abdomen and pelvis 6. 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 Electronically Signed   By: Donavan Foil M.D.   On: 04/30/2019 01:56   CT Cervical Spine Wo Contrast  Result Date: 04/29/2019 CLINICAL DATA:  Patient unresponsive. Minor head trauma. EXAM: CT HEAD WITHOUT CONTRAST CT CERVICAL SPINE WITHOUT CONTRAST TECHNIQUE: Multidetector CT imaging of the head and cervical spine was performed following the standard protocol without intravenous contrast. Multiplanar CT image reconstructions of the cervical spine were also generated. COMPARISON:  CT head 04/22/2016. FINDINGS: CT HEAD FINDINGS Brain: No evidence of acute infarction, hemorrhage, hydrocephalus, extra-axial collection or mass lesion/mass effect. Vascular: No hyperdense vessel or unexpected calcification. Skull: Normal. Negative for fracture or focal lesion. Sinuses/Orbits: No acute finding. Other: None. CT CERVICAL SPINE FINDINGS Alignment: Normal. Skull base and vertebrae: The vertebral body heights are well preserved. No fracture the Soft tissues and spinal canal: No prevertebral fluid or swelling. No visible canal hematoma. Disc levels:  Normal Upper chest: Unremarkable Other: None IMPRESSION: 1. No acute intracranial abnormalities. 2. No evidence for cervical spine fracture. Electronically Signed   By: Kerby Moors M.D.   On: 04/29/2019 18:12   MR THORACIC SPINE WO CONTRAST  Result Date: 04/30/2019 CLINICAL DATA:  Increasing weakness over the past 3 weeks with  edema in both lower extremities and difficulty walking. No known injury. EXAM: MRI THORACIC SPINE WITHOUT CONTRAST TECHNIQUE: Multiplanar, multisequence MR imaging of the thoracic spine was performed. No intravenous contrast was administered. COMPARISON:  Plain films thoracic spine 07/20/2014. FINDINGS: Alignment:  Normal. Vertebrae: No fracture, evidence of discitis, or bone lesion. Cord:  Normal signal and morphology. Paraspinal and other soft tissues: Small bilateral pleural effusions are seen. Disc levels: The central spinal canal and neural foramina are widely patent at all levels. Minimal disc bulging at T9-10 is noted. IMPRESSION: Normal appearing thoracic spine and spinal cord. Negative for stenosis. Small bilateral pleural effusions. Electronically Signed   By: Inge Rise M.D.   On: 04/30/2019 08:49   MR LUMBAR SPINE WO CONTRAST  Result Date: 04/30/2019 CLINICAL DATA:  Increasing weakness over the past 3 weeks with bilateral lower extremity edema and difficulty walking. EXAM: MRI LUMBAR SPINE WITHOUT CONTRAST TECHNIQUE: Multiplanar, multisequence MR imaging of the lumbar spine was performed. No intravenous contrast was administered. COMPARISON:  None. FINDINGS: Segmentation:  Standard. Alignment:  Normal. Vertebrae:  No fracture, evidence of discitis, or bone lesion. Conus medullaris and cauda equina: Conus extends to the L2 level. Conus and cauda equina appear normal. Paraspinal and other soft tissues: Negative. Disc levels: The central spinal canal and neural foramina are widely patent at all levels. Very shallow disc bulge is seen at L4-5. Mild-to-moderate facet degenerative disease is seen at L4-5 and L5-S1. IMPRESSION: Mild lower lumbar  spondylosis without central canal or foraminal narrowing. No finding to explain the patient's symptoms. Electronically Signed   By: Inge Rise M.D.   On: 04/30/2019 08:51   CT ABDOMEN PELVIS W CONTRAST  Result Date: 04/30/2019 CLINICAL DATA:  Abdomen  distension EXAM: CT CHEST, ABDOMEN, AND PELVIS WITH CONTRAST TECHNIQUE: Multidetector CT imaging of the chest, abdomen and pelvis was performed following the standard protocol during bolus administration of intravenous contrast. CONTRAST:  78mL OMNIPAQUE IOHEXOL 300 MG/ML  SOLN COMPARISON:  CT 08/12/2017 FINDINGS: CT CHEST FINDINGS Cardiovascular: Nonaneurysmal aorta. Coronary vascular calcification. Normal heart size. No pericardial effusion. Mediastinum/Nodes: Midline trachea. No thyroid mass. No significant adenopathy. Esophagus unremarkable Lungs/Pleura: Punctate 1-2 mm nodules at the apices. No consolidation or pneumothorax. Small left-sided pleural effusion. Musculoskeletal: No chest wall mass or suspicious bone lesions identified. CT ABDOMEN PELVIS FINDINGS Hepatobiliary: Hepatic steatosis. Distended gallbladder with increased density probably sludge. No calcified stone. Stable enlargement of the extrahepatic common bile duct, measuring up to 9 mm. Pancreas: Atrophic.  No inflammatory changes Spleen: Normal in size without focal abnormality. Adrenals/Urinary Tract: Adrenal glands are normal. No hydronephrosis. Subcentimeter hypodensity within the mid to lower left kidney too small to further characterize. Distended urinary bladder. Stomach/Bowel: Status post gastric bypass. Anterior extension of the gastric body with linear scarring in the abdominal wall presumably due to prior gastrostomy tube. Portion of the gastric wall extends into the old tube tract. Negative for bowel obstruction or bowel wall thickening. Negative appendix. Vascular/Lymphatic: Nonaneurysmal aorta.  No significant adenopathy Reproductive: Uterus and bilateral adnexa are unremarkable. Other: No free air.  Small amount of abdominopelvic ascites. Musculoskeletal: No acute or significant osseous findings. IMPRESSION: 1. Trace left-sided pleural effusion.  No acute airspace disease. 2. Punctate nodules at the apices. No follow-up needed if  patient is low-risk (and has no known or suspected primary neoplasm). Non-contrast chest CT can be considered in 12 months if patient is high-risk. This recommendation follows the consensus statement: Guidelines for Management of Incidental Pulmonary Nodules Detected on CT Images: From the Fleischner Society 2017; Radiology 2017; 284:228-243. 3. Dilated gallbladder with increased intraluminal density, possible sludge. No inflammatory changes by CT. Stable enlargement of the extrahepatic common bile duct. 4. Hepatic steatosis. 5. Small amount of ascites within the abdomen and pelvis 6. 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 Electronically Signed   By: Donavan Foil M.D.   On: 04/30/2019 01:56   MR 3D Recon At Scanner  Result Date: 05/01/2019 CLINICAL DATA:  Biliary colic, distended gallbladder with gall sludge by prior CT and ultrasound EXAM: MRI ABDOMEN WITHOUT AND WITH CONTRAST (INCLUDING MRCP) TECHNIQUE: Multiplanar multisequence MR imaging of the abdomen was performed both before and after the administration of intravenous contrast. Heavily T2-weighted images of the biliary and pancreatic ducts were obtained, and three-dimensional MRCP images were rendered by post processing. CONTRAST:  5.3mL GADAVIST GADOBUTROL 1 MMOL/ML IV SOLN COMPARISON:  None. FINDINGS: Lower chest: Trace bilateral pleural effusions. Hepatobiliary: No hepatic mass. Severe hepatic steatosis. The gallbladder is distended and sludge filled. No discrete calculi are appreciated. The common bile duct measures up to 7 mm in caliber distally to the ampulla, and there no significant intrahepatic biliary ductal dilatation. There is no calculus or other obstructing lesion appreciated within the biliary tract. Pancreas: No mass, inflammatory changes, or other parenchymal abnormality identified. Spleen:  Within normal limits in size and appearance. Adrenals/Urinary Tract: No masses identified. Moderate bilateral hydronephrosis  and hydroureter of the included portions of the ureters with  a distended urinary bladder seen in the included lower abdomen. Stomach/Bowel: Visualized portions within the abdomen are unremarkable. Vascular/Lymphatic: No pathologically enlarged lymph nodes identified. No abdominal aortic aneurysm demonstrated. Other:  Anasarca. Trace ascites. Musculoskeletal: No suspicious bone lesions identified. IMPRESSION: 1. The gallbladder is distended and sludge filled. No discrete calculi are appreciated. The common bile duct measures up to 7 mm in caliber distally to the ampulla, and there no significant intrahepatic biliary ductal dilatation. There is no calculus or other obstructing lesion appreciated within the biliary tract. 2. There is no gallbladder wall thickening or pericholecystic fluid to suggest acute cholecystitis. 3. Severe hepatic steatosis. 4. Moderate bilateral hydronephrosis and hydroureter of the included portions of the ureters with distended urinary bladder seen in the included lower abdomen. Correlate for urinary retention. 5. Pleural effusions, trace ascites, and anasarca. Electronically Signed   By: Eddie Candle M.D.   On: 05/01/2019 08:28   DG Chest Portable 1 View  Result Date: 04/29/2019 CLINICAL DATA:  Fall. EXAM: PORTABLE CHEST 1 VIEW COMPARISON:  07/20/2014. FINDINGS: The heart size and mediastinal contours are within normal limits. Both lungs are clear. The visualized skeletal structures are unremarkable. IMPRESSION: No active disease. Electronically Signed   By: Kerby Moors M.D.   On: 04/29/2019 16:38   MR ABDOMEN MRCP W WO CONTAST  Result Date: 05/01/2019 CLINICAL DATA:  Biliary colic, distended gallbladder with gall sludge by prior CT and ultrasound EXAM: MRI ABDOMEN WITHOUT AND WITH CONTRAST (INCLUDING MRCP) TECHNIQUE: Multiplanar multisequence MR imaging of the abdomen was performed both before and after the administration of intravenous contrast. Heavily T2-weighted images of the  biliary and pancreatic ducts were obtained, and three-dimensional MRCP images were rendered by post processing. CONTRAST:  5.16mL GADAVIST GADOBUTROL 1 MMOL/ML IV SOLN COMPARISON:  None. FINDINGS: Lower chest: Trace bilateral pleural effusions. Hepatobiliary: No hepatic mass. Severe hepatic steatosis. The gallbladder is distended and sludge filled. No discrete calculi are appreciated. The common bile duct measures up to 7 mm in caliber distally to the ampulla, and there no significant intrahepatic biliary ductal dilatation. There is no calculus or other obstructing lesion appreciated within the biliary tract. Pancreas: No mass, inflammatory changes, or other parenchymal abnormality identified. Spleen:  Within normal limits in size and appearance. Adrenals/Urinary Tract: No masses identified. Moderate bilateral hydronephrosis and hydroureter of the included portions of the ureters with a distended urinary bladder seen in the included lower abdomen. Stomach/Bowel: Visualized portions within the abdomen are unremarkable. Vascular/Lymphatic: No pathologically enlarged lymph nodes identified. No abdominal aortic aneurysm demonstrated. Other:  Anasarca. Trace ascites. Musculoskeletal: No suspicious bone lesions identified. IMPRESSION: 1. The gallbladder is distended and sludge filled. No discrete calculi are appreciated. The common bile duct measures up to 7 mm in caliber distally to the ampulla, and there no significant intrahepatic biliary ductal dilatation. There is no calculus or other obstructing lesion appreciated within the biliary tract. 2. There is no gallbladder wall thickening or pericholecystic fluid to suggest acute cholecystitis. 3. Severe hepatic steatosis. 4. Moderate bilateral hydronephrosis and hydroureter of the included portions of the ureters with distended urinary bladder seen in the included lower abdomen. Correlate for urinary retention. 5. Pleural effusions, trace ascites, and anasarca.  Electronically Signed   By: Eddie Candle M.D.   On: 05/01/2019 08:28   ECHOCARDIOGRAM COMPLETE  Result Date: 04/30/2019   ECHOCARDIOGRAM REPORT   Patient Name:   CALEENA RUSHLOW Mccamy Date of Exam: 04/30/2019 Medical Rec #:  [id number]  Height:       60.0 in Accession #:    [id number]      Weight:       127.4 lb Date of Birth:  04-09-1966       BSA:          1.54 m Patient Age:    58 years        BP:           101/73 mmHg Patient Gender: F               HR:           82 bpm. 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 Risk Factors:Former Smoker. ETOH abuse.  Sonographer:    Clayton Lefort RDCS (AE) Referring Phys: [place]  1. Left ventricular ejection fraction, by visual estimation, is 50 to 55%. The left ventricle has normal function. There is no left ventricular hypertrophy.  2. The left ventricle has no regional wall motion abnormalities.  3. Global right ventricle has normal systolic function.The right ventricular size is normal. No increase in right ventricular wall thickness.  4. Left atrial size was normal.  5. Right atrial size was normal.  6. The mitral valve is normal in structure. No evidence of mitral valve regurgitation.  7. The tricuspid valve is normal in structure.  8. The aortic valve is normal in structure. Aortic valve regurgitation is not visualized.  9. The pulmonic valve was normal in structure. Pulmonic valve regurgitation is not visualized. 10. Normal pulmonary artery systolic pressure. 11. The atrial septum is grossly normal. FINDINGS  Left Ventricle: Left ventricular ejection fraction, by visual estimation, is 50 to 55%. The left ventricle has normal function. The left ventricle has no regional wall motion abnormalities. There is no left ventricular hypertrophy. Right Ventricle: The right ventricular size  is normal. No increase in right ventricular wall thickness. Global RV systolic function is has normal systolic function. The tricuspid regurgitant velocity is 1.91 m/s, and with an assumed right atrial pressure  of 3 mmHg, the estimated right ventricular systolic pressure is normal at 17.5 mmHg. Left Atrium: Left atrial size was normal in size. Right Atrium: Right atrial size was normal in size Pericardium: There is no evidence of pericardial effusion. Mitral Valve: The mitral valve is normal in structure. No evidence of mitral valve regurgitation. MV peak gradient, 2.0 mmHg. Tricuspid Valve: The tricuspid valve is normal in structure. Tricuspid valve regurgitation is trivial. Aortic Valve: The aortic valve is normal in structure. Aortic valve regurgitation is not visualized. Aortic valve mean gradient measures 2.0 mmHg. Aortic valve peak gradient measures 4.2 mmHg. Aortic valve area, by VTI measures 2.45 cm. Pulmonic Valve: The pulmonic valve was normal in structure. Pulmonic valve regurgitation is not visualized. Pulmonic regurgitation is not visualized. Aorta: The aortic root and ascending aorta are structurally normal, with no evidence of dilitation. IAS/Shunts: The atrial septum is grossly normal.  LEFT VENTRICLE PLAX 2D LVIDd:         3.70 cm  Diastology LVIDs:         2.50 cm  LV e' lateral:   10.00 cm/s LV PW:         0.90 cm  LV E/e' lateral:  5.6 LV IVS:        0.90 cm  LV e' medial:    7.29 cm/s LVOT diam:     1.90 cm  LV E/e' medial:  7.6 LV SV:         36 ml LV SV Index:   22.70 LVOT Area:     2.84 cm  RIGHT VENTRICLE             IVC RV Basal diam:  3.00 cm     IVC diam: 1.40 cm RV S prime:     10.00 cm/s TAPSE (M-mode): 2.0 cm LEFT ATRIUM             Index       RIGHT ATRIUM           Index LA diam:        2.70 cm 1.75 cm/m  RA Area:     10.00 cm LA Vol (A2C):   32.9 ml 21.35 ml/m RA Volume:   20.20 ml  13.11 ml/m LA Vol (A4C):   26.8 ml 17.39 ml/m LA Biplane Vol: 30.7 ml 19.92 ml/m  AORTIC  VALVE AV Area (Vmax):    2.27 cm AV Area (Vmean):   2.15 cm AV Area (VTI):     2.45 cm AV Vmax:           102.00 cm/s AV Vmean:          72.700 cm/s AV VTI:            0.178 m AV Peak Grad:      4.2 mmHg AV Mean Grad:      2.0 mmHg LVOT Vmax:         81.80 cm/s LVOT Vmean:        55.100 cm/s LVOT VTI:          0.154 m LVOT/AV VTI ratio: 0.87  AORTA Ao Root diam: 2.50 cm MITRAL VALVE                        TRICUSPID VALVE MV Area (PHT): 3.27 cm             TR Peak grad:   14.5 mmHg MV Peak grad:  2.0 mmHg             TR Vmax:        202.00 cm/s MV Mean grad:  1.0 mmHg MV Vmax:       0.71 m/s             SHUNTS MV Vmean:      45.8 cm/s            Systemic VTI:  0.15 m MV VTI:        0.18 m               Systemic Diam: 1.90 cm MV PHT:        67.28 msec MV Decel Time: 232 msec MV E velocity: 55.60 cm/s 103 cm/s MV A velocity: 53.70 cm/s 70.3 cm/s MV E/A ratio:  1.04       1.5  Mertie Moores MD Electronically signed by Mertie Moores MD Signature Date/Time: 04/30/2019/4:21:25 PM    Final    US Abdomen Limited RUQ  Result Date: 04/30/2019 CLINICAL DATA:  Nausea and vomiting EXAM: ULTRASOUND ABDOMEN LIMITED RIGHT UPPER QUADRANT COMPARISON:  CT abdomen and pelvis April 30, 2019 FINDINGS: Gallbladder: Gallbladder appears mildly distended, similar to CT examination with sludge within  the gallbladder. Inspissated sludge is noted posteriorly. No gallstones are evident. No gallbladder wall thickening or pericholecystic fluid. No sonographic Murphy sign noted by sonographer. Common bile duct: Diameter: 8 mm, prominent. No biliary duct mass or calculus evident. There appears to be mild intrahepatic biliary duct dilatation. Liver: There is a focal cystic area with septation in the left lobe of the liver measuring 1.6 x 1.4 x 1.0 cm. Liver echogenicity is diffusely increased. Portal vein is patent on color Doppler imaging with normal direction of blood flow towards the liver. Other: There is mild ascites adjacent to the liver.  The pyramids of the right kidney appear rather prominent. No obstructing focus in the right kidney. IMPRESSION: 1. Gallbladder distended with sludge present. No gallstones seen; small gallstones could be obscured by sludge. No gallbladder wall thickening or pericholecystic fluid. 2. Biliary duct dilatation. No mass or calculus is appreciable on this study in the biliary ductal system. From an imaging standpoint, MRCP would be the optimum imaging study of choice to further evaluate the biliary ductal system. 3. Complex cystic lesion in the left lobe of the liver measuring 1.6 x 1.4 x 1.0 cm. Underlying hepatic steatosis. 4.  Slight ascites. 5. Prominence of renal pyramids, a finding of uncertain significance. No right renal lesions seen on CT examination performed earlier in the day. Electronically Signed   By: Lowella Grip III M.D.   On: 04/30/2019 09:35      LOS: 1 day   Time spent: More than 50% of that time was spent in counseling and/or coordination of care.  Antonieta Pert, MD [facility]  05/01/2019, 2:57 PM

## 2019-05-01 NOTE — H&P (View-Only) (Signed)
[facility] Consult: 3:21 PM 05/01/2019  LOS: 1 day    Referring Provider: Dr Maren Beach  Primary Care Physician:  Harvie Junior, MD Primary Gastroenterologist:  Dr. [name]     Reason for Consultation:  Nausea vomitng.     HPI: Angie [name] is a 54 y.o. female.  PMH Seizure.  Anxiety.  Depression.  Hypertension.  C-sections. Bariatric gastric bypass 2007. 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  Treated with TNA for malnutrition in 2018. Several upper endoscopies with dilations 03/2016 - 04/21/2017.  EGD 04/21/2017: Severe stenosis at [place] anastomosis dilated.  Normal esophagus and normal jejunum.   Chronic iron deficiency anemia secondary to gastric bypass.  History adverse reaction to IV iron. Followed by Dr. Gayleen Orem at [facility].  He was not planning to place a stent so long as dilations were able to keep the stricture open.  Last encounter with Dr. Okey Dupre was in 02/2017 EGD w balloon dilatation to 12- 15 mm at [place] anastomosis She has not seen Dr. Wilfrid Lund since her EGD 03/2017.  05/16/2017 lap revision of gastrojejunostomy.  Dr. Kieth Brightly 05/18/2017 upper GI.  No residual postoperative leak.  GJ anastomosis widely patent. 07/2016 laparoscopic gastrostomy tube placed in the gastric remnant, by Dr. Kieth Brightly.     Starting around March 2020 patient started having recurrent vomiting when she ate solid foods.  She has been doing fine with soft textured foods and liquids according to her longtime partner.  He has lost weight over the past several months. Patient's partner returned from church on Sunday and the patient was less responsive.  She had recently been having swelling in her feet, difficulty walking and falling at home.  EMS arrived and partner says  glucose was 23.  Hgb 6.4 >> 1 PRBC >> 10.  MCV 116.  WBC is low, 1.8 today. INR 1.4. BUN normal, creatinine 1.5 C/W AKI T bili 1.0.  Alkaline phosphatase 213.  AST/ALT 53/32.  Albumin 2.1.  LFTs are improving. Lipase 13. Vitamin B12 not deficient, measures 2423.  Folate is low at 4.7.  TIBC low but iron, iron sat and ferritin okay. 04/29/2019 head CT.  Unremarkable. 04/30/2019 CTAP with contrast: Trace left pleural effusion.  Punctate nodules at pulmonary apices.  Dilated GB with increased intraluminal density, possibly sludge.  No inflammatory changes in gallbladder.  Stable enlargement of CBD.  Hepatic steatosis.  Trace abdominopelvic ascites.  S/P gastric bypass, no evidence for obstruction. 04/30/2019 abdominal ultrasound.  Sludge in gallbladder which is distended.  8 mm prominent CBD.  1.6 x 1.4 x 1.0 cm complex cystic lesion in the left lobe of liver.  Slight ascites. 05/01/2019 MRI, MRCP: Distended gallbladder, filled with sludge.  No GB wall thickening or pericholecystic fluid.  7 mm CBD.  Severe hepatic steatosis.  Bilateral hydronephrosis and hydroureter with distended urinary bladder.  Diffusely echogenic liver.  Pleural effusions, trace ascites, anasarca. 2D echo 04/30/2019.  LVEF 50 to 55%.  No valvular issues.  Normal right ventricular systolic function  Patient's significant other of  9 years thinks her "esophagus is closing up again".  The postprandial nausea vomiting is similar to what she experienced when she developed stricture of the GJ anastomosis.  There is no documentation that the patient has ever had esophageal strictures.  Patient has had "refractory hypoglycemia" but random cortisol level, cosyntropin stim test did not confirm adrenal insufficiency.  She is receiving IV dextrose and sugars recently have been reading in the 200s. Patient herself tells me that it feels like the food is stopping up sometimes in region of her lower esophagus and sometimes in the stomach.      Past  Medical History:  Diagnosis Date  . Abnormal Pap smear   . Alcohol abuse   . Anemia    IDA  . Anxiety    Takes xanax  . Arthritis    left hip/knees, lower spine  . Bipolar disorder ([name])   . Blood transfusion without reported diagnosis last done 04-25-17  . Bulging lumbar disc   . Bursitis    left shoulder  . Chronic lower back pain   . Cut    right middle finger with knife small cut healing pt instructed to keep clean and dry no redness or drainage  . Depression   . Diverticulitis   . Fibromyalgia   . GERD (gastroesophageal reflux disease)   . Hypertension    none since weight loss  . Lactose intolerance in adult 2017  . Migraines    "weekly @ least" (04/26/2016)  . Seizures ([place])    one Dec. 2017 due to throat closing  . Type II diabetes mellitus ([place])    "before the gastric bypass" (04/26/2016) states she no longer has diabetes    Past Surgical History:  Procedure Laterality Date  . BALLOON DILATION N/A 05/27/2016   Procedure: BALLOON DILATION;  Surgeon: Doran Stabler, MD;  Location: [facility];  Service: Gastroenterology;  Laterality: N/A;  . BALLOON DILATION N/A 04/21/2017   Procedure: BALLOON DILATION;  Surgeon: Doran Stabler, MD;  Location: [facility];  Service: Gastroenterology;  Laterality: N/A;  . CERVICAL CONE BIOPSY    . [place]; 1996; 1998; 2014  . CESAREAN SECTION WITH BILATERAL TUBAL LIGATION Bilateral 10/28/2012   Procedure: Repeat cesarean section with delivery of baby boy at 31. Apgars 8/9.  BILATERAL TUBAL LIGATION;  Surgeon: Florian Buff, MD;  Location: [facility];  Service: Obstetrics;  Laterality: Bilateral;  . DILATION AND CURETTAGE OF UTERUS    . ESOPHAGOGASTRODUODENOSCOPY N/A 05/27/2016   Procedure: ESOPHAGOGASTRODUODENOSCOPY (EGD);  Surgeon: Doran Stabler, MD;  Location: [facility];  Service: Gastroenterology;  Laterality: N/A;  . ESOPHAGOGASTRODUODENOSCOPY (EGD) WITH PROPOFOL N/A 04/23/2016   Procedure:  ESOPHAGOGASTRODUODENOSCOPY (EGD) WITH PROPOFOL;  Surgeon: Doran Stabler, MD;  Location: [facility];  Service: Endoscopy;  Laterality: N/A;  . ESOPHAGOGASTRODUODENOSCOPY (EGD) WITH PROPOFOL N/A 01/03/2017   Procedure: ESOPHAGOGASTRODUODENOSCOPY (EGD) WITH PROPOFOL;  Surgeon: Doran Stabler, MD;  Location: [facility];  Service: Gastroenterology;  Laterality: N/A;  . ESOPHAGOGASTRODUODENOSCOPY (EGD) WITH PROPOFOL N/A 04/21/2017   Procedure: ESOPHAGOGASTRODUODENOSCOPY (EGD) WITH PROPOFOL;  Surgeon: Doran Stabler, MD;  Location: [facility];  Service: Gastroenterology;  Laterality: N/A;  . GASTROSTOMY N/A 08/16/2016   Procedure: LAPAROSCOPIC INSERTION OF GASTROSTOMY TUBE IN REMNANT STOMACH;  Surgeon: Arta Bruce Kinsinger, MD;  Location: [facility];  Service: General;  Laterality: N/A;  . GASTROSTOMY N/A 05/16/2017   Procedure: Replacement of  GASTROSTOMY TUBE;  Surgeon: [name],  MD;  Location: [facility];  Service: General;  Laterality: N/A;  . LAPAROSCOPIC REVISION OF GASTROJEJUNOSTOMY Left 05/16/2017   Procedure: LAPAROSCOPIC REVISION OF GASTROJEJUNOSTOMY;  Surgeon: Kieth Brightly Arta Bruce, MD;  Location: [facility];  Service: General;  Laterality: Left;  . ROUX-EN-Y GASTRIC BYPASS  2007  . TUBAL LIGATION  2014    Prior to Admission medications   Medication Sig Start Date End Date Taking? Authorizing Provider  ALPRAZolam Duanne Moron) 1 MG tablet Take 1 tablet (1 mg total) by mouth at bedtime as needed for anxiety. Patient taking differently: Take 1 mg by mouth 3 (three) times daily.  08/27/16  Yes Lauree Chandler, NP  CARAFATE 1 GM/10ML suspension Take 10 mLs by mouth 3 (three) times daily before meals. 01/04/18  Yes [provider]  cyclobenzaprine (FLEXERIL) 10 MG tablet Take 10 mg by mouth 3 (three) times daily. 12/28/17  Yes [provider]  famotidine (PEPCID) 40 MG tablet Take 40 mg by mouth daily. 11/27/18  Yes [provider]  GAS RELIEF 20 MG/0.3ML drops  Take 1 mL by mouth every 6 (six) hours as needed for flatulence.  11/25/17  Yes [provider]  lidocaine (XYLOCAINE) 5 % ointment Apply 1 application topically 3 (three) times daily. Apply to Bilateral shoulders 05/11/16  Yes [provider]  lisinopril-hydrochlorothiazide (ZESTORETIC) 10-12.5 MG tablet Take 1 tablet by mouth daily. 04/16/19  Yes [provider]  Multiple Vitamins-Minerals (MULTIVITAMIN WITH IRON-MINERALS) liquid Take 5 mLs by mouth daily.   Yes [provider]  OLANZapine (ZYPREXA) 2.5 MG tablet Take 2.5 mg by mouth daily. 12/25/17  Yes [provider]  oxyCODONE (ROXICODONE) 15 MG immediate release tablet Take 15 mg by mouth every 6 (six) hours. 01/12/18  Yes [provider]  oxymetazoline (AFRIN) 0.05 % nasal spray Place 2 sprays into both nostrils 2 (two) times daily as needed for congestion.   Yes [provider]  QUEtiapine (SEROQUEL) 50 MG tablet Take 50 mg by mouth at bedtime. 04/06/19  Yes [provider]    Scheduled Meds: . enoxaparin (LOVENOX) injection  40 mg Subcutaneous Daily  . feeding supplement  1 Container Oral TID BM  . feeding supplement (PRO-STAT SUGAR FREE 64)  30 mL Oral BID  . folic acid  1 mg Intravenous Daily  . OLANZapine  2.5 mg Oral Daily  . oxyCODONE  5 mg Oral Q6H  . pantoprazole  40 mg Oral QHS  . QUEtiapine  50 mg Oral QHS  . thiamine injection  100 mg Intravenous Daily   Infusions: . ceFEPime (MAXIPIME) IV 2 g (05/01/19 1221)  . dextrose 50 mL/hr at 05/01/19 0156  . lactated ringers 50 mL/hr at 04/30/19 1143  . vancomycin 500 mg (05/01/19 0304)   PRN Meds: acetaminophen **OR** acetaminophen, ALPRAZolam, ondansetron **OR** ondansetron (ZOFRAN) IV   Allergies as of 04/29/2019 - Review Complete 04/29/2019  Allergen Reaction Noted  . Iron Anaphylaxis 04/24/2016    Family History  Problem Relation Age of Onset  . Diabetes Father   . Heart disease Father   .  Depression Maternal Grandmother   . Heart disease Maternal Grandfather   . Depression Paternal Grandmother   . Colon cancer Paternal Grandfather   . Pancreatic cancer Paternal Grandfather     Social History   Socioeconomic History  . Marital status: Widowed    Spouse name: Not on file  . Number of children: 4  . Years of education: Not on file  . Highest education level:  Not on file  Occupational History  . Not on file  Tobacco Use  . Smoking status: Former Smoker    Packs/day: 0.50    Years: 4.00    Pack years: 2.00    Types: Cigarettes    Quit date: 03/16/2012    Years since quitting: 7.1  . Smokeless tobacco: Never Used  Substance and Sexual Activity  . Alcohol use: Yes    Alcohol/week: 17.0 standard drinks    Types: 6 Glasses of wine, 11 Shots of liquor per week    Comment: alcohol tx none x 1 year  . Drug use: No  . Sexual activity: Yes    Birth control/protection: None  Other Topics Concern  . Not on file  Social History Narrative  . Not on file   Social Determinants of Health   Financial Resource Strain:   . Difficulty of Paying Living Expenses: Not on file  Food Insecurity:   . Worried About Charity fundraiser in the Last Year: Not on file  . Ran Out of Food in the Last Year: Not on file  Transportation Needs:   . Lack of Transportation (Medical): Not on file  . Lack of Transportation (Non-Medical): Not on file  Physical Activity:   . Days of Exercise per Week: Not on file  . Minutes of Exercise per Session: Not on file  Stress:   . Feeling of Stress : Not on file  Social Connections:   . Frequency of Communication with Friends and Family: Not on file  . Frequency of Social Gatherings with Friends and Family: Not on file  . Attends Religious Services: Not on file  . Active Member of Clubs or Organizations: Not on file  . Attends Archivist Meetings: Not on file  . Marital Status: Not on file  Intimate Partner Violence:   . Fear of  Current or Ex-Partner: Not on file  . Emotionally Abused: Not on file  . Physically Abused: Not on file  . Sexually Abused: Not on file    REVIEW OF SYSTEMS: Constitutional: Weakness, falling at home.  Failure to thrive ENT:  No nose bleeds Pulm: No shortness of breath.  No cough CV:  No palpitations, no LE edema.  GU:  No hematuria, no frequency GI: See HPI Heme: No reports of unusual bleeding or bruising. Transfusions: Required blood transfusions in 2014, 01/2016, 04/2017. Neuro:  No headaches, no peripheral tingling or numbness Derm:  No itching, no rash or sores.  Endocrine:  No sweats or chills.  No polyuria or dysuria Immunization: Viewed. Travel:  None beyond local counties in last few months.    PHYSICAL EXAM: Vital signs in last 24 hours: Vitals:   05/01/19 1103 05/01/19 1152  BP:    Pulse: 72 68  Resp: 12   Temp:    SpO2: 100%    Wt Readings from Last 3 Encounters:  05/01/19 54.5 kg  05/25/17 57.8 kg  05/13/17 48.1 kg    General: Hectic, malnourished, fatigued, ill looking Head: No facial asymmetry or swelling.  No signs of head trauma.  Temporal wasting. Eyes: No scleral icterus.  No conjunctival pallor.  EOMI. Ears: Not hard of hearing Nose: No congestion or discharge Mouth: Edentulous.  Mucosa moist, pink, clear.  Tongue midline Neck: No JVD, no masses, no thyromegaly. Lungs: Clear bilaterally.  No labored breathing or cough Heart: RRR.  No MRG.  S1, S2 present Abdomen: Thin, no masses.  Active bowel sounds.  Not tender, not  distended.  Well-healed surgical scars..   Rectal: Deferred Musc/Skeltl: No joint swelling or gross deformity. Extremities: Left greater than right pedal edema, 2+ pitting. Neurologic: Slow speech.  Delayed but appropriate responses to questions.  Oriented to place, self.  No tremors, moves all 4 limbs, strength not tested. Skin: Pale. Nodes: No cervical adenopathy Psych: Cooperative, pleasant, laconic speech  pattern  Intake/Output from previous day: 01/04 0701 - 01/05 0700 In: 1293.8 [I.V.:1193.8; IV Piggyback:100] Out: -  Intake/Output this shift: Total I/O In: -  Out: 250 [Urine:250]  LAB RESULTS: Recent Labs    04/29/19 2359 04/30/19 0554 05/01/19 0949  WBC 3.2* 5.9 1.8*  HGB 6.4* 9.0* 10.0*  HCT 19.7* 28.3* 30.0*  PLT 210 179 167   BMET Lab Results  Component Value Date   NA 137 05/01/2019   NA 138 04/30/2019   NA 135 04/29/2019   K 4.2 05/01/2019   K 3.8 04/30/2019   K 3.6 04/29/2019   CL 111 05/01/2019   CL 112 (H) 04/30/2019   CL 113 (H) 04/29/2019   CO2 16 (L) 05/01/2019   CO2 13 (L) 04/30/2019   CO2 15 (L) 04/29/2019   GLUCOSE 237 (H) 05/01/2019   GLUCOSE 122 (H) 04/30/2019   GLUCOSE 191 (H) 04/29/2019   BUN 12 05/01/2019   BUN 12 04/30/2019   BUN 12 04/29/2019   CREATININE 1.28 (H) 05/01/2019   CREATININE 1.54 (H) 04/30/2019   CREATININE 1.39 (H) 04/29/2019   CALCIUM 7.8 (L) 05/01/2019   CALCIUM 7.7 (L) 04/30/2019   CALCIUM 7.6 (L) 04/29/2019   LFT Recent Labs    04/29/19 1650 04/29/19 2359 04/30/19 0554 05/01/19 0804  PROT 5.8* 4.7* 4.8* 5.0*  ALBUMIN 2.4* 2.0* 2.0* 2.1*  AST 53* 36 39 31  ALT 32 26 26 29   ALKPHOS 213* 173* 162* 165*  BILITOT 1.0 0.7 1.2 0.8  BILIDIR 0.6*  --   --   --   IBILI 0.4  --   --   --    PT/INR Lab Results  Component Value Date   INR 1.4 (H) 05/01/2019   INR 1.00 04/25/2017   INR 0.89 08/12/2016   Hepatitis Panel No results for input(s): HEPBSAG, HCVAB, HEPAIGM, HEPBIGM in the last 72 hours. C-Diff No components found for: CDIFF Lipase     Component Value Date/Time   LIPASE 13 04/29/2019 1650    Drugs of Abuse     Component Value Date/Time   LABOPIA POSITIVE (A) 04/29/2019 1715   COCAINSCRNUR NONE DETECTED 04/29/2019 1715   LABBENZ POSITIVE (A) 04/29/2019 1715   AMPHETMU NONE DETECTED 04/29/2019 1715   THCU NONE DETECTED 04/29/2019 1715   LABBARB NONE DETECTED 04/29/2019 1715     RADIOLOGY  STUDIES: DG Pelvis 1-2 Views  Result Date: 04/29/2019 CLINICAL DATA:  Pain status post fall EXAM: PELVIS - 1-2 VIEW COMPARISON:  July 20, 2014 FINDINGS: There is no evidence of pelvic fracture or diastasis. No pelvic bone lesions are seen. IMPRESSION: Negative. Electronically Signed   By: Constance Holster M.D.   On: 04/29/2019 16:41   CT Head Wo Contrast  Result Date: 04/29/2019 CLINICAL DATA:  Patient unresponsive. Minor head trauma. EXAM: CT HEAD WITHOUT CONTRAST CT CERVICAL SPINE WITHOUT CONTRAST TECHNIQUE: Multidetector CT imaging of the head and cervical spine was performed following the standard protocol without intravenous contrast. Multiplanar CT image reconstructions of the cervical spine were also generated. COMPARISON:  CT head 04/22/2016. FINDINGS: CT HEAD FINDINGS Brain: No evidence of acute infarction, hemorrhage,  hydrocephalus, extra-axial collection or mass lesion/mass effect. Vascular: No hyperdense vessel or unexpected calcification. Skull: Normal. Negative for fracture or focal lesion. Sinuses/Orbits: No acute finding. Other: None. CT CERVICAL SPINE FINDINGS Alignment: Normal. Skull base and vertebrae: The vertebral body heights are well preserved. No fracture the Soft tissues and spinal canal: No prevertebral fluid or swelling. No visible canal hematoma. Disc levels:  Normal Upper chest: Unremarkable Other: None IMPRESSION: 1. No acute intracranial abnormalities. 2. No evidence for cervical spine fracture. Electronically Signed   By: Kerby Moors M.D.   On: 04/29/2019 18:12   CT Chest W Contrast  Result Date: 04/30/2019 CLINICAL DATA:  Abdomen distension EXAM: CT CHEST, ABDOMEN, AND PELVIS WITH CONTRAST TECHNIQUE: Multidetector CT imaging of the chest, abdomen and pelvis was performed following the standard protocol during bolus administration of intravenous contrast. CONTRAST:  45mL OMNIPAQUE IOHEXOL 300 MG/ML  SOLN COMPARISON:  CT 08/12/2017 FINDINGS: CT CHEST FINDINGS  Cardiovascular: Nonaneurysmal aorta. Coronary vascular calcification. Normal heart size. No pericardial effusion. Mediastinum/Nodes: Midline trachea. No thyroid mass. No significant adenopathy. Esophagus unremarkable Lungs/Pleura: Punctate 1-2 mm nodules at the apices. No consolidation or pneumothorax. Small left-sided pleural effusion. Musculoskeletal: No chest wall mass or suspicious bone lesions identified. CT ABDOMEN PELVIS FINDINGS Hepatobiliary: Hepatic steatosis. Distended gallbladder with increased density probably sludge. No calcified stone. Stable enlargement of the extrahepatic common bile duct, measuring up to 9 mm. Pancreas: Atrophic.  No inflammatory changes Spleen: Normal in size without focal abnormality. Adrenals/Urinary Tract: Adrenal glands are normal. No hydronephrosis. Subcentimeter hypodensity within the mid to lower left kidney too small to further characterize. Distended urinary bladder. Stomach/Bowel: Status post gastric bypass. Anterior extension of the gastric body with linear scarring in the abdominal wall presumably due to prior gastrostomy tube. Portion of the gastric wall extends into the old tube tract. Negative for bowel obstruction or bowel wall thickening. Negative appendix. Vascular/Lymphatic: Nonaneurysmal aorta.  No significant adenopathy Reproductive: Uterus and bilateral adnexa are unremarkable. Other: No free air.  Small amount of abdominopelvic ascites. Musculoskeletal: No acute or significant osseous findings. IMPRESSION: 1. Trace left-sided pleural effusion.  No acute airspace disease. 2. Punctate nodules at the apices. No follow-up needed if patient is low-risk (and has no known or suspected primary neoplasm). Non-contrast chest CT can be considered in 12 months if patient is high-risk. This recommendation follows the consensus statement: Guidelines for Management of Incidental Pulmonary Nodules Detected on CT Images: From the Fleischner Society 2017; Radiology 2017;  [id number]. 3. Dilated gallbladder with increased intraluminal density, possible sludge. No inflammatory changes by CT. Stable enlargement of the extrahepatic common bile duct. 4. Hepatic steatosis. 5. Small amount of ascites within the abdomen and pelvis 6. 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 Electronically Signed   By: Donavan Foil M.D.   On: 04/30/2019 01:56   CT Cervical Spine Wo Contrast  Result Date: 04/29/2019 CLINICAL DATA:  Patient unresponsive. Minor head trauma. EXAM: CT HEAD WITHOUT CONTRAST CT CERVICAL SPINE WITHOUT CONTRAST TECHNIQUE: Multidetector CT imaging of the head and cervical spine was performed following the standard protocol without intravenous contrast. Multiplanar CT image reconstructions of the cervical spine were also generated. COMPARISON:  CT head 04/22/2016. FINDINGS: CT HEAD FINDINGS Brain: No evidence of acute infarction, hemorrhage, hydrocephalus, extra-axial collection or mass lesion/mass effect. Vascular: No hyperdense vessel or unexpected calcification. Skull: Normal. Negative for fracture or focal lesion. Sinuses/Orbits: No acute finding. Other: None. CT CERVICAL SPINE FINDINGS Alignment: Normal. Skull base and vertebrae: The vertebral body heights  are well preserved. No fracture the Soft tissues and spinal canal: No prevertebral fluid or swelling. No visible canal hematoma. Disc levels:  Normal Upper chest: Unremarkable Other: None IMPRESSION: 1. No acute intracranial abnormalities. 2. No evidence for cervical spine fracture. Electronically Signed   By: Kerby Moors M.D.   On: 04/29/2019 18:12   MR THORACIC SPINE WO CONTRAST  Result Date: 04/30/2019 CLINICAL DATA:  Increasing weakness over the past 3 weeks with edema in both lower extremities and difficulty walking. No known injury. EXAM: MRI THORACIC SPINE WITHOUT CONTRAST TECHNIQUE: Multiplanar, multisequence MR imaging of the thoracic spine was performed. No intravenous contrast was  administered. COMPARISON:  Plain films thoracic spine 07/20/2014. FINDINGS: Alignment:  Normal. Vertebrae: No fracture, evidence of discitis, or bone lesion. Cord:  Normal signal and morphology. Paraspinal and other soft tissues: Small bilateral pleural effusions are seen. Disc levels: The central spinal canal and neural foramina are widely patent at all levels. Minimal disc bulging at T9-10 is noted. IMPRESSION: Normal appearing thoracic spine and spinal cord. Negative for stenosis. Small bilateral pleural effusions. Electronically Signed   By: Inge Rise M.D.   On: 04/30/2019 08:49   MR LUMBAR SPINE WO CONTRAST  Result Date: 04/30/2019 CLINICAL DATA:  Increasing weakness over the past 3 weeks with bilateral lower extremity edema and difficulty walking. EXAM: MRI LUMBAR SPINE WITHOUT CONTRAST TECHNIQUE: Multiplanar, multisequence MR imaging of the lumbar spine was performed. No intravenous contrast was administered. COMPARISON:  None. FINDINGS: Segmentation:  Standard. Alignment:  Normal. Vertebrae:  No fracture, evidence of discitis, or bone lesion. Conus medullaris and cauda equina: Conus extends to the L2 level. Conus and cauda equina appear normal. Paraspinal and other soft tissues: Negative. Disc levels: The central spinal canal and neural foramina are widely patent at all levels. Very shallow disc bulge is seen at L4-5. Mild-to-moderate facet degenerative disease is seen at L4-5 and L5-S1. IMPRESSION: Mild lower lumbar spondylosis without central canal or foraminal narrowing. No finding to explain the patient's symptoms. Electronically Signed   By: Inge Rise M.D.   On: 04/30/2019 08:51   CT ABDOMEN PELVIS W CONTRAST  Result Date: 04/30/2019 CLINICAL DATA:  Abdomen distension EXAM: CT CHEST, ABDOMEN, AND PELVIS WITH CONTRAST TECHNIQUE: Multidetector CT imaging of the chest, abdomen and pelvis was performed following the standard protocol during bolus administration of intravenous contrast.  CONTRAST:  20mL OMNIPAQUE IOHEXOL 300 MG/ML  SOLN COMPARISON:  CT 08/12/2017 FINDINGS: CT CHEST FINDINGS Cardiovascular: Nonaneurysmal aorta. Coronary vascular calcification. Normal heart size. No pericardial effusion. Mediastinum/Nodes: Midline trachea. No thyroid mass. No significant adenopathy. Esophagus unremarkable Lungs/Pleura: Punctate 1-2 mm nodules at the apices. No consolidation or pneumothorax. Small left-sided pleural effusion. Musculoskeletal: No chest wall mass or suspicious bone lesions identified. CT ABDOMEN PELVIS FINDINGS Hepatobiliary: Hepatic steatosis. Distended gallbladder with increased density probably sludge. No calcified stone. Stable enlargement of the extrahepatic common bile duct, measuring up to 9 mm. Pancreas: Atrophic.  No inflammatory changes Spleen: Normal in size without focal abnormality. Adrenals/Urinary Tract: Adrenal glands are normal. No hydronephrosis. Subcentimeter hypodensity within the mid to lower left kidney too small to further characterize. Distended urinary bladder. Stomach/Bowel: Status post gastric bypass. Anterior extension of the gastric body with linear scarring in the abdominal wall presumably due to prior gastrostomy tube. Portion of the gastric wall extends into the old tube tract. Negative for bowel obstruction or bowel wall thickening. Negative appendix. Vascular/Lymphatic: Nonaneurysmal aorta.  No significant adenopathy Reproductive: Uterus and bilateral adnexa are unremarkable. Other:  No free air.  Small amount of abdominopelvic ascites. Musculoskeletal: No acute or significant osseous findings. IMPRESSION: 1. Trace left-sided pleural effusion.  No acute airspace disease. 2. Punctate nodules at the apices. No follow-up needed if patient is low-risk (and has no known or suspected primary neoplasm). Non-contrast chest CT can be considered in 12 months if patient is high-risk. This recommendation follows the consensus statement: Guidelines for Management of  Incidental Pulmonary Nodules Detected on CT Images: From the Fleischner Society 2017; Radiology 2017; 284:228-243. 3. Dilated gallbladder with increased intraluminal density, possible sludge. No inflammatory changes by CT. Stable enlargement of the extrahepatic common bile duct. 4. Hepatic steatosis. 5. Small amount of ascites within the abdomen and pelvis 6. 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 Electronically Signed   By: Donavan Foil M.D.   On: 04/30/2019 01:56   MR 3D Recon At Scanner  Result Date: 05/01/2019 CLINICAL DATA:  Biliary colic, distended gallbladder with gall sludge by prior CT and ultrasound EXAM: MRI ABDOMEN WITHOUT AND WITH CONTRAST (INCLUDING MRCP) TECHNIQUE: Multiplanar multisequence MR imaging of the abdomen was performed both before and after the administration of intravenous contrast. Heavily T2-weighted images of the biliary and pancreatic ducts were obtained, and three-dimensional MRCP images were rendered by post processing. CONTRAST:  5.61mL GADAVIST GADOBUTROL 1 MMOL/ML IV SOLN COMPARISON:  None. FINDINGS: Lower chest: Trace bilateral pleural effusions. Hepatobiliary: No hepatic mass. Severe hepatic steatosis. The gallbladder is distended and sludge filled. No discrete calculi are appreciated. The common bile duct measures up to 7 mm in caliber distally to the ampulla, and there no significant intrahepatic biliary ductal dilatation. There is no calculus or other obstructing lesion appreciated within the biliary tract. Pancreas: No mass, inflammatory changes, or other parenchymal abnormality identified. Spleen:  Within normal limits in size and appearance. Adrenals/Urinary Tract: No masses identified. Moderate bilateral hydronephrosis and hydroureter of the included portions of the ureters with a distended urinary bladder seen in the included lower abdomen. Stomach/Bowel: Visualized portions within the abdomen are unremarkable. Vascular/Lymphatic: No  pathologically enlarged lymph nodes identified. No abdominal aortic aneurysm demonstrated. Other:  Anasarca. Trace ascites. Musculoskeletal: No suspicious bone lesions identified. IMPRESSION: 1. The gallbladder is distended and sludge filled. No discrete calculi are appreciated. The common bile duct measures up to 7 mm in caliber distally to the ampulla, and there no significant intrahepatic biliary ductal dilatation. There is no calculus or other obstructing lesion appreciated within the biliary tract. 2. There is no gallbladder wall thickening or pericholecystic fluid to suggest acute cholecystitis. 3. Severe hepatic steatosis. 4. Moderate bilateral hydronephrosis and hydroureter of the included portions of the ureters with distended urinary bladder seen in the included lower abdomen. Correlate for urinary retention. 5. Pleural effusions, trace ascites, and anasarca. Electronically Signed   By: Eddie Candle M.D.   On: 05/01/2019 08:28   DG Chest Portable 1 View  Result Date: 04/29/2019 CLINICAL DATA:  Fall. EXAM: PORTABLE CHEST 1 VIEW COMPARISON:  07/20/2014. FINDINGS: The heart size and mediastinal contours are within normal limits. Both lungs are clear. The visualized skeletal structures are unremarkable. IMPRESSION: No active disease. Electronically Signed   By: Kerby Moors M.D.   On: 04/29/2019 16:38   MR ABDOMEN MRCP W WO CONTAST  Result Date: 05/01/2019 CLINICAL DATA:  Biliary colic, distended gallbladder with gall sludge by prior CT and ultrasound EXAM: MRI ABDOMEN WITHOUT AND WITH CONTRAST (INCLUDING MRCP) TECHNIQUE: Multiplanar multisequence MR imaging of the abdomen was performed both before and after  the administration of intravenous contrast. Heavily T2-weighted images of the biliary and pancreatic ducts were obtained, and three-dimensional MRCP images were rendered by post processing. CONTRAST:  5.56mL GADAVIST GADOBUTROL 1 MMOL/ML IV SOLN COMPARISON:  None. FINDINGS: Lower chest: Trace  bilateral pleural effusions. Hepatobiliary: No hepatic mass. Severe hepatic steatosis. The gallbladder is distended and sludge filled. No discrete calculi are appreciated. The common bile duct measures up to 7 mm in caliber distally to the ampulla, and there no significant intrahepatic biliary ductal dilatation. There is no calculus or other obstructing lesion appreciated within the biliary tract. Pancreas: No mass, inflammatory changes, or other parenchymal abnormality identified. Spleen:  Within normal limits in size and appearance. Adrenals/Urinary Tract: No masses identified. Moderate bilateral hydronephrosis and hydroureter of the included portions of the ureters with a distended urinary bladder seen in the included lower abdomen. Stomach/Bowel: Visualized portions within the abdomen are unremarkable. Vascular/Lymphatic: No pathologically enlarged lymph nodes identified. No abdominal aortic aneurysm demonstrated. Other:  Anasarca. Trace ascites. Musculoskeletal: No suspicious bone lesions identified. IMPRESSION: 1. The gallbladder is distended and sludge filled. No discrete calculi are appreciated. The common bile duct measures up to 7 mm in caliber distally to the ampulla, and there no significant intrahepatic biliary ductal dilatation. There is no calculus or other obstructing lesion appreciated within the biliary tract. 2. There is no gallbladder wall thickening or pericholecystic fluid to suggest acute cholecystitis. 3. Severe hepatic steatosis. 4. Moderate bilateral hydronephrosis and hydroureter of the included portions of the ureters with distended urinary bladder seen in the included lower abdomen. Correlate for urinary retention. 5. Pleural effusions, trace ascites, and anasarca. Electronically Signed   By: Eddie Candle M.D.   On: 05/01/2019 08:28   ECHOCARDIOGRAM COMPLETE  Result Date: 04/30/2019   ECHOCARDIOGRAM REPORT   Patient Name:   COLIN PUCK Delval Date of Exam: 04/30/2019 Medical Rec #:   [id number]       Height:       60.0 in Accession #:    [id number]      Weight:       127.4 lb Date of Birth:  20-Apr-1966       BSA:          1.54 m Patient Age:    39 years        BP:           101/73 mmHg Patient Gender: F               HR:           82 bpm. 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 Risk Factors:Former Smoker. ETOH abuse.  Sonographer:    Clayton Lefort RDCS (AE) Referring Phys: [place]  1. Left ventricular ejection fraction, by visual estimation, is 50 to 55%. The left ventricle has normal function. There is no left ventricular hypertrophy.  2. The left ventricle has no regional wall motion abnormalities.  3. Global right ventricle has normal systolic function.The right ventricular size is normal. No increase in right ventricular wall thickness.  4. Left atrial size was normal.  5. Right atrial size was normal.  6. The mitral valve is normal in structure. No evidence of mitral valve regurgitation.  7. The  tricuspid valve is normal in structure.  8. The aortic valve is normal in structure. Aortic valve regurgitation is not visualized.  9. The pulmonic valve was normal in structure. Pulmonic valve regurgitation is not visualized. 10. Normal pulmonary artery systolic pressure. 11. The atrial septum is grossly normal. Electronically signed by Mertie Moores MD Signature Date/Time: 04/30/2019/4:21:25 PM    Final    US Abdomen Limited RUQ  Result Date: 04/30/2019 CLINICAL DATA:  Nausea and vomiting EXAM: ULTRASOUND ABDOMEN LIMITED RIGHT UPPER QUADRANT COMPARISON:  CT abdomen and pelvis April 30, 2019 FINDINGS: Gallbladder: Gallbladder appears mildly distended, similar to CT examination with sludge within the gallbladder. Inspissated sludge is noted posteriorly. No gallstones are evident. No gallbladder wall  thickening or pericholecystic fluid. No sonographic Murphy sign noted by sonographer. Common bile duct: Diameter: 8 mm, prominent. No biliary duct mass or calculus evident. There appears to be mild intrahepatic biliary duct dilatation. Liver: There is a focal cystic area with septation in the left lobe of the liver measuring 1.6 x 1.4 x 1.0 cm. Liver echogenicity is diffusely increased. Portal vein is patent on color Doppler imaging with normal direction of blood flow towards the liver. Other: There is mild ascites adjacent to the liver. The pyramids of the right kidney appear rather prominent. No obstructing focus in the right kidney. IMPRESSION: 1. Gallbladder distended with sludge present. No gallstones seen; small gallstones could be obscured by sludge. No gallbladder wall thickening or pericholecystic fluid. 2. Biliary duct dilatation. No mass or calculus is appreciable on this study in the biliary ductal system. From an imaging standpoint, MRCP would be the optimum imaging study of choice to further evaluate the biliary ductal system. 3. Complex cystic lesion in the left lobe of the liver measuring 1.6 x 1.4 x 1.0 cm. Underlying hepatic steatosis. 4.  Slight ascites. 5. Prominence of renal pyramids, a finding of uncertain significance. No right renal lesions seen on CT examination performed earlier in the day. Electronically Signed   By: Lowella Grip III M.D.   On: 04/30/2019 09:35     IMPRESSION:   *   Postprandial vomiting with possible dysphagia.   Symptoms similar to those associated with postsurgical stricture at [place] anastomosis of [place] bariatric surgery.  Underwent several dilations of the stricture and ultimately revision of GJ anastomosis in 04/2017.  *     Failure to thrive, weight loss.  Along with ascites seen on imaging, suspect protein calorie malnutrition.  Patient required supplemental feedings via G-tube in 2018. RD has placed patient on breeze, prostat, Magic cup  supplements.  *     Hypoglycemia.  Serum glucose improved with IV dextrose.  *     Macrocytic anemia. Hgb improved after 1 PRBC. Folate deficient, B12 levels above normal.  Suspect malabsorption as well as poor p.o. intake/malnutrition. FOBT negative. IV folic acid day 1.    *   Pancytopenia.  *    Hepatic steatosis with elevated LFTs.  Gallbladder sludge, prominent CBD and cystic lesion in left lower lobe liver.  *    Bilateral hydronephrosis, hydroureter.  *   Meets sepsis criteria.  ? Source.  VAnc and Zosyn in place.    *    CKD 3.     PLAN:     *   EGD tomorrow ~ 11AM w Dr Henrene Pastor.  Stopped Lovenox to allow for lower bleeding risk at EGD.     Azucena Freed  05/01/2019, 3:21 PM Phone [phone]

## 2019-05-01 NOTE — Progress Notes (Addendum)
Initial Nutrition Assessment  INTERVENTION:   Monitor magnesium, potassium, and phosphorus daily for at least 3 days, MD to replete as needed, as pt is at risk for refeeding syndrome.  -Boost Breeze po TID, each supplement provides 250 kcal and 9 grams of protein -Prostat liquid protein PO 30 ml BID with meals, each supplement provides 100 kcal, 15 grams protein. -Magic cup BID with meals, each supplement provides 290 kcal and 9 grams of protein  NUTRITION DIAGNOSIS:   Inadequate oral intake related to lethargy/confusion, poor appetite as evidenced by per patient/family report, meal completion < 25%.  GOAL:   Patient will meet greater than or equal to 90% of their needs   MONITOR:   PO intake, Supplement acceptance, Labs, Weight trends, I & O's  REASON FOR ASSESSMENT:   Malnutrition Screening Tool    ASSESSMENT:   54 y.o. female with history of gastric bypass surgery prior to which patient had a history of diabetes mellitus, chronic kidney disease stage III, chronic anemia chronic malnutrition with history of esophageal strictures, bipolar disorder and chronic pain was found to be unresponsive by patient's boyfriend when he returned back from work after 12 hours.  Patient was lying on the recliner and was found to be in the same state.  EMS was called and patient blood sugar was found to be 23.  **RD working remotely**  Attempted to reach pt by phone but no answer.  Patient with history of gastric bypass in 2007. Has a history of malnutrition and PEG.  Pt admitted with hypoglycemia, HgbA1c is 3.8.  Pt has had poor appetite but does drink liquids. Will order [employer], [employer] and Prostat supplements for kcals and protein. If unable to meet needs by PO intake, may need to consider feeding tube placement.   Per weight records, pt has lost 7 lbs over the past year, insignificant for time frame. Suspect malnutrition continues.   I/Os: +2.1L since admit UOP: 250 ml x  24hrs  Medications: IV Folic acid, IV Thiamine, D10 infusion, IV Mg sulfate Labs reviewed: CBGs: 216-393 Low Mg  NUTRITION - FOCUSED PHYSICAL EXAM:  Working remotely.  Diet Order:   Diet Order            Diet regular Room service appropriate? Yes; Fluid consistency: Thin  Diet effective now              EDUCATION NEEDS:   Not appropriate for education at this time  Skin:  Skin Assessment: Reviewed RN Assessment  Last BM:  1/2  Height:   Ht Readings from Last 1 Encounters:  05/16/17 5' (1.524 m)    Weight:   Wt Readings from Last 1 Encounters:  05/01/19 54.5 kg    Ideal Body Weight:  45.4 kg(using ht from 2019)  BMI:  Body mass index is 23.47 kg/m.  Estimated Nutritional Needs:   Kcal:  1650-1850  Protein:  65-80g  Fluid:  1.9L/day   Clayton Bibles, MS, RD, LDN Inpatient Clinical Dietitian Pager: [phone] After Hours Pager: [phone]

## 2019-05-02 ENCOUNTER — Other Ambulatory Visit: Payer: Self-pay

## 2019-05-02 ENCOUNTER — Inpatient Hospital Stay (HOSPITAL_COMMUNITY): Payer: Medicare Other | Admitting: Anesthesiology

## 2019-05-02 ENCOUNTER — Encounter (HOSPITAL_COMMUNITY): Admission: EM | Disposition: A | Discharge status: Home Health | Payer: Self-pay | Source: Home / Self Care

## 2019-05-02 ENCOUNTER — Encounter (HOSPITAL_COMMUNITY): Payer: Self-pay | Admitting: Internal Medicine

## 2019-05-02 HISTORY — PX: ESOPHAGOGASTRODUODENOSCOPY (EGD) WITH PROPOFOL: SHX5813

## 2019-05-02 LAB — CBC
HCT: 26.1 % — ABNORMAL LOW (ref 36.0–46.0)
Hemoglobin: 8.8 g/dL — ABNORMAL LOW (ref 12.0–15.0)
MCH: 33.6 pg (ref 26.0–34.0)
MCHC: 33.7 g/dL (ref 30.0–36.0)
MCV: 99.6 fL (ref 80.0–100.0)
Platelets: 165 10*3/uL (ref 150–400)
RBC: 2.62 MIL/uL — ABNORMAL LOW (ref 3.87–5.11)
RDW: 18.7 % — ABNORMAL HIGH (ref 11.5–15.5)
WBC: 5 10*3/uL (ref 4.0–10.5)
nRBC: 0 % (ref 0.0–0.2)

## 2019-05-02 LAB — GLUCOSE, CAPILLARY
Glucose-Capillary: 101 mg/dL — ABNORMAL HIGH (ref 70–99)
Glucose-Capillary: 102 mg/dL — ABNORMAL HIGH (ref 70–99)
Glucose-Capillary: 102 mg/dL — ABNORMAL HIGH (ref 70–99)
Glucose-Capillary: 105 mg/dL — ABNORMAL HIGH (ref 70–99)
Glucose-Capillary: 123 mg/dL — ABNORMAL HIGH (ref 70–99)
Glucose-Capillary: 137 mg/dL — ABNORMAL HIGH (ref 70–99)
Glucose-Capillary: 155 mg/dL — ABNORMAL HIGH (ref 70–99)
Glucose-Capillary: 177 mg/dL — ABNORMAL HIGH (ref 70–99)
Glucose-Capillary: 185 mg/dL — ABNORMAL HIGH (ref 70–99)
Glucose-Capillary: 55 mg/dL — ABNORMAL LOW (ref 70–99)
Glucose-Capillary: 59 mg/dL — ABNORMAL LOW (ref 70–99)
Glucose-Capillary: 64 mg/dL — ABNORMAL LOW (ref 70–99)
Glucose-Capillary: 67 mg/dL — ABNORMAL LOW (ref 70–99)
Glucose-Capillary: 69 mg/dL — ABNORMAL LOW (ref 70–99)
Glucose-Capillary: 72 mg/dL (ref 70–99)
Glucose-Capillary: 73 mg/dL (ref 70–99)
Glucose-Capillary: 75 mg/dL (ref 70–99)
Glucose-Capillary: 81 mg/dL (ref 70–99)
Glucose-Capillary: 82 mg/dL (ref 70–99)
Glucose-Capillary: 85 mg/dL (ref 70–99)
Glucose-Capillary: 91 mg/dL (ref 70–99)
Glucose-Capillary: 98 mg/dL (ref 70–99)

## 2019-05-02 LAB — COMPREHENSIVE METABOLIC PANEL
ALT: 28 U/L (ref 0–44)
AST: 29 U/L (ref 15–41)
Albumin: 1.9 g/dL — ABNORMAL LOW (ref 3.5–5.0)
Alkaline Phosphatase: 138 U/L — ABNORMAL HIGH (ref 38–126)
Anion gap: 9 (ref 5–15)
BUN: 13 mg/dL (ref 6–20)
CO2: 14 mmol/L — ABNORMAL LOW (ref 22–32)
Calcium: 8.2 mg/dL — ABNORMAL LOW (ref 8.9–10.3)
Chloride: 117 mmol/L — ABNORMAL HIGH (ref 98–111)
Creatinine, Ser: 1.22 mg/dL — ABNORMAL HIGH (ref 0.44–1.00)
GFR calc Af Amer: 59 mL/min — ABNORMAL LOW (ref >60)
GFR calc non Af Amer: 51 mL/min — ABNORMAL LOW (ref >60)
Glucose, Bld: 89 mg/dL (ref 70–99)
Potassium: 4.1 mmol/L (ref 3.5–5.1)
Sodium: 140 mmol/L (ref 135–145)
Total Bilirubin: 0.9 mg/dL (ref 0.3–1.2)
Total Protein: 4.9 g/dL — ABNORMAL LOW (ref 6.5–8.1)

## 2019-05-02 LAB — PROCALCITONIN: Procalcitonin: 1.87 ng/mL

## 2019-05-02 LAB — INSULIN-LIKE GROWTH FACTOR: Somatomedin C: 35 ng/mL — ABNORMAL LOW (ref 65–216)

## 2019-05-02 LAB — C-PEPTIDE: C-Peptide: 1.4 ng/mL (ref 1.1–4.4)

## 2019-05-02 LAB — INSULIN, RANDOM: Insulin: 1.1 u[IU]/mL — ABNORMAL LOW (ref 2.6–24.9)

## 2019-05-02 LAB — PHOSPHORUS: Phosphorus: 4 mg/dL (ref 2.5–4.6)

## 2019-05-02 LAB — MAGNESIUM: Magnesium: 2.1 mg/dL (ref 1.7–2.4)

## 2019-05-02 SURGERY — ESOPHAGOGASTRODUODENOSCOPY (EGD) WITH PROPOFOL
Anesthesia: Monitor Anesthesia Care

## 2019-05-02 MED ORDER — DEXTROSE 10 % IV SOLN: INTRAVENOUS | Status: DC

## 2019-05-02 MED ORDER — PROPOFOL 500 MG/50ML IV EMUL
INTRAVENOUS | Status: DC | PRN
  Administered 2019-05-02: 125 ug/kg/min via INTRAVENOUS

## 2019-05-02 MED ORDER — LIDOCAINE HCL (CARDIAC) PF 100 MG/5ML IV SOSY
PREFILLED_SYRINGE | INTRAVENOUS | Status: DC | PRN
  Administered 2019-05-02: 60 mg via INTRATRACHEAL

## 2019-05-02 MED ORDER — DEXTROSE 50 % IV SOLN
INTRAVENOUS | Status: AC
  Administered 2019-05-02: 50 mL
  Filled 2019-05-02: qty 50

## 2019-05-02 MED ORDER — DEXTROSE 50 % IV SOLN
12.5000 g | INTRAVENOUS | Status: AC
  Administered 2019-05-02: 12.5 g via INTRAVENOUS
  Filled 2019-05-02: qty 50

## 2019-05-02 MED ORDER — DEXTROSE 50 % IV SOLN
INTRAVENOUS | Status: AC
  Filled 2019-05-02: qty 50

## 2019-05-02 MED ORDER — DEXTROSE 50 % IV SOLN
25.0000 mL | Freq: Once | INTRAVENOUS | Status: AC
  Administered 2019-05-02: 25 mL via INTRAVENOUS

## 2019-05-02 SURGICAL SUPPLY — 15 items
BLOCK BITE 60FR ADLT L/F BLUE (MISCELLANEOUS) ×3 IMPLANT
ELECT REM PT RETURN 9FT ADLT (ELECTROSURGICAL) IMPLANT
ELECTRODE REM PT RTRN 9FT ADLT (ELECTROSURGICAL) IMPLANT
FORCEP RJ3 GP 1.8X160 W-NEEDLE (CUTTING FORCEPS) IMPLANT
FORCEPS BIOP RAD 4 LRG CAP 4 (CUTTING FORCEPS) IMPLANT
NDL SCLEROTHERAPY 25GX240 (NEEDLE) IMPLANT
NEEDLE SCLEROTHERAPY 25GX240 (NEEDLE) IMPLANT
PROBE APC STR FIRE (PROBE) IMPLANT
PROBE INJECTION GOLD (MISCELLANEOUS)
PROBE INJECTION GOLD 7FR (MISCELLANEOUS) IMPLANT
SNARE SHORT THROW 13M SML OVAL (MISCELLANEOUS) IMPLANT
SYR 50ML LL SCALE MARK (SYRINGE) IMPLANT
TUBING ENDO SMARTCAP PENTAX (MISCELLANEOUS) ×6 IMPLANT
TUBING IRRIGATION ENDOGATOR (MISCELLANEOUS) ×3 IMPLANT
WATER STERILE IRR 1000ML POUR (IV SOLUTION) IMPLANT

## 2019-05-02 NOTE — Op Note (Signed)
[facility] Patient Name: Angie [name] Procedure Date : 05/02/2019 MRN: [id number] Attending MD: Docia Chuck. Henrene Pastor , MD Date of Birth: 02-14-1966 CSN: [id number] Age: 54 Admit Type: Inpatient Procedure:                Upper GI endoscopy Indications:              Dysphagia and vomiting with food consistencies                            greater than liquids were very soft. History of                            Roux-en-Y gastric bypass surgery complicated by                            anastomotic stricturing requiring repeat dilation.                            Subsequent revision surgery. Now with malnutrition,                            weight loss, metabolic abnormalities. For upper                            endoscopy Providers:                Docia Chuck. Henrene Pastor, MD, Angus Seller, Otho Ket Alfonse Spruce,                            Technician Referring MD:             [facility] hospitalist Medicines:                Monitored Anesthesia Care Complications:            No immediate complications. Estimated Blood Loss:     Estimated blood loss: none. Procedure:                Pre-Anesthesia Assessment:                           - Prior to the procedure, a History and Physical                            was performed, and patient medications and                            allergies were reviewed. The patient's tolerance of                            previous anesthesia was also reviewed. The risks                            and benefits of the procedure and the sedation                            options and risks were discussed with  the patient.                            All questions were answered, and informed consent                            was obtained. Prior Anticoagulants: The patient has                            taken no previous anticoagulant or antiplatelet                            agents. ASA Grade Assessment: II - A patient with                            mild systemic disease.  After reviewing the risks                            and benefits, the patient was deemed in                            satisfactory condition to undergo the procedure.                           After obtaining informed consent, the endoscope was                            passed under direct vision. Throughout the                            procedure, the patient's blood pressure, pulse, and                            oxygen saturations were monitored continuously. The                            [id number]) Olympus gastroscope was                            introduced through the mouth, and advanced to the                            jejunum. The upper GI endoscopy was accomplished                            without difficulty. The patient tolerated the                            procedure well. Scope In: Scope Out: Findings:      The esophagus was normal.      The stomach revealed a tiny gastric remnant with widely patent       gastroenteric anastomosis. No ulcer or other abnormalities. Impression:               1. Status post Roux-en-Y gastric bypass  surgery. No                            stricture or stenosis                           2. Tiny gastric remnant. I wonder if gastric volume                            is so small as to explain her issues with eating                            difficulties, weight loss, and metabolic                            abnormalities. Recommendation:           1. Soft diet                           2. Upper GI series to evaluate gastric remnant size                           3. Ask bariatric surgeons to weigh in on her                            current anatomy to see if she would benefit from                            reversal of her surgery. This could likely be done                            endoscopically.                           4. We will follow Procedure Code(s):        --- Professional ---                           [phone],  Esophagogastroduodenoscopy, flexible,                            transoral; diagnostic, including collection of                            specimen(s) by brushing or washing, when performed                            (separate procedure) Diagnosis Code(s):        --- Professional ---                           R13.10, Dysphagia, unspecified CPT copyright 2019 American Medical Association. All rights reserved. The codes documented in this report are preliminary and upon coder review may  be revised to meet current compliance requirements. Docia Chuck. Henrene Pastor, MD 05/02/2019 12:09:07 PM This report has been signed electronically. Number  of Addenda: 0

## 2019-05-02 NOTE — Progress Notes (Signed)
NT reported CBG 64. Patient denies dizziness or s/s of hypoglycemia. Patient NPO for EGD. Half amp of D50 given. MD notified. Will increase D10 to 18mls/hour. Hand off provided to primary RN. Will continue to monitor.

## 2019-05-02 NOTE — Transfer of Care (Signed)
Immediate Anesthesia Transfer of Care Note  Patient: Angie [name]  Procedure(s) Performed: ESOPHAGOGASTRODUODENOSCOPY (EGD) WITH PROPOFOL (N/A )  Patient Location: Endoscopy Unit  Anesthesia Type:MAC  Level of Consciousness: drowsy and patient cooperative  Airway & Oxygen Therapy: Patient Spontanous Breathing and Patient connected to face mask oxygen  Post-op Assessment: Report given to RN and Post -op Vital signs reviewed and stable  Post vital signs: Reviewed and stable  Last Vitals:  Vitals Value Taken Time  BP 150/87 05/02/19 1154  Temp    Pulse    Resp 11 05/02/19 1154  SpO2      Last Pain:  Vitals:   05/02/19 1031  TempSrc: Oral  PainSc: 7          Complications: No apparent anesthesia complications

## 2019-05-02 NOTE — Anesthesia Preprocedure Evaluation (Signed)
Anesthesia Evaluation  Patient identified by MRN, date of birth, ID band Patient awake    Reviewed: Allergy & Precautions, NPO status , Patient's Chart, lab work & pertinent test results  Airway Mallampati: III       Dental  (+) Edentulous Upper, Edentulous Lower   Pulmonary former smoker,    breath sounds clear to auscultation       Cardiovascular hypertension,  Rhythm:Regular Rate:Normal     Neuro/Psych    GI/Hepatic   Endo/Other  diabetes  Renal/GU      Musculoskeletal   Abdominal   Peds  Hematology   Anesthesia Other Findings   Reproductive/Obstetrics                             Anesthesia Physical Anesthesia Plan  ASA: III  Anesthesia Plan: MAC   Post-op Pain Management:    Induction: Intravenous  PONV Risk Score and Plan: Ondansetron  Airway Management Planned: Nasal Cannula and Simple Face Mask  Additional Equipment:   Intra-op Plan:   Post-operative Plan:   Informed Consent: I have reviewed the patients History and Physical, chart, labs and discussed the procedure including the risks, benefits and alternatives for the proposed anesthesia with the patient or authorized representative who has indicated his/her understanding and acceptance.       Plan Discussed with: CRNA and Anesthesiologist  Anesthesia Plan Comments:         Anesthesia Quick Evaluation

## 2019-05-02 NOTE — Anesthesia Postprocedure Evaluation (Signed)
Anesthesia Post Note  Patient: Angie [name]  Procedure(s) Performed: ESOPHAGOGASTRODUODENOSCOPY (EGD) WITH PROPOFOL (N/A )     Patient location during evaluation: PACU Anesthesia Type: MAC Level of consciousness: awake and alert Pain management: pain level controlled Vital Signs Assessment: post-procedure vital signs reviewed and stable Respiratory status: spontaneous breathing, nonlabored ventilation, respiratory function stable and patient connected to nasal cannula oxygen Cardiovascular status: stable and blood pressure returned to baseline Postop Assessment: no apparent nausea or vomiting Anesthetic complications: no    Last Vitals:  Vitals:   05/02/19 1931 05/02/19 2000  BP: (!) 137/101   Pulse: 83 82  Resp: 18 11  Temp:    SpO2: 96% 99%    Last Pain:  Vitals:   05/02/19 1800  TempSrc:   PainSc: 7                  Anberlyn Feimster COKER

## 2019-05-02 NOTE — Interval H&P Note (Signed)
History and Physical Interval Note:  05/02/2019 11:14 AM  Angie [name]  has presented today for surgery, with the diagnosis of anemia.  post prandial vomiting..  The various methods of treatment have been discussed with the patient and family. After consideration of risks, benefits and other options for treatment, the patient has consented to  Procedure(s): ESOPHAGOGASTRODUODENOSCOPY (EGD) WITH PROPOFOL (N/A) as a surgical intervention.  The patient's history has been reviewed, patient examined, no change in status, stable for surgery.  I have reviewed the patient's chart and labs.  Questions were answered to the patient's satisfaction.     [name]

## 2019-05-02 NOTE — Progress Notes (Addendum)
PROGRESS NOTE    Angie [name]  [id number] DOB: [birth year] DOA: 04/29/2019 PCP: Harvie Junior, MD   Brief Narrative: 54 year old female with history of gastric bypass surgery prior to which she had a history of diabetes mellitus CKD stage III, chronic anemia, chronic malnutrition with history of esophageal strictures, bipolar disorder and chronic pain, chronic anemia was found unresponsive by patient's boyfriend when he returned back from work after 12-hour shift.  Patient is lying in recliner and was found to be in the 70s.  EMS was called and found her sugar was 23 was given glucagon and D50 and brought to the ER.  To admitting physician patient stated "that she has been feeling increasingly weak over the last 3 weeks with increasing peripheral edema of both lower extremity and difficulty ambulate with patient planning to see a neurologist because of the difficulty ambulating at no incontinence of urine or bowel or any decrease sensation of the extremities.  Patient did have a fall about a week ago when she tripped on a vanity in which fell on her."  In [id number] more alert awake but was hypothermic with temperature of 93 F.  Patient became again hypoglycemic and had to be started on D10.  Blood cultures procalcitonin levels cortisol level TSH was sent.  Patient had CT head and C-spine followed by CT chest and abdomen.  CT head and C-spine were unremarkable.  CT chest and abdomen was showing third spacing of fluid and hepatic steatosis.  Pulmonary nodule also seen.  Covid test negative.  Labs show creatinine 1.5 albumin of 2.4 hemoglobin 7.4 which further decreased to 6.4 WBC was 2.  Patient was started on D10 and admitted for further management of hypothermia hypoglycemia with elevated procalcitonin concerning for infection for which antibiotics were started Patient was admitted-with empiric antibiotics IV fluids and dextrose infusion. Mental status has been lethargic.  Patient on chronic  Xanax, high-dose oxycodone and Seroquel and dose was cut down with improvement in her mental status. Continues to have poor oral intake, 1/5-GI consulted  Subjective:  Overnight noted episode of hypoglycemia at 60 needing D10 Continued on D10 infusion n.p.o. for EGD today. She is more alert awake oriented at baseline.  Asking to go up on her Xanax and off pain medication-but after my extensive discussion agreeable with current lower dose in the setting of severe protein calorie malnutrition and weight loss No episode of hypothermia  Assessment & Plan:   Refractory hypoglycemia:suspecting due to poor nutritional status/severe protein calorie malnutrition with poor oral intake. Random cortisol on admission was 25.7 essentially ruling out adrenalinsufficiency (although she also underwent cosyntropin test).  C-peptide level normal, random insulin level low, proinsulin/insulin ratio,  insulin growth factor pending.  Again hypoglycemic overnight in the setting of n.p.o.  Continue D10 infusion.  After discussion with patient's boyfriend patient has been having issue with swallowing for some time GI was consulted and going for endoscopy today. Patient had to have G-tube placement due to persistent malnutrition in the past.She had GJ revision in 02/2018. I will request GI consultation for recommendations.  Recent Labs  Lab 05/02/19 0310 05/02/19 0354 05/02/19 0507 05/02/19 0552 05/02/19 0625  GLUCAP 102* 75 72 59* 177*  Obtained further history from her fiancee "As per her fiancee since march of last year she has been losing weight, he thinks her esophagus is closing up again. Had appointment with her surgeon this week. Has good appetite but not abel to swallow. She is very fatigued easily with  minimal activity. Only eating soft food and soup. Only little bit of regular food. She started to have leg swelling recently.  Hyponatremia:Likely from poor oral intake.Resolved.On RL.Encourage oral  intake.  Metabolic acidosis with bicarb 10-->13-15->14, cont ivf  Hypothermia: Likely due to her severe malnutrition/hypoglycemia.Rule out sepsis.  Pro Cal was elevated on admission 7.5.  -repeat-procalcitonin pending.  Lactic acid normal.  Started on empiric vancomycin and cefepime on admission. COVID-19 negative blood culture negative from 1/3 so far. UA on admissions negative and CT chest and abdomen no significant finding.  MRCP-no evidence of cholecystitis no CBD dilatation gallbladder with sludge and distended. Deescalate antibiotics if culture data unremarkable.   Severe hepatic steatosis: Noted in MRCP.  CBD 7 mm without intrahepatic biliary dilation. unclear etiology question due to malnutrition.    Distended gallbladder with sludge- monitor lfts. No RUQ tenderness  Moderate bilateral hydronephrosis and hydroureter ordered and out cath x1 and cont as needed  with bladder scan.Watch for refeeding syndrome monitor magnesium potassium and phosphorus daily.  Check renal ultrasound  Macrocytic anemia/folate deficiency:Vitamin B12 high, ferritin 247, iron 59, folate low at less than 4.7-started on iv folate supplementation.  Fecal occult blood was negative.  Patient received 1 unit PRBC for hemoglobin of 6.4 g.  Today 10 g.  Monitor. Recent Labs  Lab 04/29/19 1716 04/29/19 2359 04/30/19 0554 05/01/19 0949 05/02/19 0334  HGB 8.8* 6.4* 9.0* 10.0* 8.8*  HCT 26.0* 19.7* 28.3* 30.0* 26.1*   Leukopenia WBC 1.8K today, ANC 1500.  Monitor.?etiology.  WBC improved improved today Recent Labs  Lab 04/29/19 1546 04/29/19 2359 04/30/19 0554 05/01/19 0949 05/02/19 0334  WBC 2.0*  2.0* 3.2* 5.9 1.8* 5.0   Bilateral lower extremity edema/anasarca/third spacing: Likely in the setting of poor nutrition and hypoalbuminemia  Severe Protein calorie malnutrition: Likely ongoing issues for some time.  Continue dietitian follow-up and augment nutrition with supplementation.  B1 level pending.  Monitor  magnesium phosphorus and labs daily to watch for refeeding syndrome  Generalized weakness/incontinence: Nonfocal on exam, extensive work-up with MRI T, L-spine CT head CT C-spine unremarkable.  Likely in the setting of hyperglycemia/failure to thrive  CKD stage III: Creatinine appears more or less stable stable, downtrending. monitor Recent Labs  Lab 04/29/19 1546 04/29/19 2359 04/30/19 0554 05/01/19 0804 05/02/19 0334  BUN 13 12 12 12 13   CREATININE 1.55* 1.39* 1.54* 1.28* 1.22*   Hypomagnesemia: Improved after replacement monitor daily  S/P gastric bypass history.  Bipolar disorder/anxiety Seroquel and Xanax at home.  Given her lethargy cut down Xanax  To 0.25 mg from 1 mg and Seroquel to 50 from 100 mg.  History of chronic pain on oxycodone-will cut back oxycodone to 5 mg every 6 hours as needed given her lethargy.  Addendum I discussed with Surgeon Dr Donne Hazel after EGD re: GI recs and their input on how we can address her issue and also regarding route of nutrition-He will have surgery take a look in am but he suggests unlikely can have laparoscopic surgery now given her severe malnutrition. Will await surgery recommendations. Pt continues on d10 infusion- wean as Bsugar improves. On soft diet.  Body mass index is 23.55 kg/m.   DVT prophylaxis:lovenox Code Status: full code Family Communication: plan of care discussed with patient at bedside.  I have discussed the case with patient's fianc over the phone.  Disposition Plan: Remains inpatient due to persistent hypoglycemia and further work-up for her dysphagia severe protein calorie malnutrition and failure to thrive   Consultants:GI.  surgery Procedures:  ECHOCARDIOGRAM 1. Status post Roux-en-Y gastric bypass surgery. No stricture or stenosis 2. Tiny gastric remnant. I wonder if gastric volume is so small as to explain her issues with eating difficulties, weight loss, and metabolic abnormalities. Impression: 1. Soft  diet 2. Upper GI series to evaluate gastric remnant size 3. Ask bariatric surgeons to weigh in on her current anatomy to see if she would benefit from reversal of her surgery. This could likely be done endoscopically. 4. We will follow  MRI ABD/MRCP 1. The gallbladder is distended and sludge filled. No discrete calculi are appreciated. The common bile duct measures up to 7 mm in caliber distally to the ampulla, and there no significant intrahepatic biliary ductal dilatation. There is no calculus or other obstructing lesion appreciated within the biliary tract. 2. There is no gallbladder wall thickening or pericholecystic fluid to suggest acute cholecystitis. 3. Severe hepatic steatosis. 4. Moderate bilateral hydronephrosis and hydroureter of the included portions of the ureters with distended urinary bladder seen in the included lower abdomen. Correlate for urinary retention. 5. Pleural effusions, trace ascites, and anasarca.  Mild thoracic spine without contrast: Normal appearing thoracic spine and spinal cord. Negative for stenosis. Small bilateral pleural effusions  MRI lumbar spine without contrast:  Mild lower lumbar spondylosis without central canal or foraminal narrowing. No finding to explain the patient's symptoms  CT chest abdomen pelvis with contrast:Trace left-sided pleural effusion.  No acute airspace disease. 2. Punctate nodules at the apices. No follow-up needed if patient is low-risk (and has no known or suspected primary neoplasm). Non-contrast chest CT can be considered in 12 months if patient is high-risk  TTE:. Left ventricular ejection fraction, by visual estimation, is 50 to 55%. The left ventricle has normal function. There is no left ventricular hypertrophy.  2. The left ventricle has no regional wall motion abnormalities.  3. Global right ventricle has normal systolic function.The right ventricular size is normal. No increase in right ventricular wall thickness.  4.  Left atrial size was normal.  5. Right atrial size was normal.  6. The mitral valve is normal in structure. No evidence of mitral valve regurgitation.  7. The tricuspid valve is normal in structure.  8. The aortic valve is normal in structure. Aortic valve regurgitation is not visualized.  9. The pulmonic valve was normal in structure. Pulmonic valve regurgitation is not visualized. 10. Normal pulmonary artery systolic pressure. 11. The atrial septum is grossly normal  RUQ [place]: Gallbladder distended with sludge present. No gallstones seen; small gallstones could be obscured by sludge. No gallbladder wall thickening or pericholecystic fluid. 2. Biliary duct dilatation. No mass or calculus is appreciable on this study in the biliary ductal system. From an imaging standpoint, MRCP would be the optimum imaging study of choice to further evaluate the biliary ductal system. 3. Complex cystic lesion in the left lobe of the liver measuring 1.6 x 1.4 x 1.0 cm. Underlying hepatic steatosis. 4.  Slight ascites. 5. Prominence of renal pyramids, a finding of uncertain significance  Microbiology:  Antimicrobials: Anti-infectives (From admission, onward)   Start     Dose/Rate Route Frequency Ordered Stop   05/01/19 0300  vancomycin (VANCOREADY) IVPB 500 mg/100 mL     500 mg 100 mL/hr over 60 Minutes Intravenous Every 24 hours 04/30/19 0833     04/30/19 2200  vancomycin (VANCOREADY) IVPB 500 mg/100 mL  Status:  Discontinued     500 mg 100 mL/hr over 60 Minutes Intravenous Every 24 hours  04/30/19 0103 04/30/19 0833   04/30/19 1000  ceFEPIme (MAXIPIME) 2 g in sodium chloride 0.9 % 100 mL IVPB     2 g 200 mL/hr over 30 Minutes Intravenous Every 12 hours 04/30/19 0103     04/29/19 2300  vancomycin (VANCOCIN) IVPB 1000 mg/200 mL premix     1,000 mg 200 mL/hr over 60 Minutes Intravenous  Once 04/29/19 2247 04/30/19 0447   04/29/19 2300  ceFEPIme (MAXIPIME) 2 g in sodium chloride 0.9 % 100 mL IVPB     2 g 200 mL/hr  over 30 Minutes Intravenous  Once 04/29/19 2247 04/30/19 0020       Objective: Vitals:   05/02/19 0200 05/02/19 0334 05/02/19 0336 05/02/19 0500  BP:  (!) 116/93    Pulse: 82     Resp: (!) 7 (!) 23    Temp:   98.1 F (36.7 C)   TempSrc:   Oral   SpO2: 100%     Weight:    54.7 kg    Intake/Output Summary (Last 24 hours) at 05/02/2019 0759 Last data filed at 05/02/2019 0500 Gross per 24 hour  Intake 790 ml  Output 1050 ml  Net -260 ml   Filed Weights   05/01/19 0253 05/02/19 0500  Weight: 54.5 kg 54.7 kg   Weight change: 0.2 kg  Body mass index is 23.55 kg/m.  Intake/Output from previous day: 01/05 0701 - 01/06 0700 In: 790 [I.V.:540; IV Piggyback:250] Out: 1050 [Urine:1050] Intake/Output this shift: No intake/output data recorded.  Examination:  General exam: Alert awake oriented x3, frail, older than stated age, not in acute distress.   HEENT:Oral mucosa moist, Ear/Nose WNL grossly, dentition normal. Respiratory system: Bilaterally clear breath sounds, no wheezing or crackles  Cardiovascular system: S1 & S2 +,No JVD. Gastrointestinal system: Abdomen soft, mildly distended with ascites NT,,BS+ Nervous System:Alert, awake, moving extremities and grossly nonfocal Extremities: Bilateral lower extremity edema present,  distal peripheral pulses palpable.  Skin: No rashes,no icterus. MSK: thin/wasted muscle bulk,tone, power  Medications:  Scheduled Meds: . feeding supplement  1 Container Oral TID BM  . feeding supplement (PRO-STAT SUGAR FREE 64)  30 mL Oral BID  . folic acid  1 mg Intravenous Daily  . OLANZapine  2.5 mg Oral Daily  . oxyCODONE  5 mg Oral Q6H  . pantoprazole  40 mg Oral QHS  . QUEtiapine  50 mg Oral QHS  . thiamine injection  100 mg Intravenous Daily   Continuous Infusions: . ceFEPime (MAXIPIME) IV 2 g (05/01/19 2330)  . dextrose 50 mL/hr at 05/01/19 0156  . lactated ringers 50 mL/hr at 04/30/19 1143  . vancomycin 500 mg (05/02/19 0340)     Data Reviewed: I have personally reviewed following labs and imaging studies  CBC: Recent Labs  Lab 04/29/19 1546 04/29/19 1716 04/29/19 2359 04/30/19 0554 05/01/19 0949 05/02/19 0334  WBC 2.0*  2.0*  --  3.2* 5.9 1.8* 5.0  NEUTROABS 1.3*  --  2.4 4.1 1.5*  --   HGB 7.4*  7.4* 8.8* 6.4* 9.0* 10.0* 8.8*  HCT 24.7*  24.6* 26.0* 19.7* 28.3* 30.0* 26.1*  MCV 116.0*  116.0*  --  107.1* 105.2* 99.7 99.6  PLT 186  175  --  210 179 167 [id number]   Basic Metabolic Panel: Recent Labs  Lab 04/29/19 1546 04/29/19 1650 04/29/19 1716 04/29/19 2359 04/30/19 0554 05/01/19 0804 05/02/19 0334  NA 141  --  142 135 138 137 140  K 4.2  --  3.6 3.6  3.8 4.2 4.1  CL 119*  --   --  113* 112* 111 117*  CO2 10*  --   --  15* 13* 16* 14*  GLUCOSE 100*  --   --  191* 122* 237* 89  BUN 13  --   --  12 12 12 13   CREATININE 1.55*  --   --  1.39* 1.54* 1.28* 1.22*  CALCIUM 7.7*  --   --  7.6* 7.7* 7.8* 8.2*  MG  --  1.8  --   --   --  1.5* 2.1  PHOS  --  4.3  --   --   --   --  4.0   GFR: CrCl cannot be calculated (Unknown ideal weight.). Liver Function Tests: Recent Labs  Lab 04/29/19 1650 04/29/19 2359 04/30/19 0554 05/01/19 0804 05/02/19 0334  AST 53* 36 39 31 29  ALT 32 26 26 29 28   ALKPHOS 213* 173* 162* 165* 138*  BILITOT 1.0 0.7 1.2 0.8 0.9  PROT 5.8* 4.7* 4.8* 5.0* 4.9*  ALBUMIN 2.4* 2.0* 2.0* 2.1* 1.9*   Recent Labs  Lab 04/29/19 1650  LIPASE 13   Recent Labs  Lab 04/29/19 1650  AMMONIA 38*   Coagulation Profile: Recent Labs  Lab 05/01/19 0949  INR 1.4*   Cardiac Enzymes: Recent Labs  Lab 04/29/19 2359  CKTOTAL 120   BNP (last 3 results) No results for input(s): PROBNP in the last 8760 hours. HbA1C: Recent Labs    04/30/19 0554  HGBA1C 3.8*   CBG: Recent Labs  Lab 05/02/19 0310 05/02/19 0354 05/02/19 0507 05/02/19 0552 05/02/19 0625  GLUCAP 102* 75 72 59* 177*   Lipid Profile: Recent Labs    04/29/19 1650  TRIG 104   Thyroid Function  Tests: Recent Labs    04/29/19 1650  TSH 1.623   Anemia Panel: Recent Labs    04/29/19 1650 04/29/19 2359 04/30/19 0554  VITAMINB12  --  2,423*  --   FOLATE  --  4.7*  --   FERRITIN 320*  --  247  TIBC  --  <70*  --   IRON  --  59  --   RETICCTPCT  --  4.1*  --    Sepsis Labs: Recent Labs  Lab 04/29/19 1650  PROCALCITON 7.56  LATICACIDVEN 1.0    Recent Results (from the past 240 hour(s))  Blood culture (routine x 2)     Status: None (Preliminary result)   Collection Time: 04/29/19  4:20 PM   Specimen: Right Antecubital; Blood  Result Value Ref Range Status   Specimen Description RIGHT ANTECUBITAL  Final   Special Requests   Final    BOTTLES DRAWN AEROBIC AND ANAEROBIC Blood Culture adequate volume   Culture   Final    NO GROWTH 2 DAYS Performed at [facility], [place] [address], [place], [place] [address]    Report Status PENDING  Incomplete  Culture, Urine     Status: None   Collection Time: 04/29/19  5:15 PM   Specimen: Urine, Random  Result Value Ref Range Status   Specimen Description URINE, RANDOM  Final   Special Requests NONE  Final   Culture   Final    NO GROWTH Performed at [facility], [place] [address], [place], [place] [address]    Report Status 05/01/2019 FINAL  Final  SARS CORONAVIRUS 2 (TAT 6-24 HRS) Nasopharyngeal Nasopharyngeal Swab     Status: None   Collection Time: 04/29/19  8:23 PM   Specimen: Nasopharyngeal Swab  Result Value Ref Range Status   SARS Coronavirus 2 NEGATIVE NEGATIVE Final    Comment: (NOTE) SARS-CoV-2 target nucleic acids are NOT DETECTED. The SARS-CoV-2 RNA is generally detectable in upper and lower respiratory specimens during the acute phase of infection. Negative results do not preclude SARS-CoV-2 infection, do not rule out co-infections with other pathogens, and should not be used as the sole basis for treatment or other patient management decisions. Negative results must be combined with clinical  observations, patient history, and epidemiological information. The expected result is Negative. Fact Sheet for Patients: SugarRoll.be Fact Sheet for Healthcare Providers: https://www.woods-mathews.com/ This test is not yet approved or cleared by the [place] FDA and  has been authorized for detection and/or diagnosis of SARS-CoV-2 by FDA under an Emergency Use Authorization (EUA). This EUA will remain  in effect (meaning this test can be used) for the duration of the COVID-19 declaration under Section 56 4(b)(1) of the Act, 21 U.S.C. section 360bbb-3(b)(1), unless the authorization is terminated or revoked sooner. Performed at [facility], [place] [address], [place], [place] [address]   Blood culture (routine x 2)     Status: None (Preliminary result)   Collection Time: 04/29/19 11:09 PM   Specimen: BLOOD  Result Value Ref Range Status   Specimen Description BLOOD LEFT ANTECUBITAL  Final   Special Requests   Final    BOTTLES DRAWN AEROBIC AND ANAEROBIC Blood Culture adequate volume   Culture   Final    NO GROWTH 1 DAY Performed at [facility], [place] [address] [place], [place] [address]    Report Status PENDING  Incomplete  MRSA PCR Screening     Status: None   Collection Time: 04/30/19  5:31 PM   Specimen: Nasopharyngeal  Result Value Ref Range Status   MRSA by PCR NEGATIVE NEGATIVE Final    Comment:        The GeneXpert MRSA Assay (FDA approved for NASAL specimens only), is one component of a comprehensive MRSA colonization surveillance program. It is not intended to diagnose MRSA infection nor to guide or monitor treatment for MRSA infections. Performed at [facility], [place] [address] [place], [place] [address]       Radiology Studies: MR THORACIC SPINE WO CONTRAST  Result Date: 04/30/2019 CLINICAL DATA:  Increasing weakness over the past 3 weeks with edema in both lower extremities and difficulty  walking. No known injury. EXAM: MRI THORACIC SPINE WITHOUT CONTRAST TECHNIQUE: Multiplanar, multisequence MR imaging of the thoracic spine was performed. No intravenous contrast was administered. COMPARISON:  Plain films thoracic spine 07/20/2014. FINDINGS: Alignment:  Normal. Vertebrae: No fracture, evidence of discitis, or bone lesion. Cord:  Normal signal and morphology. Paraspinal and other soft tissues: Small bilateral pleural effusions are seen. Disc levels: The central spinal canal and neural foramina are widely patent at all levels. Minimal disc bulging at T9-10 is noted. IMPRESSION: Normal appearing thoracic spine and spinal cord. Negative for stenosis. Small bilateral pleural effusions. Electronically Signed   By: Inge Rise M.D.   On: 04/30/2019 08:49   MR LUMBAR SPINE WO CONTRAST  Result Date: 04/30/2019 CLINICAL DATA:  Increasing weakness over the past 3 weeks with bilateral lower extremity edema and difficulty walking. EXAM: MRI LUMBAR SPINE WITHOUT CONTRAST TECHNIQUE: Multiplanar, multisequence MR imaging of the lumbar spine was performed. No intravenous contrast was administered. COMPARISON:  None. FINDINGS: Segmentation:  Standard. Alignment:  Normal. Vertebrae:  No fracture, evidence of discitis, or bone lesion. Conus medullaris and cauda equina: Conus extends to the L2 level. Conus and cauda equina appear normal. Paraspinal and other soft tissues: Negative. Disc levels: The central spinal canal and neural foramina are widely patent at all levels. Very shallow disc bulge is seen at L4-5. Mild-to-moderate facet degenerative disease is seen at L4-5 and L5-S1. IMPRESSION: Mild lower lumbar spondylosis without central canal or foraminal narrowing. No finding to explain the patient's symptoms. Electronically Signed   By: Inge Rise M.D.   On: 04/30/2019 08:51   MR 3D Recon At Scanner  Result Date: 05/01/2019 CLINICAL DATA:  Biliary colic, distended gallbladder with gall sludge by prior  CT and ultrasound EXAM: MRI ABDOMEN WITHOUT AND WITH CONTRAST (INCLUDING MRCP) TECHNIQUE: Multiplanar multisequence MR imaging of the abdomen was performed both before and after the administration of intravenous contrast. Heavily T2-weighted images of the biliary and pancreatic ducts were obtained, and three-dimensional MRCP images were rendered by post processing. CONTRAST:  5.23mL GADAVIST GADOBUTROL 1 MMOL/ML IV SOLN COMPARISON:  None. FINDINGS: Lower chest: Trace bilateral pleural effusions. Hepatobiliary: No hepatic mass. Severe hepatic steatosis. The gallbladder is distended and sludge filled. No discrete calculi are appreciated. The common bile duct measures up to 7 mm in caliber distally to the ampulla, and there no significant intrahepatic biliary ductal dilatation. There is no calculus or other obstructing lesion appreciated within the biliary tract. Pancreas: No mass, inflammatory changes, or other parenchymal abnormality identified. Spleen:  Within normal limits in size and appearance. Adrenals/Urinary Tract: No masses identified. Moderate bilateral hydronephrosis and hydroureter of the included portions of the ureters with a distended urinary bladder seen in the included lower abdomen. Stomach/Bowel: Visualized portions within the abdomen are unremarkable. Vascular/Lymphatic: No pathologically enlarged lymph nodes identified. No abdominal aortic aneurysm demonstrated. Other:  Anasarca. Trace ascites. Musculoskeletal: No suspicious bone lesions identified. IMPRESSION: 1. The gallbladder is distended and sludge filled. No discrete calculi are appreciated. The common bile duct measures up to 7 mm in caliber distally to the ampulla, and there no significant intrahepatic biliary ductal dilatation. There is no calculus or other obstructing lesion appreciated within the biliary tract. 2. There is no gallbladder wall thickening or pericholecystic fluid to suggest acute cholecystitis. 3. Severe hepatic steatosis.  4. Moderate bilateral hydronephrosis and hydroureter of the included portions of the ureters with distended urinary bladder seen in the included lower abdomen. Correlate for urinary retention. 5. Pleural effusions, trace ascites, and anasarca. Electronically Signed   By: Eddie Candle M.D.   On: 05/01/2019 08:28   MR ABDOMEN MRCP W WO CONTAST  Result Date: 05/01/2019 CLINICAL DATA:  Biliary colic, distended gallbladder with gall sludge by prior CT and ultrasound EXAM: MRI ABDOMEN WITHOUT AND WITH CONTRAST (INCLUDING MRCP) TECHNIQUE: Multiplanar multisequence MR imaging of the abdomen was performed both before and after the administration of intravenous contrast. Heavily T2-weighted images of the biliary and pancreatic ducts were obtained, and three-dimensional MRCP images were rendered by post processing. CONTRAST:  5.64mL GADAVIST GADOBUTROL 1 MMOL/ML IV SOLN COMPARISON:  None. FINDINGS: Lower chest: Trace bilateral pleural effusions. Hepatobiliary: No hepatic mass. Severe hepatic steatosis. The gallbladder is distended and sludge filled. No discrete calculi are appreciated. The common bile duct measures up to 7 mm in caliber distally to the ampulla, and there no significant intrahepatic biliary ductal dilatation. There is no calculus or other obstructing lesion appreciated within the biliary tract. Pancreas: No mass, inflammatory changes, or other parenchymal abnormality identified. Spleen:  Within normal limits in size and appearance. Adrenals/Urinary Tract: No masses identified. Moderate bilateral hydronephrosis and hydroureter of the included portions of the ureters with a distended urinary bladder seen in the included lower abdomen. Stomach/Bowel: Visualized portions within the abdomen are unremarkable. Vascular/Lymphatic: No pathologically enlarged lymph nodes identified. No abdominal aortic aneurysm demonstrated. Other:  Anasarca. Trace ascites. Musculoskeletal: No suspicious bone lesions identified.  IMPRESSION: 1. The gallbladder is distended and sludge filled. No discrete calculi are appreciated. The common bile duct measures up to 7 mm in caliber distally to the ampulla, and there no significant intrahepatic biliary ductal dilatation. There is no calculus or other obstructing lesion appreciated within the biliary tract. 2. There is no gallbladder wall thickening or pericholecystic fluid to suggest acute cholecystitis. 3. Severe hepatic steatosis. 4. Moderate bilateral hydronephrosis and hydroureter of the included portions of the ureters with distended urinary bladder seen in the included lower abdomen. Correlate for urinary retention. 5. Pleural effusions, trace ascites, and anasarca. Electronically Signed   By: Eddie Candle M.D.   On: 05/01/2019 08:28   ECHOCARDIOGRAM COMPLETE  Result Date: 04/30/2019   ECHOCARDIOGRAM REPORT   Patient Name:   NOON SIPP Nucci Date of Exam: 04/30/2019 Medical Rec #:  [id number]       Height:       60.0 in Accession #:    [id number]      Weight:       127.4 lb Date of Birth:  [birth year]       BSA:          1.54 m Patient Age:    46 years        BP:           101/73 mmHg Patient Gender: F               HR:           82 bpm. 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 Risk Factors:Former Smoker. ETOH abuse.  Sonographer:    Clayton Lefort RDCS (AE) Referring Phys: [place]  1. Left ventricular ejection fraction, by visual estimation, is 50 to 55%. The left ventricle has normal function. There is no left ventricular hypertrophy.  2. The left ventricle has no regional wall motion abnormalities.  3. Global right ventricle has normal systolic function.The right ventricular size is normal. No increase in right ventricular wall thickness.  4. Left atrial size was normal.  5. Right atrial size was  normal.  6. The mitral valve is normal in structure. No evidence of mitral valve regurgitation.  7. The tricuspid valve is normal in structure.  8. The aortic valve is normal in structure. Aortic valve regurgitation is not visualized.  9. The pulmonic valve was normal in structure. Pulmonic valve regurgitation is not visualized. 10. Normal pulmonary artery systolic pressure. 11. The atrial septum is grossly normal. FINDINGS  Left Ventricle: Left ventricular ejection fraction, by visual estimation, is 50 to 55%. The left ventricle has normal function. The left ventricle has no regional wall motion abnormalities. There is no left ventricular hypertrophy. Right Ventricle: The right ventricular size is normal. No increase in right ventricular wall thickness. Global RV systolic function is has normal systolic function. The tricuspid  regurgitant velocity is 1.91 m/s, and with an assumed right atrial pressure  of 3 mmHg, the estimated right ventricular systolic pressure is normal at 17.5 mmHg. Left Atrium: Left atrial size was normal in size. Right Atrium: Right atrial size was normal in size Pericardium: There is no evidence of pericardial effusion. Mitral Valve: The mitral valve is normal in structure. No evidence of mitral valve regurgitation. MV peak gradient, 2.0 mmHg. Tricuspid Valve: The tricuspid valve is normal in structure. Tricuspid valve regurgitation is trivial. Aortic Valve: The aortic valve is normal in structure. Aortic valve regurgitation is not visualized. Aortic valve mean gradient measures 2.0 mmHg. Aortic valve peak gradient measures 4.2 mmHg. Aortic valve area, by VTI measures 2.45 cm. Pulmonic Valve: The pulmonic valve was normal in structure. Pulmonic valve regurgitation is not visualized. Pulmonic regurgitation is not visualized. Aorta: The aortic root and ascending aorta are structurally normal, with no evidence of dilitation. IAS/Shunts: The atrial septum is grossly normal.  LEFT VENTRICLE PLAX  2D LVIDd:         3.70 cm  Diastology LVIDs:         2.50 cm  LV e' lateral:   10.00 cm/s LV PW:         0.90 cm  LV E/e' lateral: 5.6 LV IVS:        0.90 cm  LV e' medial:    7.29 cm/s LVOT diam:     1.90 cm  LV E/e' medial:  7.6 LV SV:         36 ml LV SV Index:   22.70 LVOT Area:     2.84 cm  RIGHT VENTRICLE             IVC RV Basal diam:  3.00 cm     IVC diam: 1.40 cm RV S prime:     10.00 cm/s TAPSE (M-mode): 2.0 cm LEFT ATRIUM             Index       RIGHT ATRIUM           Index LA diam:        2.70 cm 1.75 cm/m  RA Area:     10.00 cm LA Vol (A2C):   32.9 ml 21.35 ml/m RA Volume:   20.20 ml  13.11 ml/m LA Vol (A4C):   26.8 ml 17.39 ml/m LA Biplane Vol: 30.7 ml 19.92 ml/m  AORTIC VALVE AV Area (Vmax):    2.27 cm AV Area (Vmean):   2.15 cm AV Area (VTI):     2.45 cm AV Vmax:           102.00 cm/s AV Vmean:          72.700 cm/s AV VTI:            0.178 m AV Peak Grad:      4.2 mmHg AV Mean Grad:      2.0 mmHg LVOT Vmax:         81.80 cm/s LVOT Vmean:        55.100 cm/s LVOT VTI:          0.154 m LVOT/AV VTI ratio: 0.87  AORTA Ao Root diam: 2.50 cm MITRAL VALVE                        TRICUSPID VALVE MV Area (PHT): 3.27 cm             TR Peak grad:   14.5  mmHg MV Peak grad:  2.0 mmHg             TR Vmax:        202.00 cm/s MV Mean grad:  1.0 mmHg MV Vmax:       0.71 m/s             SHUNTS MV Vmean:      45.8 cm/s            Systemic VTI:  0.15 m MV VTI:        0.18 m               Systemic Diam: 1.90 cm MV PHT:        67.28 msec MV Decel Time: 232 msec MV E velocity: 55.60 cm/s 103 cm/s MV A velocity: 53.70 cm/s 70.3 cm/s MV E/A ratio:  1.04       1.5  Mertie Moores MD Electronically signed by Mertie Moores MD Signature Date/Time: 04/30/2019/4:21:25 PM    Final    US Abdomen Limited RUQ  Result Date: 04/30/2019 CLINICAL DATA:  Nausea and vomiting EXAM: ULTRASOUND ABDOMEN LIMITED RIGHT UPPER QUADRANT COMPARISON:  CT abdomen and pelvis April 30, 2019 FINDINGS: Gallbladder: Gallbladder appears mildly  distended, similar to CT examination with sludge within the gallbladder. Inspissated sludge is noted posteriorly. No gallstones are evident. No gallbladder wall thickening or pericholecystic fluid. No sonographic Murphy sign noted by sonographer. Common bile duct: Diameter: 8 mm, prominent. No biliary duct mass or calculus evident. There appears to be mild intrahepatic biliary duct dilatation. Liver: There is a focal cystic area with septation in the left lobe of the liver measuring 1.6 x 1.4 x 1.0 cm. Liver echogenicity is diffusely increased. Portal vein is patent on color Doppler imaging with normal direction of blood flow towards the liver. Other: There is mild ascites adjacent to the liver. The pyramids of the right kidney appear rather prominent. No obstructing focus in the right kidney. IMPRESSION: 1. Gallbladder distended with sludge present. No gallstones seen; small gallstones could be obscured by sludge. No gallbladder wall thickening or pericholecystic fluid. 2. Biliary duct dilatation. No mass or calculus is appreciable on this study in the biliary ductal system. From an imaging standpoint, MRCP would be the optimum imaging study of choice to further evaluate the biliary ductal system. 3. Complex cystic lesion in the left lobe of the liver measuring 1.6 x 1.4 x 1.0 cm. Underlying hepatic steatosis. 4.  Slight ascites. 5. Prominence of renal pyramids, a finding of uncertain significance. No right renal lesions seen on CT examination performed earlier in the day. Electronically Signed   By: Lowella Grip III M.D.   On: 04/30/2019 09:35      LOS: 2 days   Time spent: More than 50% of that time was spent in counseling and/or coordination of care.  Antonieta Pert, MD [facility]  05/02/2019, 7:59 AM

## 2019-05-02 NOTE — Progress Notes (Signed)
Patient transported to endo with transport at this time.  No s/s of distress noted

## 2019-05-02 NOTE — Progress Notes (Signed)
Hypoglycemic Event  CBG: 59 mg/dL  Treatment: D50 25 mL (12.5 gm)  Symptoms: None  Follow-up CBG: Time 06:25  CBG Result: 177 mg/dL2  Possible Reasons for Event: Inadequate meal intake  Comments/MD notified:Danny NP    Tad Moore

## 2019-05-02 NOTE — Progress Notes (Signed)
Patient returned to unit at this time.  Remains a little drowsy.  Ambulated from stretcher to bed with one assist.  No complaints of pain or discomfort at this time.

## 2019-05-02 NOTE — Anesthesia Procedure Notes (Signed)
Procedure Name: MAC Date/Time: 05/02/2019 11:29 AM Performed by: Kathryne Hitch, CRNA Pre-anesthesia Checklist: Patient identified, Emergency Drugs available, Suction available and Patient being monitored Patient Re-evaluated:Patient Re-evaluated prior to induction Oxygen Delivery Method: Nasal cannula Preoxygenation: Pre-oxygenation with 100% oxygen Induction Type: IV induction Dental Injury: Teeth and Oropharynx as per pre-operative assessment

## 2019-05-02 NOTE — Progress Notes (Signed)
Hypoglycemic Event  CBG: 55 mg/dL  Treatment: 8 oz juice/soda  Symptoms: None  Follow-up CBG: Time:2125 CBG Result:85  Possible Reasons for Event: Inadequate meal intake  Comments/MD notified:Danny NP    Tad Moore

## 2019-05-02 NOTE — Plan of Care (Signed)
  Problem: Education: Goal: Knowledge of General Education information will improve Description: Including pain rating scale, medication(s)/side effects and non-pharmacologic comfort measures Outcome: Progressing   Problem: Health Behavior/Discharge Planning: Goal: Ability to manage health-related needs will improve Outcome: Progressing   Problem: Clinical Measurements: Goal: Will remain free from infection Outcome: Progressing Goal: Respiratory complications will improve Outcome: Progressing Goal: Cardiovascular complication will be avoided Outcome: Progressing   Problem: Activity: Goal: Risk for activity intolerance will decrease Outcome: Progressing   Problem: Coping: Goal: Level of anxiety will decrease Outcome: Progressing   Problem: Elimination: Goal: Will not experience complications related to bowel motility Outcome: Progressing Goal: Will not experience complications related to urinary retention Outcome: Progressing   Problem: Pain Managment: Goal: General experience of comfort will improve Outcome: Progressing   Problem: Safety: Goal: Ability to remain free from injury will improve Outcome: Progressing   Problem: Skin Integrity: Goal: Risk for impaired skin integrity will decrease Outcome: Progressing   Problem: Clinical Measurements: Goal: Ability to maintain clinical measurements within normal limits will improve Outcome: Not Progressing Goal: Diagnostic test results will improve Outcome: Not Progressing   Problem: Nutrition: Goal: Adequate nutrition will be maintained Outcome: Not Progressing

## 2019-05-03 ENCOUNTER — Inpatient Hospital Stay (HOSPITAL_COMMUNITY): Payer: Medicare Other

## 2019-05-03 DIAGNOSIS — R627 Adult failure to thrive: Secondary | ICD-10-CM

## 2019-05-03 DIAGNOSIS — Z9884 Bariatric surgery status: Secondary | ICD-10-CM

## 2019-05-03 LAB — CBC
HCT: 23.7 % — ABNORMAL LOW (ref 36.0–46.0)
Hemoglobin: 8 g/dL — ABNORMAL LOW (ref 12.0–15.0)
MCH: 33.8 pg (ref 26.0–34.0)
MCHC: 33.8 g/dL (ref 30.0–36.0)
MCV: 100 fL (ref 80.0–100.0)
Platelets: 136 10*3/uL — ABNORMAL LOW (ref 150–400)
RBC: 2.37 MIL/uL — ABNORMAL LOW (ref 3.87–5.11)
RDW: 18.8 % — ABNORMAL HIGH (ref 11.5–15.5)
WBC: 4.4 10*3/uL (ref 4.0–10.5)
nRBC: 0 % (ref 0.0–0.2)

## 2019-05-03 LAB — COMPREHENSIVE METABOLIC PANEL
ALT: 25 U/L (ref 0–44)
AST: 23 U/L (ref 15–41)
Albumin: 1.7 g/dL — ABNORMAL LOW (ref 3.5–5.0)
Alkaline Phosphatase: 109 U/L (ref 38–126)
Anion gap: 8 (ref 5–15)
BUN: 16 mg/dL (ref 6–20)
CO2: 15 mmol/L — ABNORMAL LOW (ref 22–32)
Calcium: 7.9 mg/dL — ABNORMAL LOW (ref 8.9–10.3)
Chloride: 117 mmol/L — ABNORMAL HIGH (ref 98–111)
Creatinine, Ser: 1.12 mg/dL — ABNORMAL HIGH (ref 0.44–1.00)
GFR calc Af Amer: >60 mL/min (ref >60)
GFR calc non Af Amer: 56 mL/min — ABNORMAL LOW (ref >60)
Glucose, Bld: 283 mg/dL — ABNORMAL HIGH (ref 70–99)
Potassium: 3.3 mmol/L — ABNORMAL LOW (ref 3.5–5.1)
Sodium: 140 mmol/L (ref 135–145)
Total Bilirubin: 1 mg/dL (ref 0.3–1.2)
Total Protein: 4.4 g/dL — ABNORMAL LOW (ref 6.5–8.1)

## 2019-05-03 LAB — GLUCOSE, CAPILLARY
Glucose-Capillary: 100 mg/dL — ABNORMAL HIGH (ref 70–99)
Glucose-Capillary: 126 mg/dL — ABNORMAL HIGH (ref 70–99)
Glucose-Capillary: 128 mg/dL — ABNORMAL HIGH (ref 70–99)
Glucose-Capillary: 134 mg/dL — ABNORMAL HIGH (ref 70–99)
Glucose-Capillary: 137 mg/dL — ABNORMAL HIGH (ref 70–99)
Glucose-Capillary: 142 mg/dL — ABNORMAL HIGH (ref 70–99)
Glucose-Capillary: 162 mg/dL — ABNORMAL HIGH (ref 70–99)
Glucose-Capillary: 252 mg/dL — ABNORMAL HIGH (ref 70–99)
Glucose-Capillary: 63 mg/dL — ABNORMAL LOW (ref 70–99)
Glucose-Capillary: 67 mg/dL — ABNORMAL LOW (ref 70–99)
Glucose-Capillary: 81 mg/dL (ref 70–99)
Glucose-Capillary: 84 mg/dL (ref 70–99)
Glucose-Capillary: 86 mg/dL (ref 70–99)
Glucose-Capillary: 87 mg/dL (ref 70–99)
Glucose-Capillary: 92 mg/dL (ref 70–99)
Glucose-Capillary: 99 mg/dL (ref 70–99)

## 2019-05-03 LAB — PHOSPHORUS: Phosphorus: 3 mg/dL (ref 2.5–4.6)

## 2019-05-03 LAB — MAGNESIUM: Magnesium: 2 mg/dL (ref 1.7–2.4)

## 2019-05-03 LAB — VITAMIN B12: Vitamin B-12: >7500 pg/mL — ABNORMAL HIGH (ref 180–914)

## 2019-05-03 LAB — PROCALCITONIN: Procalcitonin: 0.99 ng/mL

## 2019-05-03 MED ORDER — CYANOCOBALAMIN 1000 MCG/ML IJ SOLN
1000.0000 ug | Freq: Once | INTRAMUSCULAR | Status: AC
  Administered 2019-05-03: 1000 ug via SUBCUTANEOUS
  Filled 2019-05-03: qty 1

## 2019-05-03 MED ORDER — LOPERAMIDE HCL 2 MG PO CAPS
2.0000 mg | ORAL_CAPSULE | Freq: Four times a day (QID) | ORAL | Status: DC | PRN
  Administered 2019-05-03: 2 mg via ORAL
  Filled 2019-05-03: qty 1

## 2019-05-03 MED ORDER — OXYCODONE HCL 5 MG PO TABS
7.5000 mg | ORAL_TABLET | Freq: Four times a day (QID) | ORAL | Status: DC
  Administered 2019-05-03 – 2019-05-05 (×7): 7.5 mg via ORAL
  Filled 2019-05-03 (×7): qty 2

## 2019-05-03 MED ORDER — LOPERAMIDE HCL 2 MG PO CAPS
ORAL_CAPSULE | ORAL | Status: AC
  Filled 2019-05-03: qty 1

## 2019-05-03 MED ORDER — POTASSIUM CHLORIDE 10 MEQ/100ML IV SOLN
10.0000 meq | INTRAVENOUS | Status: AC
  Administered 2019-05-03 (×2): 10 meq via INTRAVENOUS
  Filled 2019-05-03 (×2): qty 100

## 2019-05-03 MED ORDER — DEXTROSE 50 % IV SOLN
INTRAVENOUS | Status: AC
  Administered 2019-05-03: 50 mL
  Filled 2019-05-03: qty 50

## 2019-05-03 MED ORDER — LOPERAMIDE HCL 2 MG PO CAPS
4.0000 mg | ORAL_CAPSULE | Freq: Once | ORAL | Status: AC
  Administered 2019-05-03: 4 mg via ORAL

## 2019-05-03 MED ORDER — INFLUENZA VAC SPLIT QUAD 0.5 ML IM SUSY: 0.5000 mL | PREFILLED_SYRINGE | INTRAMUSCULAR | Status: DC

## 2019-05-03 MED ORDER — DEXTROSE 50 % IV SOLN
12.5000 g | INTRAVENOUS | Status: AC
  Administered 2019-05-03: 12.5 g via INTRAVENOUS
  Filled 2019-05-03: qty 50

## 2019-05-03 MED ORDER — POTASSIUM CHLORIDE CRYS ER 20 MEQ PO TBCR
40.0000 meq | EXTENDED_RELEASE_TABLET | Freq: Once | ORAL | Status: AC
  Administered 2019-05-03: 40 meq via ORAL
  Filled 2019-05-03: qty 2

## 2019-05-03 NOTE — Progress Notes (Addendum)
Daily Rounding Note  05/03/2019, 11:05 AM  LOS: 3 days   SUBJECTIVE:   Chief complaint: vomiting, wt loss, ftt Pt feels ok but having frequent loose stools x 24 hours. No vomiting  OBJECTIVE:         Vital signs in last 24 hours:    Temp:  [97 F (36.1 C)-97.5 F (36.4 C)] 97.5 F (36.4 C) (01/07 0818) Pulse Rate:  [74-102] 86 (01/07 0818) Resp:  [9-18] 17 (01/07 0818) BP: (95-158)/(71-103) 119/88 (01/07 0818) SpO2:  [96 %-100 %] 100 % (01/07 0818) Weight:  [57.1 kg] 57.1 kg (01/07 0406) Last BM Date: 05/03/19 Filed Weights   05/02/19 0500 05/02/19 1031 05/03/19 0406  Weight: 54.7 kg 54.7 kg 57.1 kg   General: thin, malnourished   Heart: RRR Chest: clear bil.   Abdomen: soft, thin, NT.  Active BS  Extremities: no CCE Neuro/Psych:  Oriented x 3.  Alert.  No tremors or gross deficits.    Intake/Output from previous day: 01/06 0701 - 01/07 0700 In: 1916.2 [I.V.:1816.2; IV Piggyback:100] Out: 350 [Urine:350]  Intake/Output this shift: No intake/output data recorded.  Lab Results: Recent Labs    05/01/19 0949 05/02/19 0334 05/03/19 0455  WBC 1.8* 5.0 4.4  HGB 10.0* 8.8* 8.0*  HCT 30.0* 26.1* 23.7*  PLT 167 165 136*   BMET Recent Labs    05/01/19 0804 05/02/19 0334 05/03/19 0455  NA 137 140 140  K 4.2 4.1 3.3*  CL 111 117* 117*  CO2 16* 14* 15*  GLUCOSE 237* 89 283*  BUN 12 13 16   CREATININE 1.28* 1.22* 1.12*  CALCIUM 7.8* 8.2* 7.9*   LFT Recent Labs    05/01/19 0804 05/02/19 0334 05/03/19 0455  PROT 5.0* 4.9* 4.4*  ALBUMIN 2.1* 1.9* 1.7*  AST 31 29 23   ALT 29 28 25   ALKPHOS 165* 138* 109  BILITOT 0.8 0.9 1.0   PT/INR Recent Labs    05/01/19 0949  LABPROT 16.7*  INR 1.4*   Hepatitis Panel No results for input(s): HEPBSAG, HCVAB, HEPAIGM, HEPBIGM in the last 72 hours.  Studies/Results: DG UGI W SINGLE CM (SOL OR THIN BA)  Result Date: 05/03/2019 CLINICAL DATA:  Remote  Roux-en-Y gastric bypass. Nausea and vomiting. Evaluate pouch and emptying. EXAM: UPPER GI SERIES WITH KUB TECHNIQUE: After obtaining a scout radiograph a routine upper GI series was performed using thin barium FLUOROSCOPY TIME:  Fluoroscopy Time:  2 minutes 24 seconds Radiation Exposure Index (if provided by the fluoroscopic device): Number of Acquired Spot Images: 0 COMPARISON:  CT 04/30/2019 FINDINGS: Fluoroscopic evaluation of swallowing demonstrates poor esophageal peristalsis with full column stasis and slow emptying of the esophagus. No fixed stricture. There is a very small gastric pouch with immediate emptying of the pouch in to small bowel. Small bowel is normal caliber. IMPRESSION: Very small pouch with immediate emptying into small bowel. Slow clearance of the esophagus with esophageal dysmotility. No fixed stricture. Electronically Signed   By: Rolm Baptise M.D.   On: 05/03/2019 09:22   Scheduled Meds: . feeding supplement  1 Container Oral TID BM  . feeding supplement (PRO-STAT SUGAR FREE 64)  30 mL Oral BID  . folic acid  1 mg Intravenous Daily  . [START ON 05/04/2019] influenza vac split quadrivalent PF  0.5 mL Intramuscular Tomorrow-1000  . OLANZapine  2.5 mg Oral Daily  . oxyCODONE  7.5 mg Oral Q6H  . pantoprazole  40 mg Oral QHS  .  QUEtiapine  50 mg Oral QHS  . thiamine injection  100 mg Intravenous Daily   Continuous Infusions: . ceFEPime (MAXIPIME) IV 2 g (05/02/19 2253)  . dextrose 75 mL/hr at 05/03/19 0531  . lactated ringers Stopped (05/02/19 1216)   PRN Meds:.acetaminophen **OR** acetaminophen, ALPRAZolam, ondansetron **OR** ondansetron (ZOFRAN) IV  ASSESMENT:   *   Postprandial vomiting, dysphagia.  FTT, weight loss, metabolic abnormalities with labile blood sugars/severe hypoglycemia. 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  Tiny gastric remnant suspect gastric remnant 2 small to allow for proper nutritional absorption and is causing her  difficulties with metabolic abnormalities, weight loss, feeding problems. 05/03/2019 upper GI series confirms very small gastric remnant which immediately empties into the small intestine.  Additionally esophageal dysmotility present with slow esophageal clearance, no stricture.  *     Hepatic steatosis.  Elevated LFTs.  Gallbladder sludge, prominent CBD, cystic lesion in LLL liver.  *   Sepsis.  On Maxipime.   *   Anemia.  Folate deficient, on supplement     PLAN   *   Case d/w Dr. Henrene Pastor and will ask bariatric surgeon to weigh in on possibility of reversing the Roux-en-Y.  Dr Kieth Brightly consulted.    Azucena Freed  05/03/2019, 11:05 AM Phone [phone]  GI ATTENDING  Interval history data reviewed.  Agree with above progress note. Upper GI series reviewed.  The patient has essentially a small gastric cuff and widely patent gastroenteric anastomosis.  I suspect that her difficulties with eating and dumping type symptoms may have led to her malnourished state.  I discussed with Dr. Rush Landmark, with our group, and Dr. Lysle Rubens at [facility] (both advanced therapeutic endoscopists) possibility of endoscopic reversal of her gastric bypass surgery.  Of course this could be done surgically as well.  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  Also, we would want to be certain that this is indeed the origin of her problems before getting aggressive with interventional therapy.  It might be best to have a feeding gastrostomy tube placed (she had one previously after her revision surgery) and provide nourishment in that fashion.  If she responds, then it would support this as the nature of the problem.  I would like her bariatric surgeon, Dr. Kieth Brightly, to review her  history, and ancillary data, including my endoscopy report and x-rays.  We will discuss her case together thereafter and formulate a plan.  I discussed this with the patient very briefly.  Thanks  Docia Chuck. Geri Seminole., M.D. [facility] Division of Gastroenterology

## 2019-05-03 NOTE — Progress Notes (Signed)
Patient ID: Angie [name], female   DOB: October 11, 1965, 54 y.o.   MRN: [id number] Request received for possible G tube placement in pt. Latest imaging studies were reviewed by Dr. Anselm Pancoast and anatomy is not amenable to percutaneous gastrostomy tube placement. Will need surgical evaluation for placement.

## 2019-05-03 NOTE — Progress Notes (Signed)
Hypoglycemic Event  CBG: 63   Treatment: D50 25 mL (12.5 gm)  Symptoms: None  Follow-up CBG: [id number] CBG Result:252  Possible Reasons for Event: Inadequate meal intake  Comments/MD notified:Danny NP    Tad Moore

## 2019-05-03 NOTE — Progress Notes (Signed)
PROGRESS NOTE    Angie [name]  [id number] DOB: 05-18-1965 DOA: 04/29/2019 PCP: Harvie Junior, MD   Brief Narrative: 54 year old female with history of gastric bypass surgery prior to which she had a history of diabetes mellitus CKD stage III, chronic anemia, chronic malnutrition with history of esophageal strictures, bipolar disorder and chronic pain, chronic anemia was found unresponsive by patient's boyfriend when he returned back from work after 12-hour shift.  Patient is lying in recliner and was found to be in the 70s.  EMS was called and found her sugar was 23 was given glucagon and D50 and brought to the ER.  To admitting physician patient stated "that she has been feeling increasingly weak over the last 3 weeks with increasing peripheral edema of both lower extremity and difficulty ambulate with patient planning to see a neurologist because of the difficulty ambulating at no incontinence of urine or bowel or any decrease sensation of the extremities.  Patient did have a fall about a week ago when she tripped on a vanity in which fell on her." On phone per her fiancee since march of last year she has been losing weight, he thinks her esophagus is closing up again. Had appointment with her surgeon this week. Has good appetite but not abel to swallow. She is very fatigued easily with minimal activity. Only eating soft food and soup. Only little bit of regular food. She started to have leg swelling recently.   In [id number] more alert awake but was hypothermic with temperature of 93 F.  Patient became again hypoglycemic and had to be started on D10.  Blood cultures procalcitonin levels cortisol level TSH was sent.  Patient had CT head and C-spine followed by CT chest and abdomen.  CT head and C-spine were unremarkable.  CT chest and abdomen was showing third spacing of fluid and hepatic steatosis.  Pulmonary nodule also seen.  Covid test negative.  Labs show creatinine 1.5 albumin of 2.4  hemoglobin 7.4 which further decreased to 6.4 WBC was 2.  Patient was started on D10 and admitted for further management of hypothermia hypoglycemia with elevated procalcitonin concerning for infection for which antibiotics were started Patient was admitted-with empiric antibiotics IV fluids and dextrose infusion. Mental status has been lethargic.Patient on chronic Xanax, high-dose oxycodone and Seroquel and dose was cut down with improvement in her mental status. Continues to have poor oral intake, 1/5-GI consulted 1/6-status post EGD  Subjective:  Patient is alert awake oriented.This morning blood sugar at 67, remains on D10 ivf Asking to increase her oral pain medication Temp minimum 97.3.  Some loose stool this am-but afebrile no elevated WBC count.  Assessment & Plan:   Refractory hypoglycemia:suspecting due to poor nutritional status/severe protein calorie malnutrition with poor oral intake. Random cortisol on admission was 25.7 essentially ruling out adrenalinsufficiency (although she also underwent cosyntropin test).  C-peptide level normal, random insulin level low, proinsulin/insulin ratio,  insulin growth factor pending.  Is still intermittently hypoglycemic continue D10 infusion.  Continue diet as tolerated.  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  Tiny gastric remnant suspect gastric remnant too small to allow for proper nutritional absorption and is causing her difficulties with metabolic abnormalities, weight loss, feeding problems. 1/7-upper GI series confirms very small gastric remnant  which immediately empties into the small intestine. Additionally esophageal dysmotility present with slow esophageal clearance, no stricture. Appreciate GI input.  Consulting  bariatric surgery Dr. Kieth Brightly to look into possibility of reversing the Roux-en-Y She had GJ revision in 02/2018.   Hyponatremia:Likely from poor oral intake.Resolved. Hypomagnesemia: Improved after replacement monitor daily Hypokalemia: Being repleted. Metabolic acidosis with bicarb 10-->13-15  Hypothermia: Likely due to her severe malnutrition/hypoglycemia.Rule out sepsis.  Pro Cal was elevated on admission 7.5.  -repeat-procalcitonin 0.99,Lactic acid normal. Oon empiric vancomycin and cefepime on admission. COVID-19 negative blood culture 1/3  Neg so far. UA on admissions negative and CT chest and abdomen no significant finding.  MRCP-no evidence of cholecystitis no CBD dilatation gallbladder with sludge and distended.  Vancomycin discontinued 1/6   Severe hepatic steatosis:Noted in MRCP.  CBD 7 mm without intrahepatic biliary dilation. unclear etiology question due to malnutrition.    Distended gallbladder with sludge- monitor lfts. No RUQ tenderness  Moderate bilateral hydronephrosis and hydroureter cont as needed  with bladder scan.Check renal ultrasound.  Macrocytic anemia/folate deficiency:Vitamin B12 high, ferritin 247, iron 59, folate low at less than 4.7-started on iv folate/thiamine supplementation, will dose with B12 subcu x1. Fecal occult blood was negative.  Patient received 1 unit PRBC for hemoglobin of 6.4 g. Monitor. Recent Labs  Lab 04/29/19 2359 04/30/19 0554 05/01/19 0949 05/02/19 0334 05/03/19 0455  HGB 6.4* 9.0* 10.0* 8.8* 8.0*  HCT 19.7* 28.3* 30.0* 26.1* 23.7*   Leukopenia WBC improved Recent Labs  Lab 04/29/19 2359 04/30/19 0554 05/01/19 0949 05/02/19 0334 05/03/19 0455  WBC 3.2* 5.9 1.8* 5.0 4.4   Bilateral lower extremity edema/anasarca/third spacing: Likely in the setting of poor nutrition and hypoalbuminemia  Severe Protein calorie malnutrition: Likely ongoing issues for some time.  Continue dietitian follow-up and augment nutrition with  supplementation.  Continue multivitamins as ordered. Monitor magnesium phosphorus and labs daily to watch for refeeding syndrome. Notified by RN about loose stool today. Imodium prn.  Patient has no significant abdominal tenderness, no leukocytosis or fever.  Generalized weakness/incontinence: Nonfocal on exam, extensive work-up with MRI T, L-spine CT head CT C-spine unremarkable.  Likely in the setting of hyperglycemia/failure to thrive  CKD stage III: Creatinine appears more or less stable stable, downtrending. monitor Recent Labs  Lab 04/29/19 2359 04/30/19 0554 05/01/19 0804 05/02/19 0334 05/03/19 0455  BUN 12 12 12 13 16   CREATININE 1.39* 1.54* 1.28* 1.22* 1.12*   S/P gastric bypass history.  Bipolar disorder/anxiety Seroquel and Xanax at home.  Given her lethargy cut down Xanax  To 0.25 mg from 1 mg and Seroquel to 50 from 100 mg.  History of chronic pain on oxycodone-patient asking to have more medication, continue at 7.5 oxycodone cautiously advised for sedation.  Discussed about need to cut back on her pain regimen given her weight loss and her presentation. Body mass index is 24.58 kg/m.   DVT prophylaxis:lovenox Code Status: full code Family Communication: plan of care discussed with patient at bedside.  I have discussed the case with patient's fianc over the phone.  Disposition Plan: Remains inpatient due to persistent hypoglycemia and further work-up for her dysphagia severe protein calorie malnutrition and failure to thrive   Consultants:GI. surgery Procedures:  1/7-upper GI series confirms very small gastric remnant which immediately empties into the small intestine. Additionally esophageal dysmotility present with slow esophageal clearance, no stricture.  EGD 1. Status post Roux-en-Y gastric bypass surgery. No stricture or stenosis 2. Tiny gastric remnant. I wonder if gastric volume  is so small as to explain her issues with eating difficulties, weight loss, and  metabolic abnormalities. Impression: 1. Soft diet 2. Upper GI series to evaluate gastric remnant size 3. Ask bariatric surgeons to weigh in on her current anatomy to see if she would benefit from reversal of her surgery. This could likely be done endoscopically. 4. We will follow  MRI ABD/MRCP 1. The gallbladder is distended and sludge filled. No discrete calculi are appreciated. The common bile duct measures up to 7 mm in caliber distally to the ampulla, and there no significant intrahepatic biliary ductal dilatation. There is no calculus or other obstructing lesion appreciated within the biliary tract. 2. There is no gallbladder wall thickening or pericholecystic fluid to suggest acute cholecystitis. 3. Severe hepatic steatosis. 4. Moderate bilateral hydronephrosis and hydroureter of the included portions of the ureters with distended urinary bladder seen in the included lower abdomen. Correlate for urinary retention. 5. Pleural effusions, trace ascites, and anasarca.  Mild thoracic spine without contrast: Normal appearing thoracic spine and spinal cord. Negative for stenosis. Small bilateral pleural effusions  MRI lumbar spine without contrast:  Mild lower lumbar spondylosis without central canal or foraminal narrowing. No finding to explain the patient's symptoms  CT chest abdomen pelvis with contrast:Trace left-sided pleural effusion.  No acute airspace disease. 2. Punctate nodules at the apices. No follow-up needed if patient is low-risk (and has no known or suspected primary neoplasm). Non-contrast chest CT can be considered in 12 months if patient is high-risk  TTE:. Left ventricular ejection fraction, by visual estimation, is 50 to 55%. The left ventricle has normal function. There is no left ventricular hypertrophy.  2. The left ventricle has no regional wall motion abnormalities.  3. Global right ventricle has normal systolic function.The right ventricular size is normal. No  increase in right ventricular wall thickness.  4. Left atrial size was normal.  5. Right atrial size was normal.  6. The mitral valve is normal in structure. No evidence of mitral valve regurgitation.  7. The tricuspid valve is normal in structure.  8. The aortic valve is normal in structure. Aortic valve regurgitation is not visualized.  9. The pulmonic valve was normal in structure. Pulmonic valve regurgitation is not visualized. 10. Normal pulmonary artery systolic pressure. 11. The atrial septum is grossly normal  RUQ [place]: Gallbladder distended with sludge present. No gallstones seen; small gallstones could be obscured by sludge. No gallbladder wall thickening or pericholecystic fluid. 2. Biliary duct dilatation. No mass or calculus is appreciable on this study in the biliary ductal system. From an imaging standpoint, MRCP would be the optimum imaging study of choice to further evaluate the biliary ductal system. 3. Complex cystic lesion in the left lobe of the liver measuring 1.6 x 1.4 x 1.0 cm. Underlying hepatic steatosis. 4.  Slight ascites. 5. Prominence of renal pyramids, a finding of uncertain significance  Microbiology:  Antimicrobials: Anti-infectives (From admission, onward)   Start     Dose/Rate Route Frequency Ordered Stop   05/01/19 0300  vancomycin (VANCOREADY) IVPB 500 mg/100 mL  Status:  Discontinued     500 mg 100 mL/hr over 60 Minutes Intravenous Every 24 hours 04/30/19 0833 05/02/19 1348   04/30/19 2200  vancomycin (VANCOREADY) IVPB 500 mg/100 mL  Status:  Discontinued     500 mg 100 mL/hr over 60 Minutes Intravenous Every 24 hours 04/30/19 0103 04/30/19 0833   04/30/19 1000  ceFEPIme (MAXIPIME) 2 g in sodium chloride 0.9 % 100 mL  IVPB     2 g 200 mL/hr over 30 Minutes Intravenous Every 12 hours 04/30/19 0103     04/29/19 2300  vancomycin (VANCOCIN) IVPB 1000 mg/200 mL premix     1,000 mg 200 mL/hr over 60 Minutes Intravenous  Once 04/29/19 2247 04/30/19 0447   04/29/19  2300  ceFEPIme (MAXIPIME) 2 g in sodium chloride 0.9 % 100 mL IVPB     2 g 200 mL/hr over 30 Minutes Intravenous  Once 04/29/19 2247 04/30/19 0020       Objective: Vitals:   05/03/19 0404 05/03/19 0406 05/03/19 0509 05/03/19 0818  BP: (!) 132/97   119/88  Pulse:    86  Resp: 15   17  Temp:   (!) 97.4 F (36.3 C) (!) 97.5 F (36.4 C)  TempSrc:   Oral Oral  SpO2:   100% 100%  Weight:  57.1 kg    Height:        Intake/Output Summary (Last 24 hours) at 05/03/2019 1236 Last data filed at 05/03/2019 1120 Gross per 24 hour  Intake 1241.1 ml  Output 200 ml  Net 1041.1 ml   Filed Weights   05/02/19 0500 05/02/19 1031 05/03/19 0406  Weight: 54.7 kg 54.7 kg 57.1 kg   Weight change: 0 kg  Body mass index is 24.58 kg/m.  Intake/Output from previous day: 01/06 0701 - 01/07 0700 In: 1916.2 [I.V.:1816.2; IV Piggyback:100] Out: 350 [Urine:350] Intake/Output this shift: Total I/O In: 325 [I.V.:325] Out: -   Examination:  General exam: ILL frail looking older than his stated age, alert awake oriented.  HEENT:Oral mucosa moist, Ear/Nose WNL grossly, dentition normal. Respiratory system: Bilaterally clear breath sounds, no wheezing or crackles  Cardiovascular system: S1 & S2 +,No JVD. Gastrointestinal system: Abdomen soft, mildly distended with ascites NT,,BS+ Nervous System:Alert, awake, moving extremities and grossly nonfocal Extremities: Bilateral lower extremity edema +,distal peripheral pulses palpable.  Skin: No rashes,no icterus. MSK: thin/wasted muscle bulk,tone, power  Medications:  Scheduled Meds: . feeding supplement  1 Container Oral TID BM  . feeding supplement (PRO-STAT SUGAR FREE 64)  30 mL Oral BID  . folic acid  1 mg Intravenous Daily  . [START ON 05/04/2019] influenza vac split quadrivalent PF  0.5 mL Intramuscular Tomorrow-1000  . OLANZapine  2.5 mg Oral Daily  . oxyCODONE  7.5 mg Oral Q6H  . pantoprazole  40 mg Oral QHS  . QUEtiapine  50 mg Oral QHS  .  thiamine injection  100 mg Intravenous Daily   Continuous Infusions: . ceFEPime (MAXIPIME) IV 2 g (05/03/19 1157)  . dextrose 75 mL/hr at 05/03/19 0531  . lactated ringers Stopped (05/02/19 1216)    Data Reviewed: I have personally reviewed following labs and imaging studies  CBC: Recent Labs  Lab 04/29/19 1546 04/29/19 2359 04/30/19 0554 05/01/19 0949 05/02/19 0334 05/03/19 0455  WBC 2.0*  2.0* 3.2* 5.9 1.8* 5.0 4.4  NEUTROABS 1.3* 2.4 4.1 1.5*  --   --   HGB 7.4*  7.4* 6.4* 9.0* 10.0* 8.8* 8.0*  HCT 24.7*  24.6* 19.7* 28.3* 30.0* 26.1* 23.7*  MCV 116.0*  116.0* 107.1* 105.2* 99.7 99.6 100.0  PLT 186  175 210 179 167 165 [id number]*   Basic Metabolic Panel: Recent Labs  Lab 04/29/19 1650 04/29/19 2359 04/30/19 0554 05/01/19 0804 05/02/19 0334 05/03/19 0455  NA  --  135 138 137 140 140  K  --  3.6 3.8 4.2 4.1 3.3*  CL  --  113* 112* 111 117*  117*  CO2  --  15* 13* 16* 14* 15*  GLUCOSE  --  191* 122* 237* 89 283*  BUN  --  12 12 12 13 16   CREATININE  --  1.39* 1.54* 1.28* 1.22* 1.12*  CALCIUM  --  7.6* 7.7* 7.8* 8.2* 7.9*  MG 1.8  --   --  1.5* 2.1 2.0  PHOS 4.3  --   --   --  4.0 3.0   GFR: Estimated Creatinine Clearance: 46.5 mL/min (A) (by C-G formula based on SCr of 1.12 mg/dL (H)). Liver Function Tests: Recent Labs  Lab 04/29/19 2359 04/30/19 0554 05/01/19 0804 05/02/19 0334 05/03/19 0455  AST 36 39 31 29 23   ALT 26 26 29 28 25   ALKPHOS 173* 162* 165* 138* 109  BILITOT 0.7 1.2 0.8 0.9 1.0  PROT 4.7* 4.8* 5.0* 4.9* 4.4*  ALBUMIN 2.0* 2.0* 2.1* 1.9* 1.7*   Recent Labs  Lab 04/29/19 1650  LIPASE 13   Recent Labs  Lab 04/29/19 1650  AMMONIA 38*   Coagulation Profile: Recent Labs  Lab 05/01/19 0949  INR 1.4*   Cardiac Enzymes: Recent Labs  Lab 04/29/19 2359  CKTOTAL 120   BNP (last 3 results) No results for input(s): PROBNP in the last 8760 hours. HbA1C: No results for input(s): HGBA1C in the last 72 hours. CBG: Recent Labs  Lab  05/03/19 0637 05/03/19 0748 05/03/19 0914 05/03/19 1046 05/03/19 1200  GLUCAP 86 67* 81 128* 87   Lipid Profile: No results for input(s): CHOL, HDL, LDLCALC, TRIG, CHOLHDL, LDLDIRECT in the last 72 hours. Thyroid Function Tests: No results for input(s): TSH, T4TOTAL, FREET4, T3FREE, THYROIDAB in the last 72 hours. Anemia Panel: No results for input(s): VITAMINB12, FOLATE, FERRITIN, TIBC, IRON, RETICCTPCT in the last 72 hours. Sepsis Labs: Recent Labs  Lab 04/29/19 1650 05/02/19 0334 05/03/19 0455  PROCALCITON 7.56 1.87 0.99  LATICACIDVEN 1.0  --   --     Recent Results (from the past 240 hour(s))  Blood culture (routine x 2)     Status: None (Preliminary result)   Collection Time: 04/29/19  4:20 PM   Specimen: Right Antecubital; Blood  Result Value Ref Range Status   Specimen Description RIGHT ANTECUBITAL  Final   Special Requests   Final    BOTTLES DRAWN AEROBIC AND ANAEROBIC Blood Culture adequate volume   Culture   Final    NO GROWTH 3 DAYS Performed at [facility], [place] [address] [place], [place] [address]    Report Status PENDING  Incomplete  Culture, Urine     Status: None   Collection Time: 04/29/19  5:15 PM   Specimen: Urine, Random  Result Value Ref Range Status   Specimen Description URINE, RANDOM  Final   Special Requests NONE  Final   Culture   Final    NO GROWTH Performed at [facility], [place] [address], [place], [place] [address]    Report Status 05/01/2019 FINAL  Final  SARS CORONAVIRUS 2 (TAT 6-24 HRS) Nasopharyngeal Nasopharyngeal Swab     Status: None   Collection Time: 04/29/19  8:23 PM   Specimen: Nasopharyngeal Swab  Result Value Ref Range Status   SARS Coronavirus 2 NEGATIVE NEGATIVE Final    Comment: (NOTE) SARS-CoV-2 target nucleic acids are NOT DETECTED. The SARS-CoV-2 RNA is generally detectable in upper and lower respiratory specimens during the acute phase of infection. Negative results do not preclude SARS-CoV-2  infection, do not rule out co-infections with  other pathogens, and should not be used as the sole basis for treatment or other patient management decisions. Negative results must be combined with clinical observations, patient history, and epidemiological information. The expected result is Negative. Fact Sheet for Patients: SugarRoll.be Fact Sheet for Healthcare Providers: https://www.woods-mathews.com/ This test is not yet approved or cleared by the [place] FDA and  has been authorized for detection and/or diagnosis of SARS-CoV-2 by FDA under an Emergency Use Authorization (EUA). This EUA will remain  in effect (meaning this test can be used) for the duration of the COVID-19 declaration under Section 56 4(b)(1) of the Act, 21 U.S.C. section 360bbb-3(b)(1), unless the authorization is terminated or revoked sooner. Performed at [facility], [place] [address], [place], [place] [address]   Blood culture (routine x 2)     Status: None (Preliminary result)   Collection Time: 04/29/19 11:09 PM   Specimen: BLOOD  Result Value Ref Range Status   Specimen Description BLOOD LEFT ANTECUBITAL  Final   Special Requests   Final    BOTTLES DRAWN AEROBIC AND ANAEROBIC Blood Culture adequate volume   Culture   Final    NO GROWTH 2 DAYS Performed at [facility], [place] [address] [place], [place] [address]    Report Status PENDING  Incomplete  MRSA PCR Screening     Status: None   Collection Time: 04/30/19  5:31 PM   Specimen: Nasopharyngeal  Result Value Ref Range Status   MRSA by PCR NEGATIVE NEGATIVE Final    Comment:        The GeneXpert MRSA Assay (FDA approved for NASAL specimens only), is one component of a comprehensive MRSA colonization surveillance program. It is not intended to diagnose MRSA infection nor to guide or monitor treatment for MRSA infections. Performed at [facility], [place] [address] [place],   [address]       Radiology Studies: DG UGI W SINGLE CM (SOL OR THIN BA)  Result Date: 05/03/2019 CLINICAL DATA:  Remote Roux-en-Y gastric bypass. Nausea and vomiting. Evaluate pouch and emptying. EXAM: UPPER GI SERIES WITH KUB TECHNIQUE: After obtaining a scout radiograph a routine upper GI series was performed using thin barium FLUOROSCOPY TIME:  Fluoroscopy Time:  2 minutes 24 seconds Radiation Exposure Index (if provided by the fluoroscopic device): Number of Acquired Spot Images: 0 COMPARISON:  CT 04/30/2019 FINDINGS: Fluoroscopic evaluation of swallowing demonstrates poor esophageal peristalsis with full column stasis and slow emptying of the esophagus. No fixed stricture. There is a very small gastric pouch with immediate emptying of the pouch in to small bowel. Small bowel is normal caliber. IMPRESSION: Very small pouch with immediate emptying into small bowel. Slow clearance of the esophagus with esophageal dysmotility. No fixed stricture. Electronically Signed   By: Rolm Baptise M.D.   On: 05/03/2019 09:22      LOS: 3 days   Time spent: More than 50% of that time was spent in counseling and/or coordination of care.  Antonieta Pert, MD [facility]  05/03/2019, 12:36 PM

## 2019-05-03 NOTE — Progress Notes (Signed)
D10% increased to 75 mL/hr by [facility] NP.

## 2019-05-03 NOTE — Care Management Important Message (Signed)
Important Message  Patient Details  Name: Angie [name] MRN: [id number] Date of Birth: 07-31-1965   Medicare Important Message Given:  Yes     Orbie Pyo 05/03/2019, 3:35 PM

## 2019-05-03 NOTE — Progress Notes (Signed)
The patient is having frequent loose stools. Notified Danny NP and received imodium 4 mg x one dose. Order already implemented. Will continue to monitor.

## 2019-05-04 ENCOUNTER — Encounter (HOSPITAL_COMMUNITY): Admission: EM | Disposition: A | Discharge status: Home Health | Payer: Self-pay | Source: Home / Self Care

## 2019-05-04 ENCOUNTER — Inpatient Hospital Stay (HOSPITAL_COMMUNITY): Payer: Medicare Other | Admitting: Certified Registered"

## 2019-05-04 ENCOUNTER — Other Ambulatory Visit: Payer: Self-pay | Admitting: General Surgery

## 2019-05-04 HISTORY — PX: LAPAROSCOPIC INSERTION GASTROSTOMY TUBE: SHX6817

## 2019-05-04 LAB — COMPREHENSIVE METABOLIC PANEL
ALT: 23 U/L (ref 0–44)
AST: 20 U/L (ref 15–41)
Albumin: 1.6 g/dL — ABNORMAL LOW (ref 3.5–5.0)
Alkaline Phosphatase: 113 U/L (ref 38–126)
Anion gap: 7 (ref 5–15)
BUN: 18 mg/dL (ref 6–20)
CO2: 14 mmol/L — ABNORMAL LOW (ref 22–32)
Calcium: 7.7 mg/dL — ABNORMAL LOW (ref 8.9–10.3)
Chloride: 116 mmol/L — ABNORMAL HIGH (ref 98–111)
Creatinine, Ser: 1.18 mg/dL — ABNORMAL HIGH (ref 0.44–1.00)
GFR calc Af Amer: >60 mL/min (ref >60)
GFR calc non Af Amer: 53 mL/min — ABNORMAL LOW (ref >60)
Glucose, Bld: 110 mg/dL — ABNORMAL HIGH (ref 70–99)
Potassium: 3.3 mmol/L — ABNORMAL LOW (ref 3.5–5.1)
Sodium: 137 mmol/L (ref 135–145)
Total Bilirubin: 0.7 mg/dL (ref 0.3–1.2)
Total Protein: 4.2 g/dL — ABNORMAL LOW (ref 6.5–8.1)

## 2019-05-04 LAB — GLUCOSE, CAPILLARY
Glucose-Capillary: 112 mg/dL — ABNORMAL HIGH (ref 70–99)
Glucose-Capillary: 118 mg/dL — ABNORMAL HIGH (ref 70–99)
Glucose-Capillary: 122 mg/dL — ABNORMAL HIGH (ref 70–99)
Glucose-Capillary: 128 mg/dL — ABNORMAL HIGH (ref 70–99)
Glucose-Capillary: 146 mg/dL — ABNORMAL HIGH (ref 70–99)
Glucose-Capillary: 151 mg/dL — ABNORMAL HIGH (ref 70–99)
Glucose-Capillary: 176 mg/dL — ABNORMAL HIGH (ref 70–99)
Glucose-Capillary: 211 mg/dL — ABNORMAL HIGH (ref 70–99)
Glucose-Capillary: 240 mg/dL — ABNORMAL HIGH (ref 70–99)
Glucose-Capillary: 240 mg/dL — ABNORMAL HIGH (ref 70–99)
Glucose-Capillary: 61 mg/dL — ABNORMAL LOW (ref 70–99)
Glucose-Capillary: 63 mg/dL — ABNORMAL LOW (ref 70–99)
Glucose-Capillary: 71 mg/dL (ref 70–99)
Glucose-Capillary: 85 mg/dL (ref 70–99)
Glucose-Capillary: 86 mg/dL (ref 70–99)
Glucose-Capillary: 87 mg/dL (ref 70–99)
Glucose-Capillary: 91 mg/dL (ref 70–99)
Glucose-Capillary: 95 mg/dL (ref 70–99)
Glucose-Capillary: 98 mg/dL (ref 70–99)

## 2019-05-04 LAB — CULTURE, BLOOD (ROUTINE X 2)
Culture: NO GROWTH
Special Requests: ADEQUATE

## 2019-05-04 LAB — CBC
HCT: 23.8 % — ABNORMAL LOW (ref 36.0–46.0)
Hemoglobin: 7.9 g/dL — ABNORMAL LOW (ref 12.0–15.0)
MCH: 33.3 pg (ref 26.0–34.0)
MCHC: 33.2 g/dL (ref 30.0–36.0)
MCV: 100.4 fL — ABNORMAL HIGH (ref 80.0–100.0)
Platelets: 122 10*3/uL — ABNORMAL LOW (ref 150–400)
RBC: 2.37 MIL/uL — ABNORMAL LOW (ref 3.87–5.11)
RDW: 18.6 % — ABNORMAL HIGH (ref 11.5–15.5)
WBC: 6.7 10*3/uL (ref 4.0–10.5)
nRBC: 0 % (ref 0.0–0.2)

## 2019-05-04 LAB — VITAMIN D 25 HYDROXY (VIT D DEFICIENCY, FRACTURES): Vit D, 25-Hydroxy: 7.29 ng/mL — ABNORMAL LOW (ref 30–100)

## 2019-05-04 LAB — MAGNESIUM: Magnesium: 1.7 mg/dL (ref 1.7–2.4)

## 2019-05-04 LAB — PHOSPHORUS: Phosphorus: 2.3 mg/dL — ABNORMAL LOW (ref 2.5–4.6)

## 2019-05-04 SURGERY — INSERTION, GASTROSTOMY TUBE, PERCUTANEOUS
Anesthesia: General | Site: Abdomen | Laterality: Left

## 2019-05-04 MED ORDER — PROPOFOL 10 MG/ML IV BOLUS
INTRAVENOUS | Status: AC
  Filled 2019-05-04: qty 40

## 2019-05-04 MED ORDER — HEMOSTATIC AGENTS (NO CHARGE) OPTIME
TOPICAL | Status: DC | PRN
  Administered 2019-05-04: 1 via TOPICAL

## 2019-05-04 MED ORDER — 0.9 % SODIUM CHLORIDE (POUR BTL) OPTIME
TOPICAL | Status: DC | PRN
  Administered 2019-05-04: 13:00:00 1000 mL

## 2019-05-04 MED ORDER — SUCCINYLCHOLINE CHLORIDE 200 MG/10ML IV SOSY
PREFILLED_SYRINGE | INTRAVENOUS | Status: DC | PRN
  Administered 2019-05-04: 60 mg via INTRAVENOUS

## 2019-05-04 MED ORDER — SODIUM CHLORIDE 0.9 % IR SOLN
Status: DC | PRN
  Administered 2019-05-04: 1000 mL

## 2019-05-04 MED ORDER — LIDOCAINE 2% (20 MG/ML) 5 ML SYRINGE
INTRAMUSCULAR | Status: DC | PRN
  Administered 2019-05-04: 60 mg via INTRAVENOUS

## 2019-05-04 MED ORDER — LACTATED RINGERS IV SOLN: INTRAVENOUS | Status: DC

## 2019-05-04 MED ORDER — SUGAMMADEX SODIUM 200 MG/2ML IV SOLN
INTRAVENOUS | Status: DC | PRN
  Administered 2019-05-04: 200 mg via INTRAVENOUS

## 2019-05-04 MED ORDER — BUPIVACAINE-EPINEPHRINE (PF) 0.25% -1:200000 IJ SOLN
INTRAMUSCULAR | Status: AC
  Filled 2019-05-04: qty 20

## 2019-05-04 MED ORDER — HYDROMORPHONE HCL 1 MG/ML IJ SOLN
0.2500 mg | INTRAMUSCULAR | Status: DC | PRN
  Administered 2019-05-04 (×2): 0.5 mg via INTRAVENOUS

## 2019-05-04 MED ORDER — BUPIVACAINE-EPINEPHRINE 0.25% -1:200000 IJ SOLN
INTRAMUSCULAR | Status: DC | PRN
  Administered 2019-05-04: 20 mL

## 2019-05-04 MED ORDER — CEFAZOLIN SODIUM-DEXTROSE 2-4 GM/100ML-% IV SOLN
2.0000 g | INTRAVENOUS | Status: AC
  Filled 2019-05-04: qty 100

## 2019-05-04 MED ORDER — ROCURONIUM BROMIDE 50 MG/5ML IV SOSY
PREFILLED_SYRINGE | INTRAVENOUS | Status: DC | PRN
  Administered 2019-05-04: 40 mg via INTRAVENOUS

## 2019-05-04 MED ORDER — OSMOLITE 1.5 CAL PO LIQD
80.0000 mL | Freq: Every day | ORAL | Status: DC
  Administered 2019-05-04 – 2019-05-05 (×2): 237 mL
  Filled 2019-05-04 (×6): qty 237

## 2019-05-04 MED ORDER — DEXTROSE 50 % IV SOLN
INTRAVENOUS | Status: AC
  Filled 2019-05-04: qty 50

## 2019-05-04 MED ORDER — DEXAMETHASONE SODIUM PHOSPHATE 10 MG/ML IJ SOLN
INTRAMUSCULAR | Status: DC | PRN
  Administered 2019-05-04: 10 mg via INTRAVENOUS

## 2019-05-04 MED ORDER — ROCURONIUM BROMIDE 10 MG/ML (PF) SYRINGE
PREFILLED_SYRINGE | INTRAVENOUS | Status: AC
  Filled 2019-05-04: qty 10

## 2019-05-04 MED ORDER — KETOROLAC TROMETHAMINE 30 MG/ML IJ SOLN
INTRAMUSCULAR | Status: AC
  Filled 2019-05-04: qty 1

## 2019-05-04 MED ORDER — OXYCODONE HCL 5 MG/5ML PO SOLN: 5.0000 mg | Freq: Once | ORAL | Status: DC | PRN

## 2019-05-04 MED ORDER — LACTATED RINGERS IV SOLN: INTRAVENOUS | Status: DC | PRN

## 2019-05-04 MED ORDER — OXYCODONE HCL 5 MG PO TABS: 5.0000 mg | ORAL_TABLET | Freq: Once | ORAL | Status: DC | PRN

## 2019-05-04 MED ORDER — ONDANSETRON HCL 4 MG/2ML IJ SOLN
INTRAMUSCULAR | Status: DC | PRN
  Administered 2019-05-04: 4 mg via INTRAVENOUS

## 2019-05-04 MED ORDER — ONDANSETRON HCL 4 MG/2ML IJ SOLN: 4.0000 mg | Freq: Once | INTRAMUSCULAR | Status: DC | PRN

## 2019-05-04 MED ORDER — KETOROLAC TROMETHAMINE 30 MG/ML IJ SOLN
30.0000 mg | Freq: Once | INTRAMUSCULAR | Status: AC | PRN
  Administered 2019-05-04: 30 mg via INTRAVENOUS

## 2019-05-04 MED ORDER — FENTANYL CITRATE (PF) 250 MCG/5ML IJ SOLN
INTRAMUSCULAR | Status: AC
  Filled 2019-05-04: qty 5

## 2019-05-04 MED ORDER — OXYCODONE HCL 5 MG/5ML PO SOLN: 5.0000 mg | Freq: Four times a day (QID) | ORAL | Status: DC | PRN

## 2019-05-04 MED ORDER — HYDROMORPHONE HCL 1 MG/ML IJ SOLN
INTRAMUSCULAR | Status: AC
  Filled 2019-05-04: qty 1

## 2019-05-04 MED ORDER — ALBUMIN HUMAN 5 % IV SOLN: INTRAVENOUS | Status: DC | PRN

## 2019-05-04 MED ORDER — LIDOCAINE 2% (20 MG/ML) 5 ML SYRINGE
INTRAMUSCULAR | Status: AC
  Filled 2019-05-04: qty 5

## 2019-05-04 MED ORDER — INSULIN ASPART 100 UNIT/ML ~~LOC~~ SOLN
4.0000 [IU] | Freq: Once | SUBCUTANEOUS | Status: AC
  Administered 2019-05-04: 4 [IU] via SUBCUTANEOUS

## 2019-05-04 MED ORDER — FREE WATER
40.0000 mL | Freq: Every day | Status: DC
  Administered 2019-05-04 – 2019-05-08 (×19): 40 mL
  Administered 2019-05-08: 30 mL
  Administered 2019-05-09 (×2): 20 mL
  Administered 2019-05-09 – 2019-05-11 (×8): 40 mL

## 2019-05-04 MED ORDER — PROPOFOL 10 MG/ML IV BOLUS
INTRAVENOUS | Status: DC | PRN
  Administered 2019-05-04: 80 mg via INTRAVENOUS

## 2019-05-04 MED ORDER — BUPIVACAINE-EPINEPHRINE (PF) 0.25% -1:200000 IJ SOLN
INTRAMUSCULAR | Status: AC
  Filled 2019-05-04: qty 10

## 2019-05-04 MED ORDER — MIDAZOLAM HCL 2 MG/2ML IJ SOLN
INTRAMUSCULAR | Status: AC
  Filled 2019-05-04: qty 2

## 2019-05-04 MED ORDER — JEVITY 1.2 CAL PO LIQD
60.0000 mL | ORAL | Status: DC
  Administered 2019-05-05 – 2019-05-07 (×13): 60 mL
  Filled 2019-05-04 (×19): qty 237

## 2019-05-04 MED ORDER — PHENYLEPHRINE HCL-NACL 10-0.9 MG/250ML-% IV SOLN
INTRAVENOUS | Status: DC | PRN
  Administered 2019-05-04: 30 ug/min via INTRAVENOUS

## 2019-05-04 MED ORDER — FENTANYL CITRATE (PF) 250 MCG/5ML IJ SOLN
INTRAMUSCULAR | Status: DC | PRN
  Administered 2019-05-04: 50 ug via INTRAVENOUS

## 2019-05-04 MED ORDER — PHENYLEPHRINE 40 MCG/ML (10ML) SYRINGE FOR IV PUSH (FOR BLOOD PRESSURE SUPPORT)
PREFILLED_SYRINGE | INTRAVENOUS | Status: DC | PRN
  Administered 2019-05-04 (×2): 120 ug via INTRAVENOUS

## 2019-05-04 SURGICAL SUPPLY — 40 items
ADH SKN CLS APL DERMABOND .7 (GAUZE/BANDAGES/DRESSINGS) ×1
APL PRP STRL LF DISP 70% ISPRP (MISCELLANEOUS) ×1
BAG URINE DRAINAGE (UROLOGICAL SUPPLIES) IMPLANT
BLADE CLIPPER SURG (BLADE) IMPLANT
CANISTER SUCT 3000ML PPV (MISCELLANEOUS) IMPLANT
CHLORAPREP W/TINT 26 (MISCELLANEOUS) ×3 IMPLANT
COVER SURGICAL LIGHT HANDLE (MISCELLANEOUS) ×3 IMPLANT
COVER WAND RF STERILE (DRAPES) ×3 IMPLANT
DECANTER SPIKE VIAL GLASS SM (MISCELLANEOUS) ×3 IMPLANT
DERMABOND ADVANCED (GAUZE/BANDAGES/DRESSINGS) ×2
DERMABOND ADVANCED .7 DNX12 (GAUZE/BANDAGES/DRESSINGS) ×1 IMPLANT
ELECT REM PT RETURN 9FT ADLT (ELECTROSURGICAL) ×3 IMPLANT
ELECTRODE REM PT RTRN 9FT ADLT (ELECTROSURGICAL) ×1 IMPLANT
GLOVE BIOGEL PI IND STRL 7.0 (GLOVE) ×1 IMPLANT
GLOVE BIOGEL PI INDICATOR 7.0 (GLOVE) ×2
GLOVE SURG SS PI 7.0 STRL IVOR (GLOVE) ×3 IMPLANT
GOWN STRL REUS W/ TWL LRG LVL3 (GOWN DISPOSABLE) ×3 IMPLANT
GOWN STRL REUS W/TWL LRG LVL3 (GOWN DISPOSABLE) ×9
HEMOSTAT SNOW SURGICEL 2X4 (HEMOSTASIS) ×2 IMPLANT
KIT BASIN OR (CUSTOM PROCEDURE TRAY) ×3 IMPLANT
KIT INTRO ENDO 22F F/GAST TUBE (SET/KITS/TRAYS/PACK) IMPLANT
KIT INTRODUCER MIC G-18 (SET/KITS/TRAYS/PACK) ×3 IMPLANT
KIT TURNOVER KIT B (KITS) ×3 IMPLANT
NEEDLE 22X1 1/2 (OR ONLY) (NEEDLE) ×3 IMPLANT
NS IRRIG 1000ML POUR BTL (IV SOLUTION) ×3 IMPLANT
PAD ARMBOARD 7.5X6 YLW CONV (MISCELLANEOUS) ×6 IMPLANT
PLUG CATH AND CAP STER (CATHETERS) IMPLANT
SCISSORS LAP 5X35 DISP (ENDOMECHANICALS) IMPLANT
SET IRRIG TUBING LAPAROSCOPIC (IRRIGATION / IRRIGATOR) ×2 IMPLANT
SET TUBE SMOKE EVAC HIGH FLOW (TUBING) ×3 IMPLANT
SLEEVE ENDOPATH XCEL 5M (ENDOMECHANICALS) ×3 IMPLANT
SUT ETHILON 2 0 FS 18 (SUTURE) IMPLANT
SUT MNCRL AB 4-0 PS2 18 (SUTURE) ×3 IMPLANT
TOWEL GREEN STERILE (TOWEL DISPOSABLE) ×3 IMPLANT
TOWEL GREEN STERILE FF (TOWEL DISPOSABLE) ×3 IMPLANT
TRAY LAPAROSCOPIC MC (CUSTOM PROCEDURE TRAY) ×3 IMPLANT
TROCAR XCEL BLUNT TIP 100MML (ENDOMECHANICALS) IMPLANT
TROCAR XCEL NON-BLD 5MMX100MML (ENDOMECHANICALS) ×3 IMPLANT
TUBE GASTROSTOMY 18F (CATHETERS) ×4 IMPLANT
WATER STERILE IRR 1000ML POUR (IV SOLUTION) ×3 IMPLANT

## 2019-05-04 NOTE — Transfer of Care (Signed)
Immediate Anesthesia Transfer of Care Note  Patient: Angie [name]  Procedure(s) Performed: LAPAROSCOPIC INSERTION GASTROSTOMY TUBE (Left Abdomen)  Patient Location: PACU  Anesthesia Type:General  Level of Consciousness: awake, alert  and oriented  Airway & Oxygen Therapy: Patient Spontanous Breathing  Post-op Assessment: Report given to RN and Post -op Vital signs reviewed and stable  Post vital signs: Reviewed and stable  Last Vitals:  Vitals Value Taken Time  BP 112/82 05/04/19 1347  Temp    Pulse 88 05/04/19 1350  Resp 18 05/04/19 1350  SpO2 100 % 05/04/19 1350  Vitals shown include unvalidated device data.  Last Pain:  Vitals:   05/04/19 1000  TempSrc:   PainSc: 0-No pain         Complications: No apparent anesthesia complications

## 2019-05-04 NOTE — Progress Notes (Addendum)
Nutrition Follow-up  DOCUMENTATION CODES:   Not applicable  INTERVENTION:   Goal tube feed regimen- Osmolite 1.5- 5 cans daily  Initiate Osmolite 1.5- 1/3 can (60ml) five times daily- Once tolerating, can advance to 1/2 can five times daily and then eventually to 1 can 5 times daily- Give via gravity   Flush with 83ml of water before and after each bolus feed  Regimen provides 1775kcal/day, 75g/day protein, 113ml/day free water   Pt is at high refeed risk; recommend continue to monitor K, Mg and P labs daily until stable.   Check thiamine, zinc, copper, vitamins D, E, A, K and C labs  NUTRITION DIAGNOSIS:   Inadequate oral intake related to lethargy/confusion, poor appetite as evidenced by per patient/family report, meal completion < 25%.  GOAL:   Patient will meet greater than or equal to 90% of their needs -progressing with G-tube placement  MONITOR:   PO intake, Supplement acceptance, Labs, Weight trends, I & O's  REASON FOR ASSESSMENT:   Consult Enteral/tube feeding initiation and management  ASSESSMENT:   54 y.o. 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 MD ok to start tube feeds; MD requesting bolus feeds. Will order to advance slowly as tolerated. Will order also for gravity feeds. If patient is unable to tolerate bolus feeds, she may need to be switched to continuous feeds. Pt is at high refeed risk. Pt has been eating <50% of meals in hospital but has been having trouble  keeping food down. Pt is now NPO. Of note, patient is lactose intolerant post her gastric bypass. Will plan to check vitamin labs as recommended by the [facility].   Per chart, pt is up ~5lbs since admit.       Medications reviewed and include: folic acid, oxycodone, protonix, thiamine, cefepime, 10% dextrose @75ml /hr  Labs reviewed: K 3.3(L), creat 1.18(H), P 2.3(L), Mg 1.7 wnl Iron 59, TIBC <70(L), ferritin 247, folate 4.7(L), B12 >7,500(H)- 1/7 Hgb 7.9(L), Hct 23.8(L), MCV 100.4(H) cbgs- 128, 71, 63, 240, 95 x 24 hrs  Diet Order:   Diet Order            Diet NPO time specified  Diet effective now             EDUCATION NEEDS:   Not appropriate for education at this time  Skin:  Skin Assessment: Reviewed RN Assessment  Last BM:  1/7- type 7  Height:   Ht Readings from Last 1 Encounters:  05/02/19 5' (1.524 m)    Weight:   Wt Readings from Last 1 Encounters:  05/03/19 57.1 kg    Ideal Body Weight:  45.4 kg(using ht from 2019)  BMI:  Body mass index is 24.58 kg/m.  Estimated Nutritional Needs:   Kcal:  1500-1700kcal/day  Protein:  75-85g/day  Fluid:  >1.4L/day  Koleen Distance MS, RD, LDN Pager #- [phone] Office#- [phone] After Hours Pager: [phone]

## 2019-05-04 NOTE — Progress Notes (Signed)
Assessed patient for PIV, no suitable veins found,veins are small in both forearm.the patient have multiple irritant medications. May recommend central line or interventional radiology.

## 2019-05-04 NOTE — Anesthesia Procedure Notes (Signed)
Procedure Name: Intubation Date/Time: 05/04/2019 12:46 PM Performed by: Griffin Dakin, CRNA Pre-anesthesia Checklist: Patient identified, Emergency Drugs available, Suction available and Patient being monitored Patient Re-evaluated:Patient Re-evaluated prior to induction Oxygen Delivery Method: Circle system utilized Preoxygenation: Pre-oxygenation with 100% oxygen Induction Type: IV induction and Rapid sequence Laryngoscope Size: Mac and 3 Grade View: Grade I Tube type: Oral Tube size: 7.0 mm Number of attempts: 1 Airway Equipment and Method: Stylet Placement Confirmation: ETT inserted through vocal cords under direct vision,  positive ETCO2 and breath sounds checked- equal and bilateral Secured at: 22 cm Tube secured with: Tape Dental Injury: Teeth and Oropharynx as per pre-operative assessment

## 2019-05-04 NOTE — Plan of Care (Signed)
  Problem: Clinical Measurements: Goal: Ability to maintain clinical measurements within normal limits will improve Outcome: Progressing    Problem: Nutrition: Goal: Adequate nutrition will be maintained Outcome: Progressing   Problem: Coping: Goal: Level of anxiety will decrease Outcome: Progressing  Problem: Nutrition: Goal: Adequate nutrition will be maintained Outcome: Progressing

## 2019-05-04 NOTE — Anesthesia Preprocedure Evaluation (Signed)
Anesthesia Evaluation  Patient identified by MRN, date of birth, ID band Patient awake    Reviewed: Allergy & Precautions, NPO status , Patient's Chart, lab work & pertinent test results  Airway Mallampati: I  TM Distance: >3 FB Neck ROM: Full    Dental no notable dental hx. (+) Edentulous Lower, Edentulous Upper   Pulmonary neg pulmonary ROS, former smoker,    Pulmonary exam normal breath sounds clear to auscultation       Cardiovascular hypertension, Pt. on medications Normal cardiovascular exam Rhythm:Regular Rate:Normal     Neuro/Psych negative neurological ROS  negative psych ROS   GI/Hepatic GERD  ,  Endo/Other  diabetes  Renal/GU K+ 3.3 Cr 1.18      Musculoskeletal negative musculoskeletal ROS (+)   Abdominal   Peds  Hematology   Anesthesia Other Findings   Reproductive/Obstetrics                             Anesthesia Physical Anesthesia Plan  ASA: III  Anesthesia Plan: General   Post-op Pain Management:    Induction: Intravenous, Rapid sequence and Cricoid pressure planned  PONV Risk Score and Plan: 3 and Treatment may vary due to age or medical condition, Ondansetron, Dexamethasone and Midazolam  Airway Management Planned: Oral ETT  Additional Equipment: None  Intra-op Plan:   Post-operative Plan: Extubation in OR  Informed Consent: I have reviewed the patients History and Physical, chart, labs and discussed the procedure including the risks, benefits and alternatives for the proposed anesthesia with the patient or authorized representative who has indicated his/her understanding and acceptance.     Dental advisory given  Plan Discussed with:   Anesthesia Plan Comments:        Anesthesia Quick Evaluation

## 2019-05-04 NOTE — Consult Note (Signed)
Angie [name] [birth year]  [id number].    Requesting MD: Dr. [name] Chief Complaint/Reason for Consult: Malnutrition, Hx of Roux-en-Y    HPI: Angie [name] is a 54 y.o. female that was admitted for hypoglycemia, failure to thrive, and malnutrition.  Patient has a history of of gastric bypass for weight loss in 2007.  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 She ultimately underwent a laparoscopic revision of gastrojejunostomy and insertion of gastrostomy tube to remnant stomach in 2019 by Dr. Kieth Brightly.  G-tube has since been removed. She reports over the last 4-5 months she has been having increasing difficulties with dysphagia, worsening the last 3-4 weeks.  She reports that anytime she eats or drinks she feels it has difficulty going down her throat and usually will have emesis after eating. This includes solids and liquids. On 1/3, she presented to the ED after EMS found patient hypoglycemic with CBG of 23.  She was admitted to medicine and GI consulted.  She underwent a EGD on 1/6 that showed no stricture or stenosis and a small gastric remnant. She underwent upper GI yesterday that showed slow clearance of the esophagus with esophageal dysmotility, no stricture.  This also showed small gastric pouch with immediate emptying into the small bowel.  GI asked for general surgery to see.  Patient seen with Dr. Kieth Brightly.   ROS: Review of Systems  Constitutional: Positive for weight loss. Negative for chills and fever.  Respiratory: Negative for cough.   Cardiovascular: Negative for chest pain.  Gastrointestinal: Positive for diarrhea, heartburn, nausea and vomiting. Negative for abdominal pain and constipation.  Genitourinary: Negative for dysuria.  All other systems reviewed and are negative.   Family History  Problem  Relation Age of Onset  . Diabetes Father   . Heart disease Father   . Depression Maternal Grandmother   . Heart disease Maternal Grandfather   . Depression Paternal Grandmother   . Colon cancer Paternal Grandfather   . Pancreatic cancer Paternal Grandfather     Past Medical History:  Diagnosis Date  . Abnormal Pap smear   . Alcohol abuse   . Anemia    IDA  . Anxiety    Takes xanax  . Arthritis    left hip/knees, lower spine  . Bipolar disorder ([place])   . Blood transfusion without reported diagnosis last done 04-25-17  . Bulging lumbar disc   . Bursitis    left shoulder  . Chronic lower back pain   . Cut    right middle finger with knife small cut healing pt instructed to keep clean and dry no redness or drainage  . Depression   . Diverticulitis   . Fibromyalgia   . GERD (gastroesophageal reflux disease)   . Hypertension    none since weight loss  . Lactose intolerance in adult 2017  . Migraines    "weekly @ least" (04/26/2016)  . Seizures ([place])    one Dec. 2017 due to throat closing  . Type II diabetes mellitus ([place])    "before the gastric bypass" (04/26/2016) states she no longer has diabetes    Past Surgical History:  Procedure Laterality Date  . BALLOON DILATION N/A 05/27/2016   Procedure: BALLOON DILATION;  Surgeon: Doran Stabler, MD;  Location: [facility];  Service: Gastroenterology;  Laterality: N/A;  . BALLOON DILATION N/A 04/21/2017  Procedure: BALLOON DILATION;  Surgeon: Doran Stabler, MD;  Location: [facility];  Service: Gastroenterology;  Laterality: N/A;  . CERVICAL CONE BIOPSY    . [place]; 1996; 1998; 2014  . CESAREAN SECTION WITH BILATERAL TUBAL LIGATION Bilateral 10/28/2012   Procedure: Repeat cesarean section with delivery of baby boy at 22. Apgars 8/9.  BILATERAL TUBAL LIGATION;  Surgeon: Florian Buff, MD;  Location: [facility];  Service: Obstetrics;  Laterality: Bilateral;  . DILATION AND CURETTAGE OF UTERUS    .  ESOPHAGOGASTRODUODENOSCOPY N/A 05/27/2016   Procedure: ESOPHAGOGASTRODUODENOSCOPY (EGD);  Surgeon: Doran Stabler, MD;  Location: [facility];  Service: Gastroenterology;  Laterality: N/A;  . ESOPHAGOGASTRODUODENOSCOPY (EGD) WITH PROPOFOL N/A 04/23/2016   Procedure: ESOPHAGOGASTRODUODENOSCOPY (EGD) WITH PROPOFOL;  Surgeon: Doran Stabler, MD;  Location: [facility];  Service: Endoscopy;  Laterality: N/A;  . ESOPHAGOGASTRODUODENOSCOPY (EGD) WITH PROPOFOL N/A 01/03/2017   Procedure: ESOPHAGOGASTRODUODENOSCOPY (EGD) WITH PROPOFOL;  Surgeon: Doran Stabler, MD;  Location: [facility];  Service: Gastroenterology;  Laterality: N/A;  . ESOPHAGOGASTRODUODENOSCOPY (EGD) WITH PROPOFOL N/A 04/21/2017   Procedure: ESOPHAGOGASTRODUODENOSCOPY (EGD) WITH PROPOFOL;  Surgeon: Doran Stabler, MD;  Location: [facility];  Service: Gastroenterology;  Laterality: N/A;  . ESOPHAGOGASTRODUODENOSCOPY (EGD) WITH PROPOFOL N/A 05/02/2019   Procedure: ESOPHAGOGASTRODUODENOSCOPY (EGD) WITH PROPOFOL;  Surgeon: Irene Shipper, MD;  Location: [facility];  Service: Endoscopy;  Laterality: N/A;  . GASTROSTOMY N/A 08/16/2016   Procedure: LAPAROSCOPIC INSERTION OF GASTROSTOMY TUBE IN REMNANT STOMACH;  Surgeon: Arta Bruce Kinsinger, MD;  Location: [facility];  Service: General;  Laterality: N/A;  . GASTROSTOMY N/A 05/16/2017   Procedure: Replacement of  GASTROSTOMY TUBE;  Surgeon: Kieth Brightly Arta Bruce, MD;  Location: [facility];  Service: General;  Laterality: N/A;  . LAPAROSCOPIC REVISION OF GASTROJEJUNOSTOMY Left 05/16/2017   Procedure: LAPAROSCOPIC REVISION OF GASTROJEJUNOSTOMY;  Surgeon: [name], MD;  Location: [facility];  Service: General;  Laterality: Left;  . ROUX-EN-Y GASTRIC BYPASS  2007  . TUBAL LIGATION  2014    Social History:  reports that she quit smoking about 7 years ago. Her smoking use included cigarettes. She has a 2.00 pack-year smoking history. She has never used smokeless tobacco. She reports  current alcohol use of about 17.0 standard drinks of alcohol per week. She reports that she does not use drugs.  Allergies:  Allergies  Allergen Reactions  . Iron Anaphylaxis    Had acute allergy with tachycardia, attention and generalized itching shortly after receiving nulecit ( iron gluconate) infusion.    Medications Prior to Admission  Medication Sig Dispense Refill  . ALPRAZolam (XANAX) 1 MG tablet Take 1 tablet (1 mg total) by mouth at bedtime as needed for anxiety. (Patient taking differently: Take 1 mg by mouth 3 (three) times daily. ) 30 tablet 0  . CARAFATE 1 GM/10ML suspension Take 10 mLs by mouth 3 (three) times daily before meals.  4  . cyclobenzaprine (FLEXERIL) 10 MG tablet Take 10 mg by mouth 3 (three) times daily.  0  . famotidine (PEPCID) 40 MG tablet Take 40 mg by mouth daily.    Marland Kitchen GAS RELIEF 20 MG/0.3ML drops Take 1 mL by mouth every 6 (six) hours as needed for flatulence.   2  . lidocaine (XYLOCAINE) 5 % ointment Apply 1 application topically 3 (three) times daily. Apply to Bilateral shoulders    . lisinopril-hydrochlorothiazide (ZESTORETIC) 10-12.5 MG tablet Take 1 tablet by mouth daily.    . Multiple  Vitamins-Minerals (MULTIVITAMIN WITH IRON-MINERALS) liquid Take 5 mLs by mouth daily.    Marland Kitchen OLANZapine (ZYPREXA) 2.5 MG tablet Take 2.5 mg by mouth daily.  2  . oxyCODONE (ROXICODONE) 15 MG immediate release tablet Take 15 mg by mouth every 6 (six) hours.  0  . oxymetazoline (AFRIN) 0.05 % nasal spray Place 2 sprays into both nostrils 2 (two) times daily as needed for congestion.    . QUEtiapine (SEROQUEL) 50 MG tablet Take 50 mg by mouth at bedtime.       Physical Exam: Blood pressure 108/81, pulse (!) 101, temperature (!) 97.5 F (36.4 C), temperature source Oral, resp. rate 16, height 5' (1.524 m), weight 57.1 kg, SpO2 100 %. General: pleasant, malnourished appearing female who is laying in bed in NAD HEENT: head is normocephalic, atraumatic.  Sclera are  noninjected.  PERRL.  Ears and nose without any masses or lesions.  Mouth is pink and moist Heart: regular, rate, and rhythm.  Palpable radial and pedal pulses bilaterally Lungs: CTAB, no wheezes, rhonchi, or rales noted.  Respiratory effort nonlabored Abd: Soft, NT, ND, +BS. Prior laparoscopic scars well healed MS: Noted LE edema b/l Skin: warm and dry with no masses, lesions, or rashes Psych: A&Ox3 with an appropriate affect.   Results for orders placed or performed during the hospital encounter of 04/29/19 (from the past 48 hour(s))  Glucose, capillary     Status: Abnormal   Collection Time: 05/02/19  9:17 AM  Result Value Ref Range   Glucose-Capillary 64 (L) 70 - 99 mg/dL  Glucose, capillary     Status: Abnormal   Collection Time: 05/02/19 10:07 AM  Result Value Ref Range   Glucose-Capillary 101 (H) 70 - 99 mg/dL  Glucose, capillary     Status: Abnormal   Collection Time: 05/02/19 10:44 AM  Result Value Ref Range   Glucose-Capillary 69 (L) 70 - 99 mg/dL  Glucose, capillary     Status: Abnormal   Collection Time: 05/02/19 10:45 AM  Result Value Ref Range   Glucose-Capillary 67 (L) 70 - 99 mg/dL  Glucose, capillary     Status: Abnormal   Collection Time: 05/02/19 11:11 AM  Result Value Ref Range   Glucose-Capillary 155 (H) 70 - 99 mg/dL  Glucose, capillary     Status: None   Collection Time: 05/02/19 12:11 PM  Result Value Ref Range   Glucose-Capillary 98 70 - 99 mg/dL  Glucose, capillary     Status: None   Collection Time: 05/02/19 12:56 PM  Result Value Ref Range   Glucose-Capillary 91 70 - 99 mg/dL  Glucose, capillary     Status: Abnormal   Collection Time: 05/02/19  4:06 PM  Result Value Ref Range   Glucose-Capillary 105 (H) 70 - 99 mg/dL  Glucose, capillary     Status: None   Collection Time: 05/02/19  6:07 PM  Result Value Ref Range   Glucose-Capillary 73 70 - 99 mg/dL  Glucose, capillary     Status: None   Collection Time: 05/02/19  7:20 PM  Result Value Ref  Range   Glucose-Capillary 81 70 - 99 mg/dL  Glucose, capillary     Status: Abnormal   Collection Time: 05/02/19  8:28 PM  Result Value Ref Range   Glucose-Capillary 55 (L) 70 - 99 mg/dL  Glucose, capillary     Status: None   Collection Time: 05/02/19  9:25 PM  Result Value Ref Range   Glucose-Capillary 85 70 - 99 mg/dL  Glucose, capillary  Status: Abnormal   Collection Time: 05/02/19 10:28 PM  Result Value Ref Range   Glucose-Capillary 102 (H) 70 - 99 mg/dL  Glucose, capillary     Status: Abnormal   Collection Time: 05/02/19 11:30 PM  Result Value Ref Range   Glucose-Capillary 137 (H) 70 - 99 mg/dL  Glucose, capillary     Status: Abnormal   Collection Time: 05/03/19  1:07 AM  Result Value Ref Range   Glucose-Capillary 100 (H) 70 - 99 mg/dL  Glucose, capillary     Status: None   Collection Time: 05/03/19  2:22 AM  Result Value Ref Range   Glucose-Capillary 84 70 - 99 mg/dL  Glucose, capillary     Status: Abnormal   Collection Time: 05/03/19  4:02 AM  Result Value Ref Range   Glucose-Capillary 63 (L) 70 - 99 mg/dL  Glucose, capillary     Status: Abnormal   Collection Time: 05/03/19  4:53 AM  Result Value Ref Range   Glucose-Capillary 252 (H) 70 - 99 mg/dL  Comprehensive metabolic panel     Status: Abnormal   Collection Time: 05/03/19  4:55 AM  Result Value Ref Range   Sodium 140 135 - 145 mmol/L   Potassium 3.3 (L) 3.5 - 5.1 mmol/L    Comment: NO VISIBLE HEMOLYSIS   Chloride 117 (H) 98 - 111 mmol/L   CO2 15 (L) 22 - 32 mmol/L   Glucose, Bld 283 (H) 70 - 99 mg/dL   BUN 16 6 - 20 mg/dL   Creatinine, Ser 1.12 (H) 0.44 - 1.00 mg/dL   Calcium 7.9 (L) 8.9 - 10.3 mg/dL   Total Protein 4.4 (L) 6.5 - 8.1 g/dL   Albumin 1.7 (L) 3.5 - 5.0 g/dL   AST 23 15 - 41 U/L   ALT 25 0 - 44 U/L   Alkaline Phosphatase 109 38 - 126 U/L   Total Bilirubin 1.0 0.3 - 1.2 mg/dL   GFR calc non Af Amer 56 (L) >60 mL/min   GFR calc Af Amer >60 >60 mL/min   Anion gap 8 5 - 15    Comment:  Performed at [facility], [place] [address] [place], [place] [address]  [facility]     Status: Abnormal   Collection Time: 05/03/19  4:55 AM  Result Value Ref Range   WBC 4.4 4.0 - 10.5 K/uL   RBC 2.37 (L) 3.87 - 5.11 MIL/uL   Hemoglobin 8.0 (L) 12.0 - 15.0 g/dL   HCT 23.7 (L) 36.0 - 46.0 %   MCV 100.0 80.0 - 100.0 fL   MCH 33.8 26.0 - 34.0 pg   MCHC 33.8 30.0 - 36.0 g/dL   RDW 18.8 (H) 11.5 - 15.5 %   Platelets 136 (L) 150 - 400 K/uL   nRBC 0.0 0.0 - 0.2 %    Comment: Performed at [facility], [place] [address], [place],  [address]  Procalcitonin     Status: None   Collection Time: 05/03/19  4:55 AM  Result Value Ref Range   Procalcitonin 0.99 ng/mL    Comment:        Interpretation: PCT > 0.5 ng/mL and <= 2 ng/mL: Systemic infection (sepsis) is possible, but other conditions are known to elevate PCT as well. (NOTE)       Sepsis PCT Algorithm           Lower Respiratory Tract  Infection PCT Algorithm    ----------------------------     ----------------------------         PCT < 0.25 ng/mL                PCT < 0.10 ng/mL         Strongly encourage             Strongly discourage   discontinuation of antibiotics    initiation of antibiotics    ----------------------------     -----------------------------       PCT 0.25 - 0.50 ng/mL            PCT 0.10 - 0.25 ng/mL               OR       >80% decrease in PCT            Discourage initiation of                                            antibiotics      Encourage discontinuation           of antibiotics    ----------------------------     -----------------------------         PCT >= 0.50 ng/mL              PCT 0.26 - 0.50 ng/mL                AND       <80% decrease in PCT             Encourage initiation of                                             antibiotics       Encourage continuation           of antibiotics    ----------------------------      -----------------------------        PCT >= 0.50 ng/mL                  PCT > 0.50 ng/mL               AND         increase in PCT                  Strongly encourage                                      initiation of antibiotics    Strongly encourage escalation           of antibiotics                                     -----------------------------                                           PCT <= 0.25 ng/mL  OR                                        > 80% decrease in PCT                                     Discontinue / Do not initiate                                             antibiotics Performed at [facility], [place] [address], [place], [place] [address]   Magnesium     Status: None   Collection Time: 05/03/19  4:55 AM  Result Value Ref Range   Magnesium 2.0 1.7 - 2.4 mg/dL    Comment: Performed at [facility] [address] [place],  [address]  Phosphorus     Status: None   Collection Time: 05/03/19  4:55 AM  Result Value Ref Range   Phosphorus 3.0 2.5 - 4.6 mg/dL    Comment: Performed at [facility] [address], [place], [place] [address]  Glucose, capillary     Status: None   Collection Time: 05/03/19  6:37 AM  Result Value Ref Range   Glucose-Capillary 86 70 - 99 mg/dL  Glucose, capillary     Status: Abnormal   Collection Time: 05/03/19  7:48 AM  Result Value Ref Range   Glucose-Capillary 67 (L) 70 - 99 mg/dL  Glucose, capillary     Status: None   Collection Time: 05/03/19  9:14 AM  Result Value Ref Range   Glucose-Capillary 81 70 - 99 mg/dL  Glucose, capillary     Status: Abnormal   Collection Time: 05/03/19 10:46 AM  Result Value Ref Range   Glucose-Capillary 128 (H) 70 - 99 mg/dL  Glucose, capillary     Status: None   Collection Time: 05/03/19 12:00 PM  Result Value Ref Range   Glucose-Capillary 87 70 - 99 mg/dL  Vitamin B12     Status: Abnormal   Collection Time: 05/03/19  12:21 PM  Result Value Ref Range   Vitamin B-12 >7,500 (H) 180 - 914 pg/mL    Comment: Performed at [facility], [place] [address], [place], [place] [address]  Glucose, capillary     Status: Abnormal   Collection Time: 05/03/19  2:53 PM  Result Value Ref Range   Glucose-Capillary 137 (H) 70 - 99 mg/dL  Glucose, capillary     Status: Abnormal   Collection Time: 05/03/19  5:25 PM  Result Value Ref Range   Glucose-Capillary 126 (H) 70 - 99 mg/dL  Glucose, capillary     Status: Abnormal   Collection Time: 05/03/19  7:51 PM  Result Value Ref Range   Glucose-Capillary 162 (H) 70 - 99 mg/dL  Glucose, capillary     Status: Abnormal   Collection Time: 05/03/19  8:53 PM  Result Value Ref Range   Glucose-Capillary 142 (H) 70 - 99 mg/dL  Glucose, capillary     Status: None   Collection Time: 05/03/19  9:52 PM  Result Value Ref Range   Glucose-Capillary 99 70 - 99 mg/dL  Glucose, capillary     Status: None  Collection Time: 05/03/19 10:54 PM  Result Value Ref Range   Glucose-Capillary 92 70 - 99 mg/dL  Glucose, capillary     Status: Abnormal   Collection Time: 05/03/19 11:53 PM  Result Value Ref Range   Glucose-Capillary 134 (H) 70 - 99 mg/dL  Glucose, capillary     Status: Abnormal   Collection Time: 05/04/19 12:55 AM  Result Value Ref Range   Glucose-Capillary 122 (H) 70 - 99 mg/dL  Glucose, capillary     Status: Abnormal   Collection Time: 05/04/19  1:57 AM  Result Value Ref Range   Glucose-Capillary 112 (H) 70 - 99 mg/dL  Comprehensive metabolic panel     Status: Abnormal   Collection Time: 05/04/19  2:41 AM  Result Value Ref Range   Sodium 137 135 - 145 mmol/L   Potassium 3.3 (L) 3.5 - 5.1 mmol/L   Chloride 116 (H) 98 - 111 mmol/L   CO2 14 (L) 22 - 32 mmol/L   Glucose, Bld 110 (H) 70 - 99 mg/dL   BUN 18 6 - 20 mg/dL   Creatinine, Ser 1.18 (H) 0.44 - 1.00 mg/dL   Calcium 7.7 (L) 8.9 - 10.3 mg/dL   Total Protein 4.2 (L) 6.5 - 8.1 g/dL   Albumin 1.6 (L) 3.5 - 5.0 g/dL    AST 20 15 - 41 U/L   ALT 23 0 - 44 U/L   Alkaline Phosphatase 113 38 - 126 U/L   Total Bilirubin 0.7 0.3 - 1.2 mg/dL   GFR calc non Af Amer 53 (L) >60 mL/min   GFR calc Af Amer >60 >60 mL/min   Anion gap 7 5 - 15    Comment: Performed at [facility], [address], [place], [place] [address]  [facility]     Status: Abnormal   Collection Time: 05/04/19  2:41 AM  Result Value Ref Range   WBC 6.7 4.0 - 10.5 K/uL   RBC 2.37 (L) 3.87 - 5.11 MIL/uL   Hemoglobin 7.9 (L) 12.0 - 15.0 g/dL   HCT 23.8 (L) 36.0 - 46.0 %   MCV 100.4 (H) 80.0 - 100.0 fL   MCH 33.3 26.0 - 34.0 pg   MCHC 33.2 30.0 - 36.0 g/dL   RDW 18.6 (H) 11.5 - 15.5 %   Platelets 122 (L) 150 - 400 K/uL   nRBC 0.0 0.0 - 0.2 %    Comment: Performed at [facility], [place] [address] [place], [place] [address]  Magnesium     Status: None   Collection Time: 05/04/19  2:41 AM  Result Value Ref Range   Magnesium 1.7 1.7 - 2.4 mg/dL    Comment: Performed at [facility] [address], [place], [place] [address]  Phosphorus     Status: Abnormal   Collection Time: 05/04/19  2:41 AM  Result Value Ref Range   Phosphorus 2.3 (L) 2.5 - 4.6 mg/dL    Comment: Performed at [facility] [address], [place], [place] [address]  Glucose, capillary     Status: None   Collection Time: 05/04/19  2:59 AM  Result Value Ref Range   Glucose-Capillary 91 70 - 99 mg/dL  Glucose, capillary     Status: None   Collection Time: 05/04/19  3:49 AM  Result Value Ref Range   Glucose-Capillary 85 70 - 99 mg/dL  Glucose, capillary     Status: None   Collection Time: 05/04/19  4:44 AM  Result Value  Ref Range   Glucose-Capillary 86 70 - 99 mg/dL  Glucose, capillary     Status: None   Collection Time: 05/04/19  5:52 AM  Result Value Ref Range   Glucose-Capillary 87 70 - 99 mg/dL  Glucose, capillary     Status: Abnormal   Collection Time: 05/04/19  8:03 AM  Result Value Ref Range   Glucose-Capillary 61 (L) 70 - 99 mg/dL   Glucose, capillary     Status: Abnormal   Collection Time: 05/04/19  8:57 AM  Result Value Ref Range   Glucose-Capillary 128 (H) 70 - 99 mg/dL   US RENAL  Result Date: 05/03/2019 CLINICAL DATA:  54 year old female with questionable hydronephrosis. Prominent renal pyramids on recent ultrasound. EXAM: RENAL / URINARY TRACT ULTRASOUND COMPLETE COMPARISON:  CT Chest, Abdomen, and Pelvis 04/30/2019, and abdomen ultrasound 04/30/2019. FINDINGS: Right Kidney: Renal measurements: 9.2 x 4.6 x 5.3 centimeters = volume: 117 mL. Conspicuous corticomedullary differentiation again noted, this might indicate hyperechogenicity of the renal cortex, which is isoechoic to the liver. No hydronephrosis or right renal mass. Left Kidney: Renal measurements: 8.7 x 4.7 x 4.8 centimeters = volume: 103 mL. Similar exaggerated corticomedullary differentiation to that on the right. No left hydronephrosis or left renal mass. Bladder: Appears normal for degree of bladder distention. Other: None. IMPRESSION: 1. No hydronephrosis or acute renal findings. 2. Exaggerated corticomedullary differentiation/renal pyramids again noted, perhaps related to increased cortical echogenicity from chronic medical renal disease. Electronically Signed   By: Genevie Ann M.D.   On: 05/03/2019 15:40   DG UGI W SINGLE CM (SOL OR THIN BA)  Result Date: 05/03/2019 CLINICAL DATA:  Remote Roux-en-Y gastric bypass. Nausea and vomiting. Evaluate pouch and emptying. EXAM: UPPER GI SERIES WITH KUB TECHNIQUE: After obtaining a scout radiograph a routine upper GI series was performed using thin barium FLUOROSCOPY TIME:  Fluoroscopy Time:  2 minutes 24 seconds Radiation Exposure Index (if provided by the fluoroscopic device): Number of Acquired Spot Images: 0 COMPARISON:  CT 04/30/2019 FINDINGS: Fluoroscopic evaluation of swallowing demonstrates poor esophageal peristalsis with full column stasis and slow emptying of the esophagus. No fixed stricture. There is a very  small gastric pouch with immediate emptying of the pouch in to small bowel. Small bowel is normal caliber. IMPRESSION: Very small pouch with immediate emptying into small bowel. Slow clearance of the esophagus with esophageal dysmotility. No fixed stricture. Electronically Signed   By: Rolm Baptise M.D.   On: 05/03/2019 09:22   Anti-infectives (From admission, onward)   Start     Dose/Rate Route Frequency Ordered Stop   05/01/19 0300  vancomycin (VANCOREADY) IVPB 500 mg/100 mL  Status:  Discontinued     500 mg 100 mL/hr over 60 Minutes Intravenous Every 24 hours 04/30/19 0833 05/02/19 1348   04/30/19 2200  vancomycin (VANCOREADY) IVPB 500 mg/100 mL  Status:  Discontinued     500 mg 100 mL/hr over 60 Minutes Intravenous Every 24 hours 04/30/19 0103 04/30/19 0833   04/30/19 1000  ceFEPIme (MAXIPIME) 2 g in sodium chloride 0.9 % 100 mL IVPB     2 g 200 mL/hr over 30 Minutes Intravenous Every 12 hours 04/30/19 0103     04/29/19 2300  vancomycin (VANCOCIN) IVPB 1000 mg/200 mL premix     1,000 mg 200 mL/hr over 60 Minutes Intravenous  Once 04/29/19 2247 04/30/19 0447   04/29/19 2300  ceFEPIme (MAXIPIME) 2 g in sodium chloride 0.9 % 100 mL IVPB     2 g 200  mL/hr over 30 Minutes Intravenous  Once 04/29/19 2247 04/30/19 0020        Assessment/Plan DM CKD HTN GERD Hx seizures  Chronic anemia  Hypoglycemia - resolved  FTT  Protein Calorie Malnutrition (pre-alb 9.9) Dysphagia and Postprandial emesis  - Patient with Hx of Roux-en-Y in 2007. 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 She ultimately underwent a laparoscopic revision of gastrojejunostomy and insertion of gastrostomy tube to remnant stomach in 2019 by Dr. Kieth Brightly. G-tube has since been removed.  -  EGD on 1/6 that showed no stricture or stenosis and a small gastric  remnant.  - Upper GI 1/7 showed slow clearance of the esophagus with esophageal dysmotility, no stricture.  This also showed small gastric pouch with immediate emptying into the small bowel.   - IR did note feel patients anatomy was amenable to percutaneous gastrostomy tube placement  - Will plan for laparoscopic insertion of gastrostomy tube today with Dr. Kieth Brightly. Consent signed and placed in chart.   FEN - NPO for procedure VTE - SCDs ID - On Maxipime   Jillyn Ledger, [facility] Surgery 05/04/2019, 9:13 AM Please see Amion for pager number during day hours 7:00am-4:30pm

## 2019-05-04 NOTE — Op Note (Signed)
Preoperative diagnosis: malnutrition  Postoperative diagnosis: same   Procedure: laparoscopic gastrostomy tube insertion  Surgeon: Gurney Maxin, M.D.  Asst: none  Anesthesia: general  Indications for procedure: Angie [name] is a 54 y.o. 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  Description of procedure: The patient was brought into the operative suite. Anesthesia was administered with General endotracheal anesthesia. WHO checklist was applied. The patient was then placed in supine position. The area was prepped and draped in the usual sterile fashion.  Next, a small right subcostal incision was made. A 43mm trocar was used to gain access to the peritoneal cavity by optical entry technique. Pneumoperitoneum was applied with a high flow and low pressure. The laparoscope was reinserted to confirm position. There was some blood on initial view. On evaluation there was a small injury to the liver.  In viewing the abdomen the remnant stomach was adhered to the abdominal wall. A 16 ga needle was inserted and insufflated the stomach to confirm position. A j-wire was introduced and the tract was dilated up to 20 fr. A 18 fr g tube was inserted and secured at 2 cm at the skin with a 2-0 nylon and 10 ml of saline was inserted into the balloon. Bilateral TAP blocks were placed with marcaine.  Cautery was used to gain hemostasis. [name] was placed on top as well. Pneumoperitoneum was removed. Trocars were removed. The skin incisions were closed with 4-0 monocryl. Dermabond was placed for dressing. The patient tolerated the procedure well. All counts were correct.   Findings: stomach scarred to the abdominal wall  Specimen: none  Implant: 18 fr gastrostomy tube to remnant stomach   Blood loss: 30 ml  Local anesthesia: 20 ml marcaine   Complications: liver injury requiring cautery and snow placed  Gurney Maxin, M.D. General,  Bariatric, & Minimally Invasive Surgery [facility] [place]

## 2019-05-04 NOTE — Progress Notes (Signed)
PROGRESS NOTE    Angie [name]  [id number] DOB: 1966/01/25 DOA: 04/29/2019 PCP: Harvie Junior, MD   Brief Narrative: 54 year old female with history of gastric bypass surgery prior to which she had a history of diabetes mellitus CKD stage III, chronic anemia, chronic malnutrition with history of esophageal strictures, bipolar disorder and chronic pain, chronic anemia was found unresponsive by patient's boyfriend when he returned back from work after 12-hour shift.  Patient is lying in recliner and was found to be in the 70s.  EMS was called and found her sugar was 23 was given glucagon and D50 and brought to the ER.  To admitting physician patient stated "that she has been feeling increasingly weak over the last 3 weeks with increasing peripheral edema of both lower extremity and difficulty ambulate with patient planning to see a neurologist because of the difficulty ambulating at no incontinence of urine or bowel or any decrease sensation of the extremities.  Patient did have a fall about a week ago when she tripped on a vanity in which fell on her." On phone per her fiancee since march of last year she has been losing weight, he thinks her esophagus is closing up again. Had appointment with her surgeon this week. Has good appetite but not abel to swallow. She is very fatigued easily with minimal activity. Only eating soft food and soup. Only little bit of regular food. She started to have leg swelling recently.  In [id number] more alert awake but was hypothermic with temperature of 93 F.  Patient became again hypoglycemic and had to be started on D10.  Blood cultures procalcitonin levels cortisol level TSH was sent.  Patient had CT head and C-spine followed by CT chest and abdomen.  CT head and C-spine were unremarkable.  CT chest and abdomen was showing third spacing of fluid and hepatic steatosis.  Pulmonary nodule also seen.  Covid test negative.  Labs show creatinine 1.5 albumin of 2.4  hemoglobin 7.4 which further decreased to 6.4 WBC was 2.  Patient was started on D10 and admitted for further management of hypothermia hypoglycemia with elevated procalcitonin concerning for infection for which antibiotics were started Patient was admitted-with empiric antibiotics IV fluids and dextrose infusion. Mental status has been lethargic.Patient on chronic Xanax, high-dose oxycodone and Seroquel and dose was cut down with improvement in her mental status. Continues to have poor oral intake, 1/5-GI consulted 1/6-status post EGD  Subjective:  Seen this morning.  She is alert awake.  Reports she saw her gastric bypass surgeon and planning for procedure this afternoon. Blood sugar has been running low. Remains afebrile, T minimum was 97.  Limit 3.3, creatinine 1.18, hemoglobin 7.9   Assessment & Plan:   Refractory hypoglycemia:suspecting due to poor nutritional status/severe protein calorie malnutrition with poor oral intake. Random cortisol on admission was 25.7 ruled out adrenalinsufficiency ( also underwent cosyntropin test).  C-peptide level normal, random insulin level low, proinsulin/insulin ratio IGF low at [address] [place] intermittently hypoglycemic specially with n.p.o. status, continue increase  D10 infusion. Hopefully can start diet after procedure. Recent Labs  Lab 05/04/19 1002 05/04/19 1100 05/04/19 1157 05/04/19 1428 05/04/19 1536  GLUCAP 71 63* 240* 95 118*   Postprandial vomiting dysphagia failure to thrive weight loss: Is post EGD 1/6: Roux-en-Y gastric bypass anatomy, no stricture, no stenosis.  Tiny gastric remnant suspect gastric remnant too small to allow for proper nutritional absorption and is causing her difficulties with metabolic abnormalities, weight loss, feeding problems.1/7-upper  GI series confirms very small gastric remnant which immediately empties into the small intestine. Additionally esophageal dysmotility present with slow esophageal clearance, no  stricture.Appreciate GI input.  Consulted bariatric surgery Dr. Lavada Mesi plan for gastrostomy tube today and perform surgical reversal in a few months.   She had GJ revision in 02/2018.   Hyponatremia:Likely from poor oral intake.Resolved. Hypomagnesemia: We will replete.  Hypokalemia: Being repleted. Metabolic acidosis with bicarb 10-->13-15  Hypothermia: Likely due to her severe malnutrition/hypoglycemia.Rule out sepsis.  Pro Cal was elevated on admission 7.5.  -repeat-procalcitonin 0.99,Lactic acid normal. Oon empiric vancomycin and cefepime on admission. COVID-19 negative blood culture 1/3  Neg so far. UA on admissions negative and CT chest and abdomen no significant finding.  MRCP-no evidence of cholecystitis no CBD dilatation gallbladder with sludge and distended.  Vancomycin discontinued 1/6 .  At this time do not suspect infection will discontinue antibiotics  Severe hepatic steatosis:Noted in MRCP.  CBD 7 mm without intrahepatic biliary dilation. unclear etiology question due to malnutrition.    Distended gallbladder with sludge- monitor lfts. No RUQ tenderness.  Moderate bilateral hydronephrosis and hydroureter cont as needed  with bladder scan.unremarkable renal ultrasound.  Macrocytic anemia/folate deficiency:Vitamin B12 high(level very high likely after injection), ferritin 247, iron 59, folate low at less than 4.7-started on iv folate/thiamine supplementation. Fecal occult blood was negative.  Patient received 1 unit PRBC for hemoglobin of 6.4 g. Monitor. Recent Labs  Lab 04/30/19 0554 05/01/19 0949 05/02/19 0334 05/03/19 0455 05/04/19 0241  HGB 9.0* 10.0* 8.8* 8.0* 7.9*  HCT 28.3* 30.0* 26.1* 23.7* 23.8*   Leukopenia WBC improved.  Monitor. Recent Labs  Lab 04/30/19 0554 05/01/19 0949 05/02/19 0334 05/03/19 0455 05/04/19 0241  WBC 5.9 1.8* 5.0 4.4 6.7   Bilateral lower extremity edema/anasarca/third spacing: Likely in the setting of poor nutrition and  hypoalbuminemia  Severe Protein calorie malnutrition: Likely ongoing issues for some time.  Continue dietitian follow-up and augment nutrition with supplementation.  Continue multivitamins as ordered. Monitor magnesium phosphorus and labs daily to watch for refeeding syndrome. Notified by RN about loose stool today. Imodium prn.  Patient has no significant abdominal tenderness, no leukocytosis or fever.  Generalized weakness/incontinence: Nonfocal on exam, extensive work-up with MRI T, L-spine CT head CT C-spine unremarkable.  Likely in the setting of hyperglycemia/failure to thrive  CKD stage III: Creatinine appears more or less stable stable, downtrending. monitor Recent Labs  Lab 04/30/19 0554 05/01/19 0804 05/02/19 0334 05/03/19 0455 05/04/19 0241  BUN 12 12 13 16 18   CREATININE 1.54* 1.28* 1.22* 1.12* 1.18*   S/P gastric bypass history.  Since she was not following up closely.  Monitor thiamine, zinc, copper, vitamins D,E,K,A.  Dietitian on board  Bipolar disorder/anxiety Seroquel and Xanax at home.  Given her lethargy cut down Xanax  To 0.25 mg from 1 mg and Seroquel to 50 from 100 mg.  History of chronic pain on oxycodone-patient asking to have more medication, continue at 7.5 oxycodone cautiously hold for sedation.  Discussed about need to cut back on her pain regimen given her weight loss and her presentation.  Poor IV access: For PICC line placement today.  Discussed risk benefits alternatives. Body mass index is 24.58 kg/m.   DVT prophylaxis:lovenox Code Status: full code Family Communication: plan of care discussed with patient at bedside.  I have discussed the case with patient's fianc over the phone.  Disposition Plan: Remains inpatient due to persistent hypoglycemia and further work-up for her dysphagia, G-tube placement  To  agument nutrition for severe protein calorie malnutrition and monitor for refeeding syndrome inpatient.  Consultants:GI.  surgery Procedures:  1/7-upper GI series confirms very small gastric remnant which immediately empties into the small intestine. Additionally esophageal dysmotility present with slow esophageal clearance, no stricture.  EGD 1. Status post Roux-en-Y gastric bypass surgery. No stricture or stenosis 2. Tiny gastric remnant. I wonder if gastric volume is so small as to explain her issues with eating difficulties, weight loss, and metabolic abnormalities. Impression: 1. Soft diet 2. Upper GI series to evaluate gastric remnant size 3. Ask bariatric surgeons to weigh in on her current anatomy to see if she would benefit from reversal of her surgery. This could likely be done endoscopically. 4. We will follow  MRI ABD/MRCP 1. The gallbladder is distended and sludge filled. No discrete calculi are appreciated. The common bile duct measures up to 7 mm in caliber distally to the ampulla, and there no significant intrahepatic biliary ductal dilatation. There is no calculus or other obstructing lesion appreciated within the biliary tract. 2. There is no gallbladder wall thickening or pericholecystic fluid to suggest acute cholecystitis. 3. Severe hepatic steatosis. 4. Moderate bilateral hydronephrosis and hydroureter of the included portions of the ureters with distended urinary bladder seen in the included lower abdomen. Correlate for urinary retention. 5. Pleural effusions, trace ascites, and anasarca.  Mild thoracic spine without contrast: Normal appearing thoracic spine and spinal cord. Negative for stenosis. Small bilateral pleural effusions  MRI lumbar spine without contrast:  Mild lower lumbar spondylosis without central canal or foraminal narrowing. No finding to explain the patient's symptoms  CT chest abdomen pelvis with contrast:Trace left-sided pleural effusion.  No acute airspace disease. 2. Punctate nodules at the apices. No follow-up needed if patient is low-risk (and has no known  or suspected primary neoplasm). Non-contrast chest CT can be considered in 12 months if patient is high-risk  TTE:. Left ventricular ejection fraction, by visual estimation, is 50 to 55%. The left ventricle has normal function. There is no left ventricular hypertrophy.  2. The left ventricle has no regional wall motion abnormalities.  3. Global right ventricle has normal systolic function.The right ventricular size is normal. No increase in right ventricular wall thickness.  4. Left atrial size was normal.  5. Right atrial size was normal.  6. The mitral valve is normal in structure. No evidence of mitral valve regurgitation.  7. The tricuspid valve is normal in structure.  8. The aortic valve is normal in structure. Aortic valve regurgitation is not visualized.  9. The pulmonic valve was normal in structure. Pulmonic valve regurgitation is not visualized. 10. Normal pulmonary artery systolic pressure. 11. The atrial septum is grossly normal  RUQ [place]: Gallbladder distended with sludge present. No gallstones seen; small gallstones could be obscured by sludge. No gallbladder wall thickening or pericholecystic fluid. 2. Biliary duct dilatation. No mass or calculus is appreciable on this study in the biliary ductal system. From an imaging standpoint, MRCP would be the optimum imaging study of choice to further evaluate the biliary ductal system. 3. Complex cystic lesion in the left lobe of the liver measuring 1.6 x 1.4 x 1.0 cm. Underlying hepatic steatosis. 4.  Slight ascites. 5. Prominence of renal pyramids, a finding of uncertain significance  Microbiology:  Antimicrobials: Anti-infectives (From admission, onward)   Start     Dose/Rate Route Frequency Ordered Stop   05/04/19 1015  ceFAZolin (ANCEF) IVPB 2g/100 mL premix     2 g 200 mL/hr  over 30 Minutes Intravenous To Short Stay 05/04/19 0958 05/05/19 1015   05/01/19 0300  vancomycin (VANCOREADY) IVPB 500 mg/100 mL  Status:  Discontinued     500 mg 100  mL/hr over 60 Minutes Intravenous Every 24 hours 04/30/19 0833 05/02/19 1348   04/30/19 2200  vancomycin (VANCOREADY) IVPB 500 mg/100 mL  Status:  Discontinued     500 mg 100 mL/hr over 60 Minutes Intravenous Every 24 hours 04/30/19 0103 04/30/19 0833   04/30/19 1000  ceFEPIme (MAXIPIME) 2 g in sodium chloride 0.9 % 100 mL IVPB     2 g 200 mL/hr over 30 Minutes Intravenous Every 12 hours 04/30/19 0103     04/29/19 2300  vancomycin (VANCOCIN) IVPB 1000 mg/200 mL premix     1,000 mg 200 mL/hr over 60 Minutes Intravenous  Once 04/29/19 2247 04/30/19 0447   04/29/19 2300  ceFEPIme (MAXIPIME) 2 g in sodium chloride 0.9 % 100 mL IVPB     2 g 200 mL/hr over 30 Minutes Intravenous  Once 04/29/19 2247 04/30/19 0020       Objective: Vitals:   05/04/19 1346 05/04/19 1401 05/04/19 1417 05/04/19 1432  BP: 112/82 126/83 131/87 120/83  Pulse: 87 86 81 77  Resp: 12 14 12  (!) 22  Temp: (!) 97.2 F (36.2 C)   (!) 97 F (36.1 C)  TempSrc:      SpO2: 100% 100% 100% 100%  Weight:      Height:        Intake/Output Summary (Last 24 hours) at 05/04/2019 1543 Last data filed at 05/04/2019 1347 Gross per 24 hour  Intake 1964.51 ml  Output 300 ml  Net 1664.51 ml   Filed Weights   05/02/19 0500 05/02/19 1031 05/03/19 0406  Weight: 54.7 kg 54.7 kg 57.1 kg   Weight change:   Body mass index is 24.58 kg/m.  Intake/Output from previous day: 01/07 0701 - 01/08 0700 In: 1614.7 [P.O.:420; I.V.:783.3; IV Piggyback:411.3] Out: 250 [Urine:250] Intake/Output this shift: Total I/O In: 1150 [I.V.:900; IV Piggyback:250] Out: 105 [Blood:50]  Examination:  General exam: Ill-appearing older than stated age alert awake, on room air.  Not in distress.  HEENT:Oral mucosa moist, Ear/Nose WNL grossly, dentition normal. Respiratory system: Bilaterally clear breath sounds, no wheezing or crackles  Cardiovascular system: S1 & S2 +,No JVD. Gastrointestinal system: Abdomen soft, mildly distended with ascites  NT,,BS+ Nervous System:Alert, awake, moving extremities and grossly nonfocal Extremities: Bilateral lower extremity edema +,distal peripheral pulses palpable.  Skin: No rashes,no icterus. MSK: thin/wasted muscle bulk,tone, power.  Temporal wasting noted.  Medications:  Scheduled Meds: . feeding supplement  1 Container Oral TID BM  . feeding supplement (PRO-STAT SUGAR FREE 64)  30 mL Oral BID  . folic acid  1 mg Intravenous Daily  . HYDROmorphone      . influenza vac split quadrivalent PF  0.5 mL Intramuscular Tomorrow-1000  . ketorolac      . OLANZapine  2.5 mg Oral Daily  . oxyCODONE  7.5 mg Oral Q6H  . pantoprazole  40 mg Oral QHS  . QUEtiapine  50 mg Oral QHS  . thiamine injection  100 mg Intravenous Daily   Continuous Infusions: .  ceFAZolin (ANCEF) IV    . ceFEPime (MAXIPIME) IV 2 g (05/04/19 1530)  . dextrose 75 mL/hr at 05/04/19 1529  . lactated ringers Stopped (05/02/19 1216)  . lactated ringers 500 mL/hr at 05/04/19 1347    Data Reviewed: I have personally reviewed following labs and imaging  studies  CBC: Recent Labs  Lab 04/29/19 1546 04/29/19 2359 04/30/19 0554 05/01/19 0949 05/02/19 0334 05/03/19 0455 05/04/19 0241  WBC 2.0*  2.0* 3.2* 5.9 1.8* 5.0 4.4 6.7  NEUTROABS 1.3* 2.4 4.1 1.5*  --   --   --   HGB 7.4*  7.4* 6.4* 9.0* 10.0* 8.8* 8.0* 7.9*  HCT 24.7*  24.6* 19.7* 28.3* 30.0* 26.1* 23.7* 23.8*  MCV 116.0*  116.0* 107.1* 105.2* 99.7 99.6 100.0 100.4*  PLT 186  175 210 179 167 165 136* 123XX123*   Basic Metabolic Panel: Recent Labs  Lab 04/29/19 1650 04/29/19 1716 04/30/19 0554 05/01/19 0804 05/02/19 0334 05/03/19 0455 05/04/19 0241  NA  --   --  138 137 140 140 137  K  --   --  3.8 4.2 4.1 3.3* 3.3*  CL  --    < > 112* 111 117* 117* 116*  CO2  --    < > 13* 16* 14* 15* 14*  GLUCOSE  --    < > 122* 237* 89 283* 110*  BUN  --    < > 12 12 13 16 18   CREATININE  --    < > 1.54* 1.28* 1.22* 1.12* 1.18*  CALCIUM  --    < > 7.7* 7.8* 8.2* 7.9*  7.7*  MG 1.8  --   --  1.5* 2.1 2.0 1.7  PHOS 4.3  --   --   --  4.0 3.0 2.3*   < > = values in this interval not displayed.   GFR: Estimated Creatinine Clearance: 44.1 mL/min (A) (by C-G formula based on SCr of 1.18 mg/dL (H)). Liver Function Tests: Recent Labs  Lab 04/30/19 0554 05/01/19 0804 05/02/19 0334 05/03/19 0455 05/04/19 0241  AST 39 31 29 23 20   ALT 26 29 28 25 23   ALKPHOS 162* 165* 138* 109 113  BILITOT 1.2 0.8 0.9 1.0 0.7  PROT 4.8* 5.0* 4.9* 4.4* 4.2*  ALBUMIN 2.0* 2.1* 1.9* 1.7* 1.6*   Recent Labs  Lab 04/29/19 1650  LIPASE 13   Recent Labs  Lab 04/29/19 1650  AMMONIA 38*   Coagulation Profile: Recent Labs  Lab 05/01/19 0949  INR 1.4*   Cardiac Enzymes: Recent Labs  Lab 04/29/19 2359  CKTOTAL 120   BNP (last 3 results) No results for input(s): PROBNP in the last 8760 hours. HbA1C: No results for input(s): HGBA1C in the last 72 hours. CBG: Recent Labs  Lab 05/04/19 1002 05/04/19 1100 05/04/19 1157 05/04/19 1428 05/04/19 1536  GLUCAP 71 63* 240* 95 118*   Lipid Profile: No results for input(s): CHOL, HDL, LDLCALC, TRIG, CHOLHDL, LDLDIRECT in the last 72 hours. Thyroid Function Tests: No results for input(s): TSH, T4TOTAL, FREET4, T3FREE, THYROIDAB in the last 72 hours. Anemia Panel: Recent Labs    05/03/19 1221  VITAMINB12 >7,500*   Sepsis Labs: Recent Labs  Lab 04/29/19 1650 05/02/19 0334 05/03/19 0455  PROCALCITON 7.56 1.87 0.99  LATICACIDVEN 1.0  --   --     Recent Results (from the past 240 hour(s))  Blood culture (routine x 2)     Status: None   Collection Time: 04/29/19  4:20 PM   Specimen: Right Antecubital; Blood  Result Value Ref Range Status   Specimen Description RIGHT ANTECUBITAL  Final   Special Requests   Final    BOTTLES DRAWN AEROBIC AND ANAEROBIC Blood Culture adequate volume   Culture   Final    NO GROWTH 5 DAYS Performed at [facility]  [facility], [place] [address] [place], [place] [address]    Report  Status 05/04/2019 FINAL  Final  Culture, Urine     Status: None   Collection Time: 04/29/19  5:15 PM   Specimen: Urine, Random  Result Value Ref Range Status   Specimen Description URINE, RANDOM  Final   Special Requests NONE  Final   Culture   Final    NO GROWTH Performed at [facility], [place] [address], [place], [place] [address]    Report Status 05/01/2019 FINAL  Final  SARS CORONAVIRUS 2 (TAT 6-24 HRS) Nasopharyngeal Nasopharyngeal Swab     Status: None   Collection Time: 04/29/19  8:23 PM   Specimen: Nasopharyngeal Swab  Result Value Ref Range Status   SARS Coronavirus 2 NEGATIVE NEGATIVE Final    Comment: (NOTE) SARS-CoV-2 target nucleic acids are NOT DETECTED. The SARS-CoV-2 RNA is generally detectable in upper and lower respiratory specimens during the acute phase of infection. Negative results do not preclude SARS-CoV-2 infection, do not rule out co-infections with other pathogens, and should not be used as the sole basis for treatment or other patient management decisions. Negative results must be combined with clinical observations, patient history, and epidemiological information. The expected result is Negative. Fact Sheet for Patients: SugarRoll.be Fact Sheet for Healthcare Providers: https://www.woods-mathews.com/ This test is not yet approved or cleared by the [place] FDA and  has been authorized for detection and/or diagnosis of SARS-CoV-2 by FDA under an Emergency Use Authorization (EUA). This EUA will remain  in effect (meaning this test can be used) for the duration of the COVID-19 declaration under Section 56 4(b)(1) of the Act, 21 U.S.C. section 360bbb-3(b)(1), unless the authorization is terminated or revoked sooner. Performed at [facility], [place] [address] [place], [place] [address]   Blood culture (routine x 2)     Status: None (Preliminary result)   Collection Time: 04/29/19 11:09 PM    Specimen: BLOOD  Result Value Ref Range Status   Specimen Description BLOOD LEFT ANTECUBITAL  Final   Special Requests   Final    BOTTLES DRAWN AEROBIC AND ANAEROBIC Blood Culture adequate volume   Culture   Final    NO GROWTH 4 DAYS Performed at [facility], [place] [address] [place], [place] [address]    Report Status PENDING  Incomplete  MRSA PCR Screening     Status: None   Collection Time: 04/30/19  5:31 PM   Specimen: Nasopharyngeal  Result Value Ref Range Status   MRSA by PCR NEGATIVE NEGATIVE Final    Comment:        The GeneXpert MRSA Assay (FDA approved for NASAL specimens only), is one component of a comprehensive MRSA colonization surveillance program. It is not intended to diagnose MRSA infection nor to guide or monitor treatment for MRSA infections. Performed at [facility], [place] [address], [place], [place] [address]       Radiology Studies: US RENAL  Result Date: 05/03/2019 CLINICAL DATA:  54 year old female with questionable hydronephrosis. Prominent renal pyramids on recent ultrasound. EXAM: RENAL / URINARY TRACT ULTRASOUND COMPLETE COMPARISON:  CT Chest, Abdomen, and Pelvis 04/30/2019, and abdomen ultrasound 04/30/2019. FINDINGS: Right Kidney: Renal measurements: 9.2 x 4.6 x 5.3 centimeters = volume: 117 mL. Conspicuous corticomedullary differentiation again noted, this might indicate hyperechogenicity of the renal cortex, which is isoechoic to the liver. No hydronephrosis or right renal mass. Left Kidney: Renal measurements: 8.7 x 4.7 x  4.8 centimeters = volume: 103 mL. Similar exaggerated corticomedullary differentiation to that on the right. No left hydronephrosis or left renal mass. Bladder: Appears normal for degree of bladder distention. Other: None. IMPRESSION: 1. No hydronephrosis or acute renal findings. 2. Exaggerated corticomedullary differentiation/renal pyramids again noted, perhaps related to increased cortical echogenicity from chronic  medical renal disease. Electronically Signed   By: Genevie Ann M.D.   On: 05/03/2019 15:40   DG UGI W SINGLE CM (SOL OR THIN BA)  Result Date: 05/03/2019 CLINICAL DATA:  Remote Roux-en-Y gastric bypass. Nausea and vomiting. Evaluate pouch and emptying. EXAM: UPPER GI SERIES WITH KUB TECHNIQUE: After obtaining a scout radiograph a routine upper GI series was performed using thin barium FLUOROSCOPY TIME:  Fluoroscopy Time:  2 minutes 24 seconds Radiation Exposure Index (if provided by the fluoroscopic device): Number of Acquired Spot Images: 0 COMPARISON:  CT 04/30/2019 FINDINGS: Fluoroscopic evaluation of swallowing demonstrates poor esophageal peristalsis with full column stasis and slow emptying of the esophagus. No fixed stricture. There is a very small gastric pouch with immediate emptying of the pouch in to small bowel. Small bowel is normal caliber. IMPRESSION: Very small pouch with immediate emptying into small bowel. Slow clearance of the esophagus with esophageal dysmotility. No fixed stricture. Electronically Signed   By: Rolm Baptise M.D.   On: 05/03/2019 09:22      LOS: 4 days   Time spent: More than 50% of that time was spent in counseling and/or coordination of care.  Antonieta Pert, MD [facility]  05/04/2019, 3:43 PM

## 2019-05-04 NOTE — Anesthesia Postprocedure Evaluation (Signed)
Anesthesia Post Note  Patient: Angie [name]  Procedure(s) Performed: LAPAROSCOPIC INSERTION GASTROSTOMY TUBE (Left Abdomen)     Patient location during evaluation: PACU Anesthesia Type: General Level of consciousness: awake and alert Pain management: pain level controlled Vital Signs Assessment: post-procedure vital signs reviewed and stable Respiratory status: spontaneous breathing, nonlabored ventilation, respiratory function stable and patient connected to nasal cannula oxygen Cardiovascular status: blood pressure returned to baseline and stable Postop Assessment: no apparent nausea or vomiting Anesthetic complications: no    Last Vitals:  Vitals:   05/04/19 1417 05/04/19 1432  BP: 131/87 120/83  Pulse: 81 77  Resp: 12 (!) 22  Temp:  (!) 36.1 C  SpO2: 100% 100%    Last Pain:  Vitals:   05/04/19 1417  TempSrc:   PainSc: 5                  Barnet Glasgow

## 2019-05-04 NOTE — Progress Notes (Addendum)
Surgery is planning to take the patient for laparoscopic gastric feeding tube placement today.   Azucena Freed PA-C [place] Agree with feeding gastrostomy tube 2. Should have formal nutritional consultation regarding tube feedings moving forward. 3. Should be monitored as an outpatient by her bariatric surgeon 4. If she responds to gastrostomy tube feedings in terms of her malnutrition other metabolic disturbances, then some form of reversal of her gastric bypass surgery could be considered versus chronic feeding via gastrostomy tube. I'm available for any questions in this regard. We'll sign off.  Docia Chuck. Geri [place]., M.D. [facility] Division of Gastroenterology

## 2019-05-05 LAB — GLUCOSE, CAPILLARY
Glucose-Capillary: 125 mg/dL — ABNORMAL HIGH (ref 70–99)
Glucose-Capillary: 131 mg/dL — ABNORMAL HIGH (ref 70–99)
Glucose-Capillary: 149 mg/dL — ABNORMAL HIGH (ref 70–99)
Glucose-Capillary: 154 mg/dL — ABNORMAL HIGH (ref 70–99)
Glucose-Capillary: 160 mg/dL — ABNORMAL HIGH (ref 70–99)
Glucose-Capillary: 177 mg/dL — ABNORMAL HIGH (ref 70–99)
Glucose-Capillary: 210 mg/dL — ABNORMAL HIGH (ref 70–99)
Glucose-Capillary: 214 mg/dL — ABNORMAL HIGH (ref 70–99)
Glucose-Capillary: 231 mg/dL — ABNORMAL HIGH (ref 70–99)
Glucose-Capillary: 88 mg/dL (ref 70–99)
Glucose-Capillary: 99 mg/dL (ref 70–99)

## 2019-05-05 LAB — MAGNESIUM: Magnesium: 1.6 mg/dL — ABNORMAL LOW (ref 1.7–2.4)

## 2019-05-05 LAB — CBC
HCT: 23.4 % — ABNORMAL LOW (ref 36.0–46.0)
Hemoglobin: 7.8 g/dL — ABNORMAL LOW (ref 12.0–15.0)
MCH: 33.8 pg (ref 26.0–34.0)
MCHC: 33.3 g/dL (ref 30.0–36.0)
MCV: 101.3 fL — ABNORMAL HIGH (ref 80.0–100.0)
Platelets: 98 10*3/uL — ABNORMAL LOW (ref 150–400)
RBC: 2.31 MIL/uL — ABNORMAL LOW (ref 3.87–5.11)
RDW: 18.5 % — ABNORMAL HIGH (ref 11.5–15.5)
WBC: 9.1 10*3/uL (ref 4.0–10.5)
nRBC: 0 % (ref 0.0–0.2)

## 2019-05-05 LAB — CULTURE, BLOOD (ROUTINE X 2)
Culture: NO GROWTH
Special Requests: ADEQUATE

## 2019-05-05 LAB — PHOSPHORUS: Phosphorus: 2.3 mg/dL — ABNORMAL LOW (ref 2.5–4.6)

## 2019-05-05 LAB — PROINSULIN/INSULIN RATIO
Insulin: 1.1 u[IU]/mL
Proinsulin/Insulin Ratio: 163 %
Proinsulin: 12 pmol/L

## 2019-05-05 MED ORDER — OXYCODONE HCL 5 MG PO TABS
10.0000 mg | ORAL_TABLET | Freq: Two times a day (BID) | ORAL | Status: DC
  Administered 2019-05-05 – 2019-05-11 (×13): 10 mg via ORAL
  Filled 2019-05-05 (×13): qty 2

## 2019-05-05 MED ORDER — MAGNESIUM SULFATE 2 GM/50ML IV SOLN
2.0000 g | Freq: Once | INTRAVENOUS | Status: AC
  Administered 2019-05-05: 2 g via INTRAVENOUS
  Filled 2019-05-05: qty 50

## 2019-05-05 MED ORDER — K PHOS MONO-SOD PHOS DI & MONO 155-852-130 MG PO TABS
500.0000 mg | ORAL_TABLET | Freq: Three times a day (TID) | ORAL | Status: AC
  Administered 2019-05-05 – 2019-05-07 (×6): 500 mg via ORAL
  Filled 2019-05-05 (×7): qty 2

## 2019-05-05 MED ORDER — VITAMIN D 25 MCG (1000 UNIT) PO TABS
3000.0000 [IU] | ORAL_TABLET | Freq: Every day | ORAL | Status: DC
  Administered 2019-05-06 – 2019-05-11 (×6): 3000 [IU]
  Filled 2019-05-05 (×7): qty 3

## 2019-05-05 MED ORDER — OXYCODONE HCL 5 MG PO TABS
7.5000 mg | ORAL_TABLET | Freq: Two times a day (BID) | ORAL | Status: DC | PRN
  Administered 2019-05-06 – 2019-05-11 (×9): 7.5 mg via ORAL
  Filled 2019-05-05 (×10): qty 2

## 2019-05-05 NOTE — Progress Notes (Addendum)
[facility]/Trauma Progress Note  1 Day Post-Op   Assessment/Plan DM CKD HTN GERD Hx seizures  Chronic anemia  Hypoglycemia - resolved  FTT  Protein Calorie Malnutrition (pre-alb 9.9) Dysphagia and Postprandial emesis  - Patient with Hx of Roux-en-Y in 2007. 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 She ultimately underwent a laparoscopic revision of gastrojejunostomy and insertion of gastrostomy tube to remnant stomach in 2019 by Dr. Kieth Brightly. G-tube has since been removed.  -  EGD on 1/6 that showed no stricture or stenosis and a small gastric remnant.  - Upper GI 1/7 showed slow clearance of the esophagus with esophageal dysmotility, no stricture.  This also showed small gastric pouch with immediate emptying into the small bowel.   - IR did note feel patients anatomy was amenable to percutaneous gastrostomy tube placement  - s/p laparoscopic gastrostomy tube insertion, Dr. Quinn Axe, 01/08 - Tube feeds   FEN - tube feeds and carb modified VTE - SCDs ID -  no antibiotics currently, afebrile, WBC 9.1 Follow up: Dr. Kieth Brightly  Plan: continue tube feeds per Dr. Felizardo Hoffmann orders. Will need [place] set up for home tube feedings. PT recs pending.     LOS: 5 days    Subjective: CC: abdominal soreness  No vomiting overnight. Tolerating tube feeds. Pt was eating small amounts prior to admission. Nurse at bedside.   Objective: Vital signs in last 24 hours: Temp:  [97 F (36.1 C)-98.3 F (36.8 C)] 98.2 F (36.8 C) (01/09 0803) Pulse Rate:  [77-97] 97 (01/09 0803) Resp:  [12-22] 16 (01/09 0803) BP: (107-135)/(82-102) 131/93 (01/09 0803) SpO2:  [100 %] 100 % (01/09 0803) Last BM Date: 05/03/19  Intake/Output from previous day: 01/08 0701 - 01/09 0700 In: 2276.7 [I.V.:1576.7; NG/GT:200; IV  Piggyback:250] Out: 50 [Blood:50] Intake/Output this shift: No intake/output data recorded.  PE:  Gen:  Alert, NAD, pleasant, cooperative Pulm:  Rate and effort normal Abd: Soft, ND, port sites with glue intact are well appearing and are without drainage or signs of infection. G tube site with small amt of dried blood on dressing and site is clean. Generalized TTP without guarding. No peritonitis.  Skin: no rashes noted, warm and dry   Anti-infectives: Anti-infectives (From admission, onward)   Start     Dose/Rate Route Frequency Ordered Stop   05/04/19 1015  ceFAZolin (ANCEF) IVPB 2g/100 mL premix     2 g 200 mL/hr over 30 Minutes Intravenous To Short Stay 05/04/19 0958 05/05/19 1015   05/01/19 0300  vancomycin (VANCOREADY) IVPB 500 mg/100 mL  Status:  Discontinued     500 mg 100 mL/hr over 60 Minutes Intravenous Every 24 hours 04/30/19 0833 05/02/19 1348   04/30/19 2200  vancomycin (VANCOREADY) IVPB 500 mg/100 mL  Status:  Discontinued     500 mg 100 mL/hr over 60 Minutes Intravenous Every 24 hours 04/30/19 0103 04/30/19 0833   04/30/19 1000  ceFEPIme (MAXIPIME) 2 g in sodium chloride 0.9 % 100 mL IVPB  Status:  Discontinued     2 g 200 mL/hr over 30 Minutes Intravenous Every 12 hours 04/30/19 0103 05/04/19 1552   04/29/19 2300  vancomycin (VANCOCIN) IVPB 1000 mg/200 mL premix     1,000 mg 200 mL/hr over 60 Minutes Intravenous  Once 04/29/19 2247 04/30/19 0447   04/29/19 2300  ceFEPIme (MAXIPIME) 2 g in sodium chloride 0.9 %  100 mL IVPB     2 g 200 mL/hr over 30 Minutes Intravenous  Once 04/29/19 2247 04/30/19 0020      Lab Results:  Recent Labs    05/04/19 0241 05/05/19 0438  WBC 6.7 9.1  HGB 7.9* 7.8*  HCT 23.8* 23.4*  PLT 122* 98*   BMET Recent Labs    05/03/19 0455 05/04/19 0241  NA 140 137  K 3.3* 3.3*  CL 117* 116*  CO2 15* 14*  GLUCOSE 283* 110*  BUN 16 18  CREATININE 1.12* 1.18*  CALCIUM 7.9* 7.7*   PT/INR No results for input(s): LABPROT, INR in  the last 72 hours. CMP     Component Value Date/Time   NA 137 05/04/2019 0241   K 3.3 (L) 05/04/2019 0241   CL 116 (H) 05/04/2019 0241   CO2 14 (L) 05/04/2019 0241   GLUCOSE 110 (H) 05/04/2019 0241   BUN 18 05/04/2019 0241   CREATININE 1.18 (H) 05/04/2019 0241   CALCIUM 7.7 (L) 05/04/2019 0241   PROT 4.2 (L) 05/04/2019 0241   ALBUMIN 1.6 (L) 05/04/2019 0241   AST 20 05/04/2019 0241   ALT 23 05/04/2019 0241   ALKPHOS 113 05/04/2019 0241   BILITOT 0.7 05/04/2019 0241   GFRNONAA 53 (L) 05/04/2019 0241   GFRAA >60 05/04/2019 0241   Lipase     Component Value Date/Time   LIPASE 13 04/29/2019 1650    Studies/Results: US RENAL  Result Date: 05/03/2019 CLINICAL DATA:  54 year old female with questionable hydronephrosis. Prominent renal pyramids on recent ultrasound. EXAM: RENAL / URINARY TRACT ULTRASOUND COMPLETE COMPARISON:  CT Chest, Abdomen, and Pelvis 04/30/2019, and abdomen ultrasound 04/30/2019. FINDINGS: Right Kidney: Renal measurements: 9.2 x 4.6 x 5.3 centimeters = volume: 117 mL. Conspicuous corticomedullary differentiation again noted, this might indicate hyperechogenicity of the renal cortex, which is isoechoic to the liver. No hydronephrosis or right renal mass. Left Kidney: Renal measurements: 8.7 x 4.7 x 4.8 centimeters = volume: 103 mL. Similar exaggerated corticomedullary differentiation to that on the right. No left hydronephrosis or left renal mass. Bladder: Appears normal for degree of bladder distention. Other: None. IMPRESSION: 1. No hydronephrosis or acute renal findings. 2. Exaggerated corticomedullary differentiation/renal pyramids again noted, perhaps related to increased cortical echogenicity from chronic medical renal disease. Electronically Signed   By: Genevie Ann M.D.   On: 05/03/2019 15:40     Kalman Drape, [facility] Please see amion for pager for the following: Cristine Polio, & Friday 7:00am - 4:30pm Thursdays 7:00am -11:30am

## 2019-05-05 NOTE — Progress Notes (Signed)
PROGRESS NOTE    Angie [name]  [id number] DOB: 1966-02-10 DOA: 04/29/2019 PCP: Harvie Junior, MD   Brief Narrative: 54 year old female with history of gastric bypass surgery prior to which she had a history of diabetes mellitus CKD stage III, chronic anemia, chronic malnutrition with history of esophageal strictures, bipolar disorder and chronic pain, chronic anemia was found unresponsive by patient's boyfriend when he returned back from work after 12-hour shift.  Patient is lying in recliner and was found to be in the 70s.  EMS was called and found her sugar was 23 was given glucagon and D50 and brought to the ER.  To admitting physician patient stated "that she has been feeling increasingly weak over the last 3 weeks with increasing peripheral edema of both lower extremity and difficulty ambulate with patient planning to see a neurologist because of the difficulty ambulating at no incontinence of urine or bowel or any decrease sensation of the extremities.  Patient did have a fall about a week ago when she tripped on a vanity in which fell on her." On phone per her fiancee since march of last year she has been losing weight, he thinks her esophagus is closing up again. Had appointment with her surgeon this week. Has good appetite but not abel to swallow. She is very fatigued easily with minimal activity. Only eating soft food and soup. Only little bit of regular food. She started to have leg swelling recently.  In ER: became more alert awake but was hypothermic with temperature of 93 F.  Patient became again hypoglycemic and had to be started on D10.  Blood cultures procalcitonin levels cortisol level TSH was sent.  Patient had CT head and C-spine followed by CT chest and abdomen.  CT head and C-spine were unremarkable.  CT chest and abdomen was showing third spacing of fluid and hepatic steatosis.  Pulmonary nodule also seen.  Covid test negative.  Labs show creatinine 1.5 albumin of 2.4  hemoglobin 7.4 which further decreased to 6.4 WBC was 2.  Patient was started on D10 and admitted for further management of hypothermia hypoglycemia with elevated procalcitonin concerning for infection for which antibiotics were started Patient was admitted-with empiric antibiotics IV fluids and dextrose infusion. Mental status has been lethargic.Patient on chronic Xanax, high-dose oxycodone and Seroquel and dose was cut down with improvement in her mental status. Continues to have poor oral intake, 1/5-GI consulted 1/6-status post EGD 1/8-laproscopic gastrostomy tube insertion by Dr. Kieth Brightly  Subjective:  Patient asking to increase her oxycodone to 10 mg twice daily. No acute events.  Blood sugar is improving dextrose is being weaned down. Afebrile no leukocytosis  Assessment & Plan:   Postprandial vomiting dysphagia failure to thrive weight loss: Is post EGD 1/6: Roux-en-Y gastric bypass anatomy, no stricture, no stenosis.  Tiny gastric remnant suspect gastric remnant too small to allow for proper nutritional absorption and is causing her difficulties with metabolic abnormalities, weight loss, feeding problems.1/7-upper GI series confirms very small gastric remnant which immediately empties into the small intestine. Additionally esophageal dysmotility present with slow esophageal clearance, no stricture.Appreciate GI input.  Seen by surgery s/p laproscopic gastrostomy tube insertion by Dr. Kieth Brightly 1/8.  Feeding started continue as per nutrition monitor for refeeding syndrome   Refractory hypoglycemia: Due to #1.  Improving, wean off steroids as feeding improves with gastrostomy tube. Random cortisol on admission was 25.7 ruled out adrenalinsufficiency ( also underwent cosyntropin test).  C-peptide level normal, random insulin level low, proinsulin/insulin ratio  IGF low at 35 Is low. Recent Labs  Lab 05/05/19 0217 05/05/19 0305 05/05/19 0501 05/05/19 0557 05/05/19 0803  GLUCAP 177* 149*  214* 231* 210*   Hyponatremia:Resolved. Hypomagnesemia/hypophosphatemia: replete and recheck.  Mag sulfate 2 g x 1 Neutra-Phos 3 times daily Hypokalemia: Being repleted. Metabolic acidosis with bicarb 10-->13-15  Hypothermia: Due to severe protein calorie malnutrition and hypoglycemia.  Improved.Pro Cal was elevated on admission 7.5.  -repeat-procalcitonin 0.99,Lactic acid normal. Initially on  empiric vancomycin and cefepime due to concern for sepsis. COVID-19 negative blood culture 1/3  Neg so far. UA on admissions negative and CT chest and abdomen no significant finding.  MRCP-no evidence of cholecystitis no CBD dilatation gallbladder with sludge and distended.  Vancomycin discontinued 1/6 .  At this time do not suspect infection or sepsis- off cefepime 1/8  Severe hepatic steatosis:Noted in MRCP.  CBD 7 mm without intrahepatic biliary dilation. unclear etiology question due to malnutrition.    Distended gallbladder with sludge- monitor lfts. No RUQ tenderness.  Moderate bilateral hydronephrosis and hydroureter cont as needed  with bladder scan.unremarkable renal ultrasound.  Macrocytic anemia/folate deficiency:Vitamin B12 high(level very high likely after injection), ferritin 247, iron 59, folate low at less than 4.7-started on iv folate/thiamine supplementation. Fecal occult blood was negative.  Patient received 1 unit PRBC for hemoglobin of 6.4 g. Monitor. Recent Labs  Lab 05/01/19 0949 05/02/19 0334 05/03/19 0455 05/04/19 0241 05/05/19 0438  HGB 10.0* 8.8* 8.0* 7.9* 7.8*  HCT 30.0* 26.1* 23.7* 23.8* 23.4*   Leukopenia WBC improved.  Monitor. Recent Labs  Lab 05/01/19 0949 05/02/19 0334 05/03/19 0455 05/04/19 0241 05/05/19 0438  WBC 1.8* 5.0 4.4 6.7 9.1   Bilateral lower extremity edema/anasarca/third spacing: Due to hypoalbuminemia.    Severe Protein calorie malnutrition: Due to small dominant gastric pouch.  Starting tube feeding after gastrostomy tube.  Appreciate  nutrition input.   Generalized weakness/incontinence: Nonfocal on exam, extensive work-up with MRI T, L-spine CT head CT C-spine unremarkable.  Likely in the setting of hyperglycemia/failure to thrive  CKD stage III: Creatinine appears more or less stable stable, downtrending. monitor Recent Labs  Lab 04/30/19 0554 05/01/19 0804 05/02/19 0334 05/03/19 0455 05/04/19 0241  BUN 12 12 13 16 18   CREATININE 1.54* 1.28* 1.22* 1.12* 1.18*   S/P gastric bypass history.  Since she was not following up closely.  Monitor thiamine, zinc, copper, vitamins D,E,K,A.  Dietitian on board  Bipolar disorder/anxiety Seroquel and Xanax at home.  Given her lethargy cut down Xanax  To 0.25 mg from 1 mg and Seroquel to 50 from 100 mg.  History of chronic pain on oxycodone-patient asking to have more medication, continue at 7.5 oxycodone  Prn and wasn't 10 mg oxy bid. aat hoem on 15 mg q6hr which is too much for her given her presentation and we discussed about cutting back on the dose.   Poor IV access: PICC line placement  1/8 Body mass index is 24.58 kg/m.   DVT prophylaxis:lovenox Code Status: full code Family Communication: plan of care discussed with patient at bedside.  I have discussed the case with patient's fianc over the phone.  Disposition Plan: Remains inpatient to initiate tube feeding watch for refeeding syndrome, continue PT OT for disposition.  Patient reports she has used tube feeding at home and is aware.   Discussed with surgery PA who graciously accepted to be attending on the case. Hospitalist team will continue to follow along.  Consultants:GI. Surgery. TRH. Procedures:  1/7-upper GI series confirms  very small gastric remnant which immediately empties into the small intestine. Additionally esophageal dysmotility present with slow esophageal clearance, no stricture.  EGD 1. Status post Roux-en-Y gastric bypass surgery. No stricture or stenosis 2. Tiny gastric remnant. I wonder if  gastric volume is so small as to explain her issues with eating difficulties, weight loss, and metabolic abnormalities. Impression: 1. Soft diet 2. Upper GI series to evaluate gastric remnant size 3. Ask bariatric surgeons to weigh in on her current anatomy to see if she would benefit from reversal of her surgery. This could likely be done endoscopically. 4. We will follow  MRI ABD/MRCP 1. The gallbladder is distended and sludge filled. No discrete calculi are appreciated. The common bile duct measures up to 7 mm in caliber distally to the ampulla, and there no significant intrahepatic biliary ductal dilatation. There is no calculus or other obstructing lesion appreciated within the biliary tract. 2. There is no gallbladder wall thickening or pericholecystic fluid to suggest acute cholecystitis. 3. Severe hepatic steatosis. 4. Moderate bilateral hydronephrosis and hydroureter of the included portions of the ureters with distended urinary bladder seen in the included lower abdomen. Correlate for urinary retention. 5. Pleural effusions, trace ascites, and anasarca.  Mild thoracic spine without contrast: Normal appearing thoracic spine and spinal cord. Negative for stenosis. Small bilateral pleural effusions  MRI lumbar spine without contrast:  Mild lower lumbar spondylosis without central canal or foraminal narrowing. No finding to explain the patient's symptoms  CT chest abdomen pelvis with contrast:Trace left-sided pleural effusion.  No acute airspace disease. 2. Punctate nodules at the apices. No follow-up needed if patient is low-risk (and has no known or suspected primary neoplasm). Non-contrast chest CT can be considered in 12 months if patient is high-risk  TTE:. Left ventricular ejection fraction, by visual estimation, is 50 to 55%. The left ventricle has normal function. There is no left ventricular hypertrophy.  2. The left ventricle has no regional wall motion abnormalities.  3.  Global right ventricle has normal systolic function.The right ventricular size is normal. No increase in right ventricular wall thickness.  4. Left atrial size was normal.  5. Right atrial size was normal.  6. The mitral valve is normal in structure. No evidence of mitral valve regurgitation.  7. The tricuspid valve is normal in structure.  8. The aortic valve is normal in structure. Aortic valve regurgitation is not visualized.  9. The pulmonic valve was normal in structure. Pulmonic valve regurgitation is not visualized. 10. Normal pulmonary artery systolic pressure. 11. The atrial septum is grossly normal  RUQ [place]: Gallbladder distended with sludge present. No gallstones seen; small gallstones could be obscured by sludge. No gallbladder wall thickening or pericholecystic fluid. 2. Biliary duct dilatation. No mass or calculus is appreciable on this study in the biliary ductal system. From an imaging standpoint, MRCP would be the optimum imaging study of choice to further evaluate the biliary ductal system. 3. Complex cystic lesion in the left lobe of the liver measuring 1.6 x 1.4 x 1.0 cm. Underlying hepatic steatosis. 4.  Slight ascites. 5. Prominence of renal pyramids, a finding of uncertain significance  Microbiology:  Antimicrobials: Anti-infectives (From admission, onward)   Start     Dose/Rate Route Frequency Ordered Stop   05/04/19 1015  ceFAZolin (ANCEF) IVPB 2g/100 mL premix     2 g 200 mL/hr over 30 Minutes Intravenous To Short Stay 05/04/19 0958 05/05/19 1015   05/01/19 0300  vancomycin (VANCOREADY) IVPB 500 mg/100 mL  Status:  Discontinued     500 mg 100 mL/hr over 60 Minutes Intravenous Every 24 hours 04/30/19 0833 05/02/19 1348   04/30/19 2200  vancomycin (VANCOREADY) IVPB 500 mg/100 mL  Status:  Discontinued     500 mg 100 mL/hr over 60 Minutes Intravenous Every 24 hours 04/30/19 0103 04/30/19 0833   04/30/19 1000  ceFEPIme (MAXIPIME) 2 g in sodium chloride 0.9 % 100 mL IVPB   Status:  Discontinued     2 g 200 mL/hr over 30 Minutes Intravenous Every 12 hours 04/30/19 0103 05/04/19 1552   04/29/19 2300  vancomycin (VANCOCIN) IVPB 1000 mg/200 mL premix     1,000 mg 200 mL/hr over 60 Minutes Intravenous  Once 04/29/19 2247 04/30/19 0447   04/29/19 2300  ceFEPIme (MAXIPIME) 2 g in sodium chloride 0.9 % 100 mL IVPB     2 g 200 mL/hr over 30 Minutes Intravenous  Once 04/29/19 2247 04/30/19 0020       Objective: Vitals:   05/04/19 1900 05/04/19 2321 05/05/19 0429 05/05/19 0803  BP: (!) 125/102 107/84 (!) 135/97 (!) 131/93  Pulse:  91 90 97  Resp: 18 14 16 16   Temp: 97.6 F (36.4 C) 98.1 F (36.7 C) 98.3 F (36.8 C) 98.2 F (36.8 C)  TempSrc: Axillary Oral Oral Oral  SpO2: 100% 100% 100% 100%  Weight:      Height:        Intake/Output Summary (Last 24 hours) at 05/05/2019 1107 Last data filed at 05/05/2019 [id number] Gross per 24 hour  Intake 2276.67 ml  Output 50 ml  Net 2226.67 ml   Filed Weights   05/02/19 0500 05/02/19 1031 05/03/19 0406  Weight: 54.7 kg 54.7 kg 57.1 kg   Weight change:   Body mass index is 24.58 kg/m.  Intake/Output from previous day: 01/08 0701 - 01/09 0700 In: 2276.7 [I.V.:1576.7; NG/GT:200; IV Piggyback:250] Out: 50 [Blood:50] Intake/Output this shift: No intake/output data recorded.  Examination:  General exam: Ill-appearing older than stated age alert awake, not in distress, on room air.  Pleasant.   HEENT:Oral mucosa moist, Ear/Nose WNL grossly, dentition normal. Respiratory system: Bilaterally clear breath sounds,no wheezing or crackles  Cardiovascular system: S1 & S2 +,No JVD. Gastrointestinal system: Abdomen soft, mildly distended with ascites NT,,BS+ Nervous System:Alert, awake, moving extremities and grossly nonfocal Extremities: Bilateral lower extremity edema + pitting, distal peripheral pulses palpable.  Skin: No rashes,no icterus. MSK: thin/wasted muscle bulk,tone, power.  Temporal wasting  noted.  Medications:  Scheduled Meds: . feeding supplement (JEVITY 1.2 CAL)  60 mL Per Tube Q4H  . feeding supplement (OSMOLITE 1.5 CAL)  80-237 mL Per Tube 5 X Daily  . folic acid  1 mg Intravenous Daily  . free water  40 mL Per Tube 5 X Daily  . influenza vac split quadrivalent PF  0.5 mL Intramuscular Tomorrow-1000  . OLANZapine  2.5 mg Oral Daily  . oxyCODONE  10 mg Oral Q12H  . pantoprazole  40 mg Oral QHS  . phosphorus  500 mg Oral TID WC  . QUEtiapine  50 mg Oral QHS  . thiamine injection  100 mg Intravenous Daily   Continuous Infusions: . lactated ringers Stopped (05/02/19 1216)  . lactated ringers 500 mL/hr at 05/04/19 1347  . magnesium sulfate bolus IVPB      Data Reviewed: I have personally reviewed following labs and imaging studies  CBC: Recent Labs  Lab 04/29/19 1546 04/29/19 2359 04/30/19 [id number] 05/01/19 [id number] 05/02/19 0334 05/03/19 0455 05/04/19 0241  05/05/19 0438  WBC 2.0*  2.0* 3.2* 5.9 1.8* 5.0 4.4 6.7 9.1  NEUTROABS 1.3* 2.4 4.1 1.5*  --   --   --   --   HGB 7.4*  7.4* 6.4* 9.0* 10.0* 8.8* 8.0* 7.9* 7.8*  HCT 24.7*  24.6* 19.7* 28.3* 30.0* 26.1* 23.7* 23.8* 23.4*  MCV 116.0*  116.0* 107.1* 105.2* 99.7 99.6 100.0 100.4* 101.3*  PLT 186  175 210 179 167 165 136* 122* 98*   Basic Metabolic Panel: Recent Labs  Lab 04/29/19 1650 04/29/19 1716 04/30/19 0554 05/01/19 0804 05/02/19 0334 05/03/19 0455 05/04/19 0241 05/05/19 0438  NA  --   --  138 137 140 140 137  --   K  --   --  3.8 4.2 4.1 3.3* 3.3*  --   CL  --    < > 112* 111 117* 117* 116*  --   CO2  --    < > 13* 16* 14* 15* 14*  --   GLUCOSE  --    < > 122* 237* 89 283* 110*  --   BUN  --    < > 12 12 13 16 18   --   CREATININE  --    < > 1.54* 1.28* 1.22* 1.12* 1.18*  --   CALCIUM  --    < > 7.7* 7.8* 8.2* 7.9* 7.7*  --   MG 1.8  --   --  1.5* 2.1 2.0 1.7 1.6*  PHOS 4.3  --   --   --  4.0 3.0 2.3* 2.3*   < > = values in this interval not displayed.   GFR: Estimated Creatinine  Clearance: 44.1 mL/min (A) (by C-G formula based on SCr of 1.18 mg/dL (H)). Liver Function Tests: Recent Labs  Lab 04/30/19 0554 05/01/19 0804 05/02/19 0334 05/03/19 0455 05/04/19 0241  AST 39 31 29 23 20   ALT 26 29 28 25 23   ALKPHOS 162* 165* 138* 109 113  BILITOT 1.2 0.8 0.9 1.0 0.7  PROT 4.8* 5.0* 4.9* 4.4* 4.2*  ALBUMIN 2.0* 2.1* 1.9* 1.7* 1.6*   Recent Labs  Lab 04/29/19 1650  LIPASE 13   Recent Labs  Lab 04/29/19 1650  AMMONIA 38*   Coagulation Profile: Recent Labs  Lab 05/01/19 0949  INR 1.4*   Cardiac Enzymes: Recent Labs  Lab 04/29/19 2359  CKTOTAL 120   BNP (last 3 results) No results for input(s): PROBNP in the last 8760 hours. HbA1C: No results for input(s): HGBA1C in the last 72 hours. CBG: Recent Labs  Lab 05/05/19 0217 05/05/19 0305 05/05/19 0501 05/05/19 0557 05/05/19 0803  GLUCAP 177* 149* 214* 231* 210*   Lipid Profile: No results for input(s): CHOL, HDL, LDLCALC, TRIG, CHOLHDL, LDLDIRECT in the last 72 hours. Thyroid Function Tests: No results for input(s): TSH, T4TOTAL, FREET4, T3FREE, THYROIDAB in the last 72 hours. Anemia Panel: Recent Labs    05/03/19 1221  VITAMINB12 >7,500*   Sepsis Labs: Recent Labs  Lab 04/29/19 1650 05/02/19 0334 05/03/19 0455  PROCALCITON 7.56 1.87 0.99  LATICACIDVEN 1.0  --   --     Recent Results (from the past 240 hour(s))  Blood culture (routine x 2)     Status: None   Collection Time: 04/29/19  4:20 PM   Specimen: Right Antecubital; Blood  Result Value Ref Range Status   Specimen Description RIGHT ANTECUBITAL  Final   Special Requests   Final    BOTTLES DRAWN AEROBIC AND ANAEROBIC Blood Culture adequate  volume   Culture   Final    NO GROWTH 5 DAYS Performed at [facility], [place] [address], [place], [place] [address]    Report Status 05/04/2019 FINAL  Final  Culture, Urine     Status: None   Collection Time: 04/29/19  5:15 PM   Specimen: Urine, Random  Result Value Ref Range  Status   Specimen Description URINE, RANDOM  Final   Special Requests NONE  Final   Culture   Final    NO GROWTH Performed at [facility], [place] [address] [place], [place] [address]    Report Status 05/01/2019 FINAL  Final  SARS CORONAVIRUS 2 (TAT 6-24 HRS) Nasopharyngeal Nasopharyngeal Swab     Status: None   Collection Time: 04/29/19  8:23 PM   Specimen: Nasopharyngeal Swab  Result Value Ref Range Status   SARS Coronavirus 2 NEGATIVE NEGATIVE Final    Comment: (NOTE) SARS-CoV-2 target nucleic acids are NOT DETECTED. The SARS-CoV-2 RNA is generally detectable in upper and lower respiratory specimens during the acute phase of infection. Negative results do not preclude SARS-CoV-2 infection, do not rule out co-infections with other pathogens, and should not be used as the sole basis for treatment or other patient management decisions. Negative results must be combined with clinical observations, patient history, and epidemiological information. The expected result is Negative. Fact Sheet for Patients: SugarRoll.be Fact Sheet for Healthcare Providers: https://www.woods-mathews.com/ This test is not yet approved or cleared by the [place] FDA and  has been authorized for detection and/or diagnosis of SARS-CoV-2 by FDA under an Emergency Use Authorization (EUA). This EUA will remain  in effect (meaning this test can be used) for the duration of the COVID-19 declaration under Section 56 4(b)(1) of the Act, 21 U.S.C. section 360bbb-3(b)(1), unless the authorization is terminated or revoked sooner. Performed at [facility], [place] [address], [place], [place] [address]   Blood culture (routine x 2)     Status: None   Collection Time: 04/29/19 11:09 PM   Specimen: BLOOD  Result Value Ref Range Status   Specimen Description BLOOD LEFT ANTECUBITAL  Final   Special Requests   Final    BOTTLES DRAWN AEROBIC AND ANAEROBIC Blood  Culture adequate volume   Culture   Final    NO GROWTH 5 DAYS Performed at [facility], [place] [address], [place], [place] [address]    Report Status 05/05/2019 FINAL  Final  MRSA PCR Screening     Status: None   Collection Time: 04/30/19  5:31 PM   Specimen: Nasopharyngeal  Result Value Ref Range Status   MRSA by PCR NEGATIVE NEGATIVE Final    Comment:        The GeneXpert MRSA Assay (FDA approved for NASAL specimens only), is one component of a comprehensive MRSA colonization surveillance program. It is not intended to diagnose MRSA infection nor to guide or monitor treatment for MRSA infections. Performed at [facility], [place] [address], [place],  [address]       Radiology Studies: US RENAL  Result Date: 05/03/2019 CLINICAL DATA:  54 year old female with questionable hydronephrosis. Prominent renal pyramids on recent ultrasound. EXAM: RENAL / URINARY TRACT ULTRASOUND COMPLETE COMPARISON:  CT Chest, Abdomen, and Pelvis 04/30/2019, and abdomen ultrasound 04/30/2019. FINDINGS: Right Kidney: Renal measurements: 9.2 x 4.6 x 5.3 centimeters = volume: 117 mL. Conspicuous corticomedullary differentiation again noted, this might indicate hyperechogenicity of the renal cortex, which is isoechoic to  the liver. No hydronephrosis or right renal mass. Left Kidney: Renal measurements: 8.7 x 4.7 x 4.8 centimeters = volume: 103 mL. Similar exaggerated corticomedullary differentiation to that on the right. No left hydronephrosis or left renal mass. Bladder: Appears normal for degree of bladder distention. Other: None. IMPRESSION: 1. No hydronephrosis or acute renal findings. 2. Exaggerated corticomedullary differentiation/renal pyramids again noted, perhaps related to increased cortical echogenicity from chronic medical renal disease. Electronically Signed   By: Genevie Ann M.D.   On: 05/03/2019 15:40      LOS: 5 days   Time spent: More than 50% of that time was spent in counseling  and/or coordination of care.  Antonieta Pert, MD [facility]  05/05/2019, 11:07 AM

## 2019-05-06 DIAGNOSIS — E43 Unspecified severe protein-calorie malnutrition: Secondary | ICD-10-CM

## 2019-05-06 LAB — VITAMIN A: Vitamin A (Retinoic Acid): 17 ug/dL — ABNORMAL LOW (ref 20.1–62.0)

## 2019-05-06 LAB — GLUCOSE, CAPILLARY
Glucose-Capillary: 97 mg/dL (ref 70–99)
Glucose-Capillary: 79 mg/dL (ref 70–99)
Glucose-Capillary: 100 mg/dL — ABNORMAL HIGH (ref 70–99)
Glucose-Capillary: 66 mg/dL — ABNORMAL LOW (ref 70–99)
Glucose-Capillary: 112 mg/dL — ABNORMAL HIGH (ref 70–99)
Glucose-Capillary: 57 mg/dL — ABNORMAL LOW (ref 70–99)
Glucose-Capillary: 68 mg/dL — ABNORMAL LOW (ref 70–99)
Glucose-Capillary: 94 mg/dL (ref 70–99)

## 2019-05-06 LAB — CBC
HCT: 21.4 % — ABNORMAL LOW (ref 36.0–46.0)
Hemoglobin: 7.2 g/dL — ABNORMAL LOW (ref 12.0–15.0)
MCH: 34.1 pg — ABNORMAL HIGH (ref 26.0–34.0)
MCHC: 33.6 g/dL (ref 30.0–36.0)
MCV: 101.4 fL — ABNORMAL HIGH (ref 80.0–100.0)
Platelets: 93 10*3/uL — ABNORMAL LOW (ref 150–400)
RBC: 2.11 MIL/uL — ABNORMAL LOW (ref 3.87–5.11)
RDW: 18.5 % — ABNORMAL HIGH (ref 11.5–15.5)
WBC: 8.5 10*3/uL (ref 4.0–10.5)
nRBC: 0 % (ref 0.0–0.2)

## 2019-05-06 LAB — BASIC METABOLIC PANEL
Anion gap: 12 (ref 5–15)
BUN: 22 mg/dL — ABNORMAL HIGH (ref 6–20)
CO2: 14 mmol/L — ABNORMAL LOW (ref 22–32)
Calcium: 8.1 mg/dL — ABNORMAL LOW (ref 8.9–10.3)
Chloride: 115 mmol/L — ABNORMAL HIGH (ref 98–111)
Creatinine, Ser: 1.16 mg/dL — ABNORMAL HIGH (ref 0.44–1.00)
GFR calc Af Amer: >60 mL/min (ref >60)
GFR calc non Af Amer: 54 mL/min — ABNORMAL LOW (ref >60)
Glucose, Bld: 98 mg/dL (ref 70–99)
Potassium: 3.8 mmol/L (ref 3.5–5.1)
Sodium: 141 mmol/L (ref 135–145)

## 2019-05-06 LAB — VITAMIN E
Vitamin E (Alpha Tocopherol): 5.2 mg/L — ABNORMAL LOW (ref 7.0–25.1)
Vitamin E(Gamma Tocopherol): 1 mg/L (ref 0.5–5.5)

## 2019-05-06 LAB — PHOSPHORUS: Phosphorus: 4 mg/dL (ref 2.5–4.6)

## 2019-05-06 LAB — MAGNESIUM: Magnesium: 2.3 mg/dL (ref 1.7–2.4)

## 2019-05-06 NOTE — Progress Notes (Addendum)
Notified Jessica, PA that dressing became saturated after administering morning tube feed.    Gastrostomy  tube site is continuously leaking, especially after tube feeds are administered. The dressing has been changed several times today due to it becoming saturated. A gurgling sound can be heard at the insertion site. The PA with surgery was notified of this earlier today and she said she will reassess in the morning.

## 2019-05-06 NOTE — Progress Notes (Signed)
[facility]/Trauma Progress Note  2 Days Post-Op   Assessment/Plan DM CKD HTN GERD Hx seizures  Chronic anemia  Hypoglycemia- resolved *above per medicine and appreciate their assistance*  FTT  Protein Calorie Malnutrition (pre-alb 9.9) Dysphagia andPostprandial emesis  - Patient with Hx of Roux-en-Y in 2007.The patient suffered from weight loss and malnutrition over the last several yearsafter struggling withstenosis related to ulcer at the gastrojejunostomy site requiring multiple endoscopies and dilations.She ultimately underwent alaparoscopic revision of gastrojejunostomy and insertion of gastrostomy tube to remnant stomachin 2019 by Dr. Kieth Brightly. G-tube has since been removed.  - EGD on 1/6 that showed no stricture or stenosis and a small gastric remnant.  - Upper GI1/7showed slow clearance of the esophagus with esophageal dysmotility, no stricture. This also showed small gastric pouch with immediate emptying into the small bowel.  - IR did note feel patients anatomy was amenable to percutaneous gastrostomy tube placement -s/p laparoscopic gastrostomy tube insertion, Dr. Quinn Axe, 01/08 - Tube feeds Macrocytic anemia/folate deficiency: - Patient received 1 unit PRBC for hemoglobin of 6.4 g. Monitor - Hgb today 7.2, am labs  FEN -tube feeds and carb modified VTE -SCDs ID -no antibiotics currently, afebrile, WBC 8.5 Follow up: Dr. Kieth Brightly  Plan: continue tube feeds per Dr. Felizardo Hoffmann orders. Will need [place] set up for home tube feedings, orders placed. PT recs pending.    Pt lives at home with her fiance and 6yo son   LOS: 6 days    Subjective: CC: abdominal soreness  Pt is having loose stools today and states she is having a difficult time controlling her stools. This is new. No urinary issues. No nausea or vomiting. Tolerating tube feeds. She lives at home with her fiance and 53 yo son.   Objective: Vital signs in last 24  hours: Temp:  [97.6 F (36.4 C)-97.8 F (36.6 C)] 97.8 F (36.6 C) (01/10 0318) Pulse Rate:  [89-92] 91 (01/10 0318) Resp:  [18-20] 20 (01/10 0318) BP: (127-131)/(88-99) 131/88 (01/10 0318) SpO2:  [100 %] 100 % (01/10 0318) Weight:  [51.4 kg] 51.4 kg (01/10 0500) Last BM Date: 05/05/19  Intake/Output from previous day: No intake/output data recorded. Intake/Output this shift: No intake/output data recorded.  PE:  Gen:  Alert, NAD, pleasant, cooperative Card: RRR Pulm:  Rate and effort normal, CTA BL, no W/R/R noted Abd: Soft, ND, port sites with glue intact are well appearing and are without drainage or signs of infection. G tube site is clean and redressed. Generalized TTP without guarding. No peritonitis.  Skin: no rashes noted, warm and dry   Anti-infectives: Anti-infectives (From admission, onward)   Start     Dose/Rate Route Frequency Ordered Stop   05/04/19 1015  ceFAZolin (ANCEF) IVPB 2g/100 mL premix     2 g 200 mL/hr over 30 Minutes Intravenous To Short Stay 05/04/19 0958 05/05/19 1015   05/01/19 0300  vancomycin (VANCOREADY) IVPB 500 mg/100 mL  Status:  Discontinued     500 mg 100 mL/hr over 60 Minutes Intravenous Every 24 hours 04/30/19 0833 05/02/19 1348   04/30/19 2200  vancomycin (VANCOREADY) IVPB 500 mg/100 mL  Status:  Discontinued     500 mg 100 mL/hr over 60 Minutes Intravenous Every 24 hours 04/30/19 0103 04/30/19 0833   04/30/19 1000  ceFEPIme (MAXIPIME) 2 g in sodium chloride 0.9 % 100 mL IVPB  Status:  Discontinued     2 g 200 mL/hr over 30 Minutes Intravenous Every 12 hours 04/30/19 0103 05/04/19 1552  04/29/19 2300  vancomycin (VANCOCIN) IVPB 1000 mg/200 mL premix     1,000 mg 200 mL/hr over 60 Minutes Intravenous  Once 04/29/19 2247 04/30/19 0447   04/29/19 2300  ceFEPIme (MAXIPIME) 2 g in sodium chloride 0.9 % 100 mL IVPB     2 g 200 mL/hr over 30 Minutes Intravenous  Once 04/29/19 2247 04/30/19 0020      Lab Results:  Recent Labs     05/05/19 0438 05/06/19 0321  WBC 9.1 8.5  HGB 7.8* 7.2*  HCT 23.4* 21.4*  PLT 98* 93*   BMET Recent Labs    05/04/19 0241  NA 137  K 3.3*  CL 116*  CO2 14*  GLUCOSE 110*  BUN 18  CREATININE 1.18*  CALCIUM 7.7*   PT/INR No results for input(s): LABPROT, INR in the last 72 hours. CMP     Component Value Date/Time   NA 137 05/04/2019 0241   K 3.3 (L) 05/04/2019 0241   CL 116 (H) 05/04/2019 0241   CO2 14 (L) 05/04/2019 0241   GLUCOSE 110 (H) 05/04/2019 0241   BUN 18 05/04/2019 0241   CREATININE 1.18 (H) 05/04/2019 0241   CALCIUM 7.7 (L) 05/04/2019 0241   PROT 4.2 (L) 05/04/2019 0241   ALBUMIN 1.6 (L) 05/04/2019 0241   AST 20 05/04/2019 0241   ALT 23 05/04/2019 0241   ALKPHOS 113 05/04/2019 0241   BILITOT 0.7 05/04/2019 0241   GFRNONAA 53 (L) 05/04/2019 0241   GFRAA >60 05/04/2019 0241   Lipase     Component Value Date/Time   LIPASE 13 04/29/2019 1650    Studies/Results: No results found.   Kalman Drape, PA-C [facility] Please see amion for pager for the following: Myna Hidalgo, W, & Friday 7:00am - 4:30pm Thursdays 7:00am -11:30am

## 2019-05-06 NOTE — Progress Notes (Signed)
PROGRESS NOTE    Angie [name]  [id number] DOB: 11-17-1965 DOA: 04/29/2019 PCP: Harvie Junior, MD   Brief Narrative: Angie [name] is a 54 y.o. female with history of gastric bypass surgery prior to which she had a history of diabetes mellitus CKD stage III, chronic anemia, chronic malnutrition with history of esophageal strictures, bipolar disorder and chronic pain, chronic anemia. Patient admitted for unresponsive episode secondary to hypoglycemia in the setting of failure to thrive. 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 Patient has gastrostomy tube with tube feeds.   Assessment & Plan:   Principal Problem:   Hypoglycemia Active Problems:   S/P gastric bypass   Intractable nausea and vomiting   Biliary anastomotic occlusion   Loss of weight   Hypothermia   Nausea & vomiting   Failure to thrive in adult   History of gastric bypass   Postprandial vomiting Dysphagia Failure to thrive Patient has a history of Roux-en-Y gastric bypass. EGD performed on 1/6 and was significant for tiny gastric remnant. Concern is gastric remnant is too small to allow for proper nutritional absorption. Gastrostomy tube placed by general surgery -General surgery recommendations: Tube feeds -Monitor thiamine, zinc, copper, vitamins A, D, E, K  Electrolyte abnormalities Patient has had hyponatremia, hypomagnesemia, hypophosphatemia, hypokalemia. -Treat as needed  Hypothermia Likely secondary malnourishment. No evidence of infection. Resolved.  Severe hepatic steatosis Noted on MRCP.  Distended gallbladder Sludge noted on abdominal ultrasound with no gallbladder wall thickening. Associated biliary duct dilation with no mass or calculus seen. MRI significant for common bile duct up to 7 mm. No abdominal pain. AST/ALT/Alkaline phosphatase normal  Bilateral hydronephrosis, moderate Hydroureter Noted on MRI. Creatinine  stable. -Watch  Macrocytic anemia Low folate. Normal/high vitamin B12. Hemoglobin trending down after most recent transfusion on 04/30/2019. She has received 1 unis of PRBC to date. Iron studies suggest adequate iron supplementation. FOBT negative. -Continue folate/thiamine supplementation -Daily CBC  Bilateral LE edema Anasarca Likely secondary to poor albumin.  -Elevate legs -Nutrition  Leukopenia Resolved.  CKD stage III Stable.  Liver lesion Complex cystic lesion of left lobe.  Generalized weakness Likely secondary to failure to thrive. Spine imaging significant for mild lower lumbar spondylosis. No evidence of central canal/foraminal narrowing on thoracic/lumbar MRI -PT ordered and recommendations pending  Bipolar disorder Anxiety -Continue Seroquel, Xanax  Chronic pain syndrome -Continue oxycodone  Poor IV access -PICC line placed   DVT prophylaxis: SCDs Code Status:   Code Status: Full Code Family Communication: None Disposition Plan: Discharge per primary    Procedures:   04/30/2019: Transthoracic Echocardiogram IMPRESSIONS    1. Left ventricular ejection fraction, by visual estimation, is 50 to 55%. The left ventricle has normal function. There is no left ventricular hypertrophy.  2. The left ventricle has no regional wall motion abnormalities.  3. Global right ventricle has normal systolic function.The right ventricular size is normal. No increase in right ventricular wall thickness.  4. Left atrial size was normal.  5. Right atrial size was normal.  6. The mitral valve is normal in structure. No evidence of mitral valve regurgitation.  7. The tricuspid valve is normal in structure.  8. The aortic valve is normal in structure. Aortic valve regurgitation is not visualized.  9. The pulmonic valve was normal in structure. Pulmonic valve regurgitation is not visualized. 10. Normal pulmonary artery systolic pressure. 11. The atrial septum is grossly  normal.   05/02/2019: EGD Impression:  1. Status post Roux-en-Y gastric bypass surgery. No                            stricture or stenosis                           2. Tiny gastric remnant. I wonder if gastric volume                            is so small as to explain her issues with eating                            difficulties, weight loss, and metabolic                            abnormalities. Recommendation:           1. Soft diet                           2. Upper GI series to evaluate gastric remnant size                           3. Ask bariatric surgeons to weigh in on her                            current anatomy to see if she would benefit from                            reversal of her surgery. This could likely be done                            endoscopically.                           4. We will follow   05/04/2019: Laparoscopic gastrostomy tube insertion  Antimicrobials:  Vancomycin  Cefepime    Subjective: Copious leaking from G tube site earlier today.  Objective: Vitals:   05/05/19 2300 05/06/19 0318 05/06/19 0500 05/06/19 0852  BP: (!) 129/92 131/88  (!) 154/104  Pulse: 89 91  98  Resp: 20 20  11   Temp: 97.6 F (36.4 C) 97.8 F (36.6 C)  97.9 F (36.6 C)  TempSrc: Oral Oral  Oral  SpO2: 100% 100%  100%  Weight:   51.4 kg   Height:        Intake/Output Summary (Last 24 hours) at 05/06/2019 1102 Last data filed at 05/06/2019 0833 Gross per 24 hour  Intake 240 ml  Output --  Net 240 ml   Filed Weights   05/02/19 1031 05/03/19 0406 05/06/19 0500  Weight: 54.7 kg 57.1 kg 51.4 kg    Examination:  General exam: Appears calm and comfortable. Thin appearing. Respiratory system: Clear to auscultation. Respiratory effort normal. Cardiovascular system: S1 & S2 heard, RRR. No murmurs, rubs, gallops or clicks. Gastrointestinal system: Abdomen is nondistended, soft and nontender. G-tube dressing is dry and intact. No organomegaly or  masses felt. Normal bowel sounds heard. Central nervous system: Alert and oriented. No focal  neurological deficits. Extremities: No edema. No calf tenderness Skin: No cyanosis. No rashes Psychiatry: Judgement and insight appear normal. Mood & affect appropriate.     Data Reviewed: I have personally reviewed following labs and imaging studies  CBC: Recent Labs  Lab 04/29/19 1546 04/29/19 2359 04/30/19 0554 05/01/19 0949 05/02/19 0334 05/03/19 0455 05/04/19 0241 05/05/19 0438 05/06/19 0321  WBC 2.0*  2.0* 3.2* 5.9 1.8* 5.0 4.4 6.7 9.1 8.5  NEUTROABS 1.3* 2.4 4.1 1.5*  --   --   --   --   --   HGB 7.4*  7.4* 6.4* 9.0* 10.0* 8.8* 8.0* 7.9* 7.8* 7.2*  HCT 24.7*  24.6* 19.7* 28.3* 30.0* 26.1* 23.7* 23.8* 23.4* 21.4*  MCV 116.0*  116.0* 107.1* 105.2* 99.7 99.6 100.0 100.4* 101.3* 101.4*  PLT 186  175 210 179 167 165 136* 122* 98* 93*   Basic Metabolic Panel: Recent Labs  Lab 05/01/19 0804 05/02/19 0334 05/03/19 0455 05/04/19 0241 05/05/19 0438 05/06/19 0321  NA 137 140 140 137  --  141  K 4.2 4.1 3.3* 3.3*  --  3.8  CL 111 117* 117* 116*  --  115*  CO2 16* 14* 15* 14*  --  14*  GLUCOSE 237* 89 283* 110*  --  98  BUN 12 13 16 18   --  22*  CREATININE 1.28* 1.22* 1.12* 1.18*  --  1.16*  CALCIUM 7.8* 8.2* 7.9* 7.7*  --  8.1*  MG 1.5* 2.1 2.0 1.7 1.6* 2.3  PHOS  --  4.0 3.0 2.3* 2.3* 4.0   GFR: Estimated Creatinine Clearance: 40.7 mL/min (A) (by C-G formula based on SCr of 1.16 mg/dL (H)). Liver Function Tests: Recent Labs  Lab 04/30/19 0554 05/01/19 0804 05/02/19 0334 05/03/19 0455 05/04/19 0241  AST 39 31 29 23 20   ALT 26 29 28 25 23   ALKPHOS 162* 165* 138* 109 113  BILITOT 1.2 0.8 0.9 1.0 0.7  PROT 4.8* 5.0* 4.9* 4.4* 4.2*  ALBUMIN 2.0* 2.1* 1.9* 1.7* 1.6*   Recent Labs  Lab 04/29/19 1650  LIPASE 13   Recent Labs  Lab 04/29/19 1650  AMMONIA 38*   Coagulation Profile: Recent Labs  Lab 05/01/19 0949  INR 1.4*   Cardiac Enzymes: Recent Labs   Lab 04/29/19 2359  CKTOTAL 120   BNP (last 3 results) No results for input(s): PROBNP in the last 8760 hours. HbA1C: No results for input(s): HGBA1C in the last 72 hours. CBG: Recent Labs  Lab 05/05/19 1854 05/05/19 2022 05/05/19 2302 05/06/19 0312 05/06/19 0856  GLUCAP 88 154* 160* 97 79   Lipid Profile: No results for input(s): CHOL, HDL, LDLCALC, TRIG, CHOLHDL, LDLDIRECT in the last 72 hours. Thyroid Function Tests: No results for input(s): TSH, T4TOTAL, FREET4, T3FREE, THYROIDAB in the last 72 hours. Anemia Panel: Recent Labs    05/03/19 1221  VITAMINB12 >7,500*   Sepsis Labs: Recent Labs  Lab 04/29/19 1650 05/02/19 0334 05/03/19 0455  PROCALCITON 7.56 1.87 0.99  LATICACIDVEN 1.0  --   --     Recent Results (from the past 240 hour(s))  Blood culture (routine x 2)     Status: None   Collection Time: 04/29/19  4:20 PM   Specimen: Right Antecubital; Blood  Result Value Ref Range Status   Specimen Description RIGHT ANTECUBITAL  Final   Special Requests   Final    BOTTLES DRAWN AEROBIC AND ANAEROBIC Blood Culture adequate volume   Culture   Final    NO GROWTH 5  DAYS Performed at [facility], [place] [address] [place], [place] [address]    Report Status 05/04/2019 FINAL  Final  Culture, Urine     Status: None   Collection Time: 04/29/19  5:15 PM   Specimen: Urine, Random  Result Value Ref Range Status   Specimen Description URINE, RANDOM  Final   Special Requests NONE  Final   Culture   Final    NO GROWTH Performed at [facility], [place] [address] [place], [place] [address]    Report Status 05/01/2019 FINAL  Final  SARS CORONAVIRUS 2 (TAT 6-24 HRS) Nasopharyngeal Nasopharyngeal Swab     Status: None   Collection Time: 04/29/19  8:23 PM   Specimen: Nasopharyngeal Swab  Result Value Ref Range Status   SARS Coronavirus 2 NEGATIVE NEGATIVE Final    Comment: (NOTE) SARS-CoV-2 target nucleic acids are NOT DETECTED. The SARS-CoV-2 RNA is  generally detectable in upper and lower respiratory specimens during the acute phase of infection. Negative results do not preclude SARS-CoV-2 infection, do not rule out co-infections with other pathogens, and should not be used as the sole basis for treatment or other patient management decisions. Negative results must be combined with clinical observations, patient history, and epidemiological information. The expected result is Negative. Fact Sheet for Patients: SugarRoll.be Fact Sheet for Healthcare Providers: https://www.woods-mathews.com/ This test is not yet approved or cleared by the [place] FDA and  has been authorized for detection and/or diagnosis of SARS-CoV-2 by FDA under an Emergency Use Authorization (EUA). This EUA will remain  in effect (meaning this test can be used) for the duration of the COVID-19 declaration under Section 56 4(b)(1) of the Act, 21 U.S.C. section 360bbb-3(b)(1), unless the authorization is terminated or revoked sooner. Performed at [facility], [place] [address], [place], [place] [address]   Blood culture (routine x 2)     Status: None   Collection Time: 04/29/19 11:09 PM   Specimen: BLOOD  Result Value Ref Range Status   Specimen Description BLOOD LEFT ANTECUBITAL  Final   Special Requests   Final    BOTTLES DRAWN AEROBIC AND ANAEROBIC Blood Culture adequate volume   Culture   Final    NO GROWTH 5 DAYS Performed at [facility], [place] [address], [place], [place] [address]    Report Status 05/05/2019 FINAL  Final  MRSA PCR Screening     Status: None   Collection Time: 04/30/19  5:31 PM   Specimen: Nasopharyngeal  Result Value Ref Range Status   MRSA by PCR NEGATIVE NEGATIVE Final    Comment:        The GeneXpert MRSA Assay (FDA approved for NASAL specimens only), is one component of a comprehensive MRSA colonization surveillance program. It is not intended to diagnose  MRSA infection nor to guide or monitor treatment for MRSA infections. Performed at [facility], [place] [address], [place], [place] [address]          Radiology Studies: No results found.      Scheduled Meds: . cholecalciferol  3,000 Units Per Tube Daily  . feeding supplement (JEVITY 1.2 CAL)  60 mL Per Tube Q4H  . folic acid  1 mg Intravenous Daily  . free water  40 mL Per Tube 5 X Daily  . influenza vac split quadrivalent PF  0.5 mL Intramuscular Tomorrow-1000  . OLANZapine  2.5 mg Oral Daily  . oxyCODONE  10 mg Oral Q12H  .  pantoprazole  40 mg Oral QHS  . phosphorus  500 mg Oral TID WC  . QUEtiapine  50 mg Oral QHS  . thiamine injection  100 mg Intravenous Daily   Continuous Infusions: . lactated ringers Stopped (05/02/19 1216)  . lactated ringers 500 mL/hr at 05/04/19 1347     LOS: 6 days     Cordelia Poche, MD [facility] 05/06/2019, 11:02 AM  If 7PM-7AM, please contact night-coverage [email or web address]

## 2019-05-07 LAB — BASIC METABOLIC PANEL
Anion gap: 6 (ref 5–15)
BUN: 18 mg/dL (ref 6–20)
CO2: 20 mmol/L — ABNORMAL LOW (ref 22–32)
Calcium: 7.8 mg/dL — ABNORMAL LOW (ref 8.9–10.3)
Chloride: 114 mmol/L — ABNORMAL HIGH (ref 98–111)
Creatinine, Ser: 1.02 mg/dL — ABNORMAL HIGH (ref 0.44–1.00)
GFR calc Af Amer: >60 mL/min (ref >60)
GFR calc non Af Amer: >60 mL/min (ref >60)
Glucose, Bld: 72 mg/dL (ref 70–99)
Potassium: 4.1 mmol/L (ref 3.5–5.1)
Sodium: 140 mmol/L (ref 135–145)

## 2019-05-07 LAB — CBC
HCT: 21.4 % — ABNORMAL LOW (ref 36.0–46.0)
Hemoglobin: 7 g/dL — ABNORMAL LOW (ref 12.0–15.0)
MCH: 32.7 pg (ref 26.0–34.0)
MCHC: 32.7 g/dL (ref 30.0–36.0)
MCV: 100 fL (ref 80.0–100.0)
Platelets: 99 10*3/uL — ABNORMAL LOW (ref 150–400)
RBC: 2.14 MIL/uL — ABNORMAL LOW (ref 3.87–5.11)
RDW: 17.9 % — ABNORMAL HIGH (ref 11.5–15.5)
WBC: 6.1 10*3/uL (ref 4.0–10.5)
nRBC: 0 % (ref 0.0–0.2)

## 2019-05-07 LAB — VITAMIN B1: Vitamin B1 (Thiamine): 133.5 nmol/L (ref 66.5–200.0)

## 2019-05-07 LAB — GLUCOSE, CAPILLARY
Glucose-Capillary: 59 mg/dL — ABNORMAL LOW (ref 70–99)
Glucose-Capillary: 75 mg/dL (ref 70–99)
Glucose-Capillary: 62 mg/dL — ABNORMAL LOW (ref 70–99)
Glucose-Capillary: 106 mg/dL — ABNORMAL HIGH (ref 70–99)
Glucose-Capillary: 85 mg/dL (ref 70–99)
Glucose-Capillary: 107 mg/dL — ABNORMAL HIGH (ref 70–99)
Glucose-Capillary: 33 mg/dL — CL (ref 70–99)
Glucose-Capillary: 82 mg/dL (ref 70–99)

## 2019-05-07 LAB — ZINC: Zinc: 57 ug/dL (ref 44–115)

## 2019-05-07 LAB — COPPER, SERUM: Copper: 61 ug/dL — ABNORMAL LOW (ref 80–158)

## 2019-05-07 LAB — PHOSPHORUS: Phosphorus: 3.9 mg/dL (ref 2.5–4.6)

## 2019-05-07 LAB — MAGNESIUM: Magnesium: 1.9 mg/dL (ref 1.7–2.4)

## 2019-05-07 MED ORDER — METHOCARBAMOL 500 MG PO TABS
500.0000 mg | ORAL_TABLET | Freq: Three times a day (TID) | ORAL | Status: DC
  Administered 2019-05-07 – 2019-05-11 (×13): 500 mg via ORAL
  Filled 2019-05-07 (×13): qty 1

## 2019-05-07 MED ORDER — LISINOPRIL-HYDROCHLOROTHIAZIDE 10-12.5 MG PO TABS: 1.0000 | ORAL_TABLET | Freq: Every day | ORAL | Status: DC

## 2019-05-07 MED ORDER — VITAMIN E 180 MG (400 UNIT) PO CAPS
400.0000 [IU] | ORAL_CAPSULE | Freq: Every day | ORAL | Status: DC
  Administered 2019-05-07 – 2019-05-10 (×4): 400 [IU]
  Filled 2019-05-07 (×5): qty 1

## 2019-05-07 MED ORDER — PROSIGHT PO TABS
1.0000 | ORAL_TABLET | Freq: Every day | ORAL | Status: DC
  Administered 2019-05-07 – 2019-05-10 (×4): 1 via ORAL
  Filled 2019-05-07 (×4): qty 1

## 2019-05-07 MED ORDER — OCUVITE-LUTEIN PO CAPS
1.0000 | ORAL_CAPSULE | Freq: Every day | ORAL | Status: DC
  Filled 2019-05-07: qty 1

## 2019-05-07 MED ORDER — VITAMIN A 3 MG (10000 UNIT) PO CAPS
10000.0000 [IU] | ORAL_CAPSULE | Freq: Every day | ORAL | Status: DC
  Administered 2019-05-07 – 2019-05-10 (×4): 10000 [IU]
  Filled 2019-05-07 (×5): qty 1

## 2019-05-07 MED ORDER — HYDROCHLOROTHIAZIDE 12.5 MG PO CAPS
12.5000 mg | ORAL_CAPSULE | Freq: Every day | ORAL | Status: DC
  Administered 2019-05-08: 12.5 mg via ORAL
  Filled 2019-05-07 (×2): qty 1

## 2019-05-07 MED ORDER — LISINOPRIL 10 MG PO TABS
10.0000 mg | ORAL_TABLET | Freq: Every day | ORAL | Status: DC
  Administered 2019-05-07: 10 mg via ORAL
  Filled 2019-05-07 (×2): qty 1

## 2019-05-07 MED ORDER — OSMOLITE 1.5 CAL PO LIQD
80.0000 mL | Freq: Every day | ORAL | Status: DC
  Administered 2019-05-07: 100 mL
  Administered 2019-05-07 – 2019-05-08 (×4): 80 mL
  Administered 2019-05-08: 40 mL
  Administered 2019-05-08: 60 mL
  Administered 2019-05-09: 237 mL
  Administered 2019-05-09: 60 mL
  Administered 2019-05-09 (×3): 237 mL
  Filled 2019-05-07 (×17): qty 237

## 2019-05-07 NOTE — Progress Notes (Signed)
[facility]/Tra[facility] Progress Note  3 Days Post-Op   Assessment/Plan DM CKD HTN GERD Hx seizures  Chronic anemia  Hypoglycemia- resolved *above per medicine and appreciate their assistance*  FTT  Protein Calorie Malnutrition (pre-alb 9.9) Dysphagia andPostprandial emesis  - Patient with Hx of Roux-en-Y in 2007.The patient suffered from weight loss and malnutrition over the last several yearsafter struggling withstenosis related to ulcer at the gastrojejunostomy site requiring multiple endoscopies and dilations.She ultimately underwent alaparoscopic revision of gastrojejunostomy and insertion of gastrostomy tube to remnant stomachin 2019 by Dr. Kieth Brightly. G-tube has since been removed.  - EGD on 1/6 that showed no stricture or stenosis and a small gastric remnant.  - Upper GI1/7showed slow clearance of the esophagus with esophageal dysmotility, no stricture. This also showed small gastric pouch with immediate emptying into the small bowel.  - IR did note feel patients anatomy was amenable to percutaneous gastrostomy tube placement -s/p laparoscopic gastrostomy tube insertion, Dr. Quinn Axe, 01/08 - Tube feeds Macrocytic anemia/folate deficiency: - Patient received 1 unit PRBC for hemoglobin of 6.4 g. Monitor - Hgb today 7.0 down from 7.2 yesterday. Does not appear symptomatic. am labs  FEN -tube feeds and carb modified VTE -SCDs ID -no antibiotics currently, afebrile, WBC 6.1 Follow up: Dr. Kieth Brightly  Plan: continue tube feedsper Dr. Felizardo Hoffmann orders. Will need [place] set up for home tube feedings, orders placed. PT recs pending.Monitor Hgb. Will discuss G tube drainage with Dr. Kieth Brightly.  Pt lives at home with her fiance and 6yo son   LOS: 7 days    Subjective: CC: abdominal pain  Pt states cramps of her abdominal muscles around her G tube. She is having normal BM's. No vomiting. States her G tube is leaking like it did when she had one  before. She states Dr. Kieth Brightly "stitched" it last time it did this. She is eating some. She is tolerating her bolus feeds.   Objective: Vital signs in last 24 hours: Temp:  [97.9 F (36.6 C)-99 F (37.2 C)] 98.5 F (36.9 C) (01/11 0754) Pulse Rate:  [78-98] 78 (01/11 0754) Resp:  [11-18] 13 (01/11 0754) BP: (138-154)/(98-106) 138/105 (01/11 0754) SpO2:  [100 %] 100 % (01/11 0754) Last BM Date: 05/06/19  Intake/Output from previous day: 01/10 0701 - 01/11 0700 In: 900 [P.O.:600; NG/GT:300] Out: -  Intake/Output this shift: No intake/output data recorded.  PE:  Gen: Alert, NAD, pleasant, cooperative Card: RRR Pulm:Rate andeffort normal, CTA BL, no W/R/R noted Abd: Soft, ND,port sites with glue intact are well appearing and are without drainage or signs of infection. G tube site with drainage that appears to be TF's and flushes. Skin surrounding site is without signs of infection. Generalized TTP without guarding. No peritonitis. Skin: no rashes noted, warm and dry   Anti-infectives: Anti-infectives (From admission, onward)   Start     Dose/Rate Route Frequency Ordered Stop   05/04/19 1015  ceFAZolin (ANCEF) IVPB 2g/100 mL premix     2 g 200 mL/hr over 30 Minutes Intravenous To Short Stay 05/04/19 0958 05/05/19 1015   05/01/19 0300  vancomycin (VANCOREADY) IVPB 500 mg/100 mL  Status:  Discontinued     500 mg 100 mL/hr over 60 Minutes Intravenous Every 24 hours 04/30/19 0833 05/02/19 1348   04/30/19 2200  vancomycin (VANCOREADY) IVPB 500 mg/100 mL  Status:  Discontinued     500 mg 100 mL/hr over 60 Minutes Intravenous Every 24 hours 04/30/19 0103 04/30/19 0833   04/30/19 1000  ceFEPIme (MAXIPIME) 2 g  in sodium chloride 0.9 % 100 mL IVPB  Status:  Discontinued     2 g 200 mL/hr over 30 Minutes Intravenous Every 12 hours 04/30/19 0103 05/04/19 1552   04/29/19 2300  vancomycin (VANCOCIN) IVPB 1000 mg/200 mL premix     1,000 mg 200 mL/hr over 60 Minutes Intravenous  Once  04/29/19 2247 04/30/19 0447   04/29/19 2300  ceFEPIme (MAXIPIME) 2 g in sodium chloride 0.9 % 100 mL IVPB     2 g 200 mL/hr over 30 Minutes Intravenous  Once 04/29/19 2247 04/30/19 0020      Lab Results:  Recent Labs    05/06/19 0321 05/07/19 0305  WBC 8.5 6.1  HGB 7.2* 7.0*  HCT 21.4* 21.4*  PLT 93* 99*   BMET Recent Labs    05/06/19 0321 05/07/19 0305  NA 141 140  K 3.8 4.1  CL 115* 114*  CO2 14* 20*  GLUCOSE 98 72  BUN 22* 18  CREATININE 1.16* 1.02*  CALCIUM 8.1* 7.8*   PT/INR No results for input(s): LABPROT, INR in the last 72 hours. CMP     Component Value Date/Time   NA 140 05/07/2019 0305   K 4.1 05/07/2019 0305   CL 114 (H) 05/07/2019 0305   CO2 20 (L) 05/07/2019 0305   GLUCOSE 72 05/07/2019 0305   BUN 18 05/07/2019 0305   CREATININE 1.02 (H) 05/07/2019 0305   CALCIUM 7.8 (L) 05/07/2019 0305   PROT 4.2 (L) 05/04/2019 0241   ALBUMIN 1.6 (L) 05/04/2019 0241   AST 20 05/04/2019 0241   ALT 23 05/04/2019 0241   ALKPHOS 113 05/04/2019 0241   BILITOT 0.7 05/04/2019 0241   GFRNONAA >60 05/07/2019 0305   GFRAA >60 05/07/2019 0305   Lipase     Component Value Date/Time   LIPASE 13 04/29/2019 1650    Studies/Results: No results found.   Kalman Drape, PA-C [facility] Please see amion for pager for the following: Myna Hidalgo, W, & Friday 7:00am - 4:30pm Thursdays 7:00am -11:30am

## 2019-05-07 NOTE — TOC Initial Note (Signed)
Transition of Care [facility]) - Initial/Assessment Note    Patient Details  Name: Angie [name] MRN: [id number] Date of Birth: 01-26-1966  Transition of Care [facility]) CM/SW Contact:    Carles Collet, RN Phone Number: 05/07/2019, 11:03 AM  Clinical Narrative:                Damaris Schooner w patient over the phone to discuss initial home needs. Patien lives with fiance and adult child. Patient chose [facility] for [facility] services. Butch Penny notified. Referral made to [facility] for tube feedings. [facility] will need DME tube feeding order with dietician note copied into comments several days prior to DC to be able to order chosen formula.    Expected Discharge Plan: [place] Barriers to Discharge: Continued Medical Work up   Patient Goals and CMS Choice Patient states their goals for this hospitalization and ongoing recovery are:: to go home CMS Medicare.gov Compare Post Acute Care list provided to:: Patient Choice offered to / list presented to : Patient  Expected Discharge Plan and Services Expected Discharge Plan: [place]   Discharge Planning Services: CM Consult Post Acute Care Choice: Durable Medical Equipment, Home Health                   DME Arranged: Tube feeding DME Agency: [facility] Date DME Agency Contacted: 05/07/19 Time DME Agency Contacted: [phone] Representative spoke with at DME Agency: [place]: RN [place] Agency: [place] ([place]) Date [place]: 05/07/19 Time [place]: 62 Representative spoke with at [place]: Butch Penny  Prior Living Arrangements/Services   Lives with:: Adult Children, Spouse                   Activities of Daily Living Home Assistive Devices/Equipment: Radio producer (specify quad or straight) ADL Screening (condition at time of admission) Patient's cognitive ability adequate to safely complete daily activities?: Yes Is the patient deaf or have difficulty hearing?: No Does the patient have  difficulty seeing, even when wearing glasses/contacts?: No Does the patient have difficulty concentrating, remembering, or making decisions?: Yes Patient able to express need for assistance with ADLs?: Yes Does the patient have difficulty dressing or bathing?: Yes Independently performs ADLs?: No Communication: Independent Is this a change from baseline?: Pre-admission baseline Dressing (OT): Needs assistance Is this a change from baseline?: Pre-admission baseline Grooming: Needs assistance Is this a change from baseline?: Pre-admission baseline Feeding: Independent Bathing: Needs assistance Is this a change from baseline?: Pre-admission baseline Toileting: Needs assistance Is this a change from baseline?: Pre-admission baseline In/Out Bed: Needs assistance Is this a change from baseline?: Pre-admission baseline Walks in Home: Independent Does the patient have difficulty walking or climbing stairs?: Yes Weakness of Legs: Both Weakness of Arms/Hands: Both  Permission Sought/Granted                  Emotional Assessment              Admission diagnosis:  Hypoglycemia [E16.2] Nausea & vomiting [R11.2] Intractable nausea and vomiting [R11.2] Patient Active Problem List   Diagnosis Date Noted  . Failure to thrive in adult   . History of gastric bypass   . Nausea & vomiting   . Hypothermia 04/29/2019  . 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 Symptomatic Anemia 04/25/2017  . Symptomatic anemia 04/25/2017  . Loss of weight   . Dysphagia   . S/P percutaneous endoscopic gastrostomy (PEG) tube  placement ([place]) 09/06/2016  . At risk for adverse drug event 08/25/2016  . Malnutrition following gastrointestinal surgery 08/16/2016  . Anastomotic ulcer 07/21/2016  . Transient alteration of awareness   . Alcohol use 04/27/2016  . Chronic pain syndrome 04/27/2016  . Iron deficiency anemia 04/27/2016  . B12 deficiency 04/27/2016  .  Biliary anastomotic occlusion   . Hypoglycemia 04/22/2016  . Syncope 04/22/2016  . Protein-calorie malnutrition, severe 04/22/2016  . Intractable nausea and vomiting   . Abdominal pain, chronic, epigastric   . Hypotension 02/01/2016  . Hyponatremia 02/01/2016  . Subacromial impingement of left shoulder 07/06/2013  . Trochanteric bursitis of left hip 05/11/2013  . Arthritis 05/16/2012  . S/P gastric bypass 05/16/2012   PCP:  Harvie Junior, MD Pharmacy:   [facility], [place] [address] Phone: [phone] Fax: [phone]     Social Determinants of Health (SDOH) Interventions    Readmission Risk Interventions No flowsheet data found.

## 2019-05-07 NOTE — Evaluation (Signed)
Physical Therapy Evaluation Patient Details Name: Angie [name] MRN: [id number] DOB: October 31, 1965 Today's Date: 05/07/2019   History of Present Illness  Angie [name] is a 54 y.o. female with history of gastric bypass surgery prior to which she had a history of diabetes mellitus CKD stage III, chronic anemia, chronic malnutrition with history of esophageal strictures, bipolar disorder and chronic pain, chronic anemia. Patient admitted for unresponsive episode secondary to hypoglycemia in the setting of failure to thrive.  Clinical Impression  Pt admitted with above diagnosis. Pt presents with profound weakness of the LE's and to a lesser extent, the UE's and trunk. Needs min A for mobility and was not able to tolerate ambulation yet today. Will need follow up PT after d/c from acute setting, recommend HHPT.  Pt currently with functional limitations due to the deficits listed below (see PT Problem List). Pt will benefit from skilled PT to increase their independence and safety with mobility to allow discharge to the venue listed below.       Follow Up Recommendations Home health PT    Equipment Recommendations  Wheelchair (measurements PT);Rolling walker with 5" wheels    Recommendations for Other Services OT consult     Precautions / Restrictions Precautions Precautions: Fall Restrictions Weight Bearing Restrictions: No      Mobility  Bed Mobility Overal bed mobility: Needs Assistance Bed Mobility: Supine to Sit     Supine to sit: Min assist     General bed mobility comments: pt struggled to get to EOB, uses UE to assist LE's off EOB, min A for LE's off bed  Transfers Overall transfer level: Needs assistance Equipment used: Rolling walker (2 wheeled);None Transfers: Sit to/from [employer] to Stand: Min assist Stand pivot transfers: Min assist       General transfer comment: min A for power up. Transferred bed to [facility] and then to recliner. Used RW  for support and pain control for last transfer.   Ambulation/Gait Ambulation/Gait assistance: Min assist Gait Distance (Feet): 3 Feet Assistive device: Rolling walker (2 wheeled) Gait Pattern/deviations: Step-through pattern;Decreased stride length Gait velocity: decreased Gait velocity interpretation: <1.31 ft/sec, indicative of household ambulator General Gait Details: pt fatigues very quickly with mobility and 3/4 DOE  Financial trader Rankin (Stroke Patients Only)       Balance Overall balance assessment: Needs assistance Sitting-balance support: No upper extremity supported Sitting balance-Leahy Scale: Fair     Standing balance support: Bilateral upper extremity supported Standing balance-Leahy Scale: Fair Standing balance comment: able to maintain standing to perform perineal care                             Pertinent Vitals/Pain Pain Assessment: Faces Faces Pain Scale: Hurts even more Pain Location: abdominal cramping at tube insertion site Pain Descriptors / Indicators: Cramping Pain Intervention(s): Limited activity within patient's tolerance;Monitored during session;Premedicated before session    Home Living Family/patient expects to be discharged to:: Private residence Living Arrangements: Spouse/significant other Available Help at Discharge: Family;Available PRN/intermittently(nearly 24 hrs) Type of Home: House Home Access: Stairs to enter Entrance Stairs-Rails: Right;Left Entrance Stairs-Number of Steps: 5 Home Layout: Two level;Able to live on main level with bedroom/bathroom Home Equipment: None Additional Comments: pt lives with her fiance and 83 yo son. Fiance does not work. Her older children help out too if needed  Prior Function Level of Independence: Needs assistance   Gait / Transfers Assistance Needed: needed help getting off toilet and in and out of car, wasn't walking much  ADL's /  Homemaking Assistance Needed: needed help with some bathing and dressing        Hand Dominance   Dominant Hand: Right    Extremity/Trunk Assessment   Upper Extremity Assessment Upper Extremity Assessment: Generalized weakness    Lower Extremity Assessment Lower Extremity Assessment: Generalized weakness;RLE deficits/detail;LLE deficits/detail RLE Deficits / Details: hip flex 2/5, knee ext 3/5, hip abd 2/5 RLE Sensation: WNL RLE Coordination: WNL LLE Deficits / Details: hip flex 2-/5, knee ext 3-/5, hip abd 2-/5 LLE Sensation: WNL LLE Coordination: WNL    Cervical / Trunk Assessment Cervical / Trunk Assessment: Normal  Communication   Communication: No difficulties  Cognition Arousal/Alertness: Awake/alert Behavior During Therapy: WFL for tasks assessed/performed Overall Cognitive Status: Within Functional Limits for tasks assessed                                        General Comments General comments (skin integrity, edema, etc.): SpO2 >94% on RA. HR 88-105 bpm    Exercises General Exercises - Lower Extremity Ankle Circles/Pumps: AROM;Both;10 reps;Seated Gluteal Sets: AROM;Both;10 reps;Seated Long Arc Quad: AROM;Both;5 reps;Seated;Other (comment)(with 5 sec ext hold) Amputee Exercises Chair Push Up: AROM;5 reps;Seated   Assessment/Plan    PT Assessment Patient needs continued PT services  PT Problem List Decreased strength;Decreased activity tolerance;Decreased balance;Decreased mobility;Decreased knowledge of use of DME;Decreased knowledge of precautions;Pain;Cardiopulmonary status limiting activity       PT Treatment Interventions DME instruction;Gait training;Stair training;Functional mobility training;Therapeutic activities;Therapeutic exercise;Balance training;Patient/family education    PT Goals (Current goals can be found in the Care Plan section)  Acute Rehab PT Goals Patient Stated Goal: return home PT Goal Formulation: With  patient Time For Goal Achievement: 05/21/19 Potential to Achieve Goals: Good    Frequency Min 3X/week   Barriers to discharge Inaccessible home environment 5 STE    Co-evaluation               AM-PAC PT "6 Clicks" Mobility  Outcome Measure Help needed turning from your back to your side while in a flat bed without using bedrails?: A Little Help needed moving from lying on your back to sitting on the side of a flat bed without using bedrails?: A Little Help needed moving to and from a bed to a chair (including a wheelchair)?: A Little Help needed standing up from a chair using your arms (e.g., wheelchair or bedside chair)?: A Little Help needed to walk in hospital room?: A Lot Help needed climbing 3-5 steps with a railing? : Total 6 Click Score: 15    End of Session   Activity Tolerance: Patient limited by fatigue Patient left: in chair;with call bell/phone within reach Nurse Communication: Mobility status PT Visit Diagnosis: Unsteadiness on feet (R26.81);Muscle weakness (generalized) (M62.81)    Time: 1014-1050 PT Time Calculation (min) (ACUTE ONLY): 36 min   Charges:   PT Evaluation $PT Eval Moderate Complexity: 1 Mod PT Treatments $Therapeutic Activity: 8-22 mins        Leighton Roach, PT  Acute Rehab Services  Pager [phone] Office [place] 05/07/2019, 12:34 PM

## 2019-05-07 NOTE — Progress Notes (Signed)
PROGRESS NOTE    Angie [name]  [id number] DOB: [birth year] DOA: 04/29/2019 PCP: Harvie Junior, MD   Brief Narrative: Angie [name] is a 54 y.o. female with history of gastric bypass surgery prior to which she had a history of diabetes mellitus CKD stage III, chronic anemia, chronic malnutrition with history of esophageal strictures, bipolar disorder and chronic pain, chronic anemia. Patient admitted for unresponsive episode secondary to hypoglycemia in the setting of failure to thrive. 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 Patient has gastrostomy tube with tube feeds.   Assessment & Plan:   Principal Problem:   Hypoglycemia Active Problems:   S/P gastric bypass   Intractable nausea and vomiting   Biliary anastomotic occlusion   Loss of weight   Hypothermia   Nausea & vomiting   Failure to thrive in adult   History of gastric bypass   Postprandial vomiting Dysphagia Failure to thrive Patient has a history of Roux-en-Y gastric bypass. EGD performed on 1/6 and was significant for tiny gastric remnant. Concern is gastric remnant is too small to allow for proper nutritional absorption. Gastrostomy tube placed by general surgery. -General surgery recommendations: Tube feeds -Monitor thiamine, zinc, copper, vitamins A, D, E, K  Electrolyte abnormalities Patient has had hyponatremia, hypomagnesemia, hypophosphatemia, hypokalemia. -Treat as needed  Hypothermia Likely secondary malnourishment. No evidence of infection. Resolved.  Severe hepatic steatosis Noted on MRCP.  Distended gallbladder Sludge noted on abdominal ultrasound with no gallbladder wall thickening. Associated biliary duct dilation with no mass or calculus seen. MRI significant for common bile duct up to 7 mm. No abdominal pain. AST/ALT/Alkaline phosphatase normal  Bilateral hydronephrosis, moderate Hydroureter Noted on MRI. Creatinine  stable. -Watch  Macrocytic anemia Low folate. Normal/high vitamin B12. Hemoglobin trending down after most recent transfusion on 04/30/2019. She has received 1 unis of PRBC to date. Iron studies suggest adequate iron supplementation. FOBT negative. Hemoglobin trended down again today -Continue folate/thiamine supplementation -Daily CBC -Repeat H&H this afternoon  Hypoglycemia Recurrent. Asymptomatic -Continue to watch blood sugar  Essential hypertension Patient is on lisinopril-hydrochlorothiazide as an outpatient -Restart home lisinopril-hydrochlorothiazide  Bilateral LE edema Anasarca Likely secondary to poor albumin; last albumin of 1.6. Edema improved in lower legs after elevation -Elevate legs -Nutrition  Leukopenia Resolved.  CKD stage III Stable.  Liver lesion Complex cystic lesion of left lobe.  Generalized weakness Likely secondary to failure to thrive. Spine imaging significant for mild lower lumbar spondylosis. No evidence of central canal/foraminal narrowing on thoracic/lumbar MRI -PT ordered and recommendations pending  Bipolar disorder Anxiety -Continue Seroquel, Xanax  Chronic pain syndrome -Continue oxycodone  Poor IV access -PICC line placed   DVT prophylaxis: SCDs Code Status:   Code Status: Full Code Family Communication: None Disposition Plan: Discharge per primary    Procedures:   04/30/2019: Transthoracic Echocardiogram IMPRESSIONS    1. Left ventricular ejection fraction, by visual estimation, is 50 to 55%. The left ventricle has normal function. There is no left ventricular hypertrophy.  2. The left ventricle has no regional wall motion abnormalities.  3. Global right ventricle has normal systolic function.The right ventricular size is normal. No increase in right ventricular wall thickness.  4. Left atrial size was normal.  5. Right atrial size was normal.  6. The mitral valve is normal in structure. No evidence of mitral valve  regurgitation.  7. The tricuspid valve is normal in structure.  8. The aortic valve is normal in structure. Aortic valve regurgitation  is not visualized.  9. The pulmonic valve was normal in structure. Pulmonic valve regurgitation is not visualized. 10. Normal pulmonary artery systolic pressure. 11. The atrial septum is grossly normal.   05/02/2019: EGD Impression:               1. Status post Roux-en-Y gastric bypass surgery. No                            stricture or stenosis                           2. Tiny gastric remnant. I wonder if gastric volume                            is so small as to explain her issues with eating                            difficulties, weight loss, and metabolic                            abnormalities. Recommendation:           1. Soft diet                           2. Upper GI series to evaluate gastric remnant size                           3. Ask bariatric surgeons to weigh in on her                            current anatomy to see if she would benefit from                            reversal of her surgery. This could likely be done                            endoscopically.                           4. We will follow   05/04/2019: Laparoscopic gastrostomy tube insertion  Antimicrobials:  Vancomycin  Cefepime    Subjective: Still with significant drainage around G-tube site  Objective: Vitals:   05/06/19 1954 05/06/19 2300 05/07/19 0500 05/07/19 0754  BP: (!) 148/100 (!) 148/98  (!) 138/105  Pulse: 88 90  78  Resp: 16 18  13   Temp: 99 F (37.2 C) 98.9 F (37.2 C) 98.8 F (37.1 C) 98.5 F (36.9 C)  TempSrc: Oral Oral  Oral  SpO2: 100% 100%  100%  Weight:      Height:        Intake/Output Summary (Last 24 hours) at 05/07/2019 1013 Last data filed at 05/06/2019 2246 Gross per 24 hour  Intake 660 ml  Output --  Net 660 ml   Filed Weights   05/02/19 1031 05/03/19 0406 05/06/19 0500  Weight: 54.7 kg 57.1 kg 51.4 kg     Examination:  General exam: Appears calm and comfortable. Thin. Respiratory  system: Clear to auscultation. Respiratory effort normal. Cardiovascular system: S1 & S2 heard, RRR. No murmurs, rubs, gallops or clicks. Gastrointestinal system: Abdomen is nondistended, soft and tender. No organomegaly or masses felt. Normal bowel sounds heard. Central nervous system: Alert and oriented. No focal neurological deficits. Extremities: 2+ LE edema which is less in legs and more in thighs today. No calf tenderness Skin: No cyanosis. No rashes Psychiatry: Judgement and insight appear normal. Mood & affect appropriate.     Data Reviewed: I have personally reviewed following labs and imaging studies  CBC: Recent Labs  Lab 05/01/19 0949 05/03/19 0455 05/04/19 0241 05/05/19 0438 05/06/19 0321 05/07/19 0305  WBC 1.8* 4.4 6.7 9.1 8.5 6.1  NEUTROABS 1.5*  --   --   --   --   --   HGB 10.0* 8.0* 7.9* 7.8* 7.2* 7.0*  HCT 30.0* 23.7* 23.8* 23.4* 21.4* 21.4*  MCV 99.7 100.0 100.4* 101.3* 101.4* 100.0  PLT 167 136* 122* 98* 93* 99*   Basic Metabolic Panel: Recent Labs  Lab 05/02/19 0334 05/03/19 0455 05/04/19 0241 05/05/19 0438 05/06/19 0321 05/07/19 0305  NA 140 140 137  --  141 140  K 4.1 3.3* 3.3*  --  3.8 4.1  CL 117* 117* 116*  --  115* 114*  CO2 14* 15* 14*  --  14* 20*  GLUCOSE 89 283* 110*  --  98 72  BUN 13 16 18   --  22* 18  CREATININE 1.22* 1.12* 1.18*  --  1.16* 1.02*  CALCIUM 8.2* 7.9* 7.7*  --  8.1* 7.8*  MG 2.1 2.0 1.7 1.6* 2.3 1.9  PHOS 4.0 3.0 2.3* 2.3* 4.0 3.9   GFR: Estimated Creatinine Clearance: 46.3 mL/min (A) (by C-G formula based on SCr of 1.02 mg/dL (H)). Liver Function Tests: Recent Labs  Lab 05/01/19 0804 05/02/19 0334 05/03/19 0455 05/04/19 0241  AST 31 29 23 20   ALT 29 28 25 23   ALKPHOS 165* 138* 109 113  BILITOT 0.8 0.9 1.0 0.7  PROT 5.0* 4.9* 4.4* 4.2*  ALBUMIN 2.1* 1.9* 1.7* 1.6*   No results for input(s): LIPASE, AMYLASE in the last 168  hours. No results for input(s): AMMONIA in the last 168 hours. Coagulation Profile: Recent Labs  Lab 05/01/19 0949  INR 1.4*   Cardiac Enzymes: No results for input(s): CKTOTAL, CKMB, CKMBINDEX, TROPONINI in the last 168 hours. BNP (last 3 results) No results for input(s): PROBNP in the last 8760 hours. HbA1C: No results for input(s): HGBA1C in the last 72 hours. CBG: Recent Labs  Lab 05/06/19 2301 05/07/19 0336 05/07/19 0405 05/07/19 0752 05/07/19 0844  GLUCAP 94 59* 75 62* 106*   Lipid Profile: No results for input(s): CHOL, HDL, LDLCALC, TRIG, CHOLHDL, LDLDIRECT in the last 72 hours. Thyroid Function Tests: No results for input(s): TSH, T4TOTAL, FREET4, T3FREE, THYROIDAB in the last 72 hours. Anemia Panel: No results for input(s): VITAMINB12, FOLATE, FERRITIN, TIBC, IRON, RETICCTPCT in the last 72 hours. Sepsis Labs: Recent Labs  Lab 05/02/19 0334 05/03/19 0455  PROCALCITON 1.87 0.99    Recent Results (from the past 240 hour(s))  Blood culture (routine x 2)     Status: None   Collection Time: 04/29/19  4:20 PM   Specimen: Right Antecubital; Blood  Result Value Ref Range Status   Specimen Description RIGHT ANTECUBITAL  Final   Special Requests   Final    BOTTLES DRAWN AEROBIC AND ANAEROBIC Blood Culture adequate volume   Culture   Final  NO GROWTH 5 DAYS Performed at [facility], [place] [address] [place], [place] [address]    Report Status 05/04/2019 FINAL  Final  Culture, Urine     Status: None   Collection Time: 04/29/19  5:15 PM   Specimen: Urine, Random  Result Value Ref Range Status   Specimen Description URINE, RANDOM  Final   Special Requests NONE  Final   Culture   Final    NO GROWTH Performed at [facility], [place] [address], [place], [place] [address]    Report Status 05/01/2019 FINAL  Final  SARS CORONAVIRUS 2 (TAT 6-24 HRS) Nasopharyngeal Nasopharyngeal Swab     Status: None   Collection Time: 04/29/19  8:23 PM   Specimen:  Nasopharyngeal Swab  Result Value Ref Range Status   SARS Coronavirus 2 NEGATIVE NEGATIVE Final    Comment: (NOTE) SARS-CoV-2 target nucleic acids are NOT DETECTED. The SARS-CoV-2 RNA is generally detectable in upper and lower respiratory specimens during the acute phase of infection. Negative results do not preclude SARS-CoV-2 infection, do not rule out co-infections with other pathogens, and should not be used as the sole basis for treatment or other patient management decisions. Negative results must be combined with clinical observations, patient history, and epidemiological information. The expected result is Negative. Fact Sheet for Patients: SugarRoll.be Fact Sheet for Healthcare Providers: https://www.woods-mathews.com/ This test is not yet approved or cleared by the [place] FDA and  has been authorized for detection and/or diagnosis of SARS-CoV-2 by FDA under an Emergency Use Authorization (EUA). This EUA will remain  in effect (meaning this test can be used) for the duration of the COVID-19 declaration under Section 56 4(b)(1) of the Act, 21 U.S.C. section 360bbb-3(b)(1), unless the authorization is terminated or revoked sooner. Performed at [facility], [place] [address], [place], [place] [address]   Blood culture (routine x 2)     Status: None   Collection Time: 04/29/19 11:09 PM   Specimen: BLOOD  Result Value Ref Range Status   Specimen Description BLOOD LEFT ANTECUBITAL  Final   Special Requests   Final    BOTTLES DRAWN AEROBIC AND ANAEROBIC Blood Culture adequate volume   Culture   Final    NO GROWTH 5 DAYS Performed at [facility], [place] [address], [place], [place] [address]    Report Status 05/05/2019 FINAL  Final  MRSA PCR Screening     Status: None   Collection Time: 04/30/19  5:31 PM   Specimen: Nasopharyngeal  Result Value Ref Range Status   MRSA by PCR NEGATIVE NEGATIVE Final    Comment:         The GeneXpert MRSA Assay (FDA approved for NASAL specimens only), is one component of a comprehensive MRSA colonization surveillance program. It is not intended to diagnose MRSA infection nor to guide or monitor treatment for MRSA infections. Performed at [facility], [place] [address] [place], [place] [address]          Radiology Studies: No results found.      Scheduled Meds: . cholecalciferol  3,000 Units Per Tube Daily  . feeding supplement (OSMOLITE 1.5 CAL)  80-237 mL Per Tube 5 X Daily  . folic acid  1 mg Intravenous Daily  . free water  40 mL Per Tube 5 X Daily  . influenza vac split quadrivalent PF  0.5 mL Intramuscular Tomorrow-1000  . lisinopril-hydrochlorothiazide  1 tablet Oral Daily  . methocarbamol  500 mg Oral TID  . OLANZapine  2.5 mg Oral Daily  . oxyCODONE  10 mg Oral Q12H  . pantoprazole  40 mg Oral QHS  . QUEtiapine  50 mg Oral QHS  . thiamine injection  100 mg Intravenous Daily   Continuous Infusions: . lactated ringers Stopped (05/02/19 1216)  . lactated ringers 500 mL/hr at 05/04/19 1347     LOS: 7 days     Cordelia Poche, MD [facility] 05/07/2019, 10:13 AM  If 7PM-7AM, please contact night-coverage [email or web address]

## 2019-05-07 NOTE — Progress Notes (Signed)
Notified MD provider [place], R about low CBG 33 at 1645. Patient has has frequent low CBGs. Patient given orange juice and recheck CBG is 107. Patient remains alert and oriented with no symptoms. No new orders received from MD [facility] he is aware of CBG recheck in normal range. Will continue to monitor and assess patient.

## 2019-05-08 ENCOUNTER — Inpatient Hospital Stay (HOSPITAL_COMMUNITY): Payer: Medicare Other

## 2019-05-08 LAB — HEPATIC FUNCTION PANEL
ALT: 33 U/L (ref 0–44)
AST: 36 U/L (ref 15–41)
Albumin: 1.8 g/dL — ABNORMAL LOW (ref 3.5–5.0)
Alkaline Phosphatase: 132 U/L — ABNORMAL HIGH (ref 38–126)
Bilirubin, Direct: 0.3 mg/dL — ABNORMAL HIGH (ref 0.0–0.2)
Indirect Bilirubin: 0.4 mg/dL (ref 0.3–0.9)
Total Bilirubin: 0.7 mg/dL (ref 0.3–1.2)
Total Protein: 4.6 g/dL — ABNORMAL LOW (ref 6.5–8.1)

## 2019-05-08 LAB — CBC
HCT: 18.5 % — ABNORMAL LOW (ref 36.0–46.0)
Hemoglobin: 6.2 g/dL — CL (ref 12.0–15.0)
MCH: 34.4 pg — ABNORMAL HIGH (ref 26.0–34.0)
MCHC: 33.5 g/dL (ref 30.0–36.0)
MCV: 102.8 fL — ABNORMAL HIGH (ref 80.0–100.0)
Platelets: 83 10*3/uL — ABNORMAL LOW (ref 150–400)
RBC: 1.8 MIL/uL — ABNORMAL LOW (ref 3.87–5.11)
RDW: 17.2 % — ABNORMAL HIGH (ref 11.5–15.5)
WBC: 5 10*3/uL (ref 4.0–10.5)
nRBC: 0 % (ref 0.0–0.2)

## 2019-05-08 LAB — BASIC METABOLIC PANEL
Anion gap: 6 (ref 5–15)
BUN: 12 mg/dL (ref 6–20)
CO2: 22 mmol/L (ref 22–32)
Calcium: 7.8 mg/dL — ABNORMAL LOW (ref 8.9–10.3)
Chloride: 110 mmol/L (ref 98–111)
Creatinine, Ser: 0.77 mg/dL (ref 0.44–1.00)
GFR calc Af Amer: >60 mL/min (ref >60)
GFR calc non Af Amer: >60 mL/min (ref >60)
Glucose, Bld: 64 mg/dL — ABNORMAL LOW (ref 70–99)
Potassium: 4.3 mmol/L (ref 3.5–5.1)
Sodium: 138 mmol/L (ref 135–145)

## 2019-05-08 LAB — GLUCOSE, CAPILLARY
Glucose-Capillary: 109 mg/dL — ABNORMAL HIGH (ref 70–99)
Glucose-Capillary: 111 mg/dL — ABNORMAL HIGH (ref 70–99)
Glucose-Capillary: 116 mg/dL — ABNORMAL HIGH (ref 70–99)
Glucose-Capillary: 133 mg/dL — ABNORMAL HIGH (ref 70–99)
Glucose-Capillary: 59 mg/dL — ABNORMAL LOW (ref 70–99)
Glucose-Capillary: 71 mg/dL (ref 70–99)
Glucose-Capillary: 74 mg/dL (ref 70–99)

## 2019-05-08 LAB — RETICULOCYTES
Immature Retic Fract: 10.3 % (ref 2.3–15.9)
RBC.: 2.53 MIL/uL — ABNORMAL LOW (ref 3.87–5.11)
Retic Count, Absolute: 26.8 10*3/uL (ref 19.0–186.0)
Retic Ct Pct: 1.1 % (ref 0.4–3.1)

## 2019-05-08 LAB — PHOSPHORUS: Phosphorus: 2.9 mg/dL (ref 2.5–4.6)

## 2019-05-08 LAB — VITAMIN C: Vitamin C: 0.3 mg/dL — ABNORMAL LOW (ref 0.4–2.0)

## 2019-05-08 LAB — PREPARE RBC (CROSSMATCH)

## 2019-05-08 LAB — MAGNESIUM: Magnesium: 1.7 mg/dL (ref 1.7–2.4)

## 2019-05-08 LAB — LACTATE DEHYDROGENASE: LDH: 197 U/L — ABNORMAL HIGH (ref 98–192)

## 2019-05-08 MED ORDER — PREGABALIN 25 MG PO CAPS
25.0000 mg | ORAL_CAPSULE | Freq: Two times a day (BID) | ORAL | Status: DC
  Administered 2019-05-08 – 2019-05-10 (×5): 25 mg via ORAL
  Filled 2019-05-08 (×5): qty 1

## 2019-05-08 MED ORDER — FOLIC ACID 1 MG PO TABS
1.0000 mg | ORAL_TABLET | Freq: Every day | ORAL | Status: DC
  Administered 2019-05-09 – 2019-05-11 (×3): 1 mg
  Filled 2019-05-08 (×3): qty 1

## 2019-05-08 MED ORDER — THIAMINE HCL 100 MG PO TABS
100.0000 mg | ORAL_TABLET | Freq: Every day | ORAL | Status: DC
  Administered 2019-05-09 – 2019-05-11 (×3): 100 mg
  Filled 2019-05-08 (×3): qty 1

## 2019-05-08 MED ORDER — ASCORBIC ACID 500 MG PO TABS
500.0000 mg | ORAL_TABLET | Freq: Two times a day (BID) | ORAL | Status: DC
  Administered 2019-05-08 – 2019-05-11 (×6): 500 mg
  Filled 2019-05-08 (×6): qty 1

## 2019-05-08 MED ORDER — LISINOPRIL 20 MG PO TABS
20.0000 mg | ORAL_TABLET | Freq: Every day | ORAL | Status: DC
  Administered 2019-05-09: 20 mg via ORAL
  Filled 2019-05-08 (×3): qty 1

## 2019-05-08 MED ORDER — HYDROCHLOROTHIAZIDE 12.5 MG PO CAPS
12.5000 mg | ORAL_CAPSULE | Freq: Every day | ORAL | Status: DC
  Administered 2019-05-10 – 2019-05-11 (×2): 12.5 mg via ORAL
  Filled 2019-05-08 (×3): qty 1

## 2019-05-08 MED ORDER — SODIUM CHLORIDE 0.9% IV SOLUTION: Freq: Once | INTRAVENOUS | Status: AC

## 2019-05-08 NOTE — TOC Progression Note (Signed)
Transition of Care [facility]) - Progression Note    Patient Details  Name: Angie [name] MRN: [id number] Date of Birth: [birth year]  [facility] CM/SW Contact  Pollie Friar, RN Phone Number: 05/08/2019, 11:17 AM  Clinical Narrative:    TOC waiting on finalization of tube feedings needed at home. Once we know exactly what tube feedings patient will be on at home we can get these delivered to the home. TOC following.    Expected Discharge Plan: [place] Barriers to Discharge: Continued Medical Work up  Expected Discharge Plan and Services Expected Discharge Plan: [place]   Discharge Planning Services: CM Consult Post Acute Care Choice: Durable Medical Equipment, Home Health                   DME Arranged: Tube feeding DME Agency: [employer] Date DME Agency Contacted: 05/07/19 Time DME Agency Contacted: [phone] Representative spoke with at DME Agency: [place]: RN [place] Agency: [place] ([place]) Date [place]: 05/07/19 Time [place]: 1103 Representative spoke with at [place]: [place] ([place]) Interventions    Readmission Risk Interventions No flowsheet data found.

## 2019-05-08 NOTE — Evaluation (Signed)
Occupational Therapy Evaluation Patient Details Name: Angie [name] MRN: [id number] DOB: [birth year] Today's Date: 05/08/2019    History of Present Illness Angie [name] is a 54 y.o. female with history of gastric bypass surgery prior to which she had a history of diabetes mellitus CKD stage III, chronic anemia, chronic malnutrition with history of esophageal strictures, bipolar disorder and chronic pain, chronic anemia. Patient admitted for unresponsive episode secondary to hypoglycemia in the setting of failure to thrive.   Clinical Impression   PTA patient reports independent with ADLs, IADLs; increased assist required recently since decline.  Admitted for above and limited by problem list below, including impaired balance, decreased activity tolerance, and generalized weakness.  Pt demonstrating ability to complete toilet transfers with min guard assist, toileting with min guard assist, LB ADls with min assist and UB ADls with setup assist.  She fatigues easily, but VSS on RA today.  She will benefit from continued OT services while admitted and after dc at [facility] level in order to optimize independence, safety and return to PLOF with ADls, IADLs and mobility.     Follow Up Recommendations  Home health OT;Supervision - Intermittent    Equipment Recommendations  3 in 1 bedside commode    Recommendations for Other Services       Precautions / Restrictions Precautions Precautions: Fall Restrictions Weight Bearing Restrictions: No      Mobility Bed Mobility Overal bed mobility: Needs Assistance Bed Mobility: Supine to Sit;Sit to Supine     Supine to sit: Min guard Sit to supine: Min guard   General bed mobility comments: min guard for safety, line mgmt--increased time but pt able to manage LEs off bed using UES   Transfers Overall transfer level: Needs assistance Equipment used: Rolling walker (2 wheeled);None Transfers: Sit to/from [employer] to  Stand: Min guard Stand pivot transfers: Min guard       General transfer comment: min guard for safety and balance, cueing for hand placement     Balance Overall balance assessment: Needs assistance Sitting-balance support: No upper extremity supported;Feet supported Sitting balance-Leahy Scale: Fair     Standing balance support: Bilateral upper extremity supported;During functional activity Standing balance-Leahy Scale: Fair Standing balance comment: reliant on BUE support dynamically                           ADL either performed or assessed with clinical judgement   ADL Overall ADL's : Needs assistance/impaired     Grooming: Set up;Sitting   Upper Body Bathing: Set up;Sitting   Lower Body Bathing: Minimal assistance;Sit to/from stand   Upper Body Dressing : Set up;Sitting   Lower Body Dressing: Minimal assistance;Sit to/from stand   Toilet Transfer: Min guard;Stand-pivot;BSC;RW   Toileting- Water quality scientist and Hygiene: Min guard;Sit to/from stand       Functional mobility during ADLs: Min guard;Rolling walker General ADL Comments: pt limited by weakness, impaired balance      Vision   Vision Assessment?: No apparent visual deficits     Perception     Praxis      Pertinent Vitals/Pain Pain Assessment: Faces Faces Pain Scale: Hurts a little bit Pain Location: abdominal cramping at tube insertion site Pain Descriptors / Indicators: Cramping Pain Intervention(s): Monitored during session;Repositioned;Limited activity within patient's tolerance     Hand Dominance Right   Extremity/Trunk Assessment Upper Extremity Assessment Upper Extremity Assessment: Generalized weakness   Lower Extremity Assessment Lower Extremity Assessment: Defer  to PT evaluation       Communication Communication Communication: No difficulties   Cognition Arousal/Alertness: Awake/alert Behavior During Therapy: WFL for tasks assessed/performed Overall  Cognitive Status: Within Functional Limits for tasks assessed                                     General Comments  VSS on RA     Exercises     Shoulder Instructions      Home Living Family/patient expects to be discharged to:: Private residence Living Arrangements: Spouse/significant other Available Help at Discharge: Family;Available PRN/intermittently(nearly 24 hours ) Type of Home: House Home Access: Stairs to enter [employer] of Steps: 5 Entrance Stairs-Rails: Right;Left Home Layout: Two level;Able to live on main level with bedroom/bathroom     Bathroom Shower/Tub: Occupational psychologist: Standard     Home Equipment: None   Additional Comments: pt lives with her fiance and 51 yo son. Fiance does not work. Her older children help out too if needed      Prior Functioning/Environment Level of Independence: Needs assistance  Gait / Transfers Assistance Needed: needed help getting off toilet and in and out of car, wasn't walking much ADL's / Homemaking Assistance Needed: reports independent with ADLs, IADLs but recently needing some assist             OT Problem List: Decreased strength;Decreased activity tolerance;Impaired balance (sitting and/or standing);Decreased knowledge of use of DME or AE;Decreased knowledge of precautions      OT Treatment/Interventions: Self-care/ADL training;Therapeutic exercise;DME and/or AE instruction;Therapeutic activities;Patient/family education;Balance training;Energy conservation    OT Goals(Current goals can be found in the care plan section) Acute Rehab OT Goals Patient Stated Goal: return home OT Goal Formulation: With patient Time For Goal Achievement: 05/22/19 Potential to Achieve Goals: Good  OT Frequency: Min 2X/week   Barriers to D/C:            Co-evaluation PT/OT/SLP Co-Evaluation/Treatment: Yes Reason for Co-Treatment: To address functional/ADL transfers;Other  (comment)(decreased activity tolerance)   OT goals addressed during session: ADL's and self-care      AM-PAC OT "6 Clicks" Daily Activity     Outcome Measure Help from another person eating meals?: None Help from another person taking care of personal grooming?: A Little Help from another person toileting, which includes using toliet, bedpan, or urinal?: A Little Help from another person bathing (including washing, rinsing, drying)?: A Little Help from another person to put on and taking off regular upper body clothing?: A Little Help from another person to put on and taking off regular lower body clothing?: A Little 6 Click Score: 19   End of Session Equipment Utilized During Treatment: Rolling walker Nurse Communication: Mobility status  Activity Tolerance: Patient tolerated treatment well Patient left: in bed;with call bell/phone within reach  OT Visit Diagnosis: Other abnormalities of gait and mobility (R26.89);Muscle weakness (generalized) (M62.81)                Time: [id number] OT Time Calculation (min): 30 min Charges:  OT General Charges $OT Visit: 1 Visit OT Evaluation $OT Eval Moderate Complexity: 1 Mod  Jolaine Artist, OT Acute Rehabilitation Services Pager [phone] Office [phone]   Delight Stare 05/08/2019, 3:17 PM

## 2019-05-08 NOTE — Progress Notes (Signed)
Blood bank called, blood ready at this time. Pt does not have IV access, her existing IV leaking at the site. IV team unable to establish new access, has worked with pt for 50 minutes.  IV team will send day shift staff to attempt to establish IV access. Pt asymptomatic with anemia, denied SOB, chest pain and dizziness.

## 2019-05-08 NOTE — Progress Notes (Addendum)
PROGRESS NOTE    Angie [name]  [id number] DOB: 1965-10-20 DOA: 04/29/2019 PCP: Harvie Junior, MD   Brief Narrative: Angie [name] is a 54 y.o. female with history of gastric bypass surgery prior to which she had a history of diabetes mellitus CKD stage III, chronic anemia, chronic malnutrition with history of esophageal strictures, bipolar disorder and chronic pain, chronic anemia. Patient admitted for unresponsive episode secondary to hypoglycemia in the setting of failure to thrive. 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 Patient has gastrostomy tube with tube feeds.   Assessment & Plan:   Principal Problem:   Hypoglycemia Active Problems:   S/P gastric bypass   Intractable nausea and vomiting   Biliary anastomotic occlusion   Loss of weight   Hypothermia   Nausea & vomiting   Failure to thrive in adult   History of gastric bypass   Postprandial vomiting Dysphagia Failure to thrive Patient has a history of Roux-en-Y gastric bypass. EGD performed on 1/6 and was significant for tiny gastric remnant. Concern is gastric remnant is too small to allow for proper nutritional absorption. Gastrostomy tube placed by general surgery. -General surgery recommendations: Tube feeds -Monitor thiamine, zinc, copper, vitamins A, D, E, K  Electrolyte abnormalities Patient has had hyponatremia, hypomagnesemia, hypophosphatemia, hypokalemia. -Treat as needed  Hypothermia Likely secondary malnourishment. No evidence of infection. Resolved.  Severe hepatic steatosis Noted on MRCP.  Oxygen use Patient had an episode of dyspnea on 1/11 and oxygen started. No dyspnea currently. -Wean to room air; if unable, will obtain a chest x-ray  Distended gallbladder Sludge noted on abdominal ultrasound with no gallbladder wall thickening. Associated biliary duct dilation with no mass or calculus seen. MRI significant for common bile duct up to 7 mm.  No abdominal pain. AST/ALT/Alkaline phosphatase normal  Bilateral hydronephrosis, moderate Hydroureter Noted on MRI. Creatinine stable. -Watch  Macrocytic anemia Low folate. Normal/high vitamin B12. Hemoglobin trending down after most recent transfusion on 04/30/2019. She has received 1 unis of PRBC to date. Iron studies suggest adequate iron supplementation. FOBT negative. Hemoglobin trended down to 6.2. 1 unit of PRBC ordered by primary. Unsure why she has precipitous drop -Continue folate/thiamine supplementation -Daily CBC -LDH, haptoglobin, hepatic function panel, reticulocyte count, path review, FOBT  Thrombocytopenia Unknown etiology. Workup above.  Hypoglycemia Recurrent. Asymptomatic -Continue to watch blood sugar  Essential hypertension Patient is on lisinopril-hydrochlorothiazide as an outpatient -Cotninue lisinopril-hydrochlorothiazide; increase to lisinopril 20 mg daily  Bilateral LE edema Anasarca Likely secondary to poor albumin; last albumin of 1.6. Edema improved in lower legs after elevation -Elevate legs -Nutrition  Leukopenia Resolved.  CKD stage III Stable.  Liver lesion Complex cystic lesion of left lobe.  Generalized weakness Likely secondary to failure to thrive. Spine imaging significant for mild lower lumbar spondylosis. No evidence of central canal/foraminal narrowing on thoracic/lumbar MRI -PT ordered and recommendations pending  Bipolar disorder Anxiety -Continue Seroquel, Xanax  Chronic pain syndrome -Continue oxycodone  Poor IV access -PICC line placed   DVT prophylaxis: SCDs Code Status:   Code Status: Full Code Family Communication: Fiance at bedside Disposition Plan: Discharge per primary    Procedures:   04/30/2019: Transthoracic Echocardiogram IMPRESSIONS    1. Left ventricular ejection fraction, by visual estimation, is 50 to 55%. The left ventricle has normal function. There is no left ventricular hypertrophy.  2.  The left ventricle has no regional wall motion abnormalities.  3. Global right ventricle has normal systolic function.The right ventricular  size is normal. No increase in right ventricular wall thickness.  4. Left atrial size was normal.  5. Right atrial size was normal.  6. The mitral valve is normal in structure. No evidence of mitral valve regurgitation.  7. The tricuspid valve is normal in structure.  8. The aortic valve is normal in structure. Aortic valve regurgitation is not visualized.  9. The pulmonic valve was normal in structure. Pulmonic valve regurgitation is not visualized. 10. Normal pulmonary artery systolic pressure. 11. The atrial septum is grossly normal.   05/02/2019: EGD Impression:               1. Status post Roux-en-Y gastric bypass surgery. No                            stricture or stenosis                           2. Tiny gastric remnant. I wonder if gastric volume                            is so small as to explain her issues with eating                            difficulties, weight loss, and metabolic                            abnormalities. Recommendation:           1. Soft diet                           2. Upper GI series to evaluate gastric remnant size                           3. Ask bariatric surgeons to weigh in on her                            current anatomy to see if she would benefit from                            reversal of her surgery. This could likely be done                            endoscopically.                           4. We will follow   05/04/2019: Laparoscopic gastrostomy tube insertion  Antimicrobials:  Vancomycin  Cefepime    Subjective: No issues today. Had hypoglycemia overnight without symptoms  Objective: Vitals:   05/08/19 1011 05/08/19 1026 05/08/19 1115 05/08/19 1330  BP: (!) 137/92 (!) 124/101 (!) 136/105 (!) 152/101  Pulse: 96 98 84 99  Resp: (!) 21 16 16 20   Temp: 97.8 F (36.6 C) 98.4 F (36.9 C)  98 F (36.7 C) 98.1 F (36.7 C)  TempSrc: Oral  Oral Oral  SpO2:   100% 94%  Weight:      Height:  Intake/Output Summary (Last 24 hours) at 05/08/2019 1426 Last data filed at 05/08/2019 1330 Gross per 24 hour  Intake 490 ml  Output 300 ml  Net 190 ml   Filed Weights   05/03/19 0406 05/06/19 0500 05/08/19 0500  Weight: 57.1 kg 51.4 kg 51 kg    Examination:  General exam: Appears calm and comfortable. Thin. Respiratory system: Clear to auscultation. Respiratory effort normal. Cardiovascular system: S1 & S2 heard, RRR. No murmurs, rubs, gallops or clicks. Gastrointestinal system: Abdomen is distended, soft and mildly tender. Normal bowel sounds heard. Central nervous system: Alert and oriented. No focal neurological deficits. Extremities: LE edema. No calf tenderness Skin: No cyanosis. No rashes Psychiatry: Judgement and insight appear normal. Mood & affect appropriate.     Data Reviewed: I have personally reviewed following labs and imaging studies  CBC: Recent Labs  Lab 05/04/19 0241 05/05/19 0438 05/06/19 0321 05/07/19 0305 05/08/19 0238  WBC 6.7 9.1 8.5 6.1 5.0  HGB 7.9* 7.8* 7.2* 7.0* 6.2*  HCT 23.8* 23.4* 21.4* 21.4* 18.5*  MCV 100.4* 101.3* 101.4* 100.0 102.8*  PLT 122* 98* 93* 99* 83*   Basic Metabolic Panel: Recent Labs  Lab 05/02/19 0334 05/03/19 0455 05/04/19 0241 05/05/19 0438 05/06/19 0321 05/07/19 0305 05/08/19 0238  NA 140 140 137  --  141 140  --   K 4.1 3.3* 3.3*  --  3.8 4.1  --   CL 117* 117* 116*  --  115* 114*  --   CO2 14* 15* 14*  --  14* 20*  --   GLUCOSE 89 283* 110*  --  98 72  --   BUN 13 16 18   --  22* 18  --   CREATININE 1.22* 1.12* 1.18*  --  1.16* 1.02*  --   CALCIUM 8.2* 7.9* 7.7*  --  8.1* 7.8*  --   MG 2.1 2.0 1.7 1.6* 2.3 1.9 1.7  PHOS 4.0 3.0 2.3* 2.3* 4.0 3.9 2.9   GFR: Estimated Creatinine Clearance: 46.3 mL/min (A) (by C-G formula based on SCr of 1.02 mg/dL (H)). Liver Function Tests: Recent Labs  Lab  05/02/19 0334 05/03/19 0455 05/04/19 0241  AST 29 23 20   ALT 28 25 23   ALKPHOS 138* 109 113  BILITOT 0.9 1.0 0.7  PROT 4.9* 4.4* 4.2*  ALBUMIN 1.9* 1.7* 1.6*   No results for input(s): LIPASE, AMYLASE in the last 168 hours. No results for input(s): AMMONIA in the last 168 hours. Coagulation Profile: No results for input(s): INR, PROTIME in the last 168 hours. Cardiac Enzymes: No results for input(s): CKTOTAL, CKMB, CKMBINDEX, TROPONINI in the last 168 hours. BNP (last 3 results) No results for input(s): PROBNP in the last 8760 hours. HbA1C: No results for input(s): HGBA1C in the last 72 hours. CBG: Recent Labs  Lab 05/08/19 0408 05/08/19 0433 05/08/19 0723 05/08/19 0927 05/08/19 1115  GLUCAP 59* 109* 71 74 111*   Lipid Profile: No results for input(s): CHOL, HDL, LDLCALC, TRIG, CHOLHDL, LDLDIRECT in the last 72 hours. Thyroid Function Tests: No results for input(s): TSH, T4TOTAL, FREET4, T3FREE, THYROIDAB in the last 72 hours. Anemia Panel: No results for input(s): VITAMINB12, FOLATE, FERRITIN, TIBC, IRON, RETICCTPCT in the last 72 hours. Sepsis Labs: Recent Labs  Lab 05/02/19 0334 05/03/19 0455  PROCALCITON 1.87 0.99    Recent Results (from the past 240 hour(s))  Blood culture (routine x 2)     Status: None   Collection Time: 04/29/19  4:20 PM   Specimen: Right  Antecubital; Blood  Result Value Ref Range Status   Specimen Description RIGHT ANTECUBITAL  Final   Special Requests   Final    BOTTLES DRAWN AEROBIC AND ANAEROBIC Blood Culture adequate volume   Culture   Final    NO GROWTH 5 DAYS Performed at [facility], [address], [place], [place] [address]    Report Status 05/04/2019 FINAL  Final  Culture, Urine     Status: None   Collection Time: 04/29/19  5:15 PM   Specimen: Urine, Random  Result Value Ref Range Status   Specimen Description URINE, RANDOM  Final   Special Requests NONE  Final   Culture   Final    NO GROWTH Performed at [facility], [place] [address] [place], [place] [address]    Report Status 05/01/2019 FINAL  Final  SARS CORONAVIRUS 2 (TAT 6-24 HRS) Nasopharyngeal Nasopharyngeal Swab     Status: None   Collection Time: 04/29/19  8:23 PM   Specimen: Nasopharyngeal Swab  Result Value Ref Range Status   SARS Coronavirus 2 NEGATIVE NEGATIVE Final    Comment: (NOTE) SARS-CoV-2 target nucleic acids are NOT DETECTED. The SARS-CoV-2 RNA is generally detectable in upper and lower respiratory specimens during the acute phase of infection. Negative results do not preclude SARS-CoV-2 infection, do not rule out co-infections with other pathogens, and should not be used as the sole basis for treatment or other patient management decisions. Negative results must be combined with clinical observations, patient history, and epidemiological information. The expected result is Negative. Fact Sheet for Patients: SugarRoll.be Fact Sheet for Healthcare Providers: https://www.woods-mathews.com/ This test is not yet approved or cleared by the [place] FDA and  has been authorized for detection and/or diagnosis of SARS-CoV-2 by FDA under an Emergency Use Authorization (EUA). This EUA will remain  in effect (meaning this test can be used) for the duration of the COVID-19 declaration under Section 56 4(b)(1) of the Act, 21 U.S.C. section 360bbb-3(b)(1), unless the authorization is terminated or revoked sooner. Performed at [facility], [place] [address] [place], [place] [address]   Blood culture (routine x 2)     Status: None   Collection Time: 04/29/19 11:09 PM   Specimen: BLOOD  Result Value Ref Range Status   Specimen Description BLOOD LEFT ANTECUBITAL  Final   Special Requests   Final    BOTTLES DRAWN AEROBIC AND ANAEROBIC Blood Culture adequate volume   Culture   Final    NO GROWTH 5 DAYS Performed at [facility], [place] [address] [place], [place]  [address]    Report Status 05/05/2019 FINAL  Final  MRSA PCR Screening     Status: None   Collection Time: 04/30/19  5:31 PM   Specimen: Nasopharyngeal  Result Value Ref Range Status   MRSA by PCR NEGATIVE NEGATIVE Final    Comment:        The GeneXpert MRSA Assay (FDA approved for NASAL specimens only), is one component of a comprehensive MRSA colonization surveillance program. It is not intended to diagnose MRSA infection nor to guide or monitor treatment for MRSA infections. Performed at [facility], [place] [address], [place], [place] [address]          Radiology Studies: DG ABDOMEN PEG TUBE LOCATION  Result Date: 05/08/2019 CLINICAL DATA:  Evaluate for leak. EXAM: ABDOMEN - 1 VIEW COMPARISON:  Upper GI 05/03/2019 FINDINGS: Water-soluble contrast material was injected into the indwelling  left upper quadrant gastrostomy tube. This results in opacification of the stomach lumen without extravasation to suggest leak. Enteric contrast material within the colon is noted which is likely from upper GI performed 05/03/2019 IMPRESSION: 1. Contrast injection of the indwelling gastrostomy tube opacifies the stomach without evidence for contrast extravasation to suggest leak. Electronically Signed   By: Kerby Moors M.D.   On: 05/08/2019 09:24        Scheduled Meds: . vitamin C  500 mg Per Tube BID  . cholecalciferol  3,000 Units Per Tube Daily  . feeding supplement (OSMOLITE 1.5 CAL)  80-237 mL Per Tube 5 X Daily  . [START ON [id number] folic acid  1 mg Per Tube Daily  . free water  40 mL Per Tube 5 X Daily  . [START ON 05/09/2019] lisinopril  20 mg Oral Daily   Or  . [START ON 05/09/2019] hydrochlorothiazide  12.5 mg Oral Daily  . influenza vac split quadrivalent PF  0.5 mL Intramuscular Tomorrow-1000  . methocarbamol  500 mg Oral TID  . multivitamin  1 tablet Oral Daily  . OLANZapine  2.5 mg Oral Daily  . oxyCODONE  10 mg Oral Q12H  . pantoprazole  40 mg Oral QHS  .  pregabalin  25 mg Oral BID  . QUEtiapine  50 mg Oral QHS  . [START ON 05/09/2019] thiamine  100 mg Per Tube Daily  . vitamin A  10,000 Units Per Tube Daily  . vitamin E  400 Units Per Tube Daily   Continuous Infusions: . lactated ringers Stopped (05/02/19 1216)  . lactated ringers Stopped (05/08/19 0710)     LOS: 8 days     Cordelia Poche, MD [facility] 05/08/2019, 2:26 PM  If 7PM-7AM, please contact night-coverage [email or web address]

## 2019-05-08 NOTE — Progress Notes (Signed)
Hypoglycemic Event  CBG: 59  Treatment: 4 oz juice/soda  Symptoms: None  Follow-up CBG: Time: 0433 CBG Result:109  Possible Reasons for Event: Inadequate meal intake and Unknown  Comments/MD notified:Yes    Angie [name] N Disa Riedlinger

## 2019-05-08 NOTE — Progress Notes (Signed)
CRITICAL VALUE ALERT  Critical Value:  Hemoglobin 6.2  Date & Time Notied:  05/08/19 at [place]  Provider Notified: Sharlet Salina, MD at [facility] on 05/08/19 Orders Received/Actions taken: New orders placed/Transfusion

## 2019-05-08 NOTE — Progress Notes (Signed)
Phlebotomy called and notified about new lab orders for type and screen. Will collect labs ASAP.

## 2019-05-08 NOTE — Progress Notes (Signed)
[facility] Progress Note  4 Days Post-Op  Subjective: CC: abdominal pain, leg pain Patient reports abdominal pain this AM. Also reports she threw up some mid-day meds yesterday. No further emesis. Having a lot of leaking around G-tube and feels like she hears stuff gurgling out every time she is getting TFs. She is having nerve pain in LEs. Blood pressure also elevated but she thinks this is likely secondary to pain. Patient reports she worked with PT yesterday but was not able to lift her leg without using UEs to move it. Patient also reports 2 episodes of low blood sugar yesterday.   Objective: Vital signs in last 24 hours: Temp:  [97.4 F (36.3 C)-98.5 F (36.9 C)] 98.5 F (36.9 C) (01/12 0720) Pulse Rate:  [78-106] 106 (01/12 0720) Resp:  [10-19] 10 (01/12 0720) BP: (125-155)/(87-106) 149/106 (01/12 0720) SpO2:  [67 %-100 %] 100 % (01/12 0720) Weight:  [51 kg] 51 kg (01/12 0500) Last BM Date: 05/06/19  Intake/Output from previous day: 01/11 0701 - 01/12 0700 In: -  Out: 300 [Urine:300] Intake/Output this shift: No intake/output data recorded.  PE: Gen:  Alert, NAD, pleasant Card:  Regular rate and rhythm Pulm:  Normal effort, clear to auscultation bilaterally Abd: Soft, ND,port sites with glue intact are well appearing and are without drainage or signs of infection. G tube sitewith drainage that appears to be TF's and flushes. Skin surrounding site is without signs of infection. Generalized TTP without guarding. No peritonitis. Skin: warm and dry, no rashes  Psych: A&Ox3   Lab Results:  Recent Labs    05/07/19 0305 05/08/19 0238  WBC 6.1 5.0  HGB 7.0* 6.2*  HCT 21.4* 18.5*  PLT 99* 83*   BMET Recent Labs    05/06/19 0321 05/07/19 0305  NA 141 140  K 3.8 4.1  CL 115* 114*  CO2 14* 20*  GLUCOSE 98 72  BUN 22* 18  CREATININE 1.16* 1.02*  CALCIUM 8.1* 7.8*   PT/INR No results for input(s): LABPROT, INR in the last 72 hours. CMP      Component Value Date/Time   NA 140 05/07/2019 0305   K 4.1 05/07/2019 0305   CL 114 (H) 05/07/2019 0305   CO2 20 (L) 05/07/2019 0305   GLUCOSE 72 05/07/2019 0305   BUN 18 05/07/2019 0305   CREATININE 1.02 (H) 05/07/2019 0305   CALCIUM 7.8 (L) 05/07/2019 0305   PROT 4.2 (L) 05/04/2019 0241   ALBUMIN 1.6 (L) 05/04/2019 0241   AST 20 05/04/2019 0241   ALT 23 05/04/2019 0241   ALKPHOS 113 05/04/2019 0241   BILITOT 0.7 05/04/2019 0241   GFRNONAA >60 05/07/2019 0305   GFRAA >60 05/07/2019 0305   Lipase     Component Value Date/Time   LIPASE 13 04/29/2019 1650       Studies/Results: No results found.  Anti-infectives: Anti-infectives (From admission, onward)   Start     Dose/Rate Route Frequency Ordered Stop   05/04/19 1015  ceFAZolin (ANCEF) IVPB 2g/100 mL premix     2 g 200 mL/hr over 30 Minutes Intravenous To Short Stay 05/04/19 0958 05/05/19 1015   05/01/19 0300  vancomycin (VANCOREADY) IVPB 500 mg/100 mL  Status:  Discontinued     500 mg 100 mL/hr over 60 Minutes Intravenous Every 24 hours 04/30/19 0833 05/02/19 1348   04/30/19 2200  vancomycin (VANCOREADY) IVPB 500 mg/100 mL  Status:  Discontinued     500 mg 100 mL/hr over 60 Minutes Intravenous Every  24 hours 04/30/19 0103 04/30/19 0833   04/30/19 1000  ceFEPIme (MAXIPIME) 2 g in sodium chloride 0.9 % 100 mL IVPB  Status:  Discontinued     2 g 200 mL/hr over 30 Minutes Intravenous Every 12 hours 04/30/19 0103 05/04/19 1552   04/29/19 2300  vancomycin (VANCOCIN) IVPB 1000 mg/200 mL premix     1,000 mg 200 mL/hr over 60 Minutes Intravenous  Once 04/29/19 2247 04/30/19 0447   04/29/19 2300  ceFEPIme (MAXIPIME) 2 g in sodium chloride 0.9 % 100 mL IVPB     2 g 200 mL/hr over 30 Minutes Intravenous  Once 04/29/19 2247 04/30/19 0020       Assessment/Plan DM CKD HTN GERD Hx seizures  Chronic anemia  Hypoglycemia- patient with 2 episodes since yesterday, will discuss with attending and TRH *above per medicine  and appreciate their assistance*  FTT  Protein Calorie Malnutrition (pre-alb 9.9) Dysphagia andPostprandial emesis  - Patient with Hx of Roux-en-Y in 2007.The patient suffered from weight loss and malnutrition over the last several yearsafter struggling withstenosis related to ulcer at the gastrojejunostomy site requiring multiple endoscopies and dilations.She ultimately underwent alaparoscopic revision of gastrojejunostomy and insertion of gastrostomy tube to remnant stomachin 2019 by Dr. Kieth Brightly. G-tube has since been removed.  - EGD on 1/6 that showed no stricture or stenosis and a small gastric remnant.  - Upper GI1/7showed slow clearance of the esophagus with esophageal dysmotility, no stricture. This also showed small gastric pouch with immediate emptying into the small bowel.  - IR did note feel patients anatomy was amenable to percutaneous gastrostomy tube placement -s/p laparoscopic gastrostomy tube insertion, Dr. Quinn Axe, 01/08 - Tube feeds Macrocytic anemia/folate deficiency: -Patient received 1 unit PRBC for hemoglobin of 6.4 on 1/3, hgb responded to blood but has slowly drifted back down, likely consumptive - Hgb today 6.2, will be transfused 1 unit this AM with repeat h/h after transfusion   FEN -tube feeds and carb modified - hold TF VTE -SCDs ID -no antibiotics currently, afebrile, WBC5 Follow up: Dr. Kieth Brightly  Plan: hold TF this AM until position of G-tube checked. If G-tube in good position may resume TF later today. Transfuse 1 unit for consumptive anemia and recheck hgb later today. 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   Pt lives at home with her fiance and 6yo son  LOS: 8 days    Brigid Re , [facility] 05/08/2019, 7:39 AM Please see Amion for pager number during day hours 7:00am-4:30pm

## 2019-05-08 NOTE — Progress Notes (Signed)
Physical Therapy Treatment Patient Details Name: Angie [name] MRN: [id number] DOB: [birth year] Today's Date: 05/08/2019    History of Present Illness Angie [name] is a 54 y.o. female with history of gastric bypass surgery prior to which she had a history of diabetes mellitus CKD stage III, chronic anemia, chronic malnutrition with history of esophageal strictures, bipolar disorder and chronic pain, chronic anemia. Patient admitted for unresponsive episode secondary to hypoglycemia in the setting of failure to thrive.    PT Comments    Pt received blood earlier today and tolerated activity better than yesterday. Ambulated 150' with RW and min A with chair brought behind. Needed several standing rest breaks but did not need to sit. Pt stands with trunk flexed, vc's for more erect posture for back health but she is limited by abdominal discomfort. PT will continue to follow.    Follow Up Recommendations  Home health PT     Equipment Recommendations  Wheelchair (measurements PT);Rolling walker with 5" wheels    Recommendations for Other Services OT consult     Precautions / Restrictions Precautions Precautions: Fall Restrictions Weight Bearing Restrictions: No    Mobility  Bed Mobility Overal bed mobility: Needs Assistance Bed Mobility: Supine to Sit;Sit to Supine     Supine to sit: Min guard Sit to supine: Min guard   General bed mobility comments: increased time needed. Discussed option of pt using sheet to assist in LE mvmt  Transfers Overall transfer level: Needs assistance Equipment used: Rolling walker (2 wheeled);None Transfers: Sit to/from [employer] to Stand: Min guard Stand pivot transfers: Min guard       General transfer comment: pt able to transfer to [facility] without AD with min-guard A. Sit<>stand to RW with increased effort and cues for posture  Ambulation/Gait Ambulation/Gait assistance: Min assist Gait Distance (Feet): 150  Feet Assistive device: Rolling walker (2 wheeled) Gait Pattern/deviations: Step-through pattern;Decreased stride length;Trunk flexed Gait velocity: decreased Gait velocity interpretation: <1.8 ft/sec, indicate of risk for recurrent falls General Gait Details: 2/4 DOE today, HR low 100's. Took several standing rest breaks with cues to stand erect (flexes trunk due to abdominal discomfort)   Stairs             Wheelchair Mobility    Modified Rankin (Stroke Patients Only)       Balance Overall balance assessment: Needs assistance Sitting-balance support: No upper extremity supported;Feet supported Sitting balance-Leahy Scale: Fair     Standing balance support: During functional activity;No upper extremity supported Standing balance-Leahy Scale: Fair Standing balance comment: pt able to stand in static position without support but needs UE support for safety with mobility                            Cognition Arousal/Alertness: Awake/alert Behavior During Therapy: WFL for tasks assessed/performed Overall Cognitive Status: Within Functional Limits for tasks assessed                                        Exercises      General Comments General comments (skin integrity, edema, etc.): pt demonstrated exercises that we went over yesterday      Pertinent Vitals/Pain Pain Assessment: Faces Faces Pain Scale: Hurts a little bit Pain Location: abdominal cramping at tube insertion site Pain Descriptors / Indicators: Cramping Pain Intervention(s): Monitored during session;Premedicated before session  Home Living Family/patient expects to be discharged to:: Private residence Living Arrangements: Spouse/significant other Available Help at Discharge: Family;Available PRN/intermittently(nearly 24 hours ) Type of Home: House Home Access: Stairs to enter Entrance Stairs-Rails: Right;Left Home Layout: Two level;Able to live on main level with  bedroom/bathroom Home Equipment: None Additional Comments: pt lives with her fiance and 33 yo son. Fiance does not work. Her older children help out too if needed    Prior Function Level of Independence: Needs assistance  Gait / Transfers Assistance Needed: needed help getting off toilet and in and out of car, wasn't walking much ADL's / Homemaking Assistance Needed: reports independent with ADLs, IADLs but recently needing some assist      PT Goals (current goals can now be found in the care plan section) Acute Rehab PT Goals Patient Stated Goal: return home PT Goal Formulation: With patient Time For Goal Achievement: 05/21/19 Potential to Achieve Goals: Good Progress towards PT goals: Progressing toward goals    Frequency    Min 3X/week      PT Plan Current plan remains appropriate    Co-evaluation PT/OT/SLP Co-Evaluation/Treatment: Yes Reason for Co-Treatment: To address functional/ADL transfers;Other (comment)(decreased activity tolerance)   OT goals addressed during session: ADL's and self-care      AM-PAC PT "6 Clicks" Mobility   Outcome Measure  Help needed turning from your back to your side while in a flat bed without using bedrails?: A Little Help needed moving from lying on your back to sitting on the side of a flat bed without using bedrails?: A Little Help needed moving to and from a bed to a chair (including a wheelchair)?: A Little Help needed standing up from a chair using your arms (e.g., wheelchair or bedside chair)?: A Little Help needed to walk in hospital room?: A Little Help needed climbing 3-5 steps with a railing? : A Lot 6 Click Score: 17    End of Session   Activity Tolerance: Patient tolerated treatment well Patient left: with call bell/phone within reach;in bed Nurse Communication: Mobility status PT Visit Diagnosis: Unsteadiness on feet (R26.81);Muscle weakness (generalized) (M62.81)     Time: [id number] PT Time Calculation (min)  (ACUTE ONLY): 32 min  Charges:  $Gait Training: 8-22 mins                     Leighton Roach, [place]  Pager [phone] Office [place] 05/08/2019, 4:13 PM

## 2019-05-09 LAB — HAPTOGLOBIN: Haptoglobin: 61 mg/dL (ref 33–346)

## 2019-05-09 LAB — CBC
HCT: 26.6 % — ABNORMAL LOW (ref 36.0–46.0)
Hemoglobin: 8.9 g/dL — ABNORMAL LOW (ref 12.0–15.0)
MCH: 32.2 pg (ref 26.0–34.0)
MCHC: 33.5 g/dL (ref 30.0–36.0)
MCV: 96.4 fL (ref 80.0–100.0)
Platelets: 95 10*3/uL — ABNORMAL LOW (ref 150–400)
RBC: 2.76 MIL/uL — ABNORMAL LOW (ref 3.87–5.11)
RDW: 21.4 % — ABNORMAL HIGH (ref 11.5–15.5)
WBC: 6 10*3/uL (ref 4.0–10.5)
nRBC: 0 % (ref 0.0–0.2)

## 2019-05-09 LAB — GLUCOSE, CAPILLARY
Glucose-Capillary: 111 mg/dL — ABNORMAL HIGH (ref 70–99)
Glucose-Capillary: 125 mg/dL — ABNORMAL HIGH (ref 70–99)
Glucose-Capillary: 58 mg/dL — ABNORMAL LOW (ref 70–99)
Glucose-Capillary: 76 mg/dL (ref 70–99)
Glucose-Capillary: 94 mg/dL (ref 70–99)

## 2019-05-09 LAB — PREPARE RBC (CROSSMATCH)

## 2019-05-09 LAB — PHOSPHORUS: Phosphorus: 3.2 mg/dL (ref 2.5–4.6)

## 2019-05-09 LAB — MAGNESIUM: Magnesium: 1.7 mg/dL (ref 1.7–2.4)

## 2019-05-09 LAB — OCCULT BLOOD X 1 CARD TO LAB, STOOL: Fecal Occult Bld: POSITIVE — AB

## 2019-05-09 MED ORDER — FENTANYL CITRATE (PF) 100 MCG/2ML IJ SOLN
25.0000 ug | Freq: Once | INTRAMUSCULAR | Status: AC
  Administered 2019-05-09: 25 ug via INTRAVENOUS
  Filled 2019-05-09: qty 2

## 2019-05-09 MED ORDER — SODIUM CHLORIDE 0.9% IV SOLUTION: Freq: Once | INTRAVENOUS | Status: AC

## 2019-05-09 NOTE — Progress Notes (Signed)
BP continues elevated. Blood infusing at 155mL/hr. Pain down to 3/10. NP updated.

## 2019-05-09 NOTE — Progress Notes (Signed)
PROGRESS NOTE    Angie [name]  [id number] DOB: December 31, 1965 DOA: 04/29/2019 PCP: Harvie Junior, MD   Brief Narrative:  Angie [name] is a 54 y.o. female with history of gastric bypass surgery, history of diabetes mellitus, CKD stage III, chronic anemia, chronic malnutrition with history of esophageal strictures, bipolar disorder and chronic pain, chronic anemia. Patient was admitted for unresponsive episode secondary to hypoglycemia in the setting of failure to thrive. 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 Patient has gastrostomy tube and is receiving tube feeds. Medical team was consulted for medical management.   Assessment & Plan:   Principal Problem:   Hypoglycemia Active Problems:   S/P gastric bypass   Intractable nausea and vomiting   Biliary anastomotic occlusion   Loss of weight   Hypothermia   Nausea & vomiting   Failure to thrive in adult   History of gastric bypass   Postprandial vomiting/Dysphagia/Failure to thrive History of Roux-en-Y gastric bypass. EGD performed on 1/6 and was significant for tiny gastric remnant. Concern is gastric remnant is too small to allow for proper nutritional absorption so gastrostomy tube was placed by general surgery.  No leak noted.  Currently on tube feeds and oral diet.  hyponatremia, hypomagnesemia, hypophosphatemia, hypokalemia. We will continue to replenish as necessary.  Will closely monitor.  Latest magnesium of 1.7 phosphorus of 3.2, potassium of 4.3  Hypothermia Resolved.  Severe hepatic steatosis Noted on MRCP.  Will need to monitor as outpatient.  Distended gallbladder Abdominal ultrasound showed some sludge without gallbladder wall thickening, biliary duct dilation with no mass or calculus seen. MRI significant for common bile duct up to 7 mm.  Rest of the LFTs within normal limits.  No abdominal pain.  Surgery on board.  Bilateral hydronephrosis, moderate Hydroureter Resolved.   Noted on initial MRI. Creatinine stable.  Renal ultrasound on 05/02/2018 which showed no evidence of hydronephrosis.  Macrocytic anemia/thrombocytopenia Low folate. Normal/high vitamin B12.  Continue with blood transfusion of 1 unit on 04/29/2018.  Hemoglobin trended down to 6.2. PRBC ordered by primary team.  Hemoglobin today is 8.9. Continue folate/thiamine.  CBC daily.  Haptoglobin within normal limits.  Fecal occult was positive.  LDH was mildly elevated.  No reticulocytosis.  Continue vitamin C supplements.  Thrombocytopenia No evidence of external bleeding.  Closely monitor.  Could be a component of macrocytic anemia  Hypoglycemia Improving.  Recurrent and asymptomatic.  On tube feeding.  Blood glucose level getting better.  Essential hypertension Continue lisinopril HCTZ.  Closely monitor electrolytes.  Check labs in a.m.  Bilateral LE edema/Anasarca Due to hypoalbuminemia and poor nutrition..  Continue nutritional improvement.   Leukopenia Resolved.  CKD stage III Stable.  Check BMP in a.m.  Latest creatinine of 0.7.  Liver lesion Complex cystic lesion of left lobe.  Will need outpatient follow-up.  Generalized weakness Likely secondary to failure to thrive.  Continue physical therapy.  PT recommends home health PT on discharge.  Bipolar disorder/Anxiety -Continue Seroquel, Xanax.  Controlled at this time.  Chronic pain syndrome, peripheral neuropathy -Continue oxycodone, continue Lyrica   DVT prophylaxis: SCD  Code Status: Full code  Family Communication:   Spoke with the patient at bedside.  Disposition Plan: Discharge plan as per primary team.  Physical therapy recommends home health PT on discharge with wheelchair and rolling walker.   Procedures:    05/02/2019: EGD Impression:               1. Status  post Roux-en-Y gastric bypass surgery. No                            stricture or stenosis                           2. Tiny gastric remnant. I wonder if  gastric volume                            is so small as to explain her issues with eating                            difficulties, weight loss, and metabolic                            abnormalities. Recommendation:           1. Soft diet                           2. Upper GI series to evaluate gastric remnant size                           3. Ask bariatric surgeons to weigh in on her                            current anatomy to see if she would benefit from                            reversal of her surgery. This could likely be done                            endoscopically.                           4. We will follow   05/04/2019: Laparoscopic gastrostomy tube insertion  PRBC transfusion  Antimicrobials: None  Subjective: Patient denies any nausea, vomiting or abdominal pain today.  Complains of burning sensation of her feet.  Denies  shortness of breath cough fever.  Has been having bowel movements which is regular.    Objective: Vitals:   05/09/19 0506 05/09/19 0657 05/09/19 0821 05/09/19 1023  BP: (!) 154/101 (!) 141/100 (!) 157/119 (!) 157/98  Pulse: 90 91 88 (!) 103  Resp: 14 14 (!) 9 20  Temp: 98.6 F (37 C)     TempSrc:      SpO2: 100% 100% 100% 100%  Weight:      Height:        Intake/Output Summary (Last 24 hours) at 05/09/2019 1245 Last data filed at 05/09/2019 1020 Gross per 24 hour  Intake 1624 ml  Output --  Net 1624 ml   Filed Weights   05/03/19 0406 05/06/19 0500 05/08/19 0500  Weight: 57.1 kg 51.4 kg 51 kg    Examination: General: Thinly built, not in obvious distress HENT: Normocephalic, pupils equally reacting to light and accommodation.  No scleral pallor or icterus noted. Oral mucosa is moist.  Chest:  Clear breath sounds.  Diminished breath sounds bilaterally. No crackles  or wheezes.  CVS: S1 &S2 heard. No murmur.  Regular rate and rhythm. Abdomen: Soft, nontender, nondistended.  Bowel sounds are heard.  Gastrostomy tube in place with  dressing.   Extremities: No cyanosis, clubbing or edema.  Peripheral pulses are palpable. Psych: Alert, awake and oriented, normal mood CNS:  No cranial nerve deficits.  Power equal in all extremities.  No sensory deficits noted.  No cerebellar signs.   Skin: Warm and dry.  No rashes noted.   Data Reviewed: I have personally reviewed following labs and imaging studies  CBC: Recent Labs  Lab 05/05/19 0438 05/06/19 0321 05/07/19 0305 05/08/19 0238 05/09/19 0419  WBC 9.1 8.5 6.1 5.0 6.0  HGB 7.8* 7.2* 7.0* 6.2* 8.9*  HCT 23.4* 21.4* 21.4* 18.5* 26.6*  MCV 101.3* 101.4* 100.0 102.8* 96.4  PLT 98* 93* 99* 83* 95*   Basic Metabolic Panel: Recent Labs  Lab 05/03/19 0455 05/04/19 0241 05/05/19 0438 05/06/19 0321 05/07/19 0305 05/08/19 0238 05/08/19 1527 05/09/19 0419  NA 140 137  --  141 140  --  138  --   K 3.3* 3.3*  --  3.8 4.1  --  4.3  --   CL 117* 116*  --  115* 114*  --  110  --   CO2 15* 14*  --  14* 20*  --  22  --   GLUCOSE 283* 110*  --  98 72  --  64*  --   BUN 16 18  --  22* 18  --  12  --   CREATININE 1.12* 1.18*  --  1.16* 1.02*  --  0.77  --   CALCIUM 7.9* 7.7*  --  8.1* 7.8*  --  7.8*  --   MG 2.0 1.7 1.6* 2.3 1.9 1.7  --  1.7  PHOS 3.0 2.3* 2.3* 4.0 3.9 2.9  --  3.2   GFR: Estimated Creatinine Clearance: 59.1 mL/min (by C-G formula based on SCr of 0.77 mg/dL). Liver Function Tests: Recent Labs  Lab 05/03/19 0455 05/04/19 0241 05/08/19 1527  AST 23 20 36  ALT 25 23 33  ALKPHOS 109 113 132*  BILITOT 1.0 0.7 0.7  PROT 4.4* 4.2* 4.6*  ALBUMIN 1.7* 1.6* 1.8*   No results for input(s): LIPASE, AMYLASE in the last 168 hours. No results for input(s): AMMONIA in the last 168 hours. Coagulation Profile: No results for input(s): INR, PROTIME in the last 168 hours. Cardiac Enzymes: No results for input(s): CKTOTAL, CKMB, CKMBINDEX, TROPONINI in the last 168 hours. BNP (last 3 results) No results for input(s): PROBNP in the last 8760 hours. HbA1C: No  results for input(s): HGBA1C in the last 72 hours. CBG: Recent Labs  Lab 05/08/19 1115 05/08/19 1710 05/09/19 0016 05/09/19 0820 05/09/19 1137  GLUCAP 111* 116* 76 94 125*   Lipid Profile: No results for input(s): CHOL, HDL, LDLCALC, TRIG, CHOLHDL, LDLDIRECT in the last 72 hours. Thyroid Function Tests: No results for input(s): TSH, T4TOTAL, FREET4, T3FREE, THYROIDAB in the last 72 hours. Anemia Panel: Recent Labs    05/08/19 1527  RETICCTPCT 1.1   Sepsis Labs: Recent Labs  Lab 05/03/19 0455  PROCALCITON 0.99    Recent Results (from the past 240 hour(s))  Blood culture (routine x 2)     Status: None   Collection Time: 04/29/19  4:20 PM   Specimen: Right Antecubital; Blood  Result Value Ref Range Status   Specimen Description RIGHT ANTECUBITAL  Final   Special Requests   Final  BOTTLES DRAWN AEROBIC AND ANAEROBIC Blood Culture adequate volume   Culture   Final    NO GROWTH 5 DAYS Performed at [facility], [place] [address], [place], [place] [address]    Report Status 05/04/2019 FINAL  Final  Culture, Urine     Status: None   Collection Time: 04/29/19  5:15 PM   Specimen: Urine, Random  Result Value Ref Range Status   Specimen Description URINE, RANDOM  Final   Special Requests NONE  Final   Culture   Final    NO GROWTH Performed at [facility], [place] [address], [place], [place] [address]    Report Status 05/01/2019 FINAL  Final  SARS CORONAVIRUS 2 (TAT 6-24 HRS) Nasopharyngeal Nasopharyngeal Swab     Status: None   Collection Time: 04/29/19  8:23 PM   Specimen: Nasopharyngeal Swab  Result Value Ref Range Status   SARS Coronavirus 2 NEGATIVE NEGATIVE Final    Comment: (NOTE) SARS-CoV-2 target nucleic acids are NOT DETECTED. The SARS-CoV-2 RNA is generally detectable in upper and lower respiratory specimens during the acute phase of infection. Negative results do not preclude SARS-CoV-2 infection, do not rule out co-infections with other  pathogens, and should not be used as the sole basis for treatment or other patient management decisions. Negative results must be combined with clinical observations, patient history, and epidemiological information. The expected result is Negative. Fact Sheet for Patients: SugarRoll.be Fact Sheet for Healthcare Providers: https://www.woods-mathews.com/ This test is not yet approved or cleared by the [place] FDA and  has been authorized for detection and/or diagnosis of SARS-CoV-2 by FDA under an Emergency Use Authorization (EUA). This EUA will remain  in effect (meaning this test can be used) for the duration of the COVID-19 declaration under Section 56 4(b)(1) of the Act, 21 U.S.C. section 360bbb-3(b)(1), unless the authorization is terminated or revoked sooner. Performed at [facility], [place] [address] [place], [place] [address]   Blood culture (routine x 2)     Status: None   Collection Time: 04/29/19 11:09 PM   Specimen: BLOOD  Result Value Ref Range Status   Specimen Description BLOOD LEFT ANTECUBITAL  Final   Special Requests   Final    BOTTLES DRAWN AEROBIC AND ANAEROBIC Blood Culture adequate volume   Culture   Final    NO GROWTH 5 DAYS Performed at [facility], [place] [address], [place], [place] [address]    Report Status 05/05/2019 FINAL  Final  MRSA PCR Screening     Status: None   Collection Time: 04/30/19  5:31 PM   Specimen: Nasopharyngeal  Result Value Ref Range Status   MRSA by PCR NEGATIVE NEGATIVE Final    Comment:        The GeneXpert MRSA Assay (FDA approved for NASAL specimens only), is one component of a comprehensive MRSA colonization surveillance program. It is not intended to diagnose MRSA infection nor to guide or monitor treatment for MRSA infections. Performed at [facility], [place] [address], [place], [place] [address]          Radiology Studies: DG ABDOMEN PEG TUBE  LOCATION  Result Date: 05/08/2019 CLINICAL DATA:  Evaluate for leak. EXAM: ABDOMEN - 1 VIEW COMPARISON:  Upper GI 05/03/2019 FINDINGS: Water-soluble contrast material was injected into the indwelling left upper quadrant gastrostomy tube. This results in opacification of the stomach lumen without extravasation to suggest leak. Enteric contrast material within the colon is noted  which is likely from upper GI performed 05/03/2019 IMPRESSION: 1. Contrast injection of the indwelling gastrostomy tube opacifies the stomach without evidence for contrast extravasation to suggest leak. Electronically Signed   By: Kerby Moors M.D.   On: 05/08/2019 09:24   Scheduled Meds: . vitamin C  500 mg Per Tube BID  . cholecalciferol  3,000 Units Per Tube Daily  . feeding supplement (OSMOLITE 1.5 CAL)  80-237 mL Per Tube 5 X Daily  . folic acid  1 mg Per Tube Daily  . free water  40 mL Per Tube 5 X Daily  . lisinopril  20 mg Oral Daily   Or  . hydrochlorothiazide  12.5 mg Oral Daily  . influenza vac split quadrivalent PF  0.5 mL Intramuscular Tomorrow-1000  . methocarbamol  500 mg Oral TID  . multivitamin  1 tablet Oral Daily  . OLANZapine  2.5 mg Oral Daily  . oxyCODONE  10 mg Oral Q12H  . pantoprazole  40 mg Oral QHS  . pregabalin  25 mg Oral BID  . QUEtiapine  50 mg Oral QHS  . thiamine  100 mg Per Tube Daily  . vitamin A  10,000 Units Per Tube Daily  . vitamin E  400 Units Per Tube Daily   Continuous Infusions: . lactated ringers Stopped (05/02/19 1216)  . lactated ringers Stopped (05/08/19 0710)     LOS: 9 days   Flora Lipps, MD [facility] 05/09/2019, 12:45 PM

## 2019-05-09 NOTE — Progress Notes (Signed)
Hypoglycemic Event  CBG: 58 @ 2038  Treatment: 4 oz orange juice  Symptoms: none  Follow-up CBG:  Time:2125 CBG Result: 111  Possible Reasons for Event: Ate little dinner  Comments/MD notified:Treatment protocol followed    Peterson Ao

## 2019-05-09 NOTE — Progress Notes (Signed)
5 Days Post-Op   Subjective/Chief Complaint: Reports less leaking around G-tube with feeds   Objective: Vital signs in last 24 hours: Temp:  [97.8 F (36.6 C)-99.1 F (37.3 C)] 98.6 F (37 C) (01/13 0506) Pulse Rate:  [80-99] 91 (01/13 0657) Resp:  [12-21] 14 (01/13 0657) BP: (124-163)/(92-107) 141/100 (01/13 0657) SpO2:  [94 %-100 %] 100 % (01/13 0657) Last BM Date: 05/08/19  Intake/Output from previous day: 01/12 0701 - 01/13 0700 In: 1266 [P.O.:240; Blood:836; NG/GT:190] Out: -  Intake/Output this shift: No intake/output data recorded.  Exam: Awake and alert Looks comfortable Abdomen soft, G-tube site ok, dry  Lab Results:  Recent Labs    05/08/19 0238 05/09/19 0419  WBC 5.0 6.0  HGB 6.2* 8.9*  HCT 18.5* 26.6*  PLT 83* 95*   BMET Recent Labs    05/07/19 0305 05/08/19 1527  NA 140 138  K 4.1 4.3  CL 114* 110  CO2 20* 22  GLUCOSE 72 64*  BUN 18 12  CREATININE 1.02* 0.77  CALCIUM 7.8* 7.8*   PT/INR No results for input(s): LABPROT, INR in the last 72 hours. ABG No results for input(s): PHART, HCO3 in the last 72 hours.  Invalid input(s): PCO2, PO2  Studies/Results: DG ABDOMEN PEG TUBE LOCATION  Result Date: 05/08/2019 CLINICAL DATA:  Evaluate for leak. EXAM: ABDOMEN - 1 VIEW COMPARISON:  Upper GI 05/03/2019 FINDINGS: Water-soluble contrast material was injected into the indwelling left upper quadrant gastrostomy tube. This results in opacification of the stomach lumen without extravasation to suggest leak. Enteric contrast material within the colon is noted which is likely from upper GI performed 05/03/2019 IMPRESSION: 1. Contrast injection of the indwelling gastrostomy tube opacifies the stomach without evidence for contrast extravasation to suggest leak. Electronically Signed   By: Kerby Moors M.D.   On: 05/08/2019 09:24    Anti-infectives: Anti-infectives (From admission, onward)   Start     Dose/Rate Route Frequency Ordered Stop   05/04/19  1015  ceFAZolin (ANCEF) IVPB 2g/100 mL premix     2 g 200 mL/hr over 30 Minutes Intravenous To Short Stay 05/04/19 0958 05/05/19 1015   05/01/19 0300  vancomycin (VANCOREADY) IVPB 500 mg/100 mL  Status:  Discontinued     500 mg 100 mL/hr over 60 Minutes Intravenous Every 24 hours 04/30/19 0833 05/02/19 1348   04/30/19 2200  vancomycin (VANCOREADY) IVPB 500 mg/100 mL  Status:  Discontinued     500 mg 100 mL/hr over 60 Minutes Intravenous Every 24 hours 04/30/19 0103 04/30/19 0833   04/30/19 1000  ceFEPIme (MAXIPIME) 2 g in sodium chloride 0.9 % 100 mL IVPB  Status:  Discontinued     2 g 200 mL/hr over 30 Minutes Intravenous Every 12 hours 04/30/19 0103 05/04/19 1552   04/29/19 2300  vancomycin (VANCOCIN) IVPB 1000 mg/200 mL premix     1,000 mg 200 mL/hr over 60 Minutes Intravenous  Once 04/29/19 2247 04/30/19 0447   04/29/19 2300  ceFEPIme (MAXIPIME) 2 g in sodium chloride 0.9 % 100 mL IVPB     2 g 200 mL/hr over 30 Minutes Intravenous  Once 04/29/19 2247 04/30/19 0020      Assessment/Plan: DM CKD HTN GERD Hx seizures  Chronic anemia  Hypoglycemia- patient with 2 episodes since yesterday, will discuss with attending and TRH *above per medicine and appreciate their assistance*   Continue tube feeds and oral intake monitoring  Hgb up post transfusion G-tube in good location without internal leak on XRAY study  Continue working with PT and working toward home  LOS: 9 days    Angie [name] 05/09/2019

## 2019-05-09 NOTE — Progress Notes (Signed)
Occult stool positive. On call notified for stat H&H. New orders obtained for 3 u prbc's. Pt's BP elevated. New order obtained for pain management as pain level is 10/10 at present.

## 2019-05-10 DIAGNOSIS — G609 Hereditary and idiopathic neuropathy, unspecified: Secondary | ICD-10-CM

## 2019-05-10 LAB — CBC
HCT: 31 % — ABNORMAL LOW (ref 36.0–46.0)
Hemoglobin: 10.3 g/dL — ABNORMAL LOW (ref 12.0–15.0)
MCH: 31.7 pg (ref 26.0–34.0)
MCHC: 33.2 g/dL (ref 30.0–36.0)
MCV: 95.4 fL (ref 80.0–100.0)
Platelets: 108 10*3/uL — ABNORMAL LOW (ref 150–400)
RBC: 3.25 MIL/uL — ABNORMAL LOW (ref 3.87–5.11)
RDW: 19.1 % — ABNORMAL HIGH (ref 11.5–15.5)
WBC: 6.1 10*3/uL (ref 4.0–10.5)
nRBC: 0 % (ref 0.0–0.2)

## 2019-05-10 LAB — COMPREHENSIVE METABOLIC PANEL
ALT: 29 U/L (ref 0–44)
AST: 24 U/L (ref 15–41)
Albumin: 2 g/dL — ABNORMAL LOW (ref 3.5–5.0)
Alkaline Phosphatase: 151 U/L — ABNORMAL HIGH (ref 38–126)
Anion gap: 8 (ref 5–15)
BUN: 10 mg/dL (ref 6–20)
CO2: 21 mmol/L — ABNORMAL LOW (ref 22–32)
Calcium: 8 mg/dL — ABNORMAL LOW (ref 8.9–10.3)
Chloride: 109 mmol/L (ref 98–111)
Creatinine, Ser: 0.82 mg/dL (ref 0.44–1.00)
GFR calc Af Amer: >60 mL/min (ref >60)
GFR calc non Af Amer: >60 mL/min (ref >60)
Glucose, Bld: 131 mg/dL — ABNORMAL HIGH (ref 70–99)
Potassium: 4.5 mmol/L (ref 3.5–5.1)
Sodium: 138 mmol/L (ref 135–145)
Total Bilirubin: 1.1 mg/dL (ref 0.3–1.2)
Total Protein: 5 g/dL — ABNORMAL LOW (ref 6.5–8.1)

## 2019-05-10 LAB — GLUCOSE, CAPILLARY
Glucose-Capillary: 100 mg/dL — ABNORMAL HIGH (ref 70–99)
Glucose-Capillary: 117 mg/dL — ABNORMAL HIGH (ref 70–99)
Glucose-Capillary: 64 mg/dL — ABNORMAL LOW (ref 70–99)
Glucose-Capillary: 65 mg/dL — ABNORMAL LOW (ref 70–99)
Glucose-Capillary: 73 mg/dL (ref 70–99)
Glucose-Capillary: 78 mg/dL (ref 70–99)
Glucose-Capillary: 80 mg/dL (ref 70–99)

## 2019-05-10 LAB — PHOSPHORUS: Phosphorus: 2.6 mg/dL (ref 2.5–4.6)

## 2019-05-10 LAB — VITAMIN B1: Vitamin B1 (Thiamine): 28.9 nmol/L — ABNORMAL LOW (ref 66.5–200.0)

## 2019-05-10 LAB — MAGNESIUM: Magnesium: 1.6 mg/dL — ABNORMAL LOW (ref 1.7–2.4)

## 2019-05-10 LAB — PATHOLOGIST SMEAR REVIEW

## 2019-05-10 MED ORDER — OSMOLITE 1.5 CAL PO LIQD
120.0000 mL | Freq: Every day | ORAL | Status: DC
  Administered 2019-05-10 – 2019-05-11 (×5): 120 mL
  Filled 2019-05-10 (×10): qty 237

## 2019-05-10 MED ORDER — MAGNESIUM SULFATE 2 GM/50ML IV SOLN
2.0000 g | Freq: Once | INTRAVENOUS | Status: AC
  Administered 2019-05-10: 2 g via INTRAVENOUS
  Filled 2019-05-10: qty 50

## 2019-05-10 MED ORDER — PREGABALIN 25 MG PO CAPS
50.0000 mg | ORAL_CAPSULE | Freq: Two times a day (BID) | ORAL | Status: DC
  Administered 2019-05-10 – 2019-05-11 (×2): 50 mg via ORAL
  Filled 2019-05-10 (×2): qty 2

## 2019-05-10 MED ORDER — PRO-STAT SUGAR FREE PO LIQD
30.0000 mL | Freq: Three times a day (TID) | ORAL | Status: DC
  Administered 2019-05-10 – 2019-05-11 (×3): 30 mL
  Filled 2019-05-10 (×3): qty 30

## 2019-05-10 NOTE — Progress Notes (Signed)
[facility]/Trauma Progress Note  6 Days Post-Op   Assessment/Plan DM CKD HTN GERD Hx seizures  Chronic anemia  Hypoglycemia *above per medicine and appreciate their assistance*  FTT  Protein Calorie Malnutrition (pre-alb 9.9) Dysphagia andPostprandial emesis  - Patient with Hx of Roux-en-Y in 2007.The patient suffered from weight loss and malnutrition over the last several yearsafter struggling withstenosis related to ulcer at the gastrojejunostomy site requiring multiple endoscopies and dilations.She ultimately underwent alaparoscopic revision of gastrojejunostomy and insertion of gastrostomy tube to remnant stomachin 2019 by Dr. Kieth Brightly. G-tube has since been removed.  - EGD on 1/6 that showed no stricture or stenosis and a small gastric remnant.  - Upper GI1/7showed slow clearance of the esophagus with esophageal dysmotility, no stricture. This also showed small gastric pouch with immediate emptying into the small bowel.  -s/p laparoscopic gastrostomy tube insertion, Dr. Quinn Axe, 01/08 - Tube feeds - G-tube in good location without internal leak on XRAY study Macrocytic anemia/folate deficiency: -Patient received 1 unit PRBC for hemoglobin of 6.4 on 1/3, hgb responded to blood but has slowly drifted back down, likely consumptive - Hgb 1/12 6.2, got 1 Unit - Hgb today 10.3   FEN -tube feeds and carb modified  VTE -SCDs ID -no antibiotics currently, afebrile, WBC6.1 Follow up: Dr. Kieth Brightly  Plan: continue TF's. Recheck HGB in am. If stable pt should be ready for discharge tomorrow.    Pt lives at home with her fiance and 6yo son   LOS: 10 days    Subjective: CC: abdominal soreness  Drainage around G tube is better but still there. No nausea or vomiting. Having good BM's. Eating breakfast. Tolerating TF's.   Objective: Vital signs in last 24 hours: Temp:  [98.1 F (36.7 C)-99.1 F (37.3 C)] 99.1 F (37.3 C) (01/14  0830) Pulse Rate:  [113-115] 115 (01/14 0830) Resp:  [14-20] 17 (01/14 0830) BP: (119-137)/(94-113) 137/113 (01/14 0830) SpO2:  [100 %] 100 % (01/14 0830) Weight:  [53 kg] 53 kg (01/14 0500) Last BM Date: 05/10/19  Intake/Output from previous day: 01/13 0701 - 01/14 0700 In: 838 [P.O.:838] Out: -  Intake/Output this shift: No intake/output data recorded.  PE:  Gen: Alert, NAD, pleasant, cooperative Card: mild tachycardia, regular rhythm Pulm:Rate andeffort normal, CTA BL, no W/R/R noted Abd: Soft, ND,+BS,port sites with glue intact are well appearing and are without drainage or signs of infection. G tube sitewith scant drainage that appears to be TF's. Skin surrounding site is without signs of infection. Generalized TTP without guarding. No peritonitis. Skin: no rashes noted, warm and dry  Anti-infectives: Anti-infectives (From admission, onward)   Start     Dose/Rate Route Frequency Ordered Stop   05/04/19 1015  ceFAZolin (ANCEF) IVPB 2g/100 mL premix     2 g 200 mL/hr over 30 Minutes Intravenous To Short Stay 05/04/19 0958 05/05/19 1015   05/01/19 0300  vancomycin (VANCOREADY) IVPB 500 mg/100 mL  Status:  Discontinued     500 mg 100 mL/hr over 60 Minutes Intravenous Every 24 hours 04/30/19 0833 05/02/19 1348   04/30/19 2200  vancomycin (VANCOREADY) IVPB 500 mg/100 mL  Status:  Discontinued     500 mg 100 mL/hr over 60 Minutes Intravenous Every 24 hours 04/30/19 0103 04/30/19 0833   04/30/19 1000  ceFEPIme (MAXIPIME) 2 g in sodium chloride 0.9 % 100 mL IVPB  Status:  Discontinued     2 g 200 mL/hr over 30 Minutes Intravenous Every 12 hours 04/30/19 0103 05/04/19 1552  04/29/19 2300  vancomycin (VANCOCIN) IVPB 1000 mg/200 mL premix     1,000 mg 200 mL/hr over 60 Minutes Intravenous  Once 04/29/19 2247 04/30/19 0447   04/29/19 2300  ceFEPIme (MAXIPIME) 2 g in sodium chloride 0.9 % 100 mL IVPB     2 g 200 mL/hr over 30 Minutes Intravenous  Once 04/29/19 2247 04/30/19  0020      Lab Results:  Recent Labs    05/09/19 0419 05/10/19 0205  WBC 6.0 6.1  HGB 8.9* 10.3*  HCT 26.6* 31.0*  PLT 95* 108*   BMET Recent Labs    05/08/19 1527 05/10/19 0205  NA 138 138  K 4.3 4.5  CL 110 109  CO2 22 21*  GLUCOSE 64* 131*  BUN 12 10  CREATININE 0.77 0.82  CALCIUM 7.8* 8.0*   PT/INR No results for input(s): LABPROT, INR in the last 72 hours. CMP     Component Value Date/Time   NA 138 05/10/2019 0205   K 4.5 05/10/2019 0205   CL 109 05/10/2019 0205   CO2 21 (L) 05/10/2019 0205   GLUCOSE 131 (H) 05/10/2019 0205   BUN 10 05/10/2019 0205   CREATININE 0.82 05/10/2019 0205   CALCIUM 8.0 (L) 05/10/2019 0205   PROT 5.0 (L) 05/10/2019 0205   ALBUMIN 2.0 (L) 05/10/2019 0205   AST 24 05/10/2019 0205   ALT 29 05/10/2019 0205   ALKPHOS 151 (H) 05/10/2019 0205   BILITOT 1.1 05/10/2019 0205   GFRNONAA >60 05/10/2019 0205   GFRAA >60 05/10/2019 0205   Lipase     Component Value Date/Time   LIPASE 13 04/29/2019 1650    Studies/Results: No results found.   Kalman Drape, PA-C [facility] Please see amion for pager for the following: Myna Hidalgo, W, & Friday 7:00am - 4:30pm Thursdays 7:00am -11:30am

## 2019-05-10 NOTE — Progress Notes (Signed)
Occupational Therapy Treatment Patient Details Name: Angie [name] MRN: [id number] DOB: March 09, 1966 Today's Date: 05/10/2019    History of present illness Angie [name] is a 54 y.o. female with history of gastric bypass surgery prior to which she had a history of diabetes mellitus CKD stage III, chronic anemia, chronic malnutrition with history of esophageal strictures, bipolar disorder and chronic pain, chronic anemia. Patient admitted for unresponsive episode secondary to hypoglycemia in the setting of failure to thrive.   OT comments  Patient seated in recliner and agreeable to OT.  Completing functional mobility in room with close supervision using RW, toilet transfers with supervision and toileting with min guard assist.  Dynamic balance during ADLs min guard to close supervision for safety.  Educated on energy conservation techniques and safety for engagement in ADLs with increased tolerance and safety, pt verbalizes understanding.  She will have good support at home, and d/c plan with [facility] services remains appropriate.    Follow Up Recommendations  Home health OT;Supervision - Intermittent    Equipment Recommendations  3 in 1 bedside commode    Recommendations for Other Services      Precautions / Restrictions Precautions Precautions: Fall Restrictions Weight Bearing Restrictions: No       Mobility Bed Mobility Overal bed mobility: Needs Assistance Bed Mobility: Sit to Supine     Supine to sit: Modified independent (Device/Increase time) Sit to supine: Modified independent (Device/Increase time)   General bed mobility comments: slow moving but no assist required  Transfers Overall transfer level: Needs assistance Equipment used: Rolling walker (2 wheeled);None Transfers: Sit to/from Stand Sit to Stand: Supervision Stand pivot transfers: Supervision       General transfer comment: supervision for safety, good hand placement and technique     Balance Overall  balance assessment: Needs assistance Sitting-balance support: No upper extremity supported;Feet supported Sitting balance-Leahy Scale: Normal     Standing balance support: During functional activity;Bilateral upper extremity supported;No upper extremity supported Standing balance-Leahy Scale: Fair Standing balance comment: able to stand without UE support statically, but requires B UE support dynamically                           ADL either performed or assessed with clinical judgement   ADL Overall ADL's : Needs assistance/impaired     Grooming: Min guard;Standing       Lower Body Bathing: Min guard;Sit to/from stand       Lower Body Dressing: Min guard;Sit to/from Archivist: Ambulation;RW;Regular Toilet;Supervision/safety   Toileting- Water quality scientist and Hygiene: Min guard;Sit to/from stand       Functional mobility during ADLs: Min guard;Rolling walker;Cueing for safety General ADL Comments: limited by generalized weakness and decreased activity tolerance     Vision       Perception     Praxis      Cognition Arousal/Alertness: Awake/alert Behavior During Therapy: WFL for tasks assessed/performed Overall Cognitive Status: Within Functional Limits for tasks assessed                                          Exercises     Shoulder Instructions       General Comments      Pertinent Vitals/ Pain       Pain Assessment: Faces Faces Pain Scale: Hurts a little bit Pain Location: B  feet  Pain Descriptors / Indicators: Discomfort;Pins and needles Pain Intervention(s): Monitored during session;Limited activity within patient's tolerance;Repositioned  Home Living                                          Prior Functioning/Environment              Frequency  Min 2X/week        Progress Toward Goals  OT Goals(current goals can now be found in the care plan section)  Progress towards  OT goals: Progressing toward goals  Acute Rehab OT Goals Patient Stated Goal: return home OT Goal Formulation: With patient  Plan Discharge plan remains appropriate;Frequency remains appropriate    Co-evaluation                 AM-PAC OT "6 Clicks" Daily Activity     Outcome Measure   Help from another person eating meals?: None Help from another person taking care of personal grooming?: A Little Help from another person toileting, which includes using toliet, bedpan, or urinal?: A Little Help from another person bathing (including washing, rinsing, drying)?: A Little Help from another person to put on and taking off regular upper body clothing?: A Little Help from another person to put on and taking off regular lower body clothing?: A Little 6 Click Score: 19    End of Session Equipment Utilized During Treatment: Rolling walker  OT Visit Diagnosis: Other abnormalities of gait and mobility (R26.89);Muscle weakness (generalized) (M62.81)   Activity Tolerance Patient tolerated treatment well   Patient Left in bed;with call bell/phone within reach;with nursing/sitter in room   Nurse Communication Mobility status        Time: [id number] OT Time Calculation (min): 27 min  Charges: OT General Charges $OT Visit: 1 Visit OT Treatments $Self Care/Home Management : 23-37 mins  [place] Pager [phone] Office [phone]    Delight Stare 05/10/2019, 3:14 PM

## 2019-05-10 NOTE — Progress Notes (Addendum)
Nutrition Follow-up  INTERVENTION:   -Continue gravity feeds of Osmolite 1.5. Advance to goal rate of 237 ml TID via G-tube.  -Provide 30 ml Prostat (or equivalent alternative) TID -Free water flush with 20 ml before and after each bolus -Provides 1365 kcals (91% of needs), 89g protein and 663 ml H2O  -Continue PO intake as tolerated -Continue vitamin/mineral repletions  NUTRITION DIAGNOSIS:   Inadequate oral intake related to lethargy/confusion, poor appetite as evidenced by per patient/family report, meal completion < 25%.  Ongoing.  GOAL:   Patient will meet greater than or equal to 90% of their needs  Progressing.  MONITOR:   PO intake, Supplement acceptance, Labs, Weight trends, I & O's, TF tolerance  ASSESSMENT:   54 y.o. 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 Pt refusing some feedings as well.  Will add Prostats for more concentrated kcals and protein with minimal volume.  Pt ate 95% of a meal yesterday. Pt has continued to have some instances of hypoglycemia. Vitamins/minerals are being repleted. Will adjust TF orders. Also, given volume limitations of remnant stomach, may be difficult to meet nutritional needs with large bolus volumes.  Admission weight: 120 lbs. Current weight: 116 lbs.  I/Os: +11L since admit  Medications: vitamin C tablet, Vitamin D tablet, Folic acid tablet,  Prosight MVI tablet,  Thiamine tablet, Vitamin A capsule,  Vitamin E capsule  Labs reviewed: CBGs: 78-80  Low Mg Phos WNL  Diet Order:   Diet Order            Diet Carb Modified Fluid consistency: Thin; Room service appropriate? Yes  Diet effective now              EDUCATION NEEDS:   Not appropriate for education at this time  Skin:  Skin Assessment: Reviewed RN Assessment(incision abdomen)  Last BM:  1/14 -type 4  Height:   Ht Readings from Last 1 Encounters:  05/02/19 5' (1.524 m)    Weight:   Wt Readings from Last 1 Encounters:  05/10/19 53 kg    Ideal Body Weight:  45.4 kg  BMI:  Body mass index is 22.82 kg/m.  Estimated Nutritional Needs:   Kcal:  1500-1700kcal/day  Protein:  75-85g/day  Fluid:  >1.4L/day  Clayton Bibles, MS, RD, LDN Inpatient Clinical Dietitian Pager: [phone] After Hours Pager: [phone]

## 2019-05-10 NOTE — Progress Notes (Signed)
PROGRESS NOTE    Angie [name]  [id number] DOB: 14-Jun-1965 DOA: 04/29/2019 PCP: Harvie Junior, MD   Brief Narrative:  Angie [name] is a 54 y.o. female with history of gastric bypass surgery, history of diabetes mellitus, CKD stage III, chronic anemia, chronic malnutrition with history of esophageal strictures, bipolar disorder and chronic pain, chronic anemia. Patient was admitted for unresponsive episode secondary to hypoglycemia in the setting of failure to thrive. 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 Patient has gastrostomy tube and is receiving tube feeds. Medical team was consulted for medical management.  Assessment & Plan:   Principal Problem:   Hypoglycemia Active Problems:   S/P gastric bypass   Intractable nausea and vomiting   Biliary anastomotic occlusion   Loss of weight   Hypothermia   Nausea & vomiting   Failure to thrive in adult   History of gastric bypass   Postprandial vomiting/Dysphagia/Failure to thrive History of Roux-en-Y gastric bypass. EGD performed on 1/6 and was significant for tiny gastric remnant. Concern is gastric remnant is too small to allow for proper nutritional absorption so gastrostomy tube was placed by general surgery.  Currently on tube feeds and oral diet.  Patient  states to me that she had some leakage around the tube yesterday.  Management as per primary team.  hyponatremia, hypomagnesemia, hypophosphatemia, hypokalemia. We will continue to replenish as necessary.  Latest magnesium of 1.6 phosphorus of 2.6, potassium of 4.5.  Will replenish magnesium today.  Hypothermia Resolved.  Severe hepatic steatosis Noted on MRCP.  Will need to monitor as outpatient.  Distended gallbladder Abdominal ultrasound showed some sludge without gallbladder wall thickening, biliary duct dilation with no mass or calculus seen. MRI significant for common bile duct up to 7 mm.  Rest of the LFTs within normal  limits.  No abdominal pain.  Surgery on board.  Bilateral hydronephrosis, moderate Hydroureter Resolved.  Noted on initial MRI. Creatinine stable.  Renal ultrasound on 05/02/2018 which showed no evidence of hydronephrosis.  Macrocytic anemia/thrombocytopenia Low folate. Normal/high vitamin B12.  Continue with blood transfusion of 1 unit on 04/29/2018.  Hemoglobin trended down to 6.2. PRBC ordered by primary team.  Hemoglobin today is 10.3. Continue folate/thiamine.  CBC daily.  Haptoglobin within normal limits.  Fecal occult was positive.  LDH was mildly elevated.  No reticulocytosis.  Continue vitamin C supplements.  Thrombocytopenia No evidence of external bleeding.  Closely monitor.  Could be a component of macrocytic anemia. Platelets today of 108.  Hypoglycemia   Recurrent and asymptomatic.  On tube feeding.  Had hypoglycemia this morning.  Essential hypertension Continue lisinopril HCTZ.  Closely monitor electrolytes.  BP stable at this time.  Bilateral LE edema/Anasarca/ Due to hypoalbuminemia and poor nutrition..  Continue nutritional improvement. On tube feeds. Albumin of 2.0  Leukopenia Resolved.  CKD stage III Stable.  Check BMP in a.m.  Latest creatinine of 0.8  Liver lesion Complex cystic lesion of left lobe.  Will need outpatient follow-up.  Generalized weakness   PT recommends home health PT on discharge.  Bipolar disorder/Anxiety -Continue Seroquel, Xanax.  Controlled at this time.  Chronic pain syndrome, peripheral neuropathy on oxycodone, continue Lyrica, will increase the dose of Lyrica to 50 mg twice a day since she still complains of significant burning sensation   DVT prophylaxis: SCD  Code Status: Full code  Family Communication:     Disposition Plan: Discharge plan as per primary team.  Physical therapy recommends home health PT  on discharge with wheelchair and rolling walker.   Procedures:    05/02/2019: EGD Impression:               1. Status  post Roux-en-Y gastric bypass surgery. No                            stricture or stenosis                           2. Tiny gastric remnant. I wonder if gastric volume                            is so small as to explain her issues with eating                            difficulties, weight loss, and metabolic                            abnormalities. Recommendation:           1. Soft diet                           2. Upper GI series to evaluate gastric remnant size                           3. Ask bariatric surgeons to weigh in on her                            current anatomy to see if she would benefit from                            reversal of her surgery. This could likely be done                            endoscopically.                           4. We will follow   05/04/2019: Laparoscopic gastrostomy tube insertion  PRBC transfusion  Antimicrobials: None  Subjective: Today, patient states that she has some leakage around the tube feeding site yesterday.  Complained of mild abdominal discomfort.  Moving bowels okay.  No nausea or vomiting.  Complains of mild leg pain as well.  Objective: Vitals:   05/10/19 0019 05/10/19 0115 05/10/19 0438 05/10/19 0500  BP:  (!) 124/97 (!) 119/94   Pulse:      Resp:  16 14   Temp: 98.1 F (36.7 C) 98.3 F (36.8 C) 98.3 F (36.8 C)   TempSrc: Oral Oral    SpO2:  100% 100%   Weight:    53 kg  Height:        Intake/Output Summary (Last 24 hours) at 05/10/2019 0734 Last data filed at 05/10/2019 0408 Gross per 24 hour  Intake 838 ml  Output --  Net 838 ml   Filed Weights   05/06/19 0500 05/08/19 0500 05/10/19 0500  Weight: 51.4 kg 51 kg 53 kg    Physical Examination:  General: Thinly  built, not in obvious distress HENT: Normocephalic, pupils equally reacting to light and accommodation.  No scleral pallor or icterus noted. Oral mucosa is moist.  Chest:  Clear breath sounds.  Diminished breath sounds bilaterally. No crackles or  wheezes.  CVS: S1 &S2 heard. No murmur.  Regular rate and rhythm. Abdomen: Soft, nontender, nondistended.  Bowel sounds are heard. Gastrostomy tube in place with dressing.   Extremities: No cyanosis, clubbing or edema.  Peripheral pulses are palpable. Psych: Alert, awake and oriented, normal mood CNS:  No cranial nerve deficits.  Power equal in all extremities.   Skin: Warm and dry.  No rashes noted.   Data Reviewed: I have personally reviewed following labs and imaging studies  CBC: Recent Labs  Lab 05/06/19 0321 05/07/19 0305 05/08/19 0238 05/09/19 0419 05/10/19 0205  WBC 8.5 6.1 5.0 6.0 6.1  HGB 7.2* 7.0* 6.2* 8.9* 10.3*  HCT 21.4* 21.4* 18.5* 26.6* 31.0*  MCV 101.4* 100.0 102.8* 96.4 95.4  PLT 93* 99* 83* 95* [id number]*   Basic Metabolic Panel: Recent Labs  Lab 05/04/19 0241 05/05/19 0438 05/06/19 0321 05/07/19 0305 05/08/19 0238 05/08/19 1527 05/09/19 0419 05/10/19 0205  NA 137  --  141 140  --  138  --  138  K 3.3*  --  3.8 4.1  --  4.3  --  4.5  CL 116*  --  115* 114*  --  110  --  109  CO2 14*  --  14* 20*  --  22  --  21*  GLUCOSE 110*  --  98 72  --  64*  --  131*  BUN 18  --  22* 18  --  12  --  10  CREATININE 1.18*  --  1.16* 1.02*  --  0.77  --  0.82  CALCIUM 7.7*  --  8.1* 7.8*  --  7.8*  --  8.0*  MG 1.7   < > 2.3 1.9 1.7  --  1.7 1.6*  PHOS 2.3*   < > 4.0 3.9 2.9  --  3.2 2.6   < > = values in this interval not displayed.   GFR: Estimated Creatinine Clearance: 57.6 mL/min (by C-G formula based on SCr of 0.82 mg/dL). Liver Function Tests: Recent Labs  Lab 05/04/19 0241 05/08/19 1527 05/10/19 0205  AST 20 36 24  ALT 23 33 29  ALKPHOS 113 132* 151*  BILITOT 0.7 0.7 1.1  PROT 4.2* 4.6* 5.0*  ALBUMIN 1.6* 1.8* 2.0*   No results for input(s): LIPASE, AMYLASE in the last 168 hours. No results for input(s): AMMONIA in the last 168 hours. Coagulation Profile: No results for input(s): INR, PROTIME in the last 168 hours. Cardiac Enzymes: No results  for input(s): CKTOTAL, CKMB, CKMBINDEX, TROPONINI in the last 168 hours. BNP (last 3 results) No results for input(s): PROBNP in the last 8760 hours. HbA1C: No results for input(s): HGBA1C in the last 72 hours. CBG: Recent Labs  Lab 05/09/19 2038 05/09/19 2125 05/10/19 0027 05/10/19 0123 05/10/19 0437  GLUCAP 58* 111* 65* 100* 80   Lipid Profile: No results for input(s): CHOL, HDL, LDLCALC, TRIG, CHOLHDL, LDLDIRECT in the last 72 hours. Thyroid Function Tests: No results for input(s): TSH, T4TOTAL, FREET4, T3FREE, THYROIDAB in the last 72 hours. Anemia Panel: Recent Labs    05/08/19 1527  RETICCTPCT 1.1   Sepsis Labs: No results for input(s): PROCALCITON, LATICACIDVEN in the last 168 hours.  Recent Results (from the past 240 hour(s))  MRSA  PCR Screening     Status: None   Collection Time: 04/30/19  5:31 PM   Specimen: Nasopharyngeal  Result Value Ref Range Status   MRSA by PCR NEGATIVE NEGATIVE Final    Comment:        The GeneXpert MRSA Assay (FDA approved for NASAL specimens only), is one component of a comprehensive MRSA colonization surveillance program. It is not intended to diagnose MRSA infection nor to guide or monitor treatment for MRSA infections. Performed at [facility], [place] [address], [place], [place] [address]       Radiology Studies: DG ABDOMEN PEG TUBE LOCATION  Result Date: 05/08/2019 CLINICAL DATA:  Evaluate for leak. EXAM: ABDOMEN - 1 VIEW COMPARISON:  Upper GI 05/03/2019 FINDINGS: Water-soluble contrast material was injected into the indwelling left upper quadrant gastrostomy tube. This results in opacification of the stomach lumen without extravasation to suggest leak. Enteric contrast material within the colon is noted which is likely from upper GI performed 05/03/2019 IMPRESSION: 1. Contrast injection of the indwelling gastrostomy tube opacifies the stomach without evidence for contrast extravasation to suggest leak. Electronically  Signed   By: Kerby Moors M.D.   On: 05/08/2019 09:24   Scheduled Meds: . vitamin C  500 mg Per Tube BID  . cholecalciferol  3,000 Units Per Tube Daily  . feeding supplement (OSMOLITE 1.5 CAL)  80-237 mL Per Tube 5 X Daily  . folic acid  1 mg Per Tube Daily  . free water  40 mL Per Tube 5 X Daily  . lisinopril  20 mg Oral Daily   Or  . hydrochlorothiazide  12.5 mg Oral Daily  . influenza vac split quadrivalent PF  0.5 mL Intramuscular Tomorrow-1000  . methocarbamol  500 mg Oral TID  . multivitamin  1 tablet Oral Daily  . OLANZapine  2.5 mg Oral Daily  . oxyCODONE  10 mg Oral Q12H  . pantoprazole  40 mg Oral QHS  . pregabalin  25 mg Oral BID  . QUEtiapine  50 mg Oral QHS  . thiamine  100 mg Per Tube Daily  . vitamin A  10,000 Units Per Tube Daily  . vitamin E  400 Units Per Tube Daily   Continuous Infusions: . lactated ringers Stopped (05/02/19 1216)  . lactated ringers Stopped (05/08/19 0710)     LOS: 10 days   Flora Lipps, MD [facility] 05/10/2019,

## 2019-05-10 NOTE — Discharge Instructions (Addendum)
Please arrive at least 30 min before your appointment to complete your check in paperwork.  If you are unable to arrive 30 min prior to your appointment time we may have to cancel or reschedule you.  LAPAROSCOPIC SURGERY: POST OP INSTRUCTIONS   1. Take your usually prescribed home medications unless otherwise directed. 2. PAIN CONTROL:  1. Pain is best controlled by a usual combination of three different methods TOGETHER:  1. Ice/Heat 2. Over the counter pain medication 3. Prescription pain medication 2. Most patients will experience some swelling and bruising around the incisions. Ice packs or heating pads (30-60 minutes up to 6 times a day) will help. Use ice for the first few days to help decrease swelling and bruising, then switch to heat to help relax tight/sore spots and speed recovery. Some people prefer to use ice alone, heat alone, alternating between ice & heat. Experiment to what works for you. Swelling and bruising can take several weeks to resolve.  3. It is helpful to take an over-the-counter pain medication regularly for the first few weeks. Choose one of the following that works best for you:  1. Naproxen (Aleve, etc) Two 220mg  tabs twice a day 2. Ibuprofen (Advil, etc) Three 200mg  tabs four times a day (every meal & bedtime) 3. Acetaminophen (Tylenol, etc) 500-650mg  four times a day (every meal & bedtime) 4. A prescription for pain medication (such as oxycodone, hydrocodone, etc) should be given to you upon discharge. Take your pain medication as prescribed.  1. If you are having problems/concerns with the prescription medicine (does not control pain, nausea, vomiting, rash, itching, etc), please call us [phone] to see if we need to switch you to a different pain medicine that will work better for you and/or control your side effect better. 2. If you need a refill on your pain medication, please contact your pharmacy. They will contact our office to request authorization.  Prescriptions will not be filled after 5 pm or on week-ends. 4. Avoid getting constipated. Between the surgery and the pain medications, it is common to experience some constipation. Increasing fluid intake and taking a fiber supplement (such as Metamucil, Citrucel, FiberCon, MiraLax, etc) 1-2 times a day regularly will usually help prevent this problem from occurring. A mild laxative (prune juice, Milk of Magnesia, MiraLax, etc) should be taken according to package directions if there are no bowel movements after 48 hours.  5. Watch out for diarrhea. If you have many loose bowel movements, simplify your diet to bland foods & liquids for a few days. Stop any stool softeners and decrease your fiber supplement. Switching to mild anti-diarrheal medications (Kayopectate, Pepto Bismol) can help. If this worsens or does not improve, please call us. 6. Wash / shower every day. You may shower over the dressings as they are waterproof. Continue to shower over incision(s) after the dressing is off. 7. ACTIVITIES as tolerated:  1. You may resume regular (light) daily activities beginning the next day--such as daily self-care, walking, climbing stairs--gradually increasing activities as tolerated. If you can walk 30 minutes without difficulty, it is safe to try more intense activity such as jogging, treadmill, bicycling, low-impact aerobics, swimming, etc. 2. Save the most intensive and strenuous activity for last such as sit-ups, heavy lifting, contact sports, etc Refrain from any heavy lifting or straining until you are off narcotics for pain control.  3. DO NOT PUSH THROUGH PAIN. Let pain be your guide: If it hurts to do something, don't do it. Pain  is your body warning you to avoid that activity for another week until the pain goes down. 4. You may drive when you are no longer taking prescription pain medication, you can comfortably wear a seatbelt, and you can safely maneuver your car and apply brakes. 5. You may  have sexual intercourse when it is comfortable.  8. FOLLOW UP in our office  1. Please call [facility] at [phone] to set up an appointment to see your surgeon in the office for a follow-up appointment approximately 2-3 weeks after your surgery. 2. Make sure that you call for this appointment the day you arrive home to insure a convenient appointment time.      10. IF YOU HAVE DISABILITY OR FAMILY LEAVE FORMS, BRING THEM TO THE               OFFICE FOR PROCESSING.   WHEN TO CALL us [phone]:  1. Poor pain control 2. Reactions / problems with new medications (rash/itching, nausea, etc)  3. Fever over 101.5 F (38.5 C) 4. Inability to urinate 5. Nausea and/or vomiting 6. Worsening swelling or bruising 7. Continued bleeding from incision. 8. Increased pain, redness, or drainage from the incision  The clinic staff is available to answer your questions during regular business hours (8:30am-5pm). Please don't hesitate to call and ask to speak to one of our nurses for clinical concerns.  If you have a medical emergency, go to the nearest emergency room or call 911.  A surgeon from [facility] is always on call at the [facility], [place], [place], [place], [place] [address] ?  MAIN: [phone] ? TOLL FREE: [phone] ?  FAX [phone]  [email or web address]   How to Care for a Feeding Tube  A feeding tube is a tube used to give medicine, water, and liquid food. A person may have this tube if she or he has trouble swallowing or cannot take food or medicine. Supplies needed to care for the tube site:  Clean gloves.  Clean washcloth, gauze pads, or soft paper towel.  Cotton swabs.  A skin barrier ointment or cream, such as petroleum jelly.  Soap and water.  Pre-cut foam pads or gauze (for around the tube).  Tube tape.  An anchoring device (optional). How to care for the tube site  1. Have all  supplies ready and close to you. 2. Wash your hands. 3. Put on gloves. 4. Change any pad or gauze near the tube if: ? It is dirty. ? It is wet. ? It has been there for more than one day. 5. Check the skin around the tube. Call the doctor if you see any of these: ? Red skin. ? A rash. ? Swelling. ? Leaking fluid. ? Extra skin. 6. Dip the gauze and cotton swabs in water and soap. 7. Use the cotton swabs to wipe the skin that is closest to the tube. 8. Use the gauze pads to wipe the rest of the skin near the tube. 9. Rinse with water. 10. Dry the area with a clean washcloth, dry gauze pad, or soft paper towel. 11. If the skin is red, use a cotton swab to put on a skin barrier cream or ointment. Put it on by making little circles. Do not apply antibiotic ointments at the tube site. 12. Put a new pre-cut foam pad or gauze around the tube. If there is no fluid at the tube site,  you do not need a pad or gauze. 13. Tape down the edges. 14. Use tape or an anchoring device to attach the tube to the skin. Do this for comfort or as told. Each time you use tape, put it in a different place. 15. Sit the person up. 16. Throw away used supplies. 17. Take off your gloves. 18. Wash your hands. Supplies needed to flush a feeding tube:  Clean gloves.  A clean 60 mL syringe that connects to the feeding tube.  A towel.  Germ-free (sterile) or purified water. Follow these rules: ? Use germ-free water if:  Your body's defense system (immune system) is weak and you have trouble getting better from infections (are immunocompromised).  You do not know how many chemicals are in your water. ? Do not use water from lakes or other bodies of water unless you treat it or filter it first. ? To make drinking water pure by boiling:  Boil water for 1 minute or longer. Keep a lid over the water while it boils.  Let the water cool off to room temperature before you use it. How to flush a feeding tube 1. Have  all supplies ready and close to you. 2. Wash your hands. 3. Put on gloves. 4. Pull 30 mL of water into the syringe. 5. Before you push water into (flush) the tube, put the towel under the tube to catch any fluid leaks. 6. Bend (kink) the feeding tube while you do one of these things: ? Disconnect it from the feeding-bag tubing. ? Take off the cap at the end of the tube. 7. Put the tip of the syringe into the end of the feeding tube. 8. Stop bending the tube. 9. Use the syringe to slowly put the water in the tube. If the water will not go in the tube: ? Have the person lie on his or her left side. ? Try putting the water in the tube again. ? Do not push hard to make the water go in. 10. Take out the syringe and put the cap on the tube. 11. Throw away used supplies. 12. Take off your gloves. 13. Wash your hands. Follow these instructions at home: Caring for the tube  If the person has a foam pad or gauze near the tube, change it: ? Every day. ? When it is dirty. ? When it is wet.  Do not put antibiotic ointments by the tube. Flushing the tube  Do not use a syringe that is smaller than 60 mL.  Flush the tube at all of these times: ? Before you give medicine. ? Between medicines. ? After the person gets the last medicine before a feeding.  Do not mix medicines with formula. Do not mix medicines with other medicines.  Completely flush medicines through the tube. That way, they will not mix with formula. Contact a doctor if:  The tube gets blocked or clogged.  You find any of these on the skin around the tube site: ? Red skin. ? A rash. ? Swelling. ? Leaking fluid. ? Extra skin. Summary  A feeding tube is a tube used to give medicine, water, and liquid food. A person may have this tube if she or he has trouble swallowing or cannot take food or medicine.  Follow the doctor's instructions to care for the tube site and flush the tube every day.  Contact a doctor if the  tube gets blocked or clogged. This information is not intended to replace  advice given to you by your health care provider. Make sure you discuss any questions you have with your health care provider. Document Revised: 03/25/2017 Document Reviewed: 05/21/2016 Elsevier Patient Education  [place].   Tube feeding recommendations: -Continue gravity feeds of Osmolite 1.5. Advance to goal rate of 237 ml TID via G-tube. Can begin with 120 ml, 6 times daily until can tolerate higher volumes.  -Provide 30 ml Prostat (or equivalent alternative) TID via PO or tube -Free water flush with 20 ml before and after each bolus -Provides 1365 kcals (91% of needs), 89g protein and 663 ml H2O

## 2019-05-10 NOTE — TOC Progression Note (Signed)
Transition of Care [facility]) - Progression Note    Patient Details  Name: Angie [name] MRN: [id number] Date of Birth: [birth year]  [facility] CM/SW Contact  Carles Collet, RN Phone Number: 05/10/2019, 4:11 PM  Clinical Narrative:    Spoke w patient, she states that tube feed supplies have been delivered to her house. She requested RW to room, ordered and requested. Notified [facility] that patient will DC tomorrow.     Expected Discharge Plan: [place] Barriers to Discharge: Continued Medical Work up  Expected Discharge Plan and Services Expected Discharge Plan: [place]   Discharge Planning Services: CM Consult Post Acute Care Choice: Durable Medical Equipment, Home Health                   DME Arranged: Walker rolling, Tube feeding DME Agency: [employer] Date DME Agency Contacted: 05/10/19 Time DME Agency Contacted: [phone] Representative spoke with at DME Agency: zack HH Arranged: RN, PT, OT [place] Agency: [place] ([place]) Date [place]: 05/10/19 Time [employer]: 1611 Representative spoke with at [place]: [place] ([place]) Interventions    Readmission Risk Interventions No flowsheet data found.

## 2019-05-10 NOTE — Progress Notes (Signed)
Physical Therapy Treatment Patient Details Name: Angie [name] MRN: [id number] DOB: May 17, 1965 Today's Date: 05/10/2019    History of Present Illness Angie [name] is a 54 y.o. female with history of gastric bypass surgery prior to which she had a history of diabetes mellitus CKD stage III, chronic anemia, chronic malnutrition with history of esophageal strictures, bipolar disorder and chronic pain, chronic anemia. Patient admitted for unresponsive episode secondary to hypoglycemia in the setting of failure to thrive.    PT Comments    Pt OOB in recliner upon arrival of PT, agreeable to session with focus on progressing mobility in anticipation for d/c home. The pt was able to demo improvement in stability with functional mobility as well as improved endurance. The pt was able ambulate 250 ft as well as navigate 2 steps with minA for safety. The pt reports she has no further concerns regarding mobility at this time, and all questions were answered regarding stair navigation and mobility at home. The pt continues to demo some limitations in endurance and stability despite improvements, and therefore will continue to benefit from skilled PT acutely as well as with HHPT following d/c.     Follow Up Recommendations  Home health PT     Equipment Recommendations  Wheelchair (measurements PT);Rolling walker with 5" wheels    Recommendations for Other Services       Precautions / Restrictions Precautions Precautions: Fall Restrictions Weight Bearing Restrictions: No    Mobility  Bed Mobility Overal bed mobility: Needs Assistance Bed Mobility: Supine to Sit;Sit to Supine     Supine to sit: Modified independent (Device/Increase time) Sit to supine: Modified independent (Device/Increase time)   General bed mobility comments: Pt moves slowly but with good safety able to move BLE without assist of UE  Transfers Overall transfer level: Needs assistance Equipment used: Rolling walker  (2 wheeled);None Transfers: Sit to/from [employer] to Stand: Supervision Stand pivot transfers: Supervision       General transfer comment: pt able to transfer to [facility] without AD with min-guard A. Sit<>stand to RW with increased effort and cues for posture  Ambulation/Gait Ambulation/Gait assistance: Supervision Gait Distance (Feet): 250 Feet Assistive device: Rolling walker (2 wheeled) Gait Pattern/deviations: Step-through pattern;Decreased stride length;Trunk flexed Gait velocity: decreased Gait velocity interpretation: <1.31 ft/sec, indicative of household ambulator General Gait Details: pt with improved DOE, able to complete 250 ft with navigation of 2 stairs without standing or seated rest break. slight trunk flex for comfort   Stairs Stairs: Yes Stairs assistance: Min assist Stair Management: One rail Right Number of Stairs: 2 General stair comments: Pt was able to demo navigation of 2 steps with HHA of one and 1 rail to demo how she and her fiance can complete stairs at home.   Wheelchair Mobility    Modified Rankin (Stroke Patients Only)       Balance Overall balance assessment: Needs assistance Sitting-balance support: No upper extremity supported;Feet supported Sitting balance-Leahy Scale: Normal     Standing balance support: During functional activity;No upper extremity supported Standing balance-Leahy Scale: Fair Standing balance comment: pt able to stand in static position without support but needs UE support for safety with mobility                            Cognition Arousal/Alertness: Awake/alert Behavior During Therapy: WFL for tasks assessed/performed Overall Cognitive Status: Within Functional Limits for tasks assessed  Exercises      General Comments        Pertinent Vitals/Pain Pain Assessment: No/denies pain Pain Intervention(s): Limited activity  within patient's tolerance;Monitored during session    Home Living                      Prior Function            PT Goals (current goals can now be found in the care plan section) Acute Rehab PT Goals Patient Stated Goal: return home PT Goal Formulation: With patient Time For Goal Achievement: 05/21/19 Potential to Achieve Goals: Good Progress towards PT goals: Progressing toward goals    Frequency    Min 3X/week      PT Plan Current plan remains appropriate    Co-evaluation              AM-PAC PT "6 Clicks" Mobility   Outcome Measure  Help needed turning from your back to your side while in a flat bed without using bedrails?: None Help needed moving from lying on your back to sitting on the side of a flat bed without using bedrails?: None Help needed moving to and from a bed to a chair (including a wheelchair)?: A Little Help needed standing up from a chair using your arms (e.g., wheelchair or bedside chair)?: A Little Help needed to walk in hospital room?: A Little Help needed climbing 3-5 steps with a railing? : A Little 6 Click Score: 20    End of Session Equipment Utilized During Treatment: Gait belt Activity Tolerance: Patient tolerated treatment well Patient left: with call bell/phone within reach;in bed Nurse Communication: Mobility status PT Visit Diagnosis: Unsteadiness on feet (R26.81);Muscle weakness (generalized) (M62.81)     Time: 1331-1400 PT Time Calculation (min) (ACUTE ONLY): 29 min  Charges:  $Gait Training: 23-37 mins                     Karma Ganja, PT, DPT   Acute Rehabilitation Department Pager #: [phone]   Otho Bellows 05/10/2019, 2:15 PM

## 2019-05-11 ENCOUNTER — Encounter (HOSPITAL_COMMUNITY): Payer: Self-pay | Admitting: Emergency Medicine

## 2019-05-11 ENCOUNTER — Other Ambulatory Visit: Payer: Self-pay

## 2019-05-11 ENCOUNTER — Emergency Department (HOSPITAL_COMMUNITY)
Admission: EM | Admit: 2019-05-11 | Discharge: 2019-05-11 | Discharge status: Against Medical Advice | Payer: Medicare Other | Source: Home / Self Care | Attending: Emergency Medicine | Admitting: Emergency Medicine

## 2019-05-11 DIAGNOSIS — T50901A Poisoning by unspecified drugs, medicaments and biological substances, accidental (unintentional), initial encounter: Secondary | ICD-10-CM | POA: Insufficient documentation

## 2019-05-11 DIAGNOSIS — E162 Hypoglycemia, unspecified: Secondary | ICD-10-CM | POA: Insufficient documentation

## 2019-05-11 DIAGNOSIS — K9423 Gastrostomy malfunction: Secondary | ICD-10-CM | POA: Diagnosis not present

## 2019-05-11 DIAGNOSIS — Z79899 Other long term (current) drug therapy: Secondary | ICD-10-CM | POA: Insufficient documentation

## 2019-05-11 DIAGNOSIS — Z87891 Personal history of nicotine dependence: Secondary | ICD-10-CM | POA: Insufficient documentation

## 2019-05-11 LAB — CBC
HCT: 30.9 % — ABNORMAL LOW (ref 36.0–46.0)
Hemoglobin: 10.4 g/dL — ABNORMAL LOW (ref 12.0–15.0)
MCH: 32.5 pg (ref 26.0–34.0)
MCHC: 33.7 g/dL (ref 30.0–36.0)
MCV: 96.6 fL (ref 80.0–100.0)
Platelets: 133 10*3/uL — ABNORMAL LOW (ref 150–400)
RBC: 3.2 MIL/uL — ABNORMAL LOW (ref 3.87–5.11)
RDW: 18.5 % — ABNORMAL HIGH (ref 11.5–15.5)
WBC: 5.8 10*3/uL (ref 4.0–10.5)
nRBC: 0 % (ref 0.0–0.2)

## 2019-05-11 LAB — BASIC METABOLIC PANEL
Anion gap: 5 (ref 5–15)
BUN: 14 mg/dL (ref 6–20)
CO2: 25 mmol/L (ref 22–32)
Calcium: 7.9 mg/dL — ABNORMAL LOW (ref 8.9–10.3)
Chloride: 108 mmol/L (ref 98–111)
Creatinine, Ser: 0.82 mg/dL (ref 0.44–1.00)
GFR calc Af Amer: >60 mL/min (ref >60)
GFR calc non Af Amer: >60 mL/min (ref >60)
Glucose, Bld: 64 mg/dL — ABNORMAL LOW (ref 70–99)
Potassium: 4.3 mmol/L (ref 3.5–5.1)
Sodium: 138 mmol/L (ref 135–145)

## 2019-05-11 LAB — GLUCOSE, CAPILLARY
Glucose-Capillary: 70 mg/dL (ref 70–99)
Glucose-Capillary: 73 mg/dL (ref 70–99)
Glucose-Capillary: 148 mg/dL — ABNORMAL HIGH (ref 70–99)

## 2019-05-11 LAB — MAGNESIUM: Magnesium: 2 mg/dL (ref 1.7–2.4)

## 2019-05-11 LAB — PHOSPHORUS: Phosphorus: 2.8 mg/dL (ref 2.5–4.6)

## 2019-05-11 LAB — CBG MONITORING, ED: Glucose-Capillary: 67 mg/dL — ABNORMAL LOW (ref 70–99)

## 2019-05-11 MED ORDER — DEXTROSE 50 % IV SOLN: 25.0000 mL | Freq: Once | INTRAVENOUS | Status: DC

## 2019-05-11 MED ORDER — PANTOPRAZOLE SODIUM 40 MG PO TBEC: 40.0000 mg | DELAYED_RELEASE_TABLET | Freq: Every day | ORAL | 3 refills | Status: DC

## 2019-05-11 MED ORDER — OXYCODONE HCL 10 MG PO TABS: 10.0000 mg | ORAL_TABLET | Freq: Four times a day (QID) | ORAL | 0 refills | Status: DC | PRN

## 2019-05-11 NOTE — ED Notes (Signed)
Patient refused any care. This Probation officer made MD Melina Copa aware.

## 2019-05-11 NOTE — Discharge Summary (Signed)
[facility] Discharge Summary   Patient ID: Angie [name] MRN: [id number] DOB/AGE: 1965/08/15 54 y.o.  Admit date: 04/29/2019 Discharge date: 05/11/2019  Admitting Diagnosis: Hypoglycemia hypothermia Weakness  Discharge Diagnosis Patient Active Problem List   Diagnosis Date Noted  . Failure to thrive in adult   . History of gastric bypass   . Nausea & vomiting   . Hypothermia 04/29/2019  . 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 Symptomatic Anemia 04/25/2017  . Symptomatic anemia 04/25/2017  . Loss of weight   . Dysphagia   . S/P percutaneous endoscopic gastrostomy (PEG) tube placement ([place]) 09/06/2016  . At risk for adverse drug event 08/25/2016  . Malnutrition following gastrointestinal surgery 08/16/2016  . Anastomotic ulcer 07/21/2016  . Transient alteration of awareness   . Alcohol use 04/27/2016  . Chronic pain syndrome 04/27/2016  . Iron deficiency anemia 04/27/2016  . B12 deficiency 04/27/2016  . Biliary anastomotic occlusion   . Hypoglycemia 04/22/2016  . Syncope 04/22/2016  . Protein-calorie malnutrition, severe 04/22/2016  . Intractable nausea and vomiting   . Abdominal pain, chronic, epigastric   . Hypotension 02/01/2016  . Hyponatremia 02/01/2016  . Subacromial impingement of left shoulder 07/06/2013  . Trochanteric bursitis of left hip 05/11/2013  . Arthritis 05/16/2012  . S/P gastric bypass 05/16/2012    Consultants Gastroenterology Medicine  Imaging: No results found.  Procedures Dr. Kieth Brightly (05/04/19) - laparoscopic gastrostomy tube insertion  HPI: Angie [name] is a 54 y.o. female that was admitted for hypoglycemia, failure to thrive, and malnutrition.  Patient has a history of of gastric bypass for weight loss in 2007.  The patient suffered from weight loss and malnutrition over the last several years after struggling with stenosis related to ulcer at the gastrojejunostomy site  requiring multiple endoscopies and dilations. She ultimately underwent a laparoscopic revision of gastrojejunostomy and insertion of gastrostomy tube to remnant stomach in 2019 by Dr. Kieth Brightly.  G-tube has since been removed. She reports over the last 4-5 months she has been having increasing difficulties with dysphagia, worsening the last 3-4 weeks.  She reports that anytime she eats or drinks she feels it has difficulty going down her throat and usually will have emesis after eating. This includes solids and liquids. On 1/3, she presented to the ED after EMS found patient hypoglycemic with CBG of 23.  She was admitted to medicine and GI consulted.  She underwent a EGD on 1/6 that showed no stricture or stenosis and a small gastric remnant. She underwent upper GI yesterday that showed slow clearance of the esophagus with esophageal dysmotility, no stricture.  This also showed small gastric pouch with immediate emptying into the small bowel.  GI asked for general surgery to see.  Patient seen with Dr. Kieth Brightly.   Hospital Course:  Workup showed hypoglycemia and hypothermia. Patient had CT head and C-spine followed by CT chest and abdomen. CT head and C-spine were unremarkable. CT chest and abdomen was showing third spacing of fluid and hepatic steatosis. Pulmonary nodule also seen.  Patient was started on D10 and admitted. She was seen by gastroenterology who performed an upper endoscopy. 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 She had profound malnutrition and electrolyte imbalance. Upper GIshowed slow clearance of the esophagus with esophageal dysmotility, no stricture. This also showed small gastric pouch with immediate emptying into the small bowel. IR did note feel patients anatomy was amenable to percutaneous gastrostomy  tube placement. It was decided to proceed with procedure listed above. Patient tolerated procedure well and was transferred to the floor.  She was started on TF's. She had some leakage around the G tube. G-tube in good location without internal leak on XRAY study. Leakage improved prior to discharge. Patient was also tolerating a diet. Patient received 1 unit PRBC two different times for low hemoglobin. Hgb improved prior to discharge. Medicine assisted with patients co morbidities. Incidental finding of severe hepatic steatosis and liver nodule was discussed with patient and she will need outpt follow up for these.   On POD#7, the patient was voiding well, tolerating diet and TF's, ambulating well, pain well controlled, vital signs stable, incisions c/d/i and felt stable for discharge home.  Patient will follow up as outlined below and knows to call with questions or concerns.     The [place] Substance controlled database was reviewed prior to prescribing narcotic pain medication to this patient.  Physical Exam: See progress note by Dr. Ninfa Linden on 01/15  Allergies as of 05/11/2019      Reactions   Iron Anaphylaxis   Had acute allergy with tachycardia, attention and generalized itching shortly after receiving nulecit ( iron gluconate) infusion.   Lactose Intolerance (gi)       Medication List    STOP taking these medications   famotidine 40 MG tablet Commonly known as: PEPCID     TAKE these medications   ALPRAZolam 1 MG tablet Commonly known as: XANAX Take 1 tablet (1 mg total) by mouth at bedtime as needed for anxiety. What changed: when to take this   Carafate 1 GM/10ML suspension Generic drug: sucralfate Take 10 mLs by mouth 3 (three) times daily before meals.   cyclobenzaprine 10 MG tablet Commonly known as: FLEXERIL Take 10 mg by mouth 3 (three) times daily.   Gas Relief 40 MG/0.6ML drops Generic drug: simethicone Take 1 mL by mouth every 6 (six) hours as needed for flatulence.   lidocaine 5 % ointment Commonly known as: XYLOCAINE Apply 1 application topically 3 (three) times daily. Apply to  Bilateral shoulders   lisinopril-hydrochlorothiazide 10-12.5 MG tablet Commonly known as: ZESTORETIC Take 1 tablet by mouth daily.   multivitamin with iron-minerals liquid Take 5 mLs by mouth daily.   OLANZapine 2.5 MG tablet Commonly known as: ZYPREXA Take 2.5 mg by mouth daily.   Oxycodone HCl 10 MG Tabs Take 1 tablet (10 mg total) by mouth every 6 (six) hours as needed for severe pain. What changed:   medication strength  how much to take  when to take this  reasons to take this   oxymetazoline 0.05 % nasal spray Commonly known as: AFRIN Place 2 sprays into both nostrils 2 (two) times daily as needed for congestion.   pantoprazole 40 MG tablet Commonly known as: PROTONIX Take 1 tablet (40 mg total) by mouth at bedtime.   QUEtiapine 50 MG tablet Commonly known as: SEROQUEL Take 50 mg by mouth at bedtime.        Follow-up Information    Kinsinger, Arta Bruce, MD. Go on 05/30/2019.   Specialty: General Surgery Why: at 3:10pm. Please arrive 20 min prior to complete paperwork. Please bring photo ID and insurance card Contact information: [place] [address] [phone]        Harvie Junior, MD. Schedule an appointment as soon as possible for a visit in 2 week(s).   Specialty: Family Medicine Why: for follow  up labs  Contact information: [place] [address] [phone]           Signed: [facility] 05/11/2019, 9:25 AM Please see amion for pager for the following: Cristine Polio, & Friday 7:00am - 4:30pm Thursdays 7:00am -11:30am

## 2019-05-11 NOTE — Progress Notes (Signed)
PROGRESS NOTE    Angie [name]  [id number] DOB: 1965/10/24 DOA: 04/29/2019 PCP: Harvie Junior, MD   Brief Narrative:  Angie [name] is a 54 y.o. female with history of gastric bypass surgery, history of diabetes mellitus, CKD stage III, chronic anemia, chronic malnutrition with history of esophageal strictures, bipolar disorder and chronic pain, chronic anemia. Patient was admitted for unresponsive episode secondary to hypoglycemia in the setting of failure to thrive. 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 Patient has gastrostomy tube and is receiving tube feeds. Medical team was consulted for medical management.  Assessment & Plan:   Principal Problem:   Hypoglycemia Active Problems:   S/P gastric bypass   Intractable nausea and vomiting   Biliary anastomotic occlusion   Loss of weight   Hypothermia   Nausea & vomiting   Failure to thrive in adult   History of gastric bypass   Postprandial vomiting/Dysphagia/Failure to thrive History of Roux-en-Y gastric bypass. EGD performed on 1/6 and was significant for tiny gastric remnant. Concern is gastric remnant is too small to allow for proper nutritional absorption so gastrostomy tube was placed by general surgery.  Currently on tube feeds and oral diet. .  Management as per primary team.  hyponatremia, hypomagnesemia, hypophosphatemia, hypokalemia. Improved.   Latest magnesium of 2.0 phosphorus of 2.8, potassium of 4.3.  Recommend periodic monitoring of CBC BMP mag phos as outpatient  Hypothermia Resolved.  Severe hepatic steatosis Noted on MRCP.  Will need to monitor as outpatient.  Distended gallbladder Abdominal ultrasound showed some sludge without gallbladder wall thickening, biliary duct dilation with no mass or calculus seen. MRI significant for common bile duct up to 7 mm.  Rest of the LFTs within normal limits.  No abdominal pain.   Bilateral hydronephrosis, moderate  Hydroureter Resolved.  Noted on initial MRI. Creatinine stable.  Renal ultrasound on 05/02/2018 which showed no evidence of hydronephrosis.  Macrocytic anemia/thrombocytopenia Low folate. Normal/high vitamin B12.  Received blood transfusion of 1 unit on 04/29/2018.  Hemoglobin trended down to 6.2. Status post repeat transfusion.  Hemoglobin today is 10.4. Continue folate/thiamine on discharge..    Haptoglobin within normal limits.  Fecal occult was positive.   No reticulocytosis.  Continue vitamin C supplements.  Monitor CBC closely after discharge.  Thrombocytopenia No evidence of external bleeding.  Closely monitor.  Could be a component of macrocytic anemia. Platelets today of 133 and is improving..  Hypoglycemia   Recurrent and asymptomatic.  On tube feeding.  Latest POC glucose of 140.  Essential hypertension Continue lisinopril HCTZ.  Closely monitor electrolytes as outpatient.  BP is stable at this time.  Bilateral LE edema/Anasarca/ Due to hypoalbuminemia and poor nutrition..  On tube feeds.  Will continue on discharge. albumin of 2.0  Leukopenia Resolved.  CKD stage III Stable.  Latest creatinine of 0.8  Liver lesion Complex cystic lesion of left lobe.  Will need outpatient follow-up.  Generalized weakness   PT recommends home health PT on discharge.  Bipolar disorder/Anxiety -Continue Seroquel, Xanax.  Controlled at this time.  To be continued  Chronic pain syndrome, peripheral neuropathy on oxycodone, continue Lyrica, have increased and recommend the dose of Lyrica to 50 mg twice a day since she still complains of significant burning sensation.  Will need to follow-up with primary care physician as outpatient   DVT prophylaxis: SCD  Code Status: Full code  Family Communication:     Disposition Plan: Discharge plan as per primary team.  Patient is medically stable for disposition.  Physical therapy recommends home health PT on discharge with wheelchair and rolling  walker.  Consider Lyrica  50 mg twice a day on discharge.  Would recommend continuation of thiamine folic acid, vitamin C on discharge.  Patient will need to check CBC, BMP magnesium phosphorus in 3 to 5 days after discharge.   Procedures:    05/02/2019: EGD Impression:               1. Status post Roux-en-Y gastric bypass surgery. No                            stricture or stenosis                           2. Tiny gastric remnant. I wonder if gastric volume                            is so small as to explain her issues with eating                            difficulties, weight loss, and metabolic                            abnormalities. Recommendation:           1. Soft diet                           2. Upper GI series to evaluate gastric remnant size                           3. Ask bariatric surgeons to weigh in on her                            current anatomy to see if she would benefit from                            reversal of her surgery. This could likely be done                            endoscopically.                           4. We will follow   05/04/2019: Laparoscopic gastrostomy tube insertion  PRBC transfusion  Antimicrobials: None  Subjective: Today, patient feels okay.  She states that her burning sensation in the legs have improved.  No nausea vomiting or abdominal pain.    Objective: Vitals:   05/10/19 1900 05/11/19 0412 05/11/19 0616 05/11/19 0740  BP: (!) 123/98 (!) 103/91  103/79  Pulse: (!) 106 (!) 106  (!) 119  Resp: 14 15  17   Temp: 98.8 F (37.1 C)   98.9 F (37.2 C)  TempSrc: Oral   Oral  SpO2: 100% 100%  100%  Weight:   49.1 kg   Height:        Intake/Output Summary (Last 24 hours) at 05/11/2019 0826 Last data filed at 05/11/2019 0742 Gross per  24 hour  Intake 450 ml  Output 800 ml  Net -350 ml   Filed Weights   05/08/19 0500 05/10/19 0500 05/11/19 0616  Weight: 51 kg 53 kg 49.1 kg    Physical Examination: General: Thinly  built, not in obvious distress HENT: Normocephalic, pupils equally reacting to light and accommodation.  No scleral pallor or icterus noted. Oral mucosa is moist.  Chest:  Clear breath sounds.  Diminished breath sounds bilaterally. No crackles or wheezes.  CVS: S1 &S2 heard. No murmur.  Regular rate and rhythm. Abdomen: Soft, nontender, nondistended.  Bowel sounds are heard. Gastrostomy tube in place with dressing.   Extremities: No cyanosis, clubbing or edema.  Peripheral pulses are palpable. Psych: Alert, awake and oriented, normal mood CNS:  No cranial nerve deficits.  Power equal in all extremities.   Skin: Warm and dry.  No rashes noted.   Data Reviewed: I have personally reviewed following labs and imaging studies  CBC: Recent Labs  Lab 05/07/19 0305 05/08/19 0238 05/09/19 0419 05/10/19 0205 05/11/19 0329  WBC 6.1 5.0 6.0 6.1 5.8  HGB 7.0* 6.2* 8.9* 10.3* 10.4*  HCT 21.4* 18.5* 26.6* 31.0* 30.9*  MCV 100.0 102.8* 96.4 95.4 96.6  PLT 99* 83* 95* 108* [id number]*   Basic Metabolic Panel: Recent Labs  Lab 05/06/19 0321 05/06/19 0321 05/07/19 0305 05/08/19 0238 05/08/19 1527 05/09/19 0419 05/10/19 0205 05/11/19 0329  NA 141  --  140  --  138  --  138 138  K 3.8  --  4.1  --  4.3  --  4.5 4.3  CL 115*  --  114*  --  110  --  109 108  CO2 14*  --  20*  --  22  --  21* 25  GLUCOSE 98  --  72  --  64*  --  131* 64*  BUN 22*  --  18  --  12  --  10 14  CREATININE 1.16*  --  1.02*  --  0.77  --  0.82 0.82  CALCIUM 8.1*  --  7.8*  --  7.8*  --  8.0* 7.9*  MG 2.3   < > 1.9 1.7  --  1.7 1.6* 2.0  PHOS 4.0   < > 3.9 2.9  --  3.2 2.6 2.8   < > = values in this interval not displayed.   GFR: Estimated Creatinine Clearance: 57.6 mL/min (by C-G formula based on SCr of 0.82 mg/dL). Liver Function Tests: Recent Labs  Lab 05/08/19 1527 05/10/19 0205  AST 36 24  ALT 33 29  ALKPHOS 132* 151*  BILITOT 0.7 1.1  PROT 4.6* 5.0*  ALBUMIN 1.8* 2.0*   No results for input(s): LIPASE,  AMYLASE in the last 168 hours. No results for input(s): AMMONIA in the last 168 hours. Coagulation Profile: No results for input(s): INR, PROTIME in the last 168 hours. Cardiac Enzymes: No results for input(s): CKTOTAL, CKMB, CKMBINDEX, TROPONINI in the last 168 hours. BNP (last 3 results) No results for input(s): PROBNP in the last 8760 hours. HbA1C: No results for input(s): HGBA1C in the last 72 hours. CBG: Recent Labs  Lab 05/10/19 1630 05/10/19 2014 05/11/19 0413 05/11/19 0558 05/11/19 0738  GLUCAP 117* 73 70 73 148*   Lipid Profile: No results for input(s): CHOL, HDL, LDLCALC, TRIG, CHOLHDL, LDLDIRECT in the last 72 hours. Thyroid Function Tests: No results for input(s): TSH, T4TOTAL, FREET4, T3FREE, THYROIDAB in the last 72 hours. Anemia Panel: Recent Labs  05/08/19 1527  RETICCTPCT 1.1   Sepsis Labs: No results for input(s): PROCALCITON, LATICACIDVEN in the last 168 hours.  No results found for this or any previous visit (from the past 240 hour(s)).    Radiology Studies: No results found. Scheduled Meds: . vitamin C  500 mg Per Tube BID  . cholecalciferol  3,000 Units Per Tube Daily  . feeding supplement (OSMOLITE 1.5 CAL)  120 mL Per Tube 6 X Daily  . feeding supplement (PRO-STAT SUGAR FREE 64)  30 mL Per Tube TID  . folic acid  1 mg Per Tube Daily  . free water  40 mL Per Tube 5 X Daily  . lisinopril  20 mg Oral Daily   Or  . hydrochlorothiazide  12.5 mg Oral Daily  . influenza vac split quadrivalent PF  0.5 mL Intramuscular Tomorrow-1000  . methocarbamol  500 mg Oral TID  . multivitamin  1 tablet Oral Daily  . OLANZapine  2.5 mg Oral Daily  . oxyCODONE  10 mg Oral Q12H  . pantoprazole  40 mg Oral QHS  . pregabalin  50 mg Oral BID  . QUEtiapine  50 mg Oral QHS  . thiamine  100 mg Per Tube Daily  . vitamin A  10,000 Units Per Tube Daily  . vitamin E  400 Units Per Tube Daily   Continuous Infusions: . lactated ringers Stopped (05/02/19 1216)  .  lactated ringers Stopped (05/08/19 0710)     LOS: 11 days   Flora Lipps, MD [facility] 05/11/2019,

## 2019-05-11 NOTE — ED Provider Notes (Signed)
[facility] DEPT Provider Note   CSN: [id number] Arrival date & time: 05/11/19  1712     History Chief Complaint  Patient presents with  . Drug Overdose    Angie [name] is a 54 y.o. female.  She has a history of Roux-en-Y, failure to thrive, chronic pain.  She was just discharged from the hospital today after getting a G-tube placed.  Per EMS her husband found her unresponsive at around 4:20 PM today.  Possibly had taken her pain medicine.  She was given Narcan and became more responsive.  Patient here says she was just tired from not having slept very well in the hospital and was fine.  Blood sugar was noted to be low at 69 here.  She is asking to get up to go to the bathroom.  Denies other complaints.  Denies any self harm intent.  The history is provided by the patient and the EMS personnel.  Drug Overdose This is a new problem. The current episode started 1 to 2 hours ago. The problem has been resolved. Pertinent negatives include no chest pain, no abdominal pain, no headaches and no shortness of breath. Nothing aggravates the symptoms. Relieved by: narcan. The treatment provided significant relief.       Past Medical History:  Diagnosis Date  . Abnormal Pap smear   . Alcohol abuse   . Anemia    IDA  . Anxiety    Takes xanax  . Arthritis    left hip/knees, lower spine  . Bipolar disorder ([place])   . Blood transfusion without reported diagnosis last done 04-25-17  . Bulging lumbar disc   . Bursitis    left shoulder  . Chronic lower back pain   . Cut    right middle finger with knife small cut healing pt instructed to keep clean and dry no redness or drainage  . Depression   . Diverticulitis   . Fibromyalgia   . GERD (gastroesophageal reflux disease)   . Hypertension    none since weight loss  . Lactose intolerance in adult 2017  . Migraines    "weekly @ least" (04/26/2016)  . Seizures ([place])    one Dec. 2017 due to throat closing  .  Type II diabetes mellitus ([place])    "before the gastric bypass" (04/26/2016) states she no longer has diabetes    Patient Active Problem List   Diagnosis Date Noted  . Failure to thrive in adult   . History of gastric bypass   . Nausea & vomiting   . Hypothermia 04/29/2019  . 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 Symptomatic Anemia 04/25/2017  . Symptomatic anemia 04/25/2017  . Loss of weight   . Dysphagia   . S/P percutaneous endoscopic gastrostomy (PEG) tube placement ([place]) 09/06/2016  . At risk for adverse drug event 08/25/2016  . Malnutrition following gastrointestinal surgery 08/16/2016  . Anastomotic ulcer 07/21/2016  . Transient alteration of awareness   . Alcohol use 04/27/2016  . Chronic pain syndrome 04/27/2016  . Iron deficiency anemia 04/27/2016  . B12 deficiency 04/27/2016  . Biliary anastomotic occlusion   . Hypoglycemia 04/22/2016  . Syncope 04/22/2016  . Protein-calorie malnutrition, severe 04/22/2016  . Intractable nausea and vomiting   . Abdominal pain, chronic, epigastric   . Hypotension 02/01/2016  . Hyponatremia 02/01/2016  . Subacromial impingement of left shoulder 07/06/2013  . Trochanteric bursitis of left hip 05/11/2013  . Arthritis 05/16/2012  .  S/P gastric bypass 05/16/2012    Past Surgical History:  Procedure Laterality Date  . BALLOON DILATION N/A 05/27/2016   Procedure: BALLOON DILATION;  Surgeon: Doran Stabler, MD;  Location: [facility];  Service: Gastroenterology;  Laterality: N/A;  . BALLOON DILATION N/A 04/21/2017   Procedure: BALLOON DILATION;  Surgeon: Doran Stabler, MD;  Location: [facility];  Service: Gastroenterology;  Laterality: N/A;  . CERVICAL CONE BIOPSY    . [place]; 1996; 1998; 2014  . CESAREAN SECTION WITH BILATERAL TUBAL LIGATION Bilateral 10/28/2012   Procedure: Repeat cesarean section with delivery of baby boy at 8. Apgars 8/9.  BILATERAL TUBAL LIGATION;   Surgeon: Florian Buff, MD;  Location: [facility];  Service: Obstetrics;  Laterality: Bilateral;  . DILATION AND CURETTAGE OF UTERUS    . ESOPHAGOGASTRODUODENOSCOPY N/A 05/27/2016   Procedure: ESOPHAGOGASTRODUODENOSCOPY (EGD);  Surgeon: Doran Stabler, MD;  Location: [facility];  Service: Gastroenterology;  Laterality: N/A;  . ESOPHAGOGASTRODUODENOSCOPY (EGD) WITH PROPOFOL N/A 04/23/2016   Procedure: ESOPHAGOGASTRODUODENOSCOPY (EGD) WITH PROPOFOL;  Surgeon: Doran Stabler, MD;  Location: [facility];  Service: Endoscopy;  Laterality: N/A;  . ESOPHAGOGASTRODUODENOSCOPY (EGD) WITH PROPOFOL N/A 01/03/2017   Procedure: ESOPHAGOGASTRODUODENOSCOPY (EGD) WITH PROPOFOL;  Surgeon: Doran Stabler, MD;  Location: [facility];  Service: Gastroenterology;  Laterality: N/A;  . ESOPHAGOGASTRODUODENOSCOPY (EGD) WITH PROPOFOL N/A 04/21/2017   Procedure: ESOPHAGOGASTRODUODENOSCOPY (EGD) WITH PROPOFOL;  Surgeon: Doran Stabler, MD;  Location: [facility];  Service: Gastroenterology;  Laterality: N/A;  . ESOPHAGOGASTRODUODENOSCOPY (EGD) WITH PROPOFOL N/A 05/02/2019   Procedure: ESOPHAGOGASTRODUODENOSCOPY (EGD) WITH PROPOFOL;  Surgeon: Irene Shipper, MD;  Location: [facility];  Service: Endoscopy;  Laterality: N/A;  . GASTROSTOMY N/A 08/16/2016   Procedure: LAPAROSCOPIC INSERTION OF GASTROSTOMY TUBE IN REMNANT STOMACH;  Surgeon: Arta Bruce Kinsinger, MD;  Location: [facility];  Service: General;  Laterality: N/A;  . GASTROSTOMY N/A 05/16/2017   Procedure: Replacement of  GASTROSTOMY TUBE;  Surgeon: Kieth Brightly Arta Bruce, MD;  Location: [facility];  Service: General;  Laterality: N/A;  . LAPAROSCOPIC INSERTION GASTROSTOMY TUBE Left 05/04/2019   Procedure: LAPAROSCOPIC INSERTION GASTROSTOMY TUBE;  Surgeon: Kieth Brightly Arta Bruce, MD;  Location: [facility];  Service: General;  Laterality: Left;  . LAPAROSCOPIC REVISION OF GASTROJEJUNOSTOMY Left 05/16/2017   Procedure: LAPAROSCOPIC REVISION OF GASTROJEJUNOSTOMY;  Surgeon:  Kinsinger, Arta Bruce, MD;  Location: [facility];  Service: General;  Laterality: Left;  . ROUX-EN-Y GASTRIC BYPASS  2007  . TUBAL LIGATION  2014     OB History    Gravida  6   Para  4   Term  4   Preterm  0   AB  2   Living  4     SAB  2   TAB      Ectopic      Multiple      Live Births  4           Family History  Problem Relation Age of Onset  . Diabetes Father   . Heart disease Father   . Depression Maternal Grandmother   . Heart disease Maternal Grandfather   . Depression Paternal Grandmother   . Colon cancer Paternal Grandfather   . Pancreatic cancer Paternal Grandfather     Social History   Tobacco Use  . Smoking status: Former Smoker    Packs/day: 0.50    Years: 4.00    Pack years: 2.00    Types: Cigarettes  Quit date: 03/16/2012    Years since quitting: 7.1  . Smokeless tobacco: Never Used  Substance Use Topics  . Alcohol use: Yes    Alcohol/week: 17.0 standard drinks    Types: 6 Glasses of wine, 11 Shots of liquor per week    Comment: alcohol tx none x 1 year  . Drug use: No    Home Medications Prior to Admission medications   Medication Sig Start Date End Date Taking? Authorizing Provider  ALPRAZolam Duanne Moron) 1 MG tablet Take 1 tablet (1 mg total) by mouth at bedtime as needed for anxiety. Patient taking differently: Take 1 mg by mouth 3 (three) times daily.  08/27/16   Lauree Chandler, NP  CARAFATE 1 GM/10ML suspension Take 10 mLs by mouth 3 (three) times daily before meals. 01/04/18   [provider]  cyclobenzaprine (FLEXERIL) 10 MG tablet Take 10 mg by mouth 3 (three) times daily. 12/28/17   [provider]  GAS RELIEF 20 MG/0.3ML drops Take 1 mL by mouth every 6 (six) hours as needed for flatulence.  11/25/17   [provider]  lidocaine (XYLOCAINE) 5 % ointment Apply 1 application topically 3 (three) times daily. Apply to Bilateral shoulders 05/11/16   [provider]  lisinopril-hydrochlorothiazide  (ZESTORETIC) 10-12.5 MG tablet Take 1 tablet by mouth daily. 04/16/19   [provider]  Multiple Vitamins-Minerals (MULTIVITAMIN WITH IRON-MINERALS) liquid Take 5 mLs by mouth daily.    [provider]  OLANZapine (ZYPREXA) 2.5 MG tablet Take 2.5 mg by mouth daily. 12/25/17   [provider]  oxyCODONE 10 MG TABS Take 1 tablet (10 mg total) by mouth every 6 (six) hours as needed for severe pain. 05/11/19   Focht, Fraser Din, PA  oxymetazoline (AFRIN) 0.05 % nasal spray Place 2 sprays into both nostrils 2 (two) times daily as needed for congestion.    [provider]  pantoprazole (PROTONIX) 40 MG tablet Take 1 tablet (40 mg total) by mouth at bedtime. 05/11/19   Focht, Fraser Din, PA  QUEtiapine (SEROQUEL) 50 MG tablet Take 50 mg by mouth at bedtime. 04/06/19   [provider]    Allergies    Iron and Lactose intolerance (gi)  Review of Systems   Review of Systems  Constitutional: Positive for fatigue. Negative for fever.  HENT: Negative for sore throat.   Eyes: Negative for visual disturbance.  Respiratory: Negative for shortness of breath.   Cardiovascular: Negative for chest pain.  Gastrointestinal: Negative for abdominal pain.  Genitourinary: Negative for dysuria.  Musculoskeletal: Negative for joint swelling.  Skin: Negative for rash.  Neurological: Negative for headaches.    Physical Exam Updated Vital Signs BP 97/65 (BP Location: Left Arm)   Pulse (!) 108   Temp (!) 97.5 F (36.4 C) (Oral)   Resp 16   SpO2 98%   Physical Exam Vitals and nursing note reviewed.  Constitutional:      General: She is not in acute distress.    Appearance: She is well-developed. She is cachectic.  HENT:     Head: Normocephalic and atraumatic.  Eyes:     Conjunctiva/sclera: Conjunctivae normal.  Cardiovascular:     Rate and Rhythm: Normal rate and regular rhythm.     Heart sounds: No murmur.  Pulmonary:     Effort: Pulmonary effort is normal. No  respiratory distress.     Breath sounds: Normal breath sounds.  Abdominal:     Palpations: Abdomen is soft.     Tenderness:  There is no abdominal tenderness.  Musculoskeletal:        General: No deformity or signs of injury. Normal range of motion.     Cervical back: Neck supple.  Skin:    General: Skin is warm and dry.  Neurological:     General: No focal deficit present.     Mental Status: She is alert.     ED Results / Procedures / Treatments   Labs (all labs ordered are listed, but only abnormal results are displayed) Labs Reviewed  CBG MONITORING, ED - Abnormal; Notable for the following components:      Result Value   Glucose-Capillary 67 (*)    All other components within normal limits  CBG MONITORING, ED    EKG None  Radiology No results found.  Procedures Procedures (including critical care time)  Medications Ordered in ED Medications - No data to display  ED Course  I have reviewed the triage vital signs and the nursing notes.  Pertinent labs & imaging results that were available during my care of the patient were reviewed by me and considered in my medical decision making (see chart for details).  Clinical Course as of May 11 1028  Fri May 11, 5847  [id number] 54 year old female with multiple medical problems here after being unresponsive at home.  Given Narcan with improvement in mental status.  Here patient is alert but has a low blood sugar of 67.  Unclear if she is was to be taking anything by mouth so we'll give her some IV dextrose along with some orange juice through her G-tube.  Will observe for any further somnolence.  Differential includes overmedication, tiredness, hypoglycemia.   [MB]  [id number] Informed by the nurse that the patient is requesting discharge.   [MB]  1750 Patient eating crackers and peanut butter.  She said [facility] her fianc would pick her up.  She does not want any lab work.   [MB]    Clinical Course User Index [MB] Hayden Rasmussen, MD   MDM Rules/Calculators/A&P                       Final Clinical Impression(s) / ED Diagnoses Final diagnoses:  Medication overdose, accidental or unintentional, initial encounter    Rx / DC Orders ED Discharge Orders    None       Hayden Rasmussen, MD 05/12/19 1030

## 2019-05-11 NOTE — TOC Transition Note (Signed)
Transition of Care [facility]) - CM/SW Discharge Note   Patient Details  Name: Angie [name] MRN: [id number] Date of Birth: [birth year]  Transition of Care [facility]) CM/SW Contact:  Pollie Friar, RN Phone Number: 05/11/2019, 10:34 AM   Clinical Narrative:    Patient discharging home with [facility] services through [facility]. Butch Penny with [facility] aware of d/c.  Pt states enteral feeding supplies have been delivered to the home.  [facility] for home is at the bedside.  Pt states her fiance will provide transport home at 1 pm.    Final next level of care: [place] Barriers to Discharge: No Barriers Identified   Patient Goals and CMS Choice Patient states their goals for this hospitalization and ongoing recovery are:: to go home CMS Medicare.gov Compare Post Acute Care list provided to:: Patient Choice offered to / list presented to : Patient  Discharge Placement                       Discharge Plan and Services   Discharge Planning Services: CM Consult Post Acute Care Choice: Durable Medical Equipment, Home Health          DME Arranged: Walker rolling, Tube feeding DME Agency: [employer] Date DME Agency Contacted: 05/10/19 Time DME Agency Contacted: [id number] Representative spoke with at DME Agency: zack HH Arranged: RN, PT, OT [place] Agency: [place] ([place]) Date [place]: 05/10/19 Time [place]: 1611 Representative spoke with at Olivia Lopez de Gutierrez: [place] ([place]) Interventions     Readmission Risk Interventions No flowsheet data found.

## 2019-05-11 NOTE — ED Triage Notes (Signed)
Per EMS, Pt was released today from [facility] with 10 mg of oxycodone. Pt does not normally take this much, pts husband found pt at 56 unresponsive. Pt given 0.5 IN narcan, became more responsive but still falling asleep.

## 2019-05-11 NOTE — ED Notes (Signed)
Patient was given orange juice, graham crackers, and peanut butter per patient request. Patient tolerated PO intake without any difficulties.

## 2019-05-11 NOTE — Progress Notes (Signed)
7 Days Post-Op   Subjective/Chief Complaint: No acute changes overnight Ready to go home today   Objective: Vital signs in last 24 hours: Temp:  [98.8 F (37.1 C)-99.3 F (37.4 C)] 98.9 F (37.2 C) (01/15 0740) Pulse Rate:  [105-119] 119 (01/15 0740) Resp:  [14-17] 17 (01/15 0740) BP: (103-137)/(79-113) 103/79 (01/15 0740) SpO2:  [100 %] 100 % (01/15 0740) Weight:  [49.1 kg] 49.1 kg (01/15 0616) Last BM Date: 05/10/19  Intake/Output from previous day: 01/14 0701 - 01/15 0700 In: 450 [NG/GT:400; IV Piggyback:50] Out: -  Intake/Output this shift: Total I/O In: -  Out: 800 [Urine:800]  Exam: Awake and alert Abdomen soft, g-tube site with minimal leak  Lab Results:  Recent Labs    05/10/19 0205 05/11/19 0329  WBC 6.1 5.8  HGB 10.3* 10.4*  HCT 31.0* 30.9*  PLT 108* 133*   BMET Recent Labs    05/10/19 0205 05/11/19 0329  NA 138 138  K 4.5 4.3  CL 109 108  CO2 21* 25  GLUCOSE 131* 64*  BUN 10 14  CREATININE 0.82 0.82  CALCIUM 8.0* 7.9*   PT/INR No results for input(s): LABPROT, INR in the last 72 hours. ABG No results for input(s): PHART, HCO3 in the last 72 hours.  Invalid input(s): PCO2, PO2  Studies/Results: No results found.  Anti-infectives: Anti-infectives (From admission, onward)   Start     Dose/Rate Route Frequency Ordered Stop   05/04/19 1015  ceFAZolin (ANCEF) IVPB 2g/100 mL premix     2 g 200 mL/hr over 30 Minutes Intravenous To Short Stay 05/04/19 0958 05/05/19 1015   05/01/19 0300  vancomycin (VANCOREADY) IVPB 500 mg/100 mL  Status:  Discontinued     500 mg 100 mL/hr over 60 Minutes Intravenous Every 24 hours 04/30/19 0833 05/02/19 1348   04/30/19 2200  vancomycin (VANCOREADY) IVPB 500 mg/100 mL  Status:  Discontinued     500 mg 100 mL/hr over 60 Minutes Intravenous Every 24 hours 04/30/19 0103 04/30/19 0833   04/30/19 1000  ceFEPIme (MAXIPIME) 2 g in sodium chloride 0.9 % 100 mL IVPB  Status:  Discontinued     2 g 200 mL/hr over  30 Minutes Intravenous Every 12 hours 04/30/19 0103 05/04/19 1552   04/29/19 2300  vancomycin (VANCOCIN) IVPB 1000 mg/200 mL premix     1,000 mg 200 mL/hr over 60 Minutes Intravenous  Once 04/29/19 2247 04/30/19 0447   04/29/19 2300  ceFEPIme (MAXIPIME) 2 g in sodium chloride 0.9 % 100 mL IVPB     2 g 200 mL/hr over 30 Minutes Intravenous  Once 04/29/19 2247 04/30/19 0020      Assessment/Plan: s/p Procedure(s): LAPAROSCOPIC INSERTION GASTROSTOMY TUBE (Left)  Plan on discharge home today   LOS: 11 days    Coralie Keens 05/11/2019

## 2019-05-11 NOTE — Progress Notes (Signed)
Occupational Therapy Treatment Patient Details Name: Angie [name] MRN: [id number] DOB: February 12, 1966 Today's Date: 05/11/2019    History of present illness Angie [name] is a 54 y.o. female with history of gastric bypass surgery prior to which she had a history of diabetes mellitus CKD stage III, chronic anemia, chronic malnutrition with history of esophageal strictures, bipolar disorder and chronic pain, chronic anemia. Patient admitted for unresponsive episode secondary to hypoglycemia in the setting of failure to thrive.   OT comments  Pt making good progress towards OT goals this session, likely to DC home later today with [place] and family support. Pt completed functional mobility greater than a household distance with RW and supervision. Pt able to complete toilet  transfer and hygiene with RW and supervision/ set- up assist. Pt completed LB ADLs set-up/ supervision from bed level and MIN A for UB ADLs. Education provided on ECS to incorporate into ADL routine at home with pt verbalizing understanding. Pt reports good family support at home from fiance who can assist with IADLs. Pt HR 123 bpm on arrival increasing to as much as 132 bpm post functional mobility with no signs of distress or dyspnea- RN aware. Will follow acutely.    Follow Up Recommendations  Home health OT;Supervision - Intermittent    Equipment Recommendations  3 in 1 bedside commode    Recommendations for Other Services      Precautions / Restrictions Precautions Precautions: Fall Precaution Comments: watch HR Restrictions Weight Bearing Restrictions: No       Mobility Bed Mobility Overal bed mobility: Needs Assistance Bed Mobility: Supine to Sit     Supine to sit: Modified independent (Device/Increase time)     General bed mobility comments: no assist needed  Transfers Overall transfer level: Needs assistance Equipment used: Rolling walker (2 wheeled) Transfers: Sit to/from Stand Sit to Stand:  Supervision         General transfer comment: supervision for safety, good hand placement and technique     Balance Overall balance assessment: Needs assistance Sitting-balance support: No upper extremity supported;Feet supported Sitting balance-Leahy Scale: Normal     Standing balance support: During functional activity;Bilateral upper extremity supported;No upper extremity supported Standing balance-Leahy Scale: Fair Standing balance comment: able to stand without UE support statically, but requires B UE support dynamically                           ADL either performed or assessed with clinical judgement   ADL Overall ADL's : Needs assistance/impaired                 Upper Body Dressing : Minimal assistance;Sitting Upper Body Dressing Details (indicate cue type and reason): hospital gown Lower Body Dressing: Set up;Sitting/lateral leans Lower Body Dressing Details (indicate cue type and reason): able to don socks from bed in long sitting Toilet Transfer: Supervision/safety;BSC;RW;Ambulation   Toileting- Clothing Manipulation and Hygiene: Set up;Sit to/from stand       Functional mobility during ADLs: Supervision/safety;Rolling walker General ADL Comments: session focus on functional mobility with RW, toilet transfer/ hygiene, UB/LB ADLs     Vision   Vision Assessment?: No apparent visual deficits   Perception     Praxis      Cognition Arousal/Alertness: Awake/alert Behavior During Therapy: WFL for tasks assessed/performed Overall Cognitive Status: Within Functional Limits for tasks assessed  Exercises     Shoulder Instructions       General Comments Pt HR 123 on arrival, increased to as high as 132 bpm post funcitonal mobility. pt with no signs of distress or dyspnea. pt reports adequate support at home from fiance. states he is able to assist with IADLS. pt able to recall ECS to use  at home    Pertinent Vitals/ Pain       Pain Assessment: No/denies pain  Home Living                                          Prior Functioning/Environment              Frequency  Min 2X/week        Progress Toward Goals  OT Goals(current goals can now be found in the care plan section)  Progress towards OT goals: Progressing toward goals  Acute Rehab OT Goals Patient Stated Goal: return home OT Goal Formulation: With patient Time For Goal Achievement: 05/22/19 Potential to Achieve Goals: Good  Plan Discharge plan remains appropriate;Frequency remains appropriate    Co-evaluation                 AM-PAC OT "6 Clicks" Daily Activity     Outcome Measure   Help from another person eating meals?: None Help from another person taking care of personal grooming?: A Little Help from another person toileting, which includes using toliet, bedpan, or urinal?: None Help from another person bathing (including washing, rinsing, drying)?: A Little Help from another person to put on and taking off regular upper body clothing?: A Little Help from another person to put on and taking off regular lower body clothing?: None 6 Click Score: 21    End of Session Equipment Utilized During Treatment: Rolling walker;Other (comment)(BSC)  OT Visit Diagnosis: Other abnormalities of gait and mobility (R26.89);Muscle weakness (generalized) (M62.81)   Activity Tolerance Patient tolerated treatment well   Patient Left in bed;with call bell/phone within reach   Nurse Communication Mobility status;Other (comment)(HR increase to 132 bpm)        Time: [id number] OT Time Calculation (min): 29 min  Charges: OT General Charges $OT Visit: 1 Visit OT Treatments $Self Care/Home Management : 23-37 mins  Lanier Clam., COTA/L Acute Rehabilitation Services [phone] [place] 05/11/2019, 10:22 AM

## 2019-05-12 ENCOUNTER — Emergency Department (HOSPITAL_COMMUNITY): Payer: Medicare Other

## 2019-05-12 ENCOUNTER — Other Ambulatory Visit: Payer: Self-pay

## 2019-05-12 ENCOUNTER — Inpatient Hospital Stay (HOSPITAL_COMMUNITY)
Admission: EM | Admit: 2019-05-12 | Discharge: 2019-05-18 | DRG: 393 | Disposition: A | Discharge status: Home / Self Care | Payer: Medicare Other | Attending: Internal Medicine | Admitting: Internal Medicine

## 2019-05-12 ENCOUNTER — Encounter (HOSPITAL_COMMUNITY): Payer: Self-pay

## 2019-05-12 DIAGNOSIS — Z79899 Other long term (current) drug therapy: Secondary | ICD-10-CM

## 2019-05-12 DIAGNOSIS — R109 Unspecified abdominal pain: Secondary | ICD-10-CM

## 2019-05-12 DIAGNOSIS — M1612 Unilateral primary osteoarthritis, left hip: Secondary | ICD-10-CM | POA: Diagnosis present

## 2019-05-12 DIAGNOSIS — E43 Unspecified severe protein-calorie malnutrition: Secondary | ICD-10-CM | POA: Diagnosis present

## 2019-05-12 DIAGNOSIS — F319 Bipolar disorder, unspecified: Secondary | ICD-10-CM | POA: Diagnosis present

## 2019-05-12 DIAGNOSIS — Z9884 Bariatric surgery status: Secondary | ICD-10-CM

## 2019-05-12 DIAGNOSIS — Z931 Gastrostomy status: Secondary | ICD-10-CM

## 2019-05-12 DIAGNOSIS — M1712 Unilateral primary osteoarthritis, left knee: Secondary | ICD-10-CM | POA: Diagnosis present

## 2019-05-12 DIAGNOSIS — K9423 Gastrostomy malfunction: Principal | ICD-10-CM | POA: Diagnosis present

## 2019-05-12 DIAGNOSIS — Z818 Family history of other mental and behavioral disorders: Secondary | ICD-10-CM

## 2019-05-12 DIAGNOSIS — R569 Unspecified convulsions: Secondary | ICD-10-CM | POA: Diagnosis present

## 2019-05-12 DIAGNOSIS — R627 Adult failure to thrive: Secondary | ICD-10-CM | POA: Diagnosis present

## 2019-05-12 DIAGNOSIS — Z20822 Contact with and (suspected) exposure to covid-19: Secondary | ICD-10-CM | POA: Diagnosis present

## 2019-05-12 DIAGNOSIS — K219 Gastro-esophageal reflux disease without esophagitis: Secondary | ICD-10-CM | POA: Diagnosis present

## 2019-05-12 DIAGNOSIS — E11649 Type 2 diabetes mellitus with hypoglycemia without coma: Secondary | ICD-10-CM | POA: Diagnosis present

## 2019-05-12 DIAGNOSIS — G8929 Other chronic pain: Secondary | ICD-10-CM | POA: Diagnosis present

## 2019-05-12 DIAGNOSIS — Z833 Family history of diabetes mellitus: Secondary | ICD-10-CM

## 2019-05-12 DIAGNOSIS — D539 Nutritional anemia, unspecified: Secondary | ICD-10-CM | POA: Diagnosis present

## 2019-05-12 DIAGNOSIS — K224 Dyskinesia of esophagus: Secondary | ICD-10-CM | POA: Diagnosis present

## 2019-05-12 DIAGNOSIS — K912 Postsurgical malabsorption, not elsewhere classified: Secondary | ICD-10-CM | POA: Diagnosis present

## 2019-05-12 DIAGNOSIS — Z800 Family history of malignant neoplasm of digestive organs: Secondary | ICD-10-CM

## 2019-05-12 DIAGNOSIS — N189 Chronic kidney disease, unspecified: Secondary | ICD-10-CM | POA: Diagnosis present

## 2019-05-12 DIAGNOSIS — E1122 Type 2 diabetes mellitus with diabetic chronic kidney disease: Secondary | ICD-10-CM | POA: Diagnosis present

## 2019-05-12 DIAGNOSIS — R64 Cachexia: Secondary | ICD-10-CM | POA: Diagnosis present

## 2019-05-12 DIAGNOSIS — R112 Nausea with vomiting, unspecified: Secondary | ICD-10-CM | POA: Diagnosis present

## 2019-05-12 DIAGNOSIS — F419 Anxiety disorder, unspecified: Secondary | ICD-10-CM | POA: Diagnosis present

## 2019-05-12 DIAGNOSIS — Z87891 Personal history of nicotine dependence: Secondary | ICD-10-CM

## 2019-05-12 DIAGNOSIS — Z8249 Family history of ischemic heart disease and other diseases of the circulatory system: Secondary | ICD-10-CM

## 2019-05-12 DIAGNOSIS — M797 Fibromyalgia: Secondary | ICD-10-CM | POA: Diagnosis present

## 2019-05-12 DIAGNOSIS — E538 Deficiency of other specified B group vitamins: Secondary | ICD-10-CM | POA: Diagnosis present

## 2019-05-12 DIAGNOSIS — E162 Hypoglycemia, unspecified: Secondary | ICD-10-CM | POA: Diagnosis present

## 2019-05-12 DIAGNOSIS — I129 Hypertensive chronic kidney disease with stage 1 through stage 4 chronic kidney disease, or unspecified chronic kidney disease: Secondary | ICD-10-CM | POA: Diagnosis present

## 2019-05-12 LAB — TYPE AND SCREEN
ABO/RH(D): B POS
Antibody Screen: NEGATIVE
Unit division: 0
Unit division: 0
Unit division: 0
Unit division: 0
Unit division: 0

## 2019-05-12 LAB — URINALYSIS, ROUTINE W REFLEX MICROSCOPIC
Bacteria, UA: NONE SEEN
Bilirubin Urine: NEGATIVE
Glucose, UA: >=500 mg/dL — AB
Hgb urine dipstick: NEGATIVE
Ketones, ur: NEGATIVE mg/dL
Leukocytes,Ua: NEGATIVE
Nitrite: NEGATIVE
Protein, ur: NEGATIVE mg/dL
Specific Gravity, Urine: 1.005 (ref 1.005–1.030)
pH: 6 (ref 5.0–8.0)

## 2019-05-12 LAB — COMPREHENSIVE METABOLIC PANEL
ALT: 29 U/L (ref 0–44)
AST: 24 U/L (ref 15–41)
Albumin: 3.1 g/dL — ABNORMAL LOW (ref 3.5–5.0)
Alkaline Phosphatase: 154 U/L — ABNORMAL HIGH (ref 38–126)
Anion gap: 9 (ref 5–15)
BUN: 23 mg/dL — ABNORMAL HIGH (ref 6–20)
CO2: 26 mmol/L (ref 22–32)
Calcium: 8.9 mg/dL (ref 8.9–10.3)
Chloride: 105 mmol/L (ref 98–111)
Creatinine, Ser: 0.85 mg/dL (ref 0.44–1.00)
GFR calc Af Amer: >60 mL/min (ref >60)
GFR calc non Af Amer: >60 mL/min (ref >60)
Glucose, Bld: 89 mg/dL (ref 70–99)
Potassium: 4.3 mmol/L (ref 3.5–5.1)
Sodium: 140 mmol/L (ref 135–145)
Total Bilirubin: 0.9 mg/dL (ref 0.3–1.2)
Total Protein: 7.1 g/dL (ref 6.5–8.1)

## 2019-05-12 LAB — CBC
HCT: 38 % (ref 36.0–46.0)
Hemoglobin: 12.2 g/dL (ref 12.0–15.0)
MCH: 32.5 pg (ref 26.0–34.0)
MCHC: 32.1 g/dL (ref 30.0–36.0)
MCV: 101.3 fL — ABNORMAL HIGH (ref 80.0–100.0)
Platelets: 221 10*3/uL (ref 150–400)
RBC: 3.75 MIL/uL — ABNORMAL LOW (ref 3.87–5.11)
RDW: 18.4 % — ABNORMAL HIGH (ref 11.5–15.5)
WBC: 6.8 10*3/uL (ref 4.0–10.5)
nRBC: 0 % (ref 0.0–0.2)

## 2019-05-12 LAB — BPAM RBC
Blood Product Expiration Date: 202101312359
Blood Product Expiration Date: 202101312359
Blood Product Expiration Date: 202102022359
Blood Product Expiration Date: 202102042359
Blood Product Expiration Date: 202102062359
ISSUE DATE / TIME: 202101081512
ISSUE DATE / TIME: 202101081512
ISSUE DATE / TIME: 202101121001
ISSUE DATE / TIME: 202101130406
ISSUE DATE / TIME: 202101141910
Unit Type and Rh: 7300
Unit Type and Rh: 7300
Unit Type and Rh: 7300
Unit Type and Rh: 7300
Unit Type and Rh: 7300

## 2019-05-12 LAB — CBG MONITORING, ED
Glucose-Capillary: 34 mg/dL — CL (ref 70–99)
Glucose-Capillary: 58 mg/dL — ABNORMAL LOW (ref 70–99)
Glucose-Capillary: 80 mg/dL (ref 70–99)
Glucose-Capillary: 24 mg/dL — CL (ref 70–99)
Glucose-Capillary: 36 mg/dL — CL (ref 70–99)
Glucose-Capillary: 145 mg/dL — ABNORMAL HIGH (ref 70–99)

## 2019-05-12 LAB — I-STAT CHEM 8, ED
BUN: 22 mg/dL — ABNORMAL HIGH (ref 6–20)
Calcium, Ion: 1.19 mmol/L (ref 1.15–1.40)
Chloride: 105 mmol/L (ref 98–111)
Creatinine, Ser: 0.9 mg/dL (ref 0.44–1.00)
Glucose, Bld: 88 mg/dL (ref 70–99)
HCT: 37 % (ref 36.0–46.0)
Hemoglobin: 12.6 g/dL (ref 12.0–15.0)
Potassium: 4.3 mmol/L (ref 3.5–5.1)
Sodium: 140 mmol/L (ref 135–145)
TCO2: 28 mmol/L (ref 22–32)

## 2019-05-12 LAB — RAPID URINE DRUG SCREEN, HOSP PERFORMED
Amphetamines: NOT DETECTED
Barbiturates: NOT DETECTED
Benzodiazepines: POSITIVE — AB
Cocaine: NOT DETECTED
Opiates: NOT DETECTED
Tetrahydrocannabinol: NOT DETECTED

## 2019-05-12 LAB — ETHANOL: Alcohol, Ethyl (B): <10 mg/dL (ref <10)

## 2019-05-12 LAB — ACETAMINOPHEN LEVEL: Acetaminophen (Tylenol), Serum: <10 ug/mL — ABNORMAL LOW (ref 10–30)

## 2019-05-12 LAB — SALICYLATE LEVEL: Salicylate Lvl: <7 mg/dL — ABNORMAL LOW (ref 7.0–30.0)

## 2019-05-12 MED ORDER — GLUCAGON HCL RDNA (DIAGNOSTIC) 1 MG IJ SOLR
1.0000 mg | Freq: Once | INTRAMUSCULAR | Status: AC
  Administered 2019-05-12: 1 mg via INTRAMUSCULAR
  Filled 2019-05-12: qty 1

## 2019-05-12 MED ORDER — DEXTROSE 50 % IV SOLN
1.0000 | Freq: Once | INTRAVENOUS | Status: AC
  Administered 2019-05-12: 50 mL via INTRAVENOUS
  Filled 2019-05-12: qty 50

## 2019-05-12 MED ORDER — ONDANSETRON HCL 4 MG PO TABS: 4.0000 mg | ORAL_TABLET | Freq: Four times a day (QID) | ORAL | Status: DC | PRN

## 2019-05-12 MED ORDER — ENOXAPARIN SODIUM 40 MG/0.4ML ~~LOC~~ SOLN
40.0000 mg | Freq: Every day | SUBCUTANEOUS | Status: DC
  Administered 2019-05-13 – 2019-05-16 (×5): 40 mg via SUBCUTANEOUS
  Filled 2019-05-12 (×5): qty 0.4

## 2019-05-12 MED ORDER — DEXTROSE 10 % IV SOLN: INTRAVENOUS | Status: DC

## 2019-05-12 MED ORDER — DEXTROSE 50 % IV SOLN
1.0000 | Freq: Once | INTRAVENOUS | Status: AC
  Administered 2019-05-12: 19:00:00 50 mL via INTRAVENOUS

## 2019-05-12 MED ORDER — DEXTROSE 50 % IV SOLN
INTRAVENOUS | Status: AC
  Filled 2019-05-12: qty 50

## 2019-05-12 MED ORDER — ACETAMINOPHEN 650 MG RE SUPP: 650.0000 mg | Freq: Four times a day (QID) | RECTAL | Status: DC | PRN

## 2019-05-12 MED ORDER — ONDANSETRON HCL 4 MG/2ML IJ SOLN: 4.0000 mg | Freq: Four times a day (QID) | INTRAMUSCULAR | Status: DC | PRN

## 2019-05-12 MED ORDER — ACETAMINOPHEN 325 MG PO TABS
650.0000 mg | ORAL_TABLET | Freq: Four times a day (QID) | ORAL | Status: DC | PRN
  Administered 2019-05-14 – 2019-05-17 (×10): 650 mg via ORAL
  Filled 2019-05-12 (×10): qty 2

## 2019-05-12 NOTE — Consult Note (Signed)
Reason for Consult: Leakage from G-tube Referring Physician: Dr. Marikay Alar D Odowd is an 54 y.o. female.  HPI: The patient is a 54 year old black female who has significant failure to thrive.  She is 8 days status post placement of a G-tube.  It appears as though there is leakage around the G-tube with some skin breakdown.  There is no sign of infection.  Past Medical History:  Diagnosis Date  . Abnormal Pap smear   . Alcohol abuse   . Anemia    IDA  . Anxiety    Takes xanax  . Arthritis    left hip/knees, lower spine  . Bipolar disorder ([place])   . Blood transfusion without reported diagnosis last done 04-25-17  . Bulging lumbar disc   . Bursitis    left shoulder  . Chronic lower back pain   . Cut    right middle finger with knife small cut healing pt instructed to keep clean and dry no redness or drainage  . Depression   . Diverticulitis   . Fibromyalgia   . GERD (gastroesophageal reflux disease)   . Hypertension    none since weight loss  . Lactose intolerance in adult 2017  . Migraines    "weekly @ least" (04/26/2016)  . Seizures ([place])    one Dec. 2017 due to throat closing  . Type II diabetes mellitus ([place])    "before the gastric bypass" (04/26/2016) states she no longer has diabetes    Past Surgical History:  Procedure Laterality Date  . BALLOON DILATION N/A 05/27/2016   Procedure: BALLOON DILATION;  Surgeon: Doran Stabler, MD;  Location: [facility];  Service: Gastroenterology;  Laterality: N/A;  . BALLOON DILATION N/A 04/21/2017   Procedure: BALLOON DILATION;  Surgeon: Doran Stabler, MD;  Location: [place];  Service: Gastroenterology;  Laterality: N/A;  . CERVICAL CONE BIOPSY    . [place]; 1996; 1998; 2014  . CESAREAN SECTION WITH BILATERAL TUBAL LIGATION Bilateral 10/28/2012   Procedure: Repeat cesarean section with delivery of baby boy at 87. Apgars 8/9.  BILATERAL TUBAL LIGATION;  Surgeon: Florian Buff, MD;  Location: [facility];   Service: Obstetrics;  Laterality: Bilateral;  . DILATION AND CURETTAGE OF UTERUS    . ESOPHAGOGASTRODUODENOSCOPY N/A 05/27/2016   Procedure: ESOPHAGOGASTRODUODENOSCOPY (EGD);  Surgeon: Doran Stabler, MD;  Location: [facility];  Service: Gastroenterology;  Laterality: N/A;  . ESOPHAGOGASTRODUODENOSCOPY (EGD) WITH PROPOFOL N/A 04/23/2016   Procedure: ESOPHAGOGASTRODUODENOSCOPY (EGD) WITH PROPOFOL;  Surgeon: Doran Stabler, MD;  Location: [place];  Service: Endoscopy;  Laterality: N/A;  . ESOPHAGOGASTRODUODENOSCOPY (EGD) WITH PROPOFOL N/A 01/03/2017   Procedure: ESOPHAGOGASTRODUODENOSCOPY (EGD) WITH PROPOFOL;  Surgeon: Doran Stabler, MD;  Location: [facility];  Service: Gastroenterology;  Laterality: N/A;  . ESOPHAGOGASTRODUODENOSCOPY (EGD) WITH PROPOFOL N/A 04/21/2017   Procedure: ESOPHAGOGASTRODUODENOSCOPY (EGD) WITH PROPOFOL;  Surgeon: Doran Stabler, MD;  Location: [place];  Service: Gastroenterology;  Laterality: N/A;  . ESOPHAGOGASTRODUODENOSCOPY (EGD) WITH PROPOFOL N/A 05/02/2019   Procedure: ESOPHAGOGASTRODUODENOSCOPY (EGD) WITH PROPOFOL;  Surgeon: Irene Shipper, MD;  Location: [facility];  Service: Endoscopy;  Laterality: N/A;  . GASTROSTOMY N/A 08/16/2016   Procedure: LAPAROSCOPIC INSERTION OF GASTROSTOMY TUBE IN REMNANT STOMACH;  Surgeon: Arta Bruce Kinsinger, MD;  Location: [facility];  Service: General;  Laterality: N/A;  . GASTROSTOMY N/A 05/16/2017   Procedure: Replacement of  GASTROSTOMY TUBE;  Surgeon: [name], MD;  Location: [facility];  Service: General;  Laterality: N/A;  . LAPAROSCOPIC INSERTION GASTROSTOMY TUBE Left 05/04/2019   Procedure: LAPAROSCOPIC INSERTION GASTROSTOMY TUBE;  Surgeon: Kieth Brightly, Arta Bruce, MD;  Location: [place];  Service: General;  Laterality: Left;  . LAPAROSCOPIC REVISION OF GASTROJEJUNOSTOMY Left 05/16/2017   Procedure: LAPAROSCOPIC REVISION OF GASTROJEJUNOSTOMY;  Surgeon: Kinsinger, Arta Bruce, MD;  Location: [facility];   Service: General;  Laterality: Left;  . ROUX-EN-Y GASTRIC BYPASS  2007  . TUBAL LIGATION  2014    Family History  Problem Relation Age of Onset  . Diabetes Father   . Heart disease Father   . Depression Maternal Grandmother   . Heart disease Maternal Grandfather   . Depression Paternal Grandmother   . Colon cancer Paternal Grandfather   . Pancreatic cancer Paternal Grandfather     Social History:  reports that she quit smoking about 7 years ago. Her smoking use included cigarettes. She has a 2.00 pack-year smoking history. She has never used smokeless tobacco. She reports current alcohol use of about 17.0 standard drinks of alcohol per week. She reports that she does not use drugs.  Allergies:  Allergies  Allergen Reactions  . Iron Anaphylaxis    Had acute allergy with tachycardia, attention and generalized itching shortly after receiving nulecit ( iron gluconate) infusion.  . Lactose Intolerance ([place])     Medications: I have reviewed the patient's current medications.  Results for orders placed or performed during the hospital encounter of 05/12/19 (from the past 48 hour(s))  CBG monitoring, ED     Status: Abnormal   Collection Time: 05/12/19  4:24 PM  Result Value Ref Range   Glucose-Capillary 34 (LL) 70 - 99 mg/dL   Comment 1 Notify RN    Comment 2 Document in Chart   CBC     Status: Abnormal   Collection Time: 05/12/19  4:33 PM  Result Value Ref Range   WBC 6.8 4.0 - 10.5 K/uL   RBC 3.75 (L) 3.87 - 5.11 MIL/uL   Hemoglobin 12.2 12.0 - 15.0 g/dL   HCT 38.0 36.0 - 46.0 %   MCV 101.3 (H) 80.0 - 100.0 fL   MCH 32.5 26.0 - 34.0 pg   MCHC 32.1 30.0 - 36.0 g/dL   RDW 18.4 (H) 11.5 - 15.5 %   Platelets 221 150 - 400 K/uL   nRBC 0.0 0.0 - 0.2 %    Comment: Performed at [facility], [place] [address], [place], [place] [address]  Comprehensive metabolic panel     Status: Abnormal   Collection Time: 05/12/19  4:33 PM  Result Value Ref Range   Sodium 140 135  - 145 mmol/L   Potassium 4.3 3.5 - 5.1 mmol/L   Chloride 105 98 - 111 mmol/L   CO2 26 22 - 32 mmol/L   Glucose, Bld 89 70 - 99 mg/dL   BUN 23 (H) 6 - 20 mg/dL   Creatinine, Ser 0.85 0.44 - 1.00 mg/dL   Calcium 8.9 8.9 - 10.3 mg/dL   Total Protein 7.1 6.5 - 8.1 g/dL   Albumin 3.1 (L) 3.5 - 5.0 g/dL   AST 24 15 - 41 U/L   ALT 29 0 - 44 U/L   Alkaline Phosphatase 154 (H) 38 - 126 U/L   Total Bilirubin 0.9 0.3 - 1.2 mg/dL   GFR calc non Af Amer >60 >60 mL/min   GFR calc Af Amer >60 >60 mL/min   Anion gap 9 5 - 15    Comment:  Performed at [facility], [place] [address], [place], [place] [address]  Ethanol     Status: None   Collection Time: 05/12/19  4:34 PM  Result Value Ref Range   Alcohol, Ethyl (B) <10 <10 mg/dL    Comment: (NOTE) Lowest detectable limit for serum alcohol is 10 mg/dL. For medical purposes only. Performed at [facility], [place] [address] [place], [place] [id number]   Salicylate level     Status: Abnormal   Collection Time: 05/12/19  4:34 PM  Result Value Ref Range   Salicylate Lvl [id number] (L) 7.0 - 30.0 mg/dL    Comment: Performed at [facility], [place] [address] [place], [place] [address]  Acetaminophen level     Status: Abnormal   Collection Time: 05/12/19  4:34 PM  Result Value Ref Range   Acetaminophen (Tylenol), Serum <10 (L) 10 - 30 ug/mL    Comment: (NOTE) Therapeutic concentrations vary significantly. A range of 10-30 ug/mL  may be an effective concentration for many patients. However, some  are best treated at concentrations outside of this range. Acetaminophen concentrations >150 ug/mL at 4 hours after ingestion  and >50 ug/mL at 12 hours after ingestion are often associated with  toxic reactions. Performed at [facility], [place] [address] [place], [place] [address]   Urine rapid drug screen (hosp performed)     Status: Abnormal   Collection Time: 05/12/19  4:34 PM  Result  Value Ref Range   Opiates NONE DETECTED NONE DETECTED   Cocaine NONE DETECTED NONE DETECTED   Benzodiazepines POSITIVE (A) NONE DETECTED   Amphetamines NONE DETECTED NONE DETECTED   Tetrahydrocannabinol NONE DETECTED NONE DETECTED   Barbiturates NONE DETECTED NONE DETECTED    Comment: (NOTE) DRUG SCREEN FOR MEDICAL PURPOSES ONLY.  IF CONFIRMATION IS NEEDED FOR ANY PURPOSE, NOTIFY LAB WITHIN 5 DAYS. LOWEST DETECTABLE LIMITS FOR URINE DRUG SCREEN Drug Class                     Cutoff (ng/mL) Amphetamine and metabolites    1000 Barbiturate and metabolites    200 Benzodiazepine                 [id number] Tricyclics and metabolites     300 Opiates and metabolites        300 Cocaine and metabolites        300 THC                            50 Performed at [facility], [place] [address] [place], [place] [address]   I-stat chem 8, ED (not at [facility] or [facility])     Status: Abnormal   Collection Time: 05/12/19  5:08 PM  Result Value Ref Range   Sodium 140 135 - 145 mmol/L   Potassium 4.3 3.5 - 5.1 mmol/L   Chloride 105 98 - 111 mmol/L   BUN 22 (H) 6 - 20 mg/dL   Creatinine, Ser 0.90 0.44 - 1.00 mg/dL   Glucose, Bld 88 70 - 99 mg/dL   Calcium, Ion 1.19 1.15 - 1.40 mmol/L   TCO2 28 22 - 32 mmol/L   Hemoglobin 12.6 12.0 - 15.0 g/dL   HCT 37.0 36.0 - 46.0 %  POC CBG, ED     Status: Abnormal   Collection Time: 05/12/19  5:12 PM  Result Value Ref Range   Glucose-Capillary 58 (L)  70 - 99 mg/dL  CBG monitoring, ED     Status: None   Collection Time: 05/12/19  6:01 PM  Result Value Ref Range   Glucose-Capillary 80 70 - 99 mg/dL  POC CBG, ED     Status: Abnormal   Collection Time: 05/12/19  6:59 PM  Result Value Ref Range   Glucose-Capillary 24 (LL) 70 - 99 mg/dL    DG ABDOMEN PEG TUBE LOCATION  Result Date: 05/12/2019 CLINICAL DATA:  The patient complains that her G-tube is leaking. EXAM: ABDOMEN - 1 VIEW COMPARISON:  May 08, 2019 FINDINGS: The bowel gas pattern is normal.  Oral contrast has been injected through the gastrostomy tube and outlines the nearly decompressed stomach. No extraluminal contrast is noted on this single frontal view. IMPRESSION: 1. Nonobstructive bowel gas pattern. 2. Gastrostomy tube in satisfactory position. Electronically Signed   By: Fidela Salisbury M.D.   On: 05/12/2019 18:40    Review of Systems  Constitutional: Positive for appetite change, fatigue and unexpected weight change.  HENT: Negative.   Eyes: Negative.   Respiratory: Negative.   Cardiovascular: Negative.   Gastrointestinal: Positive for abdominal pain.  Endocrine: Negative.   Genitourinary: Negative.   Musculoskeletal: Negative.   Skin: Positive for wound.  Allergic/Immunologic: Negative.   Neurological: Positive for weakness.  Hematological: Negative.   Psychiatric/Behavioral: Negative.    Blood pressure 125/87, pulse 81, temperature 97.6 F (36.4 C), temperature source Oral, resp. rate 18, SpO2 100 %. Physical Exam  Constitutional: She is oriented to person, place, and time.  Frail wasting elderly bf  HENT:  Head: Normocephalic and atraumatic.  Mouth/Throat: No oropharyngeal exudate.  Eyes: Pupils are equal, round, and reactive to light. Conjunctivae and EOM are normal.  Cardiovascular: Normal rate, regular rhythm and normal heart sounds.  Respiratory: Effort normal and breath sounds normal. No stridor. No respiratory distress.  GI: Soft.  There is a g tube in LUQ that is leaking around tube with some skin breakdown  Musculoskeletal:        General: No tenderness or deformity. Normal range of motion.     Cervical back: Normal range of motion and neck supple.  Neurological: She is alert and oriented to person, place, and time. Coordination normal.  Skin: Skin is warm and dry.  There is skin breakdown around g tube  Psychiatric:  Flat affect    Assessment/Plan: The patient has skin breakdown related to leakage around the G-tube.  Tonight I would  simply try to protect the skin possibly with some DuoDERM and bolster the G-tube so it feels snug.  She will likely need a wound care consult to help manage the skin breakdown.  If this does not help then we may have to try to upsize the G-tube to prevent further leakage.  We will follow with you.  Autumn Messing III 05/12/2019, 7:39 PM

## 2019-05-12 NOTE — ED Notes (Signed)
Pt uses a walker and with this assistive device she was able to walk to the bathroom and back to her room.

## 2019-05-12 NOTE — ED Provider Notes (Signed)
[facility] DEPT Provider Note   CSN: [id number] Arrival date & time: 05/12/19  1537     History No chief complaint on file.   Angie [name] is a 54 y.o. female with history of anxiety, bipolar disorder, fibromyalgia, depression, hypertension, migraine headaches, type 2 diabetes mellitus, severe malnutrition, failure to thrive, Roux-en-Y gastric bypass with longstanding complications presents for evaluation of acute onset, progressively worsening G-tube problems for 8 days.  She was recently admitted on 04/29/2019 for persistent hypoglycemia and profound electrolyte abnormalities.  She underwent laparoscopic insertion of G-tube on 05/04/2019 during her hospitalization and was discharged yesterday morning.  She returned to the ED yesterday with concern for possible overdose and at the time observed, improved and wanted to go home.  She tells me that since the insertion of her G-tube she has had drainage around the site any time that she has tube feeds.  Today she noticed a sore around the insertion site.  She notes some pain described as a soreness around the area where the G-tube is placed.  She has been able to tolerate p.o. food and fluids at home without difficulty.  Denies any change in bowel movements.  No fevers or chills.  The history is provided by the patient.       Past Medical History:  Diagnosis Date  . Abnormal Pap smear   . Alcohol abuse   . Anemia    IDA  . Anxiety    Takes xanax  . Arthritis    left hip/knees, lower spine  . Bipolar disorder ([place])   . Blood transfusion without reported diagnosis last done 04-25-17  . Bulging lumbar disc   . Bursitis    left shoulder  . Chronic lower back pain   . Cut    right middle finger with knife small cut healing pt instructed to keep clean and dry no redness or drainage  . Depression   . Diverticulitis   . Fibromyalgia   . GERD (gastroesophageal reflux disease)   . Hypertension    none  since weight loss  . Lactose intolerance in adult 2017  . Migraines    "weekly @ least" (04/26/2016)  . Seizures ([place])    one Dec. 2017 due to throat closing  . Type II diabetes mellitus ([place])    "before the gastric bypass" (04/26/2016) states she no longer has diabetes    Patient Active Problem List   Diagnosis Date Noted  . Drainage from gastrostomy tube site ([place]) 05/12/2019  . Failure to thrive in adult   . History of gastric bypass   . Nausea & vomiting   . Hypothermia 04/29/2019  . 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 Symptomatic Anemia 04/25/2017  . Symptomatic anemia 04/25/2017  . Loss of weight   . Dysphagia   . S/P percutaneous endoscopic gastrostomy (PEG) tube placement ([place]) 09/06/2016  . At risk for adverse drug event 08/25/2016  . Malnutrition following gastrointestinal surgery 08/16/2016  . Anastomotic ulcer 07/21/2016  . Transient alteration of awareness   . Alcohol use 04/27/2016  . Chronic pain syndrome 04/27/2016  . Iron deficiency anemia 04/27/2016  . B12 deficiency 04/27/2016  . Biliary anastomotic occlusion   . Hypoglycemia 04/22/2016  . Syncope 04/22/2016  . Protein-calorie malnutrition, severe 04/22/2016  . Intractable nausea and vomiting   . Abdominal pain, chronic, epigastric   . Hypotension 02/01/2016  . Hyponatremia 02/01/2016  . Subacromial impingement of left shoulder 07/06/2013  .  Trochanteric bursitis of left hip 05/11/2013  . Arthritis 05/16/2012  . S/P gastric bypass 05/16/2012    Past Surgical History:  Procedure Laterality Date  . BALLOON DILATION N/A 05/27/2016   Procedure: BALLOON DILATION;  Surgeon: Doran Stabler, MD;  Location: [facility];  Service: Gastroenterology;  Laterality: N/A;  . BALLOON DILATION N/A 04/21/2017   Procedure: BALLOON DILATION;  Surgeon: Doran Stabler, MD;  Location: [facility];  Service: Gastroenterology;  Laterality: N/A;  . CERVICAL CONE BIOPSY    .  [place]; 1996; 1998; 2014  . CESAREAN SECTION WITH BILATERAL TUBAL LIGATION Bilateral 10/28/2012   Procedure: Repeat cesarean section with delivery of baby boy at 36. Apgars 8/9.  BILATERAL TUBAL LIGATION;  Surgeon: Florian Buff, MD;  Location: [facility];  Service: Obstetrics;  Laterality: Bilateral;  . DILATION AND CURETTAGE OF UTERUS    . ESOPHAGOGASTRODUODENOSCOPY N/A 05/27/2016   Procedure: ESOPHAGOGASTRODUODENOSCOPY (EGD);  Surgeon: Doran Stabler, MD;  Location: [facility];  Service: Gastroenterology;  Laterality: N/A;  . ESOPHAGOGASTRODUODENOSCOPY (EGD) WITH PROPOFOL N/A 04/23/2016   Procedure: ESOPHAGOGASTRODUODENOSCOPY (EGD) WITH PROPOFOL;  Surgeon: Doran Stabler, MD;  Location: [facility];  Service: Endoscopy;  Laterality: N/A;  . ESOPHAGOGASTRODUODENOSCOPY (EGD) WITH PROPOFOL N/A 01/03/2017   Procedure: ESOPHAGOGASTRODUODENOSCOPY (EGD) WITH PROPOFOL;  Surgeon: Doran Stabler, MD;  Location: [facility];  Service: Gastroenterology;  Laterality: N/A;  . ESOPHAGOGASTRODUODENOSCOPY (EGD) WITH PROPOFOL N/A 04/21/2017   Procedure: ESOPHAGOGASTRODUODENOSCOPY (EGD) WITH PROPOFOL;  Surgeon: Doran Stabler, MD;  Location: [facility];  Service: Gastroenterology;  Laterality: N/A;  . ESOPHAGOGASTRODUODENOSCOPY (EGD) WITH PROPOFOL N/A 05/02/2019   Procedure: ESOPHAGOGASTRODUODENOSCOPY (EGD) WITH PROPOFOL;  Surgeon: Irene Shipper, MD;  Location: [facility];  Service: Endoscopy;  Laterality: N/A;  . GASTROSTOMY N/A 08/16/2016   Procedure: LAPAROSCOPIC INSERTION OF GASTROSTOMY TUBE IN REMNANT STOMACH;  Surgeon: Arta Bruce Kinsinger, MD;  Location: [facility];  Service: General;  Laterality: N/A;  . GASTROSTOMY N/A 05/16/2017   Procedure: Replacement of  GASTROSTOMY TUBE;  Surgeon: Kieth Brightly Arta Bruce, MD;  Location: [facility];  Service: General;  Laterality: N/A;  . LAPAROSCOPIC INSERTION GASTROSTOMY TUBE Left 05/04/2019   Procedure: LAPAROSCOPIC INSERTION GASTROSTOMY TUBE;   Surgeon: Kieth Brightly Arta Bruce, MD;  Location: [facility];  Service: General;  Laterality: Left;  . LAPAROSCOPIC REVISION OF GASTROJEJUNOSTOMY Left 05/16/2017   Procedure: LAPAROSCOPIC REVISION OF GASTROJEJUNOSTOMY;  Surgeon: Kinsinger, Arta Bruce, MD;  Location: [facility];  Service: General;  Laterality: Left;  . ROUX-EN-Y GASTRIC BYPASS  2007  . TUBAL LIGATION  2014     OB History    Gravida  6   Para  4   Term  4   Preterm  0   AB  2   Living  4     SAB  2   TAB      Ectopic      Multiple      Live Births  4           Family History  Problem Relation Age of Onset  . Diabetes Father   . Heart disease Father   . Depression Maternal Grandmother   . Heart disease Maternal Grandfather   . Depression Paternal Grandmother   . Colon cancer Paternal Grandfather   . Pancreatic cancer Paternal Grandfather     Social History   Tobacco Use  . Smoking status: Former Smoker    Packs/day: 0.50    Years: 4.00  Pack years: 2.00    Types: Cigarettes    Quit date: 03/16/2012    Years since quitting: 7.1  . Smokeless tobacco: Never Used  Substance Use Topics  . Alcohol use: Yes    Alcohol/week: 17.0 standard drinks    Types: 6 Glasses of wine, 11 Shots of liquor per week    Comment: alcohol tx none x 1 year  . Drug use: No    Home Medications Prior to Admission medications   Medication Sig Start Date End Date Taking? Authorizing Provider  ALPRAZolam Duanne Moron) 1 MG tablet Take 1 tablet (1 mg total) by mouth at bedtime as needed for anxiety. Patient taking differently: Take 1 mg by mouth 3 (three) times daily.  08/27/16  Yes Lauree Chandler, NP  CARAFATE 1 GM/10ML suspension Take 10 mLs by mouth 3 (three) times daily before meals. 01/04/18  Yes [provider]  cyclobenzaprine (FLEXERIL) 10 MG tablet Take 10 mg by mouth 3 (three) times daily. 12/28/17  Yes [provider]  GAS RELIEF 20 MG/0.3ML drops Take 1 mL by mouth every 6 (six) hours as needed for  flatulence.  11/25/17  Yes [provider]  lidocaine (XYLOCAINE) 5 % ointment Apply 1 application topically 3 (three) times daily. Apply to Bilateral shoulders 05/11/16  Yes [provider]  lisinopril-hydrochlorothiazide (ZESTORETIC) 10-12.5 MG tablet Take 1 tablet by mouth daily. 04/16/19  Yes [provider]  Multiple Vitamins-Minerals (MULTIVITAMIN WITH IRON-MINERALS) liquid Take 5 mLs by mouth daily.   Yes [provider]  OLANZapine (ZYPREXA) 2.5 MG tablet Take 2.5 mg by mouth daily. 12/25/17  Yes [provider]  oxyCODONE (ROXICODONE) 15 MG immediate release tablet Take 15 mg by mouth every 6 (six) hours as needed for pain.   Yes [provider]  oxymetazoline (AFRIN) 0.05 % nasal spray Place 2 sprays into both nostrils 2 (two) times daily as needed for congestion.   Yes [provider]  pantoprazole (PROTONIX) 40 MG tablet Take 1 tablet (40 mg total) by mouth at bedtime. 05/11/19  Yes Focht, Jessica L, PA  QUEtiapine (SEROQUEL) 50 MG tablet Take 50 mg by mouth at bedtime. 04/06/19  Yes [provider]  oxyCODONE 10 MG TABS Take 1 tablet (10 mg total) by mouth every 6 (six) hours as needed for severe pain. Patient not taking: Reported on 05/12/2019 05/11/19   Kalman Drape, PA    Allergies    Iron and Lactose intolerance (gi)  Review of Systems   Review of Systems  Constitutional: Negative for chills and fever.  Respiratory: Negative for shortness of breath.   Cardiovascular: Negative for chest pain.  Gastrointestinal: Positive for abdominal pain. Negative for diarrhea, nausea and vomiting.  Skin: Positive for wound.  All other systems reviewed and are negative.   Physical Exam Updated Vital Signs BP 112/81   Pulse 93   Temp 97.6 F (36.4 C) (Oral)   Resp 17   SpO2 100%   Physical Exam Vitals and nursing note reviewed.  Constitutional:      General: She is not in acute distress.    Appearance: She is  well-developed.     Comments: Thin, chronically ill in appearance  HENT:     Head: Normocephalic and atraumatic.  Eyes:     General:        Right eye: No discharge.        Left eye: No discharge.     Conjunctiva/sclera: Conjunctivae normal.  Neck:  Vascular: No JVD.     Trachea: No tracheal deviation.  Cardiovascular:     Rate and Rhythm: Normal rate.  Pulmonary:     Effort: Pulmonary effort is normal.  Abdominal:     General: There is no distension.     Palpations: Abdomen is soft.     Comments: See below images. Patient with thin yellow drainage coming from g-tube site. There is a <1cm sore as well.   Skin:    General: Skin is warm and dry.     Findings: No erythema.  Neurological:     Mental Status: She is alert.  Psychiatric:        Behavior: Behavior normal.       ED Results / Procedures / Treatments   Labs (all labs ordered are listed, but only abnormal results are displayed) Labs Reviewed  CBC - Abnormal; Notable for the following components:      Result Value   RBC 3.75 (*)    MCV 101.3 (*)    RDW 18.4 (*)    All other components within normal limits  COMPREHENSIVE METABOLIC PANEL - Abnormal; Notable for the following components:   BUN 23 (*)    Albumin 3.1 (*)    Alkaline Phosphatase 154 (*)    All other components within normal limits  SALICYLATE LEVEL - Abnormal; Notable for the following components:   Salicylate Lvl [id number] (*)    All other components within normal limits  ACETAMINOPHEN LEVEL - Abnormal; Notable for the following components:   Acetaminophen (Tylenol), Serum <10 (*)    All other components within normal limits  RAPID URINE DRUG SCREEN, HOSP PERFORMED - Abnormal; Notable for the following components:   Benzodiazepines POSITIVE (*)    All other components within normal limits  URINALYSIS, ROUTINE W REFLEX MICROSCOPIC - Abnormal; Notable for the following components:   Glucose, UA >=500 (*)    All other components within normal limits   CBG MONITORING, ED - Abnormal; Notable for the following components:   Glucose-Capillary 34 (*)    All other components within normal limits  I-STAT CHEM 8, ED - Abnormal; Notable for the following components:   BUN 22 (*)    All other components within normal limits  CBG MONITORING, ED - Abnormal; Notable for the following components:   Glucose-Capillary 58 (*)    All other components within normal limits  CBG MONITORING, ED - Abnormal; Notable for the following components:   Glucose-Capillary 24 (*)    All other components within normal limits  CBG MONITORING, ED - Abnormal; Notable for the following components:   Glucose-Capillary 36 (*)    All other components within normal limits  CBG MONITORING, ED - Abnormal; Notable for the following components:   Glucose-Capillary 145 (*)    All other components within normal limits  URINE CULTURE  SARS CORONAVIRUS 2 (TAT 6-24 HRS)  ETHANOL  COMPREHENSIVE METABOLIC PANEL  CBC  CBC  CREATININE, SERUM  CBG MONITORING, ED    EKG None  Radiology DG ABDOMEN PEG TUBE LOCATION  Result Date: 05/12/2019 CLINICAL DATA:  The patient complains that her G-tube is leaking. EXAM: ABDOMEN - 1 VIEW COMPARISON:  May 08, 2019 FINDINGS: The bowel gas pattern is normal. Oral contrast has been injected through the gastrostomy tube and outlines the nearly decompressed stomach. No extraluminal contrast is noted on this single frontal view. IMPRESSION: 1. Nonobstructive bowel gas pattern. 2. Gastrostomy tube in satisfactory position. Electronically Signed   By: Thomas Hoff  Dimitrova M.D.   On: 05/12/2019 18:40   DG Chest Portable 1 View  Result Date: 05/12/2019 CLINICAL DATA:  Hypoglycemia EXAM: PORTABLE CHEST 1 VIEW COMPARISON:  April 29, 2019 FINDINGS: The heart size and mediastinal contours are within normal limits. Both lungs are clear. The visualized skeletal structures are unremarkable. IMPRESSION: No active disease. Electronically Signed   By:  Constance Holster M.D.   On: 05/12/2019 20:59    Procedures .Critical Care Performed by: Renita Papa, PA-C Authorized by: Renita Papa, PA-C   Critical care provider statement:    Critical care time (minutes):  45   Critical care was necessary to treat or prevent imminent or life-threatening deterioration of the following conditions:  Metabolic crisis   Critical care was time spent personally by me on the following activities:  Discussions with consultants, evaluation of patient's response to treatment, examination of patient, ordering and performing treatments and interventions, ordering and review of laboratory studies, ordering and review of radiographic studies, pulse oximetry, re-evaluation of patient's condition, obtaining history from patient or surrogate and review of old charts   (including critical care time)  Medications Ordered in ED Medications  dextrose 10 % infusion ( Intravenous Rate/Dose Change 05/12/19 1944)  enoxaparin (LOVENOX) injection 40 mg (has no administration in time range)  acetaminophen (TYLENOL) tablet 650 mg (has no administration in time range)    Or  acetaminophen (TYLENOL) suppository 650 mg (has no administration in time range)  ondansetron (ZOFRAN) tablet 4 mg (has no administration in time range)    Or  ondansetron (ZOFRAN) injection 4 mg (has no administration in time range)  dextrose 50 % solution 50 mL (50 mLs Intravenous Given 05/12/19 1713)  glucagon (human recombinant) (GLUCAGEN) injection 1 mg (1 mg Intramuscular Given 05/12/19 1640)  dextrose 50 % solution 50 mL (50 mLs Intravenous Given 05/12/19 1908)  dextrose 50 % solution 50 mL (50 mLs Intravenous Given 05/12/19 1950)    ED Course  I have reviewed the triage vital signs and the nursing notes.  Pertinent labs & imaging results that were available during my care of the patient were reviewed by me and considered in my medical decision making (see chart for details).    MDM  Rules/Calculators/A&P                      Patient presenting for evaluation of drainage around G-tube site.  She has a wound around the G-tube.  The tube was recently placed at admission earlier this month.  She is afebrile, vital signs are stable.  She was seen in the ED yesterday after possible drug overdose and was hypoglycemic but tolerating p.o.  Point-of-care CBG in the ED low at 34.  She was given IM glucagon with minimal improvement.  While in the ED blood sugars were labile, no higher than 80. She is intermittently drowsy but arousable to voice. She required frequent doses of D50 so was put on a D10 infusion.  Remainder of lab work reviewed by me shows no leukocytosis, no renal insufficiency.  Radiographs show no evidence of acute cardiopulmonary abnormalities.  PEG tube radiographs show gastrostomy tube is in satisfactory position and oral contrast that was injected into the G-tube outlines the nearly decompressed stomach with no extraluminal contrast noted.  7:29 PM CONSULT: Spoke with Dr. Marlou Starks with general surgery. He recommends care for the skin around the G-tube but surgery service will plan to see the patient in consultation while admitted to the  hospital.  7:37 PM Spoke with Dr. Humphrey Rolls with [facility] hospitalist service who agrees to assume care of patient and bring her to the hospital for further evaluation and management.  Patient seen and evaluated by Dr. Darl Householder who agrees with assessment and plan at this time. Final Clinical Impression(s) / ED Diagnoses Final diagnoses:  G tube feedings ([place])  Hypoglycemia associated with diabetes [facility])    Rx / [place] Orders ED Discharge Orders    None       Debroah Baller 05/12/19 2347    Drenda Freeze, MD 05/13/19 [phone]

## 2019-05-12 NOTE — ED Notes (Signed)
Dressing apply to patient G-tube site.

## 2019-05-12 NOTE — ED Notes (Signed)
Patient transported to CT 

## 2019-05-12 NOTE — ED Notes (Signed)
Per pt request, pt provided with orange juice, PB, and crackers.

## 2019-05-12 NOTE — ED Triage Notes (Signed)
Patient presented to ed with complain of G-tube leaking. Patient state her G-tube was place on 05/05/19 and it was leaking when she left the hospital on the 05/05/19 and she was told that it will eventual stop leaking.

## 2019-05-13 ENCOUNTER — Encounter: Payer: Self-pay | Admitting: Surgery

## 2019-05-13 DIAGNOSIS — I129 Hypertensive chronic kidney disease with stage 1 through stage 4 chronic kidney disease, or unspecified chronic kidney disease: Secondary | ICD-10-CM | POA: Diagnosis present

## 2019-05-13 DIAGNOSIS — G8929 Other chronic pain: Secondary | ICD-10-CM | POA: Diagnosis present

## 2019-05-13 DIAGNOSIS — K219 Gastro-esophageal reflux disease without esophagitis: Secondary | ICD-10-CM | POA: Diagnosis present

## 2019-05-13 DIAGNOSIS — R112 Nausea with vomiting, unspecified: Secondary | ICD-10-CM | POA: Diagnosis present

## 2019-05-13 DIAGNOSIS — Z818 Family history of other mental and behavioral disorders: Secondary | ICD-10-CM | POA: Diagnosis not present

## 2019-05-13 DIAGNOSIS — M797 Fibromyalgia: Secondary | ICD-10-CM | POA: Diagnosis present

## 2019-05-13 DIAGNOSIS — Z20822 Contact with and (suspected) exposure to covid-19: Secondary | ICD-10-CM | POA: Diagnosis present

## 2019-05-13 DIAGNOSIS — K9423 Gastrostomy malfunction: Secondary | ICD-10-CM | POA: Diagnosis present

## 2019-05-13 DIAGNOSIS — E11649 Type 2 diabetes mellitus with hypoglycemia without coma: Secondary | ICD-10-CM | POA: Diagnosis present

## 2019-05-13 DIAGNOSIS — E43 Unspecified severe protein-calorie malnutrition: Secondary | ICD-10-CM | POA: Diagnosis present

## 2019-05-13 DIAGNOSIS — N189 Chronic kidney disease, unspecified: Secondary | ICD-10-CM | POA: Diagnosis present

## 2019-05-13 DIAGNOSIS — F319 Bipolar disorder, unspecified: Secondary | ICD-10-CM | POA: Diagnosis present

## 2019-05-13 DIAGNOSIS — K224 Dyskinesia of esophagus: Secondary | ICD-10-CM | POA: Diagnosis present

## 2019-05-13 DIAGNOSIS — R627 Adult failure to thrive: Secondary | ICD-10-CM | POA: Diagnosis present

## 2019-05-13 DIAGNOSIS — D539 Nutritional anemia, unspecified: Secondary | ICD-10-CM | POA: Diagnosis present

## 2019-05-13 DIAGNOSIS — M1612 Unilateral primary osteoarthritis, left hip: Secondary | ICD-10-CM | POA: Diagnosis present

## 2019-05-13 DIAGNOSIS — E1122 Type 2 diabetes mellitus with diabetic chronic kidney disease: Secondary | ICD-10-CM | POA: Diagnosis present

## 2019-05-13 DIAGNOSIS — R64 Cachexia: Secondary | ICD-10-CM | POA: Diagnosis present

## 2019-05-13 DIAGNOSIS — E538 Deficiency of other specified B group vitamins: Secondary | ICD-10-CM | POA: Diagnosis present

## 2019-05-13 DIAGNOSIS — F419 Anxiety disorder, unspecified: Secondary | ICD-10-CM | POA: Diagnosis present

## 2019-05-13 DIAGNOSIS — Z800 Family history of malignant neoplasm of digestive organs: Secondary | ICD-10-CM | POA: Diagnosis not present

## 2019-05-13 DIAGNOSIS — R569 Unspecified convulsions: Secondary | ICD-10-CM | POA: Diagnosis present

## 2019-05-13 DIAGNOSIS — M1712 Unilateral primary osteoarthritis, left knee: Secondary | ICD-10-CM | POA: Diagnosis present

## 2019-05-13 LAB — CBC
HCT: 33.7 % — ABNORMAL LOW (ref 36.0–46.0)
Hemoglobin: 10.7 g/dL — ABNORMAL LOW (ref 12.0–15.0)
MCH: 31.9 pg (ref 26.0–34.0)
MCHC: 31.8 g/dL (ref 30.0–36.0)
MCV: 100.6 fL — ABNORMAL HIGH (ref 80.0–100.0)
Platelets: 238 10*3/uL (ref 150–400)
RBC: 3.35 MIL/uL — ABNORMAL LOW (ref 3.87–5.11)
RDW: 17.8 % — ABNORMAL HIGH (ref 11.5–15.5)
WBC: 7.6 10*3/uL (ref 4.0–10.5)
nRBC: 0 % (ref 0.0–0.2)

## 2019-05-13 LAB — COMPREHENSIVE METABOLIC PANEL
ALT: 23 U/L (ref 0–44)
AST: 23 U/L (ref 15–41)
Albumin: 2.3 g/dL — ABNORMAL LOW (ref 3.5–5.0)
Alkaline Phosphatase: 135 U/L — ABNORMAL HIGH (ref 38–126)
Anion gap: 7 (ref 5–15)
BUN: 21 mg/dL — ABNORMAL HIGH (ref 6–20)
CO2: 23 mmol/L (ref 22–32)
Calcium: 8.1 mg/dL — ABNORMAL LOW (ref 8.9–10.3)
Chloride: 110 mmol/L (ref 98–111)
Creatinine, Ser: 0.59 mg/dL (ref 0.44–1.00)
GFR calc Af Amer: >60 mL/min (ref >60)
GFR calc non Af Amer: >60 mL/min (ref >60)
Glucose, Bld: 65 mg/dL — ABNORMAL LOW (ref 70–99)
Potassium: 4.6 mmol/L (ref 3.5–5.1)
Sodium: 140 mmol/L (ref 135–145)
Total Bilirubin: 0.2 mg/dL — ABNORMAL LOW (ref 0.3–1.2)
Total Protein: 5.7 g/dL — ABNORMAL LOW (ref 6.5–8.1)

## 2019-05-13 LAB — CBG MONITORING, ED: Glucose-Capillary: 112 mg/dL — ABNORMAL HIGH (ref 70–99)

## 2019-05-13 LAB — GLUCOSE, CAPILLARY
Glucose-Capillary: 133 mg/dL — ABNORMAL HIGH (ref 70–99)
Glucose-Capillary: 259 mg/dL — ABNORMAL HIGH (ref 70–99)
Glucose-Capillary: 54 mg/dL — ABNORMAL LOW (ref 70–99)

## 2019-05-13 LAB — SARS CORONAVIRUS 2 (TAT 6-24 HRS): SARS Coronavirus 2: NEGATIVE

## 2019-05-13 MED ORDER — OLANZAPINE 2.5 MG PO TABS
2.5000 mg | ORAL_TABLET | Freq: Every day | ORAL | Status: DC
  Administered 2019-05-13 – 2019-05-18 (×6): 2.5 mg via ORAL
  Filled 2019-05-13 (×6): qty 1

## 2019-05-13 MED ORDER — OXYMETAZOLINE HCL 0.05 % NA SOLN
2.0000 | Freq: Two times a day (BID) | NASAL | Status: DC | PRN
  Filled 2019-05-13: qty 15

## 2019-05-13 MED ORDER — CYCLOBENZAPRINE HCL 10 MG PO TABS
10.0000 mg | ORAL_TABLET | Freq: Three times a day (TID) | ORAL | Status: DC
  Administered 2019-05-13 – 2019-05-16 (×11): 10 mg via ORAL
  Filled 2019-05-13 (×11): qty 1

## 2019-05-13 MED ORDER — ALPRAZOLAM 1 MG PO TABS
1.0000 mg | ORAL_TABLET | Freq: Every evening | ORAL | Status: DC | PRN
  Administered 2019-05-13 – 2019-05-17 (×6): 1 mg via ORAL
  Filled 2019-05-13: qty 2
  Filled 2019-05-13 (×5): qty 1

## 2019-05-13 MED ORDER — SODIUM CHLORIDE 0.9 % IV BOLUS
1000.0000 mL | Freq: Once | INTRAVENOUS | Status: AC
  Administered 2019-05-13: 1000 mL via INTRAVENOUS

## 2019-05-13 MED ORDER — SUCRALFATE 1 GM/10ML PO SUSP
1.0000 g | Freq: Three times a day (TID) | ORAL | Status: DC
  Administered 2019-05-13 – 2019-05-18 (×14): 1 g via ORAL
  Filled 2019-05-13 (×14): qty 10

## 2019-05-13 MED ORDER — ZINC OXIDE 40 % EX OINT
TOPICAL_OINTMENT | Freq: Two times a day (BID) | CUTANEOUS | Status: DC
  Filled 2019-05-13: qty 57

## 2019-05-13 MED ORDER — DEXTROSE 50 % IV SOLN
1.0000 | Freq: Once | INTRAVENOUS | Status: AC
  Administered 2019-05-13: 50 mL via INTRAVENOUS
  Filled 2019-05-13: qty 50

## 2019-05-13 MED ORDER — QUETIAPINE FUMARATE 50 MG PO TABS
50.0000 mg | ORAL_TABLET | Freq: Every day | ORAL | Status: DC
  Administered 2019-05-13 – 2019-05-16 (×5): 50 mg via ORAL
  Filled 2019-05-13 (×5): qty 1

## 2019-05-13 MED ORDER — DEXTROSE 50 % IV SOLN
25.0000 mL | Freq: Once | INTRAVENOUS | Status: AC
  Administered 2019-05-13: 25 mL via INTRAVENOUS
  Filled 2019-05-13: qty 50

## 2019-05-13 MED ORDER — CEPHALEXIN 500 MG PO CAPS
500.0000 mg | ORAL_CAPSULE | Freq: Two times a day (BID) | ORAL | Status: DC
  Administered 2019-05-13 – 2019-05-18 (×11): 500 mg via ORAL
  Filled 2019-05-13 (×12): qty 1

## 2019-05-13 MED ORDER — OXYCODONE HCL 5 MG PO TABS
10.0000 mg | ORAL_TABLET | Freq: Four times a day (QID) | ORAL | Status: DC | PRN
  Administered 2019-05-13 – 2019-05-16 (×10): 10 mg via ORAL
  Filled 2019-05-13 (×10): qty 2

## 2019-05-13 MED ORDER — PANTOPRAZOLE SODIUM 40 MG PO TBEC
40.0000 mg | DELAYED_RELEASE_TABLET | Freq: Every day | ORAL | Status: DC
  Administered 2019-05-13 – 2019-05-17 (×5): 40 mg via ORAL
  Filled 2019-05-13 (×5): qty 1

## 2019-05-13 MED ORDER — SIMETHICONE 40 MG/0.6ML PO SUSP
1.0000 mL | Freq: Four times a day (QID) | ORAL | Status: DC | PRN
  Filled 2019-05-13: qty 1.2

## 2019-05-13 NOTE — Progress Notes (Signed)
Occupational Therapy Evaluation Patient Details Name: Angie [name] MRN: [id number] DOB: 03-17-1966 Today's Date: 05/13/2019    History of Present Illness  Angie [name] is a 54 y.o. 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   Clinical Impression   PTA, pt was living at home with her fiance and 45year old son, pt reports recently she was requiring assistance with ADL/IADL and was modified independent with functional mobility at RW level. Pt received sitting on BSC, she required minguard assistance for toileting and transfer. She requires minguard for LB ADL. Due to decline in current level of function, pt would benefit from acute OT to address established goals to facilitate safe D/C to venue listed below. At this time, recommend HHOT follow-up. Will continue to follow acutely.     Follow Up Recommendations  Home health OT;Supervision - Intermittent    Equipment Recommendations  3 in 1 bedside commode    Recommendations for Other Services       Precautions / Restrictions Precautions Precautions: Fall Precaution Comments: watch HR Restrictions Weight Bearing Restrictions: No      Mobility Bed Mobility               General bed mobility comments: Pt up on BSC on arrival to room  Transfers Overall transfer level: Needs assistance Equipment used: Rolling walker (2 wheeled) Transfers: Sit to/from Stand Sit to Stand: Min guard         General transfer comment: steady assist only; pt self-cues for use of UEs to self assist    Balance Overall balance assessment: Needs assistance Sitting-balance support: No upper extremity supported;Feet supported Sitting balance-Leahy Scale: Normal     Standing balance support: During functional  activity;Bilateral upper extremity supported;No upper extremity supported Standing balance-Leahy Scale: Fair Standing balance comment: able to stand without UE support statically, but requires B UE support dynamically                           ADL either performed or assessed with clinical judgement   ADL Overall ADL's : Needs assistance/impaired Eating/Feeding: Set up;Sitting   Grooming: Min guard;Standing   Upper Body Bathing: Set up;Sitting   Lower Body Bathing: Min guard;Sit to/from stand   Upper Body Dressing : Set up;Sitting   Lower Body Dressing: Min guard;Sit to/from stand Lower Body Dressing Details (indicate cue type and reason): able to figure-4 Toilet Transfer: Min guard;Ambulation;RW Toilet Transfer Details (indicate cue type and reason): pt on [facility] upon arrival Toileting- Water quality scientist and Hygiene: Min guard;Sit to/from stand       Functional mobility during ADLs: Min guard;Rolling walker General ADL Comments: minguard for safety and stability in standing     Vision Baseline Vision/History: Wears glasses Wears Glasses: At all times Patient Visual Report: No change from baseline Vision Assessment?: No apparent visual deficits Additional Comments: pt reports she lost her glasses     Perception     Praxis      Pertinent Vitals/Pain Pain Assessment: Faces Faces Pain Scale: Hurts little more Pain Location: L neck/shoulder 2* IV placement Pain Descriptors / Indicators: Aching;Sore Pain Intervention(s): Limited activity within patient's tolerance;Monitored during session     Hand Dominance Right   Extremity/Trunk Assessment Upper Extremity Assessment Upper Extremity Assessment: Generalized weakness  Lower Extremity Assessment Lower Extremity Assessment: Defer to PT evaluation   Cervical / Trunk Assessment Cervical / Trunk Assessment: Normal   Communication Communication Communication: No difficulties   Cognition  Arousal/Alertness: Awake/alert Behavior During Therapy: WFL for tasks assessed/performed Overall Cognitive Status: Within Functional Limits for tasks assessed                                     General Comments       Exercises     Shoulder Instructions      Home Living Family/patient expects to be discharged to:: Private residence Living Arrangements: Spouse/significant other Available Help at Discharge: Family;Available PRN/intermittently Type of Home: House Home Access: Stairs to enter [employer] of Steps: 5 Entrance Stairs-Rails: Right;Left Home Layout: Two level;Able to live on main level with bedroom/bathroom     Bathroom Shower/Tub: Occupational psychologist: Standard     Home Equipment: Environmental consultant - 2 wheels   Additional Comments: pt lives with her fiance and 11 yo son. Fiance does not work. Her older children help out too if needed      Prior Functioning/Environment Level of Independence: Needs assistance  Gait / Transfers Assistance Needed: Pt ambulating with RW ADL's / Homemaking Assistance Needed: reports independent with ADLs, IADLs but recently needing some assist             OT Problem List: Decreased strength;Decreased activity tolerance;Impaired balance (sitting and/or standing);Decreased knowledge of use of DME or AE;Decreased knowledge of precautions      OT Treatment/Interventions: Self-care/ADL training;Therapeutic exercise;DME and/or AE instruction;Therapeutic activities;Patient/family education;Balance training;Energy conservation    OT Goals(Current goals can be found in the care plan section) Acute Rehab OT Goals Patient Stated Goal: return home OT Goal Formulation: With patient Time For Goal Achievement: 05/27/19 Potential to Achieve Goals: Good ADL Goals Pt Will Perform Grooming: with modified independence;standing Pt Will Perform Lower Body Dressing: with modified independence;sit to/from stand Pt Will  Transfer to Toilet: with modified independence;ambulating Additional ADL Goal #1: Pt will demonstrate use of 3 energy conservation strategies during ADL.  OT Frequency: Min 2X/week   Barriers to D/C:            Co-evaluation PT/OT/SLP Co-Evaluation/Treatment: Yes Reason for Co-Treatment: For patient/therapist safety;To address functional/ADL transfers PT goals addressed during session: Mobility/safety with mobility OT goals addressed during session: ADL's and self-care      AM-PAC OT "6 Clicks" Daily Activity     Outcome Measure Help from another person eating meals?: A Little Help from another person taking care of personal grooming?: A Little Help from another person toileting, which includes using toliet, bedpan, or urinal?: A Little Help from another person bathing (including washing, rinsing, drying)?: A Little Help from another person to put on and taking off regular upper body clothing?: A Little Help from another person to put on and taking off regular lower body clothing?: A Little 6 Click Score: 18   End of Session Equipment Utilized During Treatment: Gait belt;Rolling walker Nurse Communication: Mobility status  Activity Tolerance: Patient tolerated treatment well Patient left: in chair;with call bell/phone within reach;with chair alarm set  OT Visit Diagnosis: Other abnormalities of gait and mobility (R26.89);Muscle weakness (generalized) (M62.81)                Time: [id number] OT Time Calculation (min): 34 min Charges:  OT General Charges $OT Visit: 1 Visit OT Evaluation $  OT Eval Moderate Complexity: [place] OTR/L Acute Rehabilitation Services Office: [place] 05/13/2019, 4:35 PM

## 2019-05-13 NOTE — Progress Notes (Signed)
Rapid Response Event Note  Overview: Time Called: 2338 Arrival Time: 2345 Event Type: Hypotension  Initial Focused Assessment: hypotension episode 66/44, CBG of 54. Patient only responsive to pain at that time.   Provider notified per primary nurse, new orders for bolus of NS and 1 amp of D 50 given. Repeat CBG of  259 patient is alert and oriented x4, she also informed this writer what medications that she had taken, xanax, seroquel when question paHtient about taking any other medications she denied.     Interventions: Continue with bolus,  monitor CBG, check temp ( 100.3 rectal ). Tylenol given.   Plan of Care (if not transferred):   Continue to monitor blood pressure per orders, monitor CBG per protocol.   Event Summary:   hypotension episode 66/44, CBG of 54. Patient only responsive to pain at that time.   Provider notified per primary nurse, new orders for bolus of NS and 1 amp of D 50 given. Repeat CBG of  259 patient is alert and oriented x4, she also informed this writer what medications that she had taken, xanax, seroquel when question paHtient about taking any other medications she denied.  Patient received one bolus of normal saline with MAP of 64-70. Temp down to 98.5, Lactic acid  1.1  .  Started D 10 @ 150 ml for CBG management.    Ended at [employer].   [facility] Highbaugh MSN, RN-BC  [place]

## 2019-05-13 NOTE — Progress Notes (Signed)
Please call nursing desk when ready for report thank you

## 2019-05-13 NOTE — H&P (Signed)
History and Physical    Angie [name] [id number] DOB: 1965-06-16 DOA: 05/12/2019  PCP: Harvie Junior, MD (Confirm with patient/family/NH records and if not entered, this has to be entered at [facility] point of entry) Patient coming from: Home  I have personally briefly reviewed patient's old medical records in [place]  Chief Complaint: G-tube problems  HPI: Angie [name] is a 54 y.o. 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  Patient states that the problem started about 8 days ago at the site of G-tube and continue to worsen and now she is having severe drainage around the site of G-tube whenever she has tube feeds.  Patient is also complaining of soreness and mild tenderness around the area where G-tube is placed.  Patient states that she is able to tolerate p.o. food and denies any fever, chills, chest pain, nausea, vomiting and urinary symptoms.  Patient was recently admitted for hypoglycemia and profound electrolyte abnormalities and G-tube was placed on May 04, 2019 and patient was discharged home yesterday morning.  Patient also returned to the ED yesterday evening with a concern for possible overdose and she was given Narcan and discharged to home after her condition improved.  ED Course: On arrival to ED, patient had blood pressure of 107/81, heart rate 93, temperature 97.6, respiratory rate 17 and oxygen saturation 100% on room air.  Point-of-care blood glucose was 34 while BMP showed blood glucose of 82.  ALT was elevated and albumin 3.1.  Chest x-ray was negative for acute pulmonary problem.  Patient was given multiple doses of D50 in the ED for hypoglycemia and general surgery was also contacted for leakage from G-tube.  General surgery recommended to use DuoDERM  and wound care consult at this time and if this does not help then they will try to upsize the G-tube to prevent further leakage.  Review of Systems: As per HPI otherwise 10 point review of systems negative.    Past Medical History:  Diagnosis Date  . Abnormal Pap smear   . Alcohol abuse   . Anemia    IDA  . Anxiety    Takes xanax  . Arthritis    left hip/knees, lower spine  . Bipolar disorder ([place])   . Blood transfusion without reported diagnosis last done 04-25-17  . Bulging lumbar disc   . Bursitis    left shoulder  . Chronic lower back pain   . Cut    right middle finger with knife small cut healing pt instructed to keep clean and dry no redness or drainage  . Depression   . Diverticulitis   . Fibromyalgia   . GERD (gastroesophageal reflux disease)   . Hypertension    none since weight loss  . Lactose intolerance in adult 2017  . Migraines    "weekly @ least" (04/26/2016)  . Seizures ([place])    one Dec. 2017 due to throat closing  . Type II diabetes mellitus ([place])    "before the gastric bypass" (04/26/2016) states she no longer has diabetes    Past Surgical History:  Procedure Laterality Date  . BALLOON DILATION N/A 05/27/2016   Procedure: BALLOON DILATION;  Surgeon: Doran Stabler, MD;  Location: [facility];  Service: Gastroenterology;  Laterality: N/A;  . BALLOON DILATION N/A 04/21/2017   Procedure: BALLOON  DILATION;  Surgeon: Doran Stabler, MD;  Location: [place];  Service: Gastroenterology;  Laterality: N/A;  . CERVICAL CONE BIOPSY    . [place]; 1996; 1998; 2014  . CESAREAN SECTION WITH BILATERAL TUBAL LIGATION Bilateral 10/28/2012   Procedure: Repeat cesarean section with delivery of baby boy at 83. Apgars 8/9.  BILATERAL TUBAL LIGATION;  Surgeon: Florian Buff, MD;  Location: [facility];  Service: Obstetrics;  Laterality: Bilateral;  . DILATION AND CURETTAGE OF UTERUS    . ESOPHAGOGASTRODUODENOSCOPY N/A 05/27/2016   Procedure:  ESOPHAGOGASTRODUODENOSCOPY (EGD);  Surgeon: Doran Stabler, MD;  Location: [facility];  Service: Gastroenterology;  Laterality: N/A;  . ESOPHAGOGASTRODUODENOSCOPY (EGD) WITH PROPOFOL N/A 04/23/2016   Procedure: ESOPHAGOGASTRODUODENOSCOPY (EGD) WITH PROPOFOL;  Surgeon: Doran Stabler, MD;  Location: [place];  Service: Endoscopy;  Laterality: N/A;  . ESOPHAGOGASTRODUODENOSCOPY (EGD) WITH PROPOFOL N/A 01/03/2017   Procedure: ESOPHAGOGASTRODUODENOSCOPY (EGD) WITH PROPOFOL;  Surgeon: Doran Stabler, MD;  Location: [facility];  Service: Gastroenterology;  Laterality: N/A;  . ESOPHAGOGASTRODUODENOSCOPY (EGD) WITH PROPOFOL N/A 04/21/2017   Procedure: ESOPHAGOGASTRODUODENOSCOPY (EGD) WITH PROPOFOL;  Surgeon: Doran Stabler, MD;  Location: [place];  Service: Gastroenterology;  Laterality: N/A;  . ESOPHAGOGASTRODUODENOSCOPY (EGD) WITH PROPOFOL N/A 05/02/2019   Procedure: ESOPHAGOGASTRODUODENOSCOPY (EGD) WITH PROPOFOL;  Surgeon: Irene Shipper, MD;  Location: [facility];  Service: Endoscopy;  Laterality: N/A;  . GASTROSTOMY N/A 08/16/2016   Procedure: LAPAROSCOPIC INSERTION OF GASTROSTOMY TUBE IN REMNANT STOMACH;  Surgeon: Arta Bruce Kinsinger, MD;  Location: [facility];  Service: General;  Laterality: N/A;  . GASTROSTOMY N/A 05/16/2017   Procedure: Replacement of  GASTROSTOMY TUBE;  Surgeon: Kieth Brightly Arta Bruce, MD;  Location: [facility];  Service: General;  Laterality: N/A;  . LAPAROSCOPIC INSERTION GASTROSTOMY TUBE Left 05/04/2019   Procedure: LAPAROSCOPIC INSERTION GASTROSTOMY TUBE;  Surgeon: Kieth Brightly Arta Bruce, MD;  Location: [place];  Service: General;  Laterality: Left;  . LAPAROSCOPIC REVISION OF GASTROJEJUNOSTOMY Left 05/16/2017   Procedure: LAPAROSCOPIC REVISION OF GASTROJEJUNOSTOMY;  Surgeon: Kinsinger, Arta Bruce, MD;  Location: [facility];  Service: General;  Laterality: Left;  . ROUX-EN-Y GASTRIC BYPASS  2007  . TUBAL LIGATION  2014     reports that she quit smoking about 7 years  ago. Her smoking use included cigarettes. She has a 2.00 pack-year smoking history. She has never used smokeless tobacco. She reports current alcohol use of about 17.0 standard drinks of alcohol per week. She reports that she does not use drugs.  Allergies  Allergen Reactions  . Iron Anaphylaxis    Had acute allergy with tachycardia, attention and generalized itching shortly after receiving nulecit ( iron gluconate) infusion.  . Lactose Intolerance ([place])     Family History  Problem Relation Age of Onset  . Diabetes Father   . Heart disease Father   . Depression Maternal Grandmother   . Heart disease Maternal Grandfather   . Depression Paternal Grandmother   . Colon cancer Paternal Grandfather   . Pancreatic cancer Paternal Grandfather     Prior to Admission medications   Medication Sig Start Date End Date Taking? Authorizing Provider  ALPRAZolam Duanne Moron) 1 MG tablet Take 1 tablet (1 mg total) by mouth at bedtime as needed for anxiety. Patient taking differently: Take 1 mg by mouth 3 (three) times daily.  08/27/16  Yes Lauree Chandler, NP  CARAFATE 1 GM/10ML suspension Take 10 mLs by mouth 3 (three) times daily before meals. 01/04/18  Yes  [provider]  cyclobenzaprine (FLEXERIL) 10 MG tablet Take 10 mg by mouth 3 (three) times daily. 12/28/17  Yes [provider]  GAS RELIEF 20 MG/0.3ML drops Take 1 mL by mouth every 6 (six) hours as needed for flatulence.  11/25/17  Yes [provider]  lidocaine (XYLOCAINE) 5 % ointment Apply 1 application topically 3 (three) times daily. Apply to Bilateral shoulders 05/11/16  Yes [provider]  lisinopril-hydrochlorothiazide (ZESTORETIC) 10-12.5 MG tablet Take 1 tablet by mouth daily. 04/16/19  Yes [provider]  Multiple Vitamins-Minerals (MULTIVITAMIN WITH IRON-MINERALS) liquid Take 5 mLs by mouth daily.   Yes [provider]  OLANZapine (ZYPREXA) 2.5 MG tablet Take 2.5 mg by mouth daily. 12/25/17   Yes [provider]  oxyCODONE (ROXICODONE) 15 MG immediate release tablet Take 15 mg by mouth every 6 (six) hours as needed for pain.   Yes [provider]  oxymetazoline (AFRIN) 0.05 % nasal spray Place 2 sprays into both nostrils 2 (two) times daily as needed for congestion.   Yes [provider]  pantoprazole (PROTONIX) 40 MG tablet Take 1 tablet (40 mg total) by mouth at bedtime. 05/11/19  Yes Focht, Jessica L, PA  QUEtiapine (SEROQUEL) 50 MG tablet Take 50 mg by mouth at bedtime. 04/06/19  Yes [provider]  oxyCODONE 10 MG TABS Take 1 tablet (10 mg total) by mouth every 6 (six) hours as needed for severe pain. Patient not taking: Reported on 05/12/2019 05/11/19   Kalman Drape, [place]    Physical Exam: Vitals:   05/13/19 0130 05/13/19 0206 05/13/19 0300 05/13/19 0330  BP: (!) 85/60 (!) 86/59 (!) 74/53 95/74  Pulse: 91 82 81 88  Resp: 16 16 16 17   Temp:      TempSrc:      SpO2: 100% 100% 100% 100%    Constitutional: NAD, calm, comfortable Vitals:   05/13/19 0130 05/13/19 0206 05/13/19 0300 05/13/19 0330  BP: (!) 85/60 (!) 86/59 (!) 74/53 95/74  Pulse: 91 82 81 88  Resp: 16 16 16 17   Temp:      TempSrc:      SpO2: 100% 100% 100% 100%    General: Patient looks very cachectic and older than the stated age.  Patient is not in acute distress at this time. Eyes: PERRL, lids and conjunctivae normal ENMT: Mucous membranes are moist. Posterior pharynx clear of any exudate or lesions.Normal dentition.  Neck: normal, supple, no masses, no thyromegaly Respiratory: clear to auscultation bilaterally, no wheezing, no crackles. Normal respiratory effort. No accessory muscle use.  Cardiovascular: Regular rate and rhythm, no murmurs / rubs / gallops. No extremity edema. 2+ pedal pulses. No carotid bruits.  Abdomen: no tenderness, no masses palpated. No hepatosplenomegaly. Bowel sounds positive.  Thin yellowish discharge coming from the G-tube site and there  is also soreness of skin around G-tube insertion. Musculoskeletal: no clubbing / cyanosis. No joint deformity upper and lower extremities. Good ROM, no contractures. Normal muscle tone.  Skin: Sore area and ulceration around the area of G-tube insertion but no signs of infection or pus. Neurologic: CN 2-12 grossly intact. Sensation intact, DTR normal. Strength 5/5 in all 4.  Psychiatric: Normal judgment and insight. Alert and oriented x 3. Normal mood.     Labs on Admission: I have personally reviewed following labs and imaging studies  CBC: Recent Labs  Lab 05/08/19 0238 05/08/19 0238 05/09/19 0419 05/10/19 0205 05/11/19 0329 05/12/19 1633 05/12/19 1708  WBC 5.0  --  6.0 6.1 5.8 6.8  --   HGB 6.2*   < > 8.9* 10.3* 10.4* 12.2 12.6  HCT 18.5*   < > 26.6* 31.0* 30.9* 38.0 37.0  MCV 102.8*  --  96.4 95.4 96.6 101.3*  --   PLT 83*  --  95* 108* 133* 221  --    < > = values in this interval not displayed.   Basic Metabolic Panel: Recent Labs  Lab 05/07/19 0305 05/07/19 0305 05/08/19 0238 05/08/19 1527 05/09/19 0419 05/10/19 0205 05/11/19 0329 05/12/19 1633 05/12/19 1708  NA 140   < >  --  138  --  138 138 140 140  K 4.1   < >  --  4.3  --  4.5 4.3 4.3 4.3  CL 114*   < >  --  110  --  109 108 105 105  CO2 20*  --   --  22  --  21* 25 26  --   GLUCOSE 72   < >  --  64*  --  131* 64* 89 88  BUN 18   < >  --  12  --  10 14 23* 22*  CREATININE 1.02*   < >  --  0.77  --  0.82 0.82 0.85 0.90  CALCIUM 7.8*  --   --  7.8*  --  8.0* 7.9* 8.9  --   MG 1.9  --  1.7  --  1.7 1.6* 2.0  --   --   PHOS 3.9  --  2.9  --  3.2 2.6 2.8  --   --    < > = values in this interval not displayed.   GFR: Estimated Creatinine Clearance: 52.5 mL/min (by C-G formula based on SCr of 0.9 mg/dL). Liver Function Tests: Recent Labs  Lab 05/08/19 1527 05/10/19 0205 05/12/19 1633  AST 36 24 24  ALT 33 29 29  ALKPHOS 132* 151* 154*  BILITOT 0.7 1.1 0.9  PROT 4.6* 5.0* 7.1  ALBUMIN 1.8* 2.0*  3.1*   No results for input(s): LIPASE, AMYLASE in the last 168 hours. No results for input(s): AMMONIA in the last 168 hours. Coagulation Profile: No results for input(s): INR, PROTIME in the last 168 hours. Cardiac Enzymes: No results for input(s): CKTOTAL, CKMB, CKMBINDEX, TROPONINI in the last 168 hours. BNP (last 3 results) No results for input(s): PROBNP in the last 8760 hours. HbA1C: No results for input(s): HGBA1C in the last 72 hours. CBG: Recent Labs  Lab 05/12/19 1801 05/12/19 1859 05/12/19 1938 05/12/19 2046 05/13/19 0119  GLUCAP 80 24* 36* 145* 112*   Lipid Profile: No results for input(s): CHOL, HDL, LDLCALC, TRIG, CHOLHDL, LDLDIRECT in the last 72 hours. Thyroid Function Tests: No results for input(s): TSH, T4TOTAL, FREET4, T3FREE, THYROIDAB in the last 72 hours. Anemia Panel: No results for input(s): VITAMINB12, FOLATE, FERRITIN, TIBC, IRON, RETICCTPCT in the last 72 hours. Urine analysis:    Component Value Date/Time   COLORURINE YELLOW 05/12/2019 1937   APPEARANCEUR CLEAR 05/12/2019 1937   LABSPEC 1.005 05/12/2019 1937   PHURINE 6.0 05/12/2019 1937   GLUCOSEU >=500 (A) 05/12/2019 1937   HGBUR NEGATIVE 05/12/2019 1937   BILIRUBINUR NEGATIVE 05/12/2019 1937   KETONESUR NEGATIVE 05/12/2019 1937   PROTEINUR NEGATIVE 05/12/2019 1937   UROBILINOGEN 0.2 10/26/2012 1115   NITRITE NEGATIVE 05/12/2019 1937   LEUKOCYTESUR NEGATIVE 05/12/2019 1937    Radiological Exams on Admission: DG ABDOMEN PEG TUBE LOCATION  Result Date: 05/12/2019 CLINICAL  DATA:  The patient complains that her G-tube is leaking. EXAM: ABDOMEN - 1 VIEW COMPARISON:  May 08, 2019 FINDINGS: The bowel gas pattern is normal. Oral contrast has been injected through the gastrostomy tube and outlines the nearly decompressed stomach. No extraluminal contrast is noted on this single frontal view. IMPRESSION: 1. Nonobstructive bowel gas pattern. 2. Gastrostomy tube in satisfactory position.  Electronically Signed   By: Fidela Salisbury M.D.   On: 05/12/2019 18:40   DG Chest Portable 1 View  Result Date: 05/12/2019 CLINICAL DATA:  Hypoglycemia EXAM: PORTABLE CHEST 1 VIEW COMPARISON:  April 29, 2019 FINDINGS: The heart size and mediastinal contours are within normal limits. Both lungs are clear. The visualized skeletal structures are unremarkable. IMPRESSION: No active disease. Electronically Signed   By: Constance Holster M.D.   On: 05/12/2019 20:59      Assessment/Plan Principal Problem:   Drainage from gastrostomy tube site [facility]) Wound nurse consultation ordered. General surgery was contacted by ED physician and they recommended DuoDERM and wound care consult at this time and if this does not work they will upsize the position of G-tube to prevent further drainage from the tube. Cephalexin p.o. 500 mg twice daily ordered. Tylenol for pain.  Active Problems:   Hypoglycemia Patient had multiple episodes of severe hypoglycemia and was managed with D50 and IV dextrose water.  Previously work-up was done to rule out insulinoma. Continue to monitor    Failure to thrive in adult Patient is very cachectic and has severe protein caloric malnutrition. Will continue tube feeds and encourage p.o. intake with protein rich shakes. PT/OT consult ordered.     DVT prophylaxis: Lovenox Code Status: Full code Consults called: Wound care, PT/OT. Admission status: Obs/MedSurg   Edmonia Lynch MD [facility] Pager [phone]   If 7PM-7AM, please contact night-coverage [email or web address] Password   05/13/2019, 3:51 AM

## 2019-05-13 NOTE — Plan of Care (Signed)
54 year old female with complex medical history including hypertension type 2 diabetes gastric bypass surgery with longstanding complications, migraine, depression failure to thrive chronic G-tube came in with complaints of discharge from the G-tube site.  Patient had recent hospital admission for electrolyte abnormalities and hypoglycemia due to decreased p.o. intake so a G-tube was placed on January 8 of 2021 and patient was discharged home yesterday morning.  She was brought to the ED for concern of possible overdose given Narcan and was discharged home.  Today she is up on the floor complaining of 10 out of 10 pain requesting to be restarted on narcotics. Surgery consulted and following.  Plan for upsizing of the G-tube if patient continues to have leakage around the G-tube.

## 2019-05-13 NOTE — Evaluation (Signed)
Physical Therapy Evaluation Patient Details Name: Angie [name] MRN: [id number] DOB: Sep 26, 1965 Today's Date: 05/13/2019   History of Present Illness   Angie [name] is a 54 y.o. 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  Clinical Impression  Pt admitted as above and presenting with functional mobility limitations 2* generalized weakness and ambulatory balance deficits.  Pt should progress to dc home with family assist.    Follow Up Recommendations Home health PT    Equipment Recommendations       Recommendations for Other Services       Precautions / Restrictions Precautions Precautions: Fall Restrictions Weight Bearing Restrictions: No      Mobility  Bed Mobility               General bed mobility comments: Pt up on BSC on arrival to room  Transfers Overall transfer level: Needs assistance Equipment used: Rolling walker (2 wheeled) Transfers: Sit to/from Stand Sit to Stand: Min guard         General transfer comment: steady assist only; pt self-cues for use of UEs to self assist  Ambulation/Gait Ambulation/Gait assistance: Min guard Gait Distance (Feet): 200 Feet(twice with standing rest break bewteen) Assistive device: Rolling walker (2 wheeled) Gait Pattern/deviations: Step-through pattern;Decreased stride length;Trunk flexed Gait velocity: decreased   General Gait Details: cues for posture and position from [employer] Mobility    Modified Rankin (Stroke Patients Only)       Balance Overall balance assessment: Needs assistance Sitting-balance support: No upper extremity supported;Feet supported Sitting balance-Leahy Scale: Normal     Standing balance support: During  functional activity;Bilateral upper extremity supported;No upper extremity supported Standing balance-Leahy Scale: Fair Standing balance comment: able to stand without UE support statically, but requires B UE support dynamically                             Pertinent Vitals/Pain Pain Assessment: Faces Faces Pain Scale: Hurts little more Pain Location: L neck/shoulder 2* IV placement Pain Descriptors / Indicators: Aching;Sore Pain Intervention(s): Limited activity within patient's tolerance;Monitored during session    [place] expects to be discharged to:: Private residence Living Arrangements: Spouse/significant other Available Help at Discharge: Family;Available PRN/intermittently Type of Home: House Home Access: Stairs to enter Entrance Stairs-Rails: Right;Left Entrance Stairs-Number of Steps: 5 Home Layout: Two level;Able to live on main level with bedroom/bathroom Home Equipment: Gilford Rile - 2 wheels Additional Comments: pt lives with her fiance and 81 yo son. Fiance does not work. Her older children help out too if needed    Prior Function Level of Independence: Needs assistance   Gait / Transfers Assistance Needed: Pt ambulating with RW           Hand Dominance   Dominant Hand: Right    Extremity/Trunk Assessment   Upper Extremity Assessment Upper Extremity Assessment: Defer to OT evaluation    Lower Extremity Assessment Lower Extremity Assessment: Generalized weakness    Cervical / Trunk Assessment Cervical / Trunk Assessment: Normal  Communication   Communication: No difficulties  Cognition Arousal/Alertness: Awake/alert Behavior During Therapy: WFL for tasks assessed/performed Overall Cognitive Status: Within Functional Limits for tasks assessed  General Comments      Exercises     Assessment/Plan    PT Assessment Patient needs continued PT services  PT Problem  List Decreased strength;Decreased activity tolerance;Decreased balance;Decreased mobility;Decreased knowledge of use of DME;Decreased knowledge of precautions;Pain;Cardiopulmonary status limiting activity       PT Treatment Interventions DME instruction;Gait training;Stair training;Functional mobility training;Therapeutic activities;Therapeutic exercise;Balance training;Patient/family education    PT Goals (Current goals can be found in the Care Plan section)  Acute Rehab PT Goals Patient Stated Goal: return home PT Goal Formulation: With patient Time For Goal Achievement: 05/27/19 Potential to Achieve Goals: Good    Frequency Min 3X/week   Barriers to discharge        Co-evaluation PT/OT/SLP Co-Evaluation/Treatment: Yes Reason for Co-Treatment: To address functional/ADL transfers;For patient/therapist safety PT goals addressed during session: Mobility/safety with mobility OT goals addressed during session: ADL's and self-care       AM-PAC PT "6 Clicks" Mobility  Outcome Measure Help needed turning from your back to your side while in a flat bed without using bedrails?: None Help needed moving from lying on your back to sitting on the side of a flat bed without using bedrails?: A Little Help needed moving to and from a bed to a chair (including a wheelchair)?: A Little Help needed standing up from a chair using your arms (e.g., wheelchair or bedside chair)?: A Little Help needed to walk in hospital room?: A Little Help needed climbing 3-5 steps with a railing? : A Little 6 Click Score: 19    End of Session Equipment Utilized During Treatment: Gait belt Activity Tolerance: Patient tolerated treatment well Patient left: with call bell/phone within reach;in chair;with chair alarm set Nurse Communication: Mobility status PT Visit Diagnosis: Unsteadiness on feet (R26.81);Muscle weakness (generalized) (M62.81)    Time: [id number] PT Time Calculation (min) (ACUTE ONLY): 33  min   Charges:   PT Evaluation $PT Eval Low Complexity: 1 Low          [place] Pager [phone] Office [phone]   Lori Popowski 05/13/2019, 4:24 PM

## 2019-05-13 NOTE — Progress Notes (Signed)
Patient with frequent requests about medications, antibiotics, care of her G-tube, as well as MD rounding.    Clarified with patient about the plan of care, including G-tube care, normal white blood cell count, current medications, plan of care for her pain and anxiety medications.   Followed up with MD as well at this time per patient request.   Will write down patient plan of care for her.     [place], Therapist, sports

## 2019-05-14 ENCOUNTER — Inpatient Hospital Stay: Payer: Self-pay

## 2019-05-14 LAB — GLUCOSE, CAPILLARY
Glucose-Capillary: 106 mg/dL — ABNORMAL HIGH (ref 70–99)
Glucose-Capillary: 109 mg/dL — ABNORMAL HIGH (ref 70–99)
Glucose-Capillary: 114 mg/dL — ABNORMAL HIGH (ref 70–99)
Glucose-Capillary: 170 mg/dL — ABNORMAL HIGH (ref 70–99)
Glucose-Capillary: 45 mg/dL — ABNORMAL LOW (ref 70–99)
Glucose-Capillary: 56 mg/dL — ABNORMAL LOW (ref 70–99)
Glucose-Capillary: 69 mg/dL — ABNORMAL LOW (ref 70–99)
Glucose-Capillary: 75 mg/dL (ref 70–99)
Glucose-Capillary: 85 mg/dL (ref 70–99)
Glucose-Capillary: 89 mg/dL (ref 70–99)

## 2019-05-14 LAB — URINE CULTURE

## 2019-05-14 LAB — LACTIC ACID, PLASMA
Lactic Acid, Venous: 1.1 mmol/L (ref 0.5–1.9)
Lactic Acid, Venous: 1.6 mmol/L (ref 0.5–1.9)

## 2019-05-14 LAB — VITAMIN K1, SERUM: VITAMIN K1: 0.24 ng/mL (ref 0.13–1.88)

## 2019-05-14 MED ORDER — ADULT MULTIVITAMIN W/MINERALS CH
1.0000 | ORAL_TABLET | Freq: Every day | ORAL | Status: DC
  Administered 2019-05-14 – 2019-05-18 (×5): 1 via ORAL
  Filled 2019-05-14 (×5): qty 1

## 2019-05-14 MED ORDER — CHLORHEXIDINE GLUCONATE CLOTH 2 % EX PADS
6.0000 | MEDICATED_PAD | Freq: Every day | CUTANEOUS | Status: DC
  Administered 2019-05-14 – 2019-05-18 (×4): 6 via TOPICAL

## 2019-05-14 MED ORDER — SODIUM CHLORIDE 0.9% FLUSH: 10.0000 mL | INTRAVENOUS | Status: DC | PRN

## 2019-05-14 MED ORDER — BOOST / RESOURCE BREEZE PO LIQD CUSTOM
1.0000 | Freq: Two times a day (BID) | ORAL | Status: DC
  Administered 2019-05-14 – 2019-05-18 (×8): 1 via ORAL

## 2019-05-14 MED ORDER — GLUCOSE 40 % PO GEL
ORAL | Status: AC
  Administered 2019-05-14: 1
  Filled 2019-05-14: qty 1

## 2019-05-14 MED ORDER — PRO-STAT SUGAR FREE PO LIQD
30.0000 mL | Freq: Two times a day (BID) | ORAL | Status: DC
  Administered 2019-05-14 – 2019-05-18 (×8): 30 mL via ORAL
  Filled 2019-05-14 (×7): qty 30

## 2019-05-14 NOTE — Progress Notes (Addendum)
@   2146 pt BP 102/74 and pulse 116. Pt was alert and stated she was in pain, so RN gave scheduled flexeril, seroquel and PRN xanax. Vitals rechecked @2310  to reassess pulse. BP was 66/44 pulse was 118. At this time pt was lethargic and minimally responsive. CBG was checked and noted to be 54.  Pt arousable enough to drink juice, and CBG came up to 133. On-call MD Bodenheimer paged. MD ordered 1L NS bolus and 1 ampule of D50%.  After amp given, CBG was 259. Rapid response RN called to assess pt due to mental status.   CBG 75 @ 0031. Pt noted to be alert again, asking for the bedpan. Bodenheimer paged to notify of CBG drop, no CBG orders, and a low grade temp of 100.0 rectally. MD ordered increase IV fluids to 150 ml/hr, labs, and CBG q 4. BP currently 83/50, pulse 105. Red MEWs protocol initiated. Will continue to monitor.

## 2019-05-14 NOTE — Progress Notes (Signed)
Peripherally Inserted Central Catheter/Midline Placement  The IV Nurse has discussed with the patient and/or persons authorized to consent for the patient, the purpose of this procedure and the potential benefits and risks involved with this procedure.  The benefits include less needle sticks, lab draws from the catheter, and the patient may be discharged home with the catheter. Risks include, but not limited to, infection, bleeding, blood clot (thrombus formation), and puncture of an artery; nerve damage and irregular heartbeat and possibility to perform a PICC exchange if needed/ordered by physician.  Alternatives to this procedure were also discussed.  Bard Power PICC patient education guide, fact sheet on infection prevention and patient information card has been provided to patient /or left at bedside.    PICC/Midline Placement Documentation  PICC Single Lumen 05/14/19 PICC Left Brachial 37 cm 4 cm (Active)  Indication for Insertion or Continuance of Line Poor Vasculature-patient has had multiple peripheral attempts or PIVs lasting less than 24 hours 05/14/19 1210  Exposed Catheter (cm) 0 cm 05/14/19 1210  Site Assessment Clean;Dry;Intact 05/14/19 1210  Line Status Flushed;Blood return noted 05/14/19 1210  Dressing Type Transparent 05/14/19 1210  Dressing Status Clean;Dry;Intact;Antimicrobial disc in place;Other (Comment) 05/14/19 1210  Dressing Intervention New dressing 05/14/19 1210  Dressing Change Due 05/21/19 05/14/19 1210       Angie [name] 05/14/2019, 12:11 PM

## 2019-05-14 NOTE — Progress Notes (Signed)
Weight:   Wt Readings from Last 1 Encounters:  05/11/19 49.1 kg    Ideal Body Weight:  45.4 kg  BMI:  Body mass index is 21.14 kg/m.  Estimated Nutritional Needs:   Kcal:  1620-1815 kcal  Protein:  80-90 grams  Fluid:  >/= 1.8 L/day     Jarome Matin, MS, RD, LDN, [facility] Inpatient Clinical Dietitian Pager # [phone] After hours/weekend pager # [phone]

## 2019-05-14 NOTE — Consult Note (Addendum)
[place] Nurse Consult Note: Reason for Consult: Consult requested for G-tube site.  Assessed with CCS PA at the bedside. Wound type: G-tube is eroding around the insertion site and has mod amt tan drainage.  There is also a full thickness wound: .5X.5X.2cm, red and moist to 9:00 o'clock under the rubber bumper.  Dressing procedure/placement/frequency: Topical treatment will be minimally effective to promote healing while the tube is leaking. Topical treatment orders provided for bedside nurses to perform as follows to protect skin and repel moisture: BID CARE: Cleanse G- TUBE site with soap and water, then apply Desitin ointment, and cover with split thickness gauze. Please re-consult if further assistance is needed.  Thank-you,  Julien Girt MSN, [place], [place], [place], [place]

## 2019-05-14 NOTE — Plan of Care (Signed)

## 2019-05-14 NOTE — Progress Notes (Signed)
Pt CBG 56 this AM. Pt given juice and will recheck. Dr Dan Humphreys and aware. Day RN notified and handoff given.

## 2019-05-14 NOTE — Progress Notes (Signed)
IV RN recommended to patient RN that patient have central access due to patient receiving D10 continuously.  IV RN explained to patient RN that D10 is very caustic to the veins.  Patient RN verbalized RN and plans to message MD.  Patient currently with R EJ infusing D10 at 150 ml/hr.

## 2019-05-14 NOTE — Progress Notes (Signed)
PROGRESS NOTE    Angie [name]  [id number] DOB: 12/13/1965 DOA: 05/12/2019 PCP: Harvie Junior, MD   Brief Narrative: 54 y.o. 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  Patient states that the problem started about 8 days ago at the site of G-tube and continue to worsen and now she is having severe drainage around the site of G-tube whenever she has tube feeds.  Patient is also complaining of soreness and mild tenderness around the area where G-tube is placed.  Patient states that she is able to tolerate p.o. food and denies any fever, chills, chest pain, nausea, vomiting and urinary symptoms.  Patient was recently admitted for hypoglycemia and profound electrolyte abnormalities and G-tube was placed on May 04, 2019 and patient was discharged home yesterday morning.  Patient also returned to the ED yesterday evening with a concern for possible overdose and she was given Narcan and discharged to home after her condition improved.  ED Course: On arrival to ED, patient had blood pressure of 107/81, heart rate 93, temperature 97.6, respiratory rate 17 and oxygen saturation 100% on room air.  Point-of-care blood glucose was 34 while BMP showed blood glucose of 82.  ALT was elevated and albumin 3.1.  Chest x-ray was negative for acute pulmonary problem.  Patient was given multiple doses of D50 in the ED for hypoglycemia and general surgery was also contacted for leakage from G-tube.  General surgery recommended to use DuoDERM and wound care consult at this time and if this does not help then they will try to upsize the G-tube to prevent further leakage.  05/14/2019-patient had episodes of hypoglycemia overnight.  Her initial CBG was 54.  After drinking orange juice it came up to 133.   Patient upset this morning that she did not get her narcotics while she was hypoglycemic.  Assessment & Plan:   Principal Problem:   Leaking at G tube site Active Problems:   History of Roux-en-Y gastric bypass 2007   Hypoglycemia   Intractable nausea and vomiting   Abdominal pain, chronic, epigastric   Protein-calorie malnutrition, severe   Malnutrition following gastric bypass surgery   Hx of laparoscopic gastrostomy tube insertion 05/04/2019   Failure to thrive in adult   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HCC)   #1 leakage from the PEG tube site-patient had PEG placed this month due to being malnourished with severe electrolyte abnormalities and not enough p.o. intake and failure to thrive.  Followed by GI still trying to figure out if we need to upsize chest tube.  Patient continues to have leakage around the PEG site.  Patient is also having p.o. intake. On Keflex.  #2 hypoglycemia with multiple episodes of severe hypoglycemia.  Looking through the records work-up was done in the past to rule out insulinoma. Patient has a history of type 2 diabetes not on any medications at home or here. Once her PEG tube issues has been resolved we should be able to start her on tube feeds which hopefully will take care of hypoglycemia.  Continue D10 for now. She does not have an IV line and will need PICC line placed.  #3 chronic pain issues-patient was brought to the ER a day before she was admitted for possible overdose on narcotics.  She was given Narcan and sent back home.  Currently while  in the hospital she is complaining of 10 out of 10 pain at the gastric tube site and requesting pain medications.  She is now on oxycodone she should not get a prescription at the time of discharge for this.  #4 hypertension blood pressure soft 112/88 she takes lisinopril hydrochlorothiazide 10 x 12.5 at home which has been on hold  #5 depression continue Seroquel and Zyprexa    Estimated body mass index is  21.14 kg/m as calculated from the following:   Height as of this encounter: 5' (1.524 m).   Weight as of 05/11/19: 49.1 kg.  DVT prophylaxis: Lovenox Code Status: Full code  family Communication: None Disposition Plan pending correction of PEG tube issues prior to discharge Consultants:   General surgery  Procedures: None  antimicrobials: Keflex  subjective: She is resting in bed upset and anxious about why she did not get her narcotics last night while she was hypoglycemic denies any nausea vomiting continue to have pain at the site of the PEG tube  Objective: Vitals:   05/14/19 0146 05/14/19 0241 05/14/19 0348 05/14/19 0803  BP: (!) 85/60 115/83 109/82 112/88  Pulse: 93 91 98 98  Resp: 18 15 16 16   Temp: 98.5 F (36.9 C) 98.4 F (36.9 C) 98.2 F (36.8 C) 97.7 F (36.5 C)  TempSrc: Oral Oral Oral Oral  SpO2: 100% 100% 100% 100%  Height:        Intake/Output Summary (Last 24 hours) at 05/14/2019 1157 Last data filed at 05/14/2019 1000 Gross per 24 hour  Intake 1600.02 ml  Output 775 ml  Net 825.02 ml   There were no vitals filed for this visit.  Examination:  General exam: Appears calm and comfortable  Respiratory system: Clear to auscultation. Respiratory effort normal. Cardiovascular system: S1 & S2 heard, RRR. No JVD, murmurs, rubs, gallops or clicks. No pedal edema. Gastrointestinal system: Abdomen is nondistended, soft and nontender. No organomegaly or masses felt. Normal bowel sounds heard.  PEG in place covered with dressings with no drainage seen. Central nervous system: Alert and oriented. No focal neurological deficits. Extremities: Symmetric 5 x 5 power. Skin: No rashes, lesions or ulcers Psychiatry: Judgement and insight appear normal. Mood & affect appropriate.     Data Reviewed: I have personally reviewed following labs and imaging studies  CBC: Recent Labs  Lab 05/09/19 0419 05/09/19 0419 05/10/19 0205 05/11/19 0329 05/12/19 1633  05/12/19 1708 05/13/19 0436  WBC 6.0  --  6.1 5.8 6.8  --  7.6  HGB 8.9*   < > 10.3* 10.4* 12.2 12.6 10.7*  HCT 26.6*   < > 31.0* 30.9* 38.0 37.0 33.7*  MCV 96.4  --  95.4 96.6 101.3*  --  100.6*  PLT 95*  --  108* 133* 221  --  238   < > = values in this interval not displayed.   Basic Metabolic Panel: Recent Labs  Lab 05/08/19 0238 05/08/19 1527 05/08/19 1527 05/09/19 0419 05/10/19 0205 05/11/19 0329 05/12/19 1633 05/12/19 1708 05/13/19 0436  NA  --  138   < >  --  138 138 140 140 140  K  --  4.3   < >  --  4.5 4.3 4.3 4.3 4.6  CL  --  110   < >  --  109 108 105 105 110  CO2  --  22  --   --  21* 25 26  --  23  GLUCOSE  --  64*   < >  --  131* 64* 89 88 65*  BUN  --  12   < >  --  10 14 23* 22* 21*  CREATININE  --  0.77   < >  --  0.82 0.82 0.85 0.90 0.59  CALCIUM  --  7.8*  --   --  8.0* 7.9* 8.9  --  8.1*  MG 1.7  --   --  1.7 1.6* 2.0  --   --   --   PHOS 2.9  --   --  3.2 2.6 2.8  --   --   --    < > = values in this interval not displayed.   GFR: Estimated Creatinine Clearance: 59.1 mL/min (by C-G formula based on SCr of 0.59 mg/dL). Liver Function Tests: Recent Labs  Lab 05/08/19 1527 05/10/19 0205 05/12/19 1633 05/13/19 0436  AST 36 24 24 23   ALT 33 29 29 23   ALKPHOS 132* 151* 154* 135*  BILITOT 0.7 1.1 0.9 0.2*  PROT 4.6* 5.0* 7.1 5.7*  ALBUMIN 1.8* 2.0* 3.1* 2.3*   No results for input(s): LIPASE, AMYLASE in the last 168 hours. No results for input(s): AMMONIA in the last 168 hours. Coagulation Profile: No results for input(s): INR, PROTIME in the last 168 hours. Cardiac Enzymes: No results for input(s): CKTOTAL, CKMB, CKMBINDEX, TROPONINI in the last 168 hours. BNP (last 3 results) No results for input(s): PROBNP in the last 8760 hours. HbA1C: No results for input(s): HGBA1C in the last 72 hours. CBG: Recent Labs  Lab 05/14/19 0031 05/14/19 0102 05/14/19 0711 05/14/19 0758 05/14/19 0824  GLUCAP 75 114* 45* 56* 109*   Lipid Profile: No  results for input(s): CHOL, HDL, LDLCALC, TRIG, CHOLHDL, LDLDIRECT in the last 72 hours. Thyroid Function Tests: No results for input(s): TSH, T4TOTAL, FREET4, T3FREE, THYROIDAB in the last 72 hours. Anemia Panel: No results for input(s): VITAMINB12, FOLATE, FERRITIN, TIBC, IRON, RETICCTPCT in the last 72 hours. Sepsis Labs: Recent Labs  Lab 05/14/19 0128 05/14/19 0322  LATICACIDVEN 1.1 1.6    Recent Results (from the past 240 hour(s))  Urine culture     Status: Abnormal   Collection Time: 05/12/19  7:37 PM   Specimen: Urine, Random  Result Value Ref Range Status   Specimen Description   Final    URINE, RANDOM Performed at [facility] [address] [place], [place] [address]    Special Requests   Final    NONE Performed at [facility], [place] [address], [place], [place] [address]    Culture MULTIPLE SPECIES PRESENT, SUGGEST RECOLLECTION (A)  Final   Report Status 05/14/2019 FINAL  Final  SARS CORONAVIRUS 2 (TAT 6-24 HRS) Nasopharyngeal Nasopharyngeal Swab     Status: None   Collection Time: 05/12/19  9:31 PM   Specimen: Nasopharyngeal Swab  Result Value Ref Range Status   SARS Coronavirus 2 NEGATIVE NEGATIVE Final    Comment: (NOTE) SARS-CoV-2 target nucleic acids are NOT DETECTED. The SARS-CoV-2 RNA is generally detectable in upper and lower respiratory specimens during the acute phase of infection. Negative results do not preclude SARS-CoV-2 infection, do not rule out co-infections with other pathogens, and should not be used as the sole basis for treatment or other patient management decisions. Negative results must be combined with clinical observations, patient history, and epidemiological information. The expected result is Negative. Fact Sheet for Patients: SugarRoll.be Fact Sheet for Healthcare Providers: https://www.woods-.com/ This test is not yet approved or cleared by the  [place]  States FDA and  has been authorized for detection and/or diagnosis of SARS-CoV-2 by FDA under an Emergency Use Authorization (EUA). This EUA will remain  in effect (meaning this test can be used) for the duration of the COVID-19 declaration under Section 56 4(b)(1) of the Act, 21 U.S.C. section 360bbb-3(b)(1), unless the authorization is terminated or revoked sooner. Performed at [facility], [place] [address], [place], [place] [address]          Radiology Studies: DG ABDOMEN PEG TUBE LOCATION  Result Date: 05/12/2019 CLINICAL DATA:  The patient complains that her G-tube is leaking. EXAM: ABDOMEN - 1 VIEW COMPARISON:  May 08, 2019 FINDINGS: The bowel gas pattern is normal. Oral contrast has been injected through the gastrostomy tube and outlines the nearly decompressed stomach. No extraluminal contrast is noted on this single frontal view. IMPRESSION: 1. Nonobstructive bowel gas pattern. 2. Gastrostomy tube in satisfactory position. Electronically Signed   By: Fidela Salisbury M.D.   On: 05/12/2019 18:40   DG Chest Portable 1 View  Result Date: 05/12/2019 CLINICAL DATA:  Hypoglycemia EXAM: PORTABLE CHEST 1 VIEW COMPARISON:  April 29, 2019 FINDINGS: The heart size and mediastinal contours are within normal limits. Both lungs are clear. The visualized skeletal structures are unremarkable. IMPRESSION: No active disease. Electronically Signed   By: Constance Holster M.D.   On: 05/12/2019 20:59   [place] EKG SITE RITE  Result Date: 05/14/2019 If Site Rite image not attached, placement could not be confirmed due to current cardiac rhythm.       Scheduled Meds: . cephALEXin  500 mg Oral Q12H  . cyclobenzaprine  10 mg Oral TID  . dextrose      . enoxaparin (LOVENOX) injection  40 mg Subcutaneous QHS  . liver oil-zinc oxide   Topical BID  . OLANZapine  2.5 mg Oral Daily  . pantoprazole  40 mg Oral QHS  . QUEtiapine  50 mg Oral QHS  . sucralfate  1 g Oral TID AC    Continuous Infusions: . dextrose 150 mL/hr at 05/14/19 0100     LOS: 1 day     Georgette Shell, MD [facility]  If 7PM-7AM, please contact night-coverage [email or web address] Password [facility] 05/14/2019, 11:57 AM

## 2019-05-14 NOTE — Progress Notes (Addendum)
Subjective: - c/o abdominal pain near leaking G-tube site - eating grits - denies n/v/regurgitation - denies constipation/diarrhea  ROS: See above, otherwise other systems negative  Objective: Vital signs in last 24 hours: Temp:  [97.7 F (36.5 C)-100 F (37.8 C)] 97.7 F (36.5 C) (01/18 0803) Pulse Rate:  [91-118] 98 (01/18 0803) Resp:  [14-18] 16 (01/18 0803) BP: (66-115)/(44-88) 112/88 (01/18 0803) SpO2:  [99 %-100 %] 100 % (01/18 0803) Last BM Date: 05/13/19  Intake/Output from previous day: 01/17 0701 - 01/18 0700 In: 2684 [P.O.:720; I.V.:1964] Out: 175 [Urine:175] Intake/Output this shift: Total I/O In: 180 [P.O.:180] Out: 600 [Urine:600]  PE: General: alert, no distress, thin Lungs: normal effort Heart: normal rate and rhythm  Abdomen: g-tube in place, sutures not intact, small opening at inferior aspect of tube with small amount of thick, clear, drainage pooling in opening, no active drainage Small open wound 0.5cm x 0.5 cm (inferior to g-tube without drainage (see picture below) No cellulitis or major skin maceration Ext: pulses 2+      Lab Results:  Recent Labs    05/12/19 1633 05/12/19 1633 05/12/19 1708 05/13/19 0436  WBC 6.8  --   --  7.6  HGB 12.2   < > 12.6 10.7*  HCT 38.0   < > 37.0 33.7*  PLT 221  --   --  238   < > = values in this interval not displayed.   BMET Recent Labs    05/12/19 1633 05/12/19 1633 05/12/19 1708 05/13/19 0436  NA 140   < > 140 140  K 4.3   < > 4.3 4.6  CL 105   < > 105 110  CO2 26  --   --  23  GLUCOSE 89   < > 88 65*  BUN 23*   < > 22* 21*  CREATININE 0.85   < > 0.90 0.59  CALCIUM 8.9  --   --  8.1*   < > = values in this interval not displayed.   PT/INR No results for input(s): LABPROT, INR in the last 72 hours. CMP     Component Value Date/Time   NA 140 05/13/2019 0436   K 4.6 05/13/2019 0436   CL 110 05/13/2019 0436   CO2 23 05/13/2019 0436   GLUCOSE 65 (L) 05/13/2019 0436   BUN 21  (H) 05/13/2019 0436   CREATININE 0.59 05/13/2019 0436   CALCIUM 8.1 (L) 05/13/2019 0436   PROT 5.7 (L) 05/13/2019 0436   ALBUMIN 2.3 (L) 05/13/2019 0436   AST 23 05/13/2019 0436   ALT 23 05/13/2019 0436   ALKPHOS 135 (H) 05/13/2019 0436   BILITOT 0.2 (L) 05/13/2019 0436   GFRNONAA >60 05/13/2019 0436   GFRAA >60 05/13/2019 0436   Lipase     Component Value Date/Time   LIPASE 13 04/29/2019 1650       Studies/Results: DG ABDOMEN PEG TUBE LOCATION  Result Date: 05/12/2019 CLINICAL DATA:  The patient complains that her G-tube is leaking. EXAM: ABDOMEN - 1 VIEW COMPARISON:  May 08, 2019 FINDINGS: The bowel gas pattern is normal. Oral contrast has been injected through the gastrostomy tube and outlines the nearly decompressed stomach. No extraluminal contrast is noted on this single frontal view. IMPRESSION: 1. Nonobstructive bowel gas pattern. 2. Gastrostomy tube in satisfactory position. Electronically Signed   By: Fidela Salisbury M.D.   On: 05/12/2019 18:40   DG Chest Portable 1 View  Result Date: 05/12/2019 CLINICAL  DATA:  Hypoglycemia EXAM: PORTABLE CHEST 1 VIEW COMPARISON:  April 29, 2019 FINDINGS: The heart size and mediastinal contours are within normal limits. Both lungs are clear. The visualized skeletal structures are unremarkable. IMPRESSION: No active disease. Electronically Signed   By: Constance Holster M.D.   On: 05/12/2019 20:59    Anti-infectives: Anti-infectives (From admission, onward)   Start     Dose/Rate Route Frequency Ordered Stop   05/13/19 0415  cephALEXin (KEFLEX) capsule 500 mg     500 mg Oral Every 12 hours 05/13/19 0348         Assessment/Plan G-tube site leakage >> diarrhea, weight loss, anasarca consistent with malnutrition after bypass - Assessed with WOC nurse who recommends continuing Desitin and possibly upsizing tube, will review surgical note to see if we can upsize and will contact IR - Patient tolerating food by mouth  FEN -  Heart healthy VTE - Lovenox ID - Keflex per primary team   LOS: 1 day    Carlena Hurl , [facility] 05/14/2019, 10:00 AM

## 2019-05-15 ENCOUNTER — Inpatient Hospital Stay (HOSPITAL_COMMUNITY): Payer: Medicare Other

## 2019-05-15 HISTORY — PX: IR CM INJ ANY COLONIC TUBE W/FLUORO: IMG2336

## 2019-05-15 LAB — MAGNESIUM: Magnesium: 1.4 mg/dL — ABNORMAL LOW (ref 1.7–2.4)

## 2019-05-15 LAB — GLUCOSE, CAPILLARY
Glucose-Capillary: 105 mg/dL — ABNORMAL HIGH (ref 70–99)
Glucose-Capillary: 106 mg/dL — ABNORMAL HIGH (ref 70–99)
Glucose-Capillary: 64 mg/dL — ABNORMAL LOW (ref 70–99)
Glucose-Capillary: 82 mg/dL (ref 70–99)
Glucose-Capillary: 89 mg/dL (ref 70–99)
Glucose-Capillary: 89 mg/dL (ref 70–99)

## 2019-05-15 LAB — PHOSPHORUS: Phosphorus: 3.1 mg/dL (ref 2.5–4.6)

## 2019-05-15 MED ORDER — IOHEXOL 300 MG/ML  SOLN
50.0000 mL | Freq: Once | INTRAMUSCULAR | Status: AC | PRN
  Administered 2019-05-15: 10 mL

## 2019-05-15 MED ORDER — OSMOLITE 1.5 CAL PO LIQD
237.0000 mL | Freq: Three times a day (TID) | ORAL | Status: DC
  Administered 2019-05-15: 140 mL
  Administered 2019-05-16 – 2019-05-18 (×7): 237 mL
  Filled 2019-05-15 (×10): qty 237

## 2019-05-15 MED ORDER — LIDOCAINE VISCOUS HCL 2 % MT SOLN
OROMUCOSAL | Status: DC | PRN
  Administered 2019-05-15: 10 mL

## 2019-05-15 MED ORDER — LIDOCAINE VISCOUS HCL 2 % MT SOLN
OROMUCOSAL | Status: AC
  Filled 2019-05-15: qty 15

## 2019-05-15 NOTE — Progress Notes (Signed)
PT Cancellation Note  Patient Details Name: MATY MACHUCA MRN: [id number] DOB: Sep 11, 1965   Cancelled Treatment:     awaiting IR GT Tube Change due to leakage.  Will attempt to see tomorrow.   Rica Koyanagi  PTA Acute  Rehabilitation Services Pager      [phone] Office      [phone]

## 2019-05-15 NOTE — Progress Notes (Signed)
OT Cancellation Note  Patient Details Name: Angie [name] MRN: [id number] DOB: November 12, 1965   Cancelled Treatment:    Reason Eval/Treat Not Completed: Patient at procedure or test/ unavailable. Pt currently being transported out of room for procedure. Plan to reattempt, likely tomorrow.  Tyrone Schimke, OT Acute Rehabilitation Services Pager: [phone] Office: [phone]  05/15/2019, 11:25 AM

## 2019-05-15 NOTE — Procedures (Signed)
Leaky surgical Gtube  S/p Gtube manipulation and injection  Gtube retention bumper was loose at the skin site.  Bumper was advanced to a snug fit. Injection confirms patency and position with in the stomach.  No further leakage at skin  No comp Stable ebl 0

## 2019-05-15 NOTE — Progress Notes (Signed)
PROGRESS NOTE    Angie [name]  [id number] DOB: 05-09-1965 DOA: 05/12/2019 PCP: Harvie Junior, MD  Brief Narrative:  54 y.o.femalewith medical history significant ofhypertension, depression, diabetes mellitus type 2, history of gastric bypass surgery with long stented complications, migraine and failure to thrive presented to ED for evaluation of discharge from G-tube site. Patient states that the problem started about 8 days ago at the site of G-tube and continue to worsen and now she is having severe drainage around the site of G-tube whenever she has tube feeds. Patient is also complaining of soreness and mild tenderness around the area where G-tube is placed. Patient states that she is able to tolerate p.o. food and denies any fever, chills, chest pain, nausea, vomiting and urinary symptoms. Patient was recently admitted for hypoglycemia and profound electrolyte abnormalities and G-tube was placed on May 04, 2019 and patient was discharged home yesterday morning. Patient also returned to the ED yesterday evening with a concern for possible overdose and she was given Narcan and discharged to home after her condition improved.  ED Course:On arrival to ED, patient had blood pressure of 107/81, heart rate 93, temperature 97.6, respiratory rate 17 and oxygen saturation 100% on room air. Point-of-care blood glucose was 34 while BMP showed blood glucose of 82. ALT was elevated and albumin 3.1. Chest x-ray was negative for acute pulmonary problem. Patient was given multiple doses of D50 in the ED for hypoglycemia and general surgery was also contacted for leakage from G-tube. General surgery recommended to use DuoDERM and wound care consult at this time and if this does not help then they will try to upsize the G-tube to prevent further leakage.  05/14/2019-patient had episodes of hypoglycemia overnight.  Her initial CBG was 54.  After drinking orange juice it came up to 133.   Patient upset this morning that she did not get her narcotics while she was hypoglycemic.  05/15/2019 patient remains on D10 for hypoglycemia.  Last 24 hours her sugars have been 89, 82, 89, 64 on D10. IR notes noted.  Status post G-tube manipulation and injection and confirming position within the stomach with no further leakage at the skin. Assessment & Plan:   Principal Problem:   Leaking at G tube site Active Problems:   History of Roux-en-Y gastric bypass 2007   Hypoglycemia   Intractable nausea and vomiting   Abdominal pain, chronic, epigastric   Protein-calorie malnutrition, severe   Malnutrition following gastric bypass surgery   Hx of laparoscopic gastrostomy tube insertion 05/04/2019   Failure to thrive in adult   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HCC)  #1 leakage from the PEG tube site-patient had PEG placed this month due to being malnourished with severe electrolyte abnormalities and not enough p.o. intake and failure to thrive. Seen by IR today status post manipulation of the G-tube and injection with no evidence of further leakage.  Will check with IR if we can start feeding her through the PEG tube today.  This should prevent her hypoglycemia. PT consult.  #2 hypoglycemia with multiple episodes of severe hypoglycemia.  Looking through the records work-up was done in the past to rule out insulinoma. Patient has a history of type 2 diabetes not on any medications at home or here. Blood sugars low normal on D10.  #3 chronic pain issues-patient was brought to the ER a day before she was admitted for possible overdose on narcotics.  She was given Narcan and sent back home.  Currently while in  the hospital she is complaining of 10 out of 10 pain at the gastric tube site and requesting pain medications.  She is now on oxycodone she should not get a prescription at the time of discharge for this.  #4 hypertension blood pressure 99/63.  Continue to hold lisinopril and  hydrochlorothiazide.   #5 depression continue Seroquel and Zyprexa        Nutrition Problem: Increased nutrient needs Etiology: acute illness     Signs/Symptoms: estimated needs    Interventions: Boost Breeze, Prostat, Magic cup  Estimated body mass index is 22.13 kg/m as calculated from the following:   Height as of this encounter: 5' (1.524 m).   Weight as of this encounter: 51.4 kg.  DVT prophylaxis: Lovenox Code Status: Full code  family Communication: None Disposition Plan  patient remains hypoglycemic needing D10.  PEG tube was manipulated today.  Hopefully we can start feeding today to prevent further hypoglycemia.  She is not ready to be discharged today with ongoing hypoglycemia.  Consultants:   General surgery  Procedures: None  antimicrobials: Keflex   Subjective: She is awake alert sitting up in bed in no distress no complaints   Objective: Vitals:   05/14/19 1942 05/14/19 2020 05/15/19 0036 05/15/19 0425  BP:  100/68 107/82 99/73  Pulse:  81 97 80  Resp:  18 14 14   Temp:  99.3 F (37.4 C) 98.1 F (36.7 C) 98.2 F (36.8 C)  TempSrc:   Oral Oral  SpO2:  100% 100% 100%  Weight: 51.4 kg     Height: 5' (1.524 m)       Intake/Output Summary (Last 24 hours) at 05/15/2019 1255 Last data filed at 05/15/2019 1000 Gross per 24 hour  Intake 3103.59 ml  Output 1600 ml  Net 1503.59 ml   Filed Weights   05/14/19 1942  Weight: 51.4 kg    Examination:  General exam: Appears calm and comfortable  Respiratory system: Clear to auscultation. Respiratory effort normal. Cardiovascular system: S1 & S2 heard, RRR. No JVD, murmurs, rubs, gallops or clicks. No pedal edema. Gastrointestinal system: Abdomen is nondistended, soft and nontender. No organomegaly or masses felt. Normal bowel sounds heard.  PEG tube in place dressings on with no drainage noted. Central nervous system: Alert and oriented. No focal neurological deficits. Extremities: Symmetric 5 x  5 power. Skin: No rashes, lesions or ulcers Psychiatry: Judgement and insight appear normal. Mood & affect appropriate.     Data Reviewed: I have personally reviewed following labs and imaging studies  CBC: Recent Labs  Lab 05/09/19 0419 05/09/19 0419 05/10/19 0205 05/11/19 0329 05/12/19 1633 05/12/19 1708 05/13/19 0436  WBC 6.0  --  6.1 5.8 6.8  --  7.6  HGB 8.9*   < > 10.3* 10.4* 12.2 12.6 10.7*  HCT 26.6*   < > 31.0* 30.9* 38.0 37.0 33.7*  MCV 96.4  --  95.4 96.6 101.3*  --  100.6*  PLT 95*  --  108* 133* 221  --  238   < > = values in this interval not displayed.   Basic Metabolic Panel: Recent Labs  Lab 05/08/19 1527 05/08/19 1527 05/09/19 0419 05/10/19 0205 05/11/19 0329 05/12/19 1633 05/12/19 1708 05/13/19 0436  NA 138   < >  --  138 138 140 140 140  K 4.3   < >  --  4.5 4.3 4.3 4.3 4.6  CL 110   < >  --  109 108 105 105 110  CO2 22  --   --  21* 25 26  --  23  GLUCOSE 64*   < >  --  131* 64* 89 88 65*  BUN 12   < >  --  10 14 23* 22* 21*  CREATININE 0.77   < >  --  0.82 0.82 0.85 0.90 0.59  CALCIUM 7.8*  --   --  8.0* 7.9* 8.9  --  8.1*  MG  --   --  1.7 1.6* 2.0  --   --   --   PHOS  --   --  3.2 2.6 2.8  --   --   --    < > = values in this interval not displayed.   GFR: Estimated Creatinine Clearance: 59.1 mL/min (by C-G formula based on SCr of 0.59 mg/dL). Liver Function Tests: Recent Labs  Lab 05/08/19 1527 05/10/19 0205 05/12/19 1633 05/13/19 0436  AST 36 24 24 23   ALT 33 29 29 23   ALKPHOS 132* 151* 154* 135*  BILITOT 0.7 1.1 0.9 0.2*  PROT 4.6* 5.0* 7.1 5.7*  ALBUMIN 1.8* 2.0* 3.1* 2.3*   No results for input(s): LIPASE, AMYLASE in the last 168 hours. No results for input(s): AMMONIA in the last 168 hours. Coagulation Profile: No results for input(s): INR, PROTIME in the last 168 hours. Cardiac Enzymes: No results for input(s): CKTOTAL, CKMB, CKMBINDEX, TROPONINI in the last 168 hours. BNP (last 3 results) No results for input(s):  PROBNP in the last 8760 hours. HbA1C: No results for input(s): HGBA1C in the last 72 hours. CBG: Recent Labs  Lab 05/14/19 2006 05/15/19 0031 05/15/19 0411 05/15/19 0715 05/15/19 1220  GLUCAP 106* 89 82 89 64*   Lipid Profile: No results for input(s): CHOL, HDL, LDLCALC, TRIG, CHOLHDL, LDLDIRECT in the last 72 hours. Thyroid Function Tests: No results for input(s): TSH, T4TOTAL, FREET4, T3FREE, THYROIDAB in the last 72 hours. Anemia Panel: No results for input(s): VITAMINB12, FOLATE, FERRITIN, TIBC, IRON, RETICCTPCT in the last 72 hours. Sepsis Labs: Recent Labs  Lab 05/14/19 0128 05/14/19 0322  LATICACIDVEN 1.1 1.6    Recent Results (from the past 240 hour(s))  Urine culture     Status: Abnormal   Collection Time: 05/12/19  7:37 PM   Specimen: Urine, Random  Result Value Ref Range Status   Specimen Description   Final    URINE, RANDOM Performed at [facility] [address], [place], [place] [address]    Special Requests   Final    NONE Performed at [facility], [place] [address], [place], [place] [address]    Culture MULTIPLE SPECIES PRESENT, SUGGEST RECOLLECTION (A)  Final   Report Status 05/14/2019 FINAL  Final  SARS CORONAVIRUS 2 (TAT 6-24 HRS) Nasopharyngeal Nasopharyngeal Swab     Status: None   Collection Time: 05/12/19  9:31 PM   Specimen: Nasopharyngeal Swab  Result Value Ref Range Status   SARS Coronavirus 2 NEGATIVE NEGATIVE Final    Comment: (NOTE) SARS-CoV-2 target nucleic acids are NOT DETECTED. The SARS-CoV-2 RNA is generally detectable in upper and lower respiratory specimens during the acute phase of infection. Negative results do not preclude SARS-CoV-2 infection, do not rule out co-infections with other pathogens, and should not be used as the sole basis for treatment or other patient management decisions. Negative results must be combined with clinical observations, patient history, and epidemiological  information. The expected result is Negative. Fact Sheet for Patients: SugarRoll.be Fact Sheet for Healthcare Providers: https://www.woods-.com/ This test is not yet  approved or cleared by the [place] and  has been authorized for detection and/or diagnosis of SARS-CoV-2 by FDA under an Emergency Use Authorization (EUA). This EUA will remain  in effect (meaning this test can be used) for the duration of the COVID-19 declaration under Section 56 4(b)(1) of the Act, 21 U.S.C. section 360bbb-3(b)(1), unless the authorization is terminated or revoked sooner. Performed at [facility], [place] [address], [place], [place] [address]          Radiology Studies: IR Cm Inj Any Colonic Tube W/Fluoro  Result Date: 05/15/2019 INDICATION: Leaking surgical gastrostomy EXAM: EXTERNAL MANIPULATION AND FLUOROSCOPIC INJECTION MEDICATIONS: None. ANESTHESIA/SEDATION: Moderate Sedation Time:  None. The patient was continuously monitored during the procedure by the interventional radiology nurse under my direct supervision. CONTRAST:  10 cc-administered into the gastric lumen. FLUOROSCOPY TIME:  Fluoroscopy Time: 0 minutes 6 seconds (0.4 mGy). COMPLICATIONS: None immediate. PROCEDURE: Informed written consent was obtained from the patient after a thorough discussion of the procedural risks, benefits and alternatives. All questions were addressed. Maximal Sterile Barrier Technique was utilized including caps, mask, sterile gowns, sterile gloves, sterile drape, hand hygiene and skin antiseptic. A timeout was performed prior to the initiation of the procedure. The gastrostomy retention bumper was loose at the skin site. The external bumper was advanced closer to the skin for a snug fit. Contrast injection performed. Gastrostomy is patent with the balloon retention tip in the stomach. No further leakage at the skin site external to the patient. IMPRESSION:  External manipulation and injection of the gastrostomy confirms adequate position and no further leakage. Electronically Signed   By: Jerilynn Mages.  Shick M.D.   On: 05/15/2019 12:52   [place] EKG SITE RITE  Result Date: 05/14/2019 If Site Rite image not attached, placement could not be confirmed due to current cardiac rhythm.       Scheduled Meds: . cephALEXin  500 mg Oral Q12H  . Chlorhexidine Gluconate Cloth  6 each Topical Daily  . cyclobenzaprine  10 mg Oral TID  . enoxaparin (LOVENOX) injection  40 mg Subcutaneous QHS  . feeding supplement  1 Container Oral BID BM  . feeding supplement (PRO-STAT SUGAR FREE 64)  30 mL Oral BID  . lidocaine      . liver oil-zinc oxide   Topical BID  . multivitamin with minerals  1 tablet Oral Daily  . OLANZapine  2.5 mg Oral Daily  . pantoprazole  40 mg Oral QHS  . QUEtiapine  50 mg Oral QHS  . sucralfate  1 g Oral TID AC   Continuous Infusions: . dextrose 150 mL/hr at 05/15/19 1007     LOS: 2 days     Georgette Shell, MD [facility]  If 7PM-7AM, please contact night-coverage [email or web address] Password [facility] 05/15/2019, 12:55 PM

## 2019-05-15 NOTE — Progress Notes (Signed)
Brief Nutrition Note  Consult received for "Enteral/tube feeding assessment and recommendations. Dietitian may initiate Adult Tube Feeding Protocol and Unclogging Feeding Tube Protocol"  Adult Enteral Nutrition Protocol initiated with 237 ml Osmolite 1.5 TID via G-tube gravity feeds. Provides 1065 kcals and 44g protein.  Pt continues to consume meals and supplements PO.   On 1/18:  Ate 75% of lunch (~330 kcals, 13g protein) and 100% of breakfast (~420 kcals, 18g protein). Had 1 Boost and 1 Prostat PO (~350 kcals, 24g protein). Total: 1100 kcals, 55g protein  Initial assessment completed 1/18. RD to follow-up for TF tolerance 1/20. If pt able to maintain POs, may not need TF but PRN supplementally.  Labs:  Recent Labs  Lab 05/09/19 0419 05/10/19 0205 05/10/19 0205 05/11/19 0329 05/11/19 0329 05/12/19 1633 05/12/19 1708 05/13/19 0436  NA  --  138   < > 138   < > 140 140 140  K  --  4.5   < > 4.3   < > 4.3 4.3 4.6  CL  --  109   < > 108   < > 105 105 110  CO2  --  21*   < > 25  --  26  --  23  BUN  --  10   < > 14   < > 23* 22* 21*  CREATININE  --  0.82   < > 0.82   < > 0.85 0.90 0.59  CALCIUM  --  8.0*   < > 7.9*  --  8.9  --  8.1*  MG 1.7 1.6*  --  2.0  --   --   --   --   PHOS 3.2 2.6  --  2.8  --   --   --   --   GLUCOSE  --  131*   < > 64*   < > 89 88 65*   < > = values in this interval not displayed.    Angie Bibles, MS, RD, LDN Inpatient Clinical Dietitian Pager: [phone] After Hours Pager: [phone]

## 2019-05-15 NOTE — Progress Notes (Signed)
[facility] Progress Note     Subjective: CC: leakage around G-tube Patient still having some leakage around G-tube but reports it seems like it is slightly less. Denies nausea. Tolerating PO. Patient reports neuropathic pain.   Objective: Vital signs in last 24 hours: Temp:  [98.1 F (36.7 C)-99.3 F (37.4 C)] 98.2 F (36.8 C) (01/19 0425) Pulse Rate:  [80-110] 80 (01/19 0425) Resp:  [14-18] 14 (01/19 0425) BP: (99-114)/(68-83) 99/73 (01/19 0425) SpO2:  [100 %] 100 % (01/19 0425) Weight:  [51.4 kg] 51.4 kg (01/18 1942) Last BM Date: 05/14/19  Intake/Output from previous day: 01/18 0701 - 01/19 0700 In: 3363.6 [P.O.:1200; I.V.:2163.6] Out: 2200 [Urine:2200] Intake/Output this shift: No intake/output data recorded.  PE: General: alert, no distress, thin Lungs: normal effort Heart: normal rate and rhythm  Abdomen: g-tube in place, sutures not intact, small opening at inferior aspect of tube with small amount of thick, clear, drainage pooling in opening, no active drainage,Small open wound 0.5cm x 0.5 cm,No cellulitis or major skin maceration Ext: pulses 2+, trace pitting edema bilateral LEs  Lab Results:  Recent Labs    05/12/19 1633 05/12/19 1633 05/12/19 1708 05/13/19 0436  WBC 6.8  --   --  7.6  HGB 12.2   < > 12.6 10.7*  HCT 38.0   < > 37.0 33.7*  PLT 221  --   --  238   < > = values in this interval not displayed.   BMET Recent Labs    05/12/19 1633 05/12/19 1633 05/12/19 1708 05/13/19 0436  NA 140   < > 140 140  K 4.3   < > 4.3 4.6  CL 105   < > 105 110  CO2 26  --   --  23  GLUCOSE 89   < > 88 65*  BUN 23*   < > 22* 21*  CREATININE 0.85   < > 0.90 0.59  CALCIUM 8.9  --   --  8.1*   < > = values in this interval not displayed.   PT/INR No results for input(s): LABPROT, INR in the last 72 hours. CMP     Component Value Date/Time   NA 140 05/13/2019 0436   K 4.6 05/13/2019 0436   CL 110 05/13/2019 0436   CO2 23 05/13/2019 0436   GLUCOSE 65 (L) 05/13/2019 0436   BUN 21 (H) 05/13/2019 0436   CREATININE 0.59 05/13/2019 0436   CALCIUM 8.1 (L) 05/13/2019 0436   PROT 5.7 (L) 05/13/2019 0436   ALBUMIN 2.3 (L) 05/13/2019 0436   AST 23 05/13/2019 0436   ALT 23 05/13/2019 0436   ALKPHOS 135 (H) 05/13/2019 0436   BILITOT 0.2 (L) 05/13/2019 0436   GFRNONAA >60 05/13/2019 0436   GFRAA >60 05/13/2019 0436   Lipase     Component Value Date/Time   LIPASE 13 04/29/2019 1650       Studies/Results: [place] EKG SITE RITE  Result Date: 05/14/2019 If Site Rite image not attached, placement could not be confirmed due to current cardiac rhythm.   Anti-infectives: Anti-infectives (From admission, onward)   Start     Dose/Rate Route Frequency Ordered Stop   05/13/19 0415  cephALEXin (KEFLEX) capsule 500 mg     500 mg Oral Every 12 hours 05/13/19 0348         Assessment/Plan DM CKD HTN GERD Hx seizures  Chronic anemia  Hypoglycemia *above per medicine*  FTT  Protein Calorie Malnutrition  - Patient with Hx  of Roux-en-Y in 2007.The patient suffered from weight loss and malnutrition over the last several yearsafter struggling withstenosis related to ulcer at the gastrojejunostomy site requiring multiple endoscopies and dilations.She ultimately underwent alaparoscopic revision of gastrojejunostomy and insertion of gastrostomy tube to remnant stomachin 2019 by Dr. Kieth Brightly. G-tube has since been removed.  - EGD on 1/6 that showed no stricture or stenosis and a small gastric remnant.  - Upper GI1/7showed slow clearance of the esophagus with esophageal dysmotility, no stricture. This also showed small gastric pouch with immediate emptying into the small bowel.  -s/p laparoscopic gastrostomy tube insertion, Dr. Quinn Axe, 01/08 - Tube feeds - plan for IR exchange of G-tube today, hopefully this will resolve drainage around G-tube  Macrocytic anemia/folate deficiency - hgb 10.7 1/17 and VSS  FEN -tube  feeds and carb modified VTE -SCDs ID -no antibiotics currently Follow up: Dr. Kieth Brightly  Plan:IR G-tube exchange today   LOS: 2 days    Brigid Re , [facility] 05/15/2019, 9:42 AM Please see Amion for pager number during day hours 7:00am-4:30pm

## 2019-05-16 LAB — GLUCOSE, CAPILLARY
Glucose-Capillary: 131 mg/dL — ABNORMAL HIGH (ref 70–99)
Glucose-Capillary: 138 mg/dL — ABNORMAL HIGH (ref 70–99)
Glucose-Capillary: 173 mg/dL — ABNORMAL HIGH (ref 70–99)
Glucose-Capillary: 69 mg/dL — ABNORMAL LOW (ref 70–99)
Glucose-Capillary: 81 mg/dL (ref 70–99)
Glucose-Capillary: 92 mg/dL (ref 70–99)
Glucose-Capillary: 98 mg/dL (ref 70–99)

## 2019-05-16 LAB — MAGNESIUM
Magnesium: 1.3 mg/dL — ABNORMAL LOW (ref 1.7–2.4)
Magnesium: 2.4 mg/dL (ref 1.7–2.4)

## 2019-05-16 LAB — PHOSPHORUS
Phosphorus: 2.9 mg/dL (ref 2.5–4.6)
Phosphorus: 2.8 mg/dL (ref 2.5–4.6)

## 2019-05-16 MED ORDER — MAGNESIUM SULFATE 2 GM/50ML IV SOLN
2.0000 g | Freq: Once | INTRAVENOUS | Status: AC
  Administered 2019-05-16: 2 g via INTRAVENOUS
  Filled 2019-05-16: qty 50

## 2019-05-16 MED ORDER — OXYCODONE HCL 5 MG PO TABS
10.0000 mg | ORAL_TABLET | Freq: Once | ORAL | Status: AC
  Administered 2019-05-16: 10 mg via ORAL
  Filled 2019-05-16: qty 2

## 2019-05-16 MED ORDER — MAGNESIUM GLUCONATE 500 MG PO TABS
500.0000 mg | ORAL_TABLET | Freq: Every day | ORAL | Status: DC
  Administered 2019-05-16 – 2019-05-18 (×3): 500 mg via ORAL
  Filled 2019-05-16 (×3): qty 1

## 2019-05-16 MED ORDER — OXYCODONE HCL 5 MG PO TABS
5.0000 mg | ORAL_TABLET | Freq: Four times a day (QID) | ORAL | Status: DC | PRN
  Administered 2019-05-16 – 2019-05-18 (×9): 5 mg via ORAL
  Filled 2019-05-16 (×10): qty 1

## 2019-05-16 NOTE — Progress Notes (Signed)
Occupational Therapy Treatment Patient Details Name: Angie [name] MRN: [id number] DOB: 01/25/1966 Today's Date: 05/16/2019    History of present illness  Angie [name] is a 54 y.o. 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   OT comments  Pt reports she didn't sleep last night, and she was sleepy.  Used BSC only and repositioned in bed afterwards, as neck was hyperextended when OT arrived  Follow Up Recommendations  Home health OT;Supervision - Intermittent    Equipment Recommendations  3 in 1 bedside commode    Recommendations for Other Services      Precautions / Restrictions Precautions Precautions: Fall Precaution Comments: watch HR Restrictions Weight Bearing Restrictions: No       Mobility Bed Mobility Overal bed mobility: Modified Independent             General bed mobility comments: use of arms to self assist legs  Transfers       Sit to Stand: Supervision Stand pivot transfers: Supervision       General transfer comment: used bedrail and armrest of BSC    Balance                                           ADL either performed or assessed with clinical judgement   ADL                           Toilet Transfer: Min guard;Stand-pivot;BSC;RW   Toileting- Clothing Manipulation and Hygiene: Set up;Sitting/lateral lean         General ADL Comments: Pt used arms to self assist legs on/off bed.  She used toilet but didn't feel up to performing ADL at this time.  Pt sleepy. When OT arrived, her neck was hyperextended, pillow under thoracic spine.  Repositioned at end of session     Vision       Perception     Praxis      Cognition Arousal/Alertness: (sleepy) Behavior During  Therapy: WFL for tasks assessed/performed Overall Cognitive Status: Within Functional Limits for tasks assessed                                          Exercises  UEs remain weak, L 3+/5, R 3/5 (with PICC). Encouraged AROM throughout day   Shoulder Instructions       General Comments      Pertinent Vitals/ Pain       Pain Assessment: Faces Faces Pain Scale: Hurts little more Pain Location: neck Pain Descriptors / Indicators: Sore Pain Intervention(s): Limited activity within patient's tolerance;Monitored during session;Repositioned  Home Living                                          Prior Functioning/Environment              Frequency  Min 2X/week        Progress Toward Goals  OT Goals(current goals can now  be found in the care plan section)  Progress towards OT goals: Progressing toward goals     Plan      Co-evaluation                 AM-PAC OT "6 Clicks" Daily Activity     Outcome Measure   Help from another person eating meals?: A Little Help from another person taking care of personal grooming?: A Little Help from another person toileting, which includes using toliet, bedpan, or urinal?: A Little Help from another person bathing (including washing, rinsing, drying)?: A Little Help from another person to put on and taking off regular upper body clothing?: A Little Help from another person to put on and taking off regular lower body clothing?: A Little 6 Click Score: 18    End of Session    OT Visit Diagnosis: Other abnormalities of gait and mobility (R26.89);Muscle weakness (generalized) (M62.81)   Activity Tolerance Patient limited by fatigue   Patient Left in bed;with call bell/phone within reach   Nurse Communication          Time: [id number] OT Time Calculation (min): 18 min  Charges: OT General Charges $OT Visit: 1 Visit OT Treatments $Self Care/Home Management : 8-22 mins  Wess Baney  S, OTR/L Acute Rehabilitation Services 05/16/2019   [place] 05/16/2019, 11:17 AM

## 2019-05-16 NOTE — Progress Notes (Signed)
Physical Therapy Treatment Patient Details Name: Angie [name] MRN: [id number] DOB: Jan 25, 1966 Today's Date: 05/16/2019    History of Present Illness  Angie [name] is a 54 y.o. 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    PT Comments    Pt in bed.  Assisted OOB to [facility] then to amb.  General transfer comment: good use of hands to steady self.  General Gait Details: poor forward flex posture but good alternating gait and safety cognition.   Pt is highly motivated  Follow Up Recommendations  Home health PT     Equipment Recommendations  (has a walker at home)    Recommendations for Other Services       Precautions / Restrictions Precautions Precautions: Fall Precaution Comments: watch HR Restrictions Weight Bearing Restrictions: No    Mobility  Bed Mobility Overal bed mobility: Modified Independent             General bed mobility comments: increased time  Transfers Overall transfer level: Needs assistance Equipment used: Rolling walker (2 wheeled);None Transfers: Sit to/from [employer] to Stand: Supervision Stand pivot transfers: Supervision       General transfer comment: good use of hands to steady self  Ambulation/Gait Ambulation/Gait assistance: Supervision Gait Distance (Feet): 350 Feet Assistive device: Rolling walker (2 wheeled) Gait Pattern/deviations: Step-through pattern;Decreased stride length;Trunk flexed Gait velocity: decreased   General Gait Details: poor forward flex posture but good alternating gait and safety cognition   Stairs             Wheelchair Mobility    Modified Rankin (Stroke Patients Only)       Balance                                             Cognition Arousal/Alertness: Awake/alert Behavior During Therapy: WFL for tasks assessed/performed Overall Cognitive Status: Within Functional Limits for tasks assessed                                 General Comments: highly motivated      Exercises      General Comments        Pertinent Vitals/Pain Pain Assessment: Faces Faces Pain Scale: Hurts even more Pain Location: ABD near G insertion Pain Descriptors / Indicators: Tender Pain Intervention(s): Monitored during session;Premedicated before session    Home Living                      Prior Function            PT Goals (current goals can now be found in the care plan section) Progress towards PT goals: Progressing toward goals    Frequency    Min 3X/week      PT Plan Current plan remains appropriate    Co-evaluation              AM-PAC PT "6 Clicks" Mobility   Outcome Measure  Help needed turning from your back to your side while in a flat bed without using bedrails?: None Help needed moving from lying on your back to sitting  on the side of a flat bed without using bedrails?: None Help needed moving to and from a bed to a chair (including a wheelchair)?: A Little Help needed standing up from a chair using your arms (e.g., wheelchair or bedside chair)?: A Little Help needed to walk in hospital room?: A Little Help needed climbing 3-5 steps with a railing? : A Little 6 Click Score: 20    End of Session Equipment Utilized During Treatment: Gait belt Activity Tolerance: Patient tolerated treatment well Patient left: with call bell/phone within reach;with chair alarm set;in bed Nurse Communication: Mobility status;Patient requests pain meds PT Visit Diagnosis: Unsteadiness on feet (R26.81);Muscle weakness (generalized) (M62.81)     Time: [id number] PT Time Calculation (min) (ACUTE ONLY): 24 min  Charges:  $Gait Training: 8-22 mins $Therapeutic Activity: 8-22  mins                     Rica Koyanagi  PTA Acute  Rehabilitation Services Pager      [phone] Office      [phone]

## 2019-05-16 NOTE — Progress Notes (Addendum)
Patient ID: Angie [name], female   DOB: January 23, 1966, 54 y.o.   MRN: [id number]       Subjective: Leaking appears to have decreased, but g-tube has been to gravity despite being fed a regular diet and her TFs which are just coming back out of her g-tube since it is still to gravity.  ROS: See above, otherwise other systems negative  Objective: Vital signs in last 24 hours: Temp:  [98 F (36.7 C)-99.7 F (37.6 C)] 98.1 F (36.7 C) (01/20 0441) Pulse Rate:  [70-108] 108 (01/20 0441) Resp:  [15-18] 16 (01/20 0441) BP: (108-117)/(72-86) 117/86 (01/20 0441) SpO2:  [100 %] 100 % (01/20 0441) Weight:  [52.6 kg] 52.6 kg (01/20 0422) Last BM Date: 05/15/19  Intake/Output from previous day: 01/19 0701 - 01/20 0700 In: 3544.1 [P.O.:840; I.V.:2564.1; NG/GT:140] Out: 2700 [Urine:2700] Intake/Output this shift: No intake/output data recorded.  PE: Abd: soft, g-tube appears to have minimal leakage.  g-tube to gravity with TFs coming out of tube into gravity bag.  Barrier cream in place.  Small excoriated areas noted and stable.  Lab Results:  No results for input(s): WBC, HGB, HCT, PLT in the last 72 hours. BMET No results for input(s): NA, K, CL, CO2, GLUCOSE, BUN, CREATININE, CALCIUM in the last 72 hours. PT/INR No results for input(s): LABPROT, INR in the last 72 hours. CMP     Component Value Date/Time   NA 140 05/13/2019 0436   K 4.6 05/13/2019 0436   CL 110 05/13/2019 0436   CO2 23 05/13/2019 0436   GLUCOSE 65 (L) 05/13/2019 0436   BUN 21 (H) 05/13/2019 0436   CREATININE 0.59 05/13/2019 0436   CALCIUM 8.1 (L) 05/13/2019 0436   PROT 5.7 (L) 05/13/2019 0436   ALBUMIN 2.3 (L) 05/13/2019 0436   AST 23 05/13/2019 0436   ALT 23 05/13/2019 0436   ALKPHOS 135 (H) 05/13/2019 0436   BILITOT 0.2 (L) 05/13/2019 0436   GFRNONAA >60 05/13/2019 0436   GFRAA >60 05/13/2019 0436   Lipase     Component Value Date/Time   LIPASE 13 04/29/2019 1650       Studies/Results: IR Cm  Inj Any Colonic Tube W/Fluoro  Result Date: 05/15/2019 INDICATION: Leaking surgical gastrostomy EXAM: EXTERNAL MANIPULATION AND FLUOROSCOPIC INJECTION MEDICATIONS: None. ANESTHESIA/SEDATION: Moderate Sedation Time:  None. The patient was continuously monitored during the procedure by the interventional radiology nurse under my direct supervision. CONTRAST:  10 cc-administered into the gastric lumen. FLUOROSCOPY TIME:  Fluoroscopy Time: 0 minutes 6 seconds (0.4 mGy). COMPLICATIONS: None immediate. PROCEDURE: Informed written consent was obtained from the patient after a thorough discussion of the procedural risks, benefits and alternatives. All questions were addressed. Maximal Sterile Barrier Technique was utilized including caps, mask, sterile gowns, sterile gloves, sterile drape, hand hygiene and skin antiseptic. A timeout was performed prior to the initiation of the procedure. The gastrostomy retention bumper was loose at the skin site. The external bumper was advanced closer to the skin for a snug fit. Contrast injection performed. Gastrostomy is patent with the balloon retention tip in the stomach. No further leakage at the skin site external to the patient. IMPRESSION: External manipulation and injection of the gastrostomy confirms adequate position and no further leakage. Electronically Signed   By: Jerilynn Mages.  Shick M.D.   On: 05/15/2019 12:52   [place] EKG SITE RITE  Result Date: 05/14/2019 If Site Rite image not attached, placement could not be confirmed due to current cardiac rhythm.   Anti-infectives: Anti-infectives (From  admission, onward)   Start     Dose/Rate Route Frequency Ordered Stop   05/13/19 0415  cephALEXin (KEFLEX) capsule 500 mg     500 mg Oral Every 12 hours 05/13/19 0348         Assessment/Plan DM CKD HTN GERD Hx seizures  Chronic anemia  Hypoglycemia *above per medicine*  FTT  Protein Calorie Malnutrition  - Patient with Hx of Roux-en-Y in 2007.The patient suffered  from weight loss and malnutrition over the last several yearsafter struggling withstenosis related to ulcer at the gastrojejunostomy site requiring multiple endoscopies and dilations.She ultimately underwent alaparoscopic revision of gastrojejunostomy and insertion of gastrostomy tube to remnant stomachin 2019 by Dr. Kieth Brightly. G-tube has since been removed.  - EGD on 1/6 that showed no stricture or stenosis and a small gastric remnant.  - Upper GI1/7showed slow clearance of the esophagus with esophageal dysmotility, no stricture. This also showed small gastric pouch with immediate emptying into the small bowel.  -s/p laparoscopic gastrostomy tube insertion, Dr. Quinn Axe, 01/08 - Tube feeds -IR did not upsize tube yesterday.  Will clamp tube today and let her eat and get her TFs via her g-tube with it clamped and see how her leakage does.  Macrocytic anemia/folate deficiency - hgb 10.7 1/17 and VSS  FEN -tube feeds and carb modified VTE -SCDs ID -no antibiotics currently Follow up: Dr. Kieth Brightly  Plan:no further surgical needs at this point.  Patient surgically stable for Dc home when medically stable.  We will sign off   LOS: 3 days    Henreitta Cea , [facility] 05/16/2019, 9:35 AM Please see Amion for pager number during day hours 7:00am-4:30pm or 7:00am -11:30am on weekends

## 2019-05-16 NOTE — Progress Notes (Signed)
Nutrition Follow-up  INTERVENTION:   -Continue 237 ml Osmolite 1.5 TID via G-tube gravity feeds. Provides 1065 kcals, 44g protein, and 543 ml H2O. -Free water flush with 20 ml before and after each feed  -Continue Boost Breeze po TID, each supplement provides 250 kcal and 9 grams of protein -Continue Prostat liquid protein PO 30 ml BID with meals, each supplement provides 100 kcal, 15 grams protein. -Magic cup BID with meals, each supplement provides 290 kcal and 9 grams of protein -Multivitamin with minerals daily  NUTRITION DIAGNOSIS:   Increased nutrient needs related to acute illness as evidenced by estimated needs.  Ongoing.  GOAL:   Patient will meet greater than or equal to 90% of their needs  Progressing.  MONITOR:   PO intake, Supplement acceptance, Labs, Weight trends, TF tolerance  REASON FOR ASSESSMENT:   Consult Enteral/tube feeding initiation and management  ASSESSMENT:   54 y.o. female with medical history significant of HTN, depression, type 2 DM, history of gastric bypass surgery, PEG placement in remnant stomach 05/04/19 due to inadequate PO intakes, migraines, and FTT. She presented to the ED on 1/16 due to drainage/discharge from PEG site which are severe when she has tube feeds. She had also presented to the ED on 1/15 due to concern for possible OD; given Narcan and discharged home after her condition improved.  **RD working remotely**  Pt's TF has been coming back out of the G-tube. If Gtube unable to be used for feedings, surgery recommends switch tube to gastrojejunostomy tube or to initiate TPN. Will monitor for plans going forward. For now surgery recommends clamping G-tube after feeds.   Pt is continuing to eat PO, consumed 60% of breakfast yesterday (~235 kcals and 13g protein) and had a sandwich with a cheese stick for dinner (~350 kcals and 14g protein). Pt consumed 2 Boost Breeze and 30 ml Prostat BID (~700 kcals and 48g protein).   Admission  weight: 113 lbs. Current weight: 115 lbs.  Medications: Mg gluconate tablet, Multivitamin with minerals daily, Carafate, IV Mg sulfate  Labs reviewed: CBGs: 69-138 Low Mg Phos WNL  Diet Order:   Diet Order            Diet heart healthy/carb modified Room service appropriate? Yes; Fluid consistency: Thin  Diet effective now              EDUCATION NEEDS:   No education needs have been identified at this time  Skin:  Skin Assessment: Reviewed RN Assessment(incision site to abdomen from G-tube placement 1/8)  Last BM:  1/19 -type 4  Height:   Ht Readings from Last 1 Encounters:  05/14/19 5' (1.524 m)    Weight:   Wt Readings from Last 1 Encounters:  05/16/19 52.6 kg    Ideal Body Weight:  45.4 kg  BMI:  Body mass index is 22.65 kg/m.  Estimated Nutritional Needs:   Kcal:  1620-1815 kcal  Protein:  80-90 grams  Fluid:  >/= 1.8 L/day  Clayton Bibles, MS, RD, LDN Inpatient Clinical Dietitian Pager: [phone] After Hours Pager: [phone]

## 2019-05-16 NOTE — Progress Notes (Signed)
PROGRESS NOTE    Angie [name]  [id number] DOB: [birth year] DOA: 05/12/2019 PCP: Harvie Junior, MD    Brief Narrative: 54 y.o.femalewith medical history significant ofhypertension, depression, diabetes mellitus type 2, history of gastric bypass surgery with long stented complications, migraine and failure to thrive presented to ED for evaluation of discharge from G-tube site. Patient states that the problem started about 8 days ago at the site of G-tube and continue to worsen and now she is having severe drainage around the site of G-tube whenever she has tube feeds. Patient is also complaining of soreness and mild tenderness around the area where G-tube is placed. Patient states that she is able to tolerate p.o. food and denies any fever, chills, chest pain, nausea, vomiting and urinary symptoms. Patient was recently admitted for hypoglycemia and profound electrolyte abnormalities and G-tube was placed on May 04, 2019 and patient was discharged home yesterday morning. Patient also returned to the ED yesterday evening with a concern for possible overdose and she was given Narcan and discharged to home after her condition improved.  ED Course:On arrival to ED, patient had blood pressure of 107/81, heart rate 93, temperature 97.6, respiratory rate 17 and oxygen saturation 100% on room air. Point-of-care blood glucose was 34 while BMP showed blood glucose of 82. ALT was elevated and albumin 3.1. Chest x-ray was negative for acute pulmonary problem. Patient was given multiple doses of D50 in the ED for hypoglycemia and general surgery was also contacted for leakage from G-tube. General surgery recommended to use DuoDERM and wound care consult at this time and if this does not help then they will try to upsize the G-tube to prevent further leakage.  Hospital course patient had episodes of hypoglycemia since hospital admission.  She was kept on D10 throughout.  Her PEG tube was  upsized 05/15/2019 by IR.  She is restarted on tube feeds and encouraging her to eat as much as she can eat.  She was still hypoglycemic at 68 this morning.  Assessment & Plan:   Principal Problem:   Leaking at G tube site Active Problems:   History of Roux-en-Y gastric bypass 2007   Hypoglycemia   Intractable nausea and vomiting   Abdominal pain, chronic, epigastric   Protein-calorie malnutrition, severe   Malnutrition following gastric bypass surgery   Hx of laparoscopic gastrostomy tube insertion 05/04/2019   Failure to thrive in adult   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HCC)   #1 leakage from the PEG tube site-patient had PEG placed this month due to being malnourished with severe electrolyte abnormalities and not enough p.o. intake and failure to thrive.  Patient had her PEG tube upsized by IR 05/15/2019. Dietary was consulted and recommends- Continue 237 ml Osmolite 1.5 TID via G-tube gravity feeds. Provides 1065 kcals, 44g protein, and 543 ml H2O. -Free water flush with 20 ml before and after each feed  -Continue Boost Breeze po TID, each supplement provides 250 kcal and 9 grams of protein -Continue Prostat liquid protein PO 30 ml BID with meals, each supplement provides 100 kcal, 15 grams protein. -Magic cup BID with meals, each supplement provides 290 kcal and 9 grams of protein -Multivitamin with minerals daily  #2 hypoglycemia with multiple episodes of severe hypoglycemia. Looking through the records work-up was done in the past to rule out insulinoma. Patient has a history of type 2 diabetes not on any medications at home or here. Blood sugars last 24 hours 92, 98, 69, 138. I am expecting  her blood sugars to improve now that we started her back on tube feeds and p.o. intake.  If her blood sugar remains normal in the next 24 hours she can be discharged home.  #3 chronic pain issues-patient was brought to the ER a day before she was admitted for possible overdose on narcotics.  She was given Narcan and sent back home. Currently while in the hospital she is complaining of 10 out of 10 pain at the gastric tube site and requesting pain medications. She is now on oxycodone she should not get a prescription at the time of discharge for this.  #4 hypertension blood pressure improved to 117/86 from 99/63.  Lisinopril and hydrochlorothiazide are on hold continue to hold.   #5 depression continue Seroquel and Zyprexa    Nutrition Problem: Increased nutrient needs Etiology: acute illness     Signs/Symptoms: estimated needs    Interventions: Boost Breeze, Prostat, Magic cup, Tube feeding  Estimated body mass index is 22.65 kg/m as calculated from the following:   Height as of this encounter: 5' (1.524 m).   Weight as of this encounter: 52.6 kg.  DVT prophylaxis:Lovenox Code Status:Full code  family Communication:None Disposition Plan patient remains hypoglycemic needing D10.  PEG tube was manipulated today.  Hopefully we can start feeding today to prevent further hypoglycemia.  She is not ready to be discharged today with ongoing hypoglycemia.  Consultants:  General surgery  Procedures:None  antimicrobials:Keflex   Subjective: She is awake alert sitting up in bed anxious about hypoglycemia remains on D10.  Objective: Vitals:   05/15/19 1954 05/16/19 0008 05/16/19 0422 05/16/19 0441  BP: 108/78 109/72  117/86  Pulse: 70 100  (!) 108  Resp: 16 18  16   Temp: 99.7 F (37.6 C) 98.1 F (36.7 C)  98.1 F (36.7 C)  TempSrc: Oral Oral  Oral  SpO2: 100% 100%  100%  Weight:   52.6 kg   Height:        Intake/Output Summary (Last 24 hours) at 05/16/2019 1255 Last data filed at 05/16/2019 1100 Gross per 24 hour  Intake 3544.1 ml  Output 3000 ml  Net 544.1 ml   Filed Weights   05/14/19 1942 05/16/19 0422  Weight: 51.4 kg 52.6 kg    Examination:  General exam: Appears calm and comfortable  Respiratory system: Clear to auscultation.  Respiratory effort normal. Cardiovascular system: S1 & S2 heard, RRR. No JVD, murmurs, rubs, gallops or clicks. No pedal edema. Gastrointestinal system: Abdomen is nondistended, soft and nontender. No organomegaly or masses felt. Normal bowel sounds heard.  PEG tube in place Central nervous system: Alert and oriented. No focal neurological deficits. Extremities: Symmetric 5 x 5 power. Skin: No rashes, lesions or ulcers Psychiatry: Judgement and insight appear normal. Mood & affect appropriate.     Data Reviewed: I have personally reviewed following labs and imaging studies  CBC: Recent Labs  Lab 05/10/19 0205 05/11/19 0329 05/12/19 1633 05/12/19 1708 05/13/19 0436  WBC 6.1 5.8 6.8  --  7.6  HGB 10.3* 10.4* 12.2 12.6 10.7*  HCT 31.0* 30.9* 38.0 37.0 33.7*  MCV 95.4 96.6 101.3*  --  100.6*  PLT 108* 133* 221  --  [id number]   Basic Metabolic Panel: Recent Labs  Lab 05/10/19 0205 05/11/19 0329 05/12/19 1633 05/12/19 1708 05/13/19 0436 05/15/19 1630 05/16/19 0352  NA 138 138 140 140 140  --   --   K 4.5 4.3 4.3 4.3 4.6  --   --  CL 109 108 105 105 110  --   --   CO2 21* 25 26  --  23  --   --   GLUCOSE 131* 64* 89 88 65*  --   --   BUN 10 14 23* 22* 21*  --   --   CREATININE 0.82 0.82 0.85 0.90 0.59  --   --   CALCIUM 8.0* 7.9* 8.9  --  8.1*  --   --   MG 1.6* 2.0  --   --   --  1.4* 1.3*  PHOS 2.6 2.8  --   --   --  3.1 2.9   GFR: Estimated Creatinine Clearance: 59.1 mL/min (by C-G formula based on SCr of 0.59 mg/dL). Liver Function Tests: Recent Labs  Lab 05/10/19 0205 05/12/19 1633 05/13/19 0436  AST 24 24 23   ALT 29 29 23   ALKPHOS 151* 154* 135*  BILITOT 1.1 0.9 0.2*  PROT 5.0* 7.1 5.7*  ALBUMIN 2.0* 3.1* 2.3*   No results for input(s): LIPASE, AMYLASE in the last 168 hours. No results for input(s): AMMONIA in the last 168 hours. Coagulation Profile: No results for input(s): INR, PROTIME in the last 168 hours. Cardiac Enzymes: No results for input(s):  CKTOTAL, CKMB, CKMBINDEX, TROPONINI in the last 168 hours. BNP (last 3 results) No results for input(s): PROBNP in the last 8760 hours. HbA1C: No results for input(s): HGBA1C in the last 72 hours. CBG: Recent Labs  Lab 05/15/19 1950 05/16/19 0014 05/16/19 0438 05/16/19 0740 05/16/19 1127  GLUCAP 105* 92 98 69* 138*   Lipid Profile: No results for input(s): CHOL, HDL, LDLCALC, TRIG, CHOLHDL, LDLDIRECT in the last 72 hours. Thyroid Function Tests: No results for input(s): TSH, T4TOTAL, FREET4, T3FREE, THYROIDAB in the last 72 hours. Anemia Panel: No results for input(s): VITAMINB12, FOLATE, FERRITIN, TIBC, IRON, RETICCTPCT in the last 72 hours. Sepsis Labs: Recent Labs  Lab 05/14/19 0128 05/14/19 0322  LATICACIDVEN 1.1 1.6    Recent Results (from the past 240 hour(s))  Urine culture     Status: Abnormal   Collection Time: 05/12/19  7:37 PM   Specimen: Urine, Random  Result Value Ref Range Status   Specimen Description   Final    URINE, RANDOM Performed at [facility] [address] [place], [place] [address]    Special Requests   Final    NONE Performed at [facility], [place] [address] [place], [place] [address]    Culture MULTIPLE SPECIES PRESENT, SUGGEST RECOLLECTION (A)  Final   Report Status 05/14/2019 FINAL  Final  SARS CORONAVIRUS 2 (TAT 6-24 HRS) Nasopharyngeal Nasopharyngeal Swab     Status: None   Collection Time: 05/12/19  9:31 PM   Specimen: Nasopharyngeal Swab  Result Value Ref Range Status   SARS Coronavirus 2 NEGATIVE NEGATIVE Final    Comment: (NOTE) SARS-CoV-2 target nucleic acids are NOT DETECTED. The SARS-CoV-2 RNA is generally detectable in upper and lower respiratory specimens during the acute phase of infection. Negative results do not preclude SARS-CoV-2 infection, do not rule out co-infections with other pathogens, and should not be used as the sole basis for treatment or other patient management  decisions. Negative results must be combined with clinical observations, patient history, and epidemiological information. The expected result is Negative. Fact Sheet for Patients: SugarRoll.be Fact Sheet for Healthcare Providers: https://www.woods-.com/ This test is not yet approved or cleared by the [place] FDA and  has been authorized for detection and/or  diagnosis of SARS-CoV-2 by FDA under an Emergency Use Authorization (EUA). This EUA will remain  in effect (meaning this test can be used) for the duration of the COVID-19 declaration under Section 56 4(b)(1) of the Act, 21 U.S.C. section 360bbb-3(b)(1), unless the authorization is terminated or revoked sooner. Performed at [facility], [place] [address], [place], [place] [address]          Radiology Studies: IR Cm Inj Any Colonic Tube W/Fluoro  Result Date: 05/15/2019 INDICATION: Leaking surgical gastrostomy EXAM: EXTERNAL MANIPULATION AND FLUOROSCOPIC INJECTION MEDICATIONS: None. ANESTHESIA/SEDATION: Moderate Sedation Time:  None. The patient was continuously monitored during the procedure by the interventional radiology nurse under my direct supervision. CONTRAST:  10 cc-administered into the gastric lumen. FLUOROSCOPY TIME:  Fluoroscopy Time: 0 minutes 6 seconds (0.4 mGy). COMPLICATIONS: None immediate. PROCEDURE: Informed written consent was obtained from the patient after a thorough discussion of the procedural risks, benefits and alternatives. All questions were addressed. Maximal Sterile Barrier Technique was utilized including caps, mask, sterile gowns, sterile gloves, sterile drape, hand hygiene and skin antiseptic. A timeout was performed prior to the initiation of the procedure. The gastrostomy retention bumper was loose at the skin site. The external bumper was advanced closer to the skin for a snug fit. Contrast injection performed. Gastrostomy is patent with the  balloon retention tip in the stomach. No further leakage at the skin site external to the patient. IMPRESSION: External manipulation and injection of the gastrostomy confirms adequate position and no further leakage. Electronically Signed   By: Jerilynn Mages.  Shick M.D.   On: 05/15/2019 12:52        Scheduled Meds: . cephALEXin  500 mg Oral Q12H  . Chlorhexidine Gluconate Cloth  6 each Topical Daily  . cyclobenzaprine  10 mg Oral TID  . enoxaparin (LOVENOX) injection  40 mg Subcutaneous QHS  . feeding supplement  1 Container Oral BID BM  . feeding supplement (OSMOLITE 1.5 CAL)  237 mL Per Tube TID PC  . feeding supplement (PRO-STAT SUGAR FREE 64)  30 mL Oral BID  . liver oil-zinc oxide   Topical BID  . magnesium gluconate  500 mg Oral Daily  . multivitamin with minerals  1 tablet Oral Daily  . OLANZapine  2.5 mg Oral Daily  . pantoprazole  40 mg Oral QHS  . QUEtiapine  50 mg Oral QHS  . sucralfate  1 g Oral TID AC   Continuous Infusions: . dextrose 150 mL/hr at 05/15/19 1705     LOS: 3 days     Georgette Shell, MD [facility]  If 7PM-7AM, please contact night-coverage [email or web address] Password [facility] 05/16/2019, 12:55 PM

## 2019-05-17 LAB — GLUCOSE, CAPILLARY
Glucose-Capillary: 106 mg/dL — ABNORMAL HIGH (ref 70–99)
Glucose-Capillary: 116 mg/dL — ABNORMAL HIGH (ref 70–99)
Glucose-Capillary: 117 mg/dL — ABNORMAL HIGH (ref 70–99)
Glucose-Capillary: 121 mg/dL — ABNORMAL HIGH (ref 70–99)
Glucose-Capillary: 146 mg/dL — ABNORMAL HIGH (ref 70–99)
Glucose-Capillary: 86 mg/dL (ref 70–99)
Glucose-Capillary: 94 mg/dL (ref 70–99)

## 2019-05-17 LAB — MAGNESIUM: Magnesium: 2.1 mg/dL (ref 1.7–2.4)

## 2019-05-17 LAB — PHOSPHORUS: Phosphorus: 2.8 mg/dL (ref 2.5–4.6)

## 2019-05-17 MED ORDER — CYCLOBENZAPRINE HCL 10 MG PO TABS
10.0000 mg | ORAL_TABLET | Freq: Three times a day (TID) | ORAL | Status: DC | PRN
  Administered 2019-05-17: 10 mg via ORAL
  Filled 2019-05-17: qty 1

## 2019-05-17 MED ORDER — QUETIAPINE FUMARATE 50 MG PO TABS
50.0000 mg | ORAL_TABLET | Freq: Two times a day (BID) | ORAL | Status: DC | PRN
  Administered 2019-05-17: 50 mg via ORAL
  Filled 2019-05-17: qty 1

## 2019-05-17 NOTE — Progress Notes (Addendum)
   05/17/19 1740  Gastrostomy/Enterostomy Gastrostomy 18 Fr. LUQ  Placement Date/Time: 05/04/19 1307   Person Inserting Catheter: Dr. Kieth Brightly  Type: Gastrostomy  Tube Size (Fr.): 18 Fr.  Location: LUQ  Tube Status Clamped  Dressing Status New drainage  Dressing Intervention Dressing changed  Dressing Type Split gauze   RN administered Osmolite via Gtube and observed that pts dressing surrounding site was saturated and site was leaking. Will update team and continue to monitor.

## 2019-05-17 NOTE — Progress Notes (Signed)
Called back about this patient with persistent leakage around her g-tube despite phalange being cinched to the skin level.  Unfortunately, this tube is likely to continue leaking regardless of whether the tube is upsized or not.  This can be a complicating feature of gastrostomy tubes.  It is possibly this is related to gastric dysmotility and nutrition leaks out as opposed to being pushed forward.  upsizing comes with its own consequences which is making the tract even larger which puts her at risk for a larger hole that will just keep leaking.  For now, we recommend barrier creams to help minimize excoriation to the skin and frequent dressing changes to minimize wetness to the skin.  At some point if this persists and she is unable to eat, she may require TNA for nutrition or returning to IR for a g-j tube where the g can be kept to gravity and the j can feed her.  She is surgically stable at this time for DC home.  She has follow up with Dr. Kieth Brightly where he can further address this issues as he deems appropriate.    Angie [name] 4:38 PM 05/17/2019

## 2019-05-17 NOTE — Progress Notes (Addendum)
   05/17/19 1115  Gastrostomy/Enterostomy Gastrostomy 18 Fr. LUQ  Placement Date/Time: 05/04/19 1307   Person Inserting Catheter: Dr. Kieth Brightly  Type: Gastrostomy  Tube Size (Fr.): 18 Fr.  Location: LUQ  Dressing Status New drainage    Pt had increased drainage from gastrostomy tube site following her afternoon tube feed. Dressing that IR placed around 11am today became saturated. IR PA paged and reported to bedside to assess.Will continue to monitor.

## 2019-05-17 NOTE — Progress Notes (Addendum)
IR.  Patient with history of malnutrition s/p gastrostomy tube placement in OR 05/04/2019 by Dr. Kieth Brightly; found to have leakage from site s/p external manipulation/injection in IR confirming adequate position and no further leakage 05/15/2019 by Dr. Annamaria Boots. IR requested by Dr. Rodena Piety for management of drainage around gastrostomy tube site.  PA at bedside to assess gastrostomy tube. Patient awake and alert sitting in bed. Complains of generalized pain- states she has fibromyalgia and arthritis and this is normal for her. Gastrostomy tube site with bumper not cinched to skin (approximately 2 cm away from skin). In addition, there were multiple layers of gauze under bumper (between skin and bumper). Dressings were removed, bumper was cinched to skin, and dressing was applied (with gauze on top of bumper). With these changes, no drainage from site occurred.  No indication for gastrostomy tube exchange at this time (it is too early- must wait a minimum of 6-8 weeks prior to exchange to ensure track has fully formed/healed). Recommend keeping bumper cinched to skin, and DO NOT place gauze between bumper and skin, as this increases likelihood of leakage from site (tube is to be dressed with gauze on top of bumper to prevent leakage). Jodi Mourning, RN aware. Will delete order. Will make Dr. Rodena Piety aware.  IR available in future if needed.   ADDENDUM: Received call from [place], [place] stating that gastrostomy tube was continuing to leak at skin site. Went to assess bedside. Bumper cinched close to skin and dressed properly with continued leakage. Discussed with Dr. Anselm Pancoast who recommends consulting [facility] for further management, as tube was placed by Dr. Kieth Brightly. Dr. Rodena Piety made aware.   Bea Graff Tomie Elko, PA-C 05/17/2019, 11:02 AM

## 2019-05-17 NOTE — Progress Notes (Signed)
Physical Therapy Treatment Patient Details Name: Angie [name] MRN: [id number] DOB: 02-15-1966 Today's Date: 05/17/2019    History of Present Illness  Angie [name] is a 54 y.o. 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    PT Comments    Pt seated EOB Indep eating lunch on arrival.   Stated, "I'm not going home today".  "My tube is leaking again".  Assisted to [facility] then a great distance in hallway using RW.    Follow Up Recommendations  Home health PT     Equipment Recommendations  None recommended by PT    Recommendations for Other Services       Precautions / Restrictions Precautions Precautions: Fall Restrictions Weight Bearing Restrictions: No    Mobility  Bed Mobility Overal bed mobility: Modified Independent             General bed mobility comments: pt sitting EOB on arrival eating lunch  Transfers Overall transfer level: Needs assistance Equipment used: Rolling walker (2 wheeled) Transfers: Sit to/from [facility] Sit to Stand: Supervision Stand pivot transfers: Supervision       General transfer comment: good use of hands to steady self  Ambulation/Gait Ambulation/Gait assistance: Supervision Gait Distance (Feet): 350 Feet Assistive device: Rolling walker (2 wheeled) Gait Pattern/deviations: Step-through pattern;Decreased stride length;Trunk flexed Gait velocity: decreased   General Gait Details: poor forward flex posture but good alternating gait and safety cognition   Stairs             Wheelchair Mobility    Modified Rankin (Stroke Patients Only)       Balance                                            Cognition Arousal/Alertness: Awake/alert Behavior  During Therapy: WFL for tasks assessed/performed Overall Cognitive Status: Within Functional Limits for tasks assessed                                 General Comments: highly motivated      Exercises      General Comments        Pertinent Vitals/Pain Pain Assessment: 0-10 Pain Score: 8  Pain Location: ABD near G insertion and Lback side Pain Descriptors / Indicators: Tender;Constant Pain Intervention(s): Monitored during session;Repositioned    Home Living                      Prior Function            PT Goals (current goals can now be found in the care plan section) Progress towards PT goals: Progressing toward goals    Frequency    Min 3X/week      PT Plan Current plan remains appropriate    Co-evaluation              AM-PAC PT "6 Clicks" Mobility   Outcome Measure  Help needed turning from your back to your side while in a flat bed without using bedrails?: None Help needed moving from lying on your back to sitting on the side of a flat bed without  using bedrails?: None Help needed moving to and from a bed to a chair (including a wheelchair)?: A Little Help needed standing up from a chair using your arms (e.g., wheelchair or bedside chair)?: A Little Help needed to walk in hospital room?: A Little Help needed climbing 3-5 steps with a railing? : A Little 6 Click Score: 20    End of Session Equipment Utilized During Treatment: Gait belt Activity Tolerance: Patient tolerated treatment well Patient left: with call bell/phone within reach;with chair alarm set;in bed Nurse Communication: Mobility status;Patient requests pain meds PT Visit Diagnosis: Unsteadiness on feet (R26.81);Muscle weakness (generalized) (M62.81)     Time: 1335-1400 PT Time Calculation (min) (ACUTE ONLY): 25 min  Charges:  $Gait Training: 8-22 mins $Therapeutic Activity: 8-22 mins                     Rica Koyanagi  PTA Acute  Rehabilitation  Services Pager      [phone] Office      [phone]

## 2019-05-17 NOTE — Progress Notes (Signed)
PROGRESS NOTE    Angie [name]  [id number] DOB: 1966/01/08 DOA: 05/12/2019 PCP: Harvie Junior, MD    Brief Narrative: 54 y.o.femalewith medical history significant ofhypertension, depression, diabetes mellitus type 2, history of gastric bypass surgery with long stented complications, migraine and failure to thrive presented to ED for evaluation of discharge from G-tube site. Patient states that the problem started about 8 days ago at the site of G-tube and continue to worsen and now she is having severe drainage around the site of G-tube whenever she has tube feeds. Patient is also complaining of soreness and mild tenderness around the area where G-tube is placed. Patient states that she is able to tolerate p.o. food and denies any fever, chills, chest pain, nausea, vomiting and urinary symptoms. Patient was recently admitted for hypoglycemia and profound electrolyte abnormalities and G-tube was placed on May 04, 2019 and patient was discharged home yesterday morning. Patient also returned to the ED yesterday evening with a concern for possible overdose and she was given Narcan and discharged to home after her condition improved.  ED Course:On arrival to ED, patient had blood pressure of 107/81, heart rate 93, temperature 97.6, respiratory rate 17 and oxygen saturation 100% on room air. Point-of-care blood glucose was 34 while BMP showed blood glucose of 82. ALT was elevated and albumin 3.1. Chest x-ray was negative for acute pulmonary problem. Patient was given multiple doses of D50 in the ED for hypoglycemia and general surgery was also contacted for leakage from G-tube. General surgery recommended to use DuoDERM and wound care consult at this time and if this does not help then they will try to upsize the G-tube to prevent further leakage.  Hospital course patient had episodes of hypoglycemia since hospital admission.  She was kept on D10 throughout.  Her PEG tube was  upsized 05/15/2019 by IR.  She is restarted on tube feeds and encouraging her to eat as much as she can eat.  She was still hypoglycemic at 68 this morning.   Assessment & Plan:   Principal Problem:   Leaking at G tube site Active Problems:   History of Roux-en-Y gastric bypass 2007   Hypoglycemia   Intractable nausea and vomiting   Abdominal pain, chronic, epigastric   Protein-calorie malnutrition, severe   Malnutrition following gastric bypass surgery   Hx of laparoscopic gastrostomy tube insertion 05/04/2019   Failure to thrive in adult   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HCC)  #1 leakage from the PEG tube site-patient had PEG placed this month due to being malnourished with severe electrolyte abnormalities and not enough p.o. intake and failure to thrive.s/p G tube manipulation and injection.G tube retention bumper was loose at the skin site.bumper was advanced to a snug fit.patient continues to have drainage around peg tube . Will consult IR again likely will need to upsize the tube. Dietary was consulted and recommends- Continue237 ml Osmolite 1.5 TID via G-tube gravity feeds. Provides 1065 kcals,44g protein, and 543 ml H2O. -Free water flush with 20 ml before and after each feed  -ContinueBoost Breeze po TID, each supplement provides 250 kcal and 9 grams of protein -ContinueProstat liquid protein PO 30 ml BID with meals, each supplement provides 100 kcal, 15 grams protein. -Magic cupBID with meals, each supplement provides 290 kcal and 9 grams of protein -Multivitamin with minerals daily  #2 hypoglycemia-patient remains on D10 at 75 cc an hour tube feeds started yesterday.  Will decrease D10 to 50 cc an hour and change her  diet to regular diet and to continue tube feeds.  Hopefully hypoglycemia will be resolved with this. Looking through the records work-up was done in the past to rule out insulinoma. Patient has a history of type 2 diabetes not on any medications at home or  here. Blood sugars last 24 hours 81, 117, 121, 116 on D10 tube feeds and p.o. intake.   Patient will need a glucometer lancets and strips upon discharge.  #3 chronic pain issues-patient was brought to the ER a day before she was admitted for possible overdose on narcotics. She was given Narcan and sent back home. Currently while in the hospital she is complaining of 10 out of 10 pain at the gastric tube site and requesting pain medications. She is now on oxycodone she should not get a prescription at the time of discharge for this. She is on Xanax, Flexeril, Zyprexa, Seroquel, oxycodone.  I have changed them to as needed. I have discussed with her the dangers, combination of medications she is taking along with hypoglycemia and that it can kill her.  She reported that she knows that but she needs the medications to keep her pain-free.  #4 hypertension  blood pressure 104/69 continue to hold lisinopril and hydrochlorothiazide.   #5 depression continue Seroquel and Zyprexa  #6 hypomagnesemia resolved.    Nutrition Problem: Increased nutrient needs Etiology: acute illness     Signs/Symptoms: estimated needs    Interventions: Boost Breeze, Prostat, Magic cup, Tube feeding  Estimated body mass index is 22.65 kg/m as calculated from the following:   Height as of this encounter: 5' (1.524 m).   Weight as of this encounter: 52.6 kg.  DVT prophylaxis:Lovenox Code Status:Full code  family Communication:None Disposition Planpatient remains hypoglycemic needing D10. PEG tube was manipulated today. Hopefully we can start feeding today to prevent further hypoglycemia. She is not ready to be discharged today with ongoing hypoglycemia. Consultants:  General surgery  Procedures:None  antimicrobials:Keflex  Subjective:  She is resting in bed asking for more oxycodone Objective: Vitals:   05/16/19 1410 05/16/19 2135 05/16/19 2144 05/17/19 0534  BP: 108/75 107/69   104/69  Pulse: (!) 110 (!) 120 88 (!) 106  Resp: 16 18  16   Temp: 98.6 F (37 C) 99.2 F (37.3 C)  98.6 F (37 C)  TempSrc: Oral Oral  Oral  SpO2: 100% 100%  100%  Weight:    52.6 kg  Height:        Intake/Output Summary (Last 24 hours) at 05/17/2019 0809 Last data filed at 05/17/2019 0600 Gross per 24 hour  Intake 2700 ml  Output 4200 ml  Net -1500 ml   Filed Weights   05/14/19 1942 05/16/19 0422 05/17/19 0534  Weight: 51.4 kg 52.6 kg 52.6 kg    Examination:  General exam: Appears calm and comfortable  Respiratory system: Clear to auscultation. Respiratory effort normal. Cardiovascular system: S1 & S2 heard, RRR. No JVD, murmurs, rubs, gallops or clicks. No pedal edema. Gastrointestinal system: Abdomen is nondistended, soft and nontender. No organomegaly or masses felt. Normal bowel sounds heard.  PEG tube in place with some drainage around the site. Central nervous system: Alert and oriented. No focal neurological deficits. Extremities: Symmetric 5 x 5 power. Skin: No rashes, lesions or ulcers Psychiatry: Judgement and insight appear normal. Mood & affect appropriate.     Data Reviewed: I have personally reviewed following labs and imaging studies  CBC: Recent Labs  Lab 05/11/19 0329 05/12/19 1633 05/12/19 1708 05/13/19 [id number]  WBC 5.8 6.8  --  7.6  HGB 10.4* 12.2 12.6 10.7*  HCT 30.9* 38.0 37.0 33.7*  MCV 96.6 101.3*  --  100.6*  PLT 133* 221  --  [id number]   Basic Metabolic Panel: Recent Labs  Lab 05/11/19 0329 05/12/19 1633 05/12/19 1708 05/13/19 0436 05/15/19 1630 05/16/19 0352 05/16/19 1729 05/17/19 0417  NA 138 140 140 140  --   --   --   --   K 4.3 4.3 4.3 4.6  --   --   --   --   CL 108 105 105 110  --   --   --   --   CO2 25 26  --  23  --   --   --   --   GLUCOSE 64* 89 88 65*  --   --   --   --   BUN 14 23* 22* 21*  --   --   --   --   CREATININE 0.82 0.85 0.90 0.59  --   --   --   --   CALCIUM 7.9* 8.9  --  8.1*  --   --   --   --   MG 2.0   --   --   --  1.4* 1.3* 2.4 2.1  PHOS 2.8  --   --   --  3.1 2.9 2.8 2.8   GFR: Estimated Creatinine Clearance: 59.1 mL/min (by C-G formula based on SCr of 0.59 mg/dL). Liver Function Tests: Recent Labs  Lab 05/12/19 1633 05/13/19 0436  AST 24 23  ALT 29 23  ALKPHOS 154* 135*  BILITOT 0.9 0.2*  PROT 7.1 5.7*  ALBUMIN 3.1* 2.3*   No results for input(s): LIPASE, AMYLASE in the last 168 hours. No results for input(s): AMMONIA in the last 168 hours. Coagulation Profile: No results for input(s): INR, PROTIME in the last 168 hours. Cardiac Enzymes: No results for input(s): CKTOTAL, CKMB, CKMBINDEX, TROPONINI in the last 168 hours. BNP (last 3 results) No results for input(s): PROBNP in the last 8760 hours. HbA1C: No results for input(s): HGBA1C in the last 72 hours. CBG: Recent Labs  Lab 05/16/19 2027 05/16/19 2357 05/17/19 0055 05/17/19 0404 05/17/19 0739  GLUCAP 173* 81 117* 121* 116*   Lipid Profile: No results for input(s): CHOL, HDL, LDLCALC, TRIG, CHOLHDL, LDLDIRECT in the last 72 hours. Thyroid Function Tests: No results for input(s): TSH, T4TOTAL, FREET4, T3FREE, THYROIDAB in the last 72 hours. Anemia Panel: No results for input(s): VITAMINB12, FOLATE, FERRITIN, TIBC, IRON, RETICCTPCT in the last 72 hours. Sepsis Labs: Recent Labs  Lab 05/14/19 0128 05/14/19 0322  LATICACIDVEN 1.1 1.6    Recent Results (from the past 240 hour(s))  Urine culture     Status: Abnormal   Collection Time: 05/12/19  7:37 PM   Specimen: Urine, Random  Result Value Ref Range Status   Specimen Description   Final    URINE, RANDOM Performed at [facility] [address] [place], [place] [address]    Special Requests   Final    NONE Performed at [facility], [place] [address] [place], [place] [address]    Culture MULTIPLE SPECIES PRESENT, SUGGEST RECOLLECTION (A)  Final   Report Status 05/14/2019 FINAL  Final  SARS CORONAVIRUS 2 (TAT  6-24 HRS) Nasopharyngeal Nasopharyngeal Swab     Status: None   Collection Time: 05/12/19  9:31 PM   Specimen: Nasopharyngeal Swab  Result Value Ref  Range Status   SARS Coronavirus 2 NEGATIVE NEGATIVE Final    Comment: (NOTE) SARS-CoV-2 target nucleic acids are NOT DETECTED. The SARS-CoV-2 RNA is generally detectable in upper and lower respiratory specimens during the acute phase of infection. Negative results do not preclude SARS-CoV-2 infection, do not rule out co-infections with other pathogens, and should not be used as the sole basis for treatment or other patient management decisions. Negative results must be combined with clinical observations, patient history, and epidemiological information. The expected result is Negative. Fact Sheet for Patients: SugarRoll.be Fact Sheet for Healthcare Providers: https://www.[facility] [place].com/ This test is not yet approved or cleared by the [place] FDA and  has been authorized for detection and/or diagnosis of SARS-CoV-2 by FDA under an Emergency Use Authorization (EUA). This EUA will remain  in effect (meaning this test can be used) for the duration of the COVID-19 declaration under Section 56 4(b)(1) of the Act, 21 U.S.C. section 360bbb-3(b)(1), unless the authorization is terminated or revoked sooner. Performed at [facility], [place] [address], [place], [place] [address]          Radiology Studies: IR Cm Inj Any Colonic Tube W/Fluoro  Result Date: 05/15/2019 INDICATION: Leaking surgical gastrostomy EXAM: EXTERNAL MANIPULATION AND FLUOROSCOPIC INJECTION MEDICATIONS: None. ANESTHESIA/SEDATION: Moderate Sedation Time:  None. The patient was continuously monitored during the procedure by the interventional radiology nurse under my direct supervision. CONTRAST:  10 cc-administered into the gastric lumen. FLUOROSCOPY TIME:  Fluoroscopy Time: 0 minutes 6 seconds (0.4 mGy). COMPLICATIONS:  None immediate. PROCEDURE: Informed written consent was obtained from the patient after a thorough discussion of the procedural risks, benefits and alternatives. All questions were addressed. Maximal Sterile Barrier Technique was utilized including caps, mask, sterile gowns, sterile gloves, sterile drape, hand hygiene and skin antiseptic. A timeout was performed prior to the initiation of the procedure. The gastrostomy retention bumper was loose at the skin site. The external bumper was advanced closer to the skin for a snug fit. Contrast injection performed. Gastrostomy is patent with the balloon retention tip in the stomach. No further leakage at the skin site external to the patient. IMPRESSION: External manipulation and injection of the gastrostomy confirms adequate position and no further leakage. Electronically Signed   By: Jerilynn Mages.  Shick M.D.   On: 05/15/2019 12:52        Scheduled Meds: . cephALEXin  500 mg Oral Q12H  . Chlorhexidine Gluconate Cloth  6 each Topical Daily  . cyclobenzaprine  10 mg Oral TID  . enoxaparin (LOVENOX) injection  40 mg Subcutaneous QHS  . feeding supplement  1 Container Oral BID BM  . feeding supplement (OSMOLITE 1.5 CAL)  237 mL Per Tube TID PC  . feeding supplement (PRO-STAT SUGAR FREE 64)  30 mL Oral BID  . liver oil-zinc oxide   Topical BID  . magnesium gluconate  500 mg Oral Daily  . multivitamin with minerals  1 tablet Oral Daily  . OLANZapine  2.5 mg Oral Daily  . pantoprazole  40 mg Oral QHS  . QUEtiapine  50 mg Oral QHS  . sucralfate  1 g Oral TID AC   Continuous Infusions: . dextrose 75 mL/hr at 05/16/19 2200     LOS: 4 days      Georgette Shell, MD [facility]  If 7PM-7AM, please contact night-coverage [email or web address] Password [email or web address] 05/17/2019, 8:09 AM

## 2019-05-18 DIAGNOSIS — K9423 Gastrostomy malfunction: Principal | ICD-10-CM

## 2019-05-18 LAB — GLUCOSE, CAPILLARY
Glucose-Capillary: 54 mg/dL — ABNORMAL LOW (ref 70–99)
Glucose-Capillary: 128 mg/dL — ABNORMAL HIGH (ref 70–99)
Glucose-Capillary: 89 mg/dL (ref 70–99)
Glucose-Capillary: 72 mg/dL (ref 70–99)
Glucose-Capillary: 122 mg/dL — ABNORMAL HIGH (ref 70–99)
Glucose-Capillary: 103 mg/dL — ABNORMAL HIGH (ref 70–99)
Glucose-Capillary: 99 mg/dL (ref 70–99)

## 2019-05-18 MED ORDER — BLOOD GLUCOSE METER KIT: PACK | 0 refills | Status: DC

## 2019-05-18 MED ORDER — MAGNESIUM GLUCONATE 500 MG PO TABS: 500.0000 mg | ORAL_TABLET | Freq: Every day | ORAL | 1 refills | Status: DC

## 2019-05-18 MED ORDER — OSMOLITE 1.5 CAL PO LIQD: 237.0000 mL | Freq: Three times a day (TID) | ORAL | 0 refills | Status: AC

## 2019-05-18 MED ORDER — PRO-STAT SUGAR FREE PO LIQD: 30.0000 mL | Freq: Two times a day (BID) | ORAL | 0 refills | Status: DC

## 2019-05-18 MED ORDER — ALPRAZOLAM 1 MG PO TABS: 1.0000 mg | ORAL_TABLET | Freq: Every evening | ORAL | 0 refills | Status: DC | PRN

## 2019-05-18 MED ORDER — CYCLOBENZAPRINE HCL 10 MG PO TABS: 10.0000 mg | ORAL_TABLET | Freq: Three times a day (TID) | ORAL | 0 refills | Status: DC | PRN

## 2019-05-18 MED ORDER — ZINC OXIDE 40 % EX OINT: TOPICAL_OINTMENT | Freq: Two times a day (BID) | CUTANEOUS | 1 refills | Status: DC

## 2019-05-18 NOTE — Plan of Care (Signed)
  Problem: Education: °Goal: Knowledge of General Education information will improve °Description: Including pain rating scale, medication(s)/side effects and non-pharmacologic comfort measures °Outcome: Progressing °  °Problem: Health Behavior/Discharge Planning: °Goal: Ability to manage health-related needs will improve °Outcome: Progressing °  °Problem: Clinical Measurements: °Goal: Ability to maintain clinical measurements within normal limits will improve °Outcome: Progressing °Goal: Will remain free from infection °Outcome: Progressing °Goal: Diagnostic test results will improve °Outcome: Progressing °Goal: Respiratory complications will improve °Outcome: Progressing °Goal: Cardiovascular complication will be avoided °Outcome: Progressing °  °Problem: Activity: °Goal: Risk for activity intolerance will decrease °Outcome: Progressing °  °Problem: Nutrition: °Goal: Adequate nutrition will be maintained °Outcome: Progressing °  °Problem: Coping: °Goal: Level of anxiety will decrease °Outcome: Progressing °  °Problem: Elimination: °Goal: Will not experience complications related to bowel motility °Outcome: Progressing °Goal: Will not experience complications related to urinary retention °Outcome: Progressing °  °Problem: Skin Integrity: °Goal: Risk for impaired skin integrity will decrease °Outcome: Progressing °  °Problem: Safety: °Goal: Ability to remain free from injury will improve °Outcome: Progressing °  °Problem: Pain Managment: °Goal: General experience of comfort will improve °Outcome: Progressing °  °

## 2019-05-18 NOTE — Progress Notes (Signed)
Inpatient Diabetes Program Recommendations  AACE/ADA: New Consensus Statement on Inpatient Glycemic Control (2015)  Target Ranges:  Prepandial:   less than 140 mg/dL      Peak postprandial:   less than 180 mg/dL (1-2 hours)      Critically ill patients:  140 - 180 mg/dL   Lab Results  Component Value Date   GLUCAP 103 (H) 05/18/2019   HGBA1C 3.8 (L) 04/30/2019    Review of Glycemic Control  Diabetes history: None Outpatient Diabetes medications: N/A Current orders for Inpatient glycemic control: None  Consult to speak with pt about hypoglycemia.  Inpatient Diabetes Program Recommendations:     Discussed hypoglycemia s/s and treatment. Discussed 15:15 rule. Discussed importance of eating small frequent meals and reducing intake of simple CHOs.   Answered questions. Discussed with RN.  Thank you. Lorenda Peck, RD, LDN, CDE Inpatient Diabetes Coordinator [phone]

## 2019-05-18 NOTE — Progress Notes (Signed)
Educated pt about home feedings dressing changes, and wound care via teach back method. Pt demonstrated appropriately by teach back method. Dressing changed this evening according to dr orders- drainage to dressing removed is scant. Pt had no further questions. Educated about hypoglycemia and glocose checks by teach back method- no further questions observed- explained to pt that prescription for glucose testing materials is available at any pharmacy for the prescription. Prescription given along with dressing supplies and syringe. Pt is waiting for ride.

## 2019-05-18 NOTE — TOC Initial Note (Signed)
Transition of Care [facility]) - Initial/Assessment Note    Patient Details  Name: Angie [name] MRN: [id number] Date of Birth: 02/06/1966  Transition of Care [facility]) CM/SW Contact:    Lia Hopping, [place] Phone Number: 05/18/2019, 1:38 PM  Clinical Narrative:                 CSW met with the patient and spouse at bedside. Patient was recently discharge from [facility]  Patient has G-tube and per the dietician: The patient will Continue 237 ml Osmolite 1.5 TID via G-tube gravity feeds. Provides 1065 kcals, 44g protein, and 543 ml H2O. -Free water flush with 20 ml before and after each feed.  CSW reached out to [place] and notified him of the order for the feedings and DME 3 IN 1.   Patient concern about "high sugars" Blood Glucose Meter kit script was written and nurse and diabetic coordinator to educate the patient.   Physical Therapy and RN arranged through [facility] ([place]) [facility]-Will take care of tube feedings and supplies.   Expected Discharge Plan: [place] Barriers to Discharge: No Barriers Identified   Patient Goals and CMS Choice Patient states their goals for this hospitalization and ongoing recovery are:: to go home CMS Medicare.gov Compare Post Acute Care list provided to:: Patient Choice offered to / list presented to : Patient  Expected Discharge Plan and Services Expected Discharge Plan: [place] In-house Referral: Clinical Social Work Discharge Planning Services: CM Consult Post Acute Care Choice: Durable Medical Equipment Living arrangements for the past 2 months: Single Family Home Expected Discharge Date: 05/18/19               DME Arranged: Francesco Runner feeding, 3-N-1 DME Agency: [facility] Date DME Agency Contacted: 05/18/19 Time DME Agency Contacted: [id number] Representative spoke with at DME Agency: [place]: PT, RN [place] Agency: [place] ([place]) Date [place]:  05/18/19 Time [place]: [phone] Representative spoke with at [place]: Santiago Glad  Prior Living Arrangements/Services Living arrangements for the past 2 months: Single Family Home Lives with:: Adult Children, Spouse Patient language and need for interpreter reviewed:: No Do you feel safe going back to the place where you live?: Yes      Need for Family Participation in Patient Care: Yes (Comment) Care giver support system in place?: Yes (comment)   Criminal Activity/Legal Involvement Pertinent to Current Situation/Hospitalization: No - Comment as needed  Activities of Daily Living Home Assistive Devices/Equipment: Walker (specify type) ADL Screening (condition at time of admission) Patient's cognitive ability adequate to safely complete daily activities?: Yes Is the patient deaf or have difficulty hearing?: No Does the patient have difficulty seeing, even when wearing glasses/contacts?: No Does the patient have difficulty concentrating, remembering, or making decisions?: Yes Patient able to express need for assistance with ADLs?: Yes Does the patient have difficulty dressing or bathing?: Yes Independently performs ADLs?: No Communication: Independent Dressing (OT): Needs assistance Bathing: Needs assistance Toileting: Needs assistance Does the patient have difficulty walking or climbing stairs?: Yes Weakness of Legs: Both Weakness of Arms/Hands: Both  Permission Sought/Granted Permission sought to share information with : Case Manager Permission granted to share information with : Yes, Verbal Permission Granted     Permission granted to share info w AGENCY: [place] agency  Permission granted to share info w Relationship: Spouse     Emotional Assessment Appearance:: Appears stated age Attitude/Demeanor/Rapport: Engaged Affect (typically observed):  Accepting Orientation: : Oriented to Self, Oriented to Place, Oriented to  Time, Oriented to Situation Alcohol / Substance Use:  Not Applicable Psych Involvement: No (comment)  Admission diagnosis:  Hypoglycemia associated with diabetes ([place]) [E11.649] G tube feedings ([place]) [Z93.1] Drainage from gastrostomy tube site [facility]) [K94.23] Gastrostomy tube dysfunction [facility]) [K94.23] Patient Active Problem List   Diagnosis Date Noted  . 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[place]) 05/13/2019  . Leaking at G tube site 05/12/2019  . Failure to thrive in adult   . Nausea & vomiting   . Hypothermia 04/29/2019  . 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 Symptomatic Anemia 04/25/2017  . Symptomatic anemia 04/25/2017  . Loss of weight   . Dysphagia   . Hx of laparoscopic gastrostomy tube insertion 05/04/2019 09/06/2016  . At risk for adverse drug event 08/25/2016  . Malnutrition following gastric bypass surgery 08/16/2016  . Anastomotic ulcer 07/21/2016  . Transient alteration of awareness   . Alcohol use 04/27/2016  . Chronic pain syndrome 04/27/2016  . Iron deficiency anemia 04/27/2016  . B12 deficiency 04/27/2016  . Biliary anastomotic occlusion   . Hypoglycemia 04/22/2016  . Syncope 04/22/2016  . Protein-calorie malnutrition, severe 04/22/2016  . Intractable nausea and vomiting   . Abdominal pain, chronic, epigastric   . Hypotension 02/01/2016  . Hyponatremia 02/01/2016  . Subacromial impingement of left shoulder 07/06/2013  . Trochanteric bursitis of left hip 05/11/2013  . Arthritis 05/16/2012  . 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/16/2012   PCP:  Harvie Junior, MD Pharmacy:   [facility], [place] [address] Phone: [phone] Fax: [phone]     Social Determinants of Health (SDOH) Interventions    Readmission Risk Interventions No flowsheet data found.

## 2019-05-18 NOTE — Discharge Summary (Signed)
Physician Discharge Summary  Angie [name] WHQ:[id number] DOB: November 09, 1965 DOA: 05/12/2019  PCP: Harvie Junior, MD  Admit date: 05/12/2019 Discharge date: 05/18/2019  Admitted From: Home Disposition:  Home  Discharge Condition:Stable CODE STATUS:FULL Diet recommendation: regular,tube feeding  Brief/Interim Summary:  Brief Narrative:54 y.o.femalewith medical history significant ofhypertension, depression, diabetes mellitus type 2, history of gastric bypass surgery with long stented complications, migraine and failure to thrive presented to ED for evaluation of discharge from G-tube site. Patient states that the problem started about 8 days ago at the site of G-tube and continue to worsen and now she is having severe drainage around the site of G-tube whenever she has tube feeds. Patient is also complaining of soreness and mild tenderness around the area where G-tube is placed. Patient states that she is able to tolerate p.o. food and denies any fever, chills, chest pain, nausea, vomiting and urinary symptoms. Patient was recently admitted for hypoglycemia and profound electrolyte abnormalities and G-tube was placed on May 04, 2019 and patient was discharged home yesterday morning. Patient also returned to the ED yesterday evening with a concern for possible overdose and she was given Narcan and discharged to home after her condition improved.  ED Course:On arrival to ED, patient had blood pressure of 107/81, heart rate 93, temperature 97.6, respiratory rate 17 and oxygen saturation 100% on room air. Point-of-care blood glucose was 34 while BMP showed blood glucose of 82. ALT was elevated and albumin 3.1. Chest x-ray was negative for acute pulmonary problem. Patient was given multiple doses of D50 in the ED for hypoglycemia and general surgery was also contacted for leakage from G-tube. General surgery recommended to use DuoDERM and wound care consult at this time and if this  does not help then they will try to upsize the G-tube to prevent further leakage.  Hospital course: She was seen by IR, general surgery for the concern of leakage around the G-tube.  No intervention planned.  Barrier cream recommended.  She has a follow-up appointment with Dr. Kieth Brightly as an outpatient.  Hospital course also remarkable for persistent hypoglycemia which looks to have resolved now.  She has been recommended to monitor her glucose very closely at home.  She has been prescribed glucometer and other equipment.  She was seen by physical therapy and recommended home health.  She is hemodynamically stable for discharge home today.   Following problems were addressed during her hospitalization:  #1 leakage from the PEG tube site-patient had PEG placed this month due to being malnourished with severe electrolyte abnormalities and not enough p.o. intake and failure to thrive.s/p G tube manipulation and injection.G tube retention bumper was loose at the skin site.bumper was advanced to a snug fit.patient continues to have drainage around peg tube   No intervention planned.  Barrier cream recommended.  She has a follow-up appointment with Dr. Kieth Brightly as an outpatient. .  #2 hypoglycemia-currently stable.  She was on D10.  Currently maintaining her sugars without glucose infusion. Looking through the records work-up was done in the past to rule out insulinoma. Patient has a history of type 2 diabetes not on any medications at home or here.Patient will need a glucometer lancets and strips upon discharge.  #3 chronic pain issues-She is on Xanax, Flexeril, Zyprexa, Seroquel, oxycodone. We have changed them to as needed.  #4 hypertension blood pressure currently stable.Continue to hold lisinopril and hydrochlorothiazide.   #5 depression : On  Seroquel and Zyprexa from home  #6 hypomagnesemia : Continue  magnesium supplementation    Discharge Diagnoses:  Principal Problem:   Leaking  at G tube site Active Problems:   History of Roux-en-Y gastric bypass 2007   Hypoglycemia   Intractable nausea and vomiting   Abdominal pain, chronic, epigastric   Protein-calorie malnutrition, severe   Malnutrition following gastric bypass surgery   Hx of laparoscopic gastrostomy tube insertion 05/04/2019   Failure to thrive in adult   Gastrostomy tube dysfunction [facility])    Discharge Instructions  Discharge Instructions    DME Bedside commode   Complete by: As directed    Patient needs a bedside commode to treat with the following condition: Need for assistance due to unsteady gait   Diet - low sodium heart healthy   Complete by: As directed    Discharge instructions   Complete by: As directed    1)Please follow up with your PCP in a week. 2)Follow up with general surgery as an outpatient.  You will be called for appointment. 3)Monitor your blood glucose at home.   Increase activity slowly   Complete by: As directed      Allergies as of 05/18/2019      Reactions   Iron Anaphylaxis   Had acute allergy with tachycardia, attention and generalized itching shortly after receiving nulecit ( iron gluconate) infusion.   Lactose Intolerance (gi)       Medication List    STOP taking these medications   lisinopril-hydrochlorothiazide 10-12.5 MG tablet Commonly known as: ZESTORETIC     TAKE these medications   ALPRAZolam 1 MG tablet Commonly known as: XANAX Take 1 tablet (1 mg total) by mouth at bedtime as needed for anxiety. What changed:   when to take this  reasons to take this   blood glucose meter kit and supplies Dispense based on patient and insurance preference. Use up to four times daily as directed. (FOR ICD-10 E10.9, E11.9).   Carafate 1 GM/10ML suspension Generic drug: sucralfate Take 10 mLs by mouth 3 (three) times daily before meals.   cyclobenzaprine 10 MG tablet Commonly known as: FLEXERIL Take 1 tablet (10 mg total) by mouth 3 (three) times daily as  needed for muscle spasms. What changed:   when to take this  reasons to take this   feeding supplement (OSMOLITE 1.5 CAL) Liqd Place 237 mLs into feeding tube 3 (three) times daily after meals.   feeding supplement (PRO-STAT SUGAR FREE 64) Liqd Take 30 mLs by mouth 2 (two) times daily.   Gas Relief 40 MG/0.6ML drops Generic drug: simethicone Take 1 mL by mouth every 6 (six) hours as needed for flatulence.   lidocaine 5 % ointment Commonly known as: XYLOCAINE Apply 1 application topically 3 (three) times daily. Apply to Bilateral shoulders   liver oil-zinc oxide 40 % ointment Commonly known as: DESITIN Apply topically 2 (two) times daily. Apply around feeding tube   magnesium gluconate 500 MG tablet Commonly known as: MAGONATE Take 1 tablet (500 mg total) by mouth daily. Start taking on: May 19, 2019   multivitamin with iron-minerals liquid Take 5 mLs by mouth daily.   OLANZapine 2.5 MG tablet Commonly known as: ZYPREXA Take 2.5 mg by mouth daily.   oxyCODONE 15 MG immediate release tablet Commonly known as: ROXICODONE Take 15 mg by mouth every 6 (six) hours as needed for pain. What changed: Another medication with the same name was removed. Continue taking this medication, and follow the directions you see here.   oxymetazoline 0.05 % nasal spray Commonly  known as: AFRIN Place 2 sprays into both nostrils 2 (two) times daily as needed for congestion.   pantoprazole 40 MG tablet Commonly known as: PROTONIX Take 1 tablet (40 mg total) by mouth at bedtime.   QUEtiapine 50 MG tablet Commonly known as: SEROQUEL Take 50 mg by mouth at bedtime.            Durable Medical Equipment  (From admission, onward)         Start     Ordered   05/16/19 0000  DME Bedside commode    Question:  Patient needs a bedside commode to treat with the following condition  Answer:  Need for assistance due to unsteady gait   05/16/19 1314         Follow-up Information     Harvie Junior, MD. Schedule an appointment as soon as possible for a visit in 1 week(s).   Specialty: Family Medicine Contact information: [place] [address] [phone]        Kinsinger, Arta Bruce, MD Follow up.   Specialty: General Surgery Why: follow up at your already scheduled appointment Contact information: [address] [place] [address] [phone]          Allergies  Allergen Reactions  . Iron Anaphylaxis    Had acute allergy with tachycardia, attention and generalized itching shortly after receiving nulecit ( iron gluconate) infusion.  . Lactose Intolerance (Gi)     Consultations:  General surgery, IR   Procedures/Studies: DG Pelvis 1-2 Views  Result Date: 04/29/2019 CLINICAL DATA:  Pain status post fall EXAM: PELVIS - 1-2 VIEW COMPARISON:  July 20, 2014 FINDINGS: There is no evidence of pelvic fracture or diastasis. No pelvic bone lesions are seen. IMPRESSION: Negative. Electronically Signed   By: Constance Holster M.D.   On: 04/29/2019 16:41   CT Head Wo Contrast  Result Date: 04/29/2019 CLINICAL DATA:  Patient unresponsive. Minor head trauma. EXAM: CT HEAD WITHOUT CONTRAST CT CERVICAL SPINE WITHOUT CONTRAST TECHNIQUE: Multidetector CT imaging of the head and cervical spine was performed following the standard protocol without intravenous contrast. Multiplanar CT image reconstructions of the cervical spine were also generated. COMPARISON:  CT head 04/22/2016. FINDINGS: CT HEAD FINDINGS Brain: No evidence of acute infarction, hemorrhage, hydrocephalus, extra-axial collection or mass lesion/mass effect. Vascular: No hyperdense vessel or unexpected calcification. Skull: Normal. Negative for fracture or focal lesion. Sinuses/Orbits: No acute finding. Other: None. CT CERVICAL SPINE FINDINGS Alignment: Normal. Skull base and vertebrae: The vertebral body heights are well preserved. No fracture the Soft tissues and spinal  canal: No prevertebral fluid or swelling. No visible canal hematoma. Disc levels:  Normal Upper chest: Unremarkable Other: None IMPRESSION: 1. No acute intracranial abnormalities. 2. No evidence for cervical spine fracture. Electronically Signed   By: Kerby Moors M.D.   On: 04/29/2019 18:12   CT Chest W Contrast  Result Date: 04/30/2019 CLINICAL DATA:  Abdomen distension EXAM: CT CHEST, ABDOMEN, AND PELVIS WITH CONTRAST TECHNIQUE: Multidetector CT imaging of the chest, abdomen and pelvis was performed following the standard protocol during bolus administration of intravenous contrast. CONTRAST:  89m OMNIPAQUE IOHEXOL 300 MG/ML  SOLN COMPARISON:  CT 08/12/2017 FINDINGS: CT CHEST FINDINGS Cardiovascular: Nonaneurysmal aorta. Coronary vascular calcification. Normal heart size. No pericardial effusion. Mediastinum/Nodes: Midline trachea. No thyroid mass. No significant adenopathy. Esophagus unremarkable Lungs/Pleura: Punctate 1-2 mm nodules at the apices. No consolidation or pneumothorax. Small left-sided pleural effusion. Musculoskeletal: No chest wall mass or suspicious  bone lesions identified. CT ABDOMEN PELVIS FINDINGS Hepatobiliary: Hepatic steatosis. Distended gallbladder with increased density probably sludge. No calcified stone. Stable enlargement of the extrahepatic common bile duct, measuring up to 9 mm. Pancreas: Atrophic.  No inflammatory changes Spleen: Normal in size without focal abnormality. Adrenals/Urinary Tract: Adrenal glands are normal. No hydronephrosis. Subcentimeter hypodensity within the mid to lower left kidney too small to further characterize. Distended urinary bladder. Stomach/Bowel: Status post gastric bypass. Anterior extension of the gastric body with linear scarring in the abdominal wall presumably due to prior gastrostomy tube. Portion of the gastric wall extends into the old tube tract. Negative for bowel obstruction or bowel wall thickening. Negative appendix.  Vascular/Lymphatic: Nonaneurysmal aorta.  No significant adenopathy Reproductive: Uterus and bilateral adnexa are unremarkable. Other: No free air.  Small amount of abdominopelvic ascites. Musculoskeletal: No acute or significant osseous findings. IMPRESSION: 1. Trace left-sided pleural effusion.  No acute airspace disease. 2. Punctate nodules at the apices. No follow-up needed if patient is low-risk (and has no known or suspected primary neoplasm). Non-contrast chest CT can be considered in 12 months if patient is high-risk. This recommendation follows the consensus statement: Guidelines for Management of Incidental Pulmonary Nodules Detected on CT Images: From the Fleischner Society 2017; Radiology 2017; 284:228-243. 3. Dilated gallbladder with increased intraluminal density, possible sludge. No inflammatory changes by CT. Stable enlargement of the extrahepatic common bile duct. 4. Hepatic steatosis. 5. Small amount of ascites within the abdomen and pelvis 6. 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 Electronically Signed   By: Donavan Foil M.D.   On: 04/30/2019 01:56   CT Cervical Spine Wo Contrast  Result Date: 04/29/2019 CLINICAL DATA:  Patient unresponsive. Minor head trauma. EXAM: CT HEAD WITHOUT CONTRAST CT CERVICAL SPINE WITHOUT CONTRAST TECHNIQUE: Multidetector CT imaging of the head and cervical spine was performed following the standard protocol without intravenous contrast. Multiplanar CT image reconstructions of the cervical spine were also generated. COMPARISON:  CT head 04/22/2016. FINDINGS: CT HEAD FINDINGS Brain: No evidence of acute infarction, hemorrhage, hydrocephalus, extra-axial collection or mass lesion/mass effect. Vascular: No hyperdense vessel or unexpected calcification. Skull: Normal. Negative for fracture or focal lesion. Sinuses/Orbits: No acute finding. Other: None. CT CERVICAL SPINE FINDINGS Alignment: Normal. Skull base and vertebrae: The vertebral body  heights are well preserved. No fracture the Soft tissues and spinal canal: No prevertebral fluid or swelling. No visible canal hematoma. Disc levels:  Normal Upper chest: Unremarkable Other: None IMPRESSION: 1. No acute intracranial abnormalities. 2. No evidence for cervical spine fracture. Electronically Signed   By: Kerby Moors M.D.   On: 04/29/2019 18:12   MR THORACIC SPINE WO CONTRAST  Result Date: 04/30/2019 CLINICAL DATA:  Increasing weakness over the past 3 weeks with edema in both lower extremities and difficulty walking. No known injury. EXAM: MRI THORACIC SPINE WITHOUT CONTRAST TECHNIQUE: Multiplanar, multisequence MR imaging of the thoracic spine was performed. No intravenous contrast was administered. COMPARISON:  Plain films thoracic spine 07/20/2014. FINDINGS: Alignment:  Normal. Vertebrae: No fracture, evidence of discitis, or bone lesion. Cord:  Normal signal and morphology. Paraspinal and other soft tissues: Small bilateral pleural effusions are seen. Disc levels: The central spinal canal and neural foramina are widely patent at all levels. Minimal disc bulging at T9-10 is noted. IMPRESSION: Normal appearing thoracic spine and spinal cord. Negative for stenosis. Small bilateral pleural effusions. Electronically Signed   By: Inge Rise M.D.   On: 04/30/2019 08:49   MR LUMBAR SPINE WO CONTRAST  Result Date: 04/30/2019 CLINICAL DATA:  Increasing weakness over the past 3 weeks with bilateral lower extremity edema and difficulty walking. EXAM: MRI LUMBAR SPINE WITHOUT CONTRAST TECHNIQUE: Multiplanar, multisequence MR imaging of the lumbar spine was performed. No intravenous contrast was administered. COMPARISON:  None. FINDINGS: Segmentation:  Standard. Alignment:  Normal. Vertebrae:  No fracture, evidence of discitis, or bone lesion. Conus medullaris and cauda equina: Conus extends to the L2 level. Conus and cauda equina appear normal. Paraspinal and other soft tissues: Negative. Disc  levels: The central spinal canal and neural foramina are widely patent at all levels. Very shallow disc bulge is seen at L4-5. Mild-to-moderate facet degenerative disease is seen at L4-5 and L5-S1. IMPRESSION: Mild lower lumbar spondylosis without central canal or foraminal narrowing. No finding to explain the patient's symptoms. Electronically Signed   By: Inge Rise M.D.   On: 04/30/2019 08:51   CT ABDOMEN PELVIS W CONTRAST  Result Date: 04/30/2019 CLINICAL DATA:  Abdomen distension EXAM: CT CHEST, ABDOMEN, AND PELVIS WITH CONTRAST TECHNIQUE: Multidetector CT imaging of the chest, abdomen and pelvis was performed following the standard protocol during bolus administration of intravenous contrast. CONTRAST:  49m OMNIPAQUE IOHEXOL 300 MG/ML  SOLN COMPARISON:  CT 08/12/2017 FINDINGS: CT CHEST FINDINGS Cardiovascular: Nonaneurysmal aorta. Coronary vascular calcification. Normal heart size. No pericardial effusion. Mediastinum/Nodes: Midline trachea. No thyroid mass. No significant adenopathy. Esophagus unremarkable Lungs/Pleura: Punctate 1-2 mm nodules at the apices. No consolidation or pneumothorax. Small left-sided pleural effusion. Musculoskeletal: No chest wall mass or suspicious bone lesions identified. CT ABDOMEN PELVIS FINDINGS Hepatobiliary: Hepatic steatosis. Distended gallbladder with increased density probably sludge. No calcified stone. Stable enlargement of the extrahepatic common bile duct, measuring up to 9 mm. Pancreas: Atrophic.  No inflammatory changes Spleen: Normal in size without focal abnormality. Adrenals/Urinary Tract: Adrenal glands are normal. No hydronephrosis. Subcentimeter hypodensity within the mid to lower left kidney too small to further characterize. Distended urinary bladder. Stomach/Bowel: Status post gastric bypass. Anterior extension of the gastric body with linear scarring in the abdominal wall presumably due to prior gastrostomy tube. Portion of the gastric wall extends  into the old tube tract. Negative for bowel obstruction or bowel wall thickening. Negative appendix. Vascular/Lymphatic: Nonaneurysmal aorta.  No significant adenopathy Reproductive: Uterus and bilateral adnexa are unremarkable. Other: No free air.  Small amount of abdominopelvic ascites. Musculoskeletal: No acute or significant osseous findings. IMPRESSION: 1. Trace left-sided pleural effusion.  No acute airspace disease. 2. Punctate nodules at the apices. No follow-up needed if patient is low-risk (and has no known or suspected primary neoplasm). Non-contrast chest CT can be considered in 12 months if patient is high-risk. This recommendation follows the consensus statement: Guidelines for Management of Incidental Pulmonary Nodules Detected on CT Images: From the Fleischner Society 2017; Radiology 2017; 284:228-243. 3. Dilated gallbladder with increased intraluminal density, possible sludge. No inflammatory changes by CT. Stable enlargement of the extrahepatic common bile duct. 4. Hepatic steatosis. 5. Small amount of ascites within the abdomen and pelvis 6. 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 Electronically Signed   By: KDonavan FoilM.D.   On: 04/30/2019 01:56   [place]NAL  Result Date: 05/03/2019 CLINICAL DATA:  54year old female with questionable hydronephrosis. Prominent renal pyramids on recent ultrasound. EXAM: RENAL / URINARY TRACT ULTRASOUND COMPLETE COMPARISON:  CT Chest, Abdomen, and Pelvis 04/30/2019, and abdomen ultrasound 04/30/2019. FINDINGS: Right Kidney: Renal measurements: 9.2 x 4.6 x 5.3 centimeters = volume: 117 mL. Conspicuous corticomedullary differentiation again noted, this might  indicate hyperechogenicity of the renal cortex, which is isoechoic to the liver. No hydronephrosis or right renal mass. Left Kidney: Renal measurements: 8.7 x 4.7 x 4.8 centimeters = volume: 103 mL. Similar exaggerated corticomedullary differentiation to that on the right. No left  hydronephrosis or left renal mass. Bladder: Appears normal for degree of bladder distention. Other: None. IMPRESSION: 1. No hydronephrosis or acute renal findings. 2. Exaggerated corticomedullary differentiation/renal pyramids again noted, perhaps related to increased cortical echogenicity from chronic medical renal disease. Electronically Signed   By: Genevie Ann M.D.   On: 05/03/2019 15:40   MR 3D Recon At Scanner  Result Date: 05/01/2019 CLINICAL DATA:  Biliary colic, distended gallbladder with gall sludge by prior CT and ultrasound EXAM: MRI ABDOMEN WITHOUT AND WITH CONTRAST (INCLUDING MRCP) TECHNIQUE: Multiplanar multisequence MR imaging of the abdomen was performed both before and after the administration of intravenous contrast. Heavily T2-weighted images of the biliary and pancreatic ducts were obtained, and three-dimensional MRCP images were rendered by post processing. CONTRAST:  5.41m GADAVIST GADOBUTROL 1 MMOL/ML IV SOLN COMPARISON:  None. FINDINGS: Lower chest: Trace bilateral pleural effusions. Hepatobiliary: No hepatic mass. Severe hepatic steatosis. The gallbladder is distended and sludge filled. No discrete calculi are appreciated. The common bile duct measures up to 7 mm in caliber distally to the ampulla, and there no significant intrahepatic biliary ductal dilatation. There is no calculus or other obstructing lesion appreciated within the biliary tract. Pancreas: No mass, inflammatory changes, or other parenchymal abnormality identified. Spleen:  Within normal limits in size and appearance. Adrenals/Urinary Tract: No masses identified. Moderate bilateral hydronephrosis and hydroureter of the included portions of the ureters with a distended urinary bladder seen in the included lower abdomen. Stomach/Bowel: Visualized portions within the abdomen are unremarkable. Vascular/Lymphatic: No pathologically enlarged lymph nodes identified. No abdominal aortic aneurysm demonstrated. Other:  Anasarca. Trace  ascites. Musculoskeletal: No suspicious bone lesions identified. IMPRESSION: 1. The gallbladder is distended and sludge filled. No discrete calculi are appreciated. The common bile duct measures up to 7 mm in caliber distally to the ampulla, and there no significant intrahepatic biliary ductal dilatation. There is no calculus or other obstructing lesion appreciated within the biliary tract. 2. There is no gallbladder wall thickening or pericholecystic fluid to suggest acute cholecystitis. 3. Severe hepatic steatosis. 4. Moderate bilateral hydronephrosis and hydroureter of the included portions of the ureters with distended urinary bladder seen in the included lower abdomen. Correlate for urinary retention. 5. Pleural effusions, trace ascites, and anasarca. Electronically Signed   By: AEddie CandleM.D.   On: 05/01/2019 08:28   IR Cm Inj Any Colonic Tube W/Fluoro  Result Date: 05/15/2019 INDICATION: Leaking surgical gastrostomy EXAM: EXTERNAL MANIPULATION AND FLUOROSCOPIC INJECTION MEDICATIONS: None. ANESTHESIA/SEDATION: Moderate Sedation Time:  None. The patient was continuously monitored during the procedure by the interventional radiology nurse under my direct supervision. CONTRAST:  10 cc-administered into the gastric lumen. FLUOROSCOPY TIME:  Fluoroscopy Time: 0 minutes 6 seconds (0.4 mGy). COMPLICATIONS: None immediate. PROCEDURE: Informed written consent was obtained from the patient after a thorough discussion of the procedural risks, benefits and alternatives. All questions were addressed. Maximal Sterile Barrier Technique was utilized including caps, mask, sterile gowns, sterile gloves, sterile drape, hand hygiene and skin antiseptic. A timeout was performed prior to the initiation of the procedure. The gastrostomy retention bumper was loose at the skin site. The external bumper was advanced closer to the skin for a snug fit. Contrast injection performed. Gastrostomy is patent with the balloon retention  tip in the stomach. No further leakage at the skin site external to the patient. IMPRESSION: External manipulation and injection of the gastrostomy confirms adequate position and no further leakage. Electronically Signed   By: Jerilynn Mages.  Shick M.D.   On: 05/15/2019 12:52   DG ABDOMEN PEG TUBE LOCATION  Result Date: 05/12/2019 CLINICAL DATA:  The patient complains that her G-tube is leaking. EXAM: ABDOMEN - 1 VIEW COMPARISON:  May 08, 2019 FINDINGS: The bowel gas pattern is normal. Oral contrast has been injected through the gastrostomy tube and outlines the nearly decompressed stomach. No extraluminal contrast is noted on this single frontal view. IMPRESSION: 1. Nonobstructive bowel gas pattern. 2. Gastrostomy tube in satisfactory position. Electronically Signed   By: Fidela Salisbury M.D.   On: 05/12/2019 18:40   DG ABDOMEN PEG TUBE LOCATION  Result Date: 05/08/2019 CLINICAL DATA:  Evaluate for leak. EXAM: ABDOMEN - 1 VIEW COMPARISON:  Upper GI 05/03/2019 FINDINGS: Water-soluble contrast material was injected into the indwelling left upper quadrant gastrostomy tube. This results in opacification of the stomach lumen without extravasation to suggest leak. Enteric contrast material within the colon is noted which is likely from upper GI performed 05/03/2019 IMPRESSION: 1. Contrast injection of the indwelling gastrostomy tube opacifies the stomach without evidence for contrast extravasation to suggest leak. Electronically Signed   By: Kerby Moors M.D.   On: 05/08/2019 09:24   DG Chest Portable 1 View  Result Date: 05/12/2019 CLINICAL DATA:  Hypoglycemia EXAM: PORTABLE CHEST 1 VIEW COMPARISON:  April 29, 2019 FINDINGS: The heart size and mediastinal contours are within normal limits. Both lungs are clear. The visualized skeletal structures are unremarkable. IMPRESSION: No active disease. Electronically Signed   By: Constance Holster M.D.   On: 05/12/2019 20:59   DG Chest Portable 1 View  Result  Date: 04/29/2019 CLINICAL DATA:  Fall. EXAM: PORTABLE CHEST 1 VIEW COMPARISON:  07/20/2014. FINDINGS: The heart size and mediastinal contours are within normal limits. Both lungs are clear. The visualized skeletal structures are unremarkable. IMPRESSION: No active disease. Electronically Signed   By: Kerby Moors M.D.   On: 04/29/2019 16:38   DG UGI W SINGLE CM (SOL OR THIN BA)  Result Date: 05/03/2019 CLINICAL DATA:  Remote Roux-en-Y gastric bypass. Nausea and vomiting. Evaluate pouch and emptying. EXAM: UPPER GI SERIES WITH KUB TECHNIQUE: After obtaining a scout radiograph a routine upper GI series was performed using thin barium FLUOROSCOPY TIME:  Fluoroscopy Time:  2 minutes 24 seconds Radiation Exposure Index (if provided by the fluoroscopic device): Number of Acquired Spot Images: 0 COMPARISON:  CT 04/30/2019 FINDINGS: Fluoroscopic evaluation of swallowing demonstrates poor esophageal peristalsis with full column stasis and slow emptying of the esophagus. No fixed stricture. There is a very small gastric pouch with immediate emptying of the pouch in to small bowel. Small bowel is normal caliber. IMPRESSION: Very small pouch with immediate emptying into small bowel. Slow clearance of the esophagus with esophageal dysmotility. No fixed stricture. Electronically Signed   By: Rolm Baptise M.D.   On: 05/03/2019 09:22   MR ABDOMEN MRCP W WO CONTAST  Result Date: 05/01/2019 CLINICAL DATA:  Biliary colic, distended gallbladder with gall sludge by prior CT and ultrasound EXAM: MRI ABDOMEN WITHOUT AND WITH CONTRAST (INCLUDING MRCP) TECHNIQUE: Multiplanar multisequence MR imaging of the abdomen was performed both before and after the administration of intravenous contrast. Heavily T2-weighted images of the biliary and pancreatic ducts were obtained, and three-dimensional MRCP images were rendered by post processing. CONTRAST:  5.36m GADAVIST GADOBUTROL 1 MMOL/ML IV SOLN COMPARISON:  None. FINDINGS: Lower chest:  Trace bilateral pleural effusions. Hepatobiliary: No hepatic mass. Severe hepatic steatosis. The gallbladder is distended and sludge filled. No discrete calculi are appreciated. The common bile duct measures up to 7 mm in caliber distally to the ampulla, and there no significant intrahepatic biliary ductal dilatation. There is no calculus or other obstructing lesion appreciated within the biliary tract. Pancreas: No mass, inflammatory changes, or other parenchymal abnormality identified. Spleen:  Within normal limits in size and appearance. Adrenals/Urinary Tract: No masses identified. Moderate bilateral hydronephrosis and hydroureter of the included portions of the ureters with a distended urinary bladder seen in the included lower abdomen. Stomach/Bowel: Visualized portions within the abdomen are unremarkable. Vascular/Lymphatic: No pathologically enlarged lymph nodes identified. No abdominal aortic aneurysm demonstrated. Other:  Anasarca. Trace ascites. Musculoskeletal: No suspicious bone lesions identified. IMPRESSION: 1. The gallbladder is distended and sludge filled. No discrete calculi are appreciated. The common bile duct measures up to 7 mm in caliber distally to the ampulla, and there no significant intrahepatic biliary ductal dilatation. There is no calculus or other obstructing lesion appreciated within the biliary tract. 2. There is no gallbladder wall thickening or pericholecystic fluid to suggest acute cholecystitis. 3. Severe hepatic steatosis. 4. Moderate bilateral hydronephrosis and hydroureter of the included portions of the ureters with distended urinary bladder seen in the included lower abdomen. Correlate for urinary retention. 5. Pleural effusions, trace ascites, and anasarca. Electronically Signed   By: AEddie CandleM.D.   On: 05/01/2019 08:28   ECHOCARDIOGRAM COMPLETE  Result Date: 04/30/2019   ECHOCARDIOGRAM REPORT   Patient Name:   PRYELLE RUVALCABAPAGE Date of Exam: 04/30/2019 Medical Rec #:   [id number]      Height:       60.0 in Accession #:    [id number]     Weight:       127.4 lb Date of Birth:  [birth year]      BSA:          1.54 m Patient Age:    89years        BP:           101/73 mmHg Patient Gender: F               HR:           82 bpm. 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 Risk Factors:Former Smoker. ETOH abuse.  Sonographer:    AClayton LefortRDCS (AE) Referring Phys: [email or web address] 1. Left ventricular ejection fraction, by visual estimation, is 50 to 55%. The left ventricle has normal function. There is no left ventricular hypertrophy.  2. The left ventricle has no regional wall motion abnormalities.  3. Global right ventricle has normal systolic function.The right ventricular size is normal. No increase in right ventricular wall thickness.  4. Left atrial size was normal.  5. Right atrial size was normal.  6. The mitral valve is normal in structure. No evidence of mitral valve regurgitation.  7. The tricuspid valve is normal in structure.  8. The aortic valve is normal in structure. Aortic valve regurgitation is not visualized.  9. The pulmonic valve was  normal in structure. Pulmonic valve regurgitation is not visualized. 10. Normal pulmonary artery systolic pressure. 11. The atrial septum is grossly normal. FINDINGS  Left Ventricle: Left ventricular ejection fraction, by visual estimation, is 50 to 55%. The left ventricle has normal function. The left ventricle has no regional wall motion abnormalities. There is no left ventricular hypertrophy. Right Ventricle: The right ventricular size is normal. No increase in right ventricular wall thickness. Global RV systolic function is has normal systolic function. The tricuspid regurgitant velocity is 1.91 m/s, and with an assumed right atrial pressure  of 3  mmHg, the estimated right ventricular systolic pressure is normal at 17.5 mmHg. Left Atrium: Left atrial size was normal in size. Right Atrium: Right atrial size was normal in size Pericardium: There is no evidence of pericardial effusion. Mitral Valve: The mitral valve is normal in structure. No evidence of mitral valve regurgitation. MV peak gradient, 2.0 mmHg. Tricuspid Valve: The tricuspid valve is normal in structure. Tricuspid valve regurgitation is trivial. Aortic Valve: The aortic valve is normal in structure. Aortic valve regurgitation is not visualized. Aortic valve mean gradient measures 2.0 mmHg. Aortic valve peak gradient measures 4.2 mmHg. Aortic valve area, by VTI measures 2.45 cm. Pulmonic Valve: The pulmonic valve was normal in structure. Pulmonic valve regurgitation is not visualized. Pulmonic regurgitation is not visualized. Aorta: The aortic root and ascending aorta are structurally normal, with no evidence of dilitation. IAS/Shunts: The atrial septum is grossly normal.  LEFT VENTRICLE PLAX 2D LVIDd:         3.70 cm  Diastology LVIDs:         2.50 cm  LV e' lateral:   10.00 cm/s LV PW:         0.90 cm  LV E/e' lateral: 5.6 LV IVS:        0.90 cm  LV e' medial:    7.29 cm/s LVOT diam:     1.90 cm  LV E/e' medial:  7.6 LV SV:         36 ml LV SV Index:   22.70 LVOT Area:     2.84 cm  RIGHT VENTRICLE             IVC RV Basal diam:  3.00 cm     IVC diam: 1.40 cm RV S prime:     10.00 cm/s TAPSE (M-mode): 2.0 cm LEFT ATRIUM             Index       RIGHT ATRIUM           Index LA diam:        2.70 cm 1.75 cm/m  RA Area:     10.00 cm LA Vol (A2C):   32.9 ml 21.35 ml/m RA Volume:   20.20 ml  13.11 ml/m LA Vol (A4C):   26.8 ml 17.39 ml/m LA Biplane Vol: 30.7 ml 19.92 ml/m  AORTIC VALVE AV Area (Vmax):    2.27 cm AV Area (Vmean):   2.15 cm AV Area (VTI):     2.45 cm AV Vmax:           102.00 cm/s AV Vmean:          72.700 cm/s AV VTI:            0.178 m AV Peak Grad:      4.2 mmHg AV Mean Grad:       2.0 mmHg LVOT Vmax:         81.80 cm/s  LVOT Vmean:        55.100 cm/s LVOT VTI:          0.154 m LVOT/AV VTI ratio: 0.87  AORTA Ao Root diam: 2.50 cm MITRAL VALVE                        TRICUSPID VALVE MV Area (PHT): 3.27 cm             TR Peak grad:   14.5 mmHg MV Peak grad:  2.0 mmHg             TR Vmax:        202.00 cm/s MV Mean grad:  1.0 mmHg MV Vmax:       0.71 m/s             SHUNTS MV Vmean:      45.8 cm/s            Systemic VTI:  0.15 m MV VTI:        0.18 m               Systemic Diam: 1.90 cm MV PHT:        67.28 msec MV Decel Time: 232 msec MV E velocity: 55.60 cm/s 103 cm/s MV A velocity: 53.70 cm/s 70.3 cm/s MV E/A ratio:  1.04       1.5  Mertie Moores MD Electronically signed by Mertie Moores MD Signature Date/Time: 04/30/2019/4:21:25 PM    Final    [place] EKG SITE RITE  Result Date: 05/14/2019 If Site Rite image not attached, placement could not be confirmed due to current cardiac rhythm.  US Abdomen Limited RUQ  Result Date: 04/30/2019 CLINICAL DATA:  Nausea and vomiting EXAM: ULTRASOUND ABDOMEN LIMITED RIGHT UPPER QUADRANT COMPARISON:  CT abdomen and pelvis April 30, 2019 FINDINGS: Gallbladder: Gallbladder appears mildly distended, similar to CT examination with sludge within the gallbladder. Inspissated sludge is noted posteriorly. No gallstones are evident. No gallbladder wall thickening or pericholecystic fluid. No sonographic Murphy sign noted by sonographer. Common bile duct: Diameter: 8 mm, prominent. No biliary duct mass or calculus evident. There appears to be mild intrahepatic biliary duct dilatation. Liver: There is a focal cystic area with septation in the left lobe of the liver measuring 1.6 x 1.4 x 1.0 cm. Liver echogenicity is diffusely increased. Portal vein is patent on color Doppler imaging with normal direction of blood flow towards the liver. Other: There is mild ascites adjacent to the liver. The pyramids of the right kidney appear rather prominent. No obstructing focus  in the right kidney. IMPRESSION: 1. Gallbladder distended with sludge present. No gallstones seen; small gallstones could be obscured by sludge. No gallbladder wall thickening or pericholecystic fluid. 2. Biliary duct dilatation. No mass or calculus is appreciable on this study in the biliary ductal system. From an imaging standpoint, MRCP would be the optimum imaging study of choice to further evaluate the biliary ductal system. 3. Complex cystic lesion in the left lobe of the liver measuring 1.6 x 1.4 x 1.0 cm. Underlying hepatic steatosis. 4.  Slight ascites. 5. Prominence of renal pyramids, a finding of uncertain significance. No right renal lesions seen on CT examination performed earlier in the day. Electronically Signed   By: Lowella Grip III M.D.   On: 04/30/2019 09:35       Subjective: Patient seen and examined at the bedside this morning.  Hemodynamically stable for discharge today.  Discharge Exam: Vitals:  05/17/19 2118 05/18/19 0505  BP: (!) 123/91 119/87  Pulse: (!) 106 (!) 107  Resp: 20 16  Temp: 98.1 F (36.7 C) 98.4 F (36.9 C)  SpO2: 100% 100%   Vitals:   05/17/19 0534 05/17/19 1448 05/17/19 2118 05/18/19 0505  BP: 104/69 109/76 (!) 123/91 119/87  Pulse: (!) 106 (!) 108 (!) 106 (!) 107  Resp: '16 17 20 16  '$ Temp: 98.6 F (37 C) 98.5 F (36.9 C) 98.1 F (36.7 C) 98.4 F (36.9 C)  TempSrc: Oral Oral Oral Oral  SpO2: 100% 100% 100% 100%  Weight: 52.6 kg   55.5 kg  Height:        General: Pt is alert, awake, not in acute distress, malnourished Cardiovascular: RRR, S1/S2 +, no rubs, no gallops Respiratory: CTA bilaterally, no wheezing, no rhonchi Abdominal: Soft, NT, ND, bowel sounds +, G-tube Extremities: no edema, no cyanosis    The results of significant diagnostics from this hospitalization (including imaging, microbiology, ancillary and laboratory) are listed below for reference.     Microbiology: Recent Results (from the past 240 hour(s))  Urine  culture     Status: Abnormal   Collection Time: 05/12/19  7:37 PM   Specimen: Urine, Random  Result Value Ref Range Status   Specimen Description   Final    URINE, RANDOM Performed at [facility] [address], [place], [place] [address]    Special Requests   Final    NONE Performed at [facility], [place] [address], [place], [place] [address]    Culture MULTIPLE SPECIES PRESENT, SUGGEST RECOLLECTION (A)  Final   Report Status 05/14/2019 FINAL  Final  SARS CORONAVIRUS 2 (TAT 6-24 HRS) Nasopharyngeal Nasopharyngeal Swab     Status: None   Collection Time: 05/12/19  9:31 PM   Specimen: Nasopharyngeal Swab  Result Value Ref Range Status   SARS Coronavirus 2 NEGATIVE NEGATIVE Final    Comment: (NOTE) SARS-CoV-2 target nucleic acids are NOT DETECTED. The SARS-CoV-2 RNA is generally detectable in upper and lower respiratory specimens during the acute phase of infection. Negative results do not preclude SARS-CoV-2 infection, do not rule out co-infections with other pathogens, and should not be used as the sole basis for treatment or other patient management decisions. Negative results must be combined with clinical observations, patient history, and epidemiological information. The expected result is Negative. Fact Sheet for Patients: SugarRoll.be Fact Sheet for Healthcare Providers: https://www.woods-mathews.com/ This test is not yet approved or cleared by the [place] FDA and  has been authorized for detection and/or diagnosis of SARS-CoV-2 by FDA under an Emergency Use Authorization (EUA). This EUA will remain  in effect (meaning this test can be used) for the duration of the COVID-19 declaration under Section 56 4(b)(1) of the Act, 21 U.S.C. section 360bbb-3(b)(1), unless the authorization is terminated or revoked sooner. Performed at [facility], [place] [address] [place],  [place] [address]      Labs: BNP (last 3 results) No results for input(s): BNP in the last 8760 hours. Basic Metabolic Panel: Recent Labs  Lab 05/12/19 1633 05/12/19 1708 05/13/19 0436 05/15/19 1630 05/16/19 0352 05/16/19 1729 05/17/19 0417  NA 140 140 140  --   --   --   --   K 4.3 4.3 4.6  --   --   --   --   CL 105 105 110  --   --   --   --   CO2 26  --  23  --   --   --   --   GLUCOSE 89 88 65*  --   --   --   --   BUN 23* 22* 21*  --   --   --   --   CREATININE 0.85 0.90 0.59  --   --   --   --   CALCIUM 8.9  --  8.1*  --   --   --   --   MG  --   --   --  1.4* 1.3* 2.4 2.1  PHOS  --   --   --  3.1 2.9 2.8 2.8   Liver Function Tests: Recent Labs  Lab 05/12/19 1633 05/13/19 0436  AST 24 23  ALT 29 23  ALKPHOS 154* 135*  BILITOT 0.9 0.2*  PROT 7.1 5.7*  ALBUMIN 3.1* 2.3*   No results for input(s): LIPASE, AMYLASE in the last 168 hours. No results for input(s): AMMONIA in the last 168 hours. CBC: Recent Labs  Lab 05/12/19 1633 05/12/19 1708 05/13/19 0436  WBC 6.8  --  7.6  HGB 12.2 12.6 10.7*  HCT 38.0 37.0 33.7*  MCV 101.3*  --  100.6*  PLT 221  --  238   Cardiac Enzymes: No results for input(s): CKTOTAL, CKMB, CKMBINDEX, TROPONINI in the last 168 hours. BNP: Invalid input(s): POCBNP CBG: Recent Labs  Lab 05/18/19 0112 05/18/19 0228 05/18/19 0449 05/18/19 0730 05/18/19 1026  GLUCAP 54* 128* 89 72 122*   D-Dimer No results for input(s): DDIMER in the last 72 hours. Hgb A1c No results for input(s): HGBA1C in the last 72 hours. Lipid Profile No results for input(s): CHOL, HDL, LDLCALC, TRIG, CHOLHDL, LDLDIRECT in the last 72 hours. Thyroid function studies No results for input(s): TSH, T4TOTAL, T3FREE, THYROIDAB in the last 72 hours.  Invalid input(s): FREET3 Anemia work up No results for input(s): VITAMINB12, FOLATE, FERRITIN, TIBC, IRON, RETICCTPCT in the last 72 hours. Urinalysis    Component Value Date/Time   COLORURINE YELLOW 05/12/2019  1937   APPEARANCEUR CLEAR 05/12/2019 1937   LABSPEC 1.005 05/12/2019 1937   PHURINE 6.0 05/12/2019 1937   GLUCOSEU >=500 (A) 05/12/2019 1937   HGBUR NEGATIVE 05/12/2019 1937   BILIRUBINUR NEGATIVE 05/12/2019 1937   KETONESUR NEGATIVE 05/12/2019 1937   PROTEINUR NEGATIVE 05/12/2019 1937   UROBILINOGEN 0.2 10/26/2012 1115   NITRITE NEGATIVE 05/12/2019 1937   LEUKOCYTESUR NEGATIVE 05/12/2019 1937   Sepsis Labs Invalid input(s): PROCALCITONIN,  WBC,  LACTICIDVEN Microbiology Recent Results (from the past 240 hour(s))  Urine culture     Status: Abnormal   Collection Time: 05/12/19  7:37 PM   Specimen: Urine, Random  Result Value Ref Range Status   Specimen Description   Final    URINE, RANDOM Performed at [facility], [place] [address], [place], [place] [address]    Special Requests   Final    NONE Performed at [facility], [place] [address] [place],  [address]    Culture MULTIPLE SPECIES PRESENT, SUGGEST RECOLLECTION (A)  Final   Report Status 05/14/2019 FINAL  Final  SARS CORONAVIRUS 2 (TAT 6-24 HRS) Nasopharyngeal Nasopharyngeal Swab     Status: None   Collection Time: 05/12/19  9:31 PM   Specimen: Nasopharyngeal Swab  Result Value Ref Range Status   SARS Coronavirus 2 NEGATIVE NEGATIVE Final    Comment: (NOTE) SARS-CoV-2 target nucleic acids are NOT DETECTED. The SARS-CoV-2 RNA is generally detectable in upper and  lower respiratory specimens during the acute phase of infection. Negative results do not preclude SARS-CoV-2 infection, do not rule out co-infections with other pathogens, and should not be used as the sole basis for treatment or other patient management decisions. Negative results must be combined with clinical observations, patient history, and epidemiological information. The expected result is Negative. Fact Sheet for Patients: SugarRoll.be Fact Sheet for Healthcare  Providers: https://www.woods-mathews.com/ This test is not yet approved or cleared by the [place] FDA and  has been authorized for detection and/or diagnosis of SARS-CoV-2 by FDA under an Emergency Use Authorization (EUA). This EUA will remain  in effect (meaning this test can be used) for the duration of the COVID-19 declaration under Section 56 4(b)(1) of the Act, 21 U.S.C. section 360bbb-3(b)(1), unless the authorization is terminated or revoked sooner. Performed at [facility], [place] [address] [place], [place] [address]     Please note: You were cared for by a hospitalist during your hospital stay. Once you are discharged, your primary care physician will handle any further medical issues. Please note that NO REFILLS for any discharge medications will be authorized once you are discharged, as it is imperative that you return to your primary care physician (or establish a relationship with a primary care physician if you do not have one) for your post hospital discharge needs so that they can reassess your need for medications and monitor your lab values.    Time coordinating discharge: 40 minutes  SIGNED:   Shelly Coss, MD  [facility] 05/18/2019, 10:53 AM Pager [phone]  If 7PM-7AM, please contact night-coverage [email or web address] Password [email or web address]

## 2019-05-18 NOTE — Progress Notes (Signed)
Pt's FS is 128 at this time.

## 2019-05-18 NOTE — Discharge Instructions (Addendum)
Preventing Hypoglycemia Hypoglycemia occurs when the level of sugar (glucose) in the blood is too low. Hypoglycemia can happen in people who do or do not have diabetes (diabetes mellitus). It can develop quickly, and it can be a medical emergency. For most people with diabetes, a blood glucose level below 70 mg/dL (3.9 mmol/L) is considered hypoglycemia. Glucose is a type of sugar that provides the body's main source of energy. Certain hormones (insulin and glucagon) control the level of glucose in the blood. Insulin lowers blood glucose, and glucagon increases blood glucose. Hypoglycemia can result from having too much insulin in the bloodstream, or from not eating enough food that contains glucose. Your risk for hypoglycemia is higher:  If you take insulin or diabetes medicines to help lower your blood glucose or help your body make more insulin.  If you skip or delay a meal or snack.  If you are ill.  During and after exercise. You can prevent hypoglycemia by working with your health care provider to adjust your meal plan as needed and by taking other precautions. How can hypoglycemia affect me? Mild symptoms Mild hypoglycemia may not cause any symptoms. If you do have symptoms, they may include:  Hunger.  Anxiety.  Sweating and feeling clammy.  Dizziness or feeling light-headed.  Sleepiness.  Nausea.  Increased heart rate.  Headache.  Blurry vision.  Irritability.  Tingling or numbness around the mouth, lips, or tongue.  A change in coordination.  Restless sleep. If mild hypoglycemia is not recognized and treated, it can quickly become moderate or severe hypoglycemia. Moderate symptoms Moderate hypoglycemia can cause:  Mental confusion and poor judgment.  Behavior changes.  Weakness.  Irregular heartbeat. Severe symptoms Severe hypoglycemia is a medical emergency. It can cause:  Fainting.  Seizures.  Loss of consciousness (coma).  Death. What  nutrition changes can be made?  Work with your health care provider or diet and nutrition specialist (dietitian) to make a healthy meal plan that is right for you. Follow your meal plan carefully.  Eat meals at regular times.  If recommended by your health care provider, have snacks between meals.  Donot skip or delay meals or snacks. You can be at risk for hypoglycemia if you are not getting enough carbohydrates. What lifestyle changes can be made?   Work closely with your health care provider to manage your blood glucose. Make sure you know: ? Your goal blood glucose levels. ? How and when to check your blood glucose. ? The symptoms of hypoglycemia. It is important to treat it right away to keep it from becoming severe.  Do not drink alcohol on an empty stomach.  When you are ill, check your blood glucose more often than usual. Follow your sick day plan whenever you cannot eat or drink normally. Make this plan in advance with your health care provider.  Always check your blood glucose before, during, and after exercise. How is this treated? This condition can often be treated by immediately eating or drinking something that contains sugar, such as:  Fruit juice, 4-6 oz (120-150 mL).  Regular (not diet) soda, 4-6 oz (120-150 mL).  Low-fat milk, 4 oz (120 mL).  Several pieces of hard candy.  Sugar or honey, 1 Tbsp (15 mL). Treating hypoglycemia if you have diabetes If you are alert and able to swallow safely, follow the 15:15 rule:  Take 15 grams of a rapid-acting carbohydrate. Talk with your health care provider about how much you should take.  Rapid-acting  options include: ? Glucose pills (take 15 grams). ? 6-8 pieces of hard candy. ? 4-6 oz (120-150 mL) of fruit juice. ? 4-6 oz (120-150 mL) of regular (not diet) soda.  Check your blood glucose 15 minutes after you take the carbohydrate.  If the repeat blood glucose level is still at or below 70 mg/dL (3.9 mmol/L),  take 15 grams of a carbohydrate again.  If your blood glucose level does not increase above 70 mg/dL (3.9 mmol/L) after 3 tries, seek emergency medical care.  After your blood glucose level returns to normal, eat a meal or a snack within 1 hour. Treating severe hypoglycemia Severe hypoglycemia is when your blood glucose level is at or below 54 mg/dL (3 mmol/L). Severe hypoglycemia is a medical emergency. Get medical help right away. If you have severe hypoglycemia and you cannot eat or drink, you may need an injection of glucagon. A family member or close friend should learn how to check your blood glucose and how to give you a glucagon injection. Ask your health care provider if you need to have an emergency glucagon injection kit available. Severe hypoglycemia may need to be treated in a hospital. The treatment may include getting glucose through an IV. You may also need treatment for the cause of your hypoglycemia. Where to find more information  American Diabetes Association: www.diabetes.[employer] of Diabetes and Digestive and Kidney Diseases: [employer] Contact a health care provider if:  You have problems keeping your blood glucose in your target range.  You have frequent episodes of hypoglycemia. Get help right away if:  You continue to have hypoglycemia symptoms after eating or drinking something containing glucose.  Your blood glucose level is at or below 54 mg/dL (3 mmol/L).  You faint.  You have a seizure. These symptoms may represent a serious problem that is an emergency. Do not wait to see if the symptoms will go away. Get medical help right away. Call your local emergency services (911 in the [place] Summary  Know the symptoms of hypoglycemia, and when you are at risk for it (such as during exercise or when you are sick). Check your blood glucose often when you are at risk for hypoglycemia.  Hypoglycemia can develop quickly, and it can be dangerous  if it is not treated right away. If you have a history of severe hypoglycemia, make sure you know how to use your glucagon injection kit.  Make sure you know how to treat hypoglycemia. Keep a carbohydrate snack available when you may be at risk for hypoglycemia. This information is not intended to replace advice given to you by your health care provider. Make sure you discuss any questions you have with your health care provider. Document Revised: 08/04/2018 Document Reviewed: 12/08/2016 Elsevier Patient Education  [place].   Hypoglycemia Hypoglycemia occurs when the level of sugar (glucose) in the blood is too low. Hypoglycemia can happen in people who do or do not have diabetes. It can develop quickly, and it can be a medical emergency. For most people with diabetes, a blood glucose level below 70 mg/dL (3.9 mmol/L) is considered hypoglycemia. Glucose is a type of sugar that provides the body's main source of energy. Certain hormones (insulin and glucagon) control the level of glucose in the blood. Insulin lowers blood glucose, and glucagon raises blood glucose. Hypoglycemia can result from having too much insulin in the bloodstream, or from not eating enough food that contains glucose. You may also have reactive  hypoglycemia, which happens within 4 hours after eating a meal. What are the causes? Hypoglycemia occurs most often in people who have diabetes and may be caused by:  Diabetes medicine.  Not eating enough, or not eating often enough.  Increased physical activity.  Drinking alcohol on an empty stomach. If you do not have diabetes, hypoglycemia may be caused by:  A tumor in the pancreas.  Not eating enough, or not eating for long periods at a time (fasting).  A severe infection or illness.  Certain medicines. What increases the risk? Hypoglycemia is more likely to develop in:  People who have diabetes and take medicines to lower blood glucose.  People who abuse  alcohol.  People who have a severe illness. What are the signs or symptoms? Mild symptoms Mild hypoglycemia may not cause any symptoms. If you do have symptoms, they may include:  Hunger.  Anxiety.  Sweating and feeling clammy.  Dizziness or feeling light-headed.  Sleepiness.  Nausea.  Increased heart rate.  Headache.  Blurry vision.  Irritability.  Tingling or numbness around the mouth, lips, or tongue.  A change in coordination.  Restless sleep. Moderate symptoms Moderate hypoglycemia can cause:  Mental confusion and poor judgment.  Behavior changes.  Weakness.  Irregular heartbeat. Severe symptoms Severe hypoglycemia is a medical emergency. It can cause:  Fainting.  Seizures.  Loss of consciousness (coma).  Death. How is this diagnosed? Hypoglycemia is diagnosed with a blood test to measure your blood glucose level. This blood test is done while you are having symptoms. Your health care provider may also do a physical exam and review your medical history. How is this treated? This condition can often be treated by immediately eating or drinking something that contains sugar, such as:  Fruit juice, 4-6 oz (120-150 mL).  Regular soda (not diet soda), 4-6 oz (120-150 mL).  Low-fat milk, 4 oz (120 mL).  Several pieces of hard candy.  Sugar or honey, 1 Tbsp (15 mL). Treating hypoglycemia if you have diabetes If you are alert and able to swallow safely, follow the 15:15 rule:  Take 15 grams of a rapid-acting carbohydrate. Talk with your health care provider about how much you should take.  Rapid-acting options include: ? Glucose pills (take 15 grams). ? 6-8 pieces of hard candy. ? 4-6 oz (120-150 mL) of fruit juice. ? 4-6 oz (120-150 mL) of regular (not diet) soda. ? 1 Tbsp (15 mL) honey or sugar.  Check your blood glucose 15 minutes after you take the carbohydrate.  If the repeat blood glucose level is still at or below 70 mg/dL (3.9  mmol/L), take 15 grams of a carbohydrate again.  If your blood glucose level does not increase above 70 mg/dL (3.9 mmol/L) after 3 tries, seek emergency medical care.  After your blood glucose level returns to normal, eat a meal or a snack within 1 hour.  Treating severe hypoglycemia Severe hypoglycemia is when your blood glucose level is at or below 54 mg/dL (3 mmol/L). Severe hypoglycemia is a medical emergency. Get medical help right away. If you have severe hypoglycemia and you cannot eat or drink, you may need an injection of glucagon. A family member or close friend should learn how to check your blood glucose and how to give you a glucagon injection. Ask your health care provider if you need to have an emergency glucagon injection kit available. Severe hypoglycemia may need to be treated in a hospital. The treatment may include getting glucose through  an IV. You may also need treatment for the cause of your hypoglycemia. Follow these instructions at home:  General instructions  Take over-the-counter and prescription medicines only as told by your health care provider.  Monitor your blood glucose as told by your health care provider.  Limit alcohol intake to no more than 1 drink a day for nonpregnant women and 2 drinks a day for men. One drink equals 12 oz of beer (355 mL), 5 oz of wine (148 mL), or 1 oz of hard liquor (44 mL).  Keep all follow-up visits as told by your health care provider. This is important. If you have diabetes:  Always have a rapid-acting carbohydrate snack with you to treat low blood glucose.  Follow your diabetes management plan as directed. Make sure you: ? Know the symptoms of hypoglycemia. It is important to treat it right away to prevent it from becoming severe. ? Take your medicines as directed. ? Follow your exercise plan. ? Follow your meal plan. Eat on time, and do not skip meals. ? Check your blood glucose as often as directed. Always check before  and after exercise. ? Follow your sick day plan whenever you cannot eat or drink normally. Make this plan in advance with your health care provider.  Share your diabetes management plan with people in your workplace, school, and household.  Check your urine for ketones when you are ill and as told by your health care provider.  Carry a medical alert card or wear medical alert jewelry. Contact a health care provider if:  You have problems keeping your blood glucose in your target range.  You have frequent episodes of hypoglycemia. Get help right away if:  You continue to have hypoglycemia symptoms after eating or drinking something containing glucose.  Your blood glucose is at or below 54 mg/dL (3 mmol/L).  You have a seizure.  You faint. These symptoms may represent a serious problem that is an emergency. Do not wait to see if the symptoms will go away. Get medical help right away. Call your local emergency services (911 in the [place] Summary  Hypoglycemia occurs when the level of sugar (glucose) in the blood is too low.  Hypoglycemia can happen in people who do or do not have diabetes. It can develop quickly, and it can be a medical emergency.  Make sure you know the symptoms of hypoglycemia and how to treat it.  Always have a rapid-acting carbohydrate snack with you to treat low blood sugar. This information is not intended to replace advice given to you by your health care provider. Make sure you discuss any questions you have with your health care provider. Document Revised: 10/03/2017 Document Reviewed: 05/16/2015 Elsevier Patient Education  2020 [employer]. How to Care for a Feeding Tube  A feeding tube is a tube used to give medicine, water, and liquid food. A person may have this tube if she or he has trouble swallowing or cannot take food or medicine. Supplies needed to care for the tube site:  Clean gloves.  Clean washcloth, gauze pads, or soft paper  towel.  Cotton swabs.  A skin barrier ointment or cream, such as petroleum jelly.  Soap and water.  Pre-cut foam pads or gauze (for around the tube).  Tube tape.  An anchoring device (optional). How to care for the tube site  1. Have all supplies ready and close to you. 2. Wash your hands. 3. Put on gloves. 4. Change any pad or  gauze near the tube if: ? It is dirty. ? It is wet. ? It has been there for more than one day. 5. Check the skin around the tube. Call the doctor if you see any of these: ? Red skin. ? A rash. ? Swelling. ? Leaking fluid. ? Extra skin. 6. Dip the gauze and cotton swabs in water and soap. 7. Use the cotton swabs to wipe the skin that is closest to the tube. 8. Use the gauze pads to wipe the rest of the skin near the tube. 9. Rinse with water. 10. Dry the area with a clean washcloth, dry gauze pad, or soft paper towel. 11. If the skin is red, use a cotton swab to put on a skin barrier cream or ointment. Put it on by making little circles. Do not apply antibiotic ointments at the tube site. 12. Put a new pre-cut foam pad or gauze around the tube. If there is no fluid at the tube site, you do not need a pad or gauze. 13. Tape down the edges. 14. Use tape or an anchoring device to attach the tube to the skin. Do this for comfort or as told. Each time you use tape, put it in a different place. 15. Sit the person up. 16. Throw away used supplies. 17. Take off your gloves. 18. Wash your hands. Supplies needed to flush a feeding tube:  Clean gloves.  A clean 60 mL syringe that connects to the feeding tube.  A towel.  Germ-free (sterile) or purified water. Follow these rules: ? Use germ-free water if:  Your body's defense system (immune system) is weak and you have trouble getting better from infections (are immunocompromised).  You do not know how many chemicals are in your water. ? Do not use water from lakes or other bodies of water unless you  treat it or filter it first. ? To make drinking water pure by boiling:  Boil water for 1 minute or longer. Keep a lid over the water while it boils.  Let the water cool off to room temperature before you use it. How to flush a feeding tube 1. Have all supplies ready and close to you. 2. Wash your hands. 3. Put on gloves. 4. Pull 30 mL of water into the syringe. 5. Before you push water into (flush) the tube, put the towel under the tube to catch any fluid leaks. 6. Bend (kink) the feeding tube while you do one of these things: ? Disconnect it from the feeding-bag tubing. ? Take off the cap at the end of the tube. 7. Put the tip of the syringe into the end of the feeding tube. 8. Stop bending the tube. 9. Use the syringe to slowly put the water in the tube. If the water will not go in the tube: ? Have the person lie on his or her left side. ? Try putting the water in the tube again. ? Do not push hard to make the water go in. 10. Take out the syringe and put the cap on the tube. 11. Throw away used supplies. 12. Take off your gloves. 13. Wash your hands. Follow these instructions at home: Caring for the tube  If the person has a foam pad or gauze near the tube, change it: ? Every day. ? When it is dirty. ? When it is wet.  Do not put antibiotic ointments by the tube. Flushing the tube  Do not use a syringe that is smaller  than 60 mL.  Flush the tube at all of these times: ? Before you give medicine. ? Between medicines. ? After the person gets the last medicine before a feeding.  Do not mix medicines with formula. Do not mix medicines with other medicines.  Completely flush medicines through the tube. That way, they will not mix with formula. Contact a doctor if:  The tube gets blocked or clogged.  You find any of these on the skin around the tube site: ? Red skin. ? A rash. ? Swelling. ? Leaking fluid. ? Extra skin. Summary  A feeding tube is a tube used to  give medicine, water, and liquid food. A person may have this tube if she or he has trouble swallowing or cannot take food or medicine.  Follow the doctor's instructions to care for the tube site and flush the tube every day.  Contact a doctor if the tube gets blocked or clogged. This information is not intended to replace advice given to you by your health care provider. Make sure you discuss any questions you have with your health care provider. Document Revised: 03/25/2017 Document Reviewed: 05/21/2016 Elsevier Patient Education  2020 [employer].   How to Give a Feeding Through a Feeding Tube A feeding tube is a soft, flexible tube through which medicine, water, and liquid food (formula or breast milk) can be given. A person may have a feeding tube if he or she has trouble swallowing or cannot have food or medicine by mouth. A health care provider may also give you more specific instructions. If you have problems or questions, contact a health care provider. Supplies needed:  Prescribed formula. Breast milk may be used for an infant.  Feeding bag set, gravity drip tubing set, or 30-60 mL syringe with feeding extension tubing.  Feeding tube pump or syringe pump as needed.  Pole to hang feeding.  20-60 mL syringe to check tube placement.  A syringe to flush the feeding tube.  Sterile or purified water. Follow these guidelines: ? Use sterile water if the person has a weak immune system and has difficulty fighting off infections (is immunocompromised), or if you are unsure about the amount of chemical contaminants in purified or drinking water. ? Purify drinking water by boiling it for at least 1 minute. Keep a lid over the water while it boils. Allow the water to cool to room temperature before using it. How to give a feeding through a feeding tube pump  19. Have all supplies ready and available. 10. Wash your hands with soap and water. 21. Check the placement of the feeding tube  as directed. 22. Raise the head of the person 30-45, or as directed. 23. Pour the prescribed amount of formula into the feeding bag set. [place] the feeding bag set from a pole. Formula that is prepared in a sterile way can hang for up to 8 hours. For a newborn, hang time should be limited to 4 hours. 25. Prime the entire feeding bag set with the formula. To do this, allow the liquid to travel through the entire set to the end of the tubing. 26. Cap the feeding bag set until after the feeding tube has been flushed. 27. Load the feeding bag set into the feeding tube pump. 28. Clamp or kink the feeding tube before removing the cap and as you are disconnecting syringes and feeding tubing. 48. Uncap the end of the feeding tube. 30. Use a syringe to flush the feeding  tube with purified or sterile water as directed. 31. Uncap the feeding bag set. 32. Connect the feeding bag set to the feeding tube. 33. Set the prescribed feeding rate on the feeding tube pump. 34. Start the feeding tube pump. How to give a feeding through a syringe pump 1. Wash your hands with soap and water. 2. Have all supplies ready and available. 3. Check the placement of the feeding tube as directed. 4. Raise the head of the person 30-45, or as directed. 5. Draw up prescribed amount of formula into the correctly sized syringe. 6. Attach the syringe to the feeding extension tubing. 7. Prime the extension tubing by flushing the entire feeding extension tubing with the feeding liquid. 8. Load the syringe and feeding extension tubing into the syringe pump. 9. Cap the feeding extension tubing. 10. Clamp or kink the feeding tube before removing the cap and as you are disconnecting syringes and feeding tubing. 11. Uncap the end of the feeding tube. 12. Use a syringe to flush the feeding tube with purified or sterile water as directed. 13. Uncap the feeding extension tubing. 14. Connect the feeding extension tubing to the  feeding tube. 15. Set the prescribed feeding rate. 16. Start the syringe pump. How to give a feeding using the gravity method 14. Wash your hands with soap and water. 15. Have all supplies ready and available. 16. Check the placement of the feeding tube as directed. 17. Raise the head of the person 30-45, or as directed. 18. Close the clamp on the gravity drip tubing set. 19. Pour the prescribed amount of formula into the bag of a gravity drip tubing set. Or, if using a ready-to-hang formula container, connect the gravity drip tubing set to the ready-to-hang container. 58. Hang the feeding with the attached gravity drip tubing set from a pole. Formula that is prepared in a sterile way can hang for up to 8 hours. For a newborn, hang time should be limited to 4 hours. 21. Open the roller clamp on the gravity drip tubing to prime the entire tubing with the feeding. 22. Close the roller clamp. 23. Cap the gravity drip tubing. 24. Clamp or kink the feeding tube before removing the cap and as you are disconnecting syringes and feeding tubing. 25. Uncap the feeding tube. 26. Use a syringe to flush the feeding tube with purified or sterile water as directed. 27. Uncap the gravity drip tubing. 28. Connect the gravity drip tubing to the feeding tube. 29. Using the roller clamp, adjust the feeding rate to deliver the feeding at the prescribed rate. How to give a bolus feeding through a feeding tube 1. Remove the plunger from the syringe. 2. Attach the syringe to the feeding tube. 3. Pour the amount of formula needed into the syringe. 4. Administer the feeding at the prescribed rate by means of gravity. 5. Clamp or kink the feeding tube before removing the cap or disconnecting the syringe. A feeding bag may also be used for bolus feedings, depending on the volume of feeding given. Contact a health care provider if:  You are having trouble doing a tube feeding.  You are not sure what type of  formula to give.  The feeding tube is clogged, falls out, or does not work.  The person who is getting the feeding has unusual weight loss or weight gain. Get help right away if:  The skin area around the feeding tube is red, swollen, warm to the touch, or tender.  There is drainage coming from the area around the feeding tube.  The drainage coming from the area around the feeding tube has a bad smell.  The person who is getting the feeding develops any of these problems: ? Vomiting or nausea. ? Fever. ? Constipation or diarrhea. ? A large, bloated stomach. Summary  A person may have a feeding tube if he or she has trouble swallowing or cannot have food or medicine by mouth.  You can give formula or breast milk through the tube.  Have all of your supplies ready and available before giving a feeding.  If you have problems or questions, contact a health care provider. This information is not intended to replace advice given to you by your health care provider. Make sure you discuss any questions you have with your health care provider. Document Revised: 11/16/2018 Document Reviewed: 05/25/2016 Elsevier Patient Education  [place].

## 2019-05-18 NOTE — Progress Notes (Signed)
Pt's FS is 54 . Pt alert and responsive. Denies any discomfort. Orange juice with 2 pack of sugar given. MD was paged, awaiting for further instructions/orders.

## 2019-05-18 NOTE — Progress Notes (Signed)
Patient ID: Angie [name], female   DOB: 04/24/1966, 54 y.o.   MRN: [id number] Saw the patient again today regarding her g-tube.  It is only leaking when she gets her bolus feeds.  However, she stated today that she has gained almost 17lbs and is having a much better appetite and eating well.  We discussed just leaving her g-tube clamped and just taking in oral nutrition.  She really wants to use her g-tube for supplements since she has it.  This is reasonable.  I discussed with her that if she continues the bolus feeds at home that she will just have to be aware that her tube is likely going to leak some.  She understood and after this discussion was comfortable with this.  We discussed barrier cream and frequent dressing changes to keep the skin dry.  She understood and liked this plan.  She has close follow up with Dr. Kieth Brightly in our office this month.  Henreitta Cea 10:46 AM 05/18/2019

## 2019-06-14 ENCOUNTER — Encounter: Payer: Self-pay | Admitting: *Deleted

## 2019-06-21 ENCOUNTER — Ambulatory Visit: Payer: Self-pay | Admitting: *Deleted

## 2019-06-21 NOTE — Telephone Encounter (Signed)
Pt reports BS this AM 237. Rechecked 30" later, 37. States ate pasta and orange juice, up to 116, 20 minutes later 64. Pt reports in ED and admitted 3 weeks ago when BS 23 with LOC at that time. Pt is not on insulin or oral DM meds. Pt states feet and toes numb.Advised ED/UC. States boyfriend will drive. Advised is loc occurs, pull over and call 911. Pt verbalizes understanding.  Reason for Disposition . [1] Low blood glucose (< 70 mg/dL  or 3.9 mmol/L) persists > 30 minutes AND [2] using low blood sugar Care Advice  Answer Assessment - Initial Assessment Questions 1. SYMPTOMS: "What symptoms are you concerned about?"     BS fluctuating. 2. ONSET:  "When did the symptoms start?"     This am 3. BLOOD GLUCOSE: "What is your blood glucose level?"      64 4. USUAL RANGE: "What is your blood glucose level usually?" (e.g., usual fasting morning value, usual evening value)     130's 140's 5. TYPE 1 or 2:  "Do you know what type of diabetes you have?"  (e.g., Type 1, Type 2, Gestational; doesn't know)      Type 2 6. INSULIN: "Do you take insulin?" "What type of insulin(s) do you use? What is the mode of delivery? (syringe, pen; injection or pump) "When did you last give yourself an insulin dose?" (i.e., time or hours/minutes ago) "How much did you give?" (i.e., how many units)     no 7. DIABETES PILLS: "Do you take any pills for your diabetes?"     no 8. OTHER SYMPTOMS: "Do you have any symptoms?" (e.g., fever, frequent urination, difficulty breathing, vomiting)     Feet and toes numb 9. LOW BLOOD GLUCOSE TREATMENT: "What have you done so far to treat the low blood glucose level?"     Pasta and orange juice 10. FOOD: "When did you last eat or drink?"       Few minutes prior to call 11. ALONE: "Are you alone right now or is someone with you?"        With BF  Protocols used: DIABETES - LOW BLOOD SUGAR-A-AH

## 2019-07-17 ENCOUNTER — Ambulatory Visit: Payer: Self-pay | Admitting: General Surgery

## 2019-08-03 ENCOUNTER — Encounter (HOSPITAL_COMMUNITY): Payer: Self-pay

## 2019-08-03 NOTE — Patient Instructions (Addendum)
DUE TO COVID-19 ONLY TWO VISITORS ARE ALLOWED TO COME WITH YOU AND STAY IN THE WAITING ROOM ONLY DURING PRE OP AND PROCEDURE. THE TWO VISITORS MAY VISIT WITH YOU IN YOUR PRIVATE ROOM DURING VISITING HOURS ONLY!!   COVID SWAB TESTING MUST BE COMPLETED ON: Thursday, August 09, 2019 [facility]  [facility] pre [facility] (Must self quarantine after testing. Follow instructions on handout.)             Your procedure is scheduled on: Monday, August 13, 2019   Report to [facility] Main  Entrance   Report to Short Stay at 5:30 AM   [facility])   Call this number if you have problems the morning of surgery [phone]    NO SOLID FOOD AFTER 600 PM THE NIGHT BEFORE YOUR SURGERY.    YOU MAY DRINK CLEAR FLUIDS UNTIL 4:15 AM DAY OF SURGERY   CLEAR LIQUID DIET  Foods Allowed                                                                     Foods Excluded  Water, Black Coffee and tea, regular and decaf                             liquids that you cannot  Plain Jell-O in any flavor  (No red)                                           see through such as: Fruit ices (not with fruit pulp)                                     milk, soups, orange juice  Iced Popsicles (No red)                                    All solid food Carbonated beverages, regular and diet                                    Apple juices Sports drinks like Gatorade (No red) Lightly seasoned clear broth or consume(fat free) Sugar, honey syrup  Sample Menu Breakfast                                Lunch                                     Supper Cranberry juice                    Beef broth  Chicken broth Jell-O                                     Grape juice                           Apple juice Coffee or tea                        Jell-O                                      Popsicle                                                 Coffee or tea                        Coffee or tea   MORNING OF SURGERY DRINK:   DRINK 1 G2 drink BEFORE YOU LEAVE HOME, DRINK ALL OF THE  G2 DRINK AT ONE TIME.     THE G2 DRINK WILL BE THE LAST FLUIDS YOU DRINK BEFORE SURGERY.   Oral Hygiene is also important to reduce your risk of infection.                                    Remember - BRUSH YOUR TEETH THE MORNING OF SURGERY WITH YOUR REGULAR TOOTHPASTE   Do NOT smoke after Midnight   Take these medicines the morning of surgery with A SIP OF WATER: Alprazolam, Oxycodone                               You may not have any metal on your body including hair pins, jewelry, and body piercings             Do not wear make-up, lotions, powders, perfumes/cologne, or deodorant             Do not wear nail polish.  Do not shave  48 hours prior to surgery.               Contacts, dentures or bridgework may not be worn into surgery.   PAIN IS EXPECTED AFTER SURGERY AND WILL NOT BE COMPLETELY ELIMINATED. AMBULATION AND TYLENOL WILL HELP REDUCE INCISIONAL AND GAS PAIN. MOVEMENT IS KEY!    YOU ARE EXPECTED TO BE OUT OF BED WITHIN 4 HOURS OF ADMISSION TO YOUR PATIENT ROOM.   SITTING IN THE RECLINER THROUGHOUT THE DAY IS IMPORTANT FOR DRINKING FLUIDS AND MOVING GAS THROUGHOUT THE GI TRACT.   COMPRESSION STOCKINGS SHOULD BE WORN [place] UNLESS YOU ARE WALKING.    INCENTIVE SPIROMETER SHOULD BE USED EVERY HOUR WHILE AWAKE TO DECREASE POST-OPERATIVE COMPLICATIONS SUCH AS PNEUMONIA.   WHEN DISCHARGED HOME, IT IS IMPORTANT TO CONTINUE TO WALK EVERY HOUR AND USE THE INCENTIVE SPIROMETER EVERY HOUR.    Bring small overnight bag day of surgery.    Patients discharged the day of surgery will not be allowed  to drive home.   Name and phone number of your driver:   Special Instructions: Bring a copy of your healthcare power of attorney and living will documents         the day of surgery if you haven't scanned them in  before.   Do not bring valuables to the hospital. [place].              Please read over the following fact sheets you were given: IF YOU HAVE QUESTIONS ABOUT YOUR PRE OP INSTRUCTIONS PLEASE CALL [phone]  [facility] - Preparing for Surgery Before surgery, you can play an important role.  Because skin is not sterile, your skin needs to be as free of germs as possible.  You can reduce the number of germs on your skin by washing with CHG (chlorahexidine gluconate) soap before surgery.  CHG is an antiseptic cleaner which kills germs and bonds with the skin to continue killing germs even after washing. Please DO NOT use if you have an allergy to CHG or antibacterial soaps.  If your skin becomes reddened/irritated stop using the CHG and inform your nurse when you arrive at Short Stay. Do not shave (including legs and underarms) for at least 48 hours prior to the first CHG shower.  You may shave your face/neck.  Please follow these instructions carefully:  1.  Shower with CHG Soap the night before surgery and the  morning of surgery.  2.  If you choose to wash your hair, wash your hair first as usual with your normal  shampoo.  3.  After you shampoo, rinse your hair and body thoroughly to remove the shampoo.                             4.  Use CHG as you would any other liquid soap.  You can apply chg directly to the skin and wash.  Gently with a scrungie or clean washcloth.  5.  Apply the CHG Soap to your body ONLY FROM THE NECK DOWN.   Do   not use on face/ open                           Wound or open sores. Avoid contact with eyes, ears mouth and   genitals (private parts).                       Wash face,  Genitals (private parts) with your normal soap.             6.  Wash thoroughly, paying special attention to the area where your    surgery  will be performed.  7.  Thoroughly rinse your body with warm water from the neck down.  8.  DO NOT  shower/wash with your normal soap after using and rinsing off the CHG Soap.                9.  Pat yourself dry with a clean towel.            10.  Wear clean pajamas.            11.  Place clean sheets on your bed the night of your first shower and do not  sleep with pets. Day  of Surgery : Do not apply any lotions/deodorants the morning of surgery.  Please wear clean clothes to the hospital/surgery center.  FAILURE TO FOLLOW THESE INSTRUCTIONS MAY RESULT IN THE CANCELLATION OF YOUR SURGERY  PATIENT SIGNATURE_________________________________  NURSE SIGNATURE__________________________________  ________________________________________________________________________    [name]  An incentive spirometer is a tool that can help keep your lungs clear and active. This tool measures how well you are filling your lungs with each breath. Taking long deep breaths may help reverse or decrease the chance of developing breathing (pulmonary) problems (especially infection) following:  A long period of time when you are unable to move or be active. BEFORE THE PROCEDURE   If the spirometer includes an indicator to show your best effort, your nurse or respiratory therapist will set it to a desired goal.  If possible, sit up straight or lean slightly forward. Try not to slouch.  Hold the incentive spirometer in an upright position. INSTRUCTIONS FOR USE  1. Sit on the edge of your bed if possible, or sit up as far as you can in bed or on a chair. 2. Hold the incentive spirometer in an upright position. 3. Breathe out normally. 4. Place the mouthpiece in your mouth and seal your lips tightly around it. 5. Breathe in slowly and as deeply as possible, raising the piston or the ball toward the top of the column. 6. Hold your breath for 3-5 seconds or for as long as possible. Allow the piston or ball to fall to the bottom of the column. 7. Remove the mouthpiece from your mouth and breathe out  normally. 8. Rest for a few seconds and repeat Steps 1 through 7 at least 10 times every 1-2 hours when you are awake. Take your time and take a few normal breaths between deep breaths. 9. The spirometer may include an indicator to show your best effort. Use the indicator as a goal to work toward during each repetition. 10. After each set of 10 deep breaths, practice coughing to be sure your lungs are clear. If you have an incision (the cut made at the time of surgery), support your incision when coughing by placing a pillow or rolled up towels firmly against it. Once you are able to get out of bed, walk around indoors and cough well. You may stop using the incentive spirometer when instructed by your caregiver.  RISKS AND COMPLICATIONS  Take your time so you do not get dizzy or light-headed.  If you are in pain, you may need to take or ask for pain medication before doing incentive spirometry. It is harder to take a deep breath if you are having pain. AFTER USE  Rest and breathe slowly and easily.  It can be helpful to keep track of a log of your progress. Your caregiver can provide you with a simple table to help with this. If you are using the spirometer at home, follow these instructions: [place] IF:   You are having difficultly using the spirometer.  You have trouble using the spirometer as often as instructed.  Your pain medication is not giving enough relief while using the spirometer.  You develop fever of 100.5 F (38.1 C) or higher. SEEK IMMEDIATE MEDICAL CARE IF:   You cough up bloody sputum that had not been present before.  You develop fever of 102 F (38.9 C) or greater.  You develop worsening pain at or near the incision site. MAKE SURE YOU:   Understand these instructions.  Will watch your condition.  Will get help right away if you are not doing well or get worse. Document Released: 08/23/2006 Document Revised: 07/05/2011 Document Reviewed:  10/24/2006 ExitCare Patient Information 2014 ExitCare, [place].   ________________________________________________________________________  WHAT IS A BLOOD TRANSFUSION? Blood Transfusion Information  A transfusion is the replacement of blood or some of its parts. Blood is made up of multiple cells which provide different functions.  Red blood cells carry oxygen and are used for blood loss replacement.  White blood cells fight against infection.  Platelets control bleeding.  Plasma helps clot blood.  Other blood products are available for specialized needs, such as hemophilia or other clotting disorders. BEFORE THE TRANSFUSION  Who gives blood for transfusions?   Healthy volunteers who are fully evaluated to make sure their blood is safe. This is blood bank blood. Transfusion therapy is the safest it has ever been in the practice of medicine. Before blood is taken from a donor, a complete history is taken to make sure that person has no history of diseases nor engages in risky social behavior (examples are intravenous drug use or sexual activity with multiple partners). The donor's travel history is screened to minimize risk of transmitting infections, such as malaria. The donated blood is tested for signs of infectious diseases, such as HIV and hepatitis. The blood is then tested to be sure it is compatible with you in order to minimize the chance of a transfusion reaction. If you or a relative donates blood, this is often done in anticipation of surgery and is not appropriate for emergency situations. It takes many days to process the donated blood. RISKS AND COMPLICATIONS Although transfusion therapy is very safe and saves many lives, the main dangers of transfusion include:   Getting an infectious disease.  Developing a transfusion reaction. This is an allergic reaction to something in the blood you were given. Every precaution is taken to prevent this. The decision to have a blood  transfusion has been considered carefully by your caregiver before blood is given. Blood is not given unless the benefits outweigh the risks. AFTER THE TRANSFUSION  Right after receiving a blood transfusion, you will usually feel much better and more energetic. This is especially true if your red blood cells have gotten low (anemic). The transfusion raises the level of the red blood cells which carry oxygen, and this usually causes an energy increase.  The nurse administering the transfusion will monitor you carefully for complications. HOME CARE INSTRUCTIONS  No special instructions are needed after a transfusion. You may find your energy is better. Speak with your caregiver about any limitations on activity for underlying diseases you may have. SEEK MEDICAL CARE IF:   Your condition is not improving after your transfusion.  You develop redness or irritation at the intravenous (IV) site. SEEK IMMEDIATE MEDICAL CARE IF:  Any of the following symptoms occur over the next 12 hours:  Shaking chills.  You have a temperature by mouth above 102 F (38.9 C), not controlled by medicine.  Chest, back, or muscle pain.  People around you feel you are not acting correctly or are confused.  Shortness of breath or difficulty breathing.  Dizziness and fainting.  You get a rash or develop hives.  You have a decrease in urine output.  Your urine turns a dark color or changes to pink, red, or brown. Any of the following symptoms occur over the next 10 days:  You have a temperature by mouth above 102  F (38.9 C), not controlled by medicine.  Shortness of breath.  Weakness after normal activity.  The white part of the eye turns yellow (jaundice).  You have a decrease in the amount of urine or are urinating less often.  Your urine turns a dark color or changes to pink, red, or brown. Document Released: 04/09/2000 Document Revised: 07/05/2011 Document Reviewed: 11/27/2007 [facility] Patient  Information 2014 [place], [place].  _______________________________________________________________________

## 2019-08-06 ENCOUNTER — Other Ambulatory Visit: Payer: Self-pay

## 2019-08-06 ENCOUNTER — Encounter (HOSPITAL_COMMUNITY)
Admission: RE | Admit: 2019-08-06 | Discharge: 2019-08-06 | Disposition: A | Discharge status: Home / Self Care | Payer: Medicare Other | Source: Ambulatory Visit | Attending: General Surgery | Admitting: General Surgery

## 2019-08-06 ENCOUNTER — Encounter (HOSPITAL_COMMUNITY): Payer: Self-pay

## 2019-08-06 DIAGNOSIS — Z01812 Encounter for preprocedural laboratory examination: Secondary | ICD-10-CM | POA: Insufficient documentation

## 2019-08-06 HISTORY — DX: Hypoglycemia, unspecified: E16.2

## 2019-08-06 HISTORY — DX: Localized edema: R60.0

## 2019-08-06 HISTORY — DX: Gastrostomy status: Z93.1

## 2019-08-06 LAB — COMPREHENSIVE METABOLIC PANEL
ALT: 9 U/L (ref 0–44)
AST: 14 U/L — ABNORMAL LOW (ref 15–41)
Albumin: 3.5 g/dL (ref 3.5–5.0)
Alkaline Phosphatase: 80 U/L (ref 38–126)
Anion gap: 7 (ref 5–15)
BUN: 12 mg/dL (ref 6–20)
CO2: 24 mmol/L (ref 22–32)
Calcium: 8.6 mg/dL — ABNORMAL LOW (ref 8.9–10.3)
Chloride: 107 mmol/L (ref 98–111)
Creatinine, Ser: 0.86 mg/dL (ref 0.44–1.00)
GFR calc Af Amer: >60 mL/min (ref >60)
GFR calc non Af Amer: >60 mL/min (ref >60)
Glucose, Bld: 82 mg/dL (ref 70–99)
Potassium: 4.2 mmol/L (ref 3.5–5.1)
Sodium: 138 mmol/L (ref 135–145)
Total Bilirubin: 0.5 mg/dL (ref 0.3–1.2)
Total Protein: 6.4 g/dL — ABNORMAL LOW (ref 6.5–8.1)

## 2019-08-06 LAB — CBC WITH DIFFERENTIAL/PLATELET
Abs Immature Granulocytes: 0.01 10*3/uL (ref 0.00–0.07)
Basophils Absolute: 0 10*3/uL (ref 0.0–0.1)
Basophils Relative: 1 %
Eosinophils Absolute: 0.2 10*3/uL (ref 0.0–0.5)
Eosinophils Relative: 4 %
HCT: 34.3 % — ABNORMAL LOW (ref 36.0–46.0)
Hemoglobin: 10.7 g/dL — ABNORMAL LOW (ref 12.0–15.0)
Immature Granulocytes: 0 %
Lymphocytes Relative: 23 %
Lymphs Abs: 1 10*3/uL (ref 0.7–4.0)
MCH: 31 pg (ref 26.0–34.0)
MCHC: 31.2 g/dL (ref 30.0–36.0)
MCV: 99.4 fL (ref 80.0–100.0)
Monocytes Absolute: 0.3 10*3/uL (ref 0.1–1.0)
Monocytes Relative: 8 %
Neutro Abs: 2.8 10*3/uL (ref 1.7–7.7)
Neutrophils Relative %: 64 %
Platelets: 312 10*3/uL (ref 150–400)
RBC: 3.45 MIL/uL — ABNORMAL LOW (ref 3.87–5.11)
RDW: 13 % (ref 11.5–15.5)
WBC: 4.4 10*3/uL (ref 4.0–10.5)
nRBC: 0 % (ref 0.0–0.2)

## 2019-08-06 NOTE — Progress Notes (Signed)
PCP - Dr. Gwenevere Ghazi Cardiologist - N/A  Chest x-ray - 05/12/19 in epic EKG -  04/30/19 in epic Stress Test - greater than 2 years ago ECHO - 04/30/19 in epic Cardiac Cath - N/A  Sleep Study - N/A CPAP - N/A  Fasting Blood Sugar - 50's Checks Blood Sugar __4___ times a day  Blood Thinner Instructions: N/A Aspirin Instructions: N/A Last Dose: N/A  Anesthesia review:  N/A  Patient denies shortness of breath, fever, cough and chest pain at PAT appointment   Patient verbalized understanding of instructions that were given to them at the PAT appointment. Patient was also instructed that they will need to review over the PAT instructions again at home before surgery.

## 2019-08-08 LAB — HEMOGLOBIN A1C
Hgb A1c MFr Bld: 4.7 % — ABNORMAL LOW (ref 4.8–5.6)
Mean Plasma Glucose: 88 mg/dL

## 2019-08-09 ENCOUNTER — Other Ambulatory Visit (HOSPITAL_COMMUNITY)
Admission: RE | Admit: 2019-08-09 | Discharge: 2019-08-09 | Disposition: A | Discharge status: Home / Self Care | Payer: Medicare Other | Source: Ambulatory Visit | Attending: General Surgery | Admitting: General Surgery

## 2019-08-09 DIAGNOSIS — Z20822 Contact with and (suspected) exposure to covid-19: Secondary | ICD-10-CM | POA: Diagnosis not present

## 2019-08-09 DIAGNOSIS — Z01812 Encounter for preprocedural laboratory examination: Secondary | ICD-10-CM | POA: Diagnosis present

## 2019-08-09 LAB — SARS CORONAVIRUS 2 (TAT 6-24 HRS): SARS Coronavirus 2: NEGATIVE

## 2019-08-10 NOTE — Anesthesia Preprocedure Evaluation (Addendum)
Anesthesia Evaluation  Patient identified by MRN, date of birth, ID band Patient awake    Reviewed: Allergy & Precautions, NPO status , Patient's Chart, lab work & pertinent test results  Airway Mallampati: II  TM Distance: >3 FB Neck ROM: Full    Dental no notable dental hx.    Pulmonary neg pulmonary ROS, former smoker,    Pulmonary exam normal breath sounds clear to auscultation       Cardiovascular hypertension, Normal cardiovascular exam Rhythm:Regular Rate:Normal     Neuro/Psych Bipolar Disorder negative neurological ROS     GI/Hepatic GERD  ,(+)     substance abuse  alcohol use,   Endo/Other  diabetes  Renal/GU negative Renal ROS  negative genitourinary   Musculoskeletal negative musculoskeletal ROS (+)   Abdominal   Peds negative pediatric ROS (+)  Hematology  (+) anemia ,   Anesthesia Other Findings   Reproductive/Obstetrics negative OB ROS                            Anesthesia Physical Anesthesia Plan  ASA: III  Anesthesia Plan: General   Post-op Pain Management:    Induction: Intravenous and Rapid sequence  PONV Risk Score and Plan: 3 and Ondansetron, Dexamethasone and Treatment may vary due to age or medical condition  Airway Management Planned: Oral ETT  Additional Equipment:   Intra-op Plan:   Post-operative Plan: Extubation in OR  Informed Consent: I have reviewed the patients History and Physical, chart, labs and discussed the procedure including the risks, benefits and alternatives for the proposed anesthesia with the patient or authorized representative who has indicated his/her understanding and acceptance.     Dental advisory given  Plan Discussed with: CRNA and Surgeon  Anesthesia Plan Comments:         Anesthesia Quick Evaluation

## 2019-08-12 MED ORDER — BUPIVACAINE LIPOSOME 1.3 % IJ SUSP
20.0000 mL | Freq: Once | INTRAMUSCULAR | Status: DC
  Filled 2019-08-12: qty 20

## 2019-08-13 ENCOUNTER — Inpatient Hospital Stay (HOSPITAL_COMMUNITY)
Admission: RE | Admit: 2019-08-13 | Discharge: 2019-08-22 | DRG: 326 | Disposition: A | Discharge status: Home Health | Payer: Medicare Other | Attending: General Surgery | Admitting: General Surgery

## 2019-08-13 ENCOUNTER — Inpatient Hospital Stay (HOSPITAL_COMMUNITY): Payer: Medicare Other | Admitting: Anesthesiology

## 2019-08-13 ENCOUNTER — Encounter (HOSPITAL_COMMUNITY)
Admission: RE | Disposition: A | Discharge status: Home Health | Payer: Self-pay | Source: Home / Self Care | Attending: General Surgery

## 2019-08-13 ENCOUNTER — Encounter (HOSPITAL_COMMUNITY): Payer: Self-pay | Admitting: General Surgery

## 2019-08-13 ENCOUNTER — Inpatient Hospital Stay (HOSPITAL_COMMUNITY): Payer: Medicare Other | Admitting: Physician Assistant

## 2019-08-13 DIAGNOSIS — K660 Peritoneal adhesions (postprocedural) (postinfection): Secondary | ICD-10-CM | POA: Diagnosis present

## 2019-08-13 DIAGNOSIS — K9589 Other complications of other bariatric procedure: Principal | ICD-10-CM | POA: Diagnosis present

## 2019-08-13 DIAGNOSIS — Z888 Allergy status to other drugs, medicaments and biological substances status: Secondary | ICD-10-CM

## 2019-08-13 DIAGNOSIS — R197 Diarrhea, unspecified: Secondary | ICD-10-CM | POA: Diagnosis present

## 2019-08-13 DIAGNOSIS — F419 Anxiety disorder, unspecified: Secondary | ICD-10-CM | POA: Diagnosis present

## 2019-08-13 DIAGNOSIS — R634 Abnormal weight loss: Secondary | ICD-10-CM | POA: Diagnosis present

## 2019-08-13 DIAGNOSIS — Z87891 Personal history of nicotine dependence: Secondary | ICD-10-CM | POA: Diagnosis not present

## 2019-08-13 DIAGNOSIS — Z800 Family history of malignant neoplasm of digestive organs: Secondary | ICD-10-CM | POA: Diagnosis not present

## 2019-08-13 DIAGNOSIS — M797 Fibromyalgia: Secondary | ICD-10-CM | POA: Diagnosis present

## 2019-08-13 DIAGNOSIS — F319 Bipolar disorder, unspecified: Secondary | ICD-10-CM | POA: Diagnosis present

## 2019-08-13 DIAGNOSIS — Z833 Family history of diabetes mellitus: Secondary | ICD-10-CM

## 2019-08-13 DIAGNOSIS — R4781 Slurred speech: Secondary | ICD-10-CM | POA: Diagnosis not present

## 2019-08-13 DIAGNOSIS — Z8639 Personal history of other endocrine, nutritional and metabolic disease: Secondary | ICD-10-CM | POA: Diagnosis not present

## 2019-08-13 DIAGNOSIS — K224 Dyskinesia of esophagus: Secondary | ICD-10-CM | POA: Diagnosis present

## 2019-08-13 DIAGNOSIS — Z6822 Body mass index (BMI) 22.0-22.9, adult: Secondary | ICD-10-CM

## 2019-08-13 DIAGNOSIS — E46 Unspecified protein-calorie malnutrition: Secondary | ICD-10-CM | POA: Diagnosis present

## 2019-08-13 DIAGNOSIS — Y838 Other surgical procedures as the cause of abnormal reaction of the patient, or of later complication, without mention of misadventure at the time of the procedure: Secondary | ICD-10-CM | POA: Diagnosis present

## 2019-08-13 DIAGNOSIS — D62 Acute posthemorrhagic anemia: Secondary | ICD-10-CM | POA: Diagnosis not present

## 2019-08-13 DIAGNOSIS — E739 Lactose intolerance, unspecified: Secondary | ICD-10-CM | POA: Diagnosis present

## 2019-08-13 DIAGNOSIS — K219 Gastro-esophageal reflux disease without esophagitis: Secondary | ICD-10-CM | POA: Diagnosis present

## 2019-08-13 DIAGNOSIS — Z931 Gastrostomy status: Secondary | ICD-10-CM

## 2019-08-13 DIAGNOSIS — E43 Unspecified severe protein-calorie malnutrition: Secondary | ICD-10-CM | POA: Diagnosis present

## 2019-08-13 DIAGNOSIS — Z8679 Personal history of other diseases of the circulatory system: Secondary | ICD-10-CM

## 2019-08-13 DIAGNOSIS — Z9884 Bariatric surgery status: Secondary | ICD-10-CM

## 2019-08-13 DIAGNOSIS — Z8249 Family history of ischemic heart disease and other diseases of the circulatory system: Secondary | ICD-10-CM

## 2019-08-13 HISTORY — PX: LAPAROSCOPIC REVISION OF ROUXENY WITH UPPER ENDOSCOPY: SHX5923

## 2019-08-13 LAB — GLUCOSE, CAPILLARY
Glucose-Capillary: 60 mg/dL — ABNORMAL LOW (ref 70–99)
Glucose-Capillary: 136 mg/dL — ABNORMAL HIGH (ref 70–99)
Glucose-Capillary: 122 mg/dL — ABNORMAL HIGH (ref 70–99)

## 2019-08-13 LAB — PREGNANCY, URINE: Preg Test, Ur: NEGATIVE

## 2019-08-13 LAB — HEMOGLOBIN AND HEMATOCRIT, BLOOD
HCT: 32.3 % — ABNORMAL LOW (ref 36.0–46.0)
Hemoglobin: 10.2 g/dL — ABNORMAL LOW (ref 12.0–15.0)

## 2019-08-13 SURGERY — REVISION, GASTRIC BYPASS, ROUX-EN-Y, LAPAROSCOPIC
Anesthesia: General | Site: Abdomen

## 2019-08-13 MED ORDER — FAMOTIDINE IN NACL 20-0.9 MG/50ML-% IV SOLN
20.0000 mg | Freq: Two times a day (BID) | INTRAVENOUS | Status: DC
  Administered 2019-08-13 – 2019-08-19 (×14): 20 mg via INTRAVENOUS
  Filled 2019-08-13 (×14): qty 50

## 2019-08-13 MED ORDER — PROPOFOL 10 MG/ML IV BOLUS
INTRAVENOUS | Status: DC | PRN
  Administered 2019-08-13: 110 mg via INTRAVENOUS

## 2019-08-13 MED ORDER — FENTANYL CITRATE (PF) 100 MCG/2ML IJ SOLN
INTRAMUSCULAR | Status: AC
  Filled 2019-08-13: qty 2

## 2019-08-13 MED ORDER — CHLORHEXIDINE GLUCONATE CLOTH 2 % EX PADS: 6.0000 | MEDICATED_PAD | Freq: Once | CUTANEOUS | Status: DC

## 2019-08-13 MED ORDER — ONDANSETRON HCL 4 MG/2ML IJ SOLN
INTRAMUSCULAR | Status: AC
  Filled 2019-08-13: qty 2

## 2019-08-13 MED ORDER — BUPIVACAINE HCL 0.25 % IJ SOLN
INTRAMUSCULAR | Status: AC
  Filled 2019-08-13: qty 1

## 2019-08-13 MED ORDER — KETAMINE HCL 10 MG/ML IJ SOLN
INTRAMUSCULAR | Status: AC
  Filled 2019-08-13: qty 1

## 2019-08-13 MED ORDER — ONDANSETRON HCL 4 MG/2ML IJ SOLN
4.0000 mg | INTRAMUSCULAR | Status: DC | PRN
  Administered 2019-08-13 – 2019-08-20 (×4): 4 mg via INTRAVENOUS
  Filled 2019-08-13 (×4): qty 2

## 2019-08-13 MED ORDER — BUPIVACAINE LIPOSOME 1.3 % IJ SUSP
INTRAMUSCULAR | Status: DC | PRN
  Administered 2019-08-13: 20 mL

## 2019-08-13 MED ORDER — LIDOCAINE HCL (CARDIAC) PF 100 MG/5ML IV SOSY
PREFILLED_SYRINGE | INTRAVENOUS | Status: DC | PRN
  Administered 2019-08-13: 100 mg via INTRAVENOUS

## 2019-08-13 MED ORDER — MIDAZOLAM HCL 5 MG/5ML IJ SOLN
INTRAMUSCULAR | Status: DC | PRN
  Administered 2019-08-13 (×2): 1 mg via INTRAVENOUS

## 2019-08-13 MED ORDER — LACTATED RINGERS IR SOLN
Status: DC | PRN
  Administered 2019-08-13: 1000 mL

## 2019-08-13 MED ORDER — DEXAMETHASONE SODIUM PHOSPHATE 10 MG/ML IJ SOLN
INTRAMUSCULAR | Status: AC
  Filled 2019-08-13: qty 1

## 2019-08-13 MED ORDER — SUCCINYLCHOLINE CHLORIDE 20 MG/ML IJ SOLN
INTRAMUSCULAR | Status: DC | PRN
  Administered 2019-08-13: 100 mg via INTRAVENOUS

## 2019-08-13 MED ORDER — DEXAMETHASONE SODIUM PHOSPHATE 10 MG/ML IJ SOLN
INTRAMUSCULAR | Status: DC | PRN
  Administered 2019-08-13 (×2): 4 mg via INTRAVENOUS

## 2019-08-13 MED ORDER — BUPIVACAINE HCL (PF) 0.25 % IJ SOLN
INTRAMUSCULAR | Status: DC | PRN
  Administered 2019-08-13: 30 mL

## 2019-08-13 MED ORDER — HYDROMORPHONE HCL 1 MG/ML IJ SOLN
INTRAMUSCULAR | Status: AC
  Filled 2019-08-13: qty 2

## 2019-08-13 MED ORDER — APREPITANT 40 MG PO CAPS
40.0000 mg | ORAL_CAPSULE | ORAL | Status: AC
  Administered 2019-08-13: 40 mg via ORAL
  Filled 2019-08-13: qty 1

## 2019-08-13 MED ORDER — FENTANYL CITRATE (PF) 100 MCG/2ML IJ SOLN
INTRAMUSCULAR | Status: DC | PRN
  Administered 2019-08-13: 50 ug via INTRAVENOUS
  Administered 2019-08-13 (×4): 25 ug via INTRAVENOUS
  Administered 2019-08-13: 50 ug via INTRAVENOUS
  Administered 2019-08-13: 25 ug via INTRAVENOUS
  Administered 2019-08-13 (×2): 50 ug via INTRAVENOUS
  Administered 2019-08-13: 25 ug via INTRAVENOUS

## 2019-08-13 MED ORDER — SIMETHICONE 80 MG PO CHEW
80.0000 mg | CHEWABLE_TABLET | Freq: Four times a day (QID) | ORAL | Status: DC | PRN
  Administered 2019-08-20: 80 mg via ORAL
  Filled 2019-08-13: qty 1

## 2019-08-13 MED ORDER — KETAMINE HCL 10 MG/ML IJ SOLN
INTRAMUSCULAR | Status: DC | PRN
  Administered 2019-08-13: 25 mg via INTRAVENOUS

## 2019-08-13 MED ORDER — ALBUMIN HUMAN 5 % IV SOLN: INTRAVENOUS | Status: DC | PRN

## 2019-08-13 MED ORDER — ENOXAPARIN SODIUM 30 MG/0.3ML ~~LOC~~ SOLN
30.0000 mg | Freq: Two times a day (BID) | SUBCUTANEOUS | Status: DC
  Administered 2019-08-13 – 2019-08-15 (×4): 30 mg via SUBCUTANEOUS
  Filled 2019-08-13 (×4): qty 0.3

## 2019-08-13 MED ORDER — MIDAZOLAM HCL 2 MG/2ML IJ SOLN
INTRAMUSCULAR | Status: AC
  Filled 2019-08-13: qty 2

## 2019-08-13 MED ORDER — FENTANYL CITRATE (PF) 250 MCG/5ML IJ SOLN
INTRAMUSCULAR | Status: AC
  Filled 2019-08-13: qty 5

## 2019-08-13 MED ORDER — DEXTROSE-NACL 5-0.45 % IV SOLN: INTRAVENOUS | Status: DC

## 2019-08-13 MED ORDER — MORPHINE SULFATE (PF) 2 MG/ML IV SOLN
1.0000 mg | INTRAVENOUS | Status: DC | PRN
  Administered 2019-08-13 – 2019-08-14 (×9): 2 mg via INTRAVENOUS
  Filled 2019-08-13 (×9): qty 1

## 2019-08-13 MED ORDER — ALBUMIN HUMAN 5 % IV SOLN
INTRAVENOUS | Status: AC
  Filled 2019-08-13: qty 250

## 2019-08-13 MED ORDER — SODIUM CHLORIDE 0.9 % IV SOLN
2.0000 g | INTRAVENOUS | Status: AC
  Administered 2019-08-13: 07:00:00 2 g via INTRAVENOUS
  Filled 2019-08-13: qty 2

## 2019-08-13 MED ORDER — ROCURONIUM BROMIDE 10 MG/ML (PF) SYRINGE
PREFILLED_SYRINGE | INTRAVENOUS | Status: AC
  Filled 2019-08-13: qty 10

## 2019-08-13 MED ORDER — ACETAMINOPHEN 500 MG PO TABS
1000.0000 mg | ORAL_TABLET | Freq: Three times a day (TID) | ORAL | Status: DC
  Administered 2019-08-14 – 2019-08-22 (×19): 1000 mg via ORAL
  Filled 2019-08-13 (×20): qty 2

## 2019-08-13 MED ORDER — LIDOCAINE 20MG/ML (2%) 15 ML SYRINGE OPTIME
INTRAMUSCULAR | Status: DC | PRN
  Administered 2019-08-13: 1.5 mg/kg/h via INTRAVENOUS

## 2019-08-13 MED ORDER — SUGAMMADEX SODIUM 200 MG/2ML IV SOLN
INTRAVENOUS | Status: DC | PRN
  Administered 2019-08-13: 200 mg via INTRAVENOUS

## 2019-08-13 MED ORDER — 0.9 % SODIUM CHLORIDE (POUR BTL) OPTIME
TOPICAL | Status: DC | PRN
  Administered 2019-08-13: 1000 mL

## 2019-08-13 MED ORDER — METOPROLOL TARTRATE 5 MG/5ML IV SOLN: 5.0000 mg | Freq: Four times a day (QID) | INTRAVENOUS | Status: DC | PRN

## 2019-08-13 MED ORDER — ACETAMINOPHEN 500 MG PO TABS
1000.0000 mg | ORAL_TABLET | ORAL | Status: AC
  Administered 2019-08-13: 1000 mg via ORAL
  Filled 2019-08-13: qty 2

## 2019-08-13 MED ORDER — PROPOFOL 10 MG/ML IV BOLUS
INTRAVENOUS | Status: AC
  Filled 2019-08-13: qty 20

## 2019-08-13 MED ORDER — HYDROMORPHONE HCL 1 MG/ML IJ SOLN
0.2500 mg | INTRAMUSCULAR | Status: DC | PRN
  Administered 2019-08-13 (×2): 0.5 mg via INTRAVENOUS

## 2019-08-13 MED ORDER — GABAPENTIN 300 MG PO CAPS
300.0000 mg | ORAL_CAPSULE | ORAL | Status: AC
  Administered 2019-08-13: 300 mg via ORAL
  Filled 2019-08-13: qty 1

## 2019-08-13 MED ORDER — DEXAMETHASONE SODIUM PHOSPHATE 4 MG/ML IJ SOLN: 4.0000 mg | INTRAMUSCULAR | Status: DC

## 2019-08-13 MED ORDER — ENSURE MAX PROTEIN PO LIQD: 2.0000 [oz_av] | ORAL | Status: DC

## 2019-08-13 MED ORDER — HEPARIN SODIUM (PORCINE) 5000 UNIT/ML IJ SOLN
5000.0000 [IU] | INTRAMUSCULAR | Status: AC
  Administered 2019-08-13: 5000 [IU] via SUBCUTANEOUS
  Filled 2019-08-13: qty 1

## 2019-08-13 MED ORDER — ONDANSETRON HCL 4 MG/2ML IJ SOLN
INTRAMUSCULAR | Status: DC | PRN
  Administered 2019-08-13 (×2): 4 mg via INTRAVENOUS

## 2019-08-13 MED ORDER — ROCURONIUM BROMIDE 100 MG/10ML IV SOLN
INTRAVENOUS | Status: DC | PRN
  Administered 2019-08-13: 10 mg via INTRAVENOUS
  Administered 2019-08-13: 20 mg via INTRAVENOUS
  Administered 2019-08-13: 30 mg via INTRAVENOUS
  Administered 2019-08-13: 5 mg via INTRAVENOUS

## 2019-08-13 MED ORDER — ACETAMINOPHEN 160 MG/5ML PO SOLN
1000.0000 mg | Freq: Three times a day (TID) | ORAL | Status: DC
  Administered 2019-08-16 – 2019-08-19 (×5): 1000 mg via ORAL
  Filled 2019-08-13 (×5): qty 40.6

## 2019-08-13 MED ORDER — DEXTROSE 50 % IV SOLN
INTRAVENOUS | Status: AC
  Administered 2019-08-13: 25 mL
  Filled 2019-08-13: qty 50

## 2019-08-13 MED ORDER — KETOROLAC TROMETHAMINE 30 MG/ML IJ SOLN: 30.0000 mg | Freq: Once | INTRAMUSCULAR | Status: DC | PRN

## 2019-08-13 MED ORDER — SUCCINYLCHOLINE CHLORIDE 200 MG/10ML IV SOSY
PREFILLED_SYRINGE | INTRAVENOUS | Status: AC
  Filled 2019-08-13: qty 10

## 2019-08-13 MED ORDER — OXYCODONE HCL 5 MG/5ML PO SOLN
5.0000 mg | Freq: Four times a day (QID) | ORAL | Status: DC | PRN
  Administered 2019-08-13 – 2019-08-14 (×2): 5 mg via ORAL
  Filled 2019-08-13 (×2): qty 5

## 2019-08-13 MED ORDER — SCOPOLAMINE 1 MG/3DAYS TD PT72
1.0000 | MEDICATED_PATCH | TRANSDERMAL | Status: DC
  Administered 2019-08-13: 1.5 mg via TRANSDERMAL
  Filled 2019-08-13: qty 1

## 2019-08-13 MED ORDER — LIDOCAINE HCL 2 % IJ SOLN
INTRAMUSCULAR | Status: AC
  Filled 2019-08-13: qty 20

## 2019-08-13 MED ORDER — LIDOCAINE 2% (20 MG/ML) 5 ML SYRINGE
INTRAMUSCULAR | Status: AC
  Filled 2019-08-13: qty 5

## 2019-08-13 MED ORDER — LACTATED RINGERS IV SOLN
INTRAVENOUS | Status: DC
  Administered 2019-08-13: 1000 mL via INTRAVENOUS

## 2019-08-13 SURGICAL SUPPLY — 84 items
APL PRP STRL LF DISP 70% ISPRP (MISCELLANEOUS) ×1
APL SKNCLS STERI-STRIP NONHPOA (GAUZE/BANDAGES/DRESSINGS) ×1
APPLIER CLIP 5 13 M/L LIGAMAX5 (MISCELLANEOUS) IMPLANT
APPLIER CLIP ROT 10 11.4 M/L (STAPLE) IMPLANT
APPLIER CLIP ROT 13.4 12 LRG (CLIP) ×3 IMPLANT
APR CLP LRG 13.4X12 ROT 20 MLT (CLIP) ×1
APR CLP MED LRG 11.4X10 (STAPLE)
APR CLP MED LRG 5 ANG JAW (MISCELLANEOUS)
BENZOIN TINCTURE PRP APPL 2/3 (GAUZE/BANDAGES/DRESSINGS) ×3 IMPLANT
BLADE SURG SZ11 CARB STEEL (BLADE) ×3 IMPLANT
BNDG ADH 1X3 SHEER STRL LF (GAUZE/BANDAGES/DRESSINGS) ×7 IMPLANT
BNDG ADH THN 3X1 STRL LF (GAUZE/BANDAGES/DRESSINGS) ×1
CABLE HIGH FREQUENCY MONO STRZ (ELECTRODE) IMPLANT
CATH FOLEY 2WAY SLVR  5CC 12FR (CATHETERS) ×3
CATH FOLEY 2WAY SLVR 5CC 12FR (CATHETERS) IMPLANT
CATH ROBINSON RED A/P 12FR (CATHETERS) IMPLANT
CHLORAPREP W/TINT 26 (MISCELLANEOUS) ×3 IMPLANT
CLIP APPLIE 5 13 M/L LIGAMAX5 (MISCELLANEOUS) IMPLANT
CLIP APPLIE ROT 10 11.4 M/L (STAPLE) IMPLANT
CLIP APPLIE ROT 13.4 12 LRG (CLIP) IMPLANT
CLOSURE WOUND 1/2 X4 (GAUZE/BANDAGES/DRESSINGS) ×1
COVER SURGICAL LIGHT HANDLE (MISCELLANEOUS) ×3 IMPLANT
COVER WAND RF STERILE (DRAPES) ×2 IMPLANT
DEVICE SUTURE ENDOST 10MM (ENDOMECHANICALS) ×1 IMPLANT
DRAIN CHANNEL 19F RND (DRAIN) ×1 IMPLANT
DRAIN PENROSE 0.25X18 (DRAIN) ×1 IMPLANT
ELECT L-HOOK LAP 45CM DISP (ELECTROSURGICAL) ×3 IMPLANT
ELECTRODE L-HOOK LAP 45CM DISP (ELECTROSURGICAL) ×1 IMPLANT
EVACUATOR SILICONE 100CC (DRAIN) ×1 IMPLANT
GAUZE 4X4 16PLY RFD (DISPOSABLE) ×3 IMPLANT
GAUZE SPONGE 4X4 12PLY STRL (GAUZE/BANDAGES/DRESSINGS) IMPLANT
GLOVE BIO SURGEON STRL SZ7 (GLOVE) ×3 IMPLANT
GLOVE BIOGEL PI IND STRL 7.0 (GLOVE) ×1 IMPLANT
GLOVE BIOGEL PI INDICATOR 7.0 (GLOVE) ×2
GOWN STRL REUS W/TWL XL LVL3 (GOWN DISPOSABLE) ×12 IMPLANT
GRASPER ENDOPATH ANVIL 10MM (MISCELLANEOUS) ×2 IMPLANT
HANDLE STAPLE EGIA 4 XL (STAPLE) ×3 IMPLANT
HOVERMATT SINGLE USE (MISCELLANEOUS) ×3 IMPLANT
KIT BASIN OR (CUSTOM PROCEDURE TRAY) ×3 IMPLANT
KIT GASTRIC LAVAGE 34FR ADT (SET/KITS/TRAYS/PACK) IMPLANT
KIT TURNOVER KIT A (KITS) ×2 IMPLANT
MARKER SKIN DUAL TIP RULER LAB (MISCELLANEOUS) ×5 IMPLANT
NDL SPNL 22GX3.5 QUINCKE BK (NEEDLE) ×1 IMPLANT
NEEDLE SPNL 22GX3.5 QUINCKE BK (NEEDLE) ×3 IMPLANT
PACK CARDIOVASCULAR III (CUSTOM PROCEDURE TRAY) ×3 IMPLANT
PENCIL SMOKE EVACUATOR (MISCELLANEOUS) IMPLANT
RELOAD EGIA 45 MED/THCK PURPLE (STAPLE) IMPLANT
RELOAD EGIA 60 MED/THCK PURPLE (STAPLE) ×6 IMPLANT
RELOAD EGIA 60 TAN VASC (STAPLE) IMPLANT
RELOAD ENDO STITCH 2.0 (ENDOMECHANICALS)
RELOAD STAPLE 60 BLK XTHK ART (STAPLE) IMPLANT
RELOAD STAPLE 60 MED/THCK ART (STAPLE) IMPLANT
RELOAD SUT SNGL STCH ABSRB 2-0 (ENDOMECHANICALS) ×4 IMPLANT
RELOAD SUT SNGL STCH BLK 2-0 (ENDOMECHANICALS) ×3 IMPLANT
RELOAD TRI 2.0 60 XTHK VAS SUL (STAPLE) ×6 IMPLANT
SCISSORS LAP 5X45 EPIX DISP (ENDOMECHANICALS) ×3 IMPLANT
SET IRRIG TUBING LAPAROSCOPIC (IRRIGATION / IRRIGATOR) ×3 IMPLANT
SET TUBE SMOKE EVAC HIGH FLOW (TUBING) ×3 IMPLANT
SHEARS HARMONIC ACE PLUS 45CM (MISCELLANEOUS) ×3 IMPLANT
SLEEVE XCEL OPT CAN 5 100 (ENDOMECHANICALS) ×6 IMPLANT
SOL ANTI FOG 6CC (MISCELLANEOUS) ×1 IMPLANT
SOLUTION ANTI FOG 6CC (MISCELLANEOUS) ×2
STAPLER CIRC 21 3.5 SULU (STAPLE) ×2 IMPLANT
STAPLER VISISTAT 35W (STAPLE) IMPLANT
STRIP CLOSURE SKIN 1/2X4 (GAUZE/BANDAGES/DRESSINGS) ×2 IMPLANT
SUT ETHILON 2 0 PS N (SUTURE) ×5 IMPLANT
SUT MNCRL AB 4-0 PS2 18 (SUTURE) ×3 IMPLANT
SUT PDS AB 0 CT1 36 (SUTURE) ×2 IMPLANT
SUT RELOAD ENDO STITCH 2 48X1 (ENDOMECHANICALS) IMPLANT
SUT RELOAD ENDO STITCH 2.0 (ENDOMECHANICALS) IMPLANT
SUT SILK 0 SH 30 (SUTURE) IMPLANT
SUTURE RELOAD END STTCH 2 48X1 (ENDOMECHANICALS) IMPLANT
SUTURE RELOAD ENDO STITCH 2.0 (ENDOMECHANICALS) IMPLANT
SYR 20ML LL LF (SYRINGE) ×3 IMPLANT
SYR 50ML LL SCALE MARK (SYRINGE) ×3 IMPLANT
TOWEL OR 17X26 10 PK STRL BLUE (TOWEL DISPOSABLE) ×3 IMPLANT
TOWEL OR NON WOVEN STRL DISP B (DISPOSABLE) ×3 IMPLANT
TRAY FOLEY MTR SLVR 14FR STAT (SET/KITS/TRAYS/PACK) ×2 IMPLANT
TRAY FOLEY MTR SLVR 16FR STAT (SET/KITS/TRAYS/PACK) IMPLANT
TROCAR BLADELESS 15MM (ENDOMECHANICALS) ×4 IMPLANT
TROCAR BLADELESS OPT 5 100 (ENDOMECHANICALS) ×3 IMPLANT
TROCAR XCEL 12X100 BLDLESS (ENDOMECHANICALS) ×3 IMPLANT
TUBING CONNECTING 10 (TUBING) ×4 IMPLANT
TUBING CONNECTING 10' (TUBING) ×2

## 2019-08-13 NOTE — Op Note (Signed)
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  Anesthesia: Gen.   Description of procedure:  Endoscope was introduced through the mouth and oropharynx and into the esophagus. The new anastomosis is just a few centimeters from the GE junction, and is widely patent and easily traversed with the endoscope. The staple line is hemostatic and there was no leak identified. The stomach is desufflated and the endoscope removed.   Clovis Riley, M.D. General, Bariatric, & Minimally Invasive Surgery [facility]

## 2019-08-13 NOTE — Progress Notes (Signed)
Rounded on patient, sitting up in bed.  No complaints of pain/nausea.  Patient and [place] discussed the ordered UGI for in the morning.  Patient aware that she will have nothing to drink until testing completed and resulted in the morning.  Bedside RN made aware of the above conversation.

## 2019-08-13 NOTE — Transfer of Care (Signed)
Immediate Anesthesia Transfer of Care Note  Patient: Angie [name]  Procedure(s) Performed: LAPAROSCOPIC REVISION OF ROUX EN Y WITH UPPER ENDOSCOPY; REMOVAL GASTROSTOMY TUBE, TAKEDOWN OF GASTROJEJUNOSTOMY; RESECTION OF SMALL INTESTINE ERAS PATHWAY (N/A Abdomen)  Patient Location: PACU  Anesthesia Type:General  Level of Consciousness: oriented, drowsy and patient cooperative  Airway & Oxygen Therapy: Patient Spontanous Breathing and Patient connected to face mask oxygen  Post-op Assessment: Report given to RN and Post -op Vital signs reviewed and stable  Post vital signs: Reviewed and stable  Last Vitals:  Vitals Value Taken Time  BP    Temp    Pulse    Resp    SpO2      Last Pain:  Vitals:   08/13/19 0605  TempSrc: Oral  PainSc: 3       Patients Stated Pain Goal: 2 ([id number])  Complications: No apparent anesthesia complications

## 2019-08-13 NOTE — Op Note (Signed)
Preoperative diagnosis: gastric bypass, protein calorie malnutritio  Postoperative diagnosis: same   Procedure: laparoscopic takedown of gastrojejunostomy, creation of gastrogastric anastomosis, take down of gastrostomy tube, small intestine resection  Surgeon: Gurney Maxin, M.D.  Asst: Kaylyn Lim, Romana Juniper  Anesthesia: general  Indications for procedure: Angie [name] is a 54 y.o. year old female with symptoms of recurrent electrolyte abnormalities, weight loss, g tube dependence presents for reversal of gastric bypass.  Description of procedure: The patient was brought into the operative suite. Anesthesia was administered with General endotracheal anesthesia. WHO checklist was applied. The patient was then placed in supine position. The area was prepped and draped in the usual sterile fashion.  Next, a 5 mm trocar was placed in the right subcostal area. A 2mm trocar was used to gain access to the peritoneal cavity by optical entry technique. Pneumoperitoneum was applied with a high flow and low pressure. The laparoscope was reinserted to confirm position.  Upon initial evaluation of the abdomen there was stomach coming up to the left subcostal gastrostomy site.  There is minimal other adhesive disease.  Additional trochars were placed.  One 5 mm trocar was placed in the periumbilical space, one 5 mm trocar was placed in the left lateral space, and 1 12 mm trocar was placed in the right midabdomen.  There were adhesions to the liver and these were taken down with scissors and occasional harmonic scalpel carefully.  The left lobe of the liver was retracted with the [facility] retractor.  Further dissection allowed visualization of the gastrojejunal anastomosis.  Additional dissection of adhesions to the gastric remnant from the Roux limb and Roux limb mesentery were divided with harmonic scalpel and scissor.  The Roux limb was measured at 45 cm and the common channel was measured at 450  cm.  At this time it was clear that we be able to complete our new anastomoses therefore, the gastrostomy site was addressed.  The stomach going up to the gastrostomy site was divided with a 60 mm 3 to 4 mm tristapler load.  Cautery was used to make an incision around the gastrostomy skin site.  Cautery was used to dissect down through subcutaneous tissues and remove the tract plus the stapled portion of the stomach in 1 piece. A 15 mm trocar was placed through the defect.  Next, additional scissors and blunt dissection were used to free all adhesions away from the gastrojejunal anastomosis.  The pouch was quite and small and care was taken to maximize as much of the pouch as possible. The stomach pouch was divided just proximal to the anastomosis using a 60 mm 3 to 4 mm tristapler load.  This allowed the Roux limb to be completely removed from the epigastric portion and placed back into the mid abdomen.  This allowed for visualization of the remnant stomach and additional adhesion were present which were lysed with scissors and harmonic scalpel.  Next, a gastrotomy was made in the inferior aspect of the gastric pouch with harmonic scalpel and a 21 mm orvil was passed via the mouth into the pouch. This was attempted twice as the first time the orvil disconnected from the tubing and was removed in its entirety. The orvil was temporarily stuck at the upper esophageal sphincter and required manipulation to pass. The orvil came into the gastrotomy and the tubing was removed.  The gastrostomy staple line was then removed to create a gastrotomy in the remnant. The 15 mm trocar was removed from the subcostal  incision 21 EEA stapler was passed through this incision. The EEA was introduced into the gastrotomy and advanced so the end of the stapler was against the upper anterior gastric wall. The spike was introduced and connected to the orvil and anastomosis of the pouch to the remnant stomach was made. The orvil and  stapler were safely removed. A 60 mm 3-4 mm tristapler was used to close the gastrotomy with care taken to ensure a good distance from the angularis.  Endoscopy was performed for leak test showing no leak and also the endoscope was able to be easily passed through the anastomosis.  Next, the Roux limb was inspected and the change anastomosis was found room was divided just before the anastomosis and then harmonic scalpel used to divide the mesentery to free the Roux limb in its entirety rhythm was then removed from the abdomen. A 19 fr drain was brought into the abdomen and the tip was placed beneath the G-G anastomosis.  The abdomen was inspected for bleeding there was small amount of blood coming off of the gastrostomy closure site 3 clips were placed on this area.  There were no other signs of bleeding. The 15 mm trocar in the right mid abdomen was removed and fascia closed with 0 vicryl by suture passer. All trocars were removed. The left subcostal incision was closed with 0 PDS in running fashion. The skin was closed with 4-0 monocryl in subcuticular fashion. Steristrips and bandaids were put in place for dressing. All counts were correct.  Findings: negative leak test  Specimen: Roux limb small intestine  Implant: 19 f blake drain   Blood loss: 50 ml  Local anesthesia: 50 ml Exparel:Marcaine Mix  Complications: none  Gurney Maxin, M.D. General, Bariatric, & Minimally Invasive Surgery [facility]

## 2019-08-13 NOTE — Anesthesia Postprocedure Evaluation (Signed)
Anesthesia Post Note  Patient: Angie [name]  Procedure(s) Performed: LAPAROSCOPIC REVISION OF ROUX EN Y WITH UPPER ENDOSCOPY; REMOVAL GASTROSTOMY TUBE, TAKEDOWN OF GASTROJEJUNOSTOMY; RESECTION OF SMALL INTESTINE ERAS PATHWAY (N/A Abdomen)     Patient location during evaluation: PACU Anesthesia Type: General Level of consciousness: awake and alert Pain management: pain level controlled Vital Signs Assessment: post-procedure vital signs reviewed and stable Respiratory status: spontaneous breathing, nonlabored ventilation, respiratory function stable and patient connected to nasal cannula oxygen Cardiovascular status: blood pressure returned to baseline and stable Postop Assessment: no apparent nausea or vomiting Anesthetic complications: no    Last Vitals:  Vitals:   08/13/19 1430 08/13/19 1516  BP:  (!) 170/96  Pulse:  79  Resp:    Temp: (!) 36.3 C 36.9 C  SpO2:  100%    Last Pain:  Vitals:   08/13/19 1549  TempSrc:   PainSc: 9                  [name] S

## 2019-08-13 NOTE — Anesthesia Procedure Notes (Signed)

## 2019-08-13 NOTE — H&P (Signed)
Angie [name] is an 54 y.o. female.   Chief Complaint: diarrhea HPI: 54 yo female with multiple complications after gastric bypass. She had a revision for stenotic ulcers. She had been g tube dependent for almost a year with diarrhea issues as well.  Past Medical History:  Diagnosis Date  . Abnormal Pap smear   . Alcohol abuse   . Anemia    IDA  . Anxiety    Takes xanax  . Arthritis    left hip/knees, lower spine  . Bilateral lower extremity edema   . Bipolar disorder ([place])   . Blood transfusion without reported diagnosis last done 04-25-17  . Bulging lumbar disc   . Bursitis    left shoulder  . Chronic lower back pain   . Cut    right middle finger with knife small cut healing pt instructed to keep clean and dry no redness or drainage  . Depression   . Diverticulitis   . Fibromyalgia   . Gastrointestinal tube present ([place])   . GERD (gastroesophageal reflux disease)   . Hypertension    none since weight loss, no medications at this time  . Hypoglycemia   . Lactose intolerance in adult 2017  . Migraines    "weekly @ least" (04/26/2016)  . Seizures ([place])    one Dec. 2017 due to throat closing  . Type II diabetes mellitus ([place])    "before the gastric bypass" (04/26/2016) states she no longer has diabetes    Past Surgical History:  Procedure Laterality Date  . BALLOON DILATION N/A 05/27/2016   Procedure: BALLOON DILATION;  Surgeon: Doran Stabler, MD;  Location: [facility];  Service: Gastroenterology;  Laterality: N/A;  . BALLOON DILATION N/A 04/21/2017   Procedure: BALLOON DILATION;  Surgeon: Doran Stabler, MD;  Location: [place];  Service: Gastroenterology;  Laterality: N/A;  . CERVICAL CONE BIOPSY    . [place]; 1996; 1998; 2014  . CESAREAN SECTION WITH BILATERAL TUBAL LIGATION Bilateral 10/28/2012   Procedure: Repeat cesarean section with delivery of baby boy at 28. Apgars 8/9.  BILATERAL TUBAL LIGATION;  Surgeon: Florian Buff, MD;  Location:  [facility];  Service: Obstetrics;  Laterality: Bilateral;  . DILATION AND CURETTAGE OF UTERUS    . ESOPHAGOGASTRODUODENOSCOPY N/A 05/27/2016   Procedure: ESOPHAGOGASTRODUODENOSCOPY (EGD);  Surgeon: Doran Stabler, MD;  Location: [facility];  Service: Gastroenterology;  Laterality: N/A;  . ESOPHAGOGASTRODUODENOSCOPY (EGD) WITH PROPOFOL N/A 04/23/2016   Procedure: ESOPHAGOGASTRODUODENOSCOPY (EGD) WITH PROPOFOL;  Surgeon: Doran Stabler, MD;  Location: [place];  Service: Endoscopy;  Laterality: N/A;  . ESOPHAGOGASTRODUODENOSCOPY (EGD) WITH PROPOFOL N/A 01/03/2017   Procedure: ESOPHAGOGASTRODUODENOSCOPY (EGD) WITH PROPOFOL;  Surgeon: Doran Stabler, MD;  Location: [facility];  Service: Gastroenterology;  Laterality: N/A;  . ESOPHAGOGASTRODUODENOSCOPY (EGD) WITH PROPOFOL N/A 04/21/2017   Procedure: ESOPHAGOGASTRODUODENOSCOPY (EGD) WITH PROPOFOL;  Surgeon: Doran Stabler, MD;  Location: [place];  Service: Gastroenterology;  Laterality: N/A;  . ESOPHAGOGASTRODUODENOSCOPY (EGD) WITH PROPOFOL N/A 05/02/2019   Procedure: ESOPHAGOGASTRODUODENOSCOPY (EGD) WITH PROPOFOL;  Surgeon: Irene Shipper, MD;  Location[facility];  Service: Endoscopy;  Laterality: N/A;  . GASTROSTOMY N/A 08/16/2016   Procedure: LAPAROSCOPIC INSERTION OF GASTROSTOMY TUBE IN REMNANT STOMACH;  Surgeon: Arta Bruce Marcea Rojek, MD;  Location: [facility];  Service: General;  Laterality: N/A;  . GASTROSTOMY N/A 05/16/2017   Procedure: Replacement of  GASTROSTOMY TUBE;  Surgeon: [name], MD;  Location: [facility];  Service: General;  Laterality: N/A;  . IR CM INJ ANY COLONIC TUBE W/FLUORO  05/15/2019  . LAPAROSCOPIC INSERTION GASTROSTOMY TUBE Left 05/04/2019   Procedure: LAPAROSCOPIC INSERTION GASTROSTOMY TUBE;  Surgeon: Kieth Brightly Arta Bruce, MD;  Location: [place];  Service: General;  Laterality: Left;  . LAPAROSCOPIC REVISION OF GASTROJEJUNOSTOMY Left 05/16/2017   Procedure: LAPAROSCOPIC REVISION OF GASTROJEJUNOSTOMY;   Surgeon: Sybol Morre, Arta Bruce, MD;  Location: [facility];  Service: General;  Laterality: Left;  . ROUX-EN-Y GASTRIC BYPASS  2007  . TUBAL LIGATION  2014    Family History  Problem Relation Age of Onset  . Diabetes Father   . Heart disease Father   . Depression Maternal Grandmother   . Heart disease Maternal Grandfather   . Depression Paternal Grandmother   . Colon cancer Paternal Grandfather   . Pancreatic cancer Paternal Grandfather    Social History:  reports that she quit smoking about 7 years ago. Her smoking use included cigarettes. She has a 2.00 pack-year smoking history. She has never used smokeless tobacco. She reports current alcohol use of about 17.0 standard drinks of alcohol per week. She reports that she does not use drugs.  Allergies:  Allergies  Allergen Reactions  . Iron Anaphylaxis    Had acute allergy with tachycardia, attention and generalized itching shortly after receiving nulecit ( iron gluconate) infusion.  . Lactose Intolerance ([place])     Medications Prior to Admission  Medication Sig Dispense Refill  . ALPRAZolam (XANAX) 1 MG tablet Take 1 tablet (1 mg total) by mouth at bedtime as needed for anxiety. (Patient taking differently: Take 1 mg by mouth in the morning, at noon, and at bedtime. ) 30 tablet 0  . CARAFATE 1 GM/10ML suspension Take 10 mLs by mouth 2 (two) times daily.   4  . Cyanocobalamin (VITAMIN B-12 PO) Place 2 mLs under the tongue daily.    . furosemide (LASIX) 20 MG tablet Take 20 mg by mouth daily.    Marland Kitchen lidocaine (XYLOCAINE) 5 % ointment Apply 1 application topically 3 (three) times daily as needed (pain.). Apply to Bilateral shoulders    . liver oil-zinc oxide (DESITIN) 40 % ointment Apply topically 2 (two) times daily. Apply around feeding tube 56.7 g 1  . magnesium gluconate (MAGONATE) 500 MG tablet Take 1 tablet (500 mg total) by mouth daily. 30 tablet 1  . Multiple Vitamins-Minerals (MULTIVITAMIN WITH IRON-MINERALS) liquid Take 5 mLs by  mouth daily. Centrum Liquid Multivitamin    . OLANZapine (ZYPREXA) 2.5 MG tablet Take 2.5 mg by mouth daily in the afternoon.   2  . oxyCODONE (ROXICODONE) 15 MG immediate release tablet Take 15 mg by mouth in the morning, at noon, in the evening, and at bedtime.     Marland Kitchen oxymetazoline (AFRIN) 0.05 % nasal spray Place 2 sprays into both nostrils 2 (two) times daily as needed for congestion.    . pantoprazole (PROTONIX) 40 MG tablet Take 1 tablet (40 mg total) by mouth at bedtime. 90 tablet 3  . protein supplement (PROSOURCE NO CARB) LIQD Take 30 mLs by mouth in the morning and at bedtime.    Marland Kitchen QUEtiapine (SEROQUEL) 50 MG tablet Take 50 mg by mouth at bedtime.    . blood glucose meter kit and supplies Dispense based on patient and insurance preference. Use up to four times daily as directed. (FOR ICD-10 E10.9, E11.9). 1 each 0  . cyclobenzaprine (FLEXERIL) 10 MG tablet Take 1 tablet (10 mg total) by mouth 3 (three)  times daily as needed for muscle spasms. (Patient not taking: Reported on 07/30/2019) 30 tablet 0    Results for orders placed or performed during the hospital encounter of 08/13/19 (from the past 48 hour(s))  Pregnancy, urine STAT morning of surgery     Status: None   Collection Time: 08/13/19  5:33 AM  Result Value Ref Range   Preg Test, Ur NEGATIVE NEGATIVE    Comment:        THE SENSITIVITY OF THIS METHODOLOGY IS >20 mIU/mL. Performed at [facility], [place] [address], [place], [place] [address]   Glucose, capillary     Status: Abnormal   Collection Time: 08/13/19  5:53 AM  Result Value Ref Range   Glucose-Capillary 60 (L) 70 - 99 mg/dL    Comment: Glucose reference range applies only to samples taken after fasting for at least 8 hours.  Type and screen [place]     Status: None (Preliminary result)   Collection Time: 08/13/19  6:20 AM  Result Value Ref Range   ABO/RH(D) PENDING    Antibody Screen PENDING    Sample Expiration       08/16/2019,2359 Performed at [facility], [place] [address], [place], [place] [address]   Glucose, capillary     Status: Abnormal   Collection Time: 08/13/19  6:40 AM  Result Value Ref Range   Glucose-Capillary 136 (H) 70 - 99 mg/dL    Comment: Glucose reference range applies only to samples taken after fasting for at least 8 hours.   Comment 1 Notify RN    No results found.  Review of Systems  Constitutional: Negative for chills and fever.  HENT: Negative for hearing loss.   Respiratory: Negative for cough.   Cardiovascular: Negative for chest pain and palpitations.  Gastrointestinal: Negative for abdominal pain, nausea and vomiting.  Genitourinary: Negative for dysuria and urgency.  Musculoskeletal: Negative for myalgias and neck pain.  Skin: Negative for rash.  Neurological: Negative for dizziness and headaches.  Hematological: Does not bruise/bleed easily.  Psychiatric/Behavioral: Negative for suicidal ideas.    Blood pressure (!) 147/92, pulse 79, temperature 98.2 F (36.8 C), temperature source Oral, resp. rate 16, height 5' (1.524 m), weight 51.3 kg, SpO2 100 %. Physical Exam  Vitals reviewed. Constitutional: She is oriented to person, place, and time. She appears well-developed and well-nourished.  HENT:  Head: Normocephalic and atraumatic.  Eyes: Pupils are equal, round, and reactive to light. Conjunctivae and EOM are normal.  Cardiovascular: Normal rate and regular rhythm.  Respiratory: Effort normal and breath sounds normal.  GI: Soft. Bowel sounds are normal. She exhibits no distension. There is no abdominal tenderness.  Musculoskeletal:        General: Normal range of motion.     Cervical back: Normal range of motion and neck supple.  Neurological: She is alert and oriented to person, place, and time.  Skin: Skin is warm and dry.  Psychiatric: She has a normal mood and affect. Her behavior is normal.    Assessment/Plan 54 yo female with RNY  bypass with multiple malnutrition complications and ulcer disease presents for reversal -lap RNY gastric bypass with G-G anastomosis and intestinal reaarrangement, removal of g tube -ERAS and bariatric protocol -inpatient admission  [name], MD 08/13/2019, 7:00 AM

## 2019-08-14 ENCOUNTER — Other Ambulatory Visit: Payer: Self-pay

## 2019-08-14 ENCOUNTER — Inpatient Hospital Stay (HOSPITAL_COMMUNITY): Payer: Medicare Other

## 2019-08-14 LAB — CBC WITH DIFFERENTIAL/PLATELET
Abs Immature Granulocytes: 0.11 10*3/uL — ABNORMAL HIGH (ref 0.00–0.07)
Basophils Absolute: 0 10*3/uL (ref 0.0–0.1)
Basophils Relative: 0 %
Eosinophils Absolute: 0 10*3/uL (ref 0.0–0.5)
Eosinophils Relative: 0 %
HCT: 25.6 % — ABNORMAL LOW (ref 36.0–46.0)
Hemoglobin: 8.3 g/dL — ABNORMAL LOW (ref 12.0–15.0)
Immature Granulocytes: 1 %
Lymphocytes Relative: 6 %
Lymphs Abs: 0.7 10*3/uL (ref 0.7–4.0)
MCH: 30.7 pg (ref 26.0–34.0)
MCHC: 32.4 g/dL (ref 30.0–36.0)
MCV: 94.8 fL (ref 80.0–100.0)
Monocytes Absolute: 1 10*3/uL (ref 0.1–1.0)
Monocytes Relative: 10 %
Neutro Abs: 8.3 10*3/uL — ABNORMAL HIGH (ref 1.7–7.7)
Neutrophils Relative %: 83 %
Platelets: 258 10*3/uL (ref 150–400)
RBC: 2.7 MIL/uL — ABNORMAL LOW (ref 3.87–5.11)
RDW: 13.1 % (ref 11.5–15.5)
WBC: 10.2 10*3/uL (ref 4.0–10.5)
nRBC: 0 % (ref 0.0–0.2)

## 2019-08-14 LAB — HEMOGLOBIN AND HEMATOCRIT, BLOOD
HCT: 24.7 % — ABNORMAL LOW (ref 36.0–46.0)
Hemoglobin: 8.1 g/dL — ABNORMAL LOW (ref 12.0–15.0)

## 2019-08-14 LAB — COMPREHENSIVE METABOLIC PANEL
ALT: 13 U/L (ref 0–44)
AST: 28 U/L (ref 15–41)
Albumin: 2.9 g/dL — ABNORMAL LOW (ref 3.5–5.0)
Alkaline Phosphatase: 51 U/L (ref 38–126)
Anion gap: 9 (ref 5–15)
BUN: 14 mg/dL (ref 6–20)
CO2: 22 mmol/L (ref 22–32)
Calcium: 8.5 mg/dL — ABNORMAL LOW (ref 8.9–10.3)
Chloride: 102 mmol/L (ref 98–111)
Creatinine, Ser: 0.84 mg/dL (ref 0.44–1.00)
GFR calc Af Amer: >60 mL/min (ref >60)
GFR calc non Af Amer: >60 mL/min (ref >60)
Glucose, Bld: 125 mg/dL — ABNORMAL HIGH (ref 70–99)
Potassium: 4.6 mmol/L (ref 3.5–5.1)
Sodium: 133 mmol/L — ABNORMAL LOW (ref 135–145)
Total Bilirubin: 0.5 mg/dL (ref 0.3–1.2)
Total Protein: 5.4 g/dL — ABNORMAL LOW (ref 6.5–8.1)

## 2019-08-14 MED ORDER — CYCLOBENZAPRINE HCL 10 MG PO TABS
10.0000 mg | ORAL_TABLET | Freq: Three times a day (TID) | ORAL | Status: DC | PRN
  Administered 2019-08-21 (×2): 10 mg via ORAL
  Filled 2019-08-14 (×2): qty 1

## 2019-08-14 MED ORDER — HYDROMORPHONE HCL 1 MG/ML IJ SOLN
1.0000 mg | INTRAMUSCULAR | Status: DC | PRN
  Administered 2019-08-14 – 2019-08-18 (×27): 1 mg via INTRAVENOUS
  Filled 2019-08-14 (×27): qty 1

## 2019-08-14 MED ORDER — ALPRAZOLAM 1 MG PO TABS
1.0000 mg | ORAL_TABLET | Freq: Every evening | ORAL | Status: DC | PRN
  Administered 2019-08-14 – 2019-08-21 (×8): 1 mg via ORAL
  Filled 2019-08-14 (×8): qty 1

## 2019-08-14 MED ORDER — BOOST / RESOURCE BREEZE PO LIQD CUSTOM
1.0000 | Freq: Three times a day (TID) | ORAL | Status: DC
  Administered 2019-08-14 – 2019-08-22 (×23): 1 via ORAL

## 2019-08-14 MED ORDER — IOHEXOL 300 MG/ML  SOLN
150.0000 mL | Freq: Once | INTRAMUSCULAR | Status: AC | PRN
  Administered 2019-08-14: 75 mL via ORAL

## 2019-08-14 MED ORDER — QUETIAPINE FUMARATE 50 MG PO TABS
100.0000 mg | ORAL_TABLET | Freq: Every day | ORAL | Status: DC
  Administered 2019-08-14 – 2019-08-21 (×7): 100 mg via ORAL
  Filled 2019-08-14 (×8): qty 2

## 2019-08-14 MED ORDER — OXYCODONE HCL 5 MG/5ML PO SOLN
5.0000 mg | Freq: Four times a day (QID) | ORAL | Status: DC | PRN
  Administered 2019-08-14 – 2019-08-16 (×5): 10 mg via ORAL
  Filled 2019-08-14 (×5): qty 10

## 2019-08-14 NOTE — Progress Notes (Signed)
Progress Note: General Surgery Service   Chief Complaint/Subjective: Pain on left abdomen moderate, excited to start drinking  Objective: Vital signs in last 24 hours: Temp:  [96.3 F (35.7 C)-99 F (37.2 C)] 98.6 F (37 C) (04/20 0937) Pulse Rate:  [66-92] 76 (04/20 0937) Resp:  [11-18] 17 (04/20 0937) BP: (117-170)/(86-104) 121/91 (04/20 0937) SpO2:  [100 %] 100 % (04/20 0937) Last BM Date: 08/12/19  Intake/Output from previous day: 04/19 0701 - 04/20 0700 In: 4000 [I.V.:3650; IV Piggyback:350] Out: 2250 [Urine:1550; Drains:550; Blood:150] Intake/Output this shift: Total I/O In: -  Out: 330 [Urine:200; Drains:130]  Gen: NAD  Resp: nonlabored  Card: RRR  Abd: soft, incisions appropriately tender, bandages dry  Lab Results: CBC  Recent Labs    08/13/19 1222 08/14/19 0436  WBC  --  10.2  HGB 10.2* 8.3*  HCT 32.3* 25.6*  PLT  --  258   BMET Recent Labs    08/14/19 0436  NA 133*  K 4.6  CL 102  CO2 22  GLUCOSE 125*  BUN 14  CREATININE 0.84  CALCIUM 8.5*   PT/INR No results for input(s): LABPROT, INR in the last 72 hours. ABG No results for input(s): PHART, HCO3 in the last 72 hours.  Invalid input(s): PCO2, PO2  Anti-infectives: Anti-infectives (From admission, onward)   Start     Dose/Rate Route Frequency Ordered Stop   08/13/19 0600  cefoTEtan (CEFOTAN) 2 g in sodium chloride 0.9 % 100 mL IVPB     2 g 200 mL/hr over 30 Minutes Intravenous On call to O.R. 08/13/19 0537 08/13/19 0718      Medications: Scheduled Meds: . acetaminophen  1,000 mg Oral Q8H   Or  . acetaminophen (TYLENOL) oral liquid 160 mg/5 mL  1,000 mg Oral Q8H  . enoxaparin (LOVENOX) injection  30 mg Subcutaneous Q12H  . feeding supplement  1 Container Oral TID BM   Continuous Infusions: . dextrose 5 % and 0.45% NaCl 100 mL/hr at 08/13/19 1955  . famotidine (PEPCID) IV 20 mg (08/13/19 2131)   PRN Meds:.HYDROmorphone (DILAUDID) injection, metoprolol tartrate, ondansetron  (ZOFRAN) IV, oxyCODONE, simethicone  Assessment/Plan: s/p Procedure(s): LAPAROSCOPIC REVISION OF ROUX EN Y WITH UPPER ENDOSCOPY; REMOVAL GASTROSTOMY TUBE, TAKEDOWN OF GASTROJEJUNOSTOMY; RESECTION OF SMALL INTESTINE ERAS PATHWAY 08/13/2019 UGI negative for leak, anastomosis widely patent and drains well. Hemoglobin drop from 10-8 postop mixed dilutional or blood loss -clear liquids today with breeze protein -change pain control to dilaudid -up to chair today -repeat H+H this afternoon   LOS: 1 day   [name], MD [facility]

## 2019-08-14 NOTE — Progress Notes (Signed)
Concerned with current pain medication, plans to discuss with Dr Kieth Brightly.  UGI ordered for am.  NPO at this time. JP emptied by NT.  Some blood noted on bed pad and bloody drainage from JP drain.  No identified needs at this time.

## 2019-08-15 ENCOUNTER — Inpatient Hospital Stay (HOSPITAL_COMMUNITY): Payer: Medicare Other

## 2019-08-15 LAB — VITAMIN D 25 HYDROXY (VIT D DEFICIENCY, FRACTURES): Vit D, 25-Hydroxy: 9.32 ng/mL — ABNORMAL LOW (ref 30–100)

## 2019-08-15 LAB — CBC WITH DIFFERENTIAL/PLATELET
Abs Immature Granulocytes: 0.06 10*3/uL (ref 0.00–0.07)
Basophils Absolute: 0 10*3/uL (ref 0.0–0.1)
Basophils Relative: 0 %
Eosinophils Absolute: 0.2 10*3/uL (ref 0.0–0.5)
Eosinophils Relative: 5 %
HCT: 21.2 % — ABNORMAL LOW (ref 36.0–46.0)
Hemoglobin: 7 g/dL — ABNORMAL LOW (ref 12.0–15.0)
Immature Granulocytes: 1 %
Lymphocytes Relative: 17 %
Lymphs Abs: 0.8 10*3/uL (ref 0.7–4.0)
MCH: 30.4 pg (ref 26.0–34.0)
MCHC: 33 g/dL (ref 30.0–36.0)
MCV: 92.2 fL (ref 80.0–100.0)
Monocytes Absolute: 0.3 10*3/uL (ref 0.1–1.0)
Monocytes Relative: 7 %
Neutro Abs: 3.1 10*3/uL (ref 1.7–7.7)
Neutrophils Relative %: 70 %
Platelets: 164 10*3/uL (ref 150–400)
RBC: 2.3 MIL/uL — ABNORMAL LOW (ref 3.87–5.11)
RDW: 13.1 % (ref 11.5–15.5)
WBC: 4.5 10*3/uL (ref 4.0–10.5)
nRBC: 0 % (ref 0.0–0.2)

## 2019-08-15 LAB — VITAMIN B12: Vitamin B-12: 923 pg/mL — ABNORMAL HIGH (ref 180–914)

## 2019-08-15 LAB — PROTIME-INR
INR: 1 (ref 0.8–1.2)
Prothrombin Time: 13.3 seconds (ref 11.4–15.2)

## 2019-08-15 LAB — SURGICAL PATHOLOGY

## 2019-08-15 LAB — BASIC METABOLIC PANEL
Anion gap: 8 (ref 5–15)
BUN: 10 mg/dL (ref 6–20)
CO2: 21 mmol/L — ABNORMAL LOW (ref 22–32)
Calcium: 8.1 mg/dL — ABNORMAL LOW (ref 8.9–10.3)
Chloride: 108 mmol/L (ref 98–111)
Creatinine, Ser: 0.66 mg/dL (ref 0.44–1.00)
GFR calc Af Amer: >60 mL/min (ref >60)
GFR calc non Af Amer: >60 mL/min (ref >60)
Glucose, Bld: 104 mg/dL — ABNORMAL HIGH (ref 70–99)
Potassium: 3.3 mmol/L — ABNORMAL LOW (ref 3.5–5.1)
Sodium: 137 mmol/L (ref 135–145)

## 2019-08-15 LAB — MAGNESIUM: Magnesium: 1.5 mg/dL — ABNORMAL LOW (ref 1.7–2.4)

## 2019-08-15 LAB — HEMOGLOBIN AND HEMATOCRIT, BLOOD
HCT: 36.1 % (ref 36.0–46.0)
Hemoglobin: 11.9 g/dL — ABNORMAL LOW (ref 12.0–15.0)

## 2019-08-15 LAB — PHOSPHORUS: Phosphorus: 3.4 mg/dL (ref 2.5–4.6)

## 2019-08-15 LAB — PREPARE RBC (CROSSMATCH)

## 2019-08-15 MED ORDER — THIAMINE HCL 100 MG/ML IJ SOLN
100.0000 mg | Freq: Every day | INTRAMUSCULAR | Status: DC
  Administered 2019-08-15 – 2019-08-19 (×5): 100 mg via INTRAVENOUS
  Filled 2019-08-15 (×5): qty 2

## 2019-08-15 MED ORDER — MAGNESIUM SULFATE 4 GM/100ML IV SOLN
4.0000 g | Freq: Once | INTRAVENOUS | Status: AC
  Administered 2019-08-15: 4 g via INTRAVENOUS
  Filled 2019-08-15: qty 100

## 2019-08-15 MED ORDER — SODIUM CHLORIDE 0.9% IV SOLUTION: Freq: Once | INTRAVENOUS | Status: AC

## 2019-08-15 MED ORDER — CALCIUM CARBONATE ANTACID 500 MG PO CHEW
1.0000 | CHEWABLE_TABLET | Freq: Two times a day (BID) | ORAL | Status: DC
  Administered 2019-08-15: 200 mg via ORAL
  Filled 2019-08-15: qty 1

## 2019-08-15 MED ORDER — POTASSIUM CHLORIDE 10 MEQ/100ML IV SOLN
10.0000 meq | INTRAVENOUS | Status: AC
  Administered 2019-08-16 (×2): 10 meq via INTRAVENOUS
  Filled 2019-08-15 (×2): qty 100

## 2019-08-15 NOTE — Progress Notes (Signed)
First unit of PRBC complete.

## 2019-08-15 NOTE — Progress Notes (Signed)
Progress Note: General Surgery Service   Chief Complaint/Subjective: More somnolent in last 2 hours. Answers questions ok but some slurring of speech.  Objective: Vital signs in last 24 hours: Temp:  [97.9 F (36.6 C)-98.6 F (37 C)] 97.9 F (36.6 C) (04/21 1100) Pulse Rate:  [78-98] 98 (04/21 1100) Resp:  [16-18] 16 (04/21 1100) BP: (110-148)/(71-98) 110/71 (04/21 1100) SpO2:  [100 %] 100 % (04/21 1100) Last BM Date: 08/15/19  Intake/Output from previous day: 04/20 0701 - 04/21 0700 In: 3315.3 [P.O.:1560; I.V.:1655.3; IV Piggyback:100] Out: 3370 [Urine:2700; Drains:670] Intake/Output this shift: Total I/O In: 290 [P.O.:240; I.V.:50] Out: -   Gen: NAD, difficulty maintaining alertness  Resp: nonlabored  Card: RRR  Abd: soft, incisions c/d/i, drain with serosanguinous output  Lab Results: CBC  Recent Labs    08/14/19 0436 08/14/19 0436 08/14/19 1314 08/15/19 0531  WBC 10.2  --   --  4.5  HGB 8.3*   < > 8.1* 7.0*  HCT 25.6*   < > 24.7* 21.2*  PLT 258  --   --  164   < > = values in this interval not displayed.   BMET Recent Labs    08/14/19 0436  NA 133*  K 4.6  CL 102  CO2 22  GLUCOSE 125*  BUN 14  CREATININE 0.84  CALCIUM 8.5*   PT/INR No results for input(s): LABPROT, INR in the last 72 hours. ABG No results for input(s): PHART, HCO3 in the last 72 hours.  Invalid input(s): PCO2, PO2  Anti-infectives: Anti-infectives (From admission, onward)   Start     Dose/Rate Route Frequency Ordered Stop   08/13/19 0600  cefoTEtan (CEFOTAN) 2 g in sodium chloride 0.9 % 100 mL IVPB     2 g 200 mL/hr over 30 Minutes Intravenous On call to O.R. 08/13/19 [id number] 08/13/19 [id number]      Medications: Scheduled Meds: . sodium chloride   Intravenous Once  . acetaminophen  1,000 mg Oral Q8H   Or  . acetaminophen (TYLENOL) oral liquid 160 mg/5 mL  1,000 mg Oral Q8H  . feeding supplement  1 Container Oral TID BM  . QUEtiapine  100 mg Oral QHS  . thiamine  injection  100 mg Intravenous Daily   Continuous Infusions: . dextrose 5 % and 0.45% NaCl 100 mL/hr at 08/14/19 2254  . famotidine (PEPCID) IV 20 mg (08/15/19 0959)   PRN Meds:.ALPRAZolam, cyclobenzaprine, HYDROmorphone (DILAUDID) injection, metoprolol tartrate, ondansetron (ZOFRAN) IV, oxyCODONE, simethicone  Assessment/Plan: s/p Procedure(s): LAPAROSCOPIC REVISION OF ROUX EN Y WITH UPPER ENDOSCOPY; REMOVAL GASTROSTOMY TUBE, TAKEDOWN OF GASTROJEJUNOSTOMY; RESECTION OF SMALL INTESTINE ERAS PATHWAY 08/13/2019 More somnolent this morning, Hemoglobin downtrending further. Drain does not appear to be frank blood, third spacing expected given long length of surgery -transfuse blood for symptomatic anemia in post op -vit labs and electrolytes and coags -empiric thiamine -CT head today -continuous pulse ox -if worsens, may transfer to higher level care    LOS: 2 days   [name], MD [facility]

## 2019-08-15 NOTE — Progress Notes (Signed)
Patient alert answering questions.  We discussed her earlier sleepiness that she attributes to not sleeping well night before.  Placed on bedpan.  Blood infusing at this time.  H&H to follow

## 2019-08-15 NOTE — Progress Notes (Signed)
Patient is becoming more lethargic since this mornings assessment. Dilaudid given @ [phone]. During MD rounds patient was found to doze off in the middle of conversation but still able to be aroused. AO x4. Patient attributes her lethargy to not sleeping well last night. PRBC started @ 1146. Vitals stable. Patient on pulse ox. Lab unable to get pre-admin draw. Lab informed RN that they will come back @ 1600.

## 2019-08-16 LAB — TYPE AND SCREEN
ABO/RH(D): B POS
Antibody Screen: NEGATIVE
Unit division: 0
Unit division: 0

## 2019-08-16 LAB — CBC
HCT: 31.3 % — ABNORMAL LOW (ref 36.0–46.0)
Hemoglobin: 9.9 g/dL — ABNORMAL LOW (ref 12.0–15.0)
MCH: 29.7 pg (ref 26.0–34.0)
MCHC: 31.6 g/dL (ref 30.0–36.0)
MCV: 94 fL (ref 80.0–100.0)
Platelets: 154 10*3/uL (ref 150–400)
RBC: 3.33 MIL/uL — ABNORMAL LOW (ref 3.87–5.11)
RDW: 16.1 % — ABNORMAL HIGH (ref 11.5–15.5)
WBC: 5.5 10*3/uL (ref 4.0–10.5)
nRBC: 0 % (ref 0.0–0.2)

## 2019-08-16 LAB — FOLATE RBC
Folate, Hemolysate: 320 ng/mL
Folate, RBC: 1036 ng/mL (ref >498)
Hematocrit: 30.9 % — ABNORMAL LOW (ref 34.0–46.6)

## 2019-08-16 LAB — BPAM RBC
Blood Product Expiration Date: 202105042359
Blood Product Expiration Date: 202105062359
ISSUE DATE / TIME: 202104211126
ISSUE DATE / TIME: 202104211445
Unit Type and Rh: 7300
Unit Type and Rh: 7300

## 2019-08-16 MED ORDER — CALCIUM CARBONATE-VITAMIN D 500-200 MG-UNIT PO TABS
1.0000 | ORAL_TABLET | Freq: Two times a day (BID) | ORAL | Status: DC
  Administered 2019-08-16 – 2019-08-22 (×13): 1 via ORAL
  Filled 2019-08-16 (×13): qty 1

## 2019-08-16 MED ORDER — OXYCODONE HCL 5 MG/5ML PO SOLN
5.0000 mg | ORAL | Status: DC | PRN
  Administered 2019-08-16 – 2019-08-17 (×5): 10 mg via ORAL
  Filled 2019-08-16 (×6): qty 10

## 2019-08-16 NOTE — Progress Notes (Signed)
Progress Note: General Surgery Service   Chief Complaint/Subjective: Pain issues overnight, spit up 2 times yesterday, multiple bowel movements yesterday, ambulating some  Objective: Vital signs in last 24 hours: Temp:  [97.5 F (36.4 C)-99 F (37.2 C)] 98.2 F (36.8 C) (04/22 1712) Pulse Rate:  [82-92] 92 (04/22 1712) Resp:  [16] 16 (04/22 1712) BP: (99-135)/(68-91) 108/80 (04/22 1712) SpO2:  [100 %] 100 % (04/22 1712) Last BM Date: 08/15/19  Intake/Output from previous day: 04/21 0701 - 04/22 0700 In: 3975.3 [P.O.:1230; I.V.:1475.2; Blood:920; IV Piggyback:350.1] Out: 910 [Urine:800; Drains:110] Intake/Output this shift: No intake/output data recorded.  Gen: NAD  Resp: nonlabored  Card: RRR  Abd: soft, appropriately tender, incisions c/d/i, drain with serosanguinous output  Lab Results: CBC  Recent Labs    08/15/19 0531 08/15/19 0531 08/15/19 1933 08/15/19 1959 08/16/19 0418  WBC 4.5  --   --   --  5.5  HGB 7.0*   < > 11.9*  --  9.9*  HCT 21.2*   < > 36.1 30.9* 31.3*  PLT 164  --   --   --  154   < > = values in this interval not displayed.   BMET Recent Labs    08/14/19 0436 08/15/19 1959  NA 133* 137  K 4.6 3.3*  CL 102 108  CO2 22 21*  GLUCOSE 125* 104*  BUN 14 10  CREATININE 0.84 0.66  CALCIUM 8.5* 8.1*   PT/INR Recent Labs    08/15/19 1959  LABPROT 13.3  INR 1.0   ABG No results for input(s): PHART, HCO3 in the last 72 hours.  Invalid input(s): PCO2, PO2  Anti-infectives: Anti-infectives (From admission, onward)   Start     Dose/Rate Route Frequency Ordered Stop   08/13/19 0600  cefoTEtan (CEFOTAN) 2 g in sodium chloride 0.9 % 100 mL IVPB     2 g 200 mL/hr over 30 Minutes Intravenous On call to O.R. 08/13/19 0537 08/13/19 [id number]      Medications: Scheduled Meds: . acetaminophen  1,000 mg Oral Q8H   Or  . acetaminophen (TYLENOL) oral liquid 160 mg/5 mL  1,000 mg Oral Q8H  . calcium-vitamin D  1 tablet Oral BID  . feeding  supplement  1 Container Oral TID BM  . QUEtiapine  100 mg Oral QHS  . thiamine injection  100 mg Intravenous Daily   Continuous Infusions: . dextrose 5 % and 0.45% NaCl 100 mL/hr at 08/16/19 1722  . 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 -full liquids today -ambulate -labs tomorrow -increase oxycodone frequency    LOS: 3 days   [name], MD [facility]

## 2019-08-16 NOTE — Care Management Important Message (Signed)
Important Message  Patient Details IM Letter given to Kathrin Greathouse SW Case Manager to present to the Patient Name: Angie [name] MRN: [id number] Date of Birth: Jan 31, 1966   Medicare Important Message Given:  Yes     Kerin Salen 08/16/2019, 12:00 PM

## 2019-08-16 NOTE — Progress Notes (Signed)
Patient sitting up in bed, requests pain medication.  Bedside RN made aware.  Patient shared she slept much better last night.  Denies nausea, had multiple bowel movements yesterday.  Excited to start full liquids.  Encouraged to ambulate and be in chair today.

## 2019-08-17 LAB — CBC
HCT: 32 % — ABNORMAL LOW (ref 36.0–46.0)
Hemoglobin: 10 g/dL — ABNORMAL LOW (ref 12.0–15.0)
MCH: 29.9 pg (ref 26.0–34.0)
MCHC: 31.3 g/dL (ref 30.0–36.0)
MCV: 95.8 fL (ref 80.0–100.0)
Platelets: 182 10*3/uL (ref 150–400)
RBC: 3.34 MIL/uL — ABNORMAL LOW (ref 3.87–5.11)
RDW: 15.9 % — ABNORMAL HIGH (ref 11.5–15.5)
WBC: 5.3 10*3/uL (ref 4.0–10.5)
nRBC: 0 % (ref 0.0–0.2)

## 2019-08-17 LAB — MAGNESIUM: Magnesium: 1.7 mg/dL (ref 1.7–2.4)

## 2019-08-17 LAB — BASIC METABOLIC PANEL
Anion gap: 8 (ref 5–15)
BUN: 7 mg/dL (ref 6–20)
CO2: 16 mmol/L — ABNORMAL LOW (ref 22–32)
Calcium: 8.1 mg/dL — ABNORMAL LOW (ref 8.9–10.3)
Chloride: 111 mmol/L (ref 98–111)
Creatinine, Ser: 0.84 mg/dL (ref 0.44–1.00)
GFR calc Af Amer: >60 mL/min (ref >60)
GFR calc non Af Amer: >60 mL/min (ref >60)
Glucose, Bld: 206 mg/dL — ABNORMAL HIGH (ref 70–99)
Potassium: 2.8 mmol/L — ABNORMAL LOW (ref 3.5–5.1)
Sodium: 135 mmol/L (ref 135–145)

## 2019-08-17 MED ORDER — MAGNESIUM SULFATE 2 GM/50ML IV SOLN
2.0000 g | Freq: Once | INTRAVENOUS | Status: AC
  Administered 2019-08-17: 2 g via INTRAVENOUS
  Filled 2019-08-17: qty 50

## 2019-08-17 MED ORDER — POTASSIUM CHLORIDE 10 MEQ/100ML IV SOLN
10.0000 meq | INTRAVENOUS | Status: AC
  Administered 2019-08-17 (×4): 10 meq via INTRAVENOUS
  Filled 2019-08-17 (×3): qty 100

## 2019-08-17 MED ORDER — OXYCODONE HCL 5 MG/5ML PO SOLN
10.0000 mg | ORAL | Status: DC | PRN
  Administered 2019-08-17 – 2019-08-20 (×7): 15 mg via ORAL
  Filled 2019-08-17 (×7): qty 15

## 2019-08-17 MED ORDER — POTASSIUM CHLORIDE 20 MEQ PO PACK
20.0000 meq | PACK | Freq: Two times a day (BID) | ORAL | Status: DC
  Administered 2019-08-17 – 2019-08-21 (×6): 20 meq via ORAL
  Filled 2019-08-17 (×11): qty 1

## 2019-08-17 NOTE — Progress Notes (Signed)
Pharmacy called for IV compatibility on famotidine and potassium. Verified by technician that it is compatible.

## 2019-08-17 NOTE — Progress Notes (Signed)
Progress Note: General Surgery Service   Chief Complaint/Subjective: Pain still significant. Tolerating liquids well. No diarrhea overnight.  Objective: Vital signs in last 24 hours: Temp:  [97.5 F (36.4 C)-98.7 F (37.1 C)] 97.5 F (36.4 C) (04/23 0918) Pulse Rate:  [63-92] 63 (04/23 0918) Resp:  [15-16] 16 (04/23 0625) BP: (91-110)/(60-80) 98/71 (04/23 0918) SpO2:  [100 %] 100 % (04/23 0918) Last BM Date: 08/16/19  Intake/Output from previous day: 04/22 0701 - 04/23 0700 In: 4356.8 [P.O.:2160; I.V.:2096.8; IV Piggyback:100] Out: 1875 [Urine:1800; Drains:75] Intake/Output this shift: Total I/O In: 240 [P.O.:240] Out: 870 [Urine:800; Drains:70]  Gen: NAD  Resp: nonlabored  Card: RRR  Abd: soft, ATTP, incisions c/d/i  Lab Results: CBC  Recent Labs    08/16/19 0418 08/17/19 0359  WBC 5.5 5.3  HGB 9.9* 10.0*  HCT 31.3* 32.0*  PLT 154 182   BMET Recent Labs    08/15/19 1959 08/17/19 0359  NA 137 135  K 3.3* 2.8*  CL 108 111  CO2 21* 16*  GLUCOSE 104* 206*  BUN 10 7  CREATININE 0.66 0.84  CALCIUM 8.1* 8.1*   PT/INR Recent Labs    08/15/19 1959  LABPROT 13.3  INR 1.0   ABG No results for input(s): PHART, HCO3 in the last 72 hours.  Invalid input(s): PCO2, PO2  Anti-infectives: Anti-infectives (From admission, onward)   Start     Dose/Rate Route Frequency Ordered Stop   08/13/19 0600  cefoTEtan (CEFOTAN) 2 g in sodium chloride 0.9 % 100 mL IVPB     2 g 200 mL/hr over 30 Minutes Intravenous On call to O.R. 08/13/19 0537 08/13/19 [id number]      Medications: Scheduled Meds: . acetaminophen  1,000 mg Oral Q8H   Or  . acetaminophen (TYLENOL) oral liquid 160 mg/5 mL  1,000 mg Oral Q8H  . calcium-vitamin D  1 tablet Oral BID  . feeding supplement  1 Container Oral TID BM  . potassium chloride  20 mEq Oral BID  . QUEtiapine  100 mg Oral QHS  . thiamine injection  100 mg Intravenous Daily   Continuous Infusions: . dextrose 5 % and 0.45% NaCl 100  mL/hr at 08/17/19 0334  . famotidine (PEPCID) IV 20 mg (08/17/19 0914)  . magnesium sulfate bolus IVPB 2 g (08/17/19 1149)  . 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

## 2019-08-17 NOTE — Progress Notes (Signed)
Rounded on patient, awaiting IV pain medication.  Walked in hallway x 2 yesterday and spent time in chair in the afternoon.  We discussed continuing to mobilize and out of bed as much as possible.  Full liquids going well per patient, denies nausea at this time.  Discussed nutrition with [facility] [place] [facility] Glassing [address] RD) who will follow nutrition needs post discharge.  Follow up appointments with surgeon obtained and placed on discharge paperwork when patient meets criteria for discharge. Provided contact information for Bariatric Nurse Coordinator for any future resources.

## 2019-08-18 LAB — CBC
HCT: 29.4 % — ABNORMAL LOW (ref 36.0–46.0)
Hemoglobin: 9.3 g/dL — ABNORMAL LOW (ref 12.0–15.0)
MCH: 30.1 pg (ref 26.0–34.0)
MCHC: 31.6 g/dL (ref 30.0–36.0)
MCV: 95.1 fL (ref 80.0–100.0)
Platelets: 211 10*3/uL (ref 150–400)
RBC: 3.09 MIL/uL — ABNORMAL LOW (ref 3.87–5.11)
RDW: 15.2 % (ref 11.5–15.5)
WBC: 6.2 10*3/uL (ref 4.0–10.5)
nRBC: 0 % (ref 0.0–0.2)

## 2019-08-18 LAB — BASIC METABOLIC PANEL
Anion gap: 6 (ref 5–15)
BUN: 6 mg/dL (ref 6–20)
CO2: 16 mmol/L — ABNORMAL LOW (ref 22–32)
Calcium: 8.4 mg/dL — ABNORMAL LOW (ref 8.9–10.3)
Chloride: 115 mmol/L — ABNORMAL HIGH (ref 98–111)
Creatinine, Ser: 0.66 mg/dL (ref 0.44–1.00)
GFR calc Af Amer: >60 mL/min (ref >60)
GFR calc non Af Amer: >60 mL/min (ref >60)
Glucose, Bld: 98 mg/dL (ref 70–99)
Potassium: 4.1 mmol/L (ref 3.5–5.1)
Sodium: 137 mmol/L (ref 135–145)

## 2019-08-18 LAB — MAGNESIUM: Magnesium: 1.7 mg/dL (ref 1.7–2.4)

## 2019-08-18 MED ORDER — HYDROMORPHONE HCL 1 MG/ML IJ SOLN
1.0000 mg | INTRAMUSCULAR | Status: DC | PRN
  Administered 2019-08-18 – 2019-08-20 (×12): 1 mg via INTRAVENOUS
  Filled 2019-08-18 (×12): qty 1

## 2019-08-18 MED ORDER — HYDROMORPHONE HCL 1 MG/ML IJ SOLN
1.0000 mg | INTRAMUSCULAR | Status: DC | PRN
  Filled 2019-08-18: qty 1

## 2019-08-18 MED ORDER — KCL IN DEXTROSE-NACL 20-5-0.45 MEQ/L-%-% IV SOLN
INTRAVENOUS | Status: DC
  Filled 2019-08-18 (×4): qty 1000

## 2019-08-18 NOTE — Progress Notes (Addendum)
[facility] Office:  [phone] General Surgery Progress Note   LOS: 6 days  POD -  6 Days Post-Op  Assessment and Plan: 1.  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  History of narcotic use/pain issues  Getting frequent Dilaudid 3.  DVT prophylaxis - start SQ Heparin 4.  Anemia  Hgb - 10.9 - 08/19/2019   Active Problems:   Protein calorie malnutrition (HCC)  Subjective:  Doing okay.  Needs her pain meds frequently  Objective:   Vitals:   08/18/19 2223 08/19/19 0609  BP: 90/69 (!) 96/59  Pulse: 84 99  Resp: 15 15  Temp: 98.1 F (36.7 C) 99 F (37.2 C)  SpO2: 100% 100%     Intake/Output from previous day:  04/24 0701 - 04/25 0700 In: 3078.3 [P.O.:1120; I.V.:1858.2; IV Piggyback:100] Out: [id number] [Urine:3100; Drains:230]  Intake/Output this shift:  Total I/O In: 240 [P.O.:240] Out: 660 [Urine:600; Drains:60]   Physical Exam:   General: Short AA F who is alert and oriented.    HEENT: Normal. Pupils equal. .   Lungs: Clear   Abdomen: Soft.   Wound: Clean.  Drain LUQ - 230 cc recorded yesterday.   Lab Results:    Recent Labs    08/18/19 0418 08/19/19 0519  WBC 6.2 8.9  HGB 9.3* 10.9*  HCT 29.4* 35.0*  PLT 211 216    BMET   Recent Labs    08/18/19 0418 08/19/19 0519  NA 137 136  K 4.1 4.3  CL 115* 110  CO2 16* 20*  GLUCOSE 98 105*  BUN 6 7  CREATININE 0.66 0.89  CALCIUM 8.4* 8.6*    PT/INR   No results for input(s): LABPROT, INR in the last 72 hours.  ABG  No results for input(s): PHART, HCO3 in the last 72 hours.  Invalid input(s): PCO2, PO2   Studies/Results:  No results found.   Anti-infectives:   Anti-infectives (From admission, onward)   Start     Dose/Rate Route Frequency Ordered Stop   08/13/19 0600  cefoTEtan (CEFOTAN) 2 g in sodium chloride  0.9 % 100 mL IVPB     2 g 200 mL/hr over 30 Minutes Intravenous On call to O.R. 08/13/19 0537 08/13/19 [id number]      Alphonsa Overall, MD, [facility] Office: [phone] 08/19/2019

## 2019-08-19 LAB — BASIC METABOLIC PANEL
Anion gap: 6 (ref 5–15)
BUN: 7 mg/dL (ref 6–20)
CO2: 20 mmol/L — ABNORMAL LOW (ref 22–32)
Calcium: 8.6 mg/dL — ABNORMAL LOW (ref 8.9–10.3)
Chloride: 110 mmol/L (ref 98–111)
Creatinine, Ser: 0.89 mg/dL (ref 0.44–1.00)
GFR calc Af Amer: >60 mL/min (ref >60)
GFR calc non Af Amer: >60 mL/min (ref >60)
Glucose, Bld: 105 mg/dL — ABNORMAL HIGH (ref 70–99)
Potassium: 4.3 mmol/L (ref 3.5–5.1)
Sodium: 136 mmol/L (ref 135–145)

## 2019-08-19 LAB — CBC WITH DIFFERENTIAL/PLATELET
Abs Immature Granulocytes: 0.07 10*3/uL (ref 0.00–0.07)
Basophils Absolute: 0 10*3/uL (ref 0.0–0.1)
Basophils Relative: 0 %
Eosinophils Absolute: 0.5 10*3/uL (ref 0.0–0.5)
Eosinophils Relative: 6 %
HCT: 35 % — ABNORMAL LOW (ref 36.0–46.0)
Hemoglobin: 10.9 g/dL — ABNORMAL LOW (ref 12.0–15.0)
Immature Granulocytes: 1 %
Lymphocytes Relative: 9 %
Lymphs Abs: 0.8 10*3/uL (ref 0.7–4.0)
MCH: 29.9 pg (ref 26.0–34.0)
MCHC: 31.1 g/dL (ref 30.0–36.0)
MCV: 96.2 fL (ref 80.0–100.0)
Monocytes Absolute: 0.8 10*3/uL (ref 0.1–1.0)
Monocytes Relative: 9 %
Neutro Abs: 6.6 10*3/uL (ref 1.7–7.7)
Neutrophils Relative %: 75 %
Platelets: 216 10*3/uL (ref 150–400)
RBC: 3.64 MIL/uL — ABNORMAL LOW (ref 3.87–5.11)
RDW: 15.1 % (ref 11.5–15.5)
WBC: 8.9 10*3/uL (ref 4.0–10.5)
nRBC: 0 % (ref 0.0–0.2)

## 2019-08-19 MED ORDER — HEPARIN SODIUM (PORCINE) 5000 UNIT/ML IJ SOLN
5000.0000 [IU] | Freq: Three times a day (TID) | INTRAMUSCULAR | Status: DC
  Administered 2019-08-19 – 2019-08-22 (×9): 5000 [IU] via SUBCUTANEOUS
  Filled 2019-08-19 (×9): qty 1

## 2019-08-19 NOTE — Progress Notes (Signed)
Pt said her daughter brought her some stuff after 8 pm so her daughter unable to came to the floor  After visiting hours. Nurse secretory went to ED and picked up her   Stuff.  This RN and  Nurse secretory went to pt's room with her belongings. There was [employer] plastic bag  And small stuff inside ,pt said there is pair of socks,underwear and some money, kind of suspicious. Get permissions with pt to check her belongings  Bag. Found one pair of socks and one blue pill with M and 3 on side. There is no underwear or money. Made pt aware she can't have that pill and  it is not safe to have other medicine from home while she is admitting in hospital. Pt verbalized she did not know her daughter bring her that medicine . Probably accidentally get to the bag. Took that medicine from pt's room and placed in to her chart with the level. Notified to [facility] and on call MD Newmal,David.

## 2019-08-20 LAB — BASIC METABOLIC PANEL
Anion gap: 6 (ref 5–15)
BUN: 7 mg/dL (ref 6–20)
CO2: 16 mmol/L — ABNORMAL LOW (ref 22–32)
Calcium: 8.3 mg/dL — ABNORMAL LOW (ref 8.9–10.3)
Chloride: 114 mmol/L — ABNORMAL HIGH (ref 98–111)
Creatinine, Ser: 0.76 mg/dL (ref 0.44–1.00)
GFR calc Af Amer: >60 mL/min (ref >60)
GFR calc non Af Amer: >60 mL/min (ref >60)
Glucose, Bld: 96 mg/dL (ref 70–99)
Potassium: 4.2 mmol/L (ref 3.5–5.1)
Sodium: 136 mmol/L (ref 135–145)

## 2019-08-20 LAB — VITAMIN B1: Vitamin B1 (Thiamine): 215.5 nmol/L — ABNORMAL HIGH (ref 66.5–200.0)

## 2019-08-20 MED ORDER — ADULT MULTIVITAMIN W/MINERALS CH
1.0000 | ORAL_TABLET | Freq: Every day | ORAL | Status: DC
  Administered 2019-08-20 – 2019-08-22 (×3): 1 via ORAL
  Filled 2019-08-20 (×3): qty 1

## 2019-08-20 MED ORDER — OXYCODONE HCL 5 MG PO TABS
15.0000 mg | ORAL_TABLET | ORAL | Status: DC | PRN
  Administered 2019-08-20 – 2019-08-22 (×11): 15 mg via ORAL
  Filled 2019-08-20 (×11): qty 3

## 2019-08-20 MED ORDER — HYDROMORPHONE HCL 1 MG/ML IJ SOLN
1.0000 mg | INTRAMUSCULAR | Status: DC | PRN
  Administered 2019-08-20 (×2): 1 mg via INTRAVENOUS
  Filled 2019-08-20 (×3): qty 1

## 2019-08-20 MED ORDER — PANTOPRAZOLE SODIUM 40 MG PO TBEC
40.0000 mg | DELAYED_RELEASE_TABLET | Freq: Every day | ORAL | Status: DC
  Administered 2019-08-20 – 2019-08-22 (×3): 40 mg via ORAL
  Filled 2019-08-20 (×3): qty 1

## 2019-08-20 NOTE — Progress Notes (Signed)
Resting in bed, no complaints. Mobilized, denies nausea.  No needs identified.

## 2019-08-20 NOTE — Care Management Important Message (Signed)
Important Message  Patient Details IM Letter given to Kathrin Greathouse SW Case Manager to present to the Patient Name: Angie [name] MRN: [id number] Date of Birth: 1966/03/23   Medicare Important Message Given:  Yes     Kerin Salen 08/20/2019, 12:33 PM

## 2019-08-20 NOTE — Progress Notes (Signed)
Progress Note: General Surgery Service   Chief Complaint/Subjective: Tolerating diet, ambulated to nurses station yesterday, no diarrhea  Objective: Vital signs in last 24 hours: Temp:  [97.4 F (36.3 C)-99.4 F (37.4 C)] 97.4 F (36.3 C) (04/26 0910) Pulse Rate:  [86-97] 86 (04/26 0910) Resp:  [15-18] 15 (04/26 0910) BP: (96-125)/(63-87) 102/73 (04/26 0910) SpO2:  [100 %] 100 % (04/26 0910) Last BM Date: 08/19/19  Intake/Output from previous day: 04/25 0701 - 04/26 0700 In: 3791.6 [P.O.:1960; I.V.:1731.6; IV Piggyback:100] Out: 3160 [Urine:2950; Drains:210] Intake/Output this shift: Total I/O In: 240 [P.O.:240] Out: 1540 [Urine:1500; Drains:40]  Gen: NAD  Resp: nonlabored  Card: RRR  Abd: soft, incisions c/d/i  Lab Results: CBC  Recent Labs    08/18/19 0418 08/19/19 0519  WBC 6.2 8.9  HGB 9.3* 10.9*  HCT 29.4* 35.0*  PLT 211 216   BMET Recent Labs    08/19/19 0519 08/20/19 0434  NA 136 136  K 4.3 4.2  CL 110 114*  CO2 20* 16*  GLUCOSE 105* 96  BUN 7 7  CREATININE 0.89 0.76  CALCIUM 8.6* 8.3*   PT/INR No results for input(s): LABPROT, INR in the last 72 hours. ABG No results for input(s): PHART, HCO3 in the last 72 hours.  Invalid input(s): PCO2, PO2  Anti-infectives: Anti-infectives (From admission, onward)   Start     Dose/Rate Route Frequency Ordered Stop   08/13/19 0600  cefoTEtan (CEFOTAN) 2 g in sodium chloride 0.9 % 100 mL IVPB     2 g 200 mL/hr over 30 Minutes Intravenous On call to O.R. 08/13/19 0537 08/13/19 [id number]      Medications: Scheduled Meds: . acetaminophen  1,000 mg Oral Q8H   Or  . acetaminophen (TYLENOL) oral liquid 160 mg/5 mL  1,000 mg Oral Q8H  . calcium-vitamin D  1 tablet Oral BID  . feeding supplement  1 Container Oral TID BM  . heparin injection (subcutaneous)  5,000 Units Subcutaneous Q8H  . multivitamin with minerals  1 tablet Oral Daily  . pantoprazole  40 mg Oral Daily  . potassium chloride  20 mEq Oral  BID  . 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 Will give multivitamin in hospital and monitor for issue given that she is tolerating food containing iron    LOS: 7 days   [name], MD [facility]

## 2019-08-20 NOTE — Evaluation (Signed)
Physical Therapy Evaluation Patient Details Name: Angie [name] MRN: [id number] DOB: 01-Sep-1965 Today's Date: 08/20/2019   History of Present Illness  Patient is 54 y.o. 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 Patient is now s/p laparoscopic revision of roux-en-y bypass with upper endoscopy, removal of gastrotomy tube, takedown of gastrojejunostomy, and resection of small intestine on 08/13/19. Prior to most recent surgery pt had been g-tube dependent for ~ 1 year.    Clinical Impression  Angie [name] is 54 y.o. female admitted with above HPI and diagnosis. Patient is currently limited by functional impairments below (see PT problem list). Patient lives with her fiance and son and reports independence with mobility at baseline with occasional assistance required for ADL's. Patient will benefit from continued skilled PT interventions to address impairments and progress independence with mobility, recommending HHPT. Acute PT will follow and progress as able.     Follow Up Recommendations Home health PT    Equipment Recommendations  None recommended by PT(pt has RW at home)    Recommendations for Other Services OT consult     Precautions / Restrictions Precautions Precautions: Fall Restrictions Weight Bearing Restrictions: No      Mobility  Bed Mobility Overal bed mobility: Needs Assistance Bed Mobility: Supine to Sit;Sit to Supine     Supine to sit: Supervision;HOB elevated Sit to supine: Supervision;HOB elevated   General bed mobility comments: pt slow to mobilize. no assist required.  Transfers Overall transfer level: Needs assistance Equipment used: Rolling walker (2 wheeled) Transfers: Sit to/from Stand Sit to Stand: Min guard         General transfer comment: no cues needed for safe hand placement, pt slow to initiate power up but able to complete without assist, min guard for safety.  Ambulation/Gait Ambulation/Gait  assistance: Min guard Gait Distance (Feet): 75 Feet Assistive device: Rolling walker (2 wheeled) Gait Pattern/deviations: Step-through pattern;Decreased step length - right;Decreased step length - left;Decreased stride length;Trunk flexed(knees flexed) Gait velocity: decreased   General Gait Details: pt remained close to RW throughout gait. no overt LOB noted, min guard for safety.  Stairs            Wheelchair Mobility    Modified Rankin (Stroke Patients Only)       Balance Overall balance assessment: Needs assistance Sitting-balance support: Feet supported Sitting balance-Leahy Scale: Good     Standing balance support: During functional activity;Bilateral upper extremity supported Standing balance-Leahy Scale: Fair                               Pertinent Vitals/Pain Pain Assessment: Faces Faces Pain Scale: Hurts a little bit Pain Location: abdomen Pain Descriptors / Indicators: Discomfort;Guarding Pain Intervention(s): Limited activity within patient's tolerance;Monitored during session;Repositioned    Home Living Family/patient expects to be discharged to:: Private residence Living Arrangements: Spouse/significant other;Children Available Help at Discharge: Family;Available PRN/intermittently Type of Home: House Home Access: Stairs to enter Entrance Stairs-Rails: Psychiatric nurse of Steps: 5 Home Layout: One level(sunk in den and [employer]) Home Equipment: Environmental consultant - 2 wheels;Cane - single point;Bedside commode Additional Comments: pt lives with her fiance and 67 yo son. Fiance does not work. Her older children help out too if needed    Prior Function Level of Independence: Needs assistance   Gait / Transfers Assistance Needed: Pt ambulating with RW  ADL's / Homemaking Assistance Needed: reports independent with ADLs, IADLs but recently needing some  assist   Comments: ned help with dressing putting bra on. finace diabled vet as  well     Hand Dominance   Dominant Hand: Right    Extremity/Trunk Assessment   Upper Extremity Assessment Upper Extremity Assessment: Overall WFL for tasks assessed    Lower Extremity Assessment Lower Extremity Assessment: Generalized weakness    Cervical / Trunk Assessment Cervical / Trunk Assessment: Kyphotic  Communication   Communication: No difficulties  Cognition Arousal/Alertness: Awake/alert Behavior During Therapy: WFL for tasks assessed/performed Overall Cognitive Status: Within Functional Limits for tasks assessed                                        General Comments      Exercises     Assessment/Plan    PT Assessment Patient needs continued PT services  PT Problem List Decreased strength;Decreased range of motion;Decreased activity tolerance;Decreased balance;Decreased mobility;Decreased knowledge of use of DME;Decreased safety awareness       PT Treatment Interventions DME instruction;Gait training;Stair training;Functional mobility training;Therapeutic activities;Therapeutic exercise;Balance training;Patient/family education    PT Goals (Current goals can be found in the Care Plan section)  Acute Rehab PT Goals Patient Stated Goal: to get strength back PT Goal Formulation: With patient Time For Goal Achievement: 08/27/19 Potential to Achieve Goals: Good    Frequency Min 3X/week   Barriers to discharge        Co-evaluation               AM-PAC PT "6 Clicks" Mobility  Outcome Measure Help needed turning from your back to your side while in a flat bed without using bedrails?: None Help needed moving from lying on your back to sitting on the side of a flat bed without using bedrails?: None Help needed moving to and from a bed to a chair (including a wheelchair)?: A Little Help needed standing up from a chair using your arms (e.g., wheelchair or bedside chair)?: A Little Help needed to walk in hospital room?: A  Little Help needed climbing 3-5 steps with a railing? : A Little 6 Click Score: 20    End of Session Equipment Utilized During Treatment: Gait belt Activity Tolerance: Patient tolerated treatment well Patient left: in bed;with call bell/phone within reach Nurse Communication: Mobility status PT Visit Diagnosis: Unsteadiness on feet (R26.81);Muscle weakness (generalized) (M62.81);Difficulty in walking, not elsewhere classified (R26.2)    Time: [id number] PT Time Calculation (min) (ACUTE ONLY): 22 min   Charges:   PT Evaluation $PT Eval Moderate Complexity: 1 Mod         Gwynneth Albright PT, DPT Physical Therapist with [facility] [phone]  08/20/2019 2:27 PM

## 2019-08-21 NOTE — Evaluation (Signed)
Occupational Therapy Evaluation Patient Details Name: Angie [name] MRN: [id number] DOB: 21-Mar-1966 Today's Date: 08/21/2019    History of Present Illness Patient is 54 y.o. 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 Patient is now s/p laparoscopic revision of roux-en-y bypass with upper endoscopy, removal of gastrotomy tube, takedown of gastrojejunostomy, and resection of small intestine on 08/13/19. Prior to most recent surgery pt had been g-tube dependent for ~ 1 year.   Clinical Impression   Patient with functional deficits listed below impacting safety and independence with self care. Patient overall supervision to min guard with functional transfers and ambulation with rolling walker. Provided education on walker use with ambulation as patient was keeping frame very close to her body. Patient ambulate approximately 163ft with increased time and min guard assist. Initiated education on energy conservation strategies to implement at home to maximize safety, pt verbalize understanding. Will continue to follow.    Follow Up Recommendations  Home health OT;Other (comment)(vs home pending progress)    Equipment Recommendations  None recommended by OT;Other (comment)(pt has necessary DME)       Precautions / Restrictions Precautions Precautions: Fall Restrictions Weight Bearing Restrictions: No      Mobility Bed Mobility Overal bed mobility: Modified Independent             General bed mobility comments: pt slow to mobilize. no assist required.  Transfers Overall transfer level: Needs assistance Equipment used: Rolling walker (2 wheeled) Transfers: Sit to/from Stand Sit to Stand: Supervision;Min guard         General transfer comment: increased time to initiate, no physical assist needed    Balance Overall balance assessment: Needs assistance Sitting-balance support: Feet supported Sitting balance-Leahy Scale: Good     Standing  balance support: During functional activity;Bilateral upper extremity supported Standing balance-Leahy Scale: Fair Standing balance comment: able to maintain standing without external support however increased safety with walker                           ADL either performed or assessed with clinical judgement   ADL Overall ADL's : Needs assistance/impaired     Grooming: Wash/dry hands;Supervision/safety;Standing   Upper Body Bathing: Set up;Sitting   Lower Body Bathing: Sitting/lateral leans;Set up;Bed level;Sit to/from stand   Upper Body Dressing : Sitting;Set up   Lower Body Dressing: Set up;Bed level Lower Body Dressing Details (indicate cue type and reason): patient able to don socks in long sitting, pt states she does this at home as sitting at edge of bed makes it too far to reach feet Toilet Transfer: Supervision/safety;Min guard;Ambulation;BSC;RW   Toileting- Clothing Manipulation and Hygiene: Independent;Sitting/lateral lean       Functional mobility during ADLs: Min guard;Rolling walker                    Pertinent Vitals/Pain Pain Assessment: Faces Faces Pain Scale: Hurts even more Pain Location: abdomen Pain Descriptors / Indicators: Discomfort;Guarding Pain Intervention(s): Monitored during session     Hand Dominance Right   Extremity/Trunk Assessment Upper Extremity Assessment Upper Extremity Assessment: Generalized weakness   Lower Extremity Assessment Lower Extremity Assessment: Defer to PT evaluation   Cervical / Trunk Assessment Cervical / Trunk Assessment: Kyphotic   Communication Communication Communication: No difficulties   Cognition Arousal/Alertness: Awake/alert Behavior During Therapy: WFL for tasks assessed/performed Overall Cognitive Status: Within Functional Limits for tasks assessed  General Comments  initiated energy conservation strategies with patient to  maximize safety at home            [place] expects to be discharged to:: Private residence Living Arrangements: Spouse/significant other;Children Available Help at Discharge: Family;Available 24 hours/day Type of Home: House Home Access: Stairs to enter [employer] of Steps: 5 Entrance Stairs-Rails: Right;Left Home Layout: One level(sunk in den/laundry )     Bathroom Shower/Tub: Walk-in Psychologist, prison and probation services: Standard     Home Equipment: Environmental consultant - 2 wheels;Cane - single point;Bedside commode   Additional Comments: pt lives with her fiance and 67 yo son. Fiance does not work. Her older children help out too if needed      Prior Functioning/Environment Level of Independence: Needs assistance  Gait / Transfers Assistance Needed: Pt ambulating with RW ADL's / Homemaking Assistance Needed: reports independent with ADLs, IADLs but recently needing some assist    Comments: needs help with dressing occasionally such as donning bra or shoes        OT Problem List: Decreased activity tolerance;Pain;Decreased strength      OT Treatment/Interventions: Self-care/ADL training;Therapeutic exercise;Energy conservation;DME and/or AE instruction;Therapeutic activities;Patient/family education;Balance training    OT Goals(Current goals can be found in the care plan section) Acute Rehab OT Goals Patient Stated Goal: to get strength back OT Goal Formulation: With patient Time For Goal Achievement: 09/04/19 Potential to Achieve Goals: Good  OT Frequency: Min 2X/week    AM-PAC OT "6 Clicks" Daily Activity     Outcome Measure Help from another person eating meals?: None Help from another person taking care of personal grooming?: A Little Help from another person toileting, which includes using toliet, bedpan, or urinal?: A Little Help from another person bathing (including washing, rinsing, drying)?: A Little Help from another person to put on and taking  off regular upper body clothing?: A Little Help from another person to put on and taking off regular lower body clothing?: A Little 6 Click Score: 19   End of Session Equipment Utilized During Treatment: Rolling walker  Activity Tolerance: Patient tolerated treatment well Patient left: in chair;with call bell/phone within reach  OT Visit Diagnosis: Other abnormalities of gait and mobility (R26.89);Muscle weakness (generalized) (M62.81);Pain Pain - part of body: (abdomen)                Time: [id number] OT Time Calculation (min): 34 min Charges:  OT General Charges $OT Visit: 1 Visit OT Evaluation $OT Eval Moderate Complexity: 1 Mod OT Treatments $Self Care/Home Management : 8-22 mins  Delbert Phenix OT Pager: [place] 08/21/2019, 11:40 AM

## 2019-08-21 NOTE — Progress Notes (Signed)
Patient walked in hallway x2.  Expressed she was tired this afternoon, but was glad she was up with staff.  Has chicken sandwich for lunch.  Denies nausea when eating.  No complaint of pain at visit.  SW requested order for PT/OT reached out to Dr Kieth Brightly for orders.  Bedside RN aware.

## 2019-08-21 NOTE — TOC Initial Note (Signed)
Transition of Care [facility]) - Initial/Assessment Note    Patient Details  Name: Angie [name] MRN: [id number] Date of Birth: [birth year]  [facility]) CM/SW Contact:    Lia Hopping, [place] Phone Number: 08/21/2019, 10:34 AM  Clinical Narrative:                 CSW met with the patient at beside to discuss home health PT/OT. Patient agreeable to services. CSW offered choice. Patient reports using [facility] in the past and has requested to use the services again. CSW reached out to the agency and confirm staff availability. Patient reports she is independent with her ADL's.  Patient has a RW.  No other needs identified. CSW signing off.    Expected Discharge Plan: [place] Barriers to Discharge: No Barriers Identified   Patient Goals and CMS Choice Patient states their goals for this hospitalization and ongoing recovery are:: "go home with spouse and son" CMS Medicare.gov Compare Post Acute Care list provided to:: Patient Choice offered to / list presented to : Patient  Expected Discharge Plan and Services Expected Discharge Plan: [place] In-house Referral: NA Discharge Planning Services: NA Post Acute Care Choice: [place] arrangements for the past 2 months: Single Family Home                 DME Arranged: N/A(Has RW) DME Agency: NA       HH Arranged: PT, OT [place] Agency: [place] ([place]) Date HH Agency Contacted: 08/21/19 Time [place]: 54 Representative spoke with at [place]: Santiago Glad  Prior Living Arrangements/Services Living arrangements for the past 2 months: [place] with:: Minor Children, Spouse Patient language and need for interpreter reviewed:: No Do you feel safe going back to the place where you live?: Yes      Need for Family Participation in Patient Care: Yes (Comment) Care giver support system in place?: Yes (comment) Current home  services: DME Criminal Activity/Legal Involvement Pertinent to Current Situation/Hospitalization: No - Comment as needed  Activities of Daily Living Home Assistive Devices/Equipment: Eyeglasses, Dentures (specify type), Bedside commode/3-in-1, Walker (specify type) ADL Screening (condition at time of admission) Patient's cognitive ability adequate to safely complete daily activities?: Yes Is the patient deaf or have difficulty hearing?: No Does the patient have difficulty seeing, even when wearing glasses/contacts?: No Does the patient have difficulty concentrating, remembering, or making decisions?: No Patient able to express need for assistance with ADLs?: Yes Does the patient have difficulty dressing or bathing?: No Independently performs ADLs?: Yes (appropriate for developmental age) Does the patient have difficulty walking or climbing stairs?: No Weakness of Legs: None Weakness of Arms/Hands: None  Permission Sought/Granted Permission sought to share information with : Case Manager Permission granted to share information with : Yes, Verbal Permission Granted     Permission granted to share info w AGENCY: [facility]        Emotional Assessment Appearance:: Appears stated age Attitude/Demeanor/Rapport: Engaged Affect (typically observed): Accepting, Calm Orientation: : Oriented to Self, Oriented to Place, Oriented to  Time, Oriented to Situation Alcohol / Substance Use: Not Applicable Psych Involvement: No (comment)  Admission diagnosis:  Protein calorie malnutrition ([place]) [E46] Patient Active Problem List   Diagnosis Date Noted  . Protein calorie malnutrition ([place]) 08/13/2019  . 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 Leaking at G tube site 05/12/2019  . Failure to thrive in adult   .  Nausea & vomiting   . Hypothermia 04/29/2019  . 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 Symptomatic Anemia 04/25/2017  .  Symptomatic anemia 04/25/2017  . Loss of weight   . Dysphagia   . Hx of laparoscopic gastrostomy tube insertion 05/04/2019 09/06/2016  . At risk for adverse drug event 08/25/2016  . Malnutrition following gastric bypass surgery 08/16/2016  . Anastomotic ulcer 07/21/2016  . Transient alteration of awareness   . Alcohol use 04/27/2016  . Chronic pain syndrome 04/27/2016  . Iron deficiency anemia 04/27/2016  . B12 deficiency 04/27/2016  . Biliary anastomotic occlusion   . Hypoglycemia 04/22/2016  . Syncope 04/22/2016  . Protein-calorie malnutrition, severe 04/22/2016  . Intractable nausea and vomiting   . Abdominal pain, chronic, epigastric   . Hypotension 02/01/2016  . Hyponatremia 02/01/2016  . Subacromial impingement of left shoulder 07/06/2013  . Trochanteric bursitis of left hip 05/11/2013  . Arthritis 05/16/2012  . 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/16/2012   PCP:  Harvie Junior, MD Pharmacy:   [facility] [place], [place] [address] Phone: [phone] Fax: [phone]     Social Determinants of Health (SDOH) Interventions    Readmission Risk Interventions Readmission Risk Prevention Plan 08/21/2019  Transportation Screening Complete  PCP or Specialist Appt within 3-5 Days Complete  HRI or Home Care Consult Complete  Palliative Care Screening Not Applicable  Medication Review (RN Care Manager) Complete  Some recent data might be hidden

## 2019-08-21 NOTE — Progress Notes (Signed)
Dr. Kieth Brightly notified of yellow mews. No new orders at this time

## 2019-08-21 NOTE — Progress Notes (Signed)
Progress Note: General Surgery Service   Chief Complaint/Subjective: Walking with PT well, pain moderate today, some low BPs yesterday and overnight appear to be medication associated  Objective: Vital signs in last 24 hours: Temp:  [98.1 F (36.7 C)-98.7 F (37.1 C)] 98.2 F (36.8 C) (04/27 0923) Pulse Rate:  [87-101] 101 (04/27 0923) Resp:  [14-17] 14 (04/27 0923) BP: (86-129)/(56-86) 93/60 (04/27 0923) SpO2:  [100 %] 100 % (04/27 0923) Last BM Date: 08/19/19  Intake/Output from previous day: 04/26 0701 - 04/27 0700 In: 1830 [P.O.:1830] Out: 3340 [Urine:3300; Drains:40] Intake/Output this shift: Total I/O In: 505 [P.O.:505] Out: 400 [Urine:400]  Gen: NAD  Resp: nonlabored  Card: tachycardia to 101  Abd: incisions c/d/i, small amount of drainage from larger left side incision  Lab Results: CBC  Recent Labs    08/19/19 0519  WBC 8.9  HGB 10.9*  HCT 35.0*  PLT 216   BMET Recent Labs    08/19/19 0519 08/20/19 0434  NA 136 136  K 4.3 4.2  CL 110 114*  CO2 20* 16*  GLUCOSE 105* 96  BUN 7 7  CREATININE 0.89 0.76  CALCIUM 8.6* 8.3*   PT/INR No results for input(s): LABPROT, INR in the last 72 hours. ABG No results for input(s): PHART, HCO3 in the last 72 hours.  Invalid input(s): PCO2, PO2  Anti-infectives: Anti-infectives (From admission, onward)   Start     Dose/Rate Route Frequency Ordered Stop   08/13/19 0600  cefoTEtan (CEFOTAN) 2 g in sodium chloride 0.9 % 100 mL IVPB     2 g 200 mL/hr over 30 Minutes Intravenous On call to O.R. 08/13/19 0537 08/13/19 [id number]      Medications: Scheduled Meds: . acetaminophen  1,000 mg Oral Q8H   Or  . acetaminophen (TYLENOL) oral liquid 160 mg/5 mL  1,000 mg Oral Q8H  . calcium-vitamin D  1 tablet Oral BID  . feeding supplement  1 Container Oral TID BM  . heparin injection (subcutaneous)  5,000 Units Subcutaneous Q8H  . multivitamin with minerals  1 tablet Oral Daily  . pantoprazole  40 mg Oral Daily  .  potassium chloride  20 mEq Oral BID  . QUEtiapine  100 mg Oral QHS   Continuous Infusions: PRN Meds:.ALPRAZolam, cyclobenzaprine, HYDROmorphone (DILAUDID) injection, metoprolol tartrate, ondansetron (ZOFRAN) IV, oxyCODONE, simethicone  Assessment/Plan: s/p Procedure(s): LAPAROSCOPIC REVISION OF ROUX EN Y WITH UPPER ENDOSCOPY; REMOVAL GASTROSTOMY TUBE, TAKEDOWN OF GASTROJEJUNOSTOMY; RESECTION OF SMALL INTESTINE ERAS PATHWAY 08/13/2019 -OT consult today -continue to encourage oral medications -ambulate with assist -home tomorrow   LOS: 8 days   [name], MD [facility]

## 2019-08-22 MED ORDER — OXYCODONE HCL 15 MG PO TABS: 15.0000 mg | ORAL_TABLET | Freq: Four times a day (QID) | ORAL | 0 refills | Status: DC

## 2019-08-22 MED ORDER — ACETAMINOPHEN 500 MG PO TABS: 1000.0000 mg | ORAL_TABLET | Freq: Three times a day (TID) | ORAL | 0 refills | Status: AC

## 2019-08-22 MED ORDER — PANTOPRAZOLE SODIUM 40 MG PO TBEC: 40.0000 mg | DELAYED_RELEASE_TABLET | Freq: Every day | ORAL | 3 refills | Status: DC

## 2019-08-22 MED ORDER — ONDANSETRON 4 MG PO TBDP
4.0000 mg | ORAL_TABLET | Freq: Four times a day (QID) | ORAL | 0 refills | Status: DC | PRN
Start: 2019-08-22 — End: 2022-10-01

## 2019-08-22 MED ORDER — GABAPENTIN 100 MG PO CAPS: 200.0000 mg | ORAL_CAPSULE | Freq: Two times a day (BID) | ORAL | 0 refills | Status: DC

## 2019-08-22 NOTE — Progress Notes (Signed)
D/C instructions given to patient. Patient had no questions. NT or writer will wheel patient out once her rides gets here. Per patient she doesn't have anyone available to pick her up until 2:30pm

## 2019-08-29 ENCOUNTER — Other Ambulatory Visit: Payer: Self-pay

## 2019-08-29 ENCOUNTER — Encounter: Payer: Medicare Other | Attending: Internal Medicine | Admitting: Dietician

## 2019-08-29 DIAGNOSIS — Z6825 Body mass index (BMI) 25.0-25.9, adult: Secondary | ICD-10-CM | POA: Diagnosis not present

## 2019-08-29 DIAGNOSIS — Z713 Dietary counseling and surveillance: Secondary | ICD-10-CM | POA: Diagnosis present

## 2019-08-29 DIAGNOSIS — K9420 Gastrostomy complication, unspecified: Secondary | ICD-10-CM | POA: Insufficient documentation

## 2019-08-29 DIAGNOSIS — Z9884 Bariatric surgery status: Secondary | ICD-10-CM | POA: Diagnosis not present

## 2019-08-29 DIAGNOSIS — E46 Unspecified protein-calorie malnutrition: Secondary | ICD-10-CM | POA: Diagnosis not present

## 2019-08-29 NOTE — Patient Instructions (Signed)
   Remember to avoid drinking fluids with meals. Good job with this!  Include a source of protein with all meals and snacks.   Try to stick with the "recommended foods" and avoid the "not recommended foods" as much as possible. Always avoid foods that you know cause issues for you.   Make sure foods are soft, well-cooked, and chewed very thoroughly.   Continue to drink a lot of fluids every day, good job with drinking lots of water! The goal is 64 ounces or more every day.

## 2019-08-29 NOTE — Progress Notes (Signed)
Medical Nutrition Therapy   Primary concerns today: recent gastric surgery  Referral diagnosis: K94.20- gastrostomy complication, unspecified  Preferred learning style: no preference indicated Learning readiness: change in progress   NUTRITION ASSESSMENT   Anthropometrics   Body Composition Scale 08/29/2019  Weight  lbs 132.1  BMI 25.5  Total Body Fat  % 29.8     Visceral Fat 7  Fat-Free Mass  % 70.1     Total Body Water  % 49.5     Muscle-Mass  lbs 26.7  Body Fat Displacement ---         Torso  lbs 24.2         Left Leg  lbs 4.8         Right Leg  lbs 4.8         Left Arm  lbs 2.4         Right Arm  lbs 2.4   Clinical Medical Hx: RYGB (2007), g-tube placement, RYGB reversal & g-tube removal (April 2021), malnutrition, dysphagia, N/V, hypoglycemia, arthritis, hyponatremia, alcohol use, B12 deficiency, anastomotic ulcer, hypotension Notable Signs/Symptoms: no teeth, occasional use of dentures; brittle nails   Lifestyle & Dietary Hx Patient recently underwent gastric bypass reversal and had her g-tube removed. Patient states she has recovered very well after surgery and is tolerating foods fine. States her weight at time of surgery was around 110 lbs, today she is up to 132 lbs. Very satisfied with her weight gain. 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 Was vomiting foods and liquids up for ~1 year or so due to the restriction, causing acid to erode teeth. Does not have teeth so what she eats partially depends on if dentures are in or not. Insomnia disrupts sleeping and eating schedule. No typical meal pattern, but states she usually does not eat breakfast and is a snacker (2 meals plus 2-3 snacks per day.) Will eat soft meats easy to chew. Tolerating foods well, other than steak and salads which don't sit well. Likes to snack on hard candy, snack cakes, swiss rolls. May have fried chicken with mashed potatoes and vegetable. Avoids  corn due to diverticulitis and avoids raw vegetables due to poor dentition.   Estimated daily fluid intake: 48+ oz water  Supplements: liquid multivitamin, magnesium, calcium, liquid prosource  Sleep: insomnia   24-Hr Dietary Recall First Meal: eggs + bread  Snack: snack cake Second Meal: BBQ chicken + rice + baked beans Snack: candy Third Meal: pork chops + rice + broccoli  Snack: swiss roll Beverages: water, occasionally soda  Estimated Energy Needs Calories: 1600 Carbohydrate: 180g Protein: 100g Fat: 53g   NUTRITION DIAGNOSIS  Increased nutrient needs (NI-5.1) related to recent gastric surgery as evidenced by recent hx of malnutrition and underweight.    NUTRITION INTERVENTION  Nutrition education (E-1) on the following topics:  . Gastric Surgery Nutrition Therapy   Handouts Provided Include   Gastric Surgery Nutrition Therapy   Learning Style & Readiness for Change Teaching method utilized: Visual & Auditory  Demonstrated degree of understanding via: Teach Back  Barriers to learning/adherence to lifestyle change: None Identified   Goals Established by Pt . Avoid drinking fluids with meals.  . Include a source of protein with all meals/snacks. . Stick with "recommended" foods and avoid "not recommended" foods as much as possible (see lists.)  . Ensure foods are soft, well-cooked, and chewed very thoroughly.  . Aim for 64+ ounces of fluid daily.  MONITORING & EVALUATION Dietary intake, weekly physical activity, and goals PRN.  Next Steps  Patient is to contact [facility] for follow up as needed. Patient is recovering very well and is tolerating foods/fluids just fine. No issues of nausea, diarrhea, or constipation reported. Already achieving balanced intake and is taking liquid multivitamins and supplements.

## 2019-09-07 NOTE — Discharge Summary (Signed)
Physician Discharge Summary  Angie [name] OIN:[id number] DOB: July 06, 1965 DOA: 08/13/2019  PCP: Harvie Junior, MD  Admit date: 08/13/2019 Discharge date: 08/22/2019  Recommendations for Outpatient Follow-up:  1.  (include homehealth, outpatient follow-up instructions, specific recommendations for PCP to follow-up on, etc.)  Follow-up Information    Mackenize Delgadillo, Arta Bruce, MD. Go on 08/24/2019.   Specialty: General Surgery Why: at 1120 am,  Please arrive 15 minutes prior to appointment time.  Thank you. Contact information: [place] [address] [phone]        [facility], [place]. Go on 09/19/2019.   Specialty: General Surgery Why: at [address] information: [place] [address] [phone]          Discharge Diagnoses:  Active Problems:   Protein calorie malnutrition [facility])   Surgical Procedure: laparoscopic Roux-en-Y gastric bypass reversal  Discharge Condition: Good Disposition: Home  Diet recommendation: soft diet   Hospital Course:  Patient underwent reversal of gastric bypass. She was admitted to the hospital postoperatively. SHe was slowly advanced a diet and tolerated that well. She had significant pain issues given chronic narcotic use. Pain finally improved to managed with PO medications and she was discharged home with home health.  Discharge Instructions  Discharge Instructions    Ambulate hourly while awake   Complete by: As directed    Call MD for:  difficulty breathing, headache or visual disturbances   Complete by: As directed    Call MD for:  persistant dizziness or light-headedness   Complete by: As directed    Call MD for:  persistant nausea and vomiting   Complete by: As directed    Call MD for:  redness, tenderness, or signs of infection (pain, swelling, redness, odor or green/yellow discharge around incision site)   Complete by: As directed    Call MD for:  severe  uncontrolled pain   Complete by: As directed    Call MD for:  temperature >101 F   Complete by: As directed    Diet Carb Modified   Complete by: As directed    Discharge wound care:   Complete by: As directed    Remove Bandaids tomorrow, ok to shower tomorrow. Steristrips may fall off in 1-3 weeks.   Incentive spirometry   Complete by: As directed    Perform hourly while awake     Allergies as of 08/22/2019      Reactions   Iron Anaphylaxis   Had acute allergy with tachycardia, attention and generalized itching shortly after receiving nulecit ( iron gluconate) infusion.   Lactose Intolerance (gi)       Medication List    STOP taking these medications   Carafate 1 GM/10ML suspension Generic drug: sucralfate     TAKE these medications   ALPRAZolam 1 MG tablet Commonly known as: XANAX Take 1 tablet (1 mg total) by mouth at bedtime as needed for anxiety. What changed: when to take this   blood glucose meter kit and supplies Dispense based on patient and insurance preference. Use up to four times daily as directed. (FOR ICD-10 E10.9, E11.9).   cyclobenzaprine 10 MG tablet Commonly known as: FLEXERIL Take 1 tablet (10 mg total) by mouth 3 (three) times daily as needed for muscle spasms.   furosemide 20 MG tablet Commonly known as: LASIX Take 20 mg by mouth daily.   gabapentin 100 MG capsule Commonly known as: NEURONTIN Take 2 capsules (200 mg total) by  mouth every 12 (twelve) hours.   lidocaine 5 % ointment Commonly known as: XYLOCAINE Apply 1 application topically 3 (three) times daily as needed (pain.). Apply to Bilateral shoulders   liver oil-zinc oxide 40 % ointment Commonly known as: DESITIN Apply topically 2 (two) times daily. Apply around feeding tube   magnesium gluconate 500 MG tablet Commonly known as: MAGONATE Take 1 tablet (500 mg total) by mouth daily.   multivitamin with iron-minerals liquid Take 5 mLs by mouth daily. Centrum Liquid Multivitamin    OLANZapine 2.5 MG tablet Commonly known as: ZYPREXA Take 2.5 mg by mouth daily in the afternoon.   ondansetron 4 MG disintegrating tablet Commonly known as: ZOFRAN-ODT Take 1 tablet (4 mg total) by mouth every 6 (six) hours as needed for nausea or vomiting.   oxyCODONE 15 MG immediate release tablet Commonly known as: ROXICODONE Take 1 tablet (15 mg total) by mouth in the morning, at noon, in the evening, and at bedtime.   oxymetazoline 0.05 % nasal spray Commonly known as: AFRIN Place 2 sprays into both nostrils 2 (two) times daily as needed for congestion.   pantoprazole 40 MG tablet Commonly known as: PROTONIX Take 1 tablet (40 mg total) by mouth at bedtime.   protein supplement Liqd Take 30 mLs by mouth in the morning and at bedtime.   QUEtiapine 50 MG tablet Commonly known as: SEROQUEL Take 50 mg by mouth at bedtime.   VITAMIN B-12 PO Place 2 mLs under the tongue daily.     ASK your doctor about these medications   acetaminophen 500 MG tablet Commonly known as: TYLENOL Take 2 tablets (1,000 mg total) by mouth every 8 (eight) hours for 5 days. Ask about: Should I take this medication?            Discharge Care Instructions  (From admission, onward)         Start     Ordered   08/22/19 0000  Discharge wound care:    Comments: Remove Bandaids tomorrow, ok to shower tomorrow. Steristrips may fall off in 1-3 weeks.   08/22/19 1016         Follow-up Information    [place] Masaki, Arta Bruce, MD. Go on 08/24/2019.   Specialty: General Surgery Why: at 1120 am,  Please arrive 15 minutes prior to appointment time.  Thank you. Contact information: [place] [address] [phone]        [facility], [place]. Go on 09/19/2019.   Specialty: General Surgery Why: at [address] Contact information: [place] [address] [phone]            The results of significant diagnostics from this  hospitalization (including imaging, microbiology, ancillary and laboratory) are listed below for reference.    Significant Diagnostic Studies: CT HEAD WO CONTRAST  Result Date: 08/15/2019 CLINICAL DATA:  Encephalopathy. EXAM: CT HEAD WITHOUT CONTRAST TECHNIQUE: Contiguous axial images were obtained from the base of the skull through the vertex without intravenous contrast. COMPARISON:  04/29/2019 FINDINGS: Brain: There is no evidence of acute infarct, intracranial hemorrhage, mass, midline shift, or extra-axial fluid collection. The ventricles and sulci are normal. A partially empty sella is unchanged. Vascular: Calcified atherosclerosis at the skull base. Skull: No hyperdense vessel. No fracture or suspicious osseous lesion. Sinuses/Orbits: Partially visualized mild mucosal thickening or small mucous retention cyst in the right maxillary sinus. Clear mastoid air cells. Unremarkable orbits. Other: None. IMPRESSION: No evidence of acute intracranial abnormality.  Electronically Signed   By: Logan Bores M.D.   On: 08/15/2019 17:32   DG UGI W SINGLE CM (SOL OR THIN BA)  Result Date: 08/14/2019 CLINICAL DATA:  54 year old female status post reversal of gastrojejunostomy and removal of G-tube. EXAM: WATER SOLUBLE UPPER GI SERIES TECHNIQUE: Single-column upper GI series was performed using water soluble contrast. CONTRAST:  37m OMNIPAQUE IOHEXOL 300 MG/ML  SOLN COMPARISON:  No priors. FLUOROSCOPY TIME:  Fluoroscopy Time:  3 minutes and 6 seconds Radiation Exposure Index (if provided by the fluoroscopic device): 22.1 mGy FINDINGS: Examination was limited by lack of patient mobility. With these limitations in mind, the esophagus was grossly normal in appearance, although some degree of esophageal dysmotility was noted. Images of the stomach demonstrated distortion of normal gastric anatomy, presumably reflective of recent reversal of gastrojejunostomy and gastrogastric anastomosis. No unexpected to extravasation  of contrast material was noted. Contrast readily traversed the pylorus. Proximal duodenum was grossly normal in appearance. IMPRESSION: 1. Expected postoperative findings following reversal of gastrojejunostomy and removal of G-tube with patent gastrogastric anastomosis and no evidence of postoperative leak. Electronically Signed   By: DVinnie LangtonM.D.   On: 08/14/2019 09:19    Labs: Basic Metabolic Panel: No results for input(s): NA, K, CL, CO2, GLUCOSE, BUN, CREATININE, CALCIUM, MG, PHOS in the last 168 hours. Liver Function Tests: No results for input(s): AST, ALT, ALKPHOS, BILITOT, PROT, ALBUMIN in the last 168 hours.  CBC: No results for input(s): WBC, NEUTROABS, HGB, HCT, MCV, PLT in the last 168 hours.  CBG: No results for input(s): GLUCAP in the last 168 hours.  Active Problems:   Protein calorie malnutrition ([place]   Time coordinating discharge: 15 min

## 2020-03-26 ENCOUNTER — Other Ambulatory Visit: Payer: Self-pay

## 2020-03-26 ENCOUNTER — Ambulatory Visit: Payer: Medicare Other | Admitting: Orthopaedic Surgery

## 2020-03-26 ENCOUNTER — Ambulatory Visit (INDEPENDENT_AMBULATORY_CARE_PROVIDER_SITE_OTHER): Payer: Medicare Other | Admitting: Orthopaedic Surgery

## 2020-03-26 ENCOUNTER — Ambulatory Visit: Payer: Self-pay

## 2020-03-26 ENCOUNTER — Encounter: Payer: Self-pay | Admitting: Orthopaedic Surgery

## 2020-03-26 VITALS — Ht 60.0 in | Wt 150.0 lb

## 2020-03-26 DIAGNOSIS — M25512 Pain in left shoulder: Secondary | ICD-10-CM | POA: Diagnosis not present

## 2020-03-26 DIAGNOSIS — G8929 Other chronic pain: Secondary | ICD-10-CM | POA: Diagnosis not present

## 2020-03-26 MED ORDER — METHYLPREDNISOLONE ACETATE 40 MG/ML IJ SUSP
40.0000 mg | INTRAMUSCULAR | Status: AC | PRN
  Administered 2020-03-26: 40 mg via INTRA_ARTICULAR

## 2020-03-26 MED ORDER — LIDOCAINE HCL 1 % IJ SOLN
0.5000 mL | INTRAMUSCULAR | Status: AC | PRN
  Administered 2020-03-26: .5 mL

## 2020-03-26 MED ORDER — BUPIVACAINE HCL 0.25 % IJ SOLN
4.0000 mL | INTRAMUSCULAR | Status: AC | PRN
  Administered 2020-03-26: 4 mL via INTRA_ARTICULAR

## 2020-03-26 NOTE — Progress Notes (Addendum)
Office Visit Note   Patient: Angie [name]           Date of Birth: 10-13-1965           MRN: [id number] Visit Date: 03/26/2020              Requested by: Harvie Junior, MD [address] [place],  [place] [address] PCP: Harvie Junior, MD   Assessment & Plan: Visit Diagnoses:  1. Chronic left shoulder pain     Plan: Subacromial injection performed left shoulder.  She related that she felt that she had less pain and work better when ultrasound of been used when she lived in the [place].  She can follow-up on an as-needed basis if she is not better after the injection after 6 weeks we can consider a repeat MRI scan.  Long discussion about the amount of narcotic medication she is taking and problems and leg pain with that amount of medication.  Follow-Up Instructions: Return if symptoms worsen or fail to improve.   Orders:  Orders Placed This Encounter  Procedures  . XR Shoulder Left   No orders of the defined types were placed in this encounter.     Procedures: Large Joint Inj on 03/26/2020 11:37 AM Indications: pain Details: 22 G 1.5 in needle  Arthrogram: No  Medications: 4 mL bupivacaine 0.25 %; 40 mg methylPREDNISolone acetate 40 MG/ML; 0.5 mL lidocaine 1 % Outcome: tolerated well, no immediate complications Procedure, treatment alternatives, risks and benefits explained, specific risks discussed. Consent was given by the patient. Immediately prior to procedure a time out was called to verify the correct patient, procedure, equipment, support staff and site/side marked as required. Patient was prepped and draped in the usual sterile fashion.       Clinical Data: No additional findings.   Subjective: Chief Complaint  Patient presents with  . Left Shoulder - Pain    HPI 54 year old female here for first-time visit with chronic shoulder pain.  She states she is to get shots in her shoulder or other areas twice a month with cortisone.  She has been  on chronic narcotic pain medication and is currently on oxycodone 15 mg 4 times a day and takes Xanax 1 mg 3 times a day.  Previous bariatric surgery.  Patient has limited active range of motion of her shoulder does better with elbow extended and can flex to about 110 degrees with pain.  She not had injection in her shoulder in over a year.  She states she tore some ligaments in her shoulder in the distant past and had a scan at some point and this was when she lived in the [place].  History of rheumatoid arthritis.not on biologic.   Review of Systems all other systems noncontributory to HPI.   Objective: Vital Signs: Ht 5' (1.524 m)   Wt 150 lb (68 kg)   BMI 29.29 kg/m   Physical Exam Constitutional:      Appearance: She is well-developed.  HENT:     Head: Normocephalic.     Right Ear: External ear normal.     Left Ear: External ear normal.  Eyes:     Pupils: Pupils are equal, round, and reactive to light.  Neck:     Thyroid: No thyromegaly.     Trachea: No tracheal deviation.  Cardiovascular:     Rate and Rhythm: Normal rate.  Pulmonary:     Effort: Pulmonary effort is normal.  Abdominal:  Palpations: Abdomen is soft.  Skin:    General: Skin is warm and dry.  Neurological:     Mental Status: She is alert and oriented to person, place, and time.  Psychiatric:        Behavior: Behavior normal.     Ortho Exam negative drop arm test positive Hawkins moderately positive Neer.  No brachial plexus tenderness.  Negative Spurling.  No atrophy of the upper extremity no distal migration of biceps muscle on the left.  Specialty Comments:  No specialty comments available.  Imaging: No results found.   PMFS History: Patient Active Problem List   Diagnosis Date Noted  . Protein calorie malnutrition ([place]) 08/13/2019  . 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 Leaking at G tube site 05/12/2019  . Failure to thrive in adult   . Nausea & vomiting   . Hypothermia  04/29/2019  . 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 Symptomatic Anemia 04/25/2017  . Symptomatic anemia 04/25/2017  . Loss of weight   . Dysphagia   . Hx of laparoscopic gastrostomy tube insertion 05/04/2019 09/06/2016  . At risk for adverse drug event 08/25/2016  . Malnutrition following gastric bypass surgery 08/16/2016  . Anastomotic ulcer 07/21/2016  . Transient alteration of awareness   . Alcohol use 04/27/2016  . Chronic pain syndrome 04/27/2016  . Iron deficiency anemia 04/27/2016  . B12 deficiency 04/27/2016  . Biliary anastomotic occlusion   . Hypoglycemia 04/22/2016  . Syncope 04/22/2016  . Protein-calorie malnutrition, severe 04/22/2016  . Intractable nausea and vomiting   . Abdominal pain, chronic, epigastric   . Hypotension 02/01/2016  . Hyponatremia 02/01/2016  . Subacromial impingement of left shoulder 07/06/2013  . Trochanteric bursitis of left hip 05/11/2013  . Arthritis 05/16/2012  . 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/16/2012   Past Medical History:  Diagnosis Date  . Abnormal Pap smear   . Alcohol abuse   . Anemia    IDA  . Anxiety    Takes xanax  . Arthritis    left hip/knees, lower spine  . Bilateral lower extremity edema   . Bipolar disorder ([place])   . Blood transfusion without reported diagnosis last done 04-25-17  . Bulging lumbar disc   . Bursitis    left shoulder  . Chronic lower back pain   . Cut    right middle finger with knife small cut healing pt instructed to keep clean and dry no redness or drainage  . Depression   . Diverticulitis   . Fibromyalgia   . Gastrointestinal tube present ([place])   . GERD (gastroesophageal reflux disease)   . Hypertension    none since weight loss, no medications at this time  . Hypoglycemia   . Lactose intolerance in adult 2017  . Migraines    "weekly @ least" (04/26/2016)  . Seizures ([place])    one Dec. 2017 due to throat closing  . Type II diabetes mellitus  ([place])    "before the gastric bypass" (04/26/2016) states she no longer has diabetes    Family History  Problem Relation Age of Onset  . Diabetes Father   . Heart disease Father   . Depression Maternal Grandmother   . Heart disease Maternal Grandfather   . Depression Paternal Grandmother   . Colon cancer Paternal Grandfather   . Pancreatic cancer Paternal Grandfather     Past Surgical History:  Procedure Laterality Date  . BALLOON DILATION N/A 05/27/2016   Procedure:  BALLOON DILATION;  Surgeon: Doran Stabler, MD;  Location: [facility];  Service: Gastroenterology;  Laterality: N/A;  . BALLOON DILATION N/A 04/21/2017   Procedure: BALLOON DILATION;  Surgeon: Doran Stabler, MD;  Location: [facility];  Service: Gastroenterology;  Laterality: N/A;  . CERVICAL CONE BIOPSY    . [place]; 1996; 1998; 2014  . CESAREAN SECTION WITH BILATERAL TUBAL LIGATION Bilateral 10/28/2012   Procedure: Repeat cesarean section with delivery of baby boy at 5. Apgars 8/9.  BILATERAL TUBAL LIGATION;  Surgeon: Florian Buff, MD;  Location: [facility];  Service: Obstetrics;  Laterality: Bilateral;  . DILATION AND CURETTAGE OF UTERUS    . ESOPHAGOGASTRODUODENOSCOPY N/A 05/27/2016   Procedure: ESOPHAGOGASTRODUODENOSCOPY (EGD);  Surgeon: Doran Stabler, MD;  Location: [facility];  Service: Gastroenterology;  Laterality: N/A;  . ESOPHAGOGASTRODUODENOSCOPY (EGD) WITH PROPOFOL N/A 04/23/2016   Procedure: ESOPHAGOGASTRODUODENOSCOPY (EGD) WITH PROPOFOL;  Surgeon: Doran Stabler, MD;  Location: [facility];  Service: Endoscopy;  Laterality: N/A;  . ESOPHAGOGASTRODUODENOSCOPY (EGD) WITH PROPOFOL N/A 01/03/2017   Procedure: ESOPHAGOGASTRODUODENOSCOPY (EGD) WITH PROPOFOL;  Surgeon: Doran Stabler, MD;  Location: [facility];  Service: Gastroenterology;  Laterality: N/A;  . ESOPHAGOGASTRODUODENOSCOPY (EGD) WITH PROPOFOL N/A 04/21/2017   Procedure: ESOPHAGOGASTRODUODENOSCOPY (EGD) WITH PROPOFOL;   Surgeon: Doran Stabler, MD;  Location: [facility];  Service: Gastroenterology;  Laterality: N/A;  . ESOPHAGOGASTRODUODENOSCOPY (EGD) WITH PROPOFOL N/A 05/02/2019   Procedure: ESOPHAGOGASTRODUODENOSCOPY (EGD) WITH PROPOFOL;  Surgeon: Irene Shipper, MD;  Location: [facility];  Service: Endoscopy;  Laterality: N/A;  . GASTROSTOMY N/A 08/16/2016   Procedure: LAPAROSCOPIC INSERTION OF GASTROSTOMY TUBE IN REMNANT STOMACH;  Surgeon: Arta Bruce Kinsinger, MD;  Location: [facility];  Service: General;  Laterality: N/A;  . GASTROSTOMY N/A 05/16/2017   Procedure: Replacement of  GASTROSTOMY TUBE;  Surgeon: Kieth Brightly Arta Bruce, MD;  Location: [facility];  Service: General;  Laterality: N/A;  . IR CM INJ ANY COLONIC TUBE W/FLUORO  05/15/2019  . LAPAROSCOPIC INSERTION GASTROSTOMY TUBE Left 05/04/2019   Procedure: LAPAROSCOPIC INSERTION GASTROSTOMY TUBE;  Surgeon: Kieth Brightly Arta Bruce, MD;  Location: [facility];  Service: General;  Laterality: Left;  . LAPAROSCOPIC REVISION OF GASTROJEJUNOSTOMY Left 05/16/2017   Procedure: LAPAROSCOPIC REVISION OF GASTROJEJUNOSTOMY;  Surgeon: Kinsinger, Arta Bruce, MD;  Location: [facility];  Service: General;  Laterality: Left;  . LAPAROSCOPIC REVISION OF ROUXENY WITH UPPER ENDOSCOPY N/A 08/13/2019   Procedure: LAPAROSCOPIC REVISION OF ROUX EN Y WITH UPPER ENDOSCOPY; REMOVAL GASTROSTOMY TUBE, TAKEDOWN OF GASTROJEJUNOSTOMY; RESECTION OF SMALL INTESTINE ERAS PATHWAY;  Surgeon: Kinsinger, Arta Bruce, MD;  Location: [facility];  Service: General;  Laterality: N/A;  . ROUX-EN-Y GASTRIC BYPASS  2007  . TUBAL LIGATION  2014   Social History   Occupational History  . Not on file  Tobacco Use  . Smoking status: Former Smoker    Packs/day: 0.50    Years: 4.00    Pack years: 2.00    Types: Cigarettes    Quit date: 03/16/2012    Years since quitting: 8.0  . Smokeless tobacco: Never Used  Vaping Use  . Vaping Use: Never used  Substance and Sexual Activity  . Alcohol use: Yes    Alcohol/week:  17.0 standard drinks    Types: 6 Glasses of wine, 11 Shots of liquor per week    Comment: occ  . Drug use: No  . Sexual activity: Yes    Birth control/protection: None, Surgical

## 2020-05-07 ENCOUNTER — Other Ambulatory Visit: Payer: Self-pay | Admitting: Oral Surgery

## 2020-05-16 ENCOUNTER — Other Ambulatory Visit: Payer: Self-pay

## 2020-05-16 ENCOUNTER — Encounter: Payer: Self-pay | Admitting: Orthopaedic Surgery

## 2020-05-16 ENCOUNTER — Ambulatory Visit (INDEPENDENT_AMBULATORY_CARE_PROVIDER_SITE_OTHER): Payer: Medicare Other | Admitting: Orthopaedic Surgery

## 2020-05-16 VITALS — BP 160/108 | HR 80 | Ht 60.0 in | Wt 140.0 lb

## 2020-05-16 DIAGNOSIS — M25512 Pain in left shoulder: Secondary | ICD-10-CM | POA: Diagnosis not present

## 2020-05-16 DIAGNOSIS — M7542 Impingement syndrome of left shoulder: Secondary | ICD-10-CM

## 2020-05-16 DIAGNOSIS — G8929 Other chronic pain: Secondary | ICD-10-CM

## 2020-05-16 NOTE — Progress Notes (Signed)
Office Visit Note   Patient: Angie [name]           Date of Birth: Nov 21, 1965           MRN: [id number] Visit Date: 05/16/2020              Requested by: Harvie Junior, MD [address] [place],  [place] [address] PCP: Harvie Junior, MD   Assessment & Plan: Visit Diagnoses:  1. Chronic left shoulder pain   2. Subacromial impingement of left shoulder     Plan: I discussed with the patient and recommend proceeding with MRI scan she said previous injections may have a rotator cuff tear.  Difficult to determine the amount of pain this is causing with her pain management narcotic dosage.  She can return after MRI left shoulder rule out rotator cuff tear.  Follow-Up Instructions: No follow-ups on file.   Orders:  Orders Placed This Encounter  Procedures  . MR Shoulder Left w/o contrast   No orders of the defined types were placed in this encounter.     Procedures: No procedures performed   Clinical Data: No additional findings.   Subjective: Chief Complaint  Patient presents with  . Left Shoulder - Pain    HPI 55 year old female returns post left shoulder subacromial injection 6 weeks ago done on 03/26/2020.  Patient states it helped until this past Sunday when she started having increased pain difficulty moving her arm problems getting dressed and problems sleeping.  She had previous injections done when she lived in the [place].  No numbness or tingling in her fingers.  She has noticed decreased range of motion difficulty getting her arm above her head.  She had some oxycodone given for mouth surgery a week ago and states that this also did not help with the shoulder pain.  She use some lidocaine cream without relief she is taken anti-inflammatories as well.  Review of Systems previous bariatric surgery.  History of chronic narcotic medication pain management 15 mg oxycodones 120 a month.  All other systems  noncontributory.   Objective: Vital Signs: BP (!) 160/108   Pulse 80   Ht 5' (1.524 m)   Wt 140 lb (63.5 kg)   BMI 27.34 kg/m   Physical Exam Constitutional:      Appearance: Normal appearance. She is well-developed and normal weight.  HENT:     Head: Normocephalic.     Right Ear: External ear normal.     Left Ear: External ear normal.  Eyes:     Pupils: Pupils are equal, round, and reactive to light.  Neck:     Thyroid: No thyromegaly.     Trachea: No tracheal deviation.  Cardiovascular:     Rate and Rhythm: Normal rate.  Pulmonary:     Effort: Pulmonary effort is normal.  Abdominal:     Palpations: Abdomen is soft.  Skin:    General: Skin is warm and dry.  Neurological:     Mental Status: She is alert and oriented to person, place, and time.  Psychiatric:        Mood and Affect: Mood and affect normal.        Behavior: Behavior normal.     Ortho Exam positive impingement left shoulder.  Some brachial plexus tenderness mild on the left negative Spurling.  Upper semireflexes are 2+.  Long of the biceps palpation she withdraws.  Some tenderness on the opposite right shoulder.  Negative drop arm  test right shoulder.  Positive Neer test left.  Ulnar nerve at the elbow is normal median ulnar nerve the wrist is normal.  Normal heel toe gait.  Specialty Comments:  No specialty comments available.  Imaging: No results found.   PMFS History: Patient Active Problem List   Diagnosis Date Noted  . Protein calorie malnutrition ([place]) 08/13/2019  . 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 Leaking at G tube site 05/12/2019  . Failure to thrive in adult   . Nausea & vomiting   . Hypothermia 04/29/2019  . 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 Symptomatic Anemia 04/25/2017  . Symptomatic anemia 04/25/2017  . Loss of weight   . Dysphagia   . Hx of laparoscopic gastrostomy tube insertion 05/04/2019 09/06/2016  . At risk for  adverse drug event 08/25/2016  . Malnutrition following gastric bypass surgery 08/16/2016  . Anastomotic ulcer 07/21/2016  . Transient alteration of awareness   . Alcohol use 04/27/2016  . Chronic pain syndrome 04/27/2016  . Iron deficiency anemia 04/27/2016  . B12 deficiency 04/27/2016  . Biliary anastomotic occlusion   . Hypoglycemia 04/22/2016  . Syncope 04/22/2016  . Protein-calorie malnutrition, severe 04/22/2016  . Intractable nausea and vomiting   . Abdominal pain, chronic, epigastric   . Hypotension 02/01/2016  . Hyponatremia 02/01/2016  . Subacromial impingement of left shoulder 07/06/2013  . Trochanteric bursitis of left hip 05/11/2013  . Arthritis 05/16/2012  . 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/16/2012   Past Medical History:  Diagnosis Date  . Abnormal Pap smear   . Alcohol abuse   . Anemia    IDA  . Anxiety    Takes xanax  . Arthritis    left hip/knees, lower spine  . Bilateral lower extremity edema   . Bipolar disorder ([place])   . Blood transfusion without reported diagnosis last done 04-25-17  . Bulging lumbar disc   . Bursitis    left shoulder  . Chronic lower back pain   . Cut    right middle finger with knife small cut healing pt instructed to keep clean and dry no redness or drainage  . Depression   . Diverticulitis   . Fibromyalgia   . Gastrointestinal tube present ([place])   . GERD (gastroesophageal reflux disease)   . Hypertension    none since weight loss, no medications at this time  . Hypoglycemia   . Lactose intolerance in adult 2017  . Migraines    "weekly @ least" (04/26/2016)  . Seizures ([place])    one Dec. 2017 due to throat closing  . Type II diabetes mellitus ([place])    "before the gastric bypass" (04/26/2016) states she no longer has diabetes    Family History  Problem Relation Age of Onset  . Diabetes Father   . Heart disease Father   . Depression Maternal Grandmother   . Heart disease Maternal Grandfather   . Depression  Paternal Grandmother   . Colon cancer Paternal Grandfather   . Pancreatic cancer Paternal Grandfather     Past Surgical History:  Procedure Laterality Date  . BALLOON DILATION N/A 05/27/2016   Procedure: BALLOON DILATION;  Surgeon: Doran Stabler, MD;  Location: [facility];  Service: Gastroenterology;  Laterality: N/A;  . BALLOON DILATION N/A 04/21/2017   Procedure: BALLOON DILATION;  Surgeon: Doran Stabler, MD;  Location: [facility];  Service: Gastroenterology;  Laterality: N/A;  . CERVICAL CONE BIOPSY    .  [place]; 1996; 1998; 2014  . CESAREAN SECTION WITH BILATERAL TUBAL LIGATION Bilateral 10/28/2012   Procedure: Repeat cesarean section with delivery of baby boy at 31. Apgars 8/9.  BILATERAL TUBAL LIGATION;  Surgeon: Florian Buff, MD;  Location: [facility];  Service: Obstetrics;  Laterality: Bilateral;  . DILATION AND CURETTAGE OF UTERUS    . ESOPHAGOGASTRODUODENOSCOPY N/A 05/27/2016   Procedure: ESOPHAGOGASTRODUODENOSCOPY (EGD);  Surgeon: Doran Stabler, MD;  Location: [facility];  Service: Gastroenterology;  Laterality: N/A;  . ESOPHAGOGASTRODUODENOSCOPY (EGD) WITH PROPOFOL N/A 04/23/2016   Procedure: ESOPHAGOGASTRODUODENOSCOPY (EGD) WITH PROPOFOL;  Surgeon: Doran Stabler, MD;  Location: [facility];  Service: Endoscopy;  Laterality: N/A;  . ESOPHAGOGASTRODUODENOSCOPY (EGD) WITH PROPOFOL N/A 01/03/2017   Procedure: ESOPHAGOGASTRODUODENOSCOPY (EGD) WITH PROPOFOL;  Surgeon: Doran Stabler, MD;  Location: [facility];  Service: Gastroenterology;  Laterality: N/A;  . ESOPHAGOGASTRODUODENOSCOPY (EGD) WITH PROPOFOL N/A 04/21/2017   Procedure: ESOPHAGOGASTRODUODENOSCOPY (EGD) WITH PROPOFOL;  Surgeon: Doran Stabler, MD;  Location: [facility];  Service: Gastroenterology;  Laterality: N/A;  . ESOPHAGOGASTRODUODENOSCOPY (EGD) WITH PROPOFOL N/A 05/02/2019   Procedure: ESOPHAGOGASTRODUODENOSCOPY (EGD) WITH PROPOFOL;  Surgeon: Irene Shipper, MD;  Location[facility];  Service: Endoscopy;  Laterality: N/A;  . GASTROSTOMY N/A 08/16/2016   Procedure: LAPAROSCOPIC INSERTION OF GASTROSTOMY TUBE IN REMNANT STOMACH;  Surgeon: Arta Bruce Kinsinger, MD;  Location: [facility];  Service: General;  Laterality: N/A;  . GASTROSTOMY N/A 05/16/2017   Procedure: Replacement of  GASTROSTOMY TUBE;  Surgeon: Kieth Brightly Arta Bruce, MD;  Location: [facility];  Service: General;  Laterality: N/A;  . IR CM INJ ANY COLONIC TUBE W/FLUORO  05/15/2019  . LAPAROSCOPIC INSERTION GASTROSTOMY TUBE Left 05/04/2019   Procedure: LAPAROSCOPIC INSERTION GASTROSTOMY TUBE;  Surgeon: Kieth Brightly Arta Bruce, MD;  Location: [facility];  Service: General;  Laterality: Left;  . LAPAROSCOPIC REVISION OF GASTROJEJUNOSTOMY Left 05/16/2017   Procedure: LAPAROSCOPIC REVISION OF GASTROJEJUNOSTOMY;  Surgeon: Kinsinger, Arta Bruce, MD;  [facility];  Service: General;  Laterality: Left;  . LAPAROSCOPIC REVISION OF ROUXENY WITH UPPER ENDOSCOPY N/A 08/13/2019   Procedure: LAPAROSCOPIC REVISION OF ROUX EN Y WITH UPPER ENDOSCOPY; REMOVAL GASTROSTOMY TUBE, TAKEDOWN OF GASTROJEJUNOSTOMY; RESECTION OF SMALL INTESTINE ERAS PATHWAY;  Surgeon: Kinsinger, Arta Bruce, MD;  Location: [facility];  Service: General;  Laterality: N/A;  . ROUX-EN-Y GASTRIC BYPASS  2007  . TUBAL LIGATION  2014   Social History   Occupational History  . Not on file  Tobacco Use  . Smoking status: Former Smoker    Packs/day: 0.50    Years: 4.00    Pack years: 2.00    Types: Cigarettes    Quit date: 03/16/2012    Years since quitting: 8.1  . Smokeless tobacco: Never Used  Vaping Use  . Vaping Use: Never used  Substance and Sexual Activity  . Alcohol use: Yes    Alcohol/week: 17.0 standard drinks    Types: 6 Glasses of wine, 11 Shots of liquor per week    Comment: occ  . Drug use: No  . Sexual activity: Yes    Birth control/protection: None, Surgical

## 2020-05-26 ENCOUNTER — Telehealth: Payer: Self-pay | Admitting: Orthopaedic Surgery

## 2020-05-26 ENCOUNTER — Telehealth: Payer: Self-pay

## 2020-05-26 NOTE — Telephone Encounter (Signed)
Called patient to schedule mri review appointment patient is requesting for Dr.Yates to send a urgent referral so she can follow up due to Dr.Yates not being available week of 2/7 patient stated she cant wait until 2/15 to be seen. CB:[phone]

## 2020-05-26 NOTE — Telephone Encounter (Signed)
MRI is scheduled for 06/01/2020. Patient does not feel that she can wait another week to review.  Would you like for me to bring her in to see someone else for MRI review or advise this is all that is available?

## 2020-05-26 NOTE — Telephone Encounter (Signed)
I called and discussed. Already on pain management. ROV when I get back. thanks

## 2020-05-26 NOTE — Telephone Encounter (Signed)
Please see message from Dr. Lorin Mercy below. He called patient. She can keep appt for 2/15.

## 2020-05-26 NOTE — Telephone Encounter (Signed)
Called [facility] and left message. Waiting for a call back for a MRI Follow up.

## 2020-06-01 ENCOUNTER — Ambulatory Visit
Admission: RE | Admit: 2020-06-01 | Discharge: 2020-06-01 | Disposition: A | Discharge status: Home / Self Care | Payer: Medicare Other | Source: Ambulatory Visit | Attending: Orthopaedic Surgery | Admitting: Orthopaedic Surgery

## 2020-06-01 ENCOUNTER — Other Ambulatory Visit: Payer: Self-pay

## 2020-06-01 DIAGNOSIS — G8929 Other chronic pain: Secondary | ICD-10-CM

## 2020-06-10 ENCOUNTER — Other Ambulatory Visit: Payer: Self-pay

## 2020-06-10 ENCOUNTER — Ambulatory Visit (INDEPENDENT_AMBULATORY_CARE_PROVIDER_SITE_OTHER): Payer: Medicare Other | Admitting: Orthopaedic Surgery

## 2020-06-10 VITALS — BP 156/100 | HR 80 | Ht 60.0 in | Wt 140.0 lb

## 2020-06-10 DIAGNOSIS — M19012 Primary osteoarthritis, left shoulder: Secondary | ICD-10-CM

## 2020-06-10 DIAGNOSIS — M7542 Impingement syndrome of left shoulder: Secondary | ICD-10-CM

## 2020-06-10 DIAGNOSIS — M67819 Other specified disorders of synovium and tendon, unspecified shoulder: Secondary | ICD-10-CM | POA: Insufficient documentation

## 2020-06-10 MED ORDER — LIDOCAINE HCL 1 % IJ SOLN
0.5000 mL | INTRAMUSCULAR | Status: AC | PRN
  Administered 2020-06-10: .5 mL

## 2020-06-10 MED ORDER — METHYLPREDNISOLONE ACETATE 40 MG/ML IJ SUSP
40.0000 mg | INTRAMUSCULAR | Status: AC | PRN
  Administered 2020-06-10: 40 mg via INTRA_ARTICULAR

## 2020-06-10 MED ORDER — BUPIVACAINE HCL 0.25 % IJ SOLN
4.0000 mL | INTRAMUSCULAR | Status: AC | PRN
  Administered 2020-06-10: 4 mL via INTRA_ARTICULAR

## 2020-06-10 NOTE — Progress Notes (Signed)
Office Visit Note   Patient: Angie [name]           Date of Birth: December 04, 1965           MRN: [id number] Visit Date: 06/10/2020              Requested by: Harvie Junior, MD [address] [place],   [id number] PCP: Harvie Junior, MD   Assessment & Plan: Visit Diagnoses:  1. Subacromial impingement of left shoulder   2. Primary osteoarthritis, left shoulder   3. Tendinosis of rotator cuff     Plan: Intra-articular cortisone injection performed.  Her MRI scan shows some partial supraspinatus tearing, some arthritis.  Small ganglion incidental supraspinatus musculotendinous junction.  Moderate tendinosis of the supraspinatus subscap and infraspinatus.  Up with intra-articular injection will help and we will set up for some physical therapy for her shoulder range of motion and gradual strengthening.  Follow-Up Instructions: Return in about 6 weeks (around 07/22/2020).   Orders:  Orders Placed This Encounter  Procedures  . Large Joint Inj: L glenohumeral   No orders of the defined types were placed in this encounter.     Procedures: Large Joint Inj: L glenohumeral on 06/10/2020 3:50 PM Indications: pain Details: 22 G 1.5 in needle  Arthrogram: No  Medications: 4 mL bupivacaine 0.25 %; 40 mg methylPREDNISolone acetate 40 MG/ML; 0.5 mL lidocaine 1 % Outcome: tolerated well, no immediate complications Procedure, treatment alternatives, risks and benefits explained, specific risks discussed. Consent was given by the patient. Immediately prior to procedure a time out was called to verify the correct patient, procedure, equipment, support staff and site/side marked as required. Patient was prepped and draped in the usual sterile fashion.       Clinical Data: No additional findings.   Subjective: Chief Complaint  Patient presents with  . Left Shoulder - Follow-up    HPI 55 year old female with not working due to disability is having problems with ongoing  left shoulder pain.  States she also has some hip pain with minimal hip changes on plain radiographs and has small disc bulge at L4-5 without central or foraminal compression.  Shoulder MRI scan has been obtained of her left shoulder which shows moderate to severe glenohumeral arthritis with cartilage wear particular inferior aspect glenohumeral joint.  She has some partial tearing supraspinatus some tendinopathy of the rotator cuff without full-thickness tear.  Some labral degeneration is noted biceps tendon appeared to be intact. Review of Systems   Objective: Vital Signs: BP (!) 156/100   Pulse 80   Ht 5' (1.524 m)   Wt 140 lb (63.5 kg)   BMI 27.34 kg/m   Physical Exam Constitutional:      Appearance: She is well-developed.  HENT:     Head: Normocephalic.     Right Ear: External ear normal.     Left Ear: External ear normal.  Eyes:     Pupils: Pupils are equal, round, and reactive to light.  Neck:     Thyroid: No thyromegaly.     Trachea: No tracheal deviation.  Cardiovascular:     Rate and Rhythm: Normal rate.  Pulmonary:     Effort: Pulmonary effort is normal.  Abdominal:     Palpations: Abdomen is soft.  Skin:    General: Skin is warm and dry.  Neurological:     Mental Status: She is alert and oriented to person, place, and time.  Psychiatric:  Mood and Affect: Mood and affect normal.        Behavior: Behavior normal.     Ortho Exam limited left shoulder flexion 100 degrees limited in abduction 90 degrees left shoulder.  Long head of the biceps mildly tender.  Pain with resisted supraspinatus testing.  Good cervical rotation negative Spurling right and left.  Specialty Comments:  No specialty comments available.  Imaging: CLINICAL DATA:  Chronic shoulder pain  EXAM: MRI OF THE LEFT SHOULDER WITHOUT CONTRAST  TECHNIQUE: Multiplanar, multisequence MR imaging of the shoulder was performed. No intravenous contrast was administered.  COMPARISON:   None.  FINDINGS: Rotator cuff: There is a low-grade partial articular surface tear seen of the anterior supraspinatus tendon at the insertion site measuring 6 mm in AP dimension. Increased globular signal thickening seen throughout the remainder of the supraspinatus tendon. There is also a 6 mm tiny cyst seen within the myotendinous junction of the supraspinatus tendon, likely small ganglion cyst. There is a low-grade partial interstitial tear seen of the superior subscapularis tendon measuring approximately 9 mm from the insertion site. There is also globular signal thickening seen throughout the subscapularis and infraspinatus tendons. The teres minor tendon is intact. The muscles of the rotator cuff are normal without tear, edema, or atrophy.  Muscles: The muscles other than the rotator cuff are normal without tear, edema, or atrophy.  Biceps Long Head: Fluid is seen surrounding the extra-articular portion of the long head of the biceps tendon, however it is intact.  Acromioclavicular Joint: Mild AC joint arthrosis seen with capsular hypertrophy. Type II acromion.  Glenohumeral Joint: The glenohumeral joint alignment is well maintained. Moderate to advanced glenohumeral joint osteoarthritis is seen with diffuse chondral thinning most notable within the mid to lower glenohumeral articulation. There is underlying subchondral cystic changes and small marginal osteophyte. A moderate glenohumeral joint effusion is seen.  Labrum: There is diffuse attenuation of the anterior labrum. No displaced labral tear is noted.  Bones: No fracture, osteonecrosis, or pathologic marrow infiltration. Cystic changes seen along the lateral corner of the humeral head.  Other: A small amount of subacromial-subdeltoid bursal fluid is seen.  IMPRESSION: Low-grade partial articular surface tear of the supraspinatus tendon and interstitial tear of the superior subscapularis  tendon.  Small 6 mm probable ganglion cyst seen within the myotendinous junction of the supraspinatus.  Mild to moderate supraspinatus, subscapularis, and infraspinatus tendinosis  Moderate to advanced glenohumeral joint osteoarthrosis and anterior labral degeneration.  Mild AC joint arthrosis and subacromial-subdeltoid bursitis.   Electronically Signed   By: Prudencio Pair M.D.   On: 06/01/2020 23:34   PMFS History: Patient Active Problem List   Diagnosis Date Noted  . Primary osteoarthritis, left shoulder 06/10/2020  . Tendinosis of rotator cuff 06/10/2020  . Protein calorie malnutrition ([place]) 08/13/2019  . 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 Leaking at G tube site 05/12/2019  . Failure to thrive in adult   . Nausea & vomiting   . Hypothermia 04/29/2019  . 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 Symptomatic Anemia 04/25/2017  . Symptomatic anemia 04/25/2017  . Loss of weight   . Dysphagia   . Hx of laparoscopic gastrostomy tube insertion 05/04/2019 09/06/2016  . At risk for adverse drug event 08/25/2016  . Malnutrition following gastric bypass surgery 08/16/2016  . Anastomotic ulcer 07/21/2016  . Transient alteration of awareness   . Alcohol use 04/27/2016  . Chronic pain syndrome 04/27/2016  . Iron deficiency anemia  04/27/2016  . B12 deficiency 04/27/2016  . Biliary anastomotic occlusion   . Hypoglycemia 04/22/2016  . Syncope 04/22/2016  . Protein-calorie malnutrition, severe 04/22/2016  . Intractable nausea and vomiting   . Abdominal pain, chronic, epigastric   . Hypotension 02/01/2016  . Hyponatremia 02/01/2016  . Subacromial impingement of left shoulder 07/06/2013  . Trochanteric bursitis of left hip 05/11/2013  . Arthritis 05/16/2012  . 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/16/2012   Past Medical History:  Diagnosis Date  . Abnormal Pap smear   . Alcohol abuse   . Anemia    IDA  . Anxiety     Takes xanax  . Arthritis    left hip/knees, lower spine  . Bilateral lower extremity edema   . Bipolar disorder ([place])   . Blood transfusion without reported diagnosis last done 04-25-17  . Bulging lumbar disc   . Bursitis    left shoulder  . Chronic lower back pain   . Cut    right middle finger with knife small cut healing pt instructed to keep clean and dry no redness or drainage  . Depression   . Diverticulitis   . Fibromyalgia   . Gastrointestinal tube present ([place])   . GERD (gastroesophageal reflux disease)   . Hypertension    none since weight loss, no medications at this time  . Hypoglycemia   . Lactose intolerance in adult 2017  . Migraines    "weekly @ least" (04/26/2016)  . Seizures ([place])    one Dec. 2017 due to throat closing  . Type II diabetes mellitus ([place])    "before the gastric bypass" (04/26/2016) states she no longer has diabetes    Family History  Problem Relation Age of Onset  . Diabetes Father   . Heart disease Father   . Depression Maternal Grandmother   . Heart disease Maternal Grandfather   . Depression Paternal Grandmother   . Colon cancer Paternal Grandfather   . Pancreatic cancer Paternal Grandfather     Past Surgical History:  Procedure Laterality Date  . BALLOON DILATION N/A 05/27/2016   Procedure: BALLOON DILATION;  Surgeon: Doran Stabler, MD;  Location: [facility];  Service: Gastroenterology;  Laterality: N/A;  . BALLOON DILATION N/A 04/21/2017   Procedure: BALLOON DILATION;  Surgeon: Doran Stabler, MD;  Location: [facility];  Service: Gastroenterology;  Laterality: N/A;  . CERVICAL CONE BIOPSY    . [place]; 1996; 1998; 2014  . CESAREAN SECTION WITH BILATERAL TUBAL LIGATION Bilateral 10/28/2012   Procedure: Repeat cesarean section with delivery of baby boy at 77. Apgars 8/9.  BILATERAL TUBAL LIGATION;  Surgeon: Florian Buff, MD;  Location: [facility];  Service: Obstetrics;  Laterality: Bilateral;  . DILATION AND  CURETTAGE OF UTERUS    . ESOPHAGOGASTRODUODENOSCOPY N/A 05/27/2016   Procedure: ESOPHAGOGASTRODUODENOSCOPY (EGD);  Surgeon: Doran Stabler, MD;  Location: [facility];  Service: Gastroenterology;  Laterality: N/A;  . ESOPHAGOGASTRODUODENOSCOPY (EGD) WITH PROPOFOL N/A 04/23/2016   Procedure: ESOPHAGOGASTRODUODENOSCOPY (EGD) WITH PROPOFOL;  Surgeon: Doran Stabler, MD;  Location: [facility];  Service: Endoscopy;  Laterality: N/A;  . ESOPHAGOGASTRODUODENOSCOPY (EGD) WITH PROPOFOL N/A 01/03/2017   Procedure: ESOPHAGOGASTRODUODENOSCOPY (EGD) WITH PROPOFOL;  Surgeon: Doran Stabler, MD;  [facility];  Service: Gastroenterology;  Laterality: N/A;  . ESOPHAGOGASTRODUODENOSCOPY (EGD) WITH PROPOFOL N/A 04/21/2017   Procedure: ESOPHAGOGASTRODUODENOSCOPY (EGD) WITH PROPOFOL;  Surgeon: Doran Stabler, MD;  Location: [facility];  Service: Gastroenterology;  Laterality: N/A;  . ESOPHAGOGASTRODUODENOSCOPY (EGD) WITH PROPOFOL N/A 05/02/2019   Procedure: ESOPHAGOGASTRODUODENOSCOPY (EGD) WITH PROPOFOL;  Surgeon: Irene Shipper, MD;  Location: [facility];  Service: Endoscopy;  Laterality: N/A;  . GASTROSTOMY N/A 08/16/2016   Procedure: LAPAROSCOPIC INSERTION OF GASTROSTOMY TUBE IN REMNANT STOMACH;  Surgeon: Arta Bruce Kinsinger, MD;  Location: [facility];  Service: General;  Laterality: N/A;  . GASTROSTOMY N/A 05/16/2017   Procedure: Replacement of  GASTROSTOMY TUBE;  Surgeon: Kieth Brightly Arta Bruce, MD;  Location[facility];  Service: General;  Laterality: N/A;  . IR CM INJ ANY COLONIC TUBE W/FLUORO  05/15/2019  . LAPAROSCOPIC INSERTION GASTROSTOMY TUBE Left 05/04/2019   Procedure: LAPAROSCOPIC INSERTION GASTROSTOMY TUBE;  Surgeon: Kieth Brightly Arta Bruce, MD;  Location: [facility];  Service: General;  Laterality: Left;  . LAPAROSCOPIC REVISION OF GASTROJEJUNOSTOMY Left 05/16/2017   Procedure: LAPAROSCOPIC REVISION OF GASTROJEJUNOSTOMY;  Surgeon: Kinsinger, Arta Bruce, MD;  Location: [facility];  Service: General;   Laterality: Left;  . LAPAROSCOPIC REVISION OF ROUXENY WITH UPPER ENDOSCOPY N/A 08/13/2019   Procedure: LAPAROSCOPIC REVISION OF ROUX EN Y WITH UPPER ENDOSCOPY; REMOVAL GASTROSTOMY TUBE, TAKEDOWN OF GASTROJEJUNOSTOMY; RESECTION OF SMALL INTESTINE ERAS PATHWAY;  Surgeon: Kinsinger, Arta Bruce, MD;  Location: [facility];  Service: General;  Laterality: N/A;  . ROUX-EN-Y GASTRIC BYPASS  2007  . TUBAL LIGATION  2014   Social History   Occupational History  . Not on file  Tobacco Use  . Smoking status: Former Smoker    Packs/day: 0.50    Years: 4.00    Pack years: 2.00    Types: Cigarettes    Quit date: 03/16/2012    Years since quitting: 8.2  . Smokeless tobacco: Never Used  Vaping Use  . Vaping Use: Never used  Substance and Sexual Activity  . Alcohol use: Yes    Alcohol/week: 17.0 standard drinks    Types: 6 Glasses of wine, 11 Shots of liquor per week    Comment: occ  . Drug use: No  . Sexual activity: Yes    Birth control/protection: None, Surgical

## 2020-07-04 ENCOUNTER — Other Ambulatory Visit: Payer: Self-pay | Admitting: Orthopedic Surgery

## 2020-07-04 DIAGNOSIS — M25511 Pain in right shoulder: Secondary | ICD-10-CM

## 2020-07-04 DIAGNOSIS — R52 Pain, unspecified: Secondary | ICD-10-CM

## 2020-07-08 ENCOUNTER — Ambulatory Visit: Payer: 59 | Attending: Orthopaedic Surgery

## 2020-07-14 ENCOUNTER — Ambulatory Visit
Admission: RE | Admit: 2020-07-14 | Discharge: 2020-07-14 | Disposition: A | Discharge status: Home / Self Care | Payer: Medicaid Other | Source: Ambulatory Visit | Attending: Orthopedic Surgery | Admitting: Orthopedic Surgery

## 2020-07-14 ENCOUNTER — Other Ambulatory Visit: Payer: Self-pay

## 2020-07-14 DIAGNOSIS — M25511 Pain in right shoulder: Secondary | ICD-10-CM

## 2020-07-14 DIAGNOSIS — R52 Pain, unspecified: Secondary | ICD-10-CM

## 2020-08-18 ENCOUNTER — Encounter: Payer: Self-pay | Admitting: Orthopaedic Surgery

## 2020-08-18 ENCOUNTER — Other Ambulatory Visit: Payer: Self-pay

## 2020-08-18 ENCOUNTER — Ambulatory Visit (INDEPENDENT_AMBULATORY_CARE_PROVIDER_SITE_OTHER): Payer: 59 | Admitting: Orthopaedic Surgery

## 2020-08-18 DIAGNOSIS — M7502 Adhesive capsulitis of left shoulder: Secondary | ICD-10-CM

## 2020-08-18 DIAGNOSIS — M7542 Impingement syndrome of left shoulder: Secondary | ICD-10-CM | POA: Diagnosis not present

## 2020-08-18 NOTE — Addendum Note (Signed)
Addended by: Precious Bard on: 08/18/2020 10:52 AM   Modules accepted: Orders

## 2020-08-18 NOTE — Progress Notes (Signed)
Office Visit Note   Patient: Angie [name]           Date of Birth: April 11, 1966           MRN: [id number] Visit Date: 08/18/2020              Requested by: Harvie Junior, MD [address] [place],  [place] [address] PCP: Harvie Junior, MD   Assessment & Plan: Visit Diagnoses:  1. Subacromial impingement of left shoulder   2. Adhesive capsulitis of left shoulder     Plan: Patient has some left partial rotator cuff tearing subscap and supraspinatus.  She has adhesive capsulitis.  She needs some physical therapy for passive shoulder range of motion.  I will recheck her in 2 months.  Follow-Up Instructions: Return in about 2 months (around 10/18/2020).   Orders:  No orders of the defined types were placed in this encounter.  No orders of the defined types were placed in this encounter.     Procedures: No procedures performed   Clinical Data: No additional findings.   Subjective: Chief Complaint  Patient presents with  . Left Hip - Pain  . Left Shoulder - Pain    HPI 55 year old female returns with ongoing problems with left shoulder pain also some left groin pain.  Previous MRI of her shoulder showed some partial rotator cuff tearing.  Patient has been taking care of 9 and 18-year-old grandchild due to her daughter with drug problems.  She is unable to lift the children put them in a car seat etc.  Increased pain in her shoulder decreased range of motion and she cannot get her left arm past 80 degrees.  Previously saw her in February put an injection and ordered some therapy but she did not go to therapy.  Patient is on chronic pain management herself and takes oxycodone 15 mg 4 times a day.  Patient had previous hip x-rays which are normal.  CT scan abdomen and pelvis was performed.  Lumbar MRI showed mild bulge L5-S1 centrally.  Patient has a cane but has not been using it.  Her activity level has been up since she is taking care of her grandchildren plus she  also an 54-year-old son in the house.  Review of Systems all other systems noncontributory to HPI.   Objective: Vital Signs: There were no vitals taken for this visit.  Physical Exam Constitutional:      Appearance: She is well-developed.  HENT:     Head: Normocephalic.     Right Ear: External ear normal.     Left Ear: External ear normal.  Eyes:     Pupils: Pupils are equal, round, and reactive to light.  Neck:     Thyroid: No thyromegaly.     Trachea: No tracheal deviation.  Cardiovascular:     Rate and Rhythm: Normal rate.  Pulmonary:     Effort: Pulmonary effort is normal.  Abdominal:     Palpations: Abdomen is soft.  Skin:    General: Skin is warm and dry.  Neurological:     Mental Status: She is alert and oriented to person, place, and time.  Psychiatric:        Behavior: Behavior normal.     Ortho Exam patient has negative logroll of the left hip some trochanteric bursal tenderness mild sciatic notch tenderness some pain with straight leg raising at 90 degrees.  Left shoulder abduction to 80 degrees with sharp pain she has tight  resistance to further flexion or abduction.  Good cervical range of motion.  Specialty Comments:  No specialty comments available.  Imaging: No results found.   PMFS History: Patient Active Problem List   Diagnosis Date Noted  . Adhesive capsulitis of left shoulder 08/18/2020  . Primary osteoarthritis, left shoulder 06/10/2020  . Tendinosis of rotator cuff 06/10/2020  . Protein calorie malnutrition ([place]) 08/13/2019  . 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 Leaking at G tube site 05/12/2019  . Failure to thrive in adult   . Nausea & vomiting   . Hypothermia 04/29/2019  . 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 Symptomatic Anemia 04/25/2017  . Symptomatic anemia 04/25/2017  . Loss of weight   . Dysphagia   . Hx of laparoscopic gastrostomy tube insertion 05/04/2019 09/06/2016  . At  risk for adverse drug event 08/25/2016  . Malnutrition following gastric bypass surgery 08/16/2016  . Anastomotic ulcer 07/21/2016  . Transient alteration of awareness   . Alcohol use 04/27/2016  . Chronic pain syndrome 04/27/2016  . Iron deficiency anemia 04/27/2016  . B12 deficiency 04/27/2016  . Biliary anastomotic occlusion   . Hypoglycemia 04/22/2016  . Syncope 04/22/2016  . Protein-calorie malnutrition, severe 04/22/2016  . Intractable nausea and vomiting   . Abdominal pain, chronic, epigastric   . Hypotension 02/01/2016  . Hyponatremia 02/01/2016  . Subacromial impingement of left shoulder 07/06/2013  . Trochanteric bursitis of left hip 05/11/2013  . Arthritis 05/16/2012  . 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/16/2012   Past Medical History:  Diagnosis Date  . Abnormal Pap smear   . Alcohol abuse   . Anemia    IDA  . Anxiety    Takes xanax  . Arthritis    left hip/knees, lower spine  . Bilateral lower extremity edema   . Bipolar disorder ([place])   . Blood transfusion without reported diagnosis last done 04-25-17  . Bulging lumbar disc   . Bursitis    left shoulder  . Chronic lower back pain   . Cut    right middle finger with knife small cut healing pt instructed to keep clean and dry no redness or drainage  . Depression   . Diverticulitis   . Fibromyalgia   . Gastrointestinal tube present ([place])   . GERD (gastroesophageal reflux disease)   . Hypertension    none since weight loss, no medications at this time  . Hypoglycemia   . Lactose intolerance in adult 2017  . Migraines    "weekly @ least" (04/26/2016)  . Seizures ([place])    one Dec. 2017 due to throat closing  . Type II diabetes mellitus ([place])    "before the gastric bypass" (04/26/2016) states she no longer has diabetes    Family History  Problem Relation Age of Onset  . Diabetes Father   . Heart disease Father   . Depression Maternal Grandmother   . Heart disease Maternal Grandfather   .  Depression Paternal Grandmother   . Colon cancer Paternal Grandfather   . Pancreatic cancer Paternal Grandfather     Past Surgical History:  Procedure Laterality Date  . BALLOON DILATION N/A 05/27/2016   Procedure: BALLOON DILATION;  Surgeon: Doran Stabler, MD;  Location: [facility];  Service: Gastroenterology;  Laterality: N/A;  . BALLOON DILATION N/A 04/21/2017   Procedure: BALLOON DILATION;  Surgeon: Doran Stabler, MD;  Location: [facility];  Service: Gastroenterology;  Laterality: N/A;  . CERVICAL  CONE BIOPSY    . [place]; 1996; 1998; 2014  . CESAREAN SECTION WITH BILATERAL TUBAL LIGATION Bilateral 10/28/2012   Procedure: Repeat cesarean section with delivery of baby boy at 93. Apgars 8/9.  BILATERAL TUBAL LIGATION;  Surgeon: Florian Buff, MD;  Location: [facility];  Service: Obstetrics;  Laterality: Bilateral;  . DILATION AND CURETTAGE OF UTERUS    . ESOPHAGOGASTRODUODENOSCOPY N/A 05/27/2016   Procedure: ESOPHAGOGASTRODUODENOSCOPY (EGD);  Surgeon: Doran Stabler, MD;  Location: [facility];  Service: Gastroenterology;  Laterality: N/A;  . ESOPHAGOGASTRODUODENOSCOPY (EGD) WITH PROPOFOL N/A 04/23/2016   Procedure: ESOPHAGOGASTRODUODENOSCOPY (EGD) WITH PROPOFOL;  Surgeon: Doran Stabler, MD;  Location: [address];  Service: Endoscopy;  Laterality: N/A;  . ESOPHAGOGASTRODUODENOSCOPY (EGD) WITH PROPOFOL N/A 01/03/2017   Procedure: ESOPHAGOGASTRODUODENOSCOPY (EGD) WITH PROPOFOL;  Surgeon: Doran Stabler, MD;  Location: [facility];  Service: Gastroenterology;  Laterality: N/A;  . ESOPHAGOGASTRODUODENOSCOPY (EGD) WITH PROPOFOL N/A 04/21/2017   Procedure: ESOPHAGOGASTRODUODENOSCOPY (EGD) WITH PROPOFOL;  Surgeon: Doran Stabler, MD;  Location: [facility];  Service: Gastroenterology;  Laterality: N/A;  . ESOPHAGOGASTRODUODENOSCOPY (EGD) WITH PROPOFOL N/A 05/02/2019   Procedure: ESOPHAGOGASTRODUODENOSCOPY (EGD) WITH PROPOFOL;  Surgeon: Irene Shipper, MD;   Location: [facility];  Service: Endoscopy;  Laterality: N/A;  . GASTROSTOMY N/A 08/16/2016   Procedure: LAPAROSCOPIC INSERTION OF GASTROSTOMY TUBE IN REMNANT STOMACH;  Surgeon: Arta Bruce Kinsinger, MD;  Location: [facility];  Service: General;  Laterality: N/A;  . GASTROSTOMY N/A 05/16/2017   Procedure: Replacement of  GASTROSTOMY TUBE;  Surgeon: Kieth Brightly Arta Bruce, MD;  Location: [facility];  Service: General;  Laterality: N/A;  . IR CM INJ ANY COLONIC TUBE W/FLUORO  05/15/2019  . LAPAROSCOPIC INSERTION GASTROSTOMY TUBE Left 05/04/2019   Procedure: LAPAROSCOPIC INSERTION GASTROSTOMY TUBE;  Surgeon: Kieth Brightly Arta Bruce, MD;  Location: [facility];  Service: General;  Laterality: Left;  . LAPAROSCOPIC REVISION OF GASTROJEJUNOSTOMY Left 05/16/2017   Procedure: LAPAROSCOPIC REVISION OF GASTROJEJUNOSTOMY;  Surgeon: Kinsinger, Arta Bruce, MD;  Location: [facility];  Service: General;  Laterality: Left;  . LAPAROSCOPIC REVISION OF [place] WITH UPPER ENDOSCOPY N/A 08/13/2019   Procedure: LAPAROSCOPIC REVISION OF [place] WITH UPPER ENDOSCOPY; REMOVAL GASTROSTOMY TUBE, TAKEDOWN OF GASTROJEJUNOSTOMY; RESECTION OF SMALL INTESTINE ERAS PATHWAY;  Surgeon: Kinsinger, Arta Bruce, MD;  Location: [facility];  Service: General;  Laterality: N/A;  . ROUX-EN-Y GASTRIC BYPASS  2007  . TUBAL LIGATION  2014   Social History   Occupational History  . Not on file  Tobacco Use  . Smoking status: Former Smoker    Packs/day: 0.50    Years: 4.00    Pack years: 2.00    Types: Cigarettes    Quit date: 03/16/2012    Years since quitting: 8.4  . Smokeless tobacco: Never Used  Vaping Use  . Vaping Use: Never used  Substance and Sexual Activity  . Alcohol use: Yes    Alcohol/week: 17.0 standard drinks    Types: 6 Glasses of wine, 11 Shots of liquor per week    Comment: occ  . Drug use: No  . Sexual activity: Yes    Birth control/protection: None, Surgical

## 2020-09-08 ENCOUNTER — Ambulatory Visit: Payer: 59 | Attending: Orthopaedic Surgery | Admitting: Physical Therapy

## 2020-09-08 DIAGNOSIS — G8929 Other chronic pain: Secondary | ICD-10-CM | POA: Insufficient documentation

## 2020-09-08 DIAGNOSIS — M25612 Stiffness of left shoulder, not elsewhere classified: Secondary | ICD-10-CM | POA: Insufficient documentation

## 2020-09-08 DIAGNOSIS — M25512 Pain in left shoulder: Secondary | ICD-10-CM | POA: Insufficient documentation

## 2020-09-08 DIAGNOSIS — M6281 Muscle weakness (generalized): Secondary | ICD-10-CM | POA: Insufficient documentation

## 2020-09-10 ENCOUNTER — Ambulatory Visit: Payer: 59

## 2020-09-10 ENCOUNTER — Other Ambulatory Visit: Payer: Self-pay

## 2020-09-10 DIAGNOSIS — M6281 Muscle weakness (generalized): Secondary | ICD-10-CM | POA: Diagnosis present

## 2020-09-10 DIAGNOSIS — G8929 Other chronic pain: Secondary | ICD-10-CM

## 2020-09-10 DIAGNOSIS — M25612 Stiffness of left shoulder, not elsewhere classified: Secondary | ICD-10-CM

## 2020-09-10 DIAGNOSIS — M25512 Pain in left shoulder: Secondary | ICD-10-CM
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
## 2020-09-18 ENCOUNTER — Ambulatory Visit (INDEPENDENT_AMBULATORY_CARE_PROVIDER_SITE_OTHER): Payer: 59 | Admitting: Surgery

## 2020-09-18 ENCOUNTER — Encounter: Payer: Self-pay | Admitting: Surgery

## 2020-09-18 DIAGNOSIS — G8929 Other chronic pain: Secondary | ICD-10-CM

## 2020-09-18 DIAGNOSIS — M25512 Pain in left shoulder: Secondary | ICD-10-CM

## 2020-09-18 NOTE — Progress Notes (Signed)
55 year old black female returns with complaints of left shoulder pain.  She had previous left shoulder MRI scan which showed:   EXAM: MRI OF THE LEFT SHOULDER WITHOUT CONTRAST  TECHNIQUE: Multiplanar, multisequence MR imaging of the shoulder was performed. No intravenous contrast was administered.  COMPARISON:  None.  FINDINGS: Rotator cuff: There is a low-grade partial articular surface tear seen of the anterior supraspinatus tendon at the insertion site measuring 6 mm in AP dimension. Increased globular signal thickening seen throughout the remainder of the supraspinatus tendon. There is also a 6 mm tiny cyst seen within the myotendinous junction of the supraspinatus tendon, likely small ganglion cyst. There is a low-grade partial interstitial tear seen of the superior subscapularis tendon measuring approximately 9 mm from the insertion site. There is also globular signal thickening seen throughout the subscapularis and infraspinatus tendons. The teres minor tendon is intact. The muscles of the rotator cuff are normal without tear, edema, or atrophy.  Muscles: The muscles other than the rotator cuff are normal without tear, edema, or atrophy.  Biceps Long Head: Fluid is seen surrounding the extra-articular portion of the long head of the biceps tendon, however it is intact.  Acromioclavicular Joint: Mild AC joint arthrosis seen with capsular hypertrophy. Type II acromion.  Glenohumeral Joint: The glenohumeral joint alignment is well maintained. Moderate to advanced glenohumeral joint osteoarthritis is seen with diffuse chondral thinning most notable within the mid to lower glenohumeral articulation. There is underlying subchondral cystic changes and small marginal osteophyte. A moderate glenohumeral joint effusion is seen.  Labrum: There is diffuse attenuation of the anterior labrum. No displaced labral tear is noted.  Bones: No fracture, osteonecrosis, or  pathologic marrow infiltration. Cystic changes seen along the lateral corner of the humeral head.  Other: A small amount of subacromial-subdeltoid bursal fluid is seen.  IMPRESSION: Low-grade partial articular surface tear of the supraspinatus tendon and interstitial tear of the superior subscapularis tendon.  Small 6 mm probable ganglion cyst seen within the myotendinous junction of the supraspinatus.  Mild to moderate supraspinatus, subscapularis, and infraspinatus tendinosis  Moderate to advanced glenohumeral joint osteoarthrosis and anterior labral degeneration.  Mild AC joint arthrosis and subacromial-subdeltoid bursitis.   Electronically Signed   By: Prudencio Pair M.D.   On: 06/01/2020 23:34  Not days performed a subacromial Marcaine/steroid injection February 2022.  Patient also has been going to formal PT twice a week.  She continues to have ongoing pain in her shoulder with overactivity.  Patient came in today requesting another injection.    Plan Today advised patient that I do not recommend repeating any more injections at this point since she is already failed conservative treatment with the previous one along with physical therapy.  I advised her she may end up needing surgical intervention with left shoulder arthroscopy and debridement and possible mini open rotator cuff repair.  I will have patient follow-up in 1 week with Dr. Lorin Mercy and he can discuss possible surgical options if he feels that this is indicated.  All questions answered.

## 2020-09-23 ENCOUNTER — Telehealth: Payer: Self-pay

## 2020-09-23 ENCOUNTER — Ambulatory Visit: Payer: 59

## 2020-09-23 NOTE — Telephone Encounter (Signed)
PT called and spoke with patient regarding missed visit. After discussion, decided to hold therapy until after patient visit with Dr. Lorin Mercy on 09/30/20. If surgery is decided, will cancel remaining visits at that time.  Ward Chatters, PT, DPT 09/23/20 4:56 PM

## 2020-09-25 ENCOUNTER — Ambulatory Visit: Admitting: Physical Therapy

## 2020-09-30 ENCOUNTER — Ambulatory Visit: Admitting: Orthopaedic Surgery

## 2020-10-02 ENCOUNTER — Ambulatory Visit: Admitting: Physical Therapy

## 2020-10-06 ENCOUNTER — Telehealth: Payer: Self-pay

## 2020-10-06 NOTE — Telephone Encounter (Signed)
Pt called and LVM regarding missed visit. Remaining visit cancelled with exception of next one on 10/09/20.  Ward Chatters, PT, DPT 10/06/20 5:17 PM

## 2020-10-09 ENCOUNTER — Ambulatory Visit: Attending: Orthopaedic Surgery

## 2020-10-13 ENCOUNTER — Encounter: Payer: Self-pay | Admitting: Orthopaedic Surgery

## 2020-10-13 ENCOUNTER — Ambulatory Visit (INDEPENDENT_AMBULATORY_CARE_PROVIDER_SITE_OTHER): Payer: 59 | Admitting: Orthopaedic Surgery

## 2020-10-13 VITALS — HR 67 | Ht 60.0 in | Wt 156.0 lb

## 2020-10-13 DIAGNOSIS — M7542 Impingement syndrome of left shoulder: Secondary | ICD-10-CM | POA: Diagnosis not present

## 2020-10-15 NOTE — Progress Notes (Signed)
Office Visit Note   Patient: Angie [name]           Date of Birth: 05-Jan-1966           MRN: [id number] Visit Date: 10/13/2020              Requested by: Harvie Junior, MD [address] [place],  [place] [phone] PCP: Harvie Junior, MD   Assessment & Plan: Visit Diagnoses:  1. Subacromial impingement of left shoulder     Plan: We discussed some of her symptoms may be related to hyperalgesia.  She has some tendinosis in both right and left shoulder and the rotator cuff.  Left shoulder has 3 of the 4 tendons involved.  We discussed in detail that she does not have a full-thickness tear and although she does have some tendinosis in her shoulder rotator cuff tendons remain intact.  Follow-Up Instructions: Return if symptoms worsen or fail to improve.   Orders:  No orders of the defined types were placed in this encounter.  No orders of the defined types were placed in this encounter.     Procedures: No procedures performed   Clinical Data: No additional findings.   Subjective: Chief Complaint  Patient presents with   Left Shoulder - Pain    HPI 55 year old female with chronic shoulder pain both left and right.  She is here to review once again her previous scan of her left shoulder patient had some articular surface supraspinatus tendinosis with some partial tearing.  Moderate supraspinatus, subscapularis and infraspinatus tendinosis but her teres minor was normal.  She has some moderate osteoarthritis of her shoulder.  Some mild acromioclavicular changes and subdeltoid fluid.  Dr. Lorre Nick had seen her about her opposite right shoulder and MRI showed some rotator cuff tendinosis low-grade changes similar to the left shoulder.  She states conservative treatments been recommended.  She remains on chronic pain management times greater than 5 years currently on oxycodone 15 mg 4 tablets a day.  She states she still has considerable pain in her shoulders.  Neither right  or left shoulder shows full-thickness rotator cuff tear.  Review of Systems updated unchanged from previous visit.  Previous Roux-en-Y gastric bypass 2007.  Shoulder impingement, chronic pain, chronic pain management,  All noted.  Objective: Vital Signs: Pulse 67   Ht 5' (1.524 m)   Wt 156 lb (70.8 kg)   BMI 30.47 kg/m   Physical Exam Constitutional:      Appearance: She is well-developed.  HENT:     Head: Normocephalic.     Right Ear: External ear normal.     Left Ear: External ear normal. There is no impacted cerumen.  Eyes:     Pupils: Pupils are equal, round, and reactive to light.  Neck:     Thyroid: No thyromegaly.     Trachea: No tracheal deviation.  Cardiovascular:     Rate and Rhythm: Normal rate.  Pulmonary:     Effort: Pulmonary effort is normal.  Abdominal:     Palpations: Abdomen is soft.  Musculoskeletal:     Cervical back: No rigidity.  Skin:    General: Skin is warm and dry.  Neurological:     Mental Status: She is alert and oriented to person, place, and time.  Psychiatric:        Behavior: Behavior normal.    Ortho Exam patient has mild impingement positive Neer negative Hawkins.  Mild brachial plexus tenderness.  Upper extremity reflexes  are 2+ and symmetrical.  Specialty Comments:  No specialty comments available.  Imaging: CLINICAL DATA:  Chronic shoulder pain   EXAM: MRI OF THE LEFT SHOULDER WITHOUT CONTRAST   TECHNIQUE: Multiplanar, multisequence MR imaging of the shoulder was performed. No intravenous contrast was administered.   COMPARISON:  None.   FINDINGS: Rotator cuff: There is a low-grade partial articular surface tear seen of the anterior supraspinatus tendon at the insertion site measuring 6 mm in AP dimension. Increased globular signal thickening seen throughout the remainder of the supraspinatus tendon. There is also a 6 mm tiny cyst seen within the myotendinous junction of the supraspinatus tendon, likely small ganglion  cyst. There is a low-grade partial interstitial tear seen of the superior subscapularis tendon measuring approximately 9 mm from the insertion site. There is also globular signal thickening seen throughout the subscapularis and infraspinatus tendons. The teres minor tendon is intact. The muscles of the rotator cuff are normal without tear, edema, or atrophy.   Muscles: The muscles other than the rotator cuff are normal without tear, edema, or atrophy.   Biceps Long Head: Fluid is seen surrounding the extra-articular portion of the long head of the biceps tendon, however it is intact.   Acromioclavicular Joint: Mild AC joint arthrosis seen with capsular hypertrophy. Type II acromion.   Glenohumeral Joint: The glenohumeral joint alignment is well maintained. Moderate to advanced glenohumeral joint osteoarthritis is seen with diffuse chondral thinning most notable within the mid to lower glenohumeral articulation. There is underlying subchondral cystic changes and small marginal osteophyte. A moderate glenohumeral joint effusion is seen.   Labrum: There is diffuse attenuation of the anterior labrum. No displaced labral tear is noted.   Bones: No fracture, osteonecrosis, or pathologic marrow infiltration. Cystic changes seen along the lateral corner of the humeral head.   Other: A small amount of subacromial-subdeltoid bursal fluid is seen.   IMPRESSION: Low-grade partial articular surface tear of the supraspinatus tendon and interstitial tear of the superior subscapularis tendon.   Small 6 mm probable ganglion cyst seen within the myotendinous junction of the supraspinatus.   Mild to moderate supraspinatus, subscapularis, and infraspinatus tendinosis   Moderate to advanced glenohumeral joint osteoarthrosis and anterior labral degeneration.   Mild AC joint arthrosis and subacromial-subdeltoid bursitis.     Electronically Signed   By: Prudencio Pair M.D.   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 05/16/2017   Symptomatic Anemia 04/25/2017   Symptomatic anemia 04/25/2017   Loss of weight    Dysphagia    Hx of laparoscopic gastrostomy tube insertion 05/04/2019 09/06/2016   At risk for adverse drug event 08/25/2016   Malnutrition following gastric bypass surgery 08/16/2016   Anastomotic ulcer 07/21/2016   Transient alteration of awareness    Alcohol use 04/27/2016   Chronic pain syndrome 04/27/2016   Iron deficiency anemia 04/27/2016   B12 deficiency 04/27/2016   Biliary anastomotic occlusion    Hypoglycemia 04/22/2016   Syncope 04/22/2016   Protein-calorie malnutrition, severe 04/22/2016   Intractable  nausea and vomiting    Abdominal pain, chronic, epigastric    Hypotension 02/01/2016   Hyponatremia 02/01/2016   Subacromial impingement of left shoulder 07/06/2013   Trochanteric bursitis of left hip 05/11/2013   Arthritis 05/16/2012   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/16/2012   Past Medical History:  Diagnosis Date   Abnormal Pap smear    Alcohol abuse    Anemia    IDA   Anxiety    Takes xanax   Arthritis    left hip/knees, lower spine   Bilateral lower extremity edema    Bipolar disorder ([place])    Blood transfusion without reported diagnosis last done 04-25-17   Bulging lumbar disc    Bursitis    left shoulder   Chronic lower back pain    Cut    right middle finger with knife small cut healing pt instructed to keep clean and dry no redness or drainage   Depression     Diverticulitis    Fibromyalgia    Gastrointestinal tube present (HCC)    GERD (gastroesophageal reflux disease)    Hypertension    none since weight loss, no medications at this time   Hypoglycemia    Lactose intolerance in adult 2017   Migraines    "weekly @ least" (04/26/2016)   Seizures ([place])    one Dec. 2017 due to throat closing   Type II diabetes mellitus ([place])    "before the gastric bypass" (04/26/2016) states she no longer has diabetes    Family History  Problem Relation Age of Onset   Diabetes Father    Heart disease Father    Depression Maternal Grandmother    Heart disease Maternal Grandfather    Depression Paternal Grandmother    Colon cancer Paternal Grandfather    Pancreatic cancer Paternal Grandfather     Past Surgical History:  Procedure Laterality Date   BALLOON DILATION N/A 05/27/2016   Procedure: BALLOON DILATION;  Surgeon: Nelida Meuse III, MD;  Location: [facility];  Service: Gastroenterology;  Laterality: N/A;   BALLOON DILATION N/A 04/21/2017   Procedure: BALLOON DILATION;  Surgeon: Doran Stabler, MD;  Location: [facility];  Service: Gastroenterology;  Laterality: N/A;   CERVICAL CONE BIOPSY     CESAREAN SECTION  1993; 1996; 1998; 2014   CESAREAN SECTION WITH BILATERAL TUBAL LIGATION Bilateral 10/28/2012   Procedure: Repeat cesarean section with delivery of baby boy at 1210. Apgars 8/9.  BILATERAL TUBAL LIGATION;  Surgeon: Florian Buff, MD;  Location: [facility];  Service: Obstetrics;  Laterality: Bilateral;   DILATION AND CURETTAGE OF UTERUS     ESOPHAGOGASTRODUODENOSCOPY N/A 05/27/2016   Procedure: ESOPHAGOGASTRODUODENOSCOPY (EGD);  Surgeon: Doran Stabler, MD;  Location: [facility];  Service: Gastroenterology;  Laterality: N/A;   ESOPHAGOGASTRODUODENOSCOPY (EGD) WITH PROPOFOL N/A 04/23/2016   Procedure: ESOPHAGOGASTRODUODENOSCOPY (EGD) WITH PROPOFOL;  Surgeon: Doran Stabler, MD;  Location: [facility];  Service: Endoscopy;  Laterality: N/A;    ESOPHAGOGASTRODUODENOSCOPY (EGD) WITH PROPOFOL N/A 01/03/2017   Procedure: ESOPHAGOGASTRODUODENOSCOPY (EGD) WITH PROPOFOL;  Surgeon: Doran Stabler, MD;  [facility]: [facility];  Service: Gastroenterology;  Laterality: N/A;   ESOPHAGOGASTRODUODENOSCOPY (EGD) WITH PROPOFOL N/A 04/21/2017   Procedure: ESOPHAGOGASTRODUODENOSCOPY (EGD) WITH PROPOFOL;  Surgeon: Doran Stabler, MD;  Location: [facility];  Service: Gastroenterology;  Laterality: N/A;   ESOPHAGOGASTRODUODENOSCOPY (EGD) WITH PROPOFOL N/A 05/02/2019   Procedure: ESOPHAGOGASTRODUODENOSCOPY (EGD) WITH PROPOFOL;  Surgeon: Irene Shipper, MD;  Location: [facility];  Service: Endoscopy;  Laterality: N/A;   GASTROSTOMY N/A 08/16/2016   Procedure: LAPAROSCOPIC INSERTION OF GASTROSTOMY TUBE IN REMNANT STOMACH;  Surgeon: Arta Bruce Kinsinger, MD;  Location: [facility];  Service: General;  Laterality: N/A;   GASTROSTOMY N/A 05/16/2017   Procedure: Replacement of  GASTROSTOMY TUBE;  Surgeon: [name], MD;  Location: [facility];  Service: General;  Laterality: N/A;   IR CM INJ ANY COLONIC TUBE W/FLUORO  05/15/2019   LAPAROSCOPIC INSERTION GASTROSTOMY TUBE Left 05/04/2019   Procedure: LAPAROSCOPIC INSERTION GASTROSTOMY TUBE;  Surgeon: [name], MD;  Location: [facility];  Service: General;  Laterality: Left;   LAPAROSCOPIC REVISION OF GASTROJEJUNOSTOMY Left 05/16/2017   Procedure: [place];  Surgeon: [name], MD;  Location: [facility];  Service: General;  Laterality: Left;   LAPAROSCOPIC REVISION OF [place] WITH UPPER ENDOSCOPY N/A 08/13/2019   Procedure: LAPAROSCOPIC REVISION OF ROUX EN Y WITH UPPER ENDOSCOPY; REMOVAL GASTROSTOMY TUBE, TAKEDOWN OF GASTROJEJUNOSTOMY; RESECTION OF SMALL INTESTINE ERAS PATHWAY;  Surgeon: Kinsinger, Arta Bruce, MD;  Location: [facility];  Service: General;  Laterality: N/A;   ROUX-EN-Y GASTRIC BYPASS  2007   TUBAL LIGATION  2014   Social History   Occupational  History   Not on file  Tobacco Use   Smoking status: Former    Packs/day: 0.50    Years: 4.00    Pack years: 2.00    Types: Cigarettes    Quit date: 03/16/2012    Years since quitting: 8.5   Smokeless tobacco: Never  Vaping Use   Vaping Use: Never used  Substance and Sexual Activity   Alcohol use: Yes    Alcohol/week: 17.0 standard drinks    Types: 6 Glasses of wine, 11 Shots of liquor per week    Comment: occ   Drug use: No   Sexual activity: Yes    Birth control/protection: None, Surgical

## 2021-01-14 ENCOUNTER — Ambulatory Visit (INDEPENDENT_AMBULATORY_CARE_PROVIDER_SITE_OTHER): Payer: 59 | Admitting: Orthopaedic Surgery

## 2021-01-14 ENCOUNTER — Encounter: Payer: Self-pay | Admitting: Orthopaedic Surgery

## 2021-01-14 VITALS — BP 106/69 | HR 112 | Ht 60.0 in | Wt 164.0 lb

## 2021-01-14 DIAGNOSIS — M67812 Other specified disorders of synovium, left shoulder: Secondary | ICD-10-CM

## 2021-01-14 DIAGNOSIS — M67819 Other specified disorders of synovium and tendon, unspecified shoulder: Secondary | ICD-10-CM

## 2021-01-14 MED ORDER — METHYLPREDNISOLONE ACETATE 40 MG/ML IJ SUSP
40.0000 mg | INTRAMUSCULAR | Status: AC | PRN
  Administered 2021-01-14: 40 mg via INTRA_ARTICULAR

## 2021-01-14 MED ORDER — BUPIVACAINE HCL 0.25 % IJ SOLN
4.0000 mL | INTRAMUSCULAR | Status: AC | PRN
  Administered 2021-01-14: 4 mL via INTRA_ARTICULAR

## 2021-01-14 MED ORDER — LIDOCAINE HCL 1 % IJ SOLN
0.5000 mL | INTRAMUSCULAR | Status: AC | PRN
  Administered 2021-01-14: .5 mL

## 2021-01-14 NOTE — Progress Notes (Signed)
Office Visit Note   Patient: Angie [name]           Date of Birth: [birth year]           MRN: [id number] Visit Date: 01/14/2021              Requested by: Harvie Junior, MD [address] [place],  [place] [address] PCP: Harvie Junior, MD   Assessment & Plan: Visit Diagnoses:  1. Tendinosis of rotator cuff     Plan: Left subacromial injection performed follow-up as needed we discussed activity she should try to avoid which may overload the tendon which already has tendinosis.  Follow-Up Instructions: No follow-ups on file.   Orders:  Orders Placed This Encounter  Procedures   Large Joint Inj   No orders of the defined types were placed in this encounter.     Procedures: Large Joint Inj: L subacromial bursa on 01/14/2021 10:31 AM Indications: pain Details: 22 G 1.5 in needle  Arthrogram: No  Medications: 4 mL bupivacaine 0.25 %; 40 mg methylPREDNISolone acetate 40 MG/ML; 0.5 mL lidocaine 1 % Outcome: tolerated well, no immediate complications Procedure, treatment alternatives, risks and benefits explained, specific risks discussed. Consent was given by the patient. Immediately prior to procedure a time out was called to verify the correct patient, procedure, equipment, support staff and site/side marked as required. Patient was prepped and draped in the usual sterile fashion.      Clinical Data: No additional findings.   Subjective: Chief Complaint  Patient presents with   Left Shoulder - Pain    HPI 55 year old female returns with recurrent problems with left shoulder tendinopathy.  Involvement 3 of the 4 tendons without full-thickness tear.  Similar problems with her opposite right shoulder.  Previous injection in February gave her excellent relief for several months.  She was taking care of her grandchildren who have now gone back to school and she is no longer lifting them in the car seats, and a booster seats etc.  She thinks some of this may  have aggravated her pain symptoms.  She remains on chronic pain management oxycodone 15 mg 4 times a day.  Review of Systems all other systems updated unchanged.  Gastric bypass 2007.   Objective: Vital Signs: BP 106/69   Pulse (!) 112   Ht 5' (1.524 m)   Wt 164 lb (74.4 kg)   BMI 32.03 kg/m   Physical Exam Constitutional:      Appearance: She is well-developed.  HENT:     Head: Normocephalic.     Right Ear: External ear normal.     Left Ear: External ear normal. There is no impacted cerumen.  Eyes:     Pupils: Pupils are equal, round, and reactive to light.  Neck:     Thyroid: No thyromegaly.     Trachea: No tracheal deviation.  Cardiovascular:     Rate and Rhythm: Normal rate.  Pulmonary:     Effort: Pulmonary effort is normal.  Abdominal:     Palpations: Abdomen is soft.  Musculoskeletal:     Cervical back: No rigidity.  Skin:    General: Skin is warm and dry.  Neurological:     Mental Status: She is alert and oriented to person, place, and time.  Psychiatric:        Behavior: Behavior normal.    Ortho Exam positive impingement left shoulder.  Station hand is intact good cervical range of motion.  Specialty Comments:  No specialty comments available.  Imaging: No results found.   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 05/16/2017   Symptomatic Anemia 04/25/2017   Symptomatic anemia 04/25/2017   Loss of weight    Dysphagia    Hx of laparoscopic gastrostomy tube insertion 05/04/2019 09/06/2016   At risk for adverse drug  event 08/25/2016   Malnutrition following gastric bypass surgery 08/16/2016   Anastomotic ulcer 07/21/2016   Transient alteration of awareness    Alcohol use 04/27/2016   Chronic pain syndrome 04/27/2016   Iron deficiency anemia 04/27/2016   B12 deficiency 04/27/2016   Biliary anastomotic occlusion    Hypoglycemia 04/22/2016   Syncope 04/22/2016   Protein-calorie malnutrition, severe 04/22/2016   Intractable nausea and vomiting    Abdominal pain, chronic, epigastric    Hypotension 02/01/2016   Hyponatremia 02/01/2016   Subacromial impingement of left shoulder 07/06/2013   Trochanteric bursitis of left hip 05/11/2013   Arthritis 05/16/2012   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/16/2012   Past Medical History:  Diagnosis Date   Abnormal Pap smear    Alcohol abuse    Anemia    IDA   Anxiety    Takes xanax   Arthritis    left hip/knees, lower spine   Bilateral lower extremity edema    Bipolar disorder ([place])    Blood transfusion without reported diagnosis last done 04-25-17   Bulging lumbar disc    Bursitis    left shoulder   Chronic lower back pain    Cut    right middle finger with knife small cut healing pt instructed to keep clean and dry no redness or drainage   Depression    Diverticulitis    Fibromyalgia    Gastrointestinal tube present (HCC)    GERD (gastroesophageal reflux disease)    Hypertension    none since weight loss, no medications at this time   Hypoglycemia    Lactose intolerance in adult 2017   Migraines    "weekly @ least" (04/26/2016)   Seizures ([place])    one Dec. 2017 due to throat closing   Type II diabetes mellitus ([place])    "before the gastric bypass" (04/26/2016) states she no longer has diabetes    Family History  Problem Relation Age of Onset   Diabetes Father    Heart disease Father    Depression Maternal Grandmother    Heart disease Maternal Grandfather    Depression Paternal Grandmother    Colon cancer Paternal Grandfather     Pancreatic cancer Paternal Grandfather     Past Surgical History:  Procedure Laterality Date   BALLOON DILATION N/A 05/27/2016   Procedure: BALLOON DILATION;  Surgeon: Nelida Meuse III, MD;  Location: [facility];  Service: Gastroenterology;  Laterality: N/A;   BALLOON DILATION N/A 04/21/2017   Procedure: BALLOON DILATION;  Surgeon: Doran Stabler, MD;  Location: [facility];  Service: Gastroenterology;  Laterality: N/A;   CERVICAL CONE BIOPSY     CESAREAN SECTION  1993; 1996; 1998; 2014   CESAREAN  SECTION WITH BILATERAL TUBAL LIGATION Bilateral 10/28/2012   Procedure: Repeat cesarean section with delivery of baby boy at 69. Apgars 8/9.  BILATERAL TUBAL LIGATION;  Surgeon: Florian Buff, MD;  Location: [facility];  Service: Obstetrics;  Laterality: Bilateral;   DILATION AND CURETTAGE OF UTERUS     ESOPHAGOGASTRODUODENOSCOPY N/A 05/27/2016   Procedure: ESOPHAGOGASTRODUODENOSCOPY (EGD);  Surgeon: Doran Stabler, MD;  Location: [facility];  Service: Gastroenterology;  Laterality: N/A;   ESOPHAGOGASTRODUODENOSCOPY (EGD) WITH PROPOFOL N/A 04/23/2016   Procedure: ESOPHAGOGASTRODUODENOSCOPY (EGD) WITH PROPOFOL;  Surgeon: Doran Stabler, MD;  Location: [facility];  Service: Endoscopy;  Laterality: N/A;   ESOPHAGOGASTRODUODENOSCOPY (EGD) WITH PROPOFOL N/A 01/03/2017   Procedure: ESOPHAGOGASTRODUODENOSCOPY (EGD) WITH PROPOFOL;  Surgeon: Doran Stabler, MD;  Location: [facility];  Service: Gastroenterology;  Laterality: N/A;   ESOPHAGOGASTRODUODENOSCOPY (EGD) WITH PROPOFOL N/A 04/21/2017   Procedure: ESOPHAGOGASTRODUODENOSCOPY (EGD) WITH PROPOFOL;  Surgeon: Doran Stabler, MD;  Location: [facility];  Service: Gastroenterology;  Laterality: N/A;   ESOPHAGOGASTRODUODENOSCOPY (EGD) WITH PROPOFOL N/A 05/02/2019   Procedure: ESOPHAGOGASTRODUODENOSCOPY (EGD) WITH PROPOFOL;  Surgeon: Irene Shipper, MD;  Location: [facility];  Service: Endoscopy;  Laterality: N/A;   GASTROSTOMY N/A 08/16/2016    Procedure: LAPAROSCOPIC INSERTION OF GASTROSTOMY TUBE IN REMNANT STOMACH;  Surgeon: Arta Bruce Kinsinger, MD;  Location: [facility];  Service: General;  Laterality: N/A;   GASTROSTOMY N/A 05/16/2017   Procedure: Replacement of  GASTROSTOMY TUBE;  Surgeon: [name], MD;  Location: [facility];  Service: General;  Laterality: N/A;   IR CM INJ ANY COLONIC TUBE W/FLUORO  05/15/2019   LAPAROSCOPIC INSERTION GASTROSTOMY TUBE Left 05/04/2019   Procedure: LAPAROSCOPIC INSERTION GASTROSTOMY TUBE;  Surgeon: [name], MD;  Location: [facility];  Service: General;  Laterality: Left;   LAPAROSCOPIC REVISION OF GASTROJEJUNOSTOMY Left 05/16/2017   Procedure: [place];  Surgeon: [name], MD;  Location: [facility];  Service: General;  Laterality: Left;   LAPAROSCOPIC REVISION OF [place] WITH UPPER ENDOSCOPY N/A 08/13/2019   Procedure: LAPAROSCOPIC REVISION OF ROUX EN Y WITH UPPER ENDOSCOPY; REMOVAL GASTROSTOMY TUBE, TAKEDOWN OF GASTROJEJUNOSTOMY; RESECTION OF SMALL INTESTINE ERAS PATHWAY;  Surgeon: Kinsinger, Arta Bruce, MD;  Location: [facility];  Service: General;  Laterality: N/A;   ROUX-EN-Y GASTRIC BYPASS  2007   TUBAL LIGATION  2014   Social History   Occupational History   Not on file  Tobacco Use   Smoking status: Former    Packs/day: 0.50    Years: 4.00    Pack years: 2.00    Types: Cigarettes    Quit date: 03/16/2012    Years since quitting: 8.8   Smokeless tobacco: Never  Vaping Use   Vaping Use: Never used  Substance and Sexual Activity   Alcohol use: Yes    Alcohol/week: 17.0 standard drinks    Types: 6 Glasses of wine, 11 Shots of liquor per week    Comment: occ   Drug use: No   Sexual activity: Yes    Birth control/protection: None, Surgical

## 2021-06-14 IMAGING — MR MR THORACIC SPINE W/O CM
1 of 3 series · 12 of 48 positions shown · non-contrast
Comparison: Plain films thoracic spine 07/20/2014.

CLINICAL DATA: Increasing weakness over the past 3 weeks with edema
in both lower extremities and difficulty walking. No known injury.

EXAM:
MRI THORACIC SPINE WITHOUT CONTRAST
TECHNIQUE: Multiplanar, multisequence MR imaging of the thoracic spine was
performed. No intravenous contrast was administered.

[Series 19: T2 · sagittal · 3.0mm · 0.76mm/px · 12 of 19 slices shown]
[im 1/19]
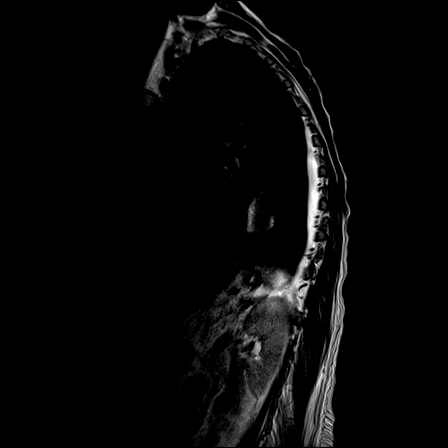
[im 2/19]
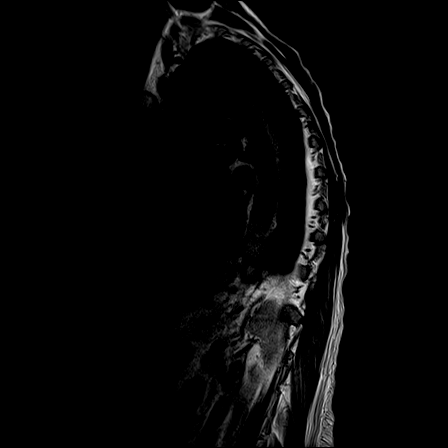
[im 4/19]
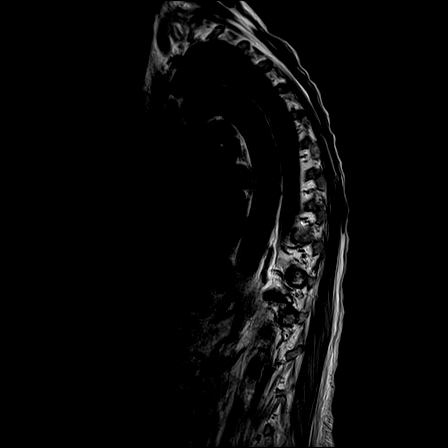
[im 5/19]
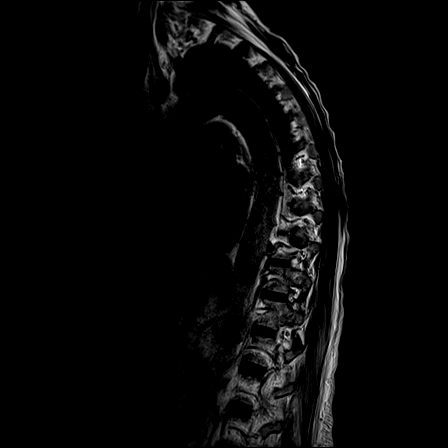
[im 7/19]
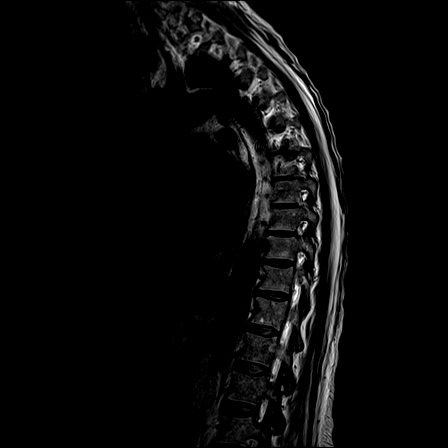
[im 9/19]
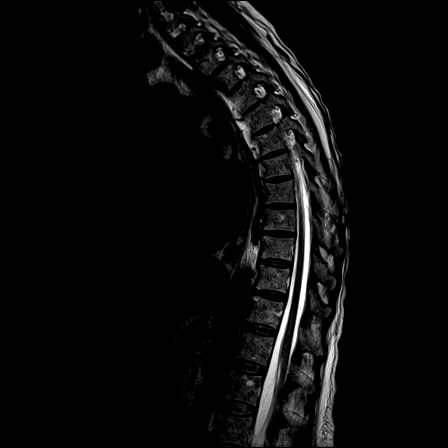
[im 10/19]
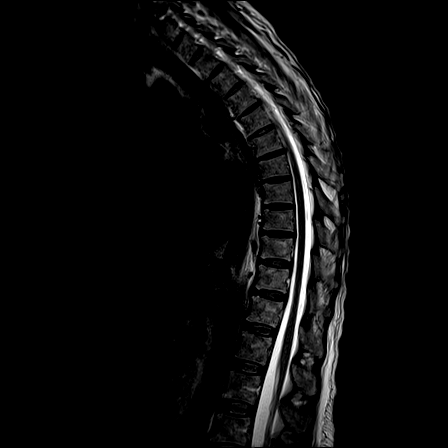
[im 12/19]
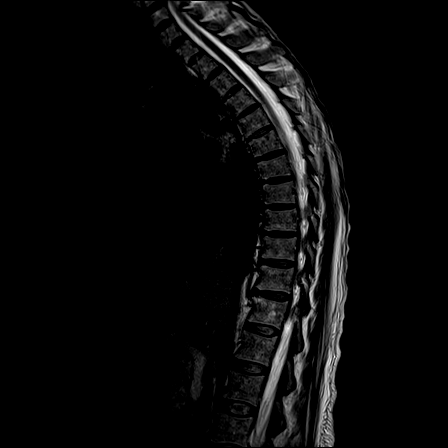
[im 14/19]
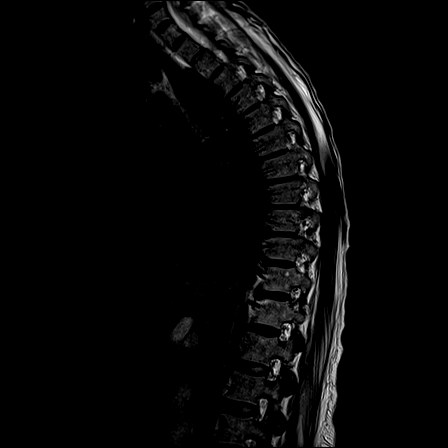
[im 15/19]
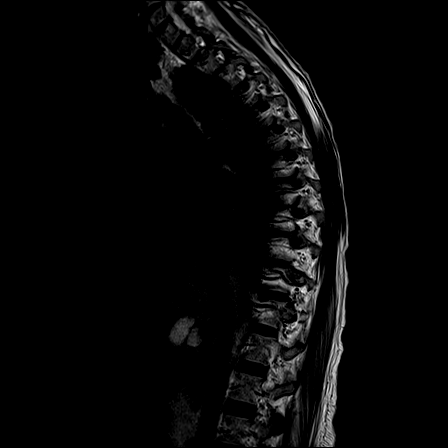
[im 17/19]
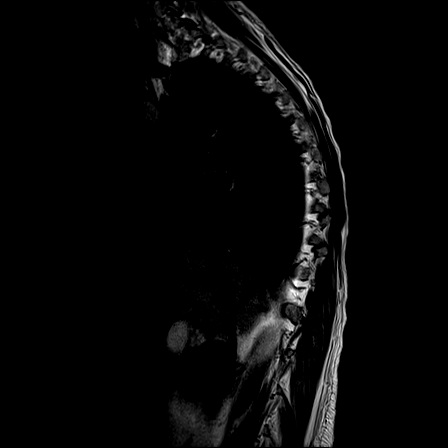
[im 19/19]
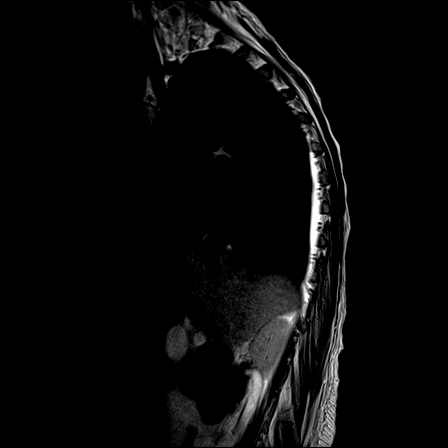

[12 of 48 positions shown; findings below may reference images not displayed]

FINDINGS: Alignment:  Normal.

Vertebrae: No fracture, evidence of discitis, or bone lesion.

Cord:  Normal signal and morphology.

Paraspinal and other soft tissues: Small bilateral pleural effusions
are seen.

Disc levels:

The central spinal canal and neural foramina are widely patent at
all levels. Minimal disc bulging at T9-10 is noted.
IMPRESSION: Normal appearing thoracic spine and spinal cord. Negative for
stenosis.

Small bilateral pleural effusions.

## 2021-10-07 ENCOUNTER — Ambulatory Visit (INDEPENDENT_AMBULATORY_CARE_PROVIDER_SITE_OTHER): Payer: Medicare HMO | Admitting: Orthopaedic Surgery

## 2021-10-07 VITALS — BP 112/70 | HR 91

## 2021-10-07 DIAGNOSIS — M7542 Impingement syndrome of left shoulder: Secondary | ICD-10-CM

## 2021-10-07 NOTE — Progress Notes (Signed)
Office Visit Note   Patient: Angie [name]           Date of Birth: 1965/11/29           MRN: [id number] Visit Date: 10/07/2021              Requested by: Harvie Junior, MD [address] [place],  [place] [address] PCP: Harvie Junior, MD   Assessment & Plan: Visit Diagnoses:  1. Impingement syndrome of left shoulder   2. Subacromial impingement of left shoulder     Plan: Injection performed if she has persistent symptoms then she may need reimaging and likely would require a surgery which would require rotator cuff repair.  Pathophysiology discussed.  Follow-Up Instructions: Return in about 2 months (around 12/07/2021).   Orders:  Orders Placed This Encounter  Procedures   Large Joint Inj: L subacromial bursa   No orders of the defined types were placed in this encounter.     Procedures: Large Joint Inj: L subacromial bursa on 10/07/2021 9:56 AM Indications: pain Details: 22 G 1.5 in needle  Arthrogram: No  Medications: 4 mL bupivacaine 0.25 %; 40 mg methylPREDNISolone acetate 40 MG/ML; 0.5 mL lidocaine 1 % Outcome: tolerated well, no immediate complications Procedure, treatment alternatives, risks and benefits explained, specific risks discussed. Consent was given by the patient. Immediately prior to procedure a time out was called to verify the correct patient, procedure, equipment, support staff and site/side marked as required. Patient was prepped and draped in the usual sterile fashion.       Clinical Data: No additional findings.   Subjective: Chief Complaint  Patient presents with   Left Shoulder - Follow-up    HPI 56 year old female returns with left shoulder recurrent impingement.  Previous injection in September worked well for many months.  She has pain with abduction sometimes has to bypass position to get her arm up overhead recently she had great difficulty lifting it up overhead.  MRI scan February 2022 showed glenohumeral  degenerative changes and partial supraspinatus tearing greater than 50% thickness.  She continues have pain anteriorly over the biceps tendon as well.  Review of Systems updated unchanged from 01/14/21 office visit.   Objective: Vital Signs: BP 112/70   Pulse 91   Physical Exam Constitutional:      Appearance: She is well-developed.  HENT:     Head: Normocephalic.     Right Ear: External ear normal.     Left Ear: External ear normal. There is no impacted cerumen.  Eyes:     Pupils: Pupils are equal, round, and reactive to light.  Neck:     Thyroid: No thyromegaly.     Trachea: No tracheal deviation.  Cardiovascular:     Rate and Rhythm: Normal rate.  Pulmonary:     Effort: Pulmonary effort is normal.  Abdominal:     Palpations: Abdomen is soft.  Musculoskeletal:     Cervical back: No rigidity.  Skin:    General: Skin is warm and dry.  Neurological:     Mental Status: She is alert and oriented to person, place, and time.  Psychiatric:        Behavior: Behavior normal.     Ortho Exam significant tenderness of the left shoulder biceps tendon positive impingement positive drop arm test.  Specialty Comments:  No specialty comments available.  Imaging: No results found.   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 05/16/2017   Symptomatic Anemia 04/25/2017   Symptomatic anemia 04/25/2017   Loss of weight    Dysphagia    Hx of laparoscopic gastrostomy tube insertion 05/04/2019 09/06/2016   At risk for adverse drug event 08/25/2016    Malnutrition following gastric bypass surgery 08/16/2016   Anastomotic ulcer 07/21/2016   Transient alteration of awareness    Alcohol use 04/27/2016   Chronic pain syndrome 04/27/2016   Iron deficiency anemia 04/27/2016   B12 deficiency 04/27/2016   Biliary anastomotic occlusion    Hypoglycemia 04/22/2016   Syncope 04/22/2016   Protein-calorie malnutrition, severe 04/22/2016   Intractable nausea and vomiting    Abdominal pain, chronic, epigastric    Hypotension 02/01/2016   Hyponatremia 02/01/2016   Subacromial impingement of left shoulder 07/06/2013   Trochanteric bursitis of left hip 05/11/2013   Arthritis 05/16/2012   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/16/2012   Past Medical History:  Diagnosis Date   Abnormal Pap smear    Alcohol abuse    Anemia    IDA   Anxiety    Takes xanax   Arthritis    left hip/knees, lower spine   Bilateral lower extremity edema    Bipolar disorder ([place])    Blood transfusion without reported diagnosis last done 04-25-17   Bulging lumbar disc    Bursitis    left shoulder   Chronic lower back pain    Cut    right middle finger with knife small cut healing pt instructed to keep clean and dry no redness or drainage   Depression    Diverticulitis    Fibromyalgia    Gastrointestinal tube present (HCC)    GERD (gastroesophageal reflux disease)    Hypertension    none since weight loss, no medications at this time   Hypoglycemia    Lactose intolerance in adult 2017   Migraines    "weekly @ least" (04/26/2016)   Seizures ([place])    one Dec. 2017 due to throat closing   Type II diabetes mellitus ([place])    "before the gastric bypass" (04/26/2016) states she no longer has diabetes    Family History  Problem Relation Age of Onset   Diabetes Father    Heart disease Father    Depression Maternal Grandmother    Heart disease Maternal Grandfather    Depression Paternal Grandmother    Colon cancer Paternal Grandfather    Pancreatic cancer  Paternal Grandfather     Past Surgical History:  Procedure Laterality Date   BALLOON DILATION N/A 05/27/2016   Procedure: BALLOON DILATION;  Surgeon: Nelida Meuse III, MD;  Location: [facility];  Service: Gastroenterology;  Laterality: N/A;   BALLOON DILATION N/A 04/21/2017   Procedure: BALLOON DILATION;  Surgeon: Doran Stabler, MD;  Location: [facility];  Service: Gastroenterology;  Laterality: N/A;   CERVICAL CONE BIOPSY     CESAREAN SECTION  1993; 1996; 1998; 2014   CESAREAN SECTION WITH BILATERAL TUBAL LIGATION Bilateral 10/28/2012   Procedure: Repeat cesarean section with delivery of baby boy at 1210. Apgars 8/9.  BILATERAL TUBAL LIGATION;  Surgeon: Florian Buff, MD;  Location: [facility];  Service: Obstetrics;  Laterality: Bilateral;   DILATION AND CURETTAGE OF UTERUS     ESOPHAGOGASTRODUODENOSCOPY N/A 05/27/2016   Procedure: ESOPHAGOGASTRODUODENOSCOPY (EGD);  Surgeon: Doran Stabler, MD;  Location: [facility];  Service: Gastroenterology;  Laterality: N/A;   ESOPHAGOGASTRODUODENOSCOPY (EGD) WITH PROPOFOL N/A 04/23/2016   Procedure: ESOPHAGOGASTRODUODENOSCOPY (EGD) WITH PROPOFOL;  Surgeon: Doran Stabler, MD;  Location: [facility];  Service: Endoscopy;  Laterality: N/A;   ESOPHAGOGASTRODUODENOSCOPY (EGD) WITH PROPOFOL N/A 01/03/2017   Procedure: ESOPHAGOGASTRODUODENOSCOPY (EGD) WITH PROPOFOL;  Surgeon: Doran Stabler, MD;  Location: [facility];  Service: Gastroenterology;  Laterality: N/A;   ESOPHAGOGASTRODUODENOSCOPY (EGD) WITH PROPOFOL N/A 04/21/2017   Procedure: ESOPHAGOGASTRODUODENOSCOPY (EGD) WITH PROPOFOL;  Surgeon: Doran Stabler, MD;  Location: [name];  Service: Gastroenterology;  Laterality: N/A;   ESOPHAGOGASTRODUODENOSCOPY (EGD) WITH PROPOFOL N/A 05/02/2019   Procedure: ESOPHAGOGASTRODUODENOSCOPY (EGD) WITH PROPOFOL;  Surgeon: Irene Shipper, MD;  Location: [facility];  Service: Endoscopy;  Laterality: N/A;   GASTROSTOMY N/A 08/16/2016   Procedure:  LAPAROSCOPIC INSERTION OF GASTROSTOMY TUBE IN REMNANT STOMACH;  Surgeon: Arta Bruce Kinsinger, MD;  Location: [facility];  Service: General;  Laterality: N/A;   GASTROSTOMY N/A 05/16/2017   Procedure: Replacement of  GASTROSTOMY TUBE;  Surgeon: [name], MD;  Location: [facility];  Service: General;  Laterality: N/A;   IR CM INJ ANY COLONIC TUBE W/FLUORO  05/15/2019   LAPAROSCOPIC INSERTION GASTROSTOMY TUBE Left 05/04/2019   Procedure: LAPAROSCOPIC INSERTION GASTROSTOMY TUBE;  Surgeon: [name], MD;  Location: [facility];  Service: General;  Laterality: Left;   LAPAROSCOPIC REVISION OF GASTROJEJUNOSTOMY Left 05/16/2017   Procedure: [place];  Surgeon: [name], MD;  Location: [facility];  Service: General;  Laterality: Left;   LAPAROSCOPIC REVISION OF [place] WITH UPPER ENDOSCOPY N/A 08/13/2019   Procedure: LAPAROSCOPIC REVISION OF ROUX EN Y WITH UPPER ENDOSCOPY; REMOVAL GASTROSTOMY TUBE, TAKEDOWN OF GASTROJEJUNOSTOMY; RESECTION OF SMALL INTESTINE ERAS PATHWAY;  Surgeon: Kinsinger, Arta Bruce, MD;  Location: [facility];  Service: General;  Laterality: N/A;   ROUX-EN-Y GASTRIC BYPASS  2007   TUBAL LIGATION  2014   Social History   Occupational History   Not on file  Tobacco Use   Smoking status: Former    Packs/day: 0.50    Years: 4.00    Total pack years: 2.00    Types: Cigarettes    Quit date: 03/16/2012    Years since quitting: 9.5   Smokeless tobacco: Never  Vaping Use   Vaping Use: Never used  Substance and Sexual Activity   Alcohol use: Yes    Alcohol/week: 17.0 standard drinks of alcohol    Types: 6 Glasses of wine, 11 Shots of liquor per week    Comment: occ   Drug use: No   Sexual activity: Yes    Birth control/protection: None, Surgical

## 2021-10-08 MED ORDER — METHYLPREDNISOLONE ACETATE 40 MG/ML IJ SUSP
40.0000 mg | INTRAMUSCULAR | Status: AC | PRN
  Administered 2021-10-07: 40 mg via INTRA_ARTICULAR

## 2021-10-08 MED ORDER — BUPIVACAINE HCL 0.25 % IJ SOLN
4.0000 mL | INTRAMUSCULAR | Status: AC | PRN
  Administered 2021-10-07: 4 mL via INTRA_ARTICULAR

## 2021-10-08 MED ORDER — LIDOCAINE HCL 1 % IJ SOLN
0.5000 mL | INTRAMUSCULAR | Status: AC | PRN
  Administered 2021-10-07: .5 mL

## 2022-01-11 ENCOUNTER — Ambulatory Visit
Admission: RE | Admit: 2022-01-11 | Discharge: 2022-01-11 | Disposition: A | Discharge status: Home / Self Care | Source: Ambulatory Visit | Attending: Specialist | Admitting: Specialist

## 2022-01-11 ENCOUNTER — Other Ambulatory Visit: Payer: Self-pay | Admitting: Specialist

## 2022-01-11 DIAGNOSIS — M25552 Pain in left hip: Secondary | ICD-10-CM

## 2022-02-03 ENCOUNTER — Ambulatory Visit (HOSPITAL_COMMUNITY)

## 2022-02-03 ENCOUNTER — Other Ambulatory Visit (HOSPITAL_COMMUNITY): Payer: Self-pay | Admitting: Specialist

## 2022-02-03 DIAGNOSIS — I739 Peripheral vascular disease, unspecified: Secondary | ICD-10-CM

## 2022-02-04 ENCOUNTER — Encounter (HOSPITAL_COMMUNITY)

## 2022-02-04 ENCOUNTER — Encounter (HOSPITAL_COMMUNITY): Payer: Self-pay

## 2022-02-04 ENCOUNTER — Ambulatory Visit (HOSPITAL_COMMUNITY)
Admission: RE | Admit: 2022-02-04 | Discharge: 2022-02-04 | Disposition: A | Discharge status: Home / Self Care | Payer: Medicare HMO | Source: Ambulatory Visit | Attending: Specialist | Admitting: Specialist

## 2022-02-04 DIAGNOSIS — I739 Peripheral vascular disease, unspecified: Secondary | ICD-10-CM

## 2022-02-04 MED ORDER — MIDAZOLAM HCL 2 MG/2ML IJ SOLN
INTRAMUSCULAR | Status: AC
  Filled 2022-02-04: qty 2

## 2022-02-04 MED ORDER — FENTANYL CITRATE (PF) 250 MCG/5ML IJ SOLN
INTRAMUSCULAR | Status: AC
Start: 2022-02-04 — End: ?
  Filled 2022-02-04: qty 5

## 2022-02-05 MED ORDER — ROCURONIUM BROMIDE 10 MG/ML (PF) SYRINGE
PREFILLED_SYRINGE | INTRAVENOUS | Status: AC
  Filled 2022-02-05: qty 10

## 2022-02-05 MED ORDER — PHENYLEPHRINE 80 MCG/ML (10ML) SYRINGE FOR IV PUSH (FOR BLOOD PRESSURE SUPPORT)
PREFILLED_SYRINGE | INTRAVENOUS | Status: AC
Start: 2022-02-05 — End: ?
  Filled 2022-02-05: qty 10

## 2022-02-05 MED ORDER — ARTIFICIAL TEARS OPHTHALMIC OINT
TOPICAL_OINTMENT | OPHTHALMIC | Status: AC
  Filled 2022-02-05: qty 3.5

## 2022-02-05 MED ORDER — ONDANSETRON HCL 4 MG/2ML IJ SOLN
INTRAMUSCULAR | Status: AC
  Filled 2022-02-05: qty 2

## 2022-02-05 MED ORDER — EPHEDRINE 5 MG/ML INJ
INTRAVENOUS | Status: AC
  Filled 2022-02-05: qty 5

## 2022-02-05 MED ORDER — LIDOCAINE 2% (20 MG/ML) 5 ML SYRINGE
INTRAMUSCULAR | Status: AC
  Filled 2022-02-05: qty 5

## 2022-02-05 MED ORDER — DEXAMETHASONE SODIUM PHOSPHATE 10 MG/ML IJ SOLN
INTRAMUSCULAR | Status: AC
  Filled 2022-02-05: qty 1

## 2022-02-05 MED ORDER — SUCCINYLCHOLINE CHLORIDE 200 MG/10ML IV SOSY
PREFILLED_SYRINGE | INTRAVENOUS | Status: AC
  Filled 2022-02-05: qty 10

## 2022-02-11 ENCOUNTER — Ambulatory Visit (HOSPITAL_COMMUNITY)
Admission: RE | Admit: 2022-02-11 | Discharge: 2022-02-11 | Disposition: A | Discharge status: Home / Self Care | Payer: Medicare HMO | Source: Ambulatory Visit | Attending: Vascular Surgery | Admitting: Vascular Surgery

## 2022-02-11 DIAGNOSIS — I739 Peripheral vascular disease, unspecified: Secondary | ICD-10-CM | POA: Diagnosis present

## 2022-03-23 ENCOUNTER — Encounter: Payer: Self-pay | Admitting: Gastroenterology

## 2022-04-28 ENCOUNTER — Encounter (HOSPITAL_COMMUNITY): Payer: Self-pay

## 2022-04-28 ENCOUNTER — Ambulatory Visit (HOSPITAL_COMMUNITY)
Admission: EM | Admit: 2022-04-28 | Discharge: 2022-04-28 | Disposition: A | Discharge status: Home / Self Care | Payer: Medicare HMO | Attending: Emergency Medicine | Admitting: Emergency Medicine

## 2022-04-28 DIAGNOSIS — Z20822 Contact with and (suspected) exposure to covid-19: Secondary | ICD-10-CM

## 2022-04-28 DIAGNOSIS — Z1152 Encounter for screening for COVID-19: Secondary | ICD-10-CM | POA: Insufficient documentation

## 2022-04-28 NOTE — ED Provider Notes (Signed)
[facility]    CSN: [id number] Arrival date & time: 04/28/22  1756      History   Chief Complaint Chief Complaint  Patient presents with   covid test    HPI Angie [name] is a 57 y.o. female.  Here for COVID test Reports husband tested positive several days ago Patient denies any symptoms at this time  Son is also sick with flulike symptoms  Past Medical History:  Diagnosis Date   Abnormal Pap smear    Alcohol abuse    Anemia    IDA   Anxiety    Takes xanax   Arthritis    left hip/knees, lower spine   Bilateral lower extremity edema    Bipolar disorder ([place])    Blood transfusion without reported diagnosis last done 04-25-17   Bulging lumbar disc    Bursitis    left shoulder   Chronic lower back pain    Cut    right middle finger with knife small cut healing pt instructed to keep clean and dry no redness or drainage   Depression    Diverticulitis    Fibromyalgia    Gastrointestinal tube present ([place])    GERD (gastroesophageal reflux disease)    Hypertension    none since weight loss, no medications at this time   Hypoglycemia    Lactose intolerance in adult 2017   Migraines    "weekly @ least" (04/26/2016)   Seizures ([place])    one Dec. 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 05/16/2017   Symptomatic Anemia 04/25/2017   Symptomatic anemia 04/25/2017   Loss of weight    Dysphagia     Hx of laparoscopic gastrostomy tube insertion 05/04/2019 09/06/2016   At risk for adverse drug event 08/25/2016   Malnutrition following gastric bypass surgery 08/16/2016   Anastomotic ulcer 07/21/2016   Transient alteration of awareness    Alcohol use 04/27/2016   Chronic pain syndrome 04/27/2016   Iron deficiency anemia 04/27/2016   B12 deficiency 04/27/2016   Biliary anastomotic occlusion    Hypoglycemia 04/22/2016   Syncope 04/22/2016   Protein-calorie malnutrition, severe 04/22/2016   Intractable nausea and vomiting    Abdominal pain, chronic, epigastric    Hypotension 02/01/2016   Hyponatremia 02/01/2016   Subacromial impingement of left shoulder 07/06/2013   Trochanteric bursitis of left hip 05/11/2013   Arthritis 05/16/2012   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 Apgars 8/9.  BILATERAL TUBAL LIGATION;  Surgeon: Florian Buff, MD;  Location: [facility];  Service: Obstetrics;  Laterality: Bilateral;   DILATION AND CURETTAGE OF UTERUS     ESOPHAGOGASTRODUODENOSCOPY N/A 05/27/2016   Procedure: ESOPHAGOGASTRODUODENOSCOPY (EGD);  Surgeon: Doran Stabler, MD;  Location: [facility];  Service: Gastroenterology;  Laterality: N/A;   ESOPHAGOGASTRODUODENOSCOPY (EGD) WITH PROPOFOL N/A 04/23/2016   Procedure: ESOPHAGOGASTRODUODENOSCOPY (EGD) WITH PROPOFOL;  Surgeon: Doran Stabler,  MD;  Location: [place];  Service: Endoscopy;  Laterality: N/A;   ESOPHAGOGASTRODUODENOSCOPY (EGD) WITH PROPOFOL N/A 01/03/2017   Procedure: ESOPHAGOGASTRODUODENOSCOPY (EGD) WITH PROPOFOL;  Surgeon: Doran Stabler, MD;  Location: [facility];  Service: Gastroenterology;  Laterality: N/A;   ESOPHAGOGASTRODUODENOSCOPY (EGD) WITH PROPOFOL N/A 04/21/2017   Procedure: ESOPHAGOGASTRODUODENOSCOPY (EGD) WITH PROPOFOL;  Surgeon: Doran Stabler, MD;  Location: [place];  Service: Gastroenterology;  Laterality: N/A;   ESOPHAGOGASTRODUODENOSCOPY (EGD) WITH PROPOFOL N/A 05/02/2019   Procedure: ESOPHAGOGASTRODUODENOSCOPY (EGD) WITH PROPOFOL;  Surgeon: Irene Shipper, MD;  Location: [facility];  Service: Endoscopy;  Laterality: N/A;   GASTROSTOMY N/A 08/16/2016   Procedure: LAPAROSCOPIC INSERTION OF GASTROSTOMY TUBE IN REMNANT STOMACH;  Surgeon: Arta Bruce Kinsinger, MD;  Location: [facility];  Service: General;  Laterality: N/A;   GASTROSTOMY N/A 05/16/2017   Procedure: Replacement of  GASTROSTOMY TUBE;  Surgeon: Kieth Brightly Arta Bruce, MD;  [facility];  Service: General;  Laterality: N/A;   IR CM INJ ANY COLONIC TUBE W/FLUORO  05/15/2019   LAPAROSCOPIC INSERTION GASTROSTOMY TUBE Left 05/04/2019   Procedure: LAPAROSCOPIC INSERTION GASTROSTOMY TUBE;  Surgeon: [name], MD;  Location: [place];  Service: General;  Laterality: Left;   LAPAROSCOPIC REVISION OF GASTROJEJUNOSTOMY Left 05/16/2017   Procedure: LAPAROSCOPIC REVISION OF GASTROJEJUNOSTOMY;  Surgeon: [name], MD;  Location: [facility];  Service: General;  Laterality: Left;   LAPAROSCOPIC REVISION OF [place] WITH UPPER ENDOSCOPY N/A 08/13/2019   Procedure: LAPAROSCOPIC REVISION OF ROUX EN Y WITH UPPER ENDOSCOPY; REMOVAL GASTROSTOMY TUBE, TAKEDOWN OF GASTROJEJUNOSTOMY; RESECTION OF SMALL INTESTINE ERAS PATHWAY;  Surgeon: Kinsinger, Arta Bruce, MD;  Location: [facility];  Service: General;  Laterality: N/A;   ROUX-EN-Y GASTRIC BYPASS   2007   TUBAL LIGATION  2014    OB History     Gravida  6   Para  4   Term  4   Preterm  0   AB  2   Living  4      SAB  2   IAB      Ectopic      Multiple      Live Births  4            Home Medications    Prior to Admission medications   Medication Sig Start Date End Date Taking? Authorizing Provider  ALPRAZolam Duanne Moron) 1 MG tablet Take 1 tablet (1 mg total) by mouth at bedtime as needed for anxiety. Patient taking differently: Take 1 mg by mouth in the morning, at noon, and at bedtime. 05/18/19   Shelly Coss, MD  blood glucose meter kit and supplies Dispense based on patient and insurance preference. Use up to four times daily as directed. (FOR ICD-10 E10.9, E11.9). 05/18/19   Shelly Coss, MD  Cyanocobalamin (VITAMIN B-12 PO) Place 2 mLs under the tongue daily.  [provider]  cyclobenzaprine (FLEXERIL) 10 MG tablet Take 1 tablet (10 mg total) by mouth 3 (three) times daily as needed for muscle spasms. 05/18/19   Shelly Coss, MD  famotidine (PEPCID) 40 MG tablet Take 40 mg by mouth daily. 03/17/20   [provider]  furosemide (LASIX) 20 MG tablet Take 20 mg by mouth daily.    [provider]  gabapentin (NEURONTIN) 100 MG capsule Take 2 capsules (200 mg total) by mouth every 12 (twelve) hours. 08/22/19   Kinsinger, Arta Bruce, MD  HYDROcodone-acetaminophen (NORCO/VICODIN) 5-325 MG tablet Take 1 tablet by mouth every 4 (four) hours as needed. 05/07/20   [provider]  lidocaine (XYLOCAINE) 5 % ointment Apply 1 application topically 3 (three) times daily as needed (pain.). Apply to Bilateral shoulders 05/11/16   [provider]  liver oil-zinc oxide (DESITIN) 40 % ointment Apply topically 2 (two) times daily. Apply around feeding tube 05/18/19   Shelly Coss, MD  magnesium gluconate (MAGONATE) 500 MG tablet Take 1 tablet (500 mg total) by mouth daily. 05/19/19   Shelly Coss, MD  Multiple  Vitamins-Minerals (MULTIVITAMIN WITH IRON-MINERALS) liquid Take 5 mLs by mouth daily. Centrum Liquid Multivitamin    [provider]  OLANZapine (ZYPREXA) 2.5 MG tablet Take 2.5 mg by mouth daily in the afternoon.  12/25/17   [provider]  ondansetron (ZOFRAN-ODT) 4 MG disintegrating tablet Take 1 tablet (4 mg total) by mouth every 6 (six) hours as needed for nausea or vomiting. 08/22/19   Kinsinger, Arta Bruce, MD  oxyCODONE (ROXICODONE) 15 MG immediate release tablet Take 1 tablet (15 mg total) by mouth in the morning, at noon, in the evening, and at bedtime. 08/22/19   Kinsinger, Arta Bruce, MD  oxymetazoline (AFRIN) 0.05 % nasal spray Place 2 sprays into both nostrils 2 (two) times daily as needed for congestion.    [provider]  pantoprazole (PROTONIX) 40 MG tablet Take 1 tablet (40 mg total) by mouth at bedtime. 08/22/19   Kinsinger, Arta Bruce, MD  protein supplement (PROSOURCE NO CARB) LIQD Take 30 mLs by mouth in the morning and at bedtime.    [provider]  QUEtiapine (SEROQUEL) 50 MG tablet Take 50 mg by mouth at bedtime. 04/06/19   [provider]  sucralfate (CARAFATE) 1 GM/10ML suspension Take 1 g by mouth 2 (two) times daily. 03/17/20   [provider]    Family History Family History  Problem Relation Age of Onset   Diabetes Father    Heart disease Father    Depression Maternal Grandmother    Heart disease Maternal Grandfather    Depression Paternal Grandmother    Colon cancer Paternal Grandfather    Pancreatic cancer Paternal Grandfather     Social History Social History   Tobacco Use   Smoking status: Former    Packs/day: 0.50    Years: 4.00    Total pack years: 2.00    Types: Cigarettes    Quit date: 03/16/2012    Years since quitting: 10.1   Smokeless tobacco: Never  Vaping Use   Vaping Use: Never used  Substance Use Topics   Alcohol use: Yes    Alcohol/week: 17.0 standard drinks of alcohol    Types:  6 Glasses of wine, 11 Shots of liquor per week    Comment: occ   Drug use: No     Allergies   Iron and Lactose intolerance (gi)   Review of Systems Review of Systems As  per HPI  Physical Exam Triage Vital Signs ED Triage Vitals  Enc Vitals Group     BP 04/28/22 1910 102/64     Pulse Rate 04/28/22 1910 60     Resp 04/28/22 1910 16     Temp 04/28/22 1910 98.3 F (36.8 C)     Temp Source 04/28/22 1910 Oral     SpO2 04/28/22 1910 96 %     Weight --      Height --      Head Circumference --      Peak Flow --      Pain Score 04/28/22 1911 0     Pain Loc --      Pain Edu? --      Excl. in [place]? --    No data found.  Updated Vital Signs BP 102/64 (BP Location: Left Arm)   Pulse 60   Temp 98.3 F (36.8 C) (Oral)   Resp 16   SpO2 96%    Physical Exam Vitals and nursing note reviewed.  HENT:     Nose: No congestion or rhinorrhea.     Mouth/Throat:     Pharynx: Oropharynx is clear.  Eyes:     Conjunctiva/sclera: Conjunctivae normal.  Cardiovascular:     Rate and Rhythm: Normal rate and regular rhythm.     Pulses: Normal pulses.  Pulmonary:     Effort: Pulmonary effort is normal.     Breath sounds: Normal breath sounds.  Musculoskeletal:     Cervical back: Normal range of motion.  Skin:    General: Skin is warm and dry.  Neurological:     Mental Status: She is alert and oriented to person, place, and time.     UC Treatments / Results  Labs (all labs ordered are listed, but only abnormal results are displayed) Labs Reviewed  SARS CORONAVIRUS 2 (TAT 6-24 HRS)    EKG  Radiology No results found.  Procedures Procedures   Medications Ordered in UC Medications - No data to display  Initial Impression / Assessment and Plan / UC Course  I have reviewed the triage vital signs and the nursing notes.  Pertinent labs & imaging results that were available during my care of the patient were reviewed by me and considered in my medical decision making (see  chart for details).  COVID test pending If positive no antivirals given no symptoms and unknown if within 5 days. Return precautions discussed. Patient agrees to plan  Final Clinical Impressions(s) / UC Diagnoses   Final diagnoses:  Encounter for screening for COVID-19     Discharge Instructions      We will call you if your covid test returns positive.  This should likely result tomorrow.      ED Prescriptions   None    PDMP not reviewed this encounter.   [place], [place] 04/28/22 1948

## 2022-04-28 NOTE — Discharge Instructions (Signed)
We will call you if your covid test returns positive.  This should likely result tomorrow.

## 2022-04-28 NOTE — ED Triage Notes (Signed)
Pt states she would like to be tested for covid states her husband was in the hospital with it, pt denies any symptoms.

## 2022-04-29 LAB — SARS CORONAVIRUS 2 (TAT 6-24 HRS): SARS Coronavirus 2: NEGATIVE

## 2022-05-03 NOTE — Progress Notes (Unsigned)
[facility] Consult Note:  History: Angie [name] 05/04/2022  Referring provider: Harvie Junior, MD  Reason for consult/chief complaint: No chief complaint on file.   Subjective  HPI:  ***  I initially saw her for inpatient consultation December 2017 for iron deficiency anemia in the setting of previous gastric bypass.  She also had marked weight loss and cachexia believed to be from alcohol abuse that was also causing architectural distortion of the pancreas and abnormalities of the biliary tree on imaging. 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  Afterward, she was referred back to see Dr. Zenia Resides at [facility] for consideration of LAMS/Axios placement. She subsequently had a surgical revision of her anastomosis by Dr. Kieth Brightly in 2019. Last seen by our practice during inpatient evaluation January 2021 (Dr. Henrene Pastor) for vomiting, weight loss and multiple electrolyte abnormalities.  Upper endoscopy report reviewed and showed widely patent anastomosis, after which Dr. Henrene Pastor suggested this patient be evaluated for reversal of this anatomy (possibly endoscopically). She subsequently had laparoscopic reversal of the gastric bypass by Dr. Cherlyn Roberts in April 2021.  ROS:  Review of Systems   Past Medical History: Past Medical History:  Diagnosis Date   Abnormal Pap smear    Alcohol abuse    Anemia    IDA   Anxiety    Takes xanax   Arthritis    left hip/knees, lower spine   Bilateral lower extremity edema    Bipolar disorder ([place])    Blood transfusion without reported diagnosis last done 04-25-17   Bulging lumbar disc    Bursitis    left shoulder   Chronic lower back pain    Cut    right middle finger with knife small cut healing pt instructed to keep clean and dry no redness or drainage   Depression    Diverticulitis    Fibromyalgia    Gastrointestinal tube present ([place])    GERD  (gastroesophageal reflux disease)    Hypertension    none since weight loss, no medications at this time   Hypoglycemia    Lactose intolerance in adult 2017   Migraines    "weekly @ least" (04/26/2016)   Seizures ([place])    one Dec. 2017 due to throat closing   Type II diabetes mellitus ([place])    "before the gastric bypass" (04/26/2016) states she no longer has diabetes     Past Surgical History: Past Surgical History:  Procedure Laterality Date   BALLOON DILATION N/A 05/27/2016   Procedure: BALLOON DILATION;  Surgeon: Doran Stabler, MD;  Location: [facility];  Service: Gastroenterology;  Laterality: N/A;   BALLOON DILATION N/A 04/21/2017   Procedure: BALLOON DILATION;  Surgeon: Doran Stabler, MD;  Location: [place];  Service: Gastroenterology;  Laterality: N/A;   CERVICAL CONE BIOPSY     CESAREAN SECTION  1993; 1996; 1998; 2014   CESAREAN SECTION WITH BILATERAL TUBAL LIGATION Bilateral 10/28/2012   Procedure: Repeat cesarean section with delivery of baby boy at 1210. Apgars 8/9.  BILATERAL TUBAL LIGATION;  Surgeon: Florian Buff, MD;  Location: [facility];  Service: Obstetrics;  Laterality: Bilateral;   DILATION AND CURETTAGE OF UTERUS     ESOPHAGOGASTRODUODENOSCOPY N/A 05/27/2016   Procedure: ESOPHAGOGASTRODUODENOSCOPY (EGD);  Surgeon: Doran Stabler, MD;  Location: [facility];  Service: Gastroenterology;  Laterality: N/A;   ESOPHAGOGASTRODUODENOSCOPY (EGD) WITH PROPOFOL N/A 04/23/2016   Procedure: ESOPHAGOGASTRODUODENOSCOPY (EGD) WITH PROPOFOL;  Surgeon: Mallie Mussel  Evern Bio, MD;  Location: [facility];  Service: Endoscopy;  Laterality: N/A;   ESOPHAGOGASTRODUODENOSCOPY (EGD) WITH PROPOFOL N/A 01/03/2017   Procedure: ESOPHAGOGASTRODUODENOSCOPY (EGD) WITH PROPOFOL;  Surgeon: Doran Stabler, MD;  [facility];  Service: Gastroenterology;  Laterality: N/A;   ESOPHAGOGASTRODUODENOSCOPY (EGD) WITH PROPOFOL N/A 04/21/2017   Procedure: ESOPHAGOGASTRODUODENOSCOPY (EGD) WITH  PROPOFOL;  Surgeon: Doran Stabler, MD;  Location: [place];  Service: Gastroenterology;  Laterality: N/A;   ESOPHAGOGASTRODUODENOSCOPY (EGD) WITH PROPOFOL N/A 05/02/2019   Procedure: ESOPHAGOGASTRODUODENOSCOPY (EGD) WITH PROPOFOL;  Surgeon: Irene Shipper, MD;  Location: [facility];  Service: Endoscopy;  Laterality: N/A;   GASTROSTOMY N/A 08/16/2016   Procedure: LAPAROSCOPIC INSERTION OF GASTROSTOMY TUBE IN REMNANT STOMACH;  Surgeon: Arta Bruce Kinsinger, MD;  [facility];  Service: General;  Laterality: N/A;   GASTROSTOMY N/A 05/16/2017   Procedure: Replacement of  GASTROSTOMY TUBE;  Surgeon: [name], MD;  [facility];  Service: General;  Laterality: N/A;   IR CM INJ ANY COLONIC TUBE W/FLUORO  05/15/2019   LAPAROSCOPIC INSERTION GASTROSTOMY TUBE Left 05/04/2019   Procedure: LAPAROSCOPIC INSERTION GASTROSTOMY TUBE;  Surgeon: [name], MD;  Location: [place];  Service: General;  Laterality: Left;   LAPAROSCOPIC REVISION OF GASTROJEJUNOSTOMY Left 05/16/2017   Procedure: [place];  Surgeon: [name], MD;  Location[facility];  Service: General;  Laterality: Left;   LAPAROSCOPIC REVISION OF [place] WITH UPPER ENDOSCOPY N/A 08/13/2019   Procedure: LAPAROSCOPIC REVISION OF ROUX EN Y WITH UPPER ENDOSCOPY; REMOVAL GASTROSTOMY TUBE, TAKEDOWN OF GASTROJEJUNOSTOMY; RESECTION OF SMALL INTESTINE ERAS PATHWAY;  Surgeon: Kinsinger, Arta Bruce, MD;  [facility];  Service: General;  Laterality: N/A;   ROUX-EN-Y GASTRIC BYPASS  2007   TUBAL LIGATION  2014     Family History: Family History  Problem Relation Age of Onset   Diabetes Father    Heart disease Father    Depression Maternal Grandmother    Heart disease Maternal Grandfather    Depression Paternal Grandmother    Colon cancer Paternal Grandfather    Pancreatic cancer Paternal Grandfather     Social History: Social History   Socioeconomic History   Marital  status: Widowed    Spouse name: Not on file   Number of children: 4   Years of education: Not on file   Highest education level: Not on file  Occupational History   Not on file  Tobacco Use   Smoking status: Former    Packs/day: 0.50    Years: 4.00    Total pack years: 2.00    Types: Cigarettes    Quit date: 03/16/2012    Years since quitting: 10.1   Smokeless tobacco: Never  Vaping Use   Vaping Use: Never used  Substance and Sexual Activity   Alcohol use: Yes    Alcohol/week: 17.0 standard drinks of alcohol    Types: 6 Glasses of wine, 11 Shots of liquor per week    Comment: occ   Drug use: No   Sexual activity: Yes    Birth control/protection: None, Surgical  Other Topics Concern   Not on file  Social History Narrative   Not on file   Social Determinants of Health   Financial Resource Strain: Not on file  Food Insecurity: Not on file  Transportation Needs: Not on file  Physical Activity: Not on file  Stress: Not on file  Social Connections: Not on file    Allergies: Allergies  Allergen Reactions   Iron Anaphylaxis    Had acute allergy with tachycardia, attention and generalized itching shortly after receiving nulecit ( iron gluconate) infusion.   Lactose Intolerance (Gi)     Outpatient Meds: Current Outpatient Medications  Medication Sig Dispense Refill   ALPRAZolam (XANAX) 1 MG tablet Take 1 tablet (1 mg total) by mouth at bedtime as needed for anxiety. (Patient taking differently: Take 1 mg by mouth in the morning, at noon, and at bedtime.) 30 tablet 0   blood glucose meter kit and supplies Dispense based on patient and insurance preference. Use up to four times daily as directed. (FOR ICD-10 E10.9, E11.9). 1 each 0   Cyanocobalamin (VITAMIN B-12 PO) Place 2 mLs under the tongue daily.     cyclobenzaprine (FLEXERIL) 10 MG tablet Take 1 tablet (10 mg total) by mouth 3 (three) times daily as needed for muscle spasms. 30 tablet 0   famotidine (PEPCID) 40 MG  tablet Take 40 mg by mouth daily.     furosemide (LASIX) 20 MG tablet Take 20 mg by mouth daily.     gabapentin (NEURONTIN) 100 MG capsule Take 2 capsules (200 mg total) by mouth every 12 (twelve) hours. 20 capsule 0   HYDROcodone-acetaminophen (NORCO/VICODIN) 5-325 MG tablet Take 1 tablet by mouth every 4 (four) hours as needed.     lidocaine (XYLOCAINE) 5 % ointment Apply 1 application topically 3 (three) times daily as needed (pain.). Apply to Bilateral shoulders     liver oil-zinc oxide (DESITIN) 40 % ointment Apply topically 2 (two) times daily. Apply around feeding tube 56.7 g 1   magnesium gluconate (MAGONATE) 500 MG tablet Take 1 tablet (500 mg total) by mouth daily. 30 tablet 1   Multiple Vitamins-Minerals (MULTIVITAMIN WITH IRON-MINERALS) liquid Take 5 mLs by mouth daily. Centrum Liquid Multivitamin     OLANZapine (ZYPREXA) 2.5 MG tablet Take 2.5 mg by mouth daily in the afternoon.   2   ondansetron (ZOFRAN-ODT) 4 MG disintegrating tablet Take 1 tablet (4 mg total) by mouth every 6 (six) hours as needed for nausea or vomiting. 20 tablet 0   oxyCODONE (ROXICODONE) 15 MG immediate release tablet Take 1 tablet (15 mg total) by mouth in the morning, at noon, in the evening, and at bedtime. 30 tablet 0   oxymetazoline (AFRIN) 0.05 % nasal spray Place 2 sprays into both nostrils 2 (two) times daily as needed for congestion.     pantoprazole (PROTONIX) 40 MG tablet Take 1 tablet (40 mg total) by mouth at bedtime. 90 tablet 3   protein supplement (PROSOURCE NO CARB) LIQD Take 30 mLs by mouth in the morning and at bedtime.     QUEtiapine (SEROQUEL) 50 MG tablet Take 50 mg by mouth at bedtime.     sucralfate (CARAFATE) 1 GM/10ML suspension Take 1 g by mouth 2 (two) times daily.     No current facility-administered medications for this visit.      ___________________________________________________________________ Objective   Exam:  There were no vitals taken for this visit. Wt Readings  from Last 3 Encounters:  01/14/21 164 lb (74.4 kg)  10/13/20 156 lb (70.8 kg)  06/10/20 140 lb (63.5 kg)    General: ***  Eyes: sclera anicteric, no redness ENT: oral mucosa moist without lesions, no cervical or supraclavicular lymphadenopathy CV: ***, no JVD, no peripheral edema Resp: clear to auscultation bilaterally, normal RR and effort noted GI: soft, *** tenderness, with active bowel sounds. No guarding or palpable organomegaly noted. Skin;  warm and dry, no rash or jaundice noted Neuro: awake, alert and oriented x 3. Normal gross motor function and fluent speech  Labs:  ***   Negative COVID swab at urgent care 04/28/22   Assessment: No diagnosis found.  ***  Plan:  ***  Thank you for the courtesy of this consult.  Please call me with any questions or concerns.  Nelida Meuse III  CC: Referring provider noted above

## 2022-05-04 ENCOUNTER — Telehealth: Payer: Self-pay | Admitting: Gastroenterology

## 2022-05-04 ENCOUNTER — Encounter: Payer: Self-pay | Admitting: Gastroenterology

## 2022-05-04 ENCOUNTER — Ambulatory Visit (INDEPENDENT_AMBULATORY_CARE_PROVIDER_SITE_OTHER): Payer: Medicare HMO | Admitting: Gastroenterology

## 2022-05-04 VITALS — BP 98/52 | HR 50 | Ht 60.0 in | Wt 159.0 lb

## 2022-05-04 DIAGNOSIS — R194 Change in bowel habit: Secondary | ICD-10-CM

## 2022-05-04 DIAGNOSIS — D649 Anemia, unspecified: Secondary | ICD-10-CM | POA: Diagnosis not present

## 2022-05-04 NOTE — Patient Instructions (Signed)
_______________________________________________________  If you are age 56 or older, your body mass index should be between 23-30. Your Body mass index is 31.05 kg/m. If this is out of the aforementioned range listed, please consider follow up with your Primary Care Provider.  If you are age 48 or younger, your body mass index should be between 19-25. Your Body mass index is 31.05 kg/m. If this is out of the aformentioned range listed, please consider follow up with your Primary Care Provider.   ________________________________________________________  The [facility] providers would like to encourage you to use [facility] to communicate with providers for non-urgent requests or questions.  Due to long hold times on the telephone, sending your provider a message by [facility] may be a faster and more efficient way to get a response.  Please allow 48 business hours for a response.  Please remember that this is for non-urgent requests.  _______________________________________________________  Please have your primary doctor fax [place] your lab result: [id number]  CBC, B12, Folate, Ferritin, IBC  It was a pleasure to see you today!  Thank you for trusting me with your gastrointestinal care!

## 2022-05-04 NOTE — Telephone Encounter (Signed)
Spoke to Angie [name] and verified the request of labs from her visit with our office today. Lab order was given  for a CBC, IBC, ferritin, folate and a B12

## 2022-05-04 NOTE — Telephone Encounter (Signed)
Received a call from [place], Dr. York Ram' office, asking about patient's visit with Dr. [name] today.  She said patient called the office and it was unclear exactly what was done for her here today.  Please call Starla at [phone] and let her know about patient's visit today.    Thank you.

## 2022-08-03 ENCOUNTER — Other Ambulatory Visit: Payer: Self-pay

## 2022-08-03 ENCOUNTER — Emergency Department (HOSPITAL_COMMUNITY)
Admission: EM | Admit: 2022-08-03 | Discharge: 2022-08-04 | Disposition: A | Discharge status: Home / Self Care | Payer: Medicare HMO | Attending: Emergency Medicine | Admitting: Emergency Medicine

## 2022-08-03 DIAGNOSIS — D649 Anemia, unspecified: Secondary | ICD-10-CM | POA: Diagnosis not present

## 2022-08-03 DIAGNOSIS — E871 Hypo-osmolality and hyponatremia: Secondary | ICD-10-CM | POA: Diagnosis not present

## 2022-08-03 DIAGNOSIS — M7989 Other specified soft tissue disorders: Secondary | ICD-10-CM | POA: Diagnosis present

## 2022-08-03 DIAGNOSIS — E876 Hypokalemia: Secondary | ICD-10-CM | POA: Diagnosis not present

## 2022-08-03 DIAGNOSIS — R7309 Other abnormal glucose: Secondary | ICD-10-CM

## 2022-08-03 DIAGNOSIS — L03115 Cellulitis of right lower limb: Secondary | ICD-10-CM | POA: Insufficient documentation

## 2022-08-03 DIAGNOSIS — E119 Type 2 diabetes mellitus without complications: Secondary | ICD-10-CM | POA: Insufficient documentation

## 2022-08-03 DIAGNOSIS — R739 Hyperglycemia, unspecified: Secondary | ICD-10-CM

## 2022-08-03 MED ORDER — KETOROLAC TROMETHAMINE 30 MG/ML IJ SOLN
30.0000 mg | Freq: Once | INTRAMUSCULAR | Status: AC
  Administered 2022-08-04: 30 mg via INTRAVENOUS
  Filled 2022-08-03: qty 1

## 2022-08-03 MED ORDER — CEFAZOLIN SODIUM-DEXTROSE 2-4 GM/100ML-% IV SOLN
2.0000 g | Freq: Once | INTRAVENOUS | Status: AC
  Administered 2022-08-04: 2 g via INTRAVENOUS
  Filled 2022-08-03: qty 100

## 2022-08-03 NOTE — ED Triage Notes (Addendum)
Pt from home c/o R ankle/foot redness and swelling x3 days, progressively worsening. States that she is concerned it is gout, but denies hx of. No meds PTA. Denies hx of DVT/blood clots. Denies injury/trauma.

## 2022-08-03 NOTE — ED Provider Notes (Signed)
 [facility] EMERGENCY DEPARTMENT AT [facility] Provider Note   CSN: [id number] Arrival date & time: 08/03/22  2231     History  Chief Complaint  Patient presents with   Leg Swelling    Angie [name] is a 57 y.o. female.  The history is provided by the patient.  She has history of diabetes, seizures, GERD, fibromyalgia and comes in because of pain, swelling, redness of her right foot for the last 3 days.  She states that she has felt hot but has not had a documented fever.  She denies chills or sweats.  She denies any trauma.  Pain is worse at night.  She has taken acetaminophen for pain without relief.   Home Medications Prior to Admission medications   Medication Sig Start Date End Date Taking? Authorizing Provider  ALPRAZolam Prudy Feeler) 1 MG tablet Take 1 tablet (1 mg total) by mouth at bedtime as needed for anxiety. Patient taking differently: Take 1 mg by mouth in the morning, at noon, and at bedtime. 05/18/19   Burnadette Pop, MD  atenolol-chlorthalidone (TENORETIC) 50-25 MG tablet Take 1 tablet by mouth daily.    [provider]  blood glucose meter kit and supplies Dispense based on patient and insurance preference. Use up to four times daily as directed. (FOR ICD-10 E10.9, E11.9). 05/18/19   Burnadette Pop, MD  Cyanocobalamin (VITAMIN B-12 PO) Place 2 mLs under the tongue daily.    [provider]  cyclobenzaprine (FLEXERIL) 10 MG tablet Take 1 tablet (10 mg total) by mouth 3 (three) times daily as needed for muscle spasms. 05/18/19   Burnadette Pop, MD  famotidine (PEPCID) 40 MG tablet Take 40 mg by mouth daily. 03/17/20   [provider]  furosemide (LASIX) 20 MG tablet Take 20 mg by mouth daily. Patient not taking: Reported on 05/04/2022    [provider]  gabapentin (NEURONTIN) 100 MG capsule Take 2 capsules (200 mg total) by mouth every 12 (twelve) hours. 08/22/19   Kinsinger, De Blanch, MD  HYDROcodone-acetaminophen  (NORCO/VICODIN) 5-325 MG tablet Take 1 tablet by mouth every 4 (four) hours as needed. 05/07/20   [provider]  lidocaine (XYLOCAINE) 5 % ointment Apply 1 application topically 3 (three) times daily as needed (pain.). Apply to Bilateral shoulders 05/11/16   [provider]  lidocaine-prilocaine (EMLA) cream Apply 1 Application topically 2 (two) times daily as needed. 04/08/22   [provider]  liver oil-zinc oxide (DESITIN) 40 % ointment Apply topically 2 (two) times daily. Apply around feeding tube 05/18/19   Burnadette Pop, MD  magnesium gluconate (MAGONATE) 500 MG tablet Take 1 tablet (500 mg total) by mouth daily. 05/19/19   Burnadette Pop, MD  Multiple Vitamins-Minerals (MULTIVITAMIN WITH IRON-MINERALS) liquid Take 5 mLs by mouth daily. Centrum Liquid Multivitamin    [provider]  nystatin (MYCOSTATIN/NYSTOP) powder Apply 1 Application topically 3 (three) times daily. 03/23/21   [provider]  OLANZapine (ZYPREXA) 2.5 MG tablet Take 2.5 mg by mouth daily in the afternoon.  Patient not taking: Reported on 05/04/2022 12/25/17   [provider]  ondansetron (ZOFRAN-ODT) 4 MG disintegrating tablet Take 1 tablet (4 mg total) by mouth every 6 (six) hours as needed for nausea or vomiting. 08/22/19   Kinsinger, De Blanch, MD  oxyCODONE (ROXICODONE) 15 MG immediate release tablet Take 1 tablet (15 mg total) by mouth in the morning, at noon, in the evening, and at bedtime. 08/22/19   Kinsinger, De Blanch, MD  oxymetazoline (AFRIN) 0.05 % nasal spray Place 2 sprays into both nostrils 2 (two) times daily as needed for congestion.    [provider]  pantoprazole (PROTONIX) 40 MG tablet Take 1 tablet (40 mg total) by mouth at bedtime. Patient not taking: Reported on 05/04/2022 08/22/19   Kinsinger, De Blanch, MD  protein supplement (PROSOURCE NO CARB) LIQD Take 30 mLs by mouth in the morning and at bedtime.    [provider]  QUEtiapine  (SEROQUEL) 50 MG tablet Take 50 mg by mouth at bedtime. 04/06/19   [provider]  sucralfate (CARAFATE) 1 GM/10ML suspension Take 1 g by mouth 2 (two) times daily. 03/17/20   [provider]      Allergies    Iron and Lactose intolerance (gi)    Review of Systems   Review of Systems  All other systems reviewed and are negative.   Physical Exam Updated Vital Signs BP 113/76   Pulse 100   Temp 98.9 F (37.2 C) (Oral)   Resp 16   Ht 5' (1.524 m)   Wt 61.7 kg   SpO2 99%   BMI 26.56 kg/m  Physical Exam Vitals and nursing note reviewed.   57 year old female, resting comfortably and in no acute distress. Vital signs are normal. Oxygen saturation is 99%, which is normal. Head is normocephalic and atraumatic. PERRLA, EOMI. Oropharynx is clear. Neck is nontender and supple without adenopathy or JVD. Back is nontender and there is no CVA tenderness. Lungs are clear without rales, wheezes, or rhonchi. Chest is nontender. Heart has regular rate and rhythm without murmur. Abdomen is soft, flat, nontender. Extremities: There is erythema, edema, marked tenderness of the distal right lower leg and foot.  This is warm to the touch.  Appearance is that of cellulitis.  Left leg is without edema, remainder of extremity exam is unremarkable.  Right calf circumference is 2 cm greater than left calf circumference, but there is no tenderness of the calf and no cord is palpable. Skin is warm and dry without rash. Neurologic: Mental status is normal, cranial nerves are intact, moves all extremities equally.        ED Results / Procedures / Treatments   Labs (all labs ordered are listed, but only abnormal results are displayed) Labs Reviewed  CBC WITH DIFFERENTIAL/PLATELET - Abnormal; Notable for the following components:      Result Value   RBC 3.46 (*)    Hemoglobin 9.6 (*)    HCT 29.6 (*)    Neutro Abs 8.4 (*)    All other components within normal limits   COMPREHENSIVE METABOLIC PANEL - Abnormal; Notable for the following components:   Sodium 131 (*)    Potassium 2.4 (*)    Glucose, Bld 105 (*)    Calcium 8.7 (*)    Total Protein 8.3 (*)    AST 12 (*)    All other components within normal limits  MAGNESIUM   Procedures Procedures  Cardiac monitor shows normal sinus rhythm, per my interpretation.  Medications Ordered in ED Medications  ceFAZolin (ANCEF) IVPB 2g/100 mL premix (0 g Intravenous Stopped 08/04/22 0046)  ketorolac (TORADOL) 30 MG/ML injection 30 mg (30 mg Intravenous Given 08/04/22 0013)  potassium chloride SA (KLOR-CON M) CR tablet 40 mEq (40 mEq Oral Given 08/04/22 0050)  potassium chloride 10 mEq in 100 mL IVPB (0 mEq Intravenous Stopped 08/04/22 0348)  morphine (PF) 4 MG/ML injection 4 mg (4 mg Intravenous Given 08/04/22 0054)  morphine (PF) 4 MG/ML injection 4 mg (4 mg Intravenous Given 08/04/22 0350)  magnesium sulfate IVPB 2 g 50 mL (0 g Intravenous Stopped 08/04/22 0515)    ED Course/ Medical Decision Making/ A&P                             Medical Decision Making Amount and/or Complexity of Data Reviewed Labs: ordered.  Risk Prescription drug management.   Erythema, pain, swelling of the right lower leg which appears to be cellulitis.  Doubt DVT, stasis dermatitis.  I have ordered screening labs of CBC, comprehensive metabolic panel and I have ordered ketorolac for pain and initial dose of cefazolin for cellulitis.  I have reviewed and interpreted her laboratory test, and my interpretation is normal WBC with normal differential, normochromic normocytic anemia which is slightly worse than last value on record from 08/19/2019, mild hyponatremia which is not felt to be clinically significant, severe hypokalemia which is likely diuretic induced.  I have ordered a magnesium level and I have ordered both oral and intravenous potassium.  Borderline elevated random glucose level is noted.  Magnesium level is in the lower  end of the normal range, and I have ordered some IV magnesium.  She did not get significant pain relief from ketorolac, and I have ordered morphine for pain which has given her adequate relief although it did need to be repeated.  I am discharging her with prescription for cephalexin as well as a small number of oxycodone tablets.  She is advised to use ibuprofen and acetaminophen as her primary pain medications.  I have encouraged prompt follow-up with her primary care provider.  Also, primary care provider will need to monitor her potassium level to see if she needs ongoing supplementation.  CRITICAL CARE Performed by: Dione Booze Total critical care time: 45 minutes Critical care time was exclusive of separately billable procedures and treating other patients. Critical care was necessary to treat or prevent imminent or life-threatening deterioration. Critical care was time spent personally by me on the following activities: development of treatment plan with patient and/or surrogate as well as nursing, discussions with consultants, evaluation of patient's response to treatment, examination of patient, obtaining history from patient or surrogate, ordering and performing treatments and interventions, ordering and review of laboratory studies, ordering and review of radiographic studies, pulse oximetry and re-evaluation of patient's condition.  Final Clinical Impression(s) / ED Diagnoses Final diagnoses:  Cellulitis of right lower leg  Diuretic-induced hypokalemia  Hyponatremia  Normochromic normocytic anemia  Elevated random blood glucose level    Rx / DC Orders ED Discharge Orders          Ordered    cephALEXin (KEFLEX) 500 MG capsule  4 times daily        08/04/22 0525    potassium chloride SA (KLOR-CON M) 20 MEQ tablet  2 times daily        08/04/22 0525    oxyCODONE 10 MG TABS  Every 6 hours PRN        08/04/22 0525              Dione Booze, MD 08/04/22 0532

## 2022-08-04 LAB — CBC WITH DIFFERENTIAL/PLATELET
Abs Immature Granulocytes: 0.05 10*3/uL (ref 0.00–0.07)
Basophils Absolute: 0.1 10*3/uL (ref 0.0–0.1)
Basophils Relative: 1 %
Eosinophils Absolute: 0.1 10*3/uL (ref 0.0–0.5)
Eosinophils Relative: 1 %
HCT: 29.6 % — ABNORMAL LOW (ref 36.0–46.0)
Hemoglobin: 9.6 g/dL — ABNORMAL LOW (ref 12.0–15.0)
Immature Granulocytes: 1 %
Lymphocytes Relative: 11 %
Lymphs Abs: 1.1 10*3/uL (ref 0.7–4.0)
MCH: 27.7 pg (ref 26.0–34.0)
MCHC: 32.4 g/dL (ref 30.0–36.0)
MCV: 85.5 fL (ref 80.0–100.0)
Monocytes Absolute: 0.8 10*3/uL (ref 0.1–1.0)
Monocytes Relative: 8 %
Neutro Abs: 8.4 10*3/uL — ABNORMAL HIGH (ref 1.7–7.7)
Neutrophils Relative %: 78 %
Platelets: 318 10*3/uL (ref 150–400)
RBC: 3.46 MIL/uL — ABNORMAL LOW (ref 3.87–5.11)
RDW: 13.9 % (ref 11.5–15.5)
WBC: 10.4 10*3/uL (ref 4.0–10.5)
nRBC: 0 % (ref 0.0–0.2)

## 2022-08-04 LAB — COMPREHENSIVE METABOLIC PANEL
ALT: 9 U/L (ref 0–44)
AST: 12 U/L — ABNORMAL LOW (ref 15–41)
Albumin: 3.9 g/dL (ref 3.5–5.0)
Alkaline Phosphatase: 70 U/L (ref 38–126)
Anion gap: 9 (ref 5–15)
BUN: 13 mg/dL (ref 6–20)
CO2: 23 mmol/L (ref 22–32)
Calcium: 8.7 mg/dL — ABNORMAL LOW (ref 8.9–10.3)
Chloride: 99 mmol/L (ref 98–111)
Creatinine, Ser: 0.96 mg/dL (ref 0.44–1.00)
GFR, Estimated: >60 mL/min (ref >60)
Glucose, Bld: 105 mg/dL — ABNORMAL HIGH (ref 70–99)
Potassium: 2.4 mmol/L — CL (ref 3.5–5.1)
Sodium: 131 mmol/L — ABNORMAL LOW (ref 135–145)
Total Bilirubin: 0.5 mg/dL (ref 0.3–1.2)
Total Protein: 8.3 g/dL — ABNORMAL HIGH (ref 6.5–8.1)

## 2022-08-04 LAB — MAGNESIUM: Magnesium: 1.7 mg/dL (ref 1.7–2.4)

## 2022-08-04 MED ORDER — OXYCODONE HCL 10 MG PO TABS: 10.0000 mg | ORAL_TABLET | Freq: Four times a day (QID) | ORAL | 0 refills | Status: DC | PRN

## 2022-08-04 MED ORDER — POTASSIUM CHLORIDE 10 MEQ/100ML IV SOLN
10.0000 meq | INTRAVENOUS | Status: AC
  Administered 2022-08-04 (×3): 10 meq via INTRAVENOUS
  Filled 2022-08-04 (×3): qty 100

## 2022-08-04 MED ORDER — MAGNESIUM SULFATE 2 GM/50ML IV SOLN
2.0000 g | Freq: Once | INTRAVENOUS | Status: AC
  Administered 2022-08-04: 2 g via INTRAVENOUS
  Filled 2022-08-04: qty 50

## 2022-08-04 MED ORDER — CEPHALEXIN 500 MG PO CAPS: 500.0000 mg | ORAL_CAPSULE | Freq: Four times a day (QID) | ORAL | 0 refills | Status: DC

## 2022-08-04 MED ORDER — POTASSIUM CHLORIDE CRYS ER 20 MEQ PO TBCR: 20.0000 meq | EXTENDED_RELEASE_TABLET | Freq: Two times a day (BID) | ORAL | 0 refills | Status: DC

## 2022-08-04 MED ORDER — MORPHINE SULFATE (PF) 4 MG/ML IV SOLN
4.0000 mg | Freq: Once | INTRAVENOUS | Status: AC
  Administered 2022-08-04: 4 mg via INTRAVENOUS
  Filled 2022-08-04: qty 1

## 2022-08-04 MED ORDER — POTASSIUM CHLORIDE CRYS ER 20 MEQ PO TBCR
40.0000 meq | EXTENDED_RELEASE_TABLET | Freq: Once | ORAL | Status: AC
Start: 2022-08-04 — End: 2022-08-04
  Administered 2022-08-04: 40 meq via ORAL
  Filled 2022-08-04: qty 2

## 2022-08-04 NOTE — Discharge Instructions (Signed)
Apply warm compresses to your leg several times a day.  You may take ibuprofen and/your acetaminophen as needed for pain.  When you combine ibuprofen and acetaminophen, you get better pain relief and you get from taking either medication by itself.  If you are still not getting adequate pain relief, and then add oxycodone.  Your potassium was very low today.  I have prescribed some potassium tablets to build up.  Your primary care provider will need to check your potassium to see if you need additional potassium supplementation once this prescription has run out.  Return if symptoms or not being adequately controlled at home.

## 2022-08-04 NOTE — ED Notes (Signed)
Pt asked for 2 cranberry juices and cup of ice

## 2022-08-11 ENCOUNTER — Encounter (HOSPITAL_COMMUNITY): Payer: Self-pay

## 2022-08-11 ENCOUNTER — Emergency Department (HOSPITAL_COMMUNITY)
Admission: EM | Admit: 2022-08-11 | Discharge: 2022-08-11 | Disposition: A | Discharge status: Home / Self Care | Payer: Medicare HMO | Attending: Emergency Medicine | Admitting: Emergency Medicine

## 2022-08-11 ENCOUNTER — Other Ambulatory Visit (HOSPITAL_COMMUNITY): Payer: Self-pay

## 2022-08-11 ENCOUNTER — Other Ambulatory Visit: Payer: Self-pay

## 2022-08-11 DIAGNOSIS — I10 Essential (primary) hypertension: Secondary | ICD-10-CM | POA: Diagnosis not present

## 2022-08-11 DIAGNOSIS — E871 Hypo-osmolality and hyponatremia: Secondary | ICD-10-CM | POA: Insufficient documentation

## 2022-08-11 DIAGNOSIS — L03115 Cellulitis of right lower limb: Secondary | ICD-10-CM | POA: Insufficient documentation

## 2022-08-11 DIAGNOSIS — E119 Type 2 diabetes mellitus without complications: Secondary | ICD-10-CM | POA: Diagnosis not present

## 2022-08-11 DIAGNOSIS — D649 Anemia, unspecified: Secondary | ICD-10-CM | POA: Insufficient documentation

## 2022-08-11 DIAGNOSIS — R2241 Localized swelling, mass and lump, right lower limb: Secondary | ICD-10-CM | POA: Diagnosis present

## 2022-08-11 DIAGNOSIS — Z79899 Other long term (current) drug therapy: Secondary | ICD-10-CM | POA: Insufficient documentation

## 2022-08-11 DIAGNOSIS — L03116 Cellulitis of left lower limb: Secondary | ICD-10-CM | POA: Diagnosis not present

## 2022-08-11 LAB — CBC WITH DIFFERENTIAL/PLATELET
Abs Immature Granulocytes: 0.02 10*3/uL (ref 0.00–0.07)
Basophils Absolute: 0 10*3/uL (ref 0.0–0.1)
Basophils Relative: 1 %
Eosinophils Absolute: 0.2 10*3/uL (ref 0.0–0.5)
Eosinophils Relative: 3 %
HCT: 30.2 % — ABNORMAL LOW (ref 36.0–46.0)
Hemoglobin: 9.2 g/dL — ABNORMAL LOW (ref 12.0–15.0)
Immature Granulocytes: 0 %
Lymphocytes Relative: 28 %
Lymphs Abs: 1.5 10*3/uL (ref 0.7–4.0)
MCH: 27.1 pg (ref 26.0–34.0)
MCHC: 30.5 g/dL (ref 30.0–36.0)
MCV: 89.1 fL (ref 80.0–100.0)
Monocytes Absolute: 0.4 10*3/uL (ref 0.1–1.0)
Monocytes Relative: 7 %
Neutro Abs: 3.2 10*3/uL (ref 1.7–7.7)
Neutrophils Relative %: 61 %
Platelets: 478 10*3/uL — ABNORMAL HIGH (ref 150–400)
RBC: 3.39 MIL/uL — ABNORMAL LOW (ref 3.87–5.11)
RDW: 14.6 % (ref 11.5–15.5)
WBC: 5.2 10*3/uL (ref 4.0–10.5)
nRBC: 0 % (ref 0.0–0.2)

## 2022-08-11 LAB — BASIC METABOLIC PANEL
Anion gap: 9 (ref 5–15)
BUN: 11 mg/dL (ref 6–20)
CO2: 19 mmol/L — ABNORMAL LOW (ref 22–32)
Calcium: 8.9 mg/dL (ref 8.9–10.3)
Chloride: 103 mmol/L (ref 98–111)
Creatinine, Ser: 0.92 mg/dL (ref 0.44–1.00)
GFR, Estimated: >60 mL/min (ref >60)
Glucose, Bld: 90 mg/dL (ref 70–99)
Potassium: 4.4 mmol/L (ref 3.5–5.1)
Sodium: 131 mmol/L — ABNORMAL LOW (ref 135–145)

## 2022-08-11 LAB — LACTIC ACID, PLASMA
Lactic Acid, Venous: 1 mmol/L (ref 0.5–1.9)
Lactic Acid, Venous: 0.7 mmol/L (ref 0.5–1.9)

## 2022-08-11 MED ORDER — KETOROLAC TROMETHAMINE 15 MG/ML IJ SOLN
15.0000 mg | Freq: Once | INTRAMUSCULAR | Status: DC
  Filled 2022-08-11: qty 1

## 2022-08-11 MED ORDER — IBUPROFEN 200 MG PO TABS: 600.0000 mg | ORAL_TABLET | Freq: Once | ORAL | Status: DC

## 2022-08-11 MED ORDER — NAPROXEN 500 MG PO TABS: 500.0000 mg | ORAL_TABLET | Freq: Two times a day (BID) | ORAL | 0 refills | Status: DC

## 2022-08-11 MED ORDER — DEXTROSE 5 % IV SOLN
1500.0000 mg | Freq: Once | INTRAVENOUS | Status: AC
  Administered 2022-08-11: 1500 mg via INTRAVENOUS
  Filled 2022-08-11: qty 75

## 2022-08-11 MED ORDER — OXYCODONE-ACETAMINOPHEN 5-325 MG PO TABS
2.0000 | ORAL_TABLET | Freq: Once | ORAL | Status: AC
  Administered 2022-08-11: 2 via ORAL
  Filled 2022-08-11: qty 2

## 2022-08-11 MED ORDER — KETOROLAC TROMETHAMINE 60 MG/2ML IM SOLN
30.0000 mg | Freq: Once | INTRAMUSCULAR | Status: AC
  Administered 2022-08-11: 30 mg via INTRAMUSCULAR
  Filled 2022-08-11: qty 2

## 2022-08-11 NOTE — ED Provider Notes (Signed)
[place] EMERGENCY DEPARTMENT AT [facility] Provider Note  CSN: [id number] Arrival date & time: 08/11/22 0154  Chief Complaint(s) Leg Pain  HPI Angie [name] is a 57 y.o. female with a past medical history listed below recently seen on April 9 for right leg cellulitis and prescribed Keflex returns to the emergency department for continued right leg pain related to cellulitis and development of redness to the left leg.  Right leg redness has improved but not significantly.  Patient reports that she has tried being compliant with the Keflex 4 times daily but has missed some doses due to family demands.  Patient admits to illicit drug use stating that she snorts fentanyl.  She reports that she is in the process of getting help.  She denies any IV drug use.  Denies any trauma.   Leg Pain   Past Medical History Past Medical History:  Diagnosis Date   Abnormal Pap smear    Alcohol abuse    Anemia    IDA   Anxiety    Takes xanax   Arthritis    left hip/knees, lower spine   Bilateral lower extremity edema    Bipolar disorder    Blood transfusion without reported diagnosis last done 04-25-17   Bulging lumbar disc    Bursitis    left shoulder   Chronic lower back pain    Cut    right middle finger with knife small cut healing pt instructed to keep clean and dry no redness or drainage   Depression    Diverticulitis    Fibromyalgia    Gastrointestinal tube present    GERD (gastroesophageal reflux disease)    Hypertension    none since weight loss, no medications at this time   Hypoglycemia    Lactose intolerance in adult 2017   Migraines    "weekly @ least" (04/26/2016)   Seizures    one Dec. 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   Symptomatic Anemia 04/25/2017   Symptomatic anemia 04/25/2017   Loss of weight    Dysphagia    Hx of laparoscopic gastrostomy tube insertion 05/04/2019 09/06/2016   At risk for adverse drug event 08/25/2016   Malnutrition following gastric bypass surgery 08/16/2016   Anastomotic ulcer 07/21/2016   Transient alteration of awareness    Alcohol use 04/27/2016   Chronic pain syndrome 04/27/2016   Iron deficiency anemia 04/27/2016   B12 deficiency 04/27/2016   Biliary anastomotic occlusion    Hypoglycemia 04/22/2016   Syncope 04/22/2016   Protein-calorie malnutrition, severe 04/22/2016   Intractable nausea and vomiting    Abdominal pain, chronic, epigastric    Hypotension 02/01/2016   Hyponatremia 02/01/2016   Subacromial impingement of left shoulder 07/06/2013   Trochanteric bursitis of left hip 05/11/2013   Arthritis 05/16/2012   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 Authorizing Provider  ALPRAZolam Prudy Feeler) 1 MG tablet Take 1 tablet (1 mg total) by mouth at bedtime as needed for anxiety. Patient taking differently: Take 1 mg by mouth 3 (three) times daily as needed for anxiety. 05/18/19  Yes Burnadette Pop, MD  dicyclomine (BENTYL) 20 MG tablet Take 20 mg by mouth 2 (two) times daily. 05/10/22  Yes [provider]  naproxen (NAPROSYN) 500 MG tablet Take 1 tablet (500 mg total) by mouth 2 (two) times daily. 08/11/22  Yes Dorothyann Mourer, Amadeo Garnet, MD  omeprazole (PRILOSEC) 20 MG capsule Take by mouth. 07/03/20  Yes [provider]   atenolol-chlorthalidone (TENORETIC) 50-25 MG tablet Take 1 tablet by mouth daily.    [provider]  blood glucose meter kit and supplies Dispense based on patient and insurance preference. Use up to four times daily as directed. (FOR ICD-10 E10.9, E11.9). 05/18/19   Burnadette Pop, MD  cephALEXin (KEFLEX) 500 MG capsule Take 1 capsule (500 mg total) by mouth 4 (four) times daily. 08/04/22   Dione Booze, MD  Cyanocobalamin (VITAMIN B-12 PO) Place 2 mLs under the tongue daily.    [provider]  cyclobenzaprine (FLEXERIL) 10 MG tablet Take 1 tablet (10 mg total) by mouth 3 (three) times daily as needed for muscle spasms. 05/18/19   Burnadette Pop, MD  famotidine (PEPCID) 40 MG tablet Take 40 mg by mouth daily. 03/17/20   [provider]  furosemide (LASIX) 20 MG tablet Take 20 mg by mouth daily. Patient not taking: Reported on 05/04/2022    [provider]  gabapentin (NEURONTIN) 100 MG capsule Take 2 capsules (200 mg total) by mouth every 12 (twelve) hours. 08/22/19   Kinsinger, De Blanch, MD  lidocaine (XYLOCAINE) 5 % ointment Apply 1 application topically 3 (three) times daily as needed (pain.). Apply to Bilateral shoulders 05/11/16   [provider]  lidocaine-prilocaine (EMLA) cream Apply 1 Application topically 2 (two) times daily as needed. 04/08/22   [provider]  liver oil-zinc oxide (DESITIN) 40 % ointment Apply topically 2 (two) times daily. Apply around feeding tube 05/18/19   Burnadette Pop, MD  magnesium gluconate (MAGONATE) 500 MG tablet Take 1 tablet (500 mg total) by mouth daily. 05/19/19   Burnadette Pop, MD  Multiple Vitamins-Minerals (MULTIVITAMIN WITH IRON-MINERALS) liquid Take 5 mLs by mouth daily. Centrum Liquid Multivitamin    [provider]  nystatin (MYCOSTATIN/NYSTOP) powder Apply 1 Application topically 3 (three) times daily. 03/23/21   [provider]  OLANZapine (ZYPREXA) 2.5 MG tablet Take 2.5 mg  by mouth daily in the afternoon.  Patient not taking: Reported on 05/04/2022 12/25/17   [provider]  ondansetron (ZOFRAN-ODT) 4 MG disintegrating tablet Take 1 tablet (4 mg total) by mouth every 6 (six) hours as needed for nausea or vomiting. 08/22/19   Kinsinger, De Blanch, MD  oxyCODONE 10 MG TABS Take 1 tablet (10 mg total) by mouth every 6 (six) hours as needed for severe pain. 08/04/22   Dione Booze, MD  oxymetazoline (AFRIN) 0.05 % nasal spray Place 2 sprays into both nostrils 2 (two) times daily as needed for congestion.    [provider]  pantoprazole (PROTONIX) 40 MG tablet Take 1 tablet (40 mg total) by mouth at bedtime. Patient not taking: Reported on 05/04/2022 08/22/19   Kinsinger, De Blanch, MD  potassium chloride SA (KLOR-CON M) 20 MEQ tablet Take 1 tablet (20 mEq total) by mouth 2 (two) times daily.  08/04/22   Dione Booze, MD  protein supplement (PROSOURCE NO CARB) LIQD Take 30 mLs by mouth in the morning and at bedtime.    [provider]  QUEtiapine (SEROQUEL) 50 MG tablet Take 50 mg by mouth at bedtime. 04/06/19   [provider]  sucralfate (CARAFATE) 1 GM/10ML suspension Take 1 g by mouth 2 (two) times daily. 03/17/20   [provider]                                                                                                                                    Allergies Iron, Lactose, and Lactose intolerance (gi)  Review of Systems Review of Systems As noted in HPI  Physical Exam Vital Signs  I have reviewed the triage vital signs BP 122/78   Pulse 88   Temp 97.9 F (36.6 C) (Oral)   Resp 18   Ht 5' (1.524 m)   Wt 62.6 kg   SpO2 100%   BMI 26.95 kg/m   Physical Exam Vitals reviewed.  Constitutional:      General: She is not in acute distress.    Appearance: She is well-developed. She is not diaphoretic.  HENT:     Head: Normocephalic and atraumatic.     Right Ear: External ear normal.     Left Ear: External  ear normal.     Nose: Nose normal.  Eyes:     General: No scleral icterus.    Conjunctiva/sclera: Conjunctivae normal.  Neck:     Trachea: Phonation normal.  Cardiovascular:     Rate and Rhythm: Normal rate and regular rhythm.  Pulmonary:     Effort: Pulmonary effort is normal. No respiratory distress.     Breath sounds: No stridor.  Abdominal:     General: There is no distension.  Musculoskeletal:        General: Normal range of motion.     Cervical back: Normal range of motion.  Skin:    Findings: Erythema present.     Comments: See image  Neurological:     Mental Status: She is alert and oriented to person, place, and time.  Psychiatric:        Behavior: Behavior normal.     ED Results and Treatments Labs (all labs ordered are listed, but only abnormal results are displayed) Labs Reviewed  CBC WITH DIFFERENTIAL/PLATELET - Abnormal; Notable for the following components:      Result Value   RBC 3.39 (*)    Hemoglobin 9.2 (*)    HCT 30.2 (*)    Platelets 478 (*)    All other components within normal limits  BASIC METABOLIC PANEL - Abnormal; Notable for the following components:   Sodium 131 (*)    CO2 19 (*)    All other components within normal limits  LACTIC ACID, PLASMA  LACTIC ACID, PLASMA  EKG  EKG Interpretation  Date/Time:    Ventricular Rate:    PR Interval:    QRS Duration:   QT Interval:    QTC Calculation:   R Axis:     Text Interpretation:         Radiology No results found.  Medications Ordered in ED Medications  ketorolac (TORADOL) injection 30 mg (30 mg Intramuscular Given 08/11/22 0405)  oxyCODONE-acetaminophen (PERCOCET/ROXICET) 5-325 MG per tablet 2 tablet (2 tablets Oral Given 08/11/22 0642)  dalbavancin (DALVANCE) 1,500 mg in dextrose 5 % 500 mL IVPB (0 mg Intravenous Stopped 08/11/22 1015)                                                                                                                                      Procedures Procedures  (including critical care time)  Medical Decision Making / ED Course  Click here for ABCD2, HEART and other calculators  Medical Decision Making Amount and/or Complexity of Data Reviewed Labs: ordered. Decision-making details documented in ED Course.  Risk Prescription drug management.    Patient presents for persistent right leg pain and erythema and new development of erythema in the left leg.  Admits to not being fully compliant with antibiotics and illicit drug use.  Exam is consistent with persistent cellulitis of the right lower extremity and development of cellulitis in the left posterior lower extremity seen on photo above.  No signs of trauma noted.  Low suspicion for DVT.  Pulses intact and not below suspicion for arterial occlusion.  My suspicion for necrotizing soft tissue infection is low at this time.  CBC without leukocytosis.  Anemia with stable hemoglobin noted. BMP with mild hyponatremia.  No other significant electrolyte derangements or renal sufficiency. Lactic acid normal.  Patient provided with IM Toradol and oral Percocet. Given the persistent nature of her right lower extremity cellulitis and new development of left lower extremity cellulitis, I feel patient would benefit from IV antibiotics.  To prevent admission to the hospital, IV Dalvance was given.  Patient will be discharged after completion.      Final Clinical Impression(s) / ED Diagnoses Final diagnoses:  Cellulitis of right lower extremity  Cellulitis of left lower extremity   The patient appears reasonably screened and/or stabilized for discharge and I doubt any other medical condition or other [facility] requiring further screening, evaluation, or treatment in the ED at this time. I have discussed the findings, Dx and Tx plan with the patient/family who expressed  understanding and agree(s) with the plan. Discharge instructions discussed at length. The patient/family was given strict return precautions who verbalized understanding of the instructions. No further questions at time of discharge.  Disposition: Discharge  Condition: Good  ED Discharge Orders          Ordered    Ambulatory referral to Infectious Disease       Comments: Cellulitis patient:  Received dalbavancin on  08/11/2022.   08/11/22 0703    naproxen (NAPROSYN) 500 MG tablet  2 times daily        08/11/22 0750            Follow Up: Jearld Lesch, MD [address] [place] [address] [phone]  Call  to schedule an appointment for close follow up           This chart was dictated using voice recognition software.  Despite best efforts to proofread,  errors can occur which can change the documentation meaning.    Nira Conn, MD 08/11/22 [phone]

## 2022-08-11 NOTE — ED Notes (Signed)
Pt requesting pain medication for Infiltration site and for legs. MD aware

## 2022-08-11 NOTE — ED Notes (Addendum)
Assisted pt to the restroom with wheelchair.  

## 2022-08-11 NOTE — ED Notes (Signed)
Patient calling out went in room and noted IV to LUA infiltrated. IV removed.Ice pack applied.

## 2022-08-11 NOTE — Discharge Instructions (Addendum)
The antibiotic that we gave you through the IV will last 10 days in your system.  There is no need to continue taking the oral antibiotic that you were previously prescribed.

## 2022-08-11 NOTE — ED Triage Notes (Signed)
Left leg pain x 3 days.   Recently seen for cellulitis and swelling in right leg but says she thinks it is spreading.

## 2022-08-11 NOTE — ED Notes (Signed)
US PIV established. 

## 2022-08-11 NOTE — Progress Notes (Signed)
Pharmacy Note: dalbavancin (DALVANCE) for Acute Bacterial Skin and Skin Structure Infection (ABSSSI) Patients to [facility] Discharge   Angie [name] is a 57 y.o. female who presented to [facility] on 08/11/2022 with an ABSSSI.  Inclusion criteria:  Indication: Worsening LLE Cellulitis. Seen in ED 4/9 and received Cefazolin IV and discharged with prescription for cephalexin.  She has been noncompliant with oral antibiotics at home.    Patient has at least one SIRS criteria present: none Plan for dalbavancin to avoid hospital admission for IV antibiotics per Dr Eudelia Bunch.  Exclusion criteria: Patient was evaluated and negative for hardware involvement, hypotension / shock, elevated lactate > 2 without other explanation, gram-negative infection risk factors (bites, water exposure, infection after trauma, infection after skin graft, burns, severe immunocompromise), necrotizing fasciitis possible / confirmed, known or suspected osteomyelitis or septic arthritis, endocarditis, diabetic foot infection, ischemic ulcers, post-operative wound infection, perirectal infection, need for drainage in the OR, hand / facial infections, injection drug users with  fever, bacteremia, pregnancy or breastfeeding, allergy related to antibiotics like vancomycin, known liver disease ( T.Bili > 2x ULN or AST/ALT > 3x ULN).  Junita Push PharmD 08/11/2022, 6:55 AM Clinical Pharmacist

## 2022-08-24 ENCOUNTER — Other Ambulatory Visit (HOSPITAL_COMMUNITY): Payer: Self-pay

## 2022-08-25 ENCOUNTER — Ambulatory Visit: Admitting: Internal Medicine

## 2022-08-25 NOTE — Progress Notes (Deleted)
Patient: Angie [name]  DOB: March 01, 1966 MRN: [id number] PCP: Jearld Lesch, MD  Referring Provider: ED  No chief complaint on file.    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 05/16/2017   Symptomatic Anemia 04/25/2017   Symptomatic anemia 04/25/2017   Loss of weight    Dysphagia    Hx of laparoscopic gastrostomy tube insertion 05/04/2019 09/06/2016   At risk for adverse drug event 08/25/2016   Malnutrition following gastric bypass surgery 08/16/2016   Anastomotic ulcer 07/21/2016   Transient alteration of awareness    Alcohol use 04/27/2016   Chronic pain syndrome 04/27/2016   Iron deficiency anemia 04/27/2016   B12 deficiency 04/27/2016   Biliary anastomotic occlusion    Hypoglycemia 04/22/2016   Syncope 04/22/2016   Protein-calorie malnutrition, severe 04/22/2016   Intractable nausea and vomiting    Abdominal pain, chronic, epigastric    Hypotension 02/01/2016   Hyponatremia 02/01/2016   Subacromial impingement of left shoulder 07/06/2013   Trochanteric bursitis of left hip 05/11/2013   Arthritis 05/16/2012   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  She was prescribed Keflex in the ED on April 9.  She continued to have  right leg pain and development of redness on the left leg.  Noted that she is missed some doses of 4 times daily Keflex.  Admits snorting fentanyl well, denied IVDA.  On 417 in the ED, no leukocytosis no fevers.  No erythema noted around the legs but some ruddy appearance on on my review of photo and note.  She was given a dose of Dalvance and ID referral.  ROS  Past Medical History:  Diagnosis Date   Abnormal Pap smear    Alcohol abuse    Anemia    IDA   Anxiety    Takes xanax   Arthritis    left hip/knees, lower spine   Bilateral lower extremity edema    Bipolar disorder (HCC)    Blood transfusion without reported diagnosis last done 04-25-17   Bulging lumbar disc    Bursitis    left shoulder   Chronic lower back pain    Cut    right middle finger with knife small cut healing pt instructed to keep clean and dry no redness or drainage   Depression    Diverticulitis    Fibromyalgia    Gastrointestinal tube present (HCC)    GERD (gastroesophageal reflux disease)    Hypertension    none since weight loss, no medications at this time   Hypoglycemia    Lactose intolerance in adult 2017   Migraines    "weekly @ least" (04/26/2016)   Seizures (HCC)    one Dec. 2017 due to throat closing   Type  II diabetes mellitus (HCC)    "before the gastric bypass" (04/26/2016) states she no longer has diabetes    Outpatient Medications Prior to Visit  Medication Sig Dispense Refill   ALPRAZolam (XANAX) 1 MG tablet Take 1 tablet (1 mg total) by mouth at bedtime as needed for anxiety. (Patient taking differently: Take 1 mg by mouth 3 (three) times daily as needed for anxiety.) 30 tablet 0   atenolol-chlorthalidone (TENORETIC) 50-25 MG tablet Take 1 tablet by mouth daily.     blood glucose meter kit and supplies Dispense based on patient and insurance preference. Use up to four times daily as directed. (FOR ICD-10 E10.9, E11.9). 1 each 0   cephALEXin (KEFLEX) 500 MG capsule Take 1 capsule (500 mg  total) by mouth 4 (four) times daily. 40 capsule 0   Cyanocobalamin (VITAMIN B-12 PO) Place 2 mLs under the tongue daily.     cyclobenzaprine (FLEXERIL) 10 MG tablet Take 1 tablet (10 mg total) by mouth 3 (three) times daily as needed for muscle spasms. 30 tablet 0   dicyclomine (BENTYL) 20 MG tablet Take 20 mg by mouth 2 (two) times daily.     famotidine (PEPCID) 40 MG tablet Take 40 mg by mouth daily.     furosemide (LASIX) 20 MG tablet Take 20 mg by mouth daily. (Patient not taking: Reported on 05/04/2022)     gabapentin (NEURONTIN) 100 MG capsule Take 2 capsules (200 mg total) by mouth every 12 (twelve) hours. 20 capsule 0   lidocaine (XYLOCAINE) 5 % ointment Apply 1 application topically 3 (three) times daily as needed (pain.). Apply to Bilateral shoulders     lidocaine-prilocaine (EMLA) cream Apply 1 Application topically 2 (two) times daily as needed.     liver oil-zinc oxide (DESITIN) 40 % ointment Apply topically 2 (two) times daily. Apply around feeding tube 56.7 g 1   magnesium gluconate (MAGONATE) 500 MG tablet Take 1 tablet (500 mg total) by mouth daily. 30 tablet 1   Multiple Vitamins-Minerals (MULTIVITAMIN WITH IRON-MINERALS) liquid Take 5 mLs by mouth daily. Centrum Liquid Multivitamin     naproxen (NAPROSYN) 500 MG tablet Take 1 tablet (500 mg total) by mouth 2 (two) times daily. 30 tablet 0   nystatin (MYCOSTATIN/NYSTOP) powder Apply 1 Application topically 3 (three) times daily.     OLANZapine (ZYPREXA) 2.5 MG tablet Take 2.5 mg by mouth daily in the afternoon.  (Patient not taking: Reported on 05/04/2022)  2   omeprazole (PRILOSEC) 20 MG capsule Take by mouth.     ondansetron (ZOFRAN-ODT) 4 MG disintegrating tablet Take 1 tablet (4 mg total) by mouth every 6 (six) hours as needed for nausea or vomiting. 20 tablet 0   oxyCODONE 10 MG TABS Take 1 tablet (10 mg total) by mouth every 6 (six) hours as needed for severe pain. 15 tablet 0   oxymetazoline (AFRIN) 0.05 % nasal spray Place 2  sprays into both nostrils 2 (two) times daily as needed for congestion.     pantoprazole (PROTONIX) 40 MG tablet Take 1 tablet (40 mg total) by mouth at bedtime. (Patient not taking: Reported on 05/04/2022) 90 tablet 3   potassium chloride SA (KLOR-CON M) 20 MEQ tablet Take 1 tablet (20 mEq total) by mouth 2 (two) times daily. 30 tablet 0   protein supplement (PROSOURCE NO CARB) LIQD Take 30 mLs by mouth in the morning and at bedtime.     QUEtiapine (SEROQUEL) 50 MG tablet Take 50 mg by mouth at bedtime.  sucralfate (CARAFATE) 1 GM/10ML suspension Take 1 g by mouth 2 (two) times daily.     No facility-administered medications prior to visit.     Allergies  Allergen Reactions   Iron Anaphylaxis and Itching    Had acute allergy with tachycardia, attention and generalized itching shortly after receiving nulecit ( iron gluconate) infusion.  Had acute allergy with tachycardia, attention and generalized itching shortly after receiving nulecit ( iron gluconate) infusion.  Had acute allergy with tachycardia, attention and generalized itching shortly after receiving nulecit ( iron gluconate) infusion.  Had acute allergy with tachycardia, attention and generalized itching shortly after receiving nulecit ( iron gluconate) infusion., Had acute allergy with tachycardia, attention and generalized itching shortly after receiving nulecit ( iron gluconate) infusion.   Lactose Other (See Comments)   Lactose Intolerance (Gi) Other (See Comments)    Social History   Tobacco Use   Smoking status: Former    Packs/day: 0.50    Years: 4.00    Additional pack years: 0.00    Total pack years: 2.00    Types: Cigarettes    Quit date: 03/16/2012    Years since quitting: 10.4   Smokeless tobacco: Never  Vaping Use   Vaping Use: Never used  Substance Use Topics   Alcohol use: Yes    Alcohol/week: 17.0 standard drinks of alcohol    Types: 6 Glasses of wine, 11 Shots of liquor per week    Comment: occ    Drug use: No    Family History  Problem Relation Age of Onset   Diabetes Father    Heart disease Father    Depression Maternal Grandmother    Heart disease Maternal Grandfather    Depression Paternal Grandmother    Colon cancer Paternal Grandfather    Pancreatic cancer Paternal Grandfather     Objective:  There were no vitals filed for this visit. There is no height or weight on file to calculate BMI.  Physical Exam  Lab Results: Lab Results  Component Value Date   WBC 5.2 08/11/2022   HGB 9.2 (L) 08/11/2022   HCT 30.2 (L) 08/11/2022   MCV 89.1 08/11/2022   PLT 478 (H) 08/11/2022    Lab Results  Component Value Date   CREATININE 0.92 08/11/2022   BUN 11 08/11/2022   NA 131 (L) 08/11/2022   K 4.4 08/11/2022   CL 103 08/11/2022   CO2 19 (L) 08/11/2022    Lab Results  Component Value Date   ALT 9 08/03/2022   AST 12 (L) 08/03/2022   ALKPHOS 70 08/03/2022   BILITOT 0.5 08/03/2022     Assessment & Plan:   #Bilateral lower extremity cellulitis - Seen in the ED on 4/9 for right leg cellulitis prescribed 4 times daily Keflex. - Return to the ED on 4/17, intermittent adherence to Keflex.  Noted redness on left leg as well.  Per photo there seem to be some ruddy appearance. - She was given dose of Dalvance in the ED on 4/17 and ID referral. Danelle Earthly, MD [facility]   08/25/22  9:36 AM

## 2022-09-30 ENCOUNTER — Encounter (HOSPITAL_COMMUNITY): Payer: Self-pay

## 2022-09-30 ENCOUNTER — Other Ambulatory Visit: Payer: Self-pay

## 2022-09-30 ENCOUNTER — Emergency Department (HOSPITAL_COMMUNITY): Payer: Medicare HMO

## 2022-09-30 ENCOUNTER — Inpatient Hospital Stay (HOSPITAL_COMMUNITY)
Admission: EM | Admit: 2022-09-30 | Discharge: 2022-10-03 | DRG: 603 | Disposition: A | Discharge status: Home / Self Care | Payer: Medicare HMO | Attending: Internal Medicine | Admitting: Internal Medicine

## 2022-09-30 DIAGNOSIS — L03116 Cellulitis of left lower limb: Principal | ICD-10-CM

## 2022-09-30 DIAGNOSIS — Z87891 Personal history of nicotine dependence: Secondary | ICD-10-CM

## 2022-09-30 DIAGNOSIS — E876 Hypokalemia: Secondary | ICD-10-CM | POA: Diagnosis present

## 2022-09-30 DIAGNOSIS — D649 Anemia, unspecified: Secondary | ICD-10-CM | POA: Diagnosis present

## 2022-09-30 DIAGNOSIS — E119 Type 2 diabetes mellitus without complications: Secondary | ICD-10-CM | POA: Diagnosis present

## 2022-09-30 DIAGNOSIS — Z79899 Other long term (current) drug therapy: Secondary | ICD-10-CM

## 2022-09-30 DIAGNOSIS — M797 Fibromyalgia: Secondary | ICD-10-CM | POA: Diagnosis present

## 2022-09-30 DIAGNOSIS — Z818 Family history of other mental and behavioral disorders: Secondary | ICD-10-CM

## 2022-09-30 DIAGNOSIS — A419 Sepsis, unspecified organism: Secondary | ICD-10-CM | POA: Diagnosis present

## 2022-09-30 DIAGNOSIS — Z8249 Family history of ischemic heart disease and other diseases of the circulatory system: Secondary | ICD-10-CM

## 2022-09-30 DIAGNOSIS — F419 Anxiety disorder, unspecified: Secondary | ICD-10-CM | POA: Diagnosis present

## 2022-09-30 DIAGNOSIS — F319 Bipolar disorder, unspecified: Secondary | ICD-10-CM | POA: Diagnosis present

## 2022-09-30 DIAGNOSIS — K219 Gastro-esophageal reflux disease without esophagitis: Secondary | ICD-10-CM | POA: Diagnosis present

## 2022-09-30 DIAGNOSIS — I959 Hypotension, unspecified: Secondary | ICD-10-CM | POA: Diagnosis present

## 2022-09-30 DIAGNOSIS — Z9884 Bariatric surgery status: Secondary | ICD-10-CM

## 2022-09-30 DIAGNOSIS — Z888 Allergy status to other drugs, medicaments and biological substances status: Secondary | ICD-10-CM

## 2022-09-30 DIAGNOSIS — Z833 Family history of diabetes mellitus: Secondary | ICD-10-CM

## 2022-09-30 DIAGNOSIS — I10 Essential (primary) hypertension: Secondary | ICD-10-CM | POA: Diagnosis present

## 2022-09-30 DIAGNOSIS — Z91011 Allergy to milk products: Secondary | ICD-10-CM

## 2022-09-30 DIAGNOSIS — Z800 Family history of malignant neoplasm of digestive organs: Secondary | ICD-10-CM

## 2022-09-30 LAB — CBC WITH DIFFERENTIAL/PLATELET
Abs Immature Granulocytes: 0.01 10*3/uL (ref 0.00–0.07)
Basophils Absolute: 0 10*3/uL (ref 0.0–0.1)
Basophils Relative: 0 %
Eosinophils Absolute: 0.1 10*3/uL (ref 0.0–0.5)
Eosinophils Relative: 1 %
HCT: 25.5 % — ABNORMAL LOW (ref 36.0–46.0)
Hemoglobin: 8.2 g/dL — ABNORMAL LOW (ref 12.0–15.0)
Immature Granulocytes: 0 %
Lymphocytes Relative: 13 %
Lymphs Abs: 0.9 10*3/uL (ref 0.7–4.0)
MCH: 28.2 pg (ref 26.0–34.0)
MCHC: 32.2 g/dL (ref 30.0–36.0)
MCV: 87.6 fL (ref 80.0–100.0)
Monocytes Absolute: 0.5 10*3/uL (ref 0.1–1.0)
Monocytes Relative: 7 %
Neutro Abs: 5.6 10*3/uL (ref 1.7–7.7)
Neutrophils Relative %: 79 %
Platelets: 285 10*3/uL (ref 150–400)
RBC: 2.91 MIL/uL — ABNORMAL LOW (ref 3.87–5.11)
RDW: 13.9 % (ref 11.5–15.5)
WBC: 7.1 10*3/uL (ref 4.0–10.5)
nRBC: 0 % (ref 0.0–0.2)

## 2022-09-30 MED ORDER — LACTATED RINGERS IV BOLUS
1000.0000 mL | Freq: Once | INTRAVENOUS | Status: AC
  Administered 2022-10-01: 1000 mL via INTRAVENOUS

## 2022-09-30 MED ORDER — VANCOMYCIN HCL IN DEXTROSE 1-5 GM/200ML-% IV SOLN
1000.0000 mg | Freq: Once | INTRAVENOUS | Status: AC
  Administered 2022-09-30: 1000 mg via INTRAVENOUS
  Filled 2022-09-30: qty 200

## 2022-09-30 MED ORDER — FENTANYL CITRATE PF 50 MCG/ML IJ SOSY
50.0000 ug | PREFILLED_SYRINGE | Freq: Once | INTRAMUSCULAR | Status: AC
  Administered 2022-09-30: 50 ug via INTRAVENOUS
  Filled 2022-09-30: qty 1

## 2022-09-30 NOTE — ED Provider Notes (Signed)
[place] EMERGENCY DEPARTMENT AT [facility] Provider Note   CSN: [id number] Arrival date & time: 09/30/22  2244     History  Chief Complaint  Patient presents with   Cellulitis    Angie [name] is a 57 y.o. female.  Patient presents with concern for cellulitis to her left leg and foot for the past 3 days.  States she believes she had a cat that got infected.  She is a diabetic.  She had a leftover doxycycline prescription at home from cellulitis of her other leg.  She has taken 2 days of this and has noticed increasing redness, pain and swelling.  There is redness involving her entire left lower leg and dorsal foot.  Does not think she had a fever but has had nausea.  No chest pain or shortness of breath.  No abdominal pain.  No pain with urination or blood in the urine.  No history of blood clots.  No chest pain or shortness of breath  The history is provided by the patient.       Home Medications Prior to Admission medications   Medication Sig Start Date End Date Taking? Authorizing Provider  ALPRAZolam Prudy Feeler) 1 MG tablet Take 1 tablet (1 mg total) by mouth at bedtime as needed for anxiety. Patient taking differently: Take 1 mg by mouth 3 (three) times daily as needed for anxiety. 05/18/19   Burnadette Pop, MD  atenolol-chlorthalidone (TENORETIC) 50-25 MG tablet Take 1 tablet by mouth daily.    [provider]  blood glucose meter kit and supplies Dispense based on patient and insurance preference. Use up to four times daily as directed. (FOR ICD-10 E10.9, E11.9). 05/18/19   Burnadette Pop, MD  cephALEXin (KEFLEX) 500 MG capsule Take 1 capsule (500 mg total) by mouth 4 (four) times daily. 08/04/22   Dione Booze, MD  Cyanocobalamin (VITAMIN B-12 PO) Place 2 mLs under the tongue daily.    [provider]  cyclobenzaprine (FLEXERIL) 10 MG tablet Take 1 tablet (10 mg total) by mouth 3 (three) times daily as needed for muscle spasms. 05/18/19   Burnadette Pop, MD  dicyclomine (BENTYL) 20 MG tablet Take 20 mg by mouth 2 (two) times daily. 05/10/22   [provider]  famotidine (PEPCID) 40 MG tablet Take 40 mg by mouth daily. 03/17/20   [provider]  furosemide (LASIX) 20 MG tablet Take 20 mg by mouth daily. Patient not taking: Reported on 05/04/2022    [provider]  gabapentin (NEURONTIN) 100 MG capsule Take 2 capsules (200 mg total) by mouth every 12 (twelve) hours. 08/22/19   Kinsinger, De Blanch, MD  lidocaine (XYLOCAINE) 5 % ointment Apply 1 application topically 3 (three) times daily as needed (pain.). Apply to Bilateral shoulders 05/11/16   [provider]  lidocaine-prilocaine (EMLA) cream Apply 1 Application topically 2 (two) times daily as needed. 04/08/22   [provider]  liver oil-zinc oxide (DESITIN) 40 % ointment Apply topically 2 (two) times daily. Apply around feeding tube 05/18/19   Burnadette Pop, MD  magnesium gluconate (MAGONATE) 500 MG tablet Take 1 tablet (500 mg total) by mouth daily. 05/19/19   Burnadette Pop, MD  Multiple Vitamins-Minerals (MULTIVITAMIN WITH IRON-MINERALS) liquid Take 5 mLs by mouth daily. Centrum Liquid Multivitamin    [provider]  naproxen (NAPROSYN) 500 MG tablet Take 1 tablet (500 mg total) by mouth 2 (two) times daily. 08/11/22   Nira Conn, MD  nystatin (MYCOSTATIN/NYSTOP) powder  Apply 1 Application topically 3 (three) times daily. 03/23/21   [provider]  OLANZapine (ZYPREXA) 2.5 MG tablet Take 2.5 mg by mouth daily in the afternoon.  Patient not taking: Reported on 05/04/2022 12/25/17   [provider]  omeprazole (PRILOSEC) 20 MG capsule Take by mouth. 07/03/20   [provider]  ondansetron (ZOFRAN-ODT) 4 MG disintegrating tablet Take 1 tablet (4 mg total) by mouth every 6 (six) hours as needed for nausea or vomiting. 08/22/19   Kinsinger, De Blanch, MD  oxyCODONE 10 MG TABS Take 1 tablet (10 mg total)  by mouth every 6 (six) hours as needed for severe pain. 08/04/22   Dione Booze, MD  oxymetazoline (AFRIN) 0.05 % nasal spray Place 2 sprays into both nostrils 2 (two) times daily as needed for congestion.    [provider]  pantoprazole (PROTONIX) 40 MG tablet Take 1 tablet (40 mg total) by mouth at bedtime. Patient not taking: Reported on 05/04/2022 08/22/19   Kinsinger, De Blanch, MD  potassium chloride SA (KLOR-CON M) 20 MEQ tablet Take 1 tablet (20 mEq total) by mouth 2 (two) times daily. 08/04/22   Dione Booze, MD  protein supplement (PROSOURCE NO CARB) LIQD Take 30 mLs by mouth in the morning and at bedtime.    [provider]  QUEtiapine (SEROQUEL) 50 MG tablet Take 50 mg by mouth at bedtime. 04/06/19   [provider]  sucralfate (CARAFATE) 1 GM/10ML suspension Take 1 g by mouth 2 (two) times daily. 03/17/20   [provider]      Allergies    Iron, Lactose, and Lactose intolerance (gi)    Review of Systems   Review of Systems  Constitutional:  Positive for fatigue. Negative for activity change, appetite change and fever.  HENT:  Negative for congestion and rhinorrhea.   Respiratory:  Negative for cough, chest tightness and shortness of breath.   Cardiovascular:  Negative for chest pain.  Gastrointestinal:  Negative for abdominal pain, nausea and vomiting.  Genitourinary:  Negative for dysuria and hematuria.  Musculoskeletal:  Negative for arthralgias and myalgias.  Skin:  Positive for rash and wound.  Neurological:  Negative for dizziness, weakness and headaches.    all other systems are negative except as noted in the HPI and PMH.   Physical Exam Updated Vital Signs BP (!) 146/90 (BP Location: Left Arm)   Pulse 96   Temp 98.5 F (36.9 C) (Oral)   Resp 18   Ht 5' (1.524 m)   Wt 59 kg   SpO2 100%   BMI 25.39 kg/m  Physical Exam Vitals and nursing note reviewed.  Constitutional:      General: She is not in acute distress.     Appearance: She is well-developed.  HENT:     Head: Normocephalic and atraumatic.     Mouth/Throat:     Pharynx: No oropharyngeal exudate.  Eyes:     Conjunctiva/sclera: Conjunctivae normal.     Pupils: Pupils are equal, round, and reactive to light.  Neck:     Comments: No meningismus. Cardiovascular:     Rate and Rhythm: Normal rate and regular rhythm.     Heart sounds: Normal heart sounds. No murmur heard. Pulmonary:     Effort: Pulmonary effort is normal. No respiratory distress.     Breath sounds: Normal breath sounds.  Abdominal:     Palpations: Abdomen is soft.     Tenderness: There is no abdominal tenderness. There is no guarding or rebound.  Musculoskeletal:        General: Swelling and tenderness present. Normal range of motion.     Cervical back: Normal range of motion and neck supple.     Comments: Diffuse erythema involving left dorsal foot, left lower leg and medial leg.  Compartments are soft.  Intact DP and PT pulse.  Able to wiggle toes.  Flexion extension intact of left hip and knee.  Able to wiggle ankle.  Skin:    General: Skin is warm.  Neurological:     Mental Status: She is alert and oriented to person, place, and time.     Cranial Nerves: No cranial nerve deficit.     Motor: No abnormal muscle tone.     Coordination: Coordination normal.     Comments:  5/5 strength throughout. CN 2-12 intact.Equal grip strength.   Psychiatric:        Behavior: Behavior normal.     ED Results / Procedures / Treatments   Labs (all labs ordered are listed, but only abnormal results are displayed) Labs Reviewed  CBC WITH DIFFERENTIAL/PLATELET - Abnormal; Notable for the following components:      Result Value   RBC 2.91 (*)    Hemoglobin 8.2 (*)    HCT 25.5 (*)    All other components within normal limits  COMPREHENSIVE METABOLIC PANEL - Abnormal; Notable for the following components:   Potassium 2.7 (*)    AST 11 (*)    Total Bilirubin <0.1 (*)    All other  components within normal limits  URINALYSIS, ROUTINE W REFLEX MICROSCOPIC - Abnormal; Notable for the following components:   Color, Urine STRAW (*)    Hgb urine dipstick SMALL (*)    Leukocytes,Ua TRACE (*)    Bacteria, UA RARE (*)    All other components within normal limits  CULTURE, BLOOD (ROUTINE X 2)  CULTURE, BLOOD (ROUTINE X 2)  LACTIC ACID, PLASMA    EKG None  Radiology DG Ankle Complete Left  Result Date: 09/30/2022 CLINICAL DATA:  Cellulitis of the lower left leg/foot with swelling and pain EXAM: LEFT ANKLE COMPLETE - 3+ VIEW COMPARISON:  None Available. FINDINGS: Swelling about the left ankle. No acute fracture or dislocation. No radiographic evidence of osteomyelitis. Plantar calcaneal spur. Vascular calcifications. IMPRESSION: Soft tissue swelling about the ankle without acute osseous abnormality. Electronically Signed   By: Minerva Fester M.D.   On: 09/30/2022 23:53    Procedures Procedures    Medications Ordered in ED Medications - No data to display  ED Course/ Medical Decision Making/ A&P                             Medical Decision Making Amount and/or Complexity of Data Reviewed Labs: ordered. Decision-making details documented in ED Course. Radiology: ordered and independent interpretation performed. Decision-making details documented in ED Course. ECG/medicine tests: ordered and independent interpretation performed. Decision-making details documented in ED Course.  Risk Prescription drug management. Decision regarding hospitalization.   Cellulitis of left lower leg.  Vital signs are stable.  Afebrile.  Neurovascular intact.  Will obtain labs, x-ray to rule out soft tissue gas.  Patient has been on doxycycline for the past 2 days and getting worse.  X-rays show soft tissue swelling.  No fracture, no foreign body or soft tissue gas.  Results reviewed and interpreted by me.  Labs remarkable for hypokalemia of 2.7. Stable anemia.  No significant  leukocytosis.  Patient with  extensive cellulitis, failing outpatient treatment with doxycycline.  She is agreeable to admission for IV antibiotics.  Will also obtain Doppler study in the morning to rule out DVT that this is less likely. Vital signs remained stable.  Admission discussed with Dr. Maisie Fus.        Final Clinical Impression(s) / ED Diagnoses Final diagnoses:  Cellulitis of left leg    Rx / DC Orders ED Discharge Orders     None         Terri Malerba, Jeannett Senior, MD 10/01/22 [phone]

## 2022-09-30 NOTE — ED Provider Notes (Signed)
  Procedures  Ultrasound ED Peripheral IV (Provider)  Date/Time: 09/30/2022 11:21 PM  Performed by: Mannie Stabile, PA-C Authorized by: Mannie Stabile, PA-C   Procedure details:    Indications: hydration and poor IV access     Location:  Left AC   Angiocath:  20 G   Bedside Ultrasound Guided: No     Images: not archived     Patient tolerated procedure without complications: Yes     Dressing applied: Yes   Comments:     Successful IV placed          Mannie Stabile, PA-C 09/30/22 [id number]    Glynn Octave, MD 10/01/22 [id number]

## 2022-09-30 NOTE — ED Triage Notes (Addendum)
Pt BIB EMS with reports of cellulitis to her lower left leg/foot x 3 days. Pt has swelling, pain and redness. Pt is taking doxycycline for the cellulitis that was in her right leg.

## 2022-09-30 NOTE — Progress Notes (Signed)
A consult was received from an ED physician for Vancomycin per pharmacy dosing.  The patient's profile has been reviewed for ht/wt/allergies/indication/available labs.   A one time order has been placed for Vancomycin 1gm IV.  Further antibiotics/pharmacy consults should be ordered by admitting physician if indicated.                       Thank you, Junita Push PharmD 09/30/2022  11:13 PM

## 2022-10-01 ENCOUNTER — Encounter (HOSPITAL_COMMUNITY): Payer: Self-pay | Admitting: Internal Medicine

## 2022-10-01 ENCOUNTER — Inpatient Hospital Stay (HOSPITAL_COMMUNITY): Payer: Medicare HMO

## 2022-10-01 DIAGNOSIS — M79662 Pain in left lower leg: Secondary | ICD-10-CM | POA: Diagnosis not present

## 2022-10-01 DIAGNOSIS — A419 Sepsis, unspecified organism: Secondary | ICD-10-CM | POA: Diagnosis present

## 2022-10-01 DIAGNOSIS — Z800 Family history of malignant neoplasm of digestive organs: Secondary | ICD-10-CM | POA: Diagnosis not present

## 2022-10-01 DIAGNOSIS — F319 Bipolar disorder, unspecified: Secondary | ICD-10-CM | POA: Diagnosis present

## 2022-10-01 DIAGNOSIS — E119 Type 2 diabetes mellitus without complications: Secondary | ICD-10-CM | POA: Diagnosis present

## 2022-10-01 DIAGNOSIS — I959 Hypotension, unspecified: Secondary | ICD-10-CM | POA: Diagnosis present

## 2022-10-01 DIAGNOSIS — F419 Anxiety disorder, unspecified: Secondary | ICD-10-CM | POA: Diagnosis present

## 2022-10-01 DIAGNOSIS — Z9884 Bariatric surgery status: Secondary | ICD-10-CM | POA: Diagnosis not present

## 2022-10-01 DIAGNOSIS — L03116 Cellulitis of left lower limb: Secondary | ICD-10-CM | POA: Diagnosis present

## 2022-10-01 DIAGNOSIS — K219 Gastro-esophageal reflux disease without esophagitis: Secondary | ICD-10-CM | POA: Diagnosis present

## 2022-10-01 DIAGNOSIS — Z8249 Family history of ischemic heart disease and other diseases of the circulatory system: Secondary | ICD-10-CM | POA: Diagnosis not present

## 2022-10-01 DIAGNOSIS — Z833 Family history of diabetes mellitus: Secondary | ICD-10-CM | POA: Diagnosis not present

## 2022-10-01 DIAGNOSIS — D649 Anemia, unspecified: Secondary | ICD-10-CM | POA: Diagnosis present

## 2022-10-01 DIAGNOSIS — Z91011 Allergy to milk products: Secondary | ICD-10-CM | POA: Diagnosis not present

## 2022-10-01 DIAGNOSIS — I10 Essential (primary) hypertension: Secondary | ICD-10-CM | POA: Diagnosis present

## 2022-10-01 DIAGNOSIS — E876 Hypokalemia: Secondary | ICD-10-CM | POA: Diagnosis present

## 2022-10-01 DIAGNOSIS — Z888 Allergy status to other drugs, medicaments and biological substances status: Secondary | ICD-10-CM | POA: Diagnosis not present

## 2022-10-01 DIAGNOSIS — Z87891 Personal history of nicotine dependence: Secondary | ICD-10-CM | POA: Diagnosis not present

## 2022-10-01 DIAGNOSIS — L0390 Cellulitis, unspecified: Secondary | ICD-10-CM

## 2022-10-01 DIAGNOSIS — M797 Fibromyalgia: Secondary | ICD-10-CM | POA: Diagnosis present

## 2022-10-01 DIAGNOSIS — Z79899 Other long term (current) drug therapy: Secondary | ICD-10-CM | POA: Diagnosis not present

## 2022-10-01 DIAGNOSIS — Z818 Family history of other mental and behavioral disorders: Secondary | ICD-10-CM | POA: Diagnosis not present

## 2022-10-01 LAB — URINALYSIS, ROUTINE W REFLEX MICROSCOPIC
Bilirubin Urine: NEGATIVE
Glucose, UA: NEGATIVE mg/dL
Ketones, ur: NEGATIVE mg/dL
Nitrite: NEGATIVE
Protein, ur: NEGATIVE mg/dL
Specific Gravity, Urine: 1.008 (ref 1.005–1.030)
pH: 7 (ref 5.0–8.0)

## 2022-10-01 LAB — COMPREHENSIVE METABOLIC PANEL
ALT: 11 U/L (ref 0–44)
ALT: 10 U/L (ref 0–44)
AST: 11 U/L — ABNORMAL LOW (ref 15–41)
AST: 11 U/L — ABNORMAL LOW (ref 15–41)
Albumin: 3.7 g/dL (ref 3.5–5.0)
Albumin: 2.8 g/dL — ABNORMAL LOW (ref 3.5–5.0)
Alkaline Phosphatase: 77 U/L (ref 38–126)
Alkaline Phosphatase: 53 U/L (ref 38–126)
Anion gap: 8 (ref 5–15)
Anion gap: 7 (ref 5–15)
BUN: 15 mg/dL (ref 6–20)
BUN: 12 mg/dL (ref 6–20)
CO2: 22 mmol/L (ref 22–32)
CO2: 22 mmol/L (ref 22–32)
Calcium: 9 mg/dL (ref 8.9–10.3)
Calcium: 8.4 mg/dL — ABNORMAL LOW (ref 8.9–10.3)
Chloride: 107 mmol/L (ref 98–111)
Chloride: 109 mmol/L (ref 98–111)
Creatinine, Ser: 0.86 mg/dL (ref 0.44–1.00)
Creatinine, Ser: 0.75 mg/dL (ref 0.44–1.00)
GFR, Estimated: >60 mL/min (ref >60)
GFR, Estimated: >60 mL/min (ref >60)
Glucose, Bld: 90 mg/dL (ref 70–99)
Glucose, Bld: 59 mg/dL — ABNORMAL LOW (ref 70–99)
Potassium: 2.7 mmol/L — CL (ref 3.5–5.1)
Potassium: 3.4 mmol/L — ABNORMAL LOW (ref 3.5–5.1)
Sodium: 137 mmol/L (ref 135–145)
Sodium: 138 mmol/L (ref 135–145)
Total Bilirubin: <0.1 mg/dL — ABNORMAL LOW (ref 0.3–1.2)
Total Bilirubin: 0.2 mg/dL — ABNORMAL LOW (ref 0.3–1.2)
Total Protein: 7.7 g/dL (ref 6.5–8.1)
Total Protein: 6 g/dL — ABNORMAL LOW (ref 6.5–8.1)

## 2022-10-01 LAB — HEMOGLOBIN A1C
Hgb A1c MFr Bld: 5.6 % (ref 4.8–5.6)
Mean Plasma Glucose: 114.02 mg/dL

## 2022-10-01 LAB — RAPID URINE DRUG SCREEN, HOSP PERFORMED
Amphetamines: NOT DETECTED
Amphetamines: NOT DETECTED
Barbiturates: NOT DETECTED
Barbiturates: NOT DETECTED
Benzodiazepines: NOT DETECTED
Benzodiazepines: NOT DETECTED
Cocaine: POSITIVE — AB
Cocaine: POSITIVE — AB
Opiates: NOT DETECTED
Opiates: NOT DETECTED
Tetrahydrocannabinol: NOT DETECTED
Tetrahydrocannabinol: NOT DETECTED

## 2022-10-01 LAB — CBC
HCT: 23 % — ABNORMAL LOW (ref 36.0–46.0)
Hemoglobin: 7.5 g/dL — ABNORMAL LOW (ref 12.0–15.0)
MCH: 28.5 pg (ref 26.0–34.0)
MCHC: 32.6 g/dL (ref 30.0–36.0)
MCV: 87.5 fL (ref 80.0–100.0)
Platelets: 232 10*3/uL (ref 150–400)
RBC: 2.63 MIL/uL — ABNORMAL LOW (ref 3.87–5.11)
RDW: 13.9 % (ref 11.5–15.5)
WBC: 5.9 10*3/uL (ref 4.0–10.5)
nRBC: 0 % (ref 0.0–0.2)

## 2022-10-01 LAB — GLUCOSE, CAPILLARY
Glucose-Capillary: 151 mg/dL — ABNORMAL HIGH (ref 70–99)
Glucose-Capillary: 131 mg/dL — ABNORMAL HIGH (ref 70–99)
Glucose-Capillary: 102 mg/dL — ABNORMAL HIGH (ref 70–99)

## 2022-10-01 LAB — C-REACTIVE PROTEIN: CRP: 9.8 mg/dL — ABNORMAL HIGH (ref <1.0)

## 2022-10-01 LAB — CULTURE, BLOOD (ROUTINE X 2)

## 2022-10-01 LAB — LACTIC ACID, PLASMA: Lactic Acid, Venous: 0.8 mmol/L (ref 0.5–1.9)

## 2022-10-01 LAB — HIV ANTIBODY (ROUTINE TESTING W REFLEX): HIV Screen 4th Generation wRfx: NONREACTIVE

## 2022-10-01 LAB — PROCALCITONIN: Procalcitonin: <0.1 ng/mL

## 2022-10-01 MED ORDER — OXYCODONE HCL 5 MG PO TABS
15.0000 mg | ORAL_TABLET | Freq: Four times a day (QID) | ORAL | Status: DC | PRN
  Administered 2022-10-01 (×2): 15 mg via ORAL
  Filled 2022-10-01 (×2): qty 3

## 2022-10-01 MED ORDER — ACETAMINOPHEN 500 MG PO TABS
1000.0000 mg | ORAL_TABLET | Freq: Three times a day (TID) | ORAL | Status: DC
  Administered 2022-10-01: 1000 mg via ORAL
  Filled 2022-10-01 (×4): qty 2

## 2022-10-01 MED ORDER — ONDANSETRON 8 MG PO TBDP: 8.0000 mg | ORAL_TABLET | Freq: Three times a day (TID) | ORAL | Status: DC | PRN

## 2022-10-01 MED ORDER — POTASSIUM CHLORIDE CRYS ER 20 MEQ PO TBCR
40.0000 meq | EXTENDED_RELEASE_TABLET | Freq: Once | ORAL | Status: AC
  Administered 2022-10-01: 40 meq via ORAL
  Filled 2022-10-01: qty 2

## 2022-10-01 MED ORDER — OXYCODONE HCL 5 MG PO TABS
15.0000 mg | ORAL_TABLET | ORAL | Status: DC | PRN
  Administered 2022-10-01 – 2022-10-03 (×12): 15 mg via ORAL
  Filled 2022-10-01 (×12): qty 3

## 2022-10-01 MED ORDER — IBUPROFEN 200 MG PO TABS
400.0000 mg | ORAL_TABLET | Freq: Four times a day (QID) | ORAL | Status: DC
  Filled 2022-10-01 (×3): qty 2

## 2022-10-01 MED ORDER — SODIUM CHLORIDE 0.9 % IV SOLN
1.0000 g | INTRAVENOUS | Status: DC
  Administered 2022-10-01 – 2022-10-03 (×3): 1 g via INTRAVENOUS
  Filled 2022-10-01 (×3): qty 10

## 2022-10-01 MED ORDER — DICYCLOMINE HCL 20 MG PO TABS
20.0000 mg | ORAL_TABLET | Freq: Two times a day (BID) | ORAL | Status: DC
  Administered 2022-10-01 – 2022-10-03 (×5): 20 mg via ORAL
  Filled 2022-10-01 (×5): qty 1

## 2022-10-01 MED ORDER — QUETIAPINE FUMARATE 100 MG PO TABS
100.0000 mg | ORAL_TABLET | Freq: Every day | ORAL | Status: DC
  Administered 2022-10-01 – 2022-10-02 (×2): 100 mg via ORAL
  Filled 2022-10-01 (×2): qty 1

## 2022-10-01 MED ORDER — ONDANSETRON HCL 4 MG PO TABS: 4.0000 mg | ORAL_TABLET | Freq: Four times a day (QID) | ORAL | Status: DC | PRN

## 2022-10-01 MED ORDER — METHOCARBAMOL 500 MG PO TABS
500.0000 mg | ORAL_TABLET | Freq: Once | ORAL | Status: AC
  Administered 2022-10-01: 500 mg via ORAL
  Filled 2022-10-01: qty 1

## 2022-10-01 MED ORDER — SODIUM CHLORIDE 0.9 % IV SOLN: INTRAVENOUS | Status: DC

## 2022-10-01 MED ORDER — ACETAMINOPHEN 325 MG PO TABS: 650.0000 mg | ORAL_TABLET | Freq: Four times a day (QID) | ORAL | Status: DC | PRN

## 2022-10-01 MED ORDER — ACETAMINOPHEN 650 MG RE SUPP: 650.0000 mg | Freq: Four times a day (QID) | RECTAL | Status: DC | PRN

## 2022-10-01 MED ORDER — VANCOMYCIN HCL IN DEXTROSE 1-5 GM/200ML-% IV SOLN: 1000.0000 mg | INTRAVENOUS | Status: DC

## 2022-10-01 MED ORDER — ALBUTEROL SULFATE (2.5 MG/3ML) 0.083% IN NEBU: 2.5000 mg | INHALATION_SOLUTION | RESPIRATORY_TRACT | Status: DC | PRN

## 2022-10-01 MED ORDER — POTASSIUM CHLORIDE 10 MEQ/100ML IV SOLN
10.0000 meq | INTRAVENOUS | Status: AC
  Administered 2022-10-01 (×2): 10 meq via INTRAVENOUS
  Filled 2022-10-01 (×2): qty 100

## 2022-10-01 MED ORDER — CEFTRIAXONE SODIUM 1 G IJ SOLR: 1.0000 g | INTRAMUSCULAR | Status: DC

## 2022-10-01 MED ORDER — INSULIN ASPART 100 UNIT/ML IJ SOLN
0.0000 [IU] | Freq: Three times a day (TID) | INTRAMUSCULAR | Status: DC
  Administered 2022-10-01 – 2022-10-02 (×2): 2 [IU] via SUBCUTANEOUS

## 2022-10-01 MED ORDER — OXYCODONE-ACETAMINOPHEN 5-325 MG PO TABS
2.0000 | ORAL_TABLET | Freq: Once | ORAL | Status: AC
  Administered 2022-10-01: 2 via ORAL
  Filled 2022-10-01: qty 2

## 2022-10-01 MED ORDER — ONDANSETRON HCL 4 MG/2ML IJ SOLN: 4.0000 mg | Freq: Four times a day (QID) | INTRAMUSCULAR | Status: DC | PRN

## 2022-10-01 MED ORDER — HEPARIN SODIUM (PORCINE) 5000 UNIT/ML IJ SOLN
5000.0000 [IU] | Freq: Three times a day (TID) | INTRAMUSCULAR | Status: DC
  Administered 2022-10-01 – 2022-10-03 (×7): 5000 [IU] via SUBCUTANEOUS
  Filled 2022-10-01 (×7): qty 1

## 2022-10-01 NOTE — Progress Notes (Signed)
Left lower extremity venous study completed.   Preliminary results relayed to MD and RN.  Please see CV Procedures for preliminary results.  Flynn Gwyn, RVT  1:12 PM 10/01/22

## 2022-10-01 NOTE — Plan of Care (Signed)
  Problem: Education: Goal: Knowledge of General Education information will improve Description: Including pain rating scale, medication(s)/side effects and non-pharmacologic comfort measures Outcome: Progressing   Problem: Health Behavior/Discharge Planning: Goal: Ability to manage health-related needs will improve Outcome: Progressing   Problem: Clinical Measurements: Goal: Ability to maintain clinical measurements within normal limits will improve Outcome: Progressing Goal: Will remain free from infection Outcome: Progressing Goal: Diagnostic test results will improve Outcome: Progressing Goal: Respiratory complications will improve Outcome: Progressing Goal: Cardiovascular complication will be avoided Outcome: Progressing   Problem: Activity: Goal: Risk for activity intolerance will decrease Outcome: Progressing   Problem: Nutrition: Goal: Adequate nutrition will be maintained Outcome: Progressing   Problem: Coping: Goal: Level of anxiety will decrease Outcome: Progressing   Problem: Elimination: Goal: Will not experience complications related to bowel motility Outcome: Progressing Goal: Will not experience complications related to urinary retention Outcome: Progressing   Problem: Pain Managment: Goal: General experience of comfort will improve Outcome: Progressing   Problem: Skin Integrity: Goal: Risk for impaired skin integrity will decrease Outcome: Progressing   Problem: Safety: Goal: Ability to remain free from injury will improve Outcome: Progressing   Problem: Education: Goal: Ability to describe self-care measures that may prevent or decrease complications (Diabetes Survival Skills Education) will improve Outcome: Progressing Goal: Individualized Educational Video(s) Outcome: Progressing   Problem: Coping: Goal: Ability to adjust to condition or change in health will improve Outcome: Progressing   Problem: Fluid Volume: Goal: Ability to  maintain a balanced intake and output will improve Outcome: Progressing   Problem: Health Behavior/Discharge Planning: Goal: Ability to identify and utilize available resources and services will improve Outcome: Progressing Goal: Ability to manage health-related needs will improve Outcome: Progressing   Problem: Metabolic: Goal: Ability to maintain appropriate glucose levels will improve Outcome: Progressing   Problem: Nutritional: Goal: Maintenance of adequate nutrition will improve Outcome: Progressing Goal: Progress toward achieving an optimal weight will improve Outcome: Progressing   Problem: Skin Integrity: Goal: Risk for impaired skin integrity will decrease Outcome: Progressing   Problem: Tissue Perfusion: Goal: Adequacy of tissue perfusion will improve Outcome: Progressing

## 2022-10-01 NOTE — Progress Notes (Signed)
Pharmacy Antibiotic Note  Angie [name] is a 57 y.o. female admitted on 09/30/2022 with cellulitis.  Area continues to worsen despite oral doxyxycline as outpatient. Pharmacy has been consulted for Vancomycin dosing.  Plan: Vancomycin 1gm IV q24h to target AUC 400-550; estimated AUC on this regimen is 486. Monitor renal function and cx data    Height: 5' (152.4 cm) Weight: 59 kg (130 lb) IBW/kg (Calculated) : 45.5  Temp (24hrs), Avg:98.5 F (36.9 C), Min:98.5 F (36.9 C), Max:98.5 F (36.9 C)  Recent Labs  Lab 09/30/22 2320 09/30/22 2326  WBC  --  7.1  CREATININE  --  0.86  LATICACIDVEN 0.8  --     Estimated Creatinine Clearance: 59.4 mL/min (by C-G formula based on SCr of 0.86 mg/dL).    Allergies  Allergen Reactions   Iron Anaphylaxis and Itching    Had acute allergy with tachycardia, attention and generalized itching shortly after receiving nulecit ( iron gluconate) infusion.  Had acute allergy with tachycardia, attention and generalized itching shortly after receiving nulecit ( iron gluconate) infusion.  Had acute allergy with tachycardia, attention and generalized itching shortly after receiving nulecit ( iron gluconate) infusion.  Had acute allergy with tachycardia, attention and generalized itching shortly after receiving nulecit ( iron gluconate) infusion., Had acute allergy with tachycardia, attention and generalized itching shortly after receiving nulecit ( iron gluconate) infusion.   Lactose Other (See Comments)   Lactose Intolerance (Gi) Other (See Comments)    Antimicrobials this admission: 6/6 Vancomycin >>  6/7 Rocephin >>   Dose adjustments this admission:  Microbiology results: 6/6 BCx:  UCx:  Thank you for allowing pharmacy to be a part of this patient's care.  Junita Push PharmD 10/01/2022 3:41 AM

## 2022-10-01 NOTE — H&P (Signed)
History and Physical    Angie [name] ZOX:[id number] DOB: 09/16/1965 DOA: 09/30/2022  PCP: Jearld Lesch, MD  Patient coming from: home  I have personally briefly reviewed patient's old medical records in [facility]  Chief Complaint:  cellulitis left lower extremity   HPI: Angie [name] is a 57 y.o. 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 Patient presents to ED BIB EMS wit 3 days of swelling pain redness to her left lower extremity. Per patient she was seen in urgent care and given doxycycline. She note he had take abx x 2 days without any improvement. Due to this patient presents to Ed.  ED Course:  Afeb, Bp 146/90, hr 96, rr 18 ,sat 100% Lactic acit 0.8, Wbc 7.1, hgb 8.2, plt 285 Na 137, K 327, CL 107, cr 0.86, AST 11,  Alkphos 77 cxr IMPRESSION: Soft tissue swelling about the ankle without acute osseous abnormality.   Tx  fentanyl /vanc/1Lr/ oxycodone/ potassium   Review of Systems: As per HPI otherwise 10 point review of systems negative.   Past Medical History:  Diagnosis Date   Abnormal Pap smear    Alcohol abuse    Anemia    IDA   Anxiety    Takes xanax   Arthritis    left hip/knees, lower spine   Bilateral lower extremity edema    Bipolar disorder (HCC)    Blood transfusion without reported diagnosis last done 04-25-17   Bulging lumbar disc    Bursitis    left shoulder   Chronic lower back pain    Cut    right middle finger with knife small cut healing pt instructed to keep clean and dry no redness or drainage   Depression    Diverticulitis    Fibromyalgia    Gastrointestinal tube present (HCC)    GERD (gastroesophageal reflux disease)    Hypertension    none since weight loss, no medications at this time   Hypoglycemia    Lactose  intolerance in adult 2017   Migraines    "weekly @ least" (04/26/2016)   Seizures (HCC)    one Dec. 2017 due to throat closing   Type II diabetes mellitus (HCC)    "before the gastric bypass" (04/26/2016) states she no longer has diabetes    Past Surgical History:  Procedure Laterality Date   BALLOON DILATION N/A 05/27/2016   Procedure: BALLOON DILATION;  Surgeon: Sherrilyn Rist, MD;  Location: [facility];  Service: Gastroenterology;  Laterality: N/A;   BALLOON DILATION N/A 04/21/2017   Procedure: BALLOON DILATION;  Surgeon: Sherrilyn Rist, MD;  Location[facility] ENDOSCOPY;  Service: Gastroenterology;  Laterality: N/A;   CERVICAL CONE BIOPSY     CESAREAN SECTION  1993; 1996; 1998; 2014   CESAREAN SECTION WITH BILATERAL TUBAL LIGATION Bilateral 10/28/2012   Procedure: Repeat cesarean section with delivery of baby boy at 1210. Apgars 8/9.  BILATERAL TUBAL LIGATION;  Surgeon: Lazaro Arms, MD;  Location: [facility] ORS;  Service: Obstetrics;  Laterality: Bilateral;   DILATION AND CURETTAGE OF UTERUS     ESOPHAGOGASTRODUODENOSCOPY N/A 05/27/2016   Procedure: ESOPHAGOGASTRODUODENOSCOPY (EGD);  Surgeon: Sherrilyn Rist, MD;  Location: [facility];  Service: Gastroenterology;  Laterality: N/A;   ESOPHAGOGASTRODUODENOSCOPY (EGD) WITH PROPOFOL N/A 04/23/2016   Procedure: ESOPHAGOGASTRODUODENOSCOPY (EGD) WITH PROPOFOL;  Surgeon: Sherrilyn Rist, MD;  Location: [facility] ENDOSCOPY;  Service: Endoscopy;  Laterality: N/A;   ESOPHAGOGASTRODUODENOSCOPY (EGD) WITH PROPOFOL N/A 01/03/2017   Procedure: ESOPHAGOGASTRODUODENOSCOPY (EGD) WITH PROPOFOL;  Surgeon: Sherrilyn Rist, MD;  Location: [facility] ENDOSCOPY;  Service: Gastroenterology;  Laterality: N/A;   ESOPHAGOGASTRODUODENOSCOPY (EGD) WITH PROPOFOL N/A 04/21/2017   Procedure: ESOPHAGOGASTRODUODENOSCOPY (EGD) WITH PROPOFOL;  Surgeon: Sherrilyn Rist, MD;  Location: [facility] ENDOSCOPY;  Service: Gastroenterology;  Laterality: N/A;   ESOPHAGOGASTRODUODENOSCOPY (EGD) WITH  PROPOFOL N/A 05/02/2019   Procedure: ESOPHAGOGASTRODUODENOSCOPY (EGD) WITH PROPOFOL;  Surgeon: Hilarie Fredrickson, MD;  Location: [facility] ENDOSCOPY;  Service: Endoscopy;  Laterality: N/A;   GASTROSTOMY N/A 08/16/2016   Procedure: LAPAROSCOPIC INSERTION OF GASTROSTOMY TUBE IN REMNANT STOMACH;  Surgeon: De Blanch Kinsinger, MD;  Location: [facility] ORS;  Service: General;  Laterality: N/A;   GASTROSTOMY N/A 05/16/2017   Procedure: Replacement of  GASTROSTOMY TUBE;  Surgeon: Rodman Pickle, MD;  Location: [facility];  Service: General;  Laterality: N/A;   IR CM INJ ANY COLONIC TUBE W/FLUORO  05/15/2019   LAPAROSCOPIC INSERTION GASTROSTOMY TUBE Left 05/04/2019   Procedure: LAPAROSCOPIC INSERTION GASTROSTOMY TUBE;  Surgeon: Rodman Pickle, MD;  Location: [facility] OR;  Service: General;  Laterality: Left;   LAPAROSCOPIC REVISION OF GASTROJEJUNOSTOMY Left 05/16/2017   Procedure: LAPAROSCOPIC REVISION OF GASTROJEJUNOSTOMY;  Surgeon: Rodman Pickle, MD;  Location: [facility] OR[facility];  Service: General;  Laterality: Left;   LAPAROSCOPIC REVISION OF ROUXENY WITH UPPER ENDOSCOPY N/A 08/13/2019   Procedure: LAPAROSCOPIC REVISION OF ROUX EN Y WITH UPPER ENDOSCOPY; REMOVAL GASTROSTOMY TUBE, TAKEDOWN OF GASTROJEJUNOSTOMY; RESECTION OF SMALL INTESTINE ERAS PATHWAY;  Surgeon: Kinsinger, De Blanch, MD;  Location: [facility];  Service: General;  Laterality: N/A;   ROUX-EN-Y GASTRIC BYPASS  2007   TUBAL LIGATION  2014     reports that she quit smoking about 10 years ago. Her smoking use included cigarettes. She has a 2.00 pack-year smoking history. She has never used smokeless tobacco. She reports current alcohol use of about 17.0 standard drinks of alcohol per week. She reports that she does not use drugs.  Allergies  Allergen Reactions   Iron Anaphylaxis and Itching    Had acute allergy with tachycardia, attention and generalized itching shortly after receiving nulecit ( iron gluconate) infusion.  Had acute allergy with tachycardia,  attention and generalized itching shortly after receiving nulecit ( iron gluconate) infusion.  Had acute allergy with tachycardia, attention and generalized itching shortly after receiving nulecit ( iron gluconate) infusion.  Had acute allergy with tachycardia, attention and generalized itching shortly after receiving nulecit ( iron gluconate) infusion., Had acute allergy with tachycardia, attention and generalized itching shortly after receiving nulecit ( iron gluconate) infusion.   Lactose Other (See Comments)   Lactose Intolerance (Gi) Other (See Comments)    Family History  Problem Relation Age of Onset   Diabetes Father    Heart disease Father    Depression Maternal Grandmother    Heart disease Maternal Grandfather    Depression Paternal Grandmother    Colon cancer Paternal Grandfather    Pancreatic cancer Paternal Grandfather     Prior to Admission medications   Medication Sig Start Date End Date Taking? Authorizing Provider  atenolol-chlorthalidone (TENORETIC) 50-25 MG tablet Take 1 tablet by mouth daily.   Yes [provider]  Calcium 200 MG TABS Take 1 tablet by mouth daily.   Yes [provider]  cholecalciferol (VITAMIN D3) 25 MCG (1000 UNIT) tablet Take 1,000 Units  by mouth daily.   Yes [provider]  Cyanocobalamin (VITAMIN B-12 PO) Place 2 mLs under the tongue daily.   Yes [provider]  dicyclomine (BENTYL) 20 MG tablet Take 20 mg by mouth 2 (two) times daily. 05/10/22  Yes [provider]  magnesium 30 MG tablet Take 30 mg by mouth 2 (two) times daily.   Yes [provider]  naloxone (NARCAN) nasal spray 4 mg/0.1 mL Place 1 spray into the nose once. 06/11/20  Yes [provider]  ondansetron (ZOFRAN-ODT) 8 MG disintegrating tablet Take 8 mg by mouth every 8 (eight) hours as needed for nausea or vomiting.   Yes [provider]  oxyCODONE (ROXICODONE) 15 MG immediate release tablet Take 15 mg by mouth  every 6 (six) hours as needed for pain.   Yes [provider]  Potassium 99 MG TABS Take 1 tablet by mouth in the morning and at bedtime.   Yes [provider]  QUEtiapine (SEROQUEL) 100 MG tablet Take 100 mg by mouth at bedtime.   Yes [provider]  ALPRAZolam Prudy Feeler) 1 MG tablet Take 1 tablet (1 mg total) by mouth at bedtime as needed for anxiety. Patient not taking: Reported on 10/01/2022 05/18/19   Burnadette Pop, MD  blood glucose meter kit and supplies Dispense based on patient and insurance preference. Use up to four times daily as directed. (FOR ICD-10 E10.9, E11.9). 05/18/19   Burnadette Pop, MD  cephALEXin (KEFLEX) 500 MG capsule Take 1 capsule (500 mg total) by mouth 4 (four) times daily. Patient not taking: Reported on 10/01/2022 08/04/22   Dione Booze, MD  cyclobenzaprine (FLEXERIL) 10 MG tablet Take 1 tablet (10 mg total) by mouth 3 (three) times daily as needed for muscle spasms. Patient not taking: Reported on 10/01/2022 05/18/19   Burnadette Pop, MD  gabapentin (NEURONTIN) 100 MG capsule Take 2 capsules (200 mg total) by mouth every 12 (twelve) hours. Patient not taking: Reported on 10/01/2022 08/22/19   Kinsinger, De Blanch, MD  liver oil-zinc oxide (DESITIN) 40 % ointment Apply topically 2 (two) times daily. Apply around feeding tube Patient not taking: Reported on 10/01/2022 05/18/19   Burnadette Pop, MD  magnesium gluconate (MAGONATE) 500 MG tablet Take 1 tablet (500 mg total) by mouth daily. Patient not taking: Reported on 10/01/2022 05/19/19   Burnadette Pop, MD  naproxen (NAPROSYN) 500 MG tablet Take 1 tablet (500 mg total) by mouth 2 (two) times daily. Patient not taking: Reported on 10/01/2022 08/11/22   Nira Conn, MD  ondansetron (ZOFRAN-ODT) 4 MG disintegrating tablet Take 1 tablet (4 mg total) by mouth every 6 (six) hours as needed for nausea or vomiting. Patient not taking: Reported on 10/01/2022 08/22/19   Kinsinger, De Blanch, MD  oxyCODONE  10 MG TABS Take 1 tablet (10 mg total) by mouth every 6 (six) hours as needed for severe pain. Patient not taking: Reported on 10/01/2022 08/04/22   Dione Booze, MD  oxymetazoline (AFRIN) 0.05 % nasal spray Place 2 sprays into both nostrils 2 (two) times daily as needed for congestion. Patient not taking: Reported on 10/01/2022    [provider]  pantoprazole (PROTONIX) 40 MG tablet Take 1 tablet (40 mg total) by mouth at bedtime. Patient not taking: Reported on 05/04/2022 08/22/19   Kinsinger, De Blanch, MD  potassium chloride SA (KLOR-CON M) 20 MEQ tablet Take 1 tablet (20 mEq total) by mouth 2 (two) times daily. Patient not taking: Reported on 10/01/2022 08/04/22  Dione Booze, MD    Physical Exam: Vitals:   09/30/22 2252  BP: (!) 146/90  Pulse: 96  Resp: 18  Temp: 98.5 F (36.9 C)  TempSrc: Oral  SpO2: 100%  Weight: 59 kg  Height: 5' (1.524 m)    Constitutional: NAD, calm, comfortable Vitals:   09/30/22 2252  BP: (!) 146/90  Pulse: 96  Resp: 18  Temp: 98.5 F (36.9 C)  TempSrc: Oral  SpO2: 100%  Weight: 59 kg  Height: 5' (1.524 m)   Eyes: PERRL, lids and conjunctivae normal ENMT: Mucous membranes are moist. Posterior pharynx clear of any exudate or lesions.Normal dentition.  Neck: normal, supple, no masses, no thyromegaly Respiratory: clear to auscultation bilaterally, no wheezing, no crackles. Normal respiratory effort. No accessory muscle use.  Cardiovascular: Regular rate and rhythm, no murmurs / rubs / gallops. No extremity edema. 2+ pedal pulses. No carotid bruits.  Abdomen: no tenderness, no masses palpated. No hepatosplenomegaly. Bowel sounds positive.  Musculoskeletal: no clubbing / cyanosis. No joint deformity upper and lower extremities. Good ROM, no contractures. Normal muscle tone.  Skin: + left lower extremity with from forefoot to above ankle noted swelling , redness that very tender to touch, Neurologic: CN 2-12 grossly intact. Sensation intact,.  Strength 5/5 in all 4.  Psychiatric: Normal judgment and insight. Alert and oriented x 3. Normal mood.    Labs on Admission: I have personally reviewed following labs and imaging studies  CBC: Recent Labs  Lab 09/30/22 2326  WBC 7.1  NEUTROABS 5.6  HGB 8.2*  HCT 25.5*  MCV 87.6  PLT 285   Basic Metabolic Panel: Recent Labs  Lab 09/30/22 2326  NA 137  K 2.7*  CL 107  CO2 22  GLUCOSE 90  BUN 15  CREATININE 0.86  CALCIUM 9.0   GFR: Estimated Creatinine Clearance: 59.4 mL/min (by C-G formula based on SCr of 0.86 mg/dL). Liver Function Tests: Recent Labs  Lab 09/30/22 2326  AST 11*  ALT 11  ALKPHOS 77  BILITOT <0.1*  PROT 7.7  ALBUMIN 3.7   No results for input(s): "LIPASE", "AMYLASE" in the last 168 hours. No results for input(s): "AMMONIA" in the last 168 hours. Coagulation Profile: No results for input(s): "INR", "PROTIME" in the last 168 hours. Cardiac Enzymes: No results for input(s): "CKTOTAL", "CKMB", "CKMBINDEX", "TROPONINI" in the last 168 hours. BNP (last 3 results) No results for input(s): "PROBNP" in the last 8760 hours. HbA1C: No results for input(s): "HGBA1C" in the last 72 hours. CBG: No results for input(s): "GLUCAP" in the last 168 hours. Lipid Profile: No results for input(s): "CHOL", "HDL", "LDLCALC", "TRIG", "CHOLHDL", "LDLDIRECT" in the last 72 hours. Thyroid Function Tests: No results for input(s): "TSH", "T4TOTAL", "FREET4", "T3FREE", "THYROIDAB" in the last 72 hours. Anemia Panel: No results for input(s): "VITAMINB12", "FOLATE", "FERRITIN", "TIBC", "IRON", "RETICCTPCT" in the last 72 hours. Urine analysis:    Component Value Date/Time   COLORURINE STRAW (A) 10/01/2022 0059   APPEARANCEUR CLEAR 10/01/2022 0059   LABSPEC 1.008 10/01/2022 0059   PHURINE 7.0 10/01/2022 0059   GLUCOSEU NEGATIVE 10/01/2022 0059   HGBUR SMALL (A) 10/01/2022 0059   BILIRUBINUR NEGATIVE 10/01/2022 0059   KETONESUR NEGATIVE 10/01/2022 0059   PROTEINUR  NEGATIVE 10/01/2022 0059   UROBILINOGEN 0.2 10/26/2012 1115   NITRITE NEGATIVE 10/01/2022 0059   LEUKOCYTESUR TRACE (A) 10/01/2022 0059    Radiological Exams on Admission: DG Ankle Complete Left  Result Date: 09/30/2022 CLINICAL DATA:  Cellulitis of the lower left leg/foot with  swelling and pain EXAM: LEFT ANKLE COMPLETE - 3+ VIEW COMPARISON:  None Available. FINDINGS: Swelling about the left ankle. No acute fracture or dislocation. No radiographic evidence of osteomyelitis. Plantar calcaneal spur. Vascular calcifications. IMPRESSION: Soft tissue swelling about the ankle without acute osseous abnormality. Electronically Signed   By: Minerva Fester M.D.   On: 09/30/2022 23:53    EKG: Independently reviewed.   Assessment/Plan    Cellulitis of left lower extremity  - noted failure of out patient abx  - will continue with vanc/ctx  -de-escalate as able  -f/u on inflammatory markers  - cxr: notes soft tissues swelling    Fibromyalgia  Chronic pain  Djd of the spine  -resume home pain regimen   Hx of DMII, HTN  -all resolved s/p gastric bypass -last A1c 4.7  -patient noted no current medication for either diagnosis   Hx of polysubstance abuse -(etoh,cocaine) - check UDS -per patient in remission   GERD -ppi  DVT prophylaxis: heparin Code Status: full/ as discussed per patient wishes in event of cardiac arrest  Family Communication: none at bedside Disposition Plan: patient  expected to be admitted greater than 2 midnights  Consults called: n/a Admission status: progressive care   Lurline Del MD [facility]   If 7PM-7AM, please contact night-coverage [email or web address] Password [email or web address]  10/01/2022, 3:08 AM
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
## 2022-10-01 NOTE — ED Notes (Signed)
ED TO INPATIENT HANDOFF REPORT  Name/Age/Gender Angie [name] 57 y.o. female  Code Status Code Status History     Date Active Date Inactive Code Status Order ID Comments User Context   08/13/2019 1520 08/22/2019 1719 Full Code [id number]  Kinsinger, De Blanch, MD Inpatient   05/12/2019 2253 05/19/2019 0021 Full Code [id number]  Thalia Party, DO ED   04/29/2019 2240 05/11/2019 1712 Full Code [id number]  Eduard Clos, MD ED   05/16/2017 1527 05/25/2017 1441 Full Code [id number]  Kinsinger, De Blanch, MD Inpatient   04/25/2017 1741 04/28/2017 1744 Full Code [id number]  Shon Hale, MD ED   08/16/2016 1759 08/21/2016 1950 Full Code [id number]  Kinsinger, De Blanch, MD Inpatient   07/21/2016 1352 07/26/2016 0646 Full Code [id number]  Marinda Elk, MD Inpatient   04/22/2016 0544 04/28/2016 0045 Full Code [id number]  Eduard Clos, MD ED   02/01/2016 1351 02/06/2016 0200 Full Code [id number]  Gwynn Burly, DO Inpatient       Home/SNF/Other Home  Chief Complaint Sepsis due to cellulitis (HCC) [L03.90, A41.9]  Level of Care/Admitting Diagnosis ED Disposition     ED Disposition  Admit   Condition  --   Comment  Hospital [facility] [[id number]]  Level of Care: Med-Surg [16]  May admit patient to [facility] or [facility] if equivalent level of care is available:: No  Covid Evaluation: Asymptomatic - no recent exposure (last 10 days) testing not required  Diagnosis: Sepsis due to cellulitis [facility]) [[id number]]  Admitting Physician: Lurline Del [[id number]]  Attending Physician: Lurline Del [[id number]]  Certification:: I certify this patient will need inpatient services for at least 2 midnights  Estimated Length of Stay: 3          Medical History Past Medical History:  Diagnosis Date   Abnormal Pap smear    Alcohol abuse    Anemia    IDA   Anxiety    Takes xanax   Arthritis    left hip/knees, lower spine   Bilateral lower  extremity edema    Bipolar disorder (HCC)    Blood transfusion without reported diagnosis last done 04-25-17   Bulging lumbar disc    Bursitis    left shoulder   Chronic lower back pain    Cut    right middle finger with knife small cut healing pt instructed to keep clean and dry no redness or drainage   Depression    Diverticulitis    Fibromyalgia    Gastrointestinal tube present (HCC)    GERD (gastroesophageal reflux disease)    Hypertension    none since weight loss, no medications at this time   Hypoglycemia    Lactose intolerance in adult 2017   Migraines    "weekly @ least" (04/26/2016)   Seizures (HCC)    one Dec. 2017 due to throat closing   Type II diabetes mellitus (HCC)    "before the gastric bypass" (04/26/2016) states she no longer has diabetes    Allergies Allergies  Allergen Reactions   Iron Anaphylaxis and Itching    Had acute allergy with tachycardia, attention and generalized itching shortly after receiving nulecit ( iron gluconate) infusion.  Had acute allergy with tachycardia, attention and generalized itching shortly after receiving nulecit ( iron gluconate) infusion.  Had acute allergy with tachycardia, attention and generalized itching shortly after receiving nulecit ( iron gluconate) infusion.  Had acute allergy with tachycardia, attention and generalized itching shortly after receiving  nulecit ( iron gluconate) infusion., Had acute allergy with tachycardia, attention and generalized itching shortly after receiving nulecit ( iron gluconate) infusion.   Lactose Other (See Comments)   Lactose Intolerance (Gi) Other (See Comments)    IV Location/Drains/Wounds Patient Lines/Drains/Airways Status     Active Line/Drains/Airways     Name Placement date Placement time Site Days   Peripheral IV 09/30/22 20 G 1.88" Left Antecubital 09/30/22  2320  Antecubital  1   Gastrostomy/Enterostomy Gastrostomy 18 Fr. LUQ 05/04/19  1307  LUQ  1246   Incision - 6 Ports  Abdomen Right;Lateral Right;Medial Left;Umbilicus Mid;Upper Mid;Medial Left 08/13/19  0830  -- 1145            Labs/Imaging Results for orders placed or performed during the hospital encounter of 09/30/22 (from the past 48 hour(s))  Lactic acid, plasma     Status: None   Collection Time: 09/30/22 11:20 PM  Result Value Ref Range   Lactic Acid, Venous 0.8 0.5 - 1.9 mmol/L    Comment: Performed at [facility], [address] [place], [place] [address]  CBC with Differential     Status: Abnormal   Collection Time: 09/30/22 11:26 PM  Result Value Ref Range   WBC 7.1 4.0 - 10.5 K/uL   RBC 2.91 (L) 3.87 - 5.11 MIL/uL   Hemoglobin 8.2 (L) 12.0 - 15.0 g/dL   HCT 60.4 (L) 54.0 - 98.1 %   MCV 87.6 80.0 - 100.0 fL   MCH 28.2 26.0 - 34.0 pg   MCHC 32.2 30.0 - 36.0 g/dL   RDW 19.1 47.8 - 29.5 %   Platelets 285 150 - 400 K/uL   nRBC 0.0 0.0 - 0.2 %   Neutrophils Relative % 79 %   Neutro Abs 5.6 1.7 - 7.7 K/uL   Lymphocytes Relative 13 %   Lymphs Abs 0.9 0.7 - 4.0 K/uL   Monocytes Relative 7 %   Monocytes Absolute 0.5 0.1 - 1.0 K/uL   Eosinophils Relative 1 %   Eosinophils Absolute 0.1 0.0 - 0.5 K/uL   Basophils Relative 0 %   Basophils Absolute 0.0 0.0 - 0.1 K/uL   Immature Granulocytes 0 %   Abs Immature Granulocytes 0.01 0.00 - 0.07 K/uL    Comment: Performed at [facility], [address] [place], [place] [address]  Comprehensive metabolic panel     Status: Abnormal   Collection Time: 09/30/22 11:26 PM  Result Value Ref Range   Sodium 137 135 - 145 mmol/L   Potassium 2.7 (LL) 3.5 - 5.1 mmol/L    Comment: CRITICAL RESULT CALLED TO, READ BACK BY AND VERIFIED WITH TJK. RIVERS, RN 10/01/22 0030 BY K. DAVIS   Chloride 107 98 - 111 mmol/L   CO2 22 22 - 32 mmol/L   Glucose, Bld 90 70 - 99 mg/dL    Comment: Glucose reference range applies only to samples taken after fasting for at least 8 hours.   BUN 15 6 - 20 mg/dL   Creatinine, Ser 8.65  0.44 - 1.00 mg/dL   Calcium 9.0 8.9 - 78.4 mg/dL   Total Protein 7.7 6.5 - 8.1 g/dL   Albumin 3.7 3.5 - 5.0 g/dL   AST 11 (L) 15 - 41 U/L   ALT 11 0 - 44 U/L   Alkaline Phosphatase 77 38 - 126 U/L   Total Bilirubin <0.1 (L) 0.3 - 1.2 mg/dL   GFR, Estimated >69 >62 mL/min    Comment: (NOTE)  Calculated using the CKD-EPI Creatinine Equation (2021)    Anion gap 8 5 - 15    Comment: Performed at [facility], [address] [place], [place] [address]  Urinalysis, Routine w reflex microscopic -Urine, Clean Catch     Status: Abnormal   Collection Time: 10/01/22 12:59 AM  Result Value Ref Range   Color, Urine STRAW (A) YELLOW   APPearance CLEAR CLEAR   Specific Gravity, Urine 1.008 1.005 - 1.030   pH 7.0 5.0 - 8.0   Glucose, UA NEGATIVE NEGATIVE mg/dL   Hgb urine dipstick SMALL (A) NEGATIVE   Bilirubin Urine NEGATIVE NEGATIVE   Ketones, ur NEGATIVE NEGATIVE mg/dL   Protein, ur NEGATIVE NEGATIVE mg/dL   Nitrite NEGATIVE NEGATIVE   Leukocytes,Ua TRACE (A) NEGATIVE   RBC / HPF 0-5 0 - 5 RBC/hpf   WBC, UA 0-5 0 - 5 WBC/hpf   Bacteria, UA RARE (A) NONE SEEN   Squamous Epithelial / HPF 0-5 0 - 5 /HPF   Mucus PRESENT     Comment: Performed at [facility], [address], [place], [place] [address]   DG Ankle Complete Left  Result Date: 09/30/2022 CLINICAL DATA:  Cellulitis of the lower left leg/foot with swelling and pain EXAM: LEFT ANKLE COMPLETE - 3+ VIEW COMPARISON:  None Available. FINDINGS: Swelling about the left ankle. No acute fracture or dislocation. No radiographic evidence of osteomyelitis. Plantar calcaneal spur. Vascular calcifications. IMPRESSION: Soft tissue swelling about the ankle without acute osseous abnormality. Electronically Signed   By: Minerva Fester M.D.   On: 09/30/2022 23:53    Pending Labs Unresulted Labs (From admission, onward)     Start     Ordered   09/30/22 2309  Blood culture (routine x 2)  BLOOD CULTURE X 2,   R  (with STAT occurrences)      09/30/22 2309            Vitals/Pain Today's Vitals   09/30/22 2252 10/01/22 0041 10/01/22 0304  BP: (!) 146/90    Pulse: 96    Resp: 18    Temp: 98.5 F (36.9 C)    TempSrc: Oral    SpO2: 100%    Weight: 59 kg    Height: 5' (1.524 m)    PainSc: 8  6  Asleep    Isolation Precautions No active isolations  Medications Medications  vancomycin (VANCOCIN) IVPB 1000 mg/200 mL premix (0 mg Intravenous Stopped 10/01/22 0107)  fentaNYL (SUBLIMAZE) injection 50 mcg (50 mcg Intravenous Given 09/30/22 2335)  lactated ringers bolus 1,000 mL (1,000 mLs Intravenous New Bag/Given 10/01/22 0040)  potassium chloride SA (KLOR-CON M) CR tablet 40 mEq (40 mEq Oral Given 10/01/22 0107)  oxyCODONE-acetaminophen (PERCOCET/ROXICET) 5-325 MG per tablet 2 tablet (2 tablets Oral Given 10/01/22 0107)  potassium chloride 10 mEq in 100 mL IVPB (10 mEq Intravenous New Bag/Given 10/01/22 0202)    Mobility non-ambulatory

## 2022-10-01 NOTE — Progress Notes (Addendum)
[facility] Progress Note  Patient: Angie [name]     ZOX:[id number]  DOA: 09/30/2022   PCP: Jearld Lesch, MD       Brief hospital course: This is a 57 year old female with history of gastric bypass, fibromyalgia, migraines, hypertension, diabetes mellitus who presents to the hospital for left lower extremity erythema and swelling.  She had doxycycline leftover after cellulitis of the right leg and she began to take this at home.  However after a couple of days she did not note any improvement and therefore she presented to the hospital.  She was diagnosed with cellulitis and admitted to the hospital for IV antibiotics.  Subjective:  Continues to complain of severe pain in the leg despite 15 mg of oxycodone.  She states she is not eating because of her pain.  She has no other complaints. Assessment and Plan: Principal Problem: Cellulitis  -Has erythema and swelling from her toes up to above her shin -Status post 2 days of doxycycline at home without improvement - Continue ceftriaxone - DC vancomycin -She was started on oxycodone 15 mg every 6 hours in the ED which she states is not lasting long enough-will change to every 4 hours as needed and will not increase narcotics further - Have added Tylenol 1000 mg 4 times daily and ibuprofen 4 times daily for her pain  Active Problems:   History of gastric bypass   DM (diabetes mellitus), type 2 (HCC) -Takes an oral medication at home-she cannot recall the name - Will place on sliding scale insulin here  Hypertension - Holding atenolol/HCTZ secondary to hypotension  Hypokalemia - Continue to replace and follow  Anemia, normocytic - Hemoglobin 7.5-currently asymptomatic     Code Status: Full Code Consultants: None Level of Care: Level of care: Med-Surg Total time on patient care: 5 minutes DVT prophylaxis: Heparin  Objective:   Vitals:   10/01/22 0300 10/01/22 0402 10/01/22 0730 10/01/22 1243  BP: 111/75 123/79  117/75 130/75  Pulse: 73 70 65 71  Resp: 20 16 15 16   Temp:  98.1 F (36.7 C) 98 F (36.7 C) 98.2 F (36.8 C)  TempSrc:  Oral Oral Oral  SpO2: 100% 100% 100% 100%  Weight:      Height:       Filed Weights   09/30/22 2252  Weight: 59 kg   Exam: General exam: Appears comfortable  HEENT: oral mucosa moist Respiratory system: Clear to auscultation.  Cardiovascular system: S1 & S2 heard  Gastrointestinal system: Abdomen soft, non-tender, nondistended. Normal bowel sounds   Extremities: No cyanosis, clubbing-erythema swelling and tenderness from below the knee down to toes Psychiatry:  Mood & affect appropriate.      CBC: Recent Labs  Lab 09/30/22 2326 10/01/22 0333  WBC 7.1 5.9  NEUTROABS 5.6  --   HGB 8.2* 7.5*  HCT 25.5* 23.0*  MCV 87.6 87.5  PLT 285 232   Basic Metabolic Panel: Recent Labs  Lab 09/30/22 2326 10/01/22 0333  NA 137 138  K 2.7* 3.4*  CL 107 109  CO2 22 22  GLUCOSE 90 59*  BUN 15 12  CREATININE 0.86 0.75  CALCIUM 9.0 8.4*   GFR: Estimated Creatinine Clearance: 63.8 mL/min (by C-G formula based on SCr of 0.75 mg/dL).  Scheduled Meds:  acetaminophen  1,000 mg Oral TID   dicyclomine  20 mg Oral BID   heparin  5,000 Units Subcutaneous Q8H   ibuprofen  400 mg Oral QID   Continuous Infusions:  cefTRIAXone (ROCEPHIN)  IV 1 g (10/01/22 0416)   vancomycin     Imaging and lab data was personally reviewed DG Ankle Complete Left  Result Date: 09/30/2022 CLINICAL DATA:  Cellulitis of the lower left leg/foot with swelling and pain EXAM: LEFT ANKLE COMPLETE - 3+ VIEW COMPARISON:  None Available. FINDINGS: Swelling about the left ankle. No acute fracture or dislocation. No radiographic evidence of osteomyelitis. Plantar calcaneal spur. Vascular calcifications. IMPRESSION: Soft tissue swelling about the ankle without acute osseous abnormality. Electronically Signed   By: Minerva Fester M.D.   On: 09/30/2022 23:53    LOS: 0 days   Author: Ladell Heads  Nalaysia Manganiello  10/01/2022 1:11 PM  To contact [facility]>   Check the care team in [facility] and look for the attending/consulting [facility] provider listed  Log into [email or web address] and use [facility] universal password   Go to> "[facility]"  and find provider  If you still have difficulty reaching the provider, please [place] the [facility] (Director on Call) for the Hospitalists listed on amion

## 2022-10-02 DIAGNOSIS — A419 Sepsis, unspecified organism: Secondary | ICD-10-CM | POA: Diagnosis not present

## 2022-10-02 DIAGNOSIS — L0390 Cellulitis, unspecified: Secondary | ICD-10-CM | POA: Diagnosis not present

## 2022-10-02 LAB — BASIC METABOLIC PANEL
Anion gap: 11 (ref 5–15)
BUN: 11 mg/dL (ref 6–20)
CO2: 20 mmol/L — ABNORMAL LOW (ref 22–32)
Calcium: 8.2 mg/dL — ABNORMAL LOW (ref 8.9–10.3)
Chloride: 107 mmol/L (ref 98–111)
Creatinine, Ser: 0.97 mg/dL (ref 0.44–1.00)
GFR, Estimated: >60 mL/min (ref >60)
Glucose, Bld: 89 mg/dL (ref 70–99)
Potassium: 3.7 mmol/L (ref 3.5–5.1)
Sodium: 138 mmol/L (ref 135–145)

## 2022-10-02 LAB — IRON AND TIBC
Iron: 66 ug/dL (ref 28–170)
Saturation Ratios: 21 % (ref 10.4–31.8)
TIBC: 311 ug/dL (ref 250–450)
UIBC: 245 ug/dL

## 2022-10-02 LAB — CBC
HCT: 24.1 % — ABNORMAL LOW (ref 36.0–46.0)
Hemoglobin: 7.7 g/dL — ABNORMAL LOW (ref 12.0–15.0)
MCH: 28.3 pg (ref 26.0–34.0)
MCHC: 32 g/dL (ref 30.0–36.0)
MCV: 88.6 fL (ref 80.0–100.0)
Platelets: 279 10*3/uL (ref 150–400)
RBC: 2.72 MIL/uL — ABNORMAL LOW (ref 3.87–5.11)
RDW: 14.4 % (ref 11.5–15.5)
WBC: 3.9 10*3/uL — ABNORMAL LOW (ref 4.0–10.5)
nRBC: 0 % (ref 0.0–0.2)

## 2022-10-02 LAB — RETICULOCYTES
Immature Retic Fract: 11.3 % (ref 2.3–15.9)
RBC.: 3.48 MIL/uL — ABNORMAL LOW (ref 3.87–5.11)
Retic Count, Absolute: 19.5 10*3/uL (ref 19.0–186.0)
Retic Ct Pct: 0.6 % (ref 0.4–3.1)

## 2022-10-02 LAB — GLUCOSE, CAPILLARY
Glucose-Capillary: 84 mg/dL (ref 70–99)
Glucose-Capillary: 102 mg/dL — ABNORMAL HIGH (ref 70–99)
Glucose-Capillary: 127 mg/dL — ABNORMAL HIGH (ref 70–99)
Glucose-Capillary: 86 mg/dL (ref 70–99)

## 2022-10-02 LAB — FOLATE: Folate: 6.6 ng/mL (ref >5.9)

## 2022-10-02 LAB — FERRITIN: Ferritin: 85 ng/mL (ref 11–307)

## 2022-10-02 LAB — VITAMIN B12: Vitamin B-12: 310 pg/mL (ref 180–914)

## 2022-10-02 NOTE — Progress Notes (Signed)
Cab voucher provided by SW. Cab company notified. Cab voucher given to pt's daughter.
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
## 2022-10-02 NOTE — Progress Notes (Signed)
[facility] Progress Note  Patient: Angie [name]     ZOX:[id number]  DOA: 09/30/2022   PCP: Jearld Lesch, MD       Brief hospital course: This is a 57 year old female with history of gastric bypass, fibromyalgia, migraines, hypertension, diabetes mellitus who presents to the hospital for left lower extremity erythema and swelling.  She had doxycycline leftover after cellulitis of the right leg and she began to take this at home.  However after a couple of days she did not note any improvement and therefore she presented to the hospital.  She was diagnosed with cellulitis and admitted to the hospital for IV antibiotics.  Subjective:  She has ongoing pain in her left foot and leg.  Oral intake has improved. Assessment and Plan: Principal Problem: Cellulitis  -  erythema and swelling from her toes up to above her shin -Status post 2 days of doxycycline at home without improvement - Continue ceftriaxone - DC'd vancomycin on 6/7 -Pain has improved with around-the-clock Tylenol, ibuprofen and as needed oxycodone - Although swelling has improved, erythema remains unchanged and therefore we will continue IV antibiotics  Active Problems:    DM (diabetes mellitus), type 2 (HCC) -Takes an oral medication at home-she cannot recall the name -  on sliding scale insulin here  Hypertension - Holding atenolol/HCTZ secondary to hypotension  Hypokalemia - Continue to replace and follow  Anemia, normocytic - Hemoglobin 7.5-currently asymptomatic -Obtain anemia panel-noted to have a low folate level back in 2021 but currently not on any replacement    History of gastric bypass     Code Status: Full Code Consultants: None Level of Care: Level of care: Med-Surg Total time on patient care: 25 minutes DVT prophylaxis: Heparin  Objective:   Vitals:   10/01/22 1243 10/01/22 1613 10/01/22 1920 10/02/22 0433  BP: 130/75 124/85 114/68 127/84  Pulse: 71 67 79 100  Resp: 16 16 16  17   Temp: 98.2 F (36.8 C) 98.7 F (37.1 C) 98.4 F (36.9 C) 98.7 F (37.1 C)  TempSrc: Oral Oral Oral   SpO2: 100% 100% 100% 100%  Weight:      Height:       Filed Weights   09/30/22 2252  Weight: 59 kg   Exam: General exam: Appears comfortable  HEENT: oral mucosa moist Respiratory system: Clear to auscultation.  Cardiovascular system: S1 & S2 heard  Gastrointestinal system: Abdomen soft, non-tender, nondistended. Normal bowel sounds   Extremities: No cyanosis, clubbing -persistent erythema of left lower extremity with tenderness Psychiatry:  Mood & affect appropriate.       CBC: Recent Labs  Lab 09/30/22 2326 10/01/22 0333 10/02/22 0634  WBC 7.1 5.9 3.9*  NEUTROABS 5.6  --   --   HGB 8.2* 7.5* 7.7*  HCT 25.5* 23.0* 24.1*  MCV 87.6 87.5 88.6  PLT 285 232 279    Basic Metabolic Panel: Recent Labs  Lab 09/30/22 2326 10/01/22 0333 10/02/22 0634  NA 137 138 138  K 2.7* 3.4* 3.7  CL 107 109 107  CO2 22 22 20*  GLUCOSE 90 59* 89  BUN 15 12 11   CREATININE 0.86 0.75 0.97  CALCIUM 9.0 8.4* 8.2*    GFR: Estimated Creatinine Clearance: 52.7 mL/min (by C-G formula based on SCr of 0.97 mg/dL).  Scheduled Meds:  acetaminophen  1,000 mg Oral TID   dicyclomine  20 mg Oral BID   heparin  5,000 Units Subcutaneous Q8H   ibuprofen  400 mg Oral QID  insulin aspart  0-15 Units Subcutaneous TID WC   QUEtiapine  100 mg Oral QHS   Continuous Infusions:  cefTRIAXone (ROCEPHIN)  IV Stopped (10/02/22 1610)   Imaging and lab data was personally reviewed VAS [place] LOWER EXTREMITY VENOUS (DVT) (ONLY [facility] & [facility])  Result Date: 10/02/2022  Lower Venous DVT Study Patient Name:  Angie [name]  Date of Exam:   10/01/2022 Medical Rec #: [id number]        Accession #:    [id number] Date of Birth: 1965/09/19        Patient Gender: F Patient Age:   66 years Exam Location:  [facility] Procedure:      VAS [place] LOWER EXTREMITY VENOUS (DVT) Referring Phys: Glynn Octave  --------------------------------------------------------------------------------  Indications: Swelling, cellulitis, erythema, pain.  Limitations: Inability to tolerate compressions. Comparison Study: No previous study. Performing Technologist: McKayla Maag RVT, [place]  Examination Guidelines: A complete evaluation includes B-mode imaging, spectral Doppler, color Doppler, and power Doppler as needed of all accessible portions of each vessel. Bilateral testing is considered an integral part of a complete examination. Limited examinations for reoccurring indications may be performed as noted. The reflux portion of the exam is performed with the patient in reverse Trendelenburg.  +-----+---------------+---------+-----------+----------+--------------+ RIGHTCompressibilityPhasicitySpontaneityPropertiesThrombus Aging +-----+---------------+---------+-----------+----------+--------------+ CFV  Full           Yes      Yes                                 +-----+---------------+---------+-----------+----------+--------------+ SFJ  Full                                                        +-----+---------------+---------+-----------+----------+--------------+   +--------+---------------+---------+-----------+----------+--------------------+ LEFT    CompressibilityPhasicitySpontaneityPropertiesThrombus Aging       +--------+---------------+---------+-----------+----------+--------------------+ CFV     Full           Yes      Yes                                       +--------+---------------+---------+-----------+----------+--------------------+ SFJ     Full                                                              +--------+---------------+---------+-----------+----------+--------------------+ FV Prox Full                                                              +--------+---------------+---------+-----------+----------+--------------------+ FV Mid                 Yes       Yes                  patent by color  doppler              +--------+---------------+---------+-----------+----------+--------------------+ FV                     Yes      Yes                  patent by color      Distal                                               doppler              +--------+---------------+---------+-----------+----------+--------------------+ PFV     Full                                                              +--------+---------------+---------+-----------+----------+--------------------+ POP     Full           Yes      Yes                                       +--------+---------------+---------+-----------+----------+--------------------+ PTV                                                  patent by color                                                           doppler              +--------+---------------+---------+-----------+----------+--------------------+ PERO                                                 patent by color                                                           doppler              +--------+---------------+---------+-----------+----------+--------------------+     Summary: RIGHT: - No evidence of common femoral vein obstruction.  LEFT: - There is no evidence of deep vein thrombosis in the lower extremity. However, portions of this examination were limited- see technologist comments above.  - A cystic structure is found in the popliteal fossa.  *See table(s) above for measurements and observations. Electronically signed by Gerarda Fraction on 10/02/2022 at 9:36:49 AM.    Final    DG Ankle Complete Left  Result Date: 09/30/2022 CLINICAL DATA:  Cellulitis of  the lower left leg/foot with swelling and pain EXAM: LEFT ANKLE COMPLETE - 3+ VIEW COMPARISON:  None Available. FINDINGS: Swelling about the left ankle. No acute fracture or  dislocation. No radiographic evidence of osteomyelitis. Plantar calcaneal spur. Vascular calcifications. IMPRESSION: Soft tissue swelling about the ankle without acute osseous abnormality. Electronically Signed   By: Minerva Fester M.D.   On: 09/30/2022 23:53    LOS: 1 day   Author: Calvert Cantor  10/02/2022 1:17 PM  To contact [facility]>   Check the care team in [facility] and look for the attending/consulting [facility] provider listed  Log into [email or web address] and use [place]'s universal password   Go to> "[facility]"  and find provider  If you still have difficulty reaching the provider, please Vallie the [facility] (Director on Call) for the Hospitalists listed on amion

## 2022-10-03 DIAGNOSIS — L0390 Cellulitis, unspecified: Secondary | ICD-10-CM | POA: Diagnosis not present

## 2022-10-03 DIAGNOSIS — A419 Sepsis, unspecified organism: Secondary | ICD-10-CM | POA: Diagnosis not present

## 2022-10-03 LAB — GLUCOSE, CAPILLARY
Glucose-Capillary: 79 mg/dL (ref 70–99)
Glucose-Capillary: 111 mg/dL — ABNORMAL HIGH (ref 70–99)

## 2022-10-03 LAB — CBC
HCT: 27 % — ABNORMAL LOW (ref 36.0–46.0)
Hemoglobin: 8.4 g/dL — ABNORMAL LOW (ref 12.0–15.0)
MCH: 28 pg (ref 26.0–34.0)
MCHC: 31.1 g/dL (ref 30.0–36.0)
MCV: 90 fL (ref 80.0–100.0)
Platelets: 337 10*3/uL (ref 150–400)
RBC: 3 MIL/uL — ABNORMAL LOW (ref 3.87–5.11)
RDW: 14.6 % (ref 11.5–15.5)
WBC: 4.8 10*3/uL (ref 4.0–10.5)
nRBC: 0 % (ref 0.0–0.2)

## 2022-10-03 MED ORDER — ACETAMINOPHEN 500 MG PO TABS: 1000.0000 mg | ORAL_TABLET | Freq: Three times a day (TID) | ORAL | 0 refills | Status: DC

## 2022-10-03 MED ORDER — OXYCODONE HCL 5 MG PO TABS: 5.0000 mg | ORAL_TABLET | Freq: Four times a day (QID) | ORAL | 0 refills | Status: DC | PRN

## 2022-10-03 MED ORDER — CEFUROXIME AXETIL 500 MG PO TABS: 500.0000 mg | ORAL_TABLET | Freq: Two times a day (BID) | ORAL | 0 refills | Status: AC

## 2022-10-03 MED ORDER — OXYCODONE HCL 5 MG PO TABS: 5.0000 mg | ORAL_TABLET | Freq: Four times a day (QID) | ORAL | 0 refills | Status: AC | PRN

## 2022-10-03 MED ORDER — IBUPROFEN 400 MG PO TABS: 400.0000 mg | ORAL_TABLET | Freq: Four times a day (QID) | ORAL | 0 refills | Status: AC

## 2022-10-03 MED ORDER — CEFUROXIME AXETIL 500 MG PO TABS: 500.0000 mg | ORAL_TABLET | Freq: Two times a day (BID) | ORAL | 0 refills | Status: DC

## 2022-10-03 MED ORDER — IBUPROFEN 400 MG PO TABS: 400.0000 mg | ORAL_TABLET | Freq: Four times a day (QID) | ORAL | 0 refills | Status: DC

## 2022-10-03 MED ORDER — ACETAMINOPHEN 500 MG PO TABS: 1000.0000 mg | ORAL_TABLET | Freq: Three times a day (TID) | ORAL | 0 refills | Status: AC

## 2022-10-03 NOTE — Discharge Summary (Signed)
Physician Discharge Summary  Angie [name] ZOX:[id number] DOB: 1966-01-14 DOA: 09/30/2022  PCP: Jearld Lesch, MD  Admit date: 09/30/2022 Discharge date: 10/03/2022 Discharging to: home     Discharge Diagnoses:   Brief hospital course: This is a 57 year old female with history of gastric bypass, fibromyalgia, migraines, hypertension, diabetes mellitus who presents to the hospital for left lower extremity erythema and swelling.  She had doxycycline leftover after cellulitis of the right leg and she began to take this at home.  However after a couple of days she did not note any improvement and therefore she presented to the hospital.  She was diagnosed with cellulitis and admitted to the hospital for IV antibiotics.   Subjective:  She has ongoing pain in her left foot and leg.  Oral intake has improved. Assessment and Plan: Principal Problem: Cellulitis  -  erythema and swelling from her toes up to above her shin -Status post 2 days of doxycycline at home without improvement - Continue ceftriaxone - DC'd vancomycin on 6/7 -Pain has improved with around-the-clock Tylenol, ibuprofen and as needed oxycodone - Although swelling has improved, erythema remains unchanged and therefore we will continue IV antibiotics   Active Problems:     DM (diabetes mellitus), type 2 (HCC) -Takes an oral medication at home-she cannot recall the name Hemoglobin A1C    Component Value Date/Time   HGBA1C 5.6 10/01/2022 0333      Hypertension - Held atenolol/HCTZ secondary to hypotension while hospitalized   Hypokalemia - replaced   Anemia, normocytic - Hemoglobin 7.5> 8.4currently asymptomatic -Obtain anemia panel-noted to have a low folate level back in 2021 but currently not on any replacement - rechecked folate and found to be normal     History of gastric bypass             Discharge Instructions  Discharge Instructions     Increase activity slowly   Complete by: As directed        Allergies as of 10/03/2022       Reactions   Iron Anaphylaxis, Itching   Had acute allergy with tachycardia, attention and generalized itching shortly after receiving nulecit ( iron gluconate) infusion. Had acute allergy with tachycardia, attention and generalized itching shortly after receiving nulecit ( iron gluconate) infusion.  Had acute allergy with tachycardia, attention and generalized itching shortly after receiving nulecit ( iron gluconate) infusion. Had acute allergy with tachycardia, attention and generalized itching shortly after receiving nulecit ( iron gluconate) infusion., Had acute allergy with tachycardia, attention and generalized itching shortly after receiving nulecit ( iron gluconate) infusion.   Lactose Other (See Comments)   Lactose Intolerance (gi) Other (See Comments)        Medication List     STOP taking these medications    liver oil-zinc oxide 40 % ointment Commonly known as: DESITIN       TAKE these medications    acetaminophen 500 MG tablet Commonly known as: TYLENOL Take 2 tablets (1,000 mg total) by mouth 3 (three) times daily for 5 days.   atenolol-chlorthalidone 50-25 MG tablet Commonly known as: TENORETIC Take 1 tablet by mouth daily.   blood glucose meter kit and supplies Dispense based on patient and insurance preference. Use up to four times daily as directed. (FOR ICD-10 E10.9, E11.9).   Calcium 200 MG Tabs Take 1 tablet by mouth daily.   cefUROXime 500 MG tablet Commonly known as: CEFTIN Take 1 tablet (500 mg total) by mouth 2 (two) times daily with  a meal for 7 days.   cholecalciferol 25 MCG (1000 UNIT) tablet Commonly known as: VITAMIN D3 Take 1,000 Units by mouth daily.   dicyclomine 20 MG tablet Commonly known as: BENTYL Take 20 mg by mouth 2 (two) times daily.   ibuprofen 400 MG tablet Commonly known as: ADVIL Take 1 tablet (400 mg total) by mouth 4 (four) times daily for 3 days.   magnesium 30 MG  tablet Take 30 mg by mouth 2 (two) times daily.   naloxone 4 MG/0.1ML Liqd nasal spray kit Commonly known as: NARCAN Place 1 spray into the nose once.   oxyCODONE 5 MG immediate release tablet Commonly known as: Oxy IR/ROXICODONE Take 1 tablet (5 mg total) by mouth every 6 (six) hours as needed for up to 5 days for moderate pain. What changed:  medication strength how much to take reasons to take this   Potassium 99 MG Tabs Take 1 tablet by mouth in the morning and at bedtime.   QUEtiapine 100 MG tablet Commonly known as: SEROQUEL Take 100 mg by mouth at bedtime.   VITAMIN B-12 PO Place 2 mLs under the tongue daily.        Follow-up Information     [facility]. Call.   Why: Can call for free legal advice and assistance Contact information: [address],  [place], [place] [address]  Phone: [phone]                   The results of significant diagnostics from this hospitalization (including imaging, microbiology, ancillary and laboratory) are listed below for reference.    VAS [place] LOWER EXTREMITY VENOUS (DVT) (ONLY [facility] & [facility])  Result Date: 10/02/2022  Lower Venous DVT Study Patient Name:  Angie [name]  Date of Exam:   10/01/2022 Medical Rec #: [id number]        Accession #:    [id number] Date of Birth: 07/03/1965        Patient Gender: F Patient Age:   62 years Exam Location:  [facility] Procedure:      VAS [place] LOWER EXTREMITY VENOUS (DVT) Referring Phys: Glynn Octave --------------------------------------------------------------------------------  Indications: Swelling, cellulitis, erythema, pain.  Limitations: Inability to tolerate compressions. Comparison Study: No previous study. Performing Technologist: McKayla Maag RVT, [place]  Examination Guidelines: A complete evaluation includes B-mode imaging, spectral Doppler, color Doppler, and power Doppler as needed of all accessible portions of each vessel. Bilateral testing is considered an  integral part of a complete examination. Limited examinations for reoccurring indications may be performed as noted. The reflux portion of the exam is performed with the patient in reverse Trendelenburg.  +-----+---------------+---------+-----------+----------+--------------+ RIGHTCompressibilityPhasicitySpontaneityPropertiesThrombus Aging +-----+---------------+---------+-----------+----------+--------------+ CFV  Full           Yes      Yes                                 +-----+---------------+---------+-----------+----------+--------------+ SFJ  Full                                                        +-----+---------------+---------+-----------+----------+--------------+   +--------+---------------+---------+-----------+----------+--------------------+ LEFT    CompressibilityPhasicitySpontaneityPropertiesThrombus Aging       +--------+---------------+---------+-----------+----------+--------------------+ CFV     Full  Yes      Yes                                       +--------+---------------+---------+-----------+----------+--------------------+ SFJ     Full                                                              +--------+---------------+---------+-----------+----------+--------------------+ FV Prox Full                                                              +--------+---------------+---------+-----------+----------+--------------------+ FV Mid                 Yes      Yes                  patent by color                                                           doppler              +--------+---------------+---------+-----------+----------+--------------------+ FV                     Yes      Yes                  patent by color      Distal                                               doppler              +--------+---------------+---------+-----------+----------+--------------------+ PFV     Full                                                               +--------+---------------+---------+-----------+----------+--------------------+ POP     Full           Yes      Yes                                       +--------+---------------+---------+-----------+----------+--------------------+ PTV                                                  patent by color  doppler              +--------+---------------+---------+-----------+----------+--------------------+ PERO                                                 patent by color                                                           doppler              +--------+---------------+---------+-----------+----------+--------------------+     Summary: RIGHT: - No evidence of common femoral vein obstruction.  LEFT: - There is no evidence of deep vein thrombosis in the lower extremity. However, portions of this examination were limited- see technologist comments above.  - A cystic structure is found in the popliteal fossa.  *See table(s) above for measurements and observations. Electronically signed by Gerarda Fraction on 10/02/2022 at 9:36:49 AM.    Final    DG Ankle Complete Left  Result Date: 09/30/2022 CLINICAL DATA:  Cellulitis of the lower left leg/foot with swelling and pain EXAM: LEFT ANKLE COMPLETE - 3+ VIEW COMPARISON:  None Available. FINDINGS: Swelling about the left ankle. No acute fracture or dislocation. No radiographic evidence of osteomyelitis. Plantar calcaneal spur. Vascular calcifications. IMPRESSION: Soft tissue swelling about the ankle without acute osseous abnormality. Electronically Signed   By: Minerva Fester M.D.   On: 09/30/2022 23:53   Labs:   Basic Metabolic Panel: Recent Labs  Lab 09/30/22 2326 10/01/22 0333 10/02/22 0634  NA 137 138 138  K 2.7* 3.4* 3.7  CL 107 109 107  CO2 22 22 20*  GLUCOSE 90 59* 89  BUN 15 12 11   CREATININE 0.86 0.75  0.97  CALCIUM 9.0 8.4* 8.2*     CBC: Recent Labs  Lab 09/30/22 2326 10/01/22 0333 10/02/22 0634 10/03/22 0612  WBC 7.1 5.9 3.9* 4.8  NEUTROABS 5.6  --   --   --   HGB 8.2* 7.5* 7.7* 8.4*  HCT 25.5* 23.0* 24.1* 27.0*  MCV 87.6 87.5 88.6 90.0  PLT 285 232 279 337         SIGNED:   Calvert Cantor, MD  [facility] 10/03/2022, 8:55 AM

## 2022-10-06 LAB — CULTURE, BLOOD (ROUTINE X 2)
Culture: NO GROWTH
Culture: NO GROWTH
Special Requests: ADEQUATE

## 2022-11-18 ENCOUNTER — Emergency Department (HOSPITAL_COMMUNITY)
Admission: EM | Admit: 2022-11-18 | Discharge: 2022-11-18 | Disposition: A | Discharge status: Home / Self Care | Payer: Medicare HMO | Attending: Emergency Medicine | Admitting: Emergency Medicine

## 2022-11-18 ENCOUNTER — Encounter (HOSPITAL_COMMUNITY): Payer: Self-pay

## 2022-11-18 ENCOUNTER — Other Ambulatory Visit: Payer: Self-pay

## 2022-11-18 DIAGNOSIS — S0006XA Insect bite (nonvenomous) of scalp, initial encounter: Secondary | ICD-10-CM | POA: Insufficient documentation

## 2022-11-18 DIAGNOSIS — W57XXXA Bitten or stung by nonvenomous insect and other nonvenomous arthropods, initial encounter: Secondary | ICD-10-CM | POA: Insufficient documentation

## 2022-11-18 MED ORDER — DOXYCYCLINE HYCLATE 100 MG PO CAPS: 100.0000 mg | ORAL_CAPSULE | Freq: Two times a day (BID) | ORAL | 0 refills | Status: DC

## 2022-11-18 MED ORDER — DOXYCYCLINE HYCLATE 100 MG PO TABS
100.0000 mg | ORAL_TABLET | Freq: Once | ORAL | Status: AC
  Administered 2022-11-18: 100 mg via ORAL
  Filled 2022-11-18: qty 1

## 2022-11-18 NOTE — ED Provider Notes (Signed)
EMERGENCY DEPARTMENT AT [facility] Provider Note   CSN: [id number] Arrival date & time: 11/18/22  1316     History  Chief Complaint  Patient presents with   Insect Bite    Angie [name] is a 57 y.o. female.  Angie [name] is a 57 y.o. female who presents today with concern for tick bite.  She reports that she thinks she was bitten about 5 days ago.  She reports that there was 1 tach located on the back of her scalp just above her neck.  She he was able to remove the tick 2 days ago but reports she still had some swelling and discomfort in the local area.  She reports some bodyaches, mild headache and subjective fever, although afebrile on arrival here today for the past 2 days since removing the tag.  She has not noted any rashes or skin changes.  No abdominal pain, nausea vomiting or diarrhea, no other symptoms reported.  She has not been on any medication for treatment.  The history is provided by the patient.       Home Medications Prior to Admission medications   Medication Sig Start Date End Date Taking? Authorizing Provider  doxycycline (VIBRAMYCIN) 100 MG capsule Take 1 capsule (100 mg total) by mouth 2 (two) times daily. One po bid x 7 days 11/18/22  Yes Dartha Lodge, PA-C  atenolol-chlorthalidone (TENORETIC) 50-25 MG tablet Take 1 tablet by mouth daily.    [provider]  blood glucose meter kit and supplies Dispense based on patient and insurance preference. Use up to four times daily as directed. (FOR ICD-10 E10.9, E11.9). 05/18/19   Burnadette Pop, MD  Calcium 200 MG TABS Take 1 tablet by mouth daily.    [provider]  cholecalciferol (VITAMIN D3) 25 MCG (1000 UNIT) tablet Take 1,000 Units by mouth daily.    [provider]  Cyanocobalamin (VITAMIN B-12 PO) Place 2 mLs under the tongue daily.    [provider]  dicyclomine (BENTYL) 20 MG tablet Take 20 mg by mouth 2 (two) times daily. 05/10/22   [provider]  magnesium 30 MG tablet Take 30 mg by mouth 2 (two) times daily.    [provider]  naloxone [facility]) nasal spray 4 mg/0.1 mL Place 1 spray into the nose once. 06/11/20   [provider]  Potassium 99 MG TABS Take 1 tablet by mouth in the morning and at bedtime.    [provider]  QUEtiapine (SEROQUEL) 100 MG tablet Take 100 mg by mouth at bedtime.    [provider]      Allergies    Iron, Lactose, and Lactose intolerance (gi)    Review of Systems   Review of Systems  Constitutional:  Positive for chills and fever.  Musculoskeletal:  Positive for myalgias.  Neurological:  Positive for headaches.    Physical Exam Updated Vital Signs BP (!) 160/107   Pulse 97   Temp 98 F (36.7 C) (Oral)   Resp 18   Ht 5' (1.524 m)   Wt 59 kg   SpO2 100%   BMI 25.40 kg/m  Physical Exam Vitals and nursing note reviewed.  Constitutional:      General: She is not in acute distress.    Appearance: Normal appearance. She is well-developed. She is not ill-appearing or diaphoretic.  HENT:     Head: Normocephalic and atraumatic.     Comments: Posterior scalp examined, small  raised area with scab where tick was reportedly present, no remaining portion of the tick noted on exam, no fluctuance or purulent drainage    Mouth/Throat:     Mouth: Mucous membranes are moist.     Pharynx: Oropharynx is clear.  Eyes:     General:        Right eye: No discharge.        Left eye: No discharge.  Cardiovascular:     Rate and Rhythm: Normal rate and regular rhythm.  Pulmonary:     Effort: Pulmonary effort is normal. No respiratory distress.     Breath sounds: Normal breath sounds.  Abdominal:     Palpations: Abdomen is soft.     Tenderness: There is no abdominal tenderness.  Musculoskeletal:        General: No swelling or deformity.     Cervical back: Neck supple. No rigidity.  Lymphadenopathy:     Cervical: No cervical adenopathy.  Skin:     Findings: No rash.  Neurological:     Mental Status: She is alert and oriented to person, place, and time.     Coordination: Coordination normal.  Psychiatric:        Mood and Affect: Mood normal.        Behavior: Behavior normal.     ED Results / Procedures / Treatments   Labs (all labs ordered are listed, but only abnormal results are displayed) Labs Reviewed - No data to display  EKG None  Radiology No results found.  Procedures Procedures    Medications Ordered in ED Medications  doxycycline (VIBRA-TABS) tablet 100 mg (has no administration in time range)    ED Course/ Medical Decision Making/ A&P                             Medical Decision Making Risk Prescription drug management.   57 year old female presents with concern for tick bite, the tick was already successfully removed by the patient, she reports some generalized myalgias, headache and subjective fever, vitals are stable on arrival and patient is well-appearing, no rash.  Will treat with doxycycline to cover for tickborne illness, stressed the importance of completing entire course of this antibiotic, discussed return precautions.  Discharged home in good condition.        Final Clinical Impression(s) / ED Diagnoses Final diagnoses:  Tick bite of scalp, initial encounter    Rx / DC Orders ED Discharge Orders          Ordered    doxycycline (VIBRAMYCIN) 100 MG capsule  2 times daily        11/18/22 1515              Jodi Geralds [place], [place] 12/09/22 1610    Lorre Nick, MD 12/13/22 1448

## 2022-11-18 NOTE — ED Triage Notes (Signed)
Patient states she was bitten by a tick that embedded into her skin on the back of her head/neck about 5 days ago. She got the tick out 2 days ago, endorses body aches, headache and fevers for 2 days. Patient states she feels like something is still in there. Ambulatory to triage A&Ox4.

## 2022-11-18 NOTE — Discharge Instructions (Signed)
Take doxycycline twice daily for 7 days to treat for potential tick borne illness

## 2023-12-07 ENCOUNTER — Ambulatory Visit (HOSPITAL_COMMUNITY)
Admission: EM | Admit: 2023-12-07 | Discharge: 2023-12-07 | Disposition: A | Discharge status: Home / Self Care | Attending: Nurse Practitioner | Admitting: Nurse Practitioner

## 2023-12-07 ENCOUNTER — Inpatient Hospital Stay
Admission: RE | Admit: 2023-12-07 | Discharge: 2023-12-13 | DRG: 881 | Disposition: A | Discharge status: Home / Self Care | Source: Intra-hospital | Attending: Psychiatry | Admitting: Psychiatry

## 2023-12-07 DIAGNOSIS — Z9152 Personal history of nonsuicidal self-harm: Secondary | ICD-10-CM

## 2023-12-07 DIAGNOSIS — Z87891 Personal history of nicotine dependence: Secondary | ICD-10-CM

## 2023-12-07 DIAGNOSIS — R45851 Suicidal ideations: Secondary | ICD-10-CM | POA: Insufficient documentation

## 2023-12-07 DIAGNOSIS — G894 Chronic pain syndrome: Secondary | ICD-10-CM | POA: Diagnosis present

## 2023-12-07 DIAGNOSIS — Z635 Disruption of family by separation and divorce: Secondary | ICD-10-CM

## 2023-12-07 DIAGNOSIS — Z8249 Family history of ischemic heart disease and other diseases of the circulatory system: Secondary | ICD-10-CM | POA: Diagnosis not present

## 2023-12-07 DIAGNOSIS — F411 Generalized anxiety disorder: Secondary | ICD-10-CM | POA: Diagnosis not present

## 2023-12-07 DIAGNOSIS — F1010 Alcohol abuse, uncomplicated: Secondary | ICD-10-CM | POA: Diagnosis present

## 2023-12-07 DIAGNOSIS — Z9151 Personal history of suicidal behavior: Secondary | ICD-10-CM

## 2023-12-07 DIAGNOSIS — G4700 Insomnia, unspecified: Secondary | ICD-10-CM | POA: Diagnosis present

## 2023-12-07 DIAGNOSIS — Z818 Family history of other mental and behavioral disorders: Secondary | ICD-10-CM | POA: Diagnosis not present

## 2023-12-07 DIAGNOSIS — E739 Lactose intolerance, unspecified: Secondary | ICD-10-CM | POA: Diagnosis present

## 2023-12-07 DIAGNOSIS — Z79899 Other long term (current) drug therapy: Secondary | ICD-10-CM | POA: Diagnosis not present

## 2023-12-07 DIAGNOSIS — M797 Fibromyalgia: Secondary | ICD-10-CM | POA: Diagnosis present

## 2023-12-07 DIAGNOSIS — F419 Anxiety disorder, unspecified: Secondary | ICD-10-CM | POA: Diagnosis present

## 2023-12-07 DIAGNOSIS — Z800 Family history of malignant neoplasm of digestive organs: Secondary | ICD-10-CM | POA: Diagnosis not present

## 2023-12-07 DIAGNOSIS — F1110 Opioid abuse, uncomplicated: Secondary | ICD-10-CM | POA: Diagnosis present

## 2023-12-07 DIAGNOSIS — F4310 Post-traumatic stress disorder, unspecified: Secondary | ICD-10-CM | POA: Diagnosis present

## 2023-12-07 DIAGNOSIS — F332 Major depressive disorder, recurrent severe without psychotic features: Secondary | ICD-10-CM | POA: Diagnosis present

## 2023-12-07 DIAGNOSIS — F329 Major depressive disorder, single episode, unspecified: Secondary | ICD-10-CM | POA: Diagnosis present

## 2023-12-07 DIAGNOSIS — E119 Type 2 diabetes mellitus without complications: Secondary | ICD-10-CM | POA: Diagnosis present

## 2023-12-07 DIAGNOSIS — I10 Essential (primary) hypertension: Secondary | ICD-10-CM | POA: Diagnosis present

## 2023-12-07 DIAGNOSIS — Z9884 Bariatric surgery status: Secondary | ICD-10-CM | POA: Diagnosis not present

## 2023-12-07 DIAGNOSIS — R45850 Homicidal ideations: Secondary | ICD-10-CM | POA: Diagnosis present

## 2023-12-07 DIAGNOSIS — Z833 Family history of diabetes mellitus: Secondary | ICD-10-CM | POA: Diagnosis not present

## 2023-12-07 DIAGNOSIS — F209 Schizophrenia, unspecified: Secondary | ICD-10-CM | POA: Diagnosis present

## 2023-12-07 LAB — URINALYSIS, ROUTINE W REFLEX MICROSCOPIC
Bacteria, UA: NONE SEEN
Bilirubin Urine: NEGATIVE
Glucose, UA: NEGATIVE mg/dL
Ketones, ur: NEGATIVE mg/dL
Nitrite: NEGATIVE
Protein, ur: NEGATIVE mg/dL
Specific Gravity, Urine: 1.005 (ref 1.005–1.030)
pH: 7 (ref 5.0–8.0)

## 2023-12-07 LAB — COMPREHENSIVE METABOLIC PANEL WITH GFR
ALT: 11 U/L (ref 0–44)
AST: 14 U/L — ABNORMAL LOW (ref 15–41)
Albumin: 4.1 g/dL (ref 3.5–5.0)
Alkaline Phosphatase: 86 U/L (ref 38–126)
Anion gap: 10 (ref 5–15)
BUN: 16 mg/dL (ref 6–20)
CO2: 23 mmol/L (ref 22–32)
Calcium: 9.6 mg/dL (ref 8.9–10.3)
Chloride: 103 mmol/L (ref 98–111)
Creatinine, Ser: 0.96 mg/dL (ref 0.44–1.00)
GFR, Estimated: >60 mL/min (ref >60)
Glucose, Bld: 110 mg/dL — ABNORMAL HIGH (ref 70–99)
Potassium: 3.7 mmol/L (ref 3.5–5.1)
Sodium: 136 mmol/L (ref 135–145)
Total Bilirubin: 0.3 mg/dL (ref 0.0–1.2)
Total Protein: 7.6 g/dL (ref 6.5–8.1)

## 2023-12-07 LAB — LIPID PANEL
Cholesterol: 168 mg/dL (ref 0–200)
HDL: 69 mg/dL (ref >40)
LDL Cholesterol: 62 mg/dL (ref 0–99)
Total CHOL/HDL Ratio: 2.4 ratio
Triglycerides: 187 mg/dL — ABNORMAL HIGH (ref <150)
VLDL: 37 mg/dL (ref 0–40)

## 2023-12-07 LAB — POC URINE PREG, ED: Preg Test, Ur: NEGATIVE

## 2023-12-07 LAB — POCT URINE DRUG SCREEN - MANUAL ENTRY (I-SCREEN)
POC Amphetamine UR: POSITIVE — AB
POC Buprenorphine (BUP): NOT DETECTED
POC Cocaine UR: NOT DETECTED
POC Marijuana UR: POSITIVE — AB
POC Methadone UR: NOT DETECTED
POC Methamphetamine UR: POSITIVE — AB
POC Morphine: NOT DETECTED
POC Oxazepam (BZO): NOT DETECTED
POC Oxycodone UR: NOT DETECTED
POC Secobarbital (BAR): NOT DETECTED

## 2023-12-07 LAB — GLUCOSE, CAPILLARY
Glucose-Capillary: 74 mg/dL (ref 70–99)
Glucose-Capillary: 70 mg/dL (ref 70–99)

## 2023-12-07 LAB — ETHANOL: Alcohol, Ethyl (B): <15 mg/dL (ref <15)

## 2023-12-07 LAB — MAGNESIUM: Magnesium: 2 mg/dL (ref 1.7–2.4)

## 2023-12-07 LAB — SARS CORONAVIRUS 2 BY RT PCR: SARS Coronavirus 2 by RT PCR: NEGATIVE

## 2023-12-07 MED ORDER — ALUM & MAG HYDROXIDE-SIMETH 200-200-20 MG/5ML PO SUSP: 30.0000 mL | ORAL | Status: DC | PRN

## 2023-12-07 MED ORDER — DIPHENHYDRAMINE HCL 25 MG PO CAPS: 50.0000 mg | ORAL_CAPSULE | Freq: Three times a day (TID) | ORAL | Status: DC | PRN

## 2023-12-07 MED ORDER — HALOPERIDOL LACTATE 5 MG/ML IJ SOLN: 10.0000 mg | Freq: Three times a day (TID) | INTRAMUSCULAR | Status: DC | PRN

## 2023-12-07 MED ORDER — OLANZAPINE 5 MG PO TBDP: 5.0000 mg | ORAL_TABLET | Freq: Three times a day (TID) | ORAL | Status: DC | PRN

## 2023-12-07 MED ORDER — HALOPERIDOL LACTATE 5 MG/ML IJ SOLN: 5.0000 mg | Freq: Three times a day (TID) | INTRAMUSCULAR | Status: DC | PRN

## 2023-12-07 MED ORDER — TRAZODONE HCL 50 MG PO TABS: 50.0000 mg | ORAL_TABLET | Freq: Every evening | ORAL | Status: DC | PRN

## 2023-12-07 MED ORDER — DIPHENHYDRAMINE HCL 50 MG/ML IJ SOLN: 50.0000 mg | Freq: Three times a day (TID) | INTRAMUSCULAR | Status: DC | PRN

## 2023-12-07 MED ORDER — LORAZEPAM 2 MG/ML IJ SOLN: 2.0000 mg | Freq: Three times a day (TID) | INTRAMUSCULAR | Status: DC | PRN

## 2023-12-07 MED ORDER — MAGNESIUM HYDROXIDE 400 MG/5ML PO SUSP: 30.0000 mL | Freq: Every day | ORAL | Status: DC | PRN

## 2023-12-07 MED ORDER — HALOPERIDOL 5 MG PO TABS: 5.0000 mg | ORAL_TABLET | Freq: Three times a day (TID) | ORAL | Status: DC | PRN

## 2023-12-07 MED ORDER — INSULIN ASPART 100 UNIT/ML IJ SOLN: 0.0000 [IU] | Freq: Three times a day (TID) | INTRAMUSCULAR | Status: DC

## 2023-12-07 MED ORDER — HYDROXYZINE HCL 25 MG PO TABS: 25.0000 mg | ORAL_TABLET | Freq: Three times a day (TID) | ORAL | Status: DC | PRN

## 2023-12-07 MED ORDER — ACETAMINOPHEN 325 MG PO TABS: 650.0000 mg | ORAL_TABLET | Freq: Four times a day (QID) | ORAL | Status: DC | PRN
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
## 2023-12-07 NOTE — Progress Notes (Signed)
 Pt's CBG was 74mg /dl. Pt was asymptomatic. Nkwenti, NP was notified. Pt was given orange juice and was advised to eat dinner provided. CBG recheck was 130mg /dl. Will monitor until the end of my shift.

## 2023-12-07 NOTE — Progress Notes (Signed)
 Pt was accepted to [facility] on 12/07/2023  Bed Assignment: L31   Address: [address] [place], [place], [place] [address]  Pt meets inpatient criteria per Donia Snell, NP  Attending Physician will be Dr. Allyn Donnelly COME  Report can be called to: [phone]  Pt can arrive pending labs   Care Team notified:[facility] Cherylynn Ernst, RN, Donia Snell, NP, Dorla Jung, RN, Damien Fireman, RN

## 2023-12-07 NOTE — ED Notes (Signed)
 Safe Cabin crew.

## 2023-12-07 NOTE — ED Notes (Signed)
 Pt A&O x 4, sleeping at present, no distress noted. Calm & cooperative.  Monitoring  for safety  Pending [facility] transfer tonight.

## 2023-12-07 NOTE — ED Notes (Signed)
 Report called to RN Cooper, [facility].  Librarian, academic.

## 2023-12-07 NOTE — ED Provider Notes (Signed)
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 samples taken after fasting for at least 8 hours.   Comment 1 10/03/2022 Notify RN   Final   Glucose-Capillary 10/03/2022 111 (  H)  70 - 99 mg/dL Final   Glucose reference range applies only to samples taken after  fasting for at least 8 hours.   Comment 1 10/03/2022 Notify RN   Final    Allergies: Iron , Lactose intolerance (gi), and Other  Medications:  Facility Ordered Medications  Medication   acetaminophen  (TYLENOL ) tablet 650 mg   alum & mag hydroxide-simeth (MAALOX/MYLANTA) 200-200-20 MG/5ML suspension 30 mL   magnesium  hydroxide (MILK OF MAGNESIA) suspension 30 mL   OLANZapine  zydis (ZYPREXA ) disintegrating tablet 5 mg   hydrOXYzine  (ATARAX ) tablet 25 mg   traZODone  (DESYREL ) tablet 50 mg   PTA Medications  Medication Sig   Potassium 99 MG TABS Take 99 mg by mouth daily.   magnesium  30 MG tablet Take 30 mg by mouth 2 (two) times daily.   naloxone  (NARCAN ) nasal spray 4 mg/0.1 mL Place 1 spray into the nose Once PRN (For opioid overdose). (Patient not taking: Reported on 12/07/2023)   QUEtiapine  (SEROQUEL ) 100 MG tablet Take 100 mg by mouth at bedtime. (Patient not taking: Reported on 12/07/2023)    Medical Decision Making  -Ordered baseline labs including hemoglobin A1c, CMP, CBC, lipid panel, iron  TIBC, vitamin D , B12, TSH. EKG. could be testing ordered. -Glucerna tid for nutritional supplementation -Agitation protocol  medications as per the [facility]  Recommendations  Patient has been accepted for voluntary transfer to the [facility] Psychiatric unit for treatment and stabilization of mental status.  Donia Snell, NP 12/07/23  5:17 PM
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
## 2023-12-08 ENCOUNTER — Encounter: Payer: Self-pay | Admitting: Psychiatry

## 2023-12-08 ENCOUNTER — Other Ambulatory Visit: Payer: Self-pay

## 2023-12-08 DIAGNOSIS — F329 Major depressive disorder, single episode, unspecified: Secondary | ICD-10-CM | POA: Diagnosis not present

## 2023-12-08 DIAGNOSIS — F209 Schizophrenia, unspecified: Secondary | ICD-10-CM | POA: Diagnosis present

## 2023-12-08 LAB — GLUCOSE, CAPILLARY
Glucose-Capillary: 130 mg/dL — ABNORMAL HIGH (ref 70–99)
Glucose-Capillary: 145 mg/dL — ABNORMAL HIGH (ref 70–99)

## 2023-12-08 MED ORDER — DIPHENHYDRAMINE HCL 50 MG/ML IJ SOLN: 50.0000 mg | Freq: Three times a day (TID) | INTRAMUSCULAR | Status: DC | PRN

## 2023-12-08 MED ORDER — ADULT MULTIVITAMIN W/MINERALS CH
1.0000 | ORAL_TABLET | Freq: Every day | ORAL | Status: DC
  Administered 2023-12-08 – 2023-12-12 (×5): 1 via ORAL
  Filled 2023-12-08 (×5): qty 1

## 2023-12-08 MED ORDER — HALOPERIDOL 5 MG PO TABS: 5.0000 mg | ORAL_TABLET | Freq: Three times a day (TID) | ORAL | Status: DC | PRN

## 2023-12-08 MED ORDER — LORAZEPAM 2 MG/ML IJ SOLN: 2.0000 mg | Freq: Three times a day (TID) | INTRAMUSCULAR | Status: DC | PRN

## 2023-12-08 MED ORDER — HYDROXYZINE HCL 25 MG PO TABS
25.0000 mg | ORAL_TABLET | Freq: Three times a day (TID) | ORAL | Status: DC | PRN
  Administered 2023-12-08 – 2023-12-09 (×3): 25 mg via ORAL
  Filled 2023-12-08 (×3): qty 1

## 2023-12-08 MED ORDER — HALOPERIDOL LACTATE 5 MG/ML IJ SOLN: 10.0000 mg | Freq: Three times a day (TID) | INTRAMUSCULAR | Status: DC | PRN

## 2023-12-08 MED ORDER — DIPHENHYDRAMINE HCL 25 MG PO CAPS: 50.0000 mg | ORAL_CAPSULE | Freq: Three times a day (TID) | ORAL | Status: DC | PRN

## 2023-12-08 MED ORDER — ALUM & MAG HYDROXIDE-SIMETH 200-200-20 MG/5ML PO SUSP: 30.0000 mL | ORAL | Status: DC | PRN

## 2023-12-08 MED ORDER — ENSURE PLUS HIGH PROTEIN PO LIQD: 237.0000 mL | Freq: Two times a day (BID) | ORAL | Status: DC

## 2023-12-08 MED ORDER — TRAZODONE HCL 50 MG PO TABS
50.0000 mg | ORAL_TABLET | Freq: Every evening | ORAL | Status: DC | PRN
  Administered 2023-12-08: 50 mg via ORAL
  Filled 2023-12-08: qty 1

## 2023-12-08 MED ORDER — ACETAMINOPHEN 325 MG PO TABS
650.0000 mg | ORAL_TABLET | Freq: Four times a day (QID) | ORAL | Status: DC | PRN
  Administered 2023-12-10: 650 mg via ORAL
  Filled 2023-12-08 (×2): qty 2

## 2023-12-08 MED ORDER — HALOPERIDOL LACTATE 5 MG/ML IJ SOLN: 5.0000 mg | Freq: Three times a day (TID) | INTRAMUSCULAR | Status: DC | PRN

## 2023-12-08 MED ORDER — MAGNESIUM HYDROXIDE 400 MG/5ML PO SUSP: 30.0000 mL | Freq: Every day | ORAL | Status: DC | PRN

## 2023-12-08 NOTE — Group Note (Unsigned)
 Date:  12/08/2023 Time:  10:51 AM  Group Topic/Focus:  Dimensions of Wellness:   The focus of this group is to introduce the topic of wellness and discuss the role each dimension of wellness plays in total health.     Participation Level:  {BHH PARTICIPATION OZCZO:77735}  Participation Quality:  {BHH PARTICIPATION QUALITY:22265}  Affect:  {BHH AFFECT:22266}  Cognitive:  {BHH COGNITIVE:22267}  Insight: {BHH Insight2:20797}  Engagement in Group:  {BHH ENGAGEMENT IN HMNLE:77731}  Modes of Intervention:  {BHH MODES OF INTERVENTION:22269}  Additional Comments:  ***  Harlene LITTIE Gavel 12/08/2023, 10:51 AM

## 2023-12-08 NOTE — Progress Notes (Addendum)
 Pt admitted from [facility] under voluntary consent. Pt 58 y/o female admitted for SI/HI, depression and anxiety. Report received from [employer], RN prior to admission. Pt arrived via safe transport. Pt AOx4. Patient has past medical Hx of MDD, GAD, PTSD, opioid use disorder, alcohol use disorder in remission, 2 past suicidal ideation, HTN and osteoarthritis. Pt calm and pleasant during assessment/admission. Pt requesting to use the bathroom on arrival. Writer explained to pt admission process and she would be able go to her room for the bathroom shortly after being searched. Pt agrees with this. VSS. Pt ABCs intact. RR even and unlabored. Pt in NAD. Pt ambulatory. Pt gait is steady denies any use of assistive devices at home. Pt states she has a hx of falls due to her knees giving out walker provided.  Pt reports she is here because she is SI and HI. Pt reports to Clinical research associate that CPS took my son because, someone called the cops saying that he was home alone and hungry. I was just mad and said I was going to kill everyone and then myself because, I am nothing without my son. Pt endorses SI/HI. Pt stating I will shoot myself and set the complex on fire. Denies AVH. Pt verbally contracts for safety with this Clinical research associate. Anxiety 8/10. Depression 9/10.   Skin was assessed x2 staff members present see assessments no acute findings. During skin check pt had paper scrubs, grip socks on and personal underwear. Writer seen a what appeared as a hair clip on the right side of pts underwear clipped to underwear during skin check. Pt then searched and asked to change out into new paper scrubs, underwear and socks. Pt cooperative with changed out but, noted to ask writer I have to change my underwear too? Pt noted to have a wig on and wig was searched and writer found 2 bobby pins in pts wig. Bobby pins removed from wig and stored in pts belongings bag. Pt asked writer oh I cannot have those? Reviewed protocol for allowed items and  safety on the unit. Pt given education, support, and encouragement to be active in his treatment plan. Pt educated and given suicide safety plan, alcohol education and falls protocol sheet.   Pt oriented to the unit and her room. CBG 70 pt given snack repeat CBG 145. POC and unit policies explained and understanding verbalized. Patient had no additional questions or concerns. Pt being monitored Q 15 minutes for safety per unit protocol, pt remains safe on the unit. POC.   Personal belongings bags searched by MHT. MHT reports to Clinical research associate that she found a plastic bag with crystal white substance in it that was wrapped in one of the pts socks. Security was called. [facility] AC notified by Clinical research associate and [facility] notified by security of findings. Security and RN transported this contraband to the ED charge nurse for disposal. This Clinical research associate did not witness a disposal of contraband. Safety zone completed by MHT. Provider made aware. MHT reported to writer that a snap hair clip was on the R side of pts personal underwear that pt had on arrival to unit. While MHT was washing pt clothing items a tooth pick was found in one of the clothing items. Pt not given any of her belonging other than her glasses at this time until evaluated by provider.

## 2023-12-08 NOTE — Progress Notes (Signed)
 NUTRITION ASSESSMENT  Pt identified as at risk on the Malnutrition Screen Tool  INTERVENTION:  -Continue regular diet -Ensure Plus High Protein po TID, each supplement provides 350 kcal and 20 grams of protein  -MVI with minerals daily  NUTRITION DIAGNOSIS: Unintentional weight loss related to sub-optimal intake as evidenced by pt report.   Goal: Pt to meet >/= 90% of their estimated nutrition needs.  Monitor:  PO intake  Assessment:  Pt with PMH of of MDD, GAD, PTSD, opioid use disorder, alcohol use disorder in remission, 2 past suicidal ideation, HTN and osteoarthritis. Admitted for SI/HI, depression and anxiety.    Pt currently on a regular diet. No meal completion data available to assess at this time.   Reviewed wt hx; pt has experienced a 18.4% wt loss over the past 4 months, which is significant for time frame. Highly suspect pt with malnutrition, however, unable to identify at this time. Pt would greatly benefit from addition of oral nutrition supplements.   Pt underwent gastric bypass surgery in 2007. Noted that per RD note in 2021 pt underwent reversal and had g-tube removed. Pt was 132# on 08/29/19.  Medications reviewed.   Lab Results  Component Value Date   HGBA1C 5.6 10/01/2022   PTA DM medications are none.   Labs reviewed: CBGS: 70-145 (inpatient orders for glycemic control are none). Tox screen positive for amphetamines, methamphetamines, marijuana, and cocaine.   58 y.o. female  Height: Ht Readings from Last 1 Encounters:  12/07/23 5' (1.524 m)    Weight: Wt Readings from Last 1 Encounters:  12/07/23 51.5 kg    Weight Hx: Wt Readings from Last 10 Encounters:  12/07/23 51.5 kg  11/18/22 59 kg  09/30/22 59 kg  08/11/22 62.6 kg  08/03/22 61.7 kg  05/04/22 72.1 kg  01/14/21 74.4 kg  10/13/20 70.8 kg  06/10/20 63.5 kg  05/16/20 63.5 kg    BMI:  Body mass index is 22.17 kg/m. Pt meets criteria for none based on current BMI.  Estimated  Nutritional Needs: Kcal: 25-30 kcal/kg Protein: > 1 gram protein/kg Fluid: 1 ml/kcal  Diet Order:  Diet Order             Diet regular Room service appropriate? Yes; Fluid consistency: Thin  Diet effective now                  Pt is also offered choice of unit snacks mid-morning and mid-afternoon.  Pt is eating as desired.   Lab results and medications reviewed.   Margery ORN, RD, LDN, CDCES Registered Dietitian III Certified Diabetes Care and Education Specialist If unable to reach this RD, please use RD Inpatient group chat on secure chat between hours of 8am-4 pm daily

## 2023-12-08 NOTE — [facility] Suicide Risk Assessment (Signed)
 [facility] Admission Suicide Risk Assessment   Nursing information obtained from:  Patient Demographic factors:  Divorced or widowed, Low socioeconomic status Current Mental Status:  Suicide plan, Self-harm thoughts, Thoughts of violence towards others, Plan to harm others Loss Factors:  Loss of significant relationship, Financial problems / change in socioeconomic status Historical Factors:  Prior suicide attempts Risk Reduction Factors:  Responsible for children under 67 years of age, Sense of responsibility to family, Positive social support, Positive therapeutic relationship, Positive coping skills or problem solving skills  Total Time spent with patient: 30 minutes Principal Problem: MDD (major depressive disorder) Diagnosis:  Principal Problem:   MDD (major depressive disorder) Active Problems:   Schizophrenia (HCC)  Subjective Data: Angie [name] is a 58 year old female with PMH of MDD, GAD, PTSD, bipolar disorder, MST, opioid use disorder, alcohol use disorder, chronic pain syndrome, fibromyalgia, DM, HTN, and osteoarthritis. She presents to the inpatient psych unit for suicidal ideations and homicidal ideations. Patient has a history of 2 suicide attempts in the past, one in 2006 by overdosing on Tylenol  when she was pregnant with her son the other in 2005 unknown method. She required hospitalization for both. Patient is admitted to [facility] unit with Q15 min safety monitoring. Multidisciplinary team approach is offered. Medication management; group/milieu therapy is offered.   Continued Clinical Symptoms:  Alcohol Use Disorder Identification Test Final Score (AUDIT): 2 The Alcohol Use Disorders Identification Test, Guidelines for Use in Primary Care, Second Edition.  World Science writer [facility]). Score between 0-7:  no or low risk or alcohol related problems. Score between 8-15:  moderate risk of alcohol related problems. Score between 16-19:  high risk of alcohol related  problems. Score 20 or above:  warrants further diagnostic evaluation for alcohol dependence and treatment.   CLINICAL FACTORS:   Depression:   Impulsivity   Musculoskeletal: Strength & Muscle Tone: within normal limits Gait & Station: normal Patient leans: N/A  Psychiatric Specialty Exam:  Presentation  General Appearance:  Disheveled  Eye Contact:No data recorded Speech:No data recorded Speech Volume: Normal  Handedness: Right   Mood and Affect  Mood: Anxious; Depressed  Affect: Congruent   Thought Process  Thought Processes: Coherent  Descriptions of Associations:Intact  Orientation:Full (Time, Place and Person)  Thought Content:Illogical  History of Schizophrenia/Schizoaffective disorder:No  Duration of Psychotic Symptoms:No data recorded Hallucinations:Hallucinations: None  Ideas of Reference:Paranoia; Delusions  Suicidal Thoughts:Suicidal Thoughts: Yes, Active SI Active Intent and/or Plan: With Plan  Homicidal Thoughts:Homicidal Thoughts: Yes, Active HI Active Intent and/or Plan: With Plan   Sensorium  Memory: Immediate Poor  Judgment:No data recorded Insight: Poor   Executive Functions  Concentration: Fair  Attention Span: Fair  Recall: [employer] of Knowledge: Poor  Language: Fair   Psychomotor Activity  Psychomotor Activity: Psychomotor Activity: Normal   Assets  Assets: Resilience   Sleep  Sleep: Sleep: Poor    Physical Exam: Physical Exam Vitals and nursing note reviewed.    ROS Blood pressure 127/80, pulse 73, temperature 98.4 F (36.9 C), resp. rate 13, height 5' (1.524 m), weight 51.5 kg, SpO2 100%. Body mass index is 22.17 kg/m.   COGNITIVE FEATURES THAT CONTRIBUTE TO RISK:  None    SUICIDE RISK:   Minimal: No identifiable suicidal ideation.  Patients presenting with no risk factors but with morbid ruminations; may be classified as minimal risk based on the severity of the depressive  symptoms  PLAN OF CARE: Patient is admitted to [facility] psych unit with Q15 min safety  monitoring. Multidisciplinary team approach is offered. Medication management; group/milieu therapy is offered.   I certify that inpatient services furnished can reasonably be expected to improve the patient's condition.   Allyn Foil, MD 12/08/2023, 10:20 PM

## 2023-12-08 NOTE — Plan of Care (Signed)
  Problem: Activity: Goal: Interest or engagement in leisure activities will improve Outcome: Progressing Goal: Imbalance in normal sleep/wake cycle will improve Outcome: Progressing   Problem: Coping: Goal: Coping ability will improve Outcome: Progressing Goal: Will verbalize feelings Outcome: Progressing   

## 2023-12-08 NOTE — Progress Notes (Signed)
   12/07/23 2215  Psych Admission Type (Psych Patients Only)  Admission Status Voluntary  Psychosocial Assessment  Patient Complaints Self-harm thoughts;Depression  Eye Contact Brief  Facial Expression Flat;Blank  Affect Appropriate to circumstance  Speech Logical/coherent  Interaction Assertive  Motor Activity Slow  Appearance/Hygiene In scrubs  Behavior Characteristics Cooperative;Appropriate to situation;Guarded  Mood Depressed;Preoccupied;Anxious  Thought Process  Coherency WDL  Content Blaming others  Delusions None reported or observed  Perception WDL  Hallucination None reported or observed  Judgment Poor  Confusion None  Danger to Self  Current suicidal ideation? Verbalizes;Plan  Description of Suicide Plan SI plan to shoot self  Self-Injurious Behavior No self-injurious ideation or behavior indicators observed or expressed   Agreement Not to Harm Self Yes  Description of Agreement verbal  Danger to Others  Danger to Others Reported or observed (HI towards the people in my complex, I wanted to set everything on fire the kill myself because, they took my son.)  Danger to Others Abnormal  Harmful Behavior to others No threats or harm toward other people  Destructive Behavior No threats or harm toward property

## 2023-12-08 NOTE — Tx Team (Signed)
 Initial Treatment Plan 12/07/2023 10:30 PM Rylie Limburg Alkhatib FMW:[id number]    PATIENT STRESSORS: Financial difficulties   Marital or family conflict   Medication change or noncompliance   Substance abuse     PATIENT STRENGTHS: Ability for insight  Active sense of humor  Communication skills  Motivation for treatment/growth    PATIENT IDENTIFIED PROBLEMS: SI  HI  Depression  Anxiety  Substance use              DISCHARGE CRITERIA:  Ability to meet basic life and health needs Adequate post-discharge living arrangements Improved stabilization in mood, thinking, and/or behavior Medical problems require only outpatient monitoring Motivation to continue treatment in a less acute level of care Need for constant or close observation no longer present Reduction of life-threatening or endangering symptoms to within safe limits Safe-care adequate arrangements made Verbal commitment to aftercare and medication compliance Withdrawal symptoms are absent or subacute and managed without 24-hour nursing intervention  PRELIMINARY DISCHARGE PLAN: Outpatient therapy Return to previous living arrangement  PATIENT/FAMILY INVOLVEMENT: This treatment plan has been presented to and reviewed with the patient, Keilin D Leet. The patient and family have been given the opportunity to ask questions and make suggestions.  Steward LITTIE Bracket, RN 12/07/2023, 10:30 PM

## 2023-12-08 NOTE — Group Note (Signed)
 Recreation Therapy Group Note   Group Topic:Relaxation  Group Date: 12/08/2023 Start Time: 1400 End Time: 1500 Facilitators: Celestia Jeoffrey BRAVO, LRT, CTRS Location: Courtyard  Group Description: Music. Patients encouraged to name their favorite song(s) for LRT to play song through speaker for group to hear, while in the courtyard getting fresh air and sunlight. Patients educated on the definition of leisure and the importance of having different leisure interests outside of the hospital. Group discussed how leisure activities can often be used as Pharmacologist and that listening to music and being outside are examples.    Goal Area(s) Addressed:  Patient will identify a current leisure interest.  Patient will practice making a positive decision. Patient will have the opportunity to try a new leisure activity.   Affect/Mood: N/A   Participation Level: Did not attend    Clinical Observations/Individualized Feedback: Patient did not attend group.   Plan: Continue to engage patient in RT group sessions 2-3x/week.   Jeoffrey BRAVO Celestia, LRT, CTRS 12/08/2023 5:34 PM
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
## 2023-12-08 NOTE — Plan of Care (Signed)
 Pt new admit no time to progress. Problem: Education: Goal: Utilization of techniques to improve thought processes will improve Outcome: Not Progressing Goal: Knowledge of the prescribed therapeutic regimen will improve Outcome: Not Progressing   Problem: Activity: Goal: Interest or engagement in leisure activities will improve Outcome: Not Progressing Goal: Imbalance in normal sleep/wake cycle will improve Outcome: Not Progressing   Problem: Coping: Goal: Coping ability will improve Outcome: Not Progressing Goal: Will verbalize feelings Outcome: Not Progressing   Problem: Health Behavior/Discharge Planning: Goal: Ability to make decisions will improve Outcome: Not Progressing Goal: Compliance with therapeutic regimen will improve Outcome: Not Progressing   Problem: Role Relationship: Goal: Will demonstrate positive changes in social behaviors and relationships Outcome: Not Progressing   Problem: Safety: Goal: Ability to disclose and discuss suicidal ideas will improve Outcome: Not Progressing Goal: Ability to identify and utilize support systems that promote safety will improve Outcome: Not Progressing   Problem: Self-Concept: Goal: Will verbalize positive feelings about self Outcome: Not Progressing Goal: Level of anxiety will decrease Outcome: Not Progressing   Problem: Education: Goal: Knowledge of [place] [facility] Education information/materials will improve Outcome: Not Progressing Goal: Emotional status will improve Outcome: Not Progressing Goal: Mental status will improve Outcome: Not Progressing Goal: Verbalization of understanding the information provided will improve Outcome: Not Progressing   Problem: Activity: Goal: Interest or engagement in activities will improve Outcome: Not Progressing Goal: Sleeping patterns will improve Outcome: Not Progressing   Problem: Coping: Goal: Ability to verbalize frustrations and anger appropriately will  improve Outcome: Not Progressing Goal: Ability to demonstrate self-control will improve Outcome: Not Progressing   Problem: Health Behavior/Discharge Planning: Goal: Identification of resources available to assist in meeting health care needs will improve Outcome: Not Progressing Goal: Compliance with treatment plan for underlying cause of condition will improve Outcome: Not Progressing   Problem: Physical Regulation: Goal: Ability to maintain clinical measurements within normal limits will improve Outcome: Not Progressing   Problem: Safety: Goal: Periods of time without injury will increase Outcome: Not Progressing   Problem: Education: Goal: Ability to make informed decisions regarding treatment will improve Outcome: Not Progressing   Problem: Coping: Goal: Coping ability will improve Outcome: Not Progressing   Problem: Health Behavior/Discharge Planning: Goal: Identification of resources available to assist in meeting health care needs will improve Outcome: Not Progressing   Problem: Medication: Goal: Compliance with prescribed medication regimen will improve Outcome: Not Progressing   Problem: Self-Concept: Goal: Ability to disclose and discuss suicidal ideas will improve Outcome: Not Progressing Goal: Will verbalize positive feelings about self Outcome: Not Progressing Note: Patient is initiating therapy. Patient will be evaluated at upcoming provider appointment to assess progress

## 2023-12-09 DIAGNOSIS — F329 Major depressive disorder, single episode, unspecified: Secondary | ICD-10-CM | POA: Diagnosis not present

## 2023-12-09 DIAGNOSIS — F209 Schizophrenia, unspecified: Secondary | ICD-10-CM | POA: Diagnosis not present

## 2023-12-09 MED ORDER — DICYCLOMINE HCL 20 MG PO TABS: 20.0000 mg | ORAL_TABLET | Freq: Four times a day (QID) | ORAL | Status: DC | PRN

## 2023-12-09 MED ORDER — NAPROXEN 500 MG PO TABS: 500.0000 mg | ORAL_TABLET | Freq: Two times a day (BID) | ORAL | Status: DC | PRN

## 2023-12-09 MED ORDER — METHOCARBAMOL 500 MG PO TABS
500.0000 mg | ORAL_TABLET | Freq: Three times a day (TID) | ORAL | Status: DC | PRN
Start: 2023-12-09 — End: 2023-12-14

## 2023-12-09 MED ORDER — CLONIDINE HCL 0.1 MG PO TABS
0.1000 mg | ORAL_TABLET | Freq: Four times a day (QID) | ORAL | Status: AC
  Administered 2023-12-09 – 2023-12-11 (×7): 0.1 mg via ORAL
  Filled 2023-12-09 (×7): qty 1

## 2023-12-09 MED ORDER — ONDANSETRON 4 MG PO TBDP
4.0000 mg | ORAL_TABLET | Freq: Four times a day (QID) | ORAL | Status: DC | PRN
  Administered 2023-12-11: 4 mg via ORAL
  Filled 2023-12-09: qty 1

## 2023-12-09 MED ORDER — CLONIDINE HCL 0.1 MG PO TABS: 0.1000 mg | ORAL_TABLET | Freq: Every day | ORAL | Status: DC

## 2023-12-09 MED ORDER — DULOXETINE HCL 30 MG PO CPEP
30.0000 mg | ORAL_CAPSULE | Freq: Two times a day (BID) | ORAL | Status: DC
  Administered 2023-12-09 – 2023-12-12 (×5): 30 mg via ORAL
  Filled 2023-12-09 (×9): qty 1

## 2023-12-09 MED ORDER — LOPERAMIDE HCL 2 MG PO CAPS: 2.0000 mg | ORAL_CAPSULE | ORAL | Status: DC | PRN

## 2023-12-09 MED ORDER — TRAZODONE HCL 100 MG PO TABS
100.0000 mg | ORAL_TABLET | Freq: Every evening | ORAL | Status: DC | PRN
  Administered 2023-12-10 – 2023-12-13 (×4): 100 mg via ORAL
  Filled 2023-12-09 (×5): qty 1

## 2023-12-09 MED ORDER — CLONIDINE HCL 0.1 MG PO TABS
0.1000 mg | ORAL_TABLET | ORAL | Status: DC
  Administered 2023-12-11: 0.1 mg via ORAL
  Filled 2023-12-09: qty 1

## 2023-12-09 MED ORDER — LIDOCAINE 5 % EX PTCH
1.0000 | MEDICATED_PATCH | CUTANEOUS | Status: DC
  Administered 2023-12-09 – 2023-12-12 (×4): 1 via TRANSDERMAL
  Filled 2023-12-09 (×4): qty 1

## 2023-12-09 MED ORDER — CARMEX CLASSIC LIP BALM EX OINT
TOPICAL_OINTMENT | CUTANEOUS | Status: DC | PRN
  Administered 2023-12-10 – 2023-12-11 (×2): 1 via TOPICAL
  Filled 2023-12-09: qty 10

## 2023-12-09 MED ORDER — HYDROXYZINE HCL 50 MG PO TABS
50.0000 mg | ORAL_TABLET | Freq: Three times a day (TID) | ORAL | Status: DC | PRN
  Administered 2023-12-10: 50 mg via ORAL
  Filled 2023-12-09 (×2): qty 1

## 2023-12-09 NOTE — Group Note (Signed)
 Date:  12/09/2023 Time:  10:37 PM  Group Topic/Focus:  Self Care:   The focus of this group is to help patients understand the importance of self-care in order to improve or restore emotional, physical, spiritual, interpersonal, and financial health. MHT went over sleep hygiene along with rules and expectations for our unit while also identifying who the nurses and techs are.    Participation Level:  Minimal  Participation Quality:  Appropriate  Affect:  Appropriate  Cognitive:  Appropriate  Insight: Appropriate  Engagement in Group:  Engaged  Modes of Intervention:  Discussion  Additional Comments:    Leigh VEAR Pais 12/09/2023, 10:37 PM

## 2023-12-09 NOTE — Group Note (Signed)
 Physical/Occupational Therapy Group Note  Group Topic: UE Therex   Group Date: 12/09/2023 Start Time: 1300 End Time: 1350 Facilitators: Clive Warren CROME, OT     Group Description: Group instructed in series of upper extremities exercises, aimed to promote strength, flexibility, range of motion and functional endurance.  Patients provided cuing for proper mechanics and proper pace of exercise; exercises adjusted as necessary for individualized patient needs.  Patient also engaged in cognitive components throughout session, working to integrate attention to task, command following, turn-taking and appropriate social interaction throughout session.  Allowed to ask questions as appropriate, and encouraged to identify specific exercises that they could complete independently outside of group sessions.   Therapeutic Goal(s): Demonstrate appropriate performance of upper extremity exercises to promote strength, flexibility, range of motion and functional endurance Identify 2-3 specific upper extremity exercises to complete as home exercise program outside of group session   Individual Participation: Did not attend.   Participation Level: Did not attend   Participation Quality:   Behavior:   Speech/Thought Process:   Affect/Mood:   Insight:   Judgement:   Modes of Intervention:   Patient Response to Interventions:    Plan: Continue to engage patient in PT/OT groups 1 - 2x/week.  Uilani Sanville R., MPH, MS, OTR/L ascom [phone] 12/09/23, 5:16 PM

## 2023-12-09 NOTE — Plan of Care (Signed)
   Problem: Activity: Goal: Interest or engagement in leisure activities will improve Outcome: Progressing Goal: Imbalance in normal sleep/wake cycle will improve Outcome: Progressing

## 2023-12-09 NOTE — Plan of Care (Signed)
  Problem: Education: Goal: Utilization of techniques to improve thought processes will improve Outcome: Progressing Goal: Knowledge of the prescribed therapeutic regimen will improve Outcome: Progressing   Problem: Activity: Goal: Interest or engagement in leisure activities will improve Outcome: Progressing Goal: Imbalance in normal sleep/wake cycle will improve Outcome: Progressing   Problem: Coping: Goal: Coping ability will improve Outcome: Progressing Goal: Will verbalize feelings Outcome: Progressing   Problem: Health Behavior/Discharge Planning: Goal: Ability to make decisions will improve Outcome: Progressing Goal: Compliance with therapeutic regimen will improve Outcome: Progressing   

## 2023-12-09 NOTE — Progress Notes (Signed)
   12/09/23 1430  Spiritual Encounters  Type of Visit Initial  Care provided to: Patient  Conversation partners present during encounter Social worker/Care management/TOC  Referral source Social worker/Care management/TOC  OnCall Visit Yes   Chaplain visited with patient per staff suggestion.  Patient was resting when Chaplain arrived and introduced herself.  Chaplain asked patient if it was a good time for a visit and patient said she'd like to continue resting.  Chaplain let patient know Chaplains are available if she had any spiritual care needs.    Rev. Rana M. Nicholaus, M.Div. Chaplain Resident [facility]

## 2023-12-09 NOTE — Group Note (Signed)
 Recreation Therapy Group Note   Group Topic:Emotion Expression  Group Date: 12/09/2023 Start Time: 1400 End Time: 1500 Facilitators: Celestia Jeoffrey BRAVO, LRT, CTRS Location: Dayroom  Group Description: Painting a Diplomatic Services operational officer. Patients and LRT discuss what it means to be "at peace", what it feels like physically and mentally. Pts are given a canvas and watercolor paint to use and encouraged to draw their idea of a peaceful place. Pts and LRT discuss how they use this in their daily life post discharge. Pts are encouraged to take their canvas home with them as a reminder to find their peaceful place whenever they are feeling depressed, anxious, etc.    Goal Area(s) Addressed:  Patient will identify what it means to experience a "peaceful" emotion. Patient will identify a new coping skill.  Patient will express their emotions through art. Patients will increase communication by talking with LRT and peers while in group.   Affect/Mood: N/A   Participation Level: Did not attend    Clinical Observations/Individualized Feedback: Patient did not attend group.   Plan: Continue to engage patient in RT group sessions 2-3x/week.   Jeoffrey BRAVO Celestia, LRT, CTRS  12/09/2023 5:06 PM

## 2023-12-09 NOTE — Progress Notes (Signed)
 Pt is alert and oriented. Pt received in bed with eyes open. RR even and unlabored. Irritable and anxious. Poor eye contact. Speech clear. States she's ready to leave says she's disappointed with the facility & services here and ready to go home. Pt voiced multiple complaints about the food  and medications says she takes oxytocin  for fibromyalgia. PRN pain medication for pain/discomfort offered but refused. PRNs given for anxiety and insomnia. Tol well. [place] sandwich snack pack offered and accepted. Pt resting quietly in bed. Q 15 min checks maintained for safety. No further complaints offered at this time.     12/08/23 2100  Psych Admission Type (Psych Patients Only)  Admission Status Voluntary  Psychosocial Assessment  Patient Complaints Anxiety;Irritability  Eye Contact Brief  Facial Expression Flat  Affect Irritable  Speech Logical/coherent  Interaction Isolative  Motor Activity Slow  Appearance/Hygiene In scrubs  Behavior Characteristics Irritable  Mood Irritable  Thought Process  Coherency WDL  Content Preoccupation  Delusions None reported or observed  Perception WDL  Hallucination None reported or observed  Judgment Impaired  Confusion None  Danger to Self  Current suicidal ideation? Denies
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
## 2023-12-09 NOTE — Progress Notes (Signed)
 [facility] MD Progress Note  12/09/2023 3:22 PM Angie [name]  MRN:  [id number]  Angie [name] is a 58 year old female with PMH of MDD, GAD, PTSD, bipolar disorder, MST, opioid use disorder, alcohol use disorder, chronic pain syndrome, fibromyalgia, DM, HTN, and osteoarthritis. She presents to the inpatient psych unit for suicidal ideations and homicidal ideations. Patient has a history of 2 suicide attempts in the past, one in 2006 by overdosing on Tylenol  when she was pregnant with her son the other in 2005 unknown method. She required hospitalization for both.  Subjective:  Chart reviewed, case discussed in multidisciplinary meeting, patient seen during rounds.  Patient met with the treatment team today.  She reports that she is doing a lot better and denies SI/HI/plan.  She is superficially engaged in the interview and reports that it is time for her to go home so that she can pay her bills.  She reports withdrawal symptoms of opiates as she was intoxicated on fentanyl , crystal meth before admission.  She is reporting cramps, nausea and diarrhea.  Provider educated patient that she is added on clonidine  detox protocol/opioid detox protocol.  Patient remains fixated on Xanax  or Klonopin helping her anxiety.  Patient was educated about polysubstance use and the complication of prescribing her any scheduled or narcotic medications.  Patient's SSRIs will be optimized.  She denies auditory/visual hallucinations.   Sleep: Poor  Appetite:  Poor  Past Psychiatric History: see h&P Family History:  Family History  Problem Relation Age of Onset   Diabetes Father    Heart disease Father    Depression Maternal Grandmother    Heart disease Maternal Grandfather    Depression Paternal Grandmother    Colon cancer Paternal Grandfather    Pancreatic cancer Paternal Grandfather    Social History:  Social History   Substance and Sexual Activity  Alcohol Use Not Currently   Alcohol/week: 17.0 standard drinks of  alcohol   Types: 6 Glasses of wine, 11 Shots of liquor per week   Comment: occ     Social History   Substance and Sexual Activity  Drug Use No    Social History   Socioeconomic History   Marital status: Widowed    Spouse name: Not on file   Number of children: 4   Years of education: Not on file   Highest education level: Not on file  Occupational History   Not on file  Tobacco Use   Smoking status: Former    Current packs/day: 0.00    Average packs/day: 0.5 packs/day for 4.0 years (2.0 ttl pk-yrs)    Types: Cigarettes    Start date: 03/16/2008    Quit date: 03/16/2012    Years since quitting: 11.7   Smokeless tobacco: Never  Vaping Use   Vaping status: Never Used  Substance and Sexual Activity   Alcohol use: Not Currently    Alcohol/week: 17.0 standard drinks of alcohol    Types: 6 Glasses of wine, 11 Shots of liquor per week    Comment: occ   Drug use: No   Sexual activity: Yes    Birth control/protection: None, Surgical  Other Topics Concern   Not on file  Social History Narrative   Not on file   Social Drivers of Health   Financial Resource Strain: Not on file  Food Insecurity: No Food Insecurity (12/08/2023)   Hunger Vital Sign    Worried About Running Out of Food in the Last Year: Never true    Ran  Out of Food in the Last Year: Never true  Transportation Needs: No Transportation Needs (12/08/2023)   PRAPARE - Administrator, Civil Service (Medical): No    Lack of Transportation (Non-Medical): No  Physical Activity: Not on file  Stress: Not on file  Social Connections: Unknown (09/08/2021)   Received from [facility]   Social Network    Social Network: Not on file   Past Medical History:  Past Medical History:  Diagnosis Date   Abnormal Pap smear    Alcohol abuse    Anemia    IDA   Anxiety    Takes xanax    Arthritis    left hip/knees, lower spine   Bilateral lower extremity edema    Bipolar disorder (HCC)    Blood transfusion  without reported diagnosis last done 04-25-17   Bulging lumbar disc    Bursitis    left shoulder   Chronic lower back pain    Cut    right middle finger with knife small cut healing pt instructed to keep clean and dry no redness or drainage   Depression    Diverticulitis    Fibromyalgia    Gastrointestinal tube present (HCC)    GERD (gastroesophageal reflux disease)    Hypertension    none since weight loss, no medications at this time   Hypoglycemia    Lactose intolerance in adult 2017   Migraines    weekly @ least (04/26/2016)   Seizures (HCC)    one Dec. 2017 due to throat closing   Type II diabetes mellitus (HCC)    before the gastric bypass (04/26/2016) states she no longer has diabetes    Past Surgical History:  Procedure Laterality Date   BALLOON DILATION N/A 05/27/2016   Procedure: BALLOON DILATION;  Surgeon: Victory LITTIE Legrand DOUGLAS, MD;  Location: [facility];  Service: Gastroenterology;  Laterality: N/A;   BALLOON DILATION N/A 04/21/2017   Procedure: BALLOON DILATION;  Surgeon: Legrand Victory LITTIE DOUGLAS, MD;  Location: [facility];  Service: Gastroenterology;  Laterality: N/A;   CERVICAL CONE BIOPSY     CESAREAN SECTION  1993; 1996; 1998; 2014   CESAREAN SECTION WITH BILATERAL TUBAL LIGATION Bilateral 10/28/2012   Procedure: Repeat cesarean section with delivery of baby boy at 1210. Apgars 8/9.  BILATERAL TUBAL LIGATION;  Surgeon: Vonn VEAR Inch, MD;  Location: [facility];  Service: Obstetrics;  Laterality: Bilateral;   DILATION AND CURETTAGE OF UTERUS     ESOPHAGOGASTRODUODENOSCOPY N/A 05/27/2016   Procedure: ESOPHAGOGASTRODUODENOSCOPY (EGD);  Surgeon: Victory LITTIE Legrand DOUGLAS, MD;  Location: [facility];  Service: Gastroenterology;  Laterality: N/A;   ESOPHAGOGASTRODUODENOSCOPY (EGD) WITH PROPOFOL  N/A 04/23/2016   Procedure: ESOPHAGOGASTRODUODENOSCOPY (EGD) WITH PROPOFOL ;  Surgeon: Victory LITTIE Legrand DOUGLAS, MD;  Location: [facility];  Service: Endoscopy;  Laterality: N/A;    ESOPHAGOGASTRODUODENOSCOPY (EGD) WITH PROPOFOL  N/A 01/03/2017   Procedure: ESOPHAGOGASTRODUODENOSCOPY (EGD) WITH PROPOFOL ;  Surgeon: Legrand Victory LITTIE DOUGLAS, MD;  Location: [facility];  Service: Gastroenterology;  Laterality: N/A;   ESOPHAGOGASTRODUODENOSCOPY (EGD) WITH PROPOFOL  N/A 04/21/2017   Procedure: ESOPHAGOGASTRODUODENOSCOPY (EGD) WITH PROPOFOL ;  Surgeon: Legrand Victory LITTIE DOUGLAS, MD;  Location: [facility] ENDOSCOPY;  Service: Gastroenterology;  Laterality: N/A;   ESOPHAGOGASTRODUODENOSCOPY (EGD) WITH PROPOFOL  N/A 05/02/2019   Procedure: ESOPHAGOGASTRODUODENOSCOPY (EGD) WITH PROPOFOL ;  Surgeon: Abran Norleen SAILOR, MD;  Location: [facility] ENDOSCOPY;  Service: Endoscopy;  Laterality: N/A;   GASTROSTOMY N/A 08/16/2016   Procedure: LAPAROSCOPIC INSERTION OF GASTROSTOMY TUBE IN REMNANT STOMACH;  Surgeon: Herlene Beverley Bureau, MD;  Location: [facility];  Service: General;  Laterality: N/A;   GASTROSTOMY N/A 05/16/2017   Procedure: Replacement of  GASTROSTOMY TUBE;  Surgeon: Stevie Herlene Righter, MD;  Location: [facility];  Service: General;  Laterality: N/A;   IR CM INJ ANY COLONIC TUBE W/FLUORO  05/15/2019   LAPAROSCOPIC INSERTION GASTROSTOMY TUBE Left 05/04/2019   Procedure: LAPAROSCOPIC INSERTION GASTROSTOMY TUBE;  Surgeon: Stevie Herlene Righter, MD;  Location: [facility];  Service: General;  Laterality: Left;   LAPAROSCOPIC REVISION OF GASTROJEJUNOSTOMY Left 05/16/2017   Procedure: LAPAROSCOPIC REVISION OF GASTROJEJUNOSTOMY;  Surgeon: Stevie Herlene Righter, MD;  Location: [facility];  Service: General;  Laterality: Left;   LAPAROSCOPIC REVISION OF ROUXENY WITH UPPER ENDOSCOPY N/A 08/13/2019   Procedure: LAPAROSCOPIC REVISION OF ROUX EN Y WITH UPPER ENDOSCOPY; REMOVAL GASTROSTOMY TUBE, TAKEDOWN OF GASTROJEJUNOSTOMY; RESECTION OF SMALL INTESTINE ERAS PATHWAY;  Surgeon: Kinsinger, Herlene Righter, MD;  Location: [facility];  Service: General;  Laterality: N/A;   ROUX-EN-Y GASTRIC BYPASS  2007   TUBAL LIGATION  2014    Current Medications: Current  Facility-Administered Medications  Medication Dose Route Frequency Provider Last Rate Last Admin   acetaminophen  (TYLENOL ) tablet 650 mg  650 mg Oral Q6H PRN McLauchlin, Angela, NP       alum & mag hydroxide-simeth (MAALOX/MYLANTA) 200-200-20 MG/5ML suspension 30 mL  30 mL Oral Q4H PRN McLauchlin, Angela, NP       cloNIDine  (CATAPRES ) tablet 0.1 mg  0.1 mg Oral QID Jaxon Flatt, MD       Followed by   NOREEN ON 12/11/2023] cloNIDine  (CATAPRES ) tablet 0.1 mg  0.1 mg Oral BH-qamhs Donnelly Mellow, MD       Followed by   NOREEN ON 12/14/2023] cloNIDine  (CATAPRES ) tablet 0.1 mg  0.1 mg Oral QAC breakfast Glendora Clouatre, MD       dicyclomine  (BENTYL ) tablet 20 mg  20 mg Oral Q6H PRN Terresa Marlett, MD       haloperidol  (HALDOL ) tablet 5 mg  5 mg Oral TID PRN McLauchlin, Angela, NP       And   diphenhydrAMINE  (BENADRYL ) capsule 50 mg  50 mg Oral TID PRN McLauchlin, Angela, NP       haloperidol  lactate (HALDOL ) injection 5 mg  5 mg Intramuscular TID PRN McLauchlin, Angela, NP       And   diphenhydrAMINE  (BENADRYL ) injection 50 mg  50 mg Intramuscular TID PRN McLauchlin, Angela, NP       And   LORazepam  (ATIVAN ) injection 2 mg  2 mg Intramuscular TID PRN McLauchlin, Jon, NP       haloperidol  lactate (HALDOL ) injection 10 mg  10 mg Intramuscular TID PRN McLauchlin, Angela, NP       And   diphenhydrAMINE  (BENADRYL ) injection 50 mg  50 mg Intramuscular TID PRN McLauchlin, Angela, NP       And   LORazepam  (ATIVAN ) injection 2 mg  2 mg Intramuscular TID PRN McLauchlin, Angela, NP       DULoxetine  (CYMBALTA ) DR capsule 30 mg  30 mg Oral BID Angelene Rome, MD   30 mg at 12/09/23 1144   feeding supplement (ENSURE PLUS HIGH PROTEIN) liquid 237 mL  237 mL Oral BID BM Antwain Caliendo, MD       hydrOXYzine  (ATARAX ) tablet 50 mg  50 mg Oral TID PRN Rhylen Shaheen, MD       lidocaine  (LIDODERM ) 5 % 1 patch  1 patch Transdermal Q24H Donnelly Mellow, MD   1 patch at 12/09/23 1144  lip balm (CARMEX)  ointment   Topical PRN Donnelly Mellow, MD       loperamide  (IMODIUM ) capsule 2-4 mg  2-4 mg Oral PRN Breanna Shorkey, MD       magnesium  hydroxide (MILK OF MAGNESIA) suspension 30 mL  30 mL Oral Daily PRN McLauchlin, Angela, NP       methocarbamol  (ROBAXIN ) tablet 500 mg  500 mg Oral Q8H PRN Jacquelinne Speak, MD       multivitamin with minerals tablet 1 tablet  1 tablet Oral Daily Sakia Schrimpf, MD   1 tablet at 12/09/23 1026   naproxen  (NAPROSYN ) tablet 500 mg  500 mg Oral BID PRN Maanya Hippert, MD       ondansetron  (ZOFRAN -ODT) disintegrating tablet 4 mg  4 mg Oral Q6H PRN Otniel Hoe, MD       traZODone  (DESYREL ) tablet 100 mg  100 mg Oral QHS PRN Analys Ryden, MD        Lab Results:  Results for orders placed or performed during the hospital encounter of 12/07/23 (from the past 48 hours)  Glucose, capillary     Status: None   Collection Time: 12/07/23 11:23 PM  Result Value Ref Range   Glucose-Capillary 70 70 - 99 mg/dL    Comment: Glucose reference range applies only to samples taken after fasting for at least 8 hours.  Glucose, capillary     Status: Abnormal   Collection Time: 12/08/23 12:25 AM  Result Value Ref Range   Glucose-Capillary 145 (H) 70 - 99 mg/dL    Comment: Glucose reference range applies only to samples taken after fasting for at least 8 hours.    Blood Alcohol level:  Lab Results  Component Value Date   [facility] <15 12/07/2023   ETH <10 05/12/2019    Metabolic Disorder Labs: Lab Results  Component Value Date   HGBA1C 5.6 10/01/2022   MPG 114.02 10/01/2022   MPG 88 08/06/2019   No results found for: PROLACTIN Lab Results  Component Value Date   CHOL 168 12/07/2023   TRIG 187 (H) 12/07/2023   HDL 69 12/07/2023   CHOLHDL 2.4 12/07/2023   VLDL 37 12/07/2023   LDLCALC 62 12/07/2023   LDLCALC  01/22/2007    47        Total Cholesterol/HDL:CHD Risk Coronary Heart Disease Risk Table                     Men   Women  1/2 Average Risk   3.4   3.3     Physical Findings: AIMS:  , ,  ,  ,    CIWA:    COWS:      Psychiatric Specialty Exam:  Presentation  General Appearance:  Disheveled  Eye Contact:No data recorded Speech:No data recorded Speech Volume: Normal    Mood and Affect  Mood: Anxious; Depressed  Affect: Congruent   Thought Process  Thought Processes: Coherent  Descriptions of Associations:Intact  Orientation:Full (Time, Place and Person)  Thought Content:Illogical  Hallucinations:No data recorded Ideas of Reference:Paranoia; Delusions  Suicidal Thoughts:No data recorded Homicidal Thoughts:No data recorded  Sensorium  Memory: Immediate Poor  Judgment:No data recorded Insight: Poor   Executive Functions  Concentration: Fair  Attention Span: Fair  Recall: Fair  Fund of Knowledge: Poor  Language: Fair   Psychomotor Activity  Psychomotor Activity:No data recorded Musculoskeletal: Strength & Muscle Tone: within normal limits Gait & Station: normal Assets  Assets: Resilience    Physical Exam: Physical Exam Vitals  and nursing note reviewed.    ROS Blood pressure (!) 151/100, pulse 80, temperature 98 F (36.7 C), resp. rate 20, height 5' (1.524 m), weight 51.5 kg, SpO2 100%. Body mass index is 22.17 kg/m.  Diagnosis: Principal Problem:   MDD (major depressive disorder) Active Problems:   Schizophrenia (HCC)   PLAN: Safety and Monitoring:  -- Voluntary admission to inpatient psychiatric unit for safety, stabilization and treatment  -- Daily contact with patient to assess and evaluate symptoms and progress in treatment  -- Patient's case to be discussed in multi-disciplinary team meeting  -- Observation Level : q15 minute checks  -- Vital signs:  q12 hours  -- Precautions: suicide, elopement, and assault -- Encouraged patient to participate in unit milieu and in scheduled group therapies  2. Psychiatric Diagnoses and Treatment:    Clonidine  detox protocol  was initiated Cymbalta  30 mg BID     3. Medical Issues Being Addressed:   Opioid use d/o  4. Discharge Planning:   -- Social work and case management to assist with discharge planning and identification of hospital follow-up needs prior to discharge  -- Estimated LOS: 3-4 days  Charleton Deyoung, MD 12/09/2023, 3:22 PM

## 2023-12-09 NOTE — Group Note (Signed)
 Date:  12/09/2023 Time:  11:39 AM  Group Topic/Focus:  Rediscovering Joy:   The focus of this group is to explore various ways to relieve stress in a positive manner.    Participation Level:  Did Not Attend   Angie [name] 12/09/2023, 11:39 AM

## 2023-12-09 NOTE — BH IP Treatment Plan (Signed)
 Interdisciplinary Treatment and Diagnostic Plan Update  12/09/2023 Time of Session: 11: 13 AM Angie [name] MRN: [id number]  Principal Diagnosis: MDD (major depressive disorder)  Secondary Diagnoses: Principal Problem:   MDD (major depressive disorder) Active Problems:   Schizophrenia (HCC)   Current Medications:  Current Facility-Administered Medications  Medication Dose Route Frequency Provider Last Rate Last Admin   acetaminophen  (TYLENOL ) tablet 650 mg  650 mg Oral Q6H PRN McLauchlin, Angela, NP       alum & mag hydroxide-simeth (MAALOX/MYLANTA) 200-200-20 MG/5ML suspension 30 mL  30 mL Oral Q4H PRN McLauchlin, Angela, NP       haloperidol  (HALDOL ) tablet 5 mg  5 mg Oral TID PRN McLauchlin, Angela, NP       And   diphenhydrAMINE  (BENADRYL ) capsule 50 mg  50 mg Oral TID PRN McLauchlin, Angela, NP       haloperidol  lactate (HALDOL ) injection 5 mg  5 mg Intramuscular TID PRN McLauchlin, Angela, NP       And   diphenhydrAMINE  (BENADRYL ) injection 50 mg  50 mg Intramuscular TID PRN McLauchlin, Angela, NP       And   LORazepam  (ATIVAN ) injection 2 mg  2 mg Intramuscular TID PRN McLauchlin, Angela, NP       haloperidol  lactate (HALDOL ) injection 10 mg  10 mg Intramuscular TID PRN McLauchlin, Angela, NP       And   diphenhydrAMINE  (BENADRYL ) injection 50 mg  50 mg Intramuscular TID PRN McLauchlin, Angela, NP       And   LORazepam  (ATIVAN ) injection 2 mg  2 mg Intramuscular TID PRN McLauchlin, Angela, NP       DULoxetine  (CYMBALTA ) DR capsule 30 mg  30 mg Oral BID Jadapalle, Sree, MD   30 mg at 12/09/23 1144   feeding supplement (ENSURE PLUS HIGH PROTEIN) liquid 237 mL  237 mL Oral BID BM Jadapalle, Sree, MD       hydrOXYzine  (ATARAX ) tablet 25 mg  25 mg Oral TID PRN McLauchlin, Angela, NP   25 mg at 12/09/23 0029   lidocaine  (LIDODERM ) 5 % 1 patch  1 patch Transdermal Q24H Jadapalle, Sree, MD   1 patch at 12/09/23 1144   lip balm (CARMEX) ointment   Topical PRN Jadapalle, Sree, MD        magnesium  hydroxide (MILK OF MAGNESIA) suspension 30 mL  30 mL Oral Daily PRN McLauchlin, Angela, NP       multivitamin with minerals tablet 1 tablet  1 tablet Oral Daily Jadapalle, Sree, MD   1 tablet at 12/09/23 1026   traZODone  (DESYREL ) tablet 50 mg  50 mg Oral QHS PRN McLauchlin, Angela, NP   50 mg at 12/08/23 2157   PTA Medications: Medications Prior to Admission  Medication Sig Dispense Refill Last Dose/Taking   lidocaine  (XYLOCAINE ) 5 % ointment Apply 1 Application topically 2 (two) times daily as needed (For pain).      magnesium  30 MG tablet Take 30 mg by mouth 2 (two) times daily.      Multiple Vitamin (MULTIVITAMIN WITH MINERALS) TABS tablet Take 1 tablet by mouth daily.      naloxone  (NARCAN ) nasal spray 4 mg/0.1 mL Place 1 spray into the nose Once PRN (For opioid overdose). (Patient not taking: Reported on 12/07/2023)      oxymetazoline  (VICKS SINEX SEVERE) 0.05 % nasal spray Place 1-2 sprays into both nostrils daily.      Potassium 99 MG TABS Take 99 mg by mouth daily.  QUEtiapine  (SEROQUEL ) 100 MG tablet Take 100 mg by mouth at bedtime. (Patient not taking: Reported on 12/07/2023)       Patient Stressors: Financial difficulties   Marital or family conflict   Medication change or noncompliance   Substance abuse    Patient Strengths: Ability for insight  Active sense of humor  Communication skills  Motivation for treatment/growth   Treatment Modalities: Medication Management, Group therapy, Case management,  1 to 1 session with clinician, Psychoeducation, Recreational therapy.   Physician Treatment Plan for Primary Diagnosis: MDD (major depressive disorder) Long Term Goal(s): Improvement in symptoms so as ready for discharge   Short Term Goals: Ability to identify changes in lifestyle to reduce recurrence of condition will improve Ability to verbalize feelings will improve Ability to disclose and discuss suicidal ideas Ability to demonstrate self-control will  improve Ability to identify and develop effective coping behaviors will improve  Medication Management: Evaluate patient's response, side effects, and tolerance of medication regimen.  Therapeutic Interventions: 1 to 1 sessions, Unit Group sessions and Medication administration.  Evaluation of Outcomes: Not Progressing  Physician Treatment Plan for Secondary Diagnosis: Principal Problem:   MDD (major depressive disorder) Active Problems:   Schizophrenia (HCC)  Long Term Goal(s): Improvement in symptoms so as ready for discharge   Short Term Goals: Ability to identify changes in lifestyle to reduce recurrence of condition will improve Ability to verbalize feelings will improve Ability to disclose and discuss suicidal ideas Ability to demonstrate self-control will improve Ability to identify and develop effective coping behaviors will improve     Medication Management: Evaluate patient's response, side effects, and tolerance of medication regimen.  Therapeutic Interventions: 1 to 1 sessions, Unit Group sessions and Medication administration.  Evaluation of Outcomes: Not Progressing   RN Treatment Plan for Primary Diagnosis: MDD (major depressive disorder) Long Term Goal(s): Knowledge of disease and therapeutic regimen to maintain health will improve  Short Term Goals: Ability to remain free from injury will improve, Ability to verbalize frustration and anger appropriately will improve, Ability to demonstrate self-control, Ability to participate in decision making will improve, Ability to verbalize feelings will improve, Ability to disclose and discuss suicidal ideas, Ability to identify and develop effective coping behaviors will improve, and Compliance with prescribed medications will improve  Medication Management: RN will administer medications as ordered by provider, will assess and evaluate patient's response and provide education to patient for prescribed medication. RN will  report any adverse and/or side effects to prescribing provider.  Therapeutic Interventions: 1 on 1 counseling sessions, Psychoeducation, Medication administration, Evaluate responses to treatment, Monitor vital signs and CBGs as ordered, Perform/monitor CIWA, COWS, AIMS and Fall Risk screenings as ordered, Perform wound care treatments as ordered.  Evaluation of Outcomes: Not Progressing   LCSW Treatment Plan for Primary Diagnosis: MDD (major depressive disorder) Long Term Goal(s): Safe transition to appropriate next level of care at discharge, Engage patient in therapeutic group addressing interpersonal concerns.  Short Term Goals: Engage patient in aftercare planning with referrals and resources, Increase social support, Increase ability to appropriately verbalize feelings, Increase emotional regulation, Facilitate acceptance of mental health diagnosis and concerns, Facilitate patient progression through stages of change regarding substance use diagnoses and concerns, Identify triggers associated with mental health/substance abuse issues, and Increase skills for wellness and recovery  Therapeutic Interventions: Assess for all discharge needs, 1 to 1 time with Social worker, Explore available resources and support systems, Assess for adequacy in community support network, Educate family and significant other(s) on  suicide prevention, Complete Psychosocial Assessment, Interpersonal group therapy.  Evaluation of Outcomes: Not Progressing   Progress in Treatment: Attending groups: No. Participating in groups: No. Taking medication as prescribed: Yes. Toleration medication: Yes. Family/Significant other contact made: No, will contact:  CSW will contact if given permission  Patient understands diagnosis: Yes. Discussing patient identified problems/goals with staff: Yes. Medical problems stabilized or resolved: Yes. Denies suicidal/homicidal ideation: Yes. Issues/concerns per patient  self-inventory: No. Other: None   New problem(s) identified: No, Describe:  None identified   New Short Term/Long Term Goal(s): detox, elimination of symptoms of psychosis, medication management for mood stabilization; elimination of SI thoughts; development of comprehensive mental wellness/sobriety plan.   Patient Goals:  I've already come to the realization of what was my part I played in it and accept the responsibilities. I want to get this detox over and get home so I can talk to my son  Discharge Plan or Barriers: CSW will assist with appropriate discharge planning   Reason for Continuation of Hospitalization: Depression Medication stabilization Suicidal ideation  Estimated Length of Stay: 1 to 7 days   Last 3 [place] Suicide Severity Risk Score: Flowsheet Row Admission (Current) from 12/07/2023 in [facility] Most recent reading at 12/07/2023 10:15 PM ED from 12/07/2023 in [facility] Most recent reading at 12/07/2023  2:55 PM ED from 11/18/2022 in [facility] at [facility] Most recent reading at 11/18/2022  1:43 PM  C-SSRS RISK CATEGORY High Risk High Risk No Risk    Last PHQ 2/9 Scores:     No data to display          Scribe for Treatment Team: Lum [name], [place] 12/09/2023 2:35 PM

## 2023-12-09 NOTE — Progress Notes (Signed)
   12/09/23 1500  Psych Admission Type (Psych Patients Only)  Admission Status Voluntary  Psychosocial Assessment  Patient Complaints Depression  Eye Contact Brief  Facial Expression Flat  Affect Flat  Speech Logical/coherent  Interaction Isolative  Motor Activity Slow  Appearance/Hygiene In scrubs  Behavior Characteristics Cooperative  Mood Depressed  Thought Process  Coherency WDL  Content Preoccupation  Delusions None reported or observed  Perception WDL  Hallucination None reported or observed  Judgment Impaired  Confusion None  Danger to Self  Current suicidal ideation? Denies  Agreement Not to Harm Self Yes  Description of Agreement verbal  Danger to Others  Danger to Others None reported or observed  Danger to Others Abnormal  Harmful Behavior to others No threats or harm toward other people  Destructive Behavior No threats or harm toward property

## 2023-12-10 NOTE — Progress Notes (Signed)
 [facility] MD Progress Note  12/10/2023 8:09 PM Angie [name]  MRN:  [id number]  Angie [name] is a 58 year old female with PMH of MDD, GAD, PTSD, bipolar disorder, MST, opioid use disorder, alcohol use disorder, chronic pain syndrome, fibromyalgia, DM, HTN, and osteoarthritis. She presents to the inpatient psych unit for suicidal ideations and homicidal ideations. Patient has a history of 2 suicide attempts in the past, one in 2006 by overdosing on Tylenol  when she was pregnant with her son the other in 2005 unknown method. She required hospitalization for both.  Subjective:  Chart reviewed, case discussed in multidisciplinary meeting, patient seen during rounds.  Patient met with the treatment team today.  She reports that she is doing a lot better and denies SI/HI/plan.    Patient has significant challenges with chemical dependency she is focused on getting lidocaine  patch and medications for sleep and lip balm she minimizes her challenges with chemical dependency but denies any current suicidal homicidal thoughts we discussed about her child in CPS custody patient is interested in treatment options later.  Currently spending lot of time in her room.   Sleep: Poor  Appetite:  Poor  Past Psychiatric History: see h&P Family History:  Family History  Problem Relation Age of Onset   Diabetes Father    Heart disease Father    Depression Maternal Grandmother    Heart disease Maternal Grandfather    Depression Paternal Grandmother    Colon cancer Paternal Grandfather    Pancreatic cancer Paternal Grandfather    Social History:  Social History   Substance and Sexual Activity  Alcohol Use Not Currently   Alcohol/week: 17.0 standard drinks of alcohol   Types: 6 Glasses of wine, 11 Shots of liquor per week   Comment: occ     Social History   Substance and Sexual Activity  Drug Use No    Social History   Socioeconomic History   Marital status: Widowed    Spouse name: Not on file   Number of  children: 4   Years of education: Not on file   Highest education level: Not on file  Occupational History   Not on file  Tobacco Use   Smoking status: Former    Current packs/day: 0.00    Average packs/day: 0.5 packs/day for 4.0 years (2.0 ttl pk-yrs)    Types: Cigarettes    Start date: 03/16/2008    Quit date: 03/16/2012    Years since quitting: 11.7   Smokeless tobacco: Never  Vaping Use   Vaping status: Never Used  Substance and Sexual Activity   Alcohol use: Not Currently    Alcohol/week: 17.0 standard drinks of alcohol    Types: 6 Glasses of wine, 11 Shots of liquor per week    Comment: occ   Drug use: No   Sexual activity: Yes    Birth control/protection: None, Surgical  Other Topics Concern   Not on file  Social History Narrative   Not on file   Social Drivers of Health   Financial Resource Strain: Not on file  Food Insecurity: No Food Insecurity (12/08/2023)   Hunger Vital Sign    Worried About Running Out of Food in the Last Year: Never true    Ran Out of Food in the Last Year: Never true  Transportation Needs: No Transportation Needs (12/08/2023)   PRAPARE - Administrator, Civil Service (Medical): No    Lack of Transportation (Non-Medical): No  Physical Activity: Not on file  Stress: Not on file  Social Connections: Unknown (09/08/2021)   Received [facility]   Social Network    Social Network: Not on file   Past Medical History:  Past Medical History:  Diagnosis Date   Abnormal Pap smear    Alcohol abuse    Anemia    IDA   Anxiety    Takes xanax    Arthritis    left hip/knees, lower spine   Bilateral lower extremity edema    Bipolar disorder (HCC)    Blood transfusion without reported diagnosis last done 04-25-17   Bulging lumbar disc    Bursitis    left shoulder   Chronic lower back pain    Cut    right middle finger with knife small cut healing pt instructed to keep clean and dry no redness or drainage   Depression     Diverticulitis    Fibromyalgia    Gastrointestinal tube present (HCC)    GERD (gastroesophageal reflux disease)    Hypertension    none since weight loss, no medications at this time   Hypoglycemia    Lactose intolerance in adult 2017   Migraines    weekly @ least (04/26/2016)   Seizures (HCC)    one Dec. 2017 due to throat closing   Type II diabetes mellitus (HCC)    before the gastric bypass (04/26/2016) states she no longer has diabetes    Past Surgical History:  Procedure Laterality Date   BALLOON DILATION N/A 05/27/2016   Procedure: BALLOON DILATION;  Surgeon: Victory LITTIE Legrand DOUGLAS, MD;  Location: [facility];  Service: Gastroenterology;  Laterality: N/A;   BALLOON DILATION N/A 04/21/2017   Procedure: BALLOON DILATION;  Surgeon: Legrand Victory LITTIE DOUGLAS, MD;  Location: [facility];  Service: Gastroenterology;  Laterality: N/A;   CERVICAL CONE BIOPSY     CESAREAN SECTION  1993; 1996; 1998; 2014   CESAREAN SECTION WITH BILATERAL TUBAL LIGATION Bilateral 10/28/2012   Procedure: Repeat cesarean section with delivery of baby boy at 1210. Apgars 8/9.  BILATERAL TUBAL LIGATION;  Surgeon: Vonn VEAR Inch, MD;  Location: [facility];  Service: Obstetrics;  Laterality: Bilateral;   DILATION AND CURETTAGE OF UTERUS     ESOPHAGOGASTRODUODENOSCOPY N/A 05/27/2016   Procedure: ESOPHAGOGASTRODUODENOSCOPY (EGD);  Surgeon: Victory LITTIE Legrand DOUGLAS, MD;  Location: [facility];  Service: Gastroenterology;  Laterality: N/A;   ESOPHAGOGASTRODUODENOSCOPY (EGD) WITH PROPOFOL  N/A 04/23/2016   Procedure: ESOPHAGOGASTRODUODENOSCOPY (EGD) WITH PROPOFOL ;  Surgeon: Victory LITTIE Legrand DOUGLAS, MD;  Location: [facility];  Service: Endoscopy;  Laterality: N/A;   ESOPHAGOGASTRODUODENOSCOPY (EGD) WITH PROPOFOL  N/A 01/03/2017   Procedure: ESOPHAGOGASTRODUODENOSCOPY (EGD) WITH PROPOFOL ;  Surgeon: Legrand Victory LITTIE DOUGLAS, MD;  Location: [facility];  Service: Gastroenterology;  Laterality: N/A;   ESOPHAGOGASTRODUODENOSCOPY (EGD) WITH PROPOFOL  N/A  04/21/2017   Procedure: ESOPHAGOGASTRODUODENOSCOPY (EGD) WITH PROPOFOL ;  Surgeon: Legrand Victory LITTIE DOUGLAS, MD;  Location[facility];  Service: Gastroenterology;  Laterality: N/A;   ESOPHAGOGASTRODUODENOSCOPY (EGD) WITH PROPOFOL  N/A 05/02/2019   Procedure: ESOPHAGOGASTRODUODENOSCOPY (EGD) WITH PROPOFOL ;  Surgeon: Abran Norleen SAILOR, MD;  Location: [facility] ENDOSCOPY;  Service: Endoscopy;  Laterality: N/A;   GASTROSTOMY N/A 08/16/2016   Procedure: LAPAROSCOPIC INSERTION OF GASTROSTOMY TUBE IN REMNANT STOMACH;  Surgeon: Herlene Righter Kinsinger, MD;  Location: [facility];  Service: General;  Laterality: N/A;   GASTROSTOMY N/A 05/16/2017   Procedure: Replacement of  GASTROSTOMY TUBE;  Surgeon: Stevie Herlene Righter, MD;  Location: [facility];  Service: General;  Laterality: N/A;   IR CM INJ ANY  COLONIC TUBE W/FLUORO  05/15/2019   LAPAROSCOPIC INSERTION GASTROSTOMY TUBE Left 05/04/2019   Procedure: LAPAROSCOPIC INSERTION GASTROSTOMY TUBE;  Surgeon: Stevie Herlene Righter, MD;  Location: [facility];  Service: General;  Laterality: Left;   LAPAROSCOPIC REVISION OF GASTROJEJUNOSTOMY Left 05/16/2017   Procedure: LAPAROSCOPIC REVISION OF GASTROJEJUNOSTOMY;  Surgeon: Stevie Herlene Righter, MD;  Location: [facility];  Service: General;  Laterality: Left;   LAPAROSCOPIC REVISION OF ROUXENY WITH UPPER ENDOSCOPY N/A 08/13/2019   Procedure: LAPAROSCOPIC REVISION OF ROUX EN Y WITH UPPER ENDOSCOPY; REMOVAL GASTROSTOMY TUBE, TAKEDOWN OF GASTROJEJUNOSTOMY; RESECTION OF SMALL INTESTINE ERAS PATHWAY;  Surgeon: Kinsinger, Herlene Righter, MD;  Location: [facility];  Service: General;  Laterality: N/A;   ROUX-EN-Y GASTRIC BYPASS  2007   TUBAL LIGATION  2014    Current Medications: Current Facility-Administered Medications  Medication Dose Route Frequency Provider Last Rate Last Admin   acetaminophen  (TYLENOL ) tablet 650 mg  650 mg Oral Q6H PRN McLauchlin, Angela, NP   650 mg at 12/10/23 1708   alum & mag hydroxide-simeth (MAALOX/MYLANTA) 200-200-20 MG/5ML suspension 30  mL  30 mL Oral Q4H PRN McLauchlin, Angela, NP       cloNIDine  (CATAPRES ) tablet 0.1 mg  0.1 mg Oral QID Jadapalle, Sree, MD   0.1 mg at 12/10/23 1510   Followed by   NOREEN ON 12/11/2023] cloNIDine  (CATAPRES ) tablet 0.1 mg  0.1 mg Oral Letha Donnelly Mellow, MD       Followed by   NOREEN ON 12/14/2023] cloNIDine  (CATAPRES ) tablet 0.1 mg  0.1 mg Oral QAC breakfast Jadapalle, Sree, MD       dicyclomine  (BENTYL ) tablet 20 mg  20 mg Oral Q6H PRN Jadapalle, Sree, MD       haloperidol  (HALDOL ) tablet 5 mg  5 mg Oral TID PRN McLauchlin, Angela, NP       And   diphenhydrAMINE  (BENADRYL ) capsule 50 mg  50 mg Oral TID PRN McLauchlin, Jon, NP       haloperidol  lactate (HALDOL ) injection 5 mg  5 mg Intramuscular TID PRN McLauchlin, Angela, NP       And   diphenhydrAMINE  (BENADRYL ) injection 50 mg  50 mg Intramuscular TID PRN McLauchlin, Angela, NP       And   LORazepam  (ATIVAN ) injection 2 mg  2 mg Intramuscular TID PRN McLauchlin, Jon, NP       haloperidol  lactate (HALDOL ) injection 10 mg  10 mg Intramuscular TID PRN McLauchlin, Angela, NP       And   diphenhydrAMINE  (BENADRYL ) injection 50 mg  50 mg Intramuscular TID PRN McLauchlin, Angela, NP       And   LORazepam  (ATIVAN ) injection 2 mg  2 mg Intramuscular TID PRN McLauchlin, Angela, NP       DULoxetine  (CYMBALTA ) DR capsule 30 mg  30 mg Oral BID Jadapalle, Sree, MD   30 mg at 12/10/23 1102   feeding supplement (ENSURE PLUS HIGH PROTEIN) liquid 237 mL  237 mL Oral BID BM Jadapalle, Sree, MD       hydrOXYzine  (ATARAX ) tablet 50 mg  50 mg Oral TID PRN Jadapalle, Sree, MD       lidocaine  (LIDODERM ) 5 % 1 patch  1 patch Transdermal Q24H Donnelly Mellow, MD   1 patch at 12/10/23 1102   lip balm (CARMEX) ointment   Topical PRN Jadapalle, Sree, MD   1 Application at 12/10/23 1102   loperamide  (IMODIUM ) capsule 2-4 mg  2-4 mg Oral PRN Jadapalle, Sree, MD  magnesium  hydroxide (MILK OF MAGNESIA) suspension 30 mL  30 mL Oral Daily PRN McLauchlin,  Angela, NP       methocarbamol  (ROBAXIN ) tablet 500 mg  500 mg Oral Q8H PRN Jadapalle, Sree, MD       multivitamin with minerals tablet 1 tablet  1 tablet Oral Daily Jadapalle, Sree, MD   1 tablet at 12/10/23 1102   naproxen  (NAPROSYN ) tablet 500 mg  500 mg Oral BID PRN Jadapalle, Sree, MD       ondansetron  (ZOFRAN -ODT) disintegrating tablet 4 mg  4 mg Oral Q6H PRN Jadapalle, Sree, MD       traZODone  (DESYREL ) tablet 100 mg  100 mg Oral QHS PRN Jadapalle, Sree, MD   100 mg at 12/10/23 0235    Lab Results:  No results found for this or any previous visit (from the past 48 hours).   Blood Alcohol level:  Lab Results  Component Value Date   [facility] <15 12/07/2023   ETH <10 05/12/2019    Metabolic Disorder Labs: Lab Results  Component Value Date   HGBA1C 5.6 10/01/2022   MPG 114.02 10/01/2022   MPG 88 08/06/2019   No results found for: PROLACTIN Lab Results  Component Value Date   CHOL 168 12/07/2023   TRIG 187 (H) 12/07/2023   HDL 69 12/07/2023   CHOLHDL 2.4 12/07/2023   VLDL 37 12/07/2023   LDLCALC 62 12/07/2023   LDLCALC  01/22/2007    47        Total Cholesterol/HDL:CHD Risk Coronary Heart Disease Risk Table                     Men   Women  1/2 Average Risk   3.4   3.3    Physical Findings: AIMS:  , ,  ,  ,    CIWA:    COWS:  COWS Total Score: 4   Psychiatric Specialty Exam:  Presentation  General Appearance:  Disheveled  Eye Contact:No data recorded Speech:No data recorded Speech Volume: Normal    Mood and Affect  Mood: Anxious; Depressed  Affect: Congruent   Thought Process  Thought Processes: Coherent  Descriptions of Associations:Intact  Orientation:Full (Time, Place and Person)  Thought Content:Illogical  Hallucinations:No data recorded Ideas of Reference:Paranoia; Delusions  Suicidal Thoughts:No data recorded Homicidal Thoughts:No data recorded  Sensorium  Memory: Immediate Poor  Judgment:No data  recorded Insight: Poor   Executive Functions  Concentration: Fair  Attention Span: Fair  Recall: Fair  Fund of Knowledge: Poor  Language: Fair   Psychomotor Activity  Psychomotor Activity:No data recorded Musculoskeletal: Strength & Muscle Tone: within normal limits Gait & Station: normal Assets  Assets: Resilience    Physical Exam: Physical Exam Vitals and nursing note reviewed.    ROS Blood pressure 132/89, pulse 66, temperature 98 F (36.7 C), resp. rate 18, height 5' (1.524 m), weight 51.5 kg, SpO2 100%. Body mass index is 22.17 kg/m.  Diagnosis: Principal Problem:   MDD (major depressive disorder) Active Problems:   Schizophrenia (HCC)   PLAN: Safety and Monitoring:  -- Voluntary admission to inpatient psychiatric unit for safety, stabilization and treatment  -- Daily contact with patient to assess and evaluate symptoms and progress in treatment  -- Patient's case to be discussed in multi-disciplinary team meeting  -- Observation Level : q15 minute checks  -- Vital signs:  q12 hours  -- Precautions: suicide, elopement, and assault -- Encouraged patient to participate in unit milieu and in scheduled group therapies  2. Psychiatric Diagnoses and Treatment:    Clonidine  detox protocol was initiated Cymbalta  30 mg BID     3. Medical Issues Being Addressed:   Opioid use d/o  4. Discharge Planning:   -- Social work and case management to assist with discharge planning and identification of hospital follow-up needs prior to discharge  -- Estimated LOS: 3-4 days  Millie JONELLE Manners, MD 12/10/2023, 8:09 PM

## 2023-12-10 NOTE — Progress Notes (Signed)
 Patient is pleasant and cooperative.  Declined breakfast after not sleeping well last night. Endorses anxiety. Denies SI/HI and AVH. Pain 7/10 in lower back.  Compliant with scheduled medications. 15 min checks in place for safety. Patient isolates to room.  Did not attend MHT group.   Pt reports 1 episode of vomiting (unwitnessed).  Denies feeling nauseous. Denies diarrhea. Declined Zofran . PRN tylenol  given for headache/ body aches (5/10).  Declined Robaxin  and naproxen .   Patient was allowed to have dinner in her room tonight.  Patient skipped lunch and dinner, sleeping the majority of the shift. Evening dose of clonidine  held per Dr. Ruther (too sleepy).    COWS score 4

## 2023-12-10 NOTE — Plan of Care (Signed)
  Problem: Education: Goal: Knowledge of the prescribed therapeutic regimen will improve Outcome: Progressing   Problem: Coping: Goal: Will verbalize feelings Outcome: Progressing   Problem: Activity: Goal: Interest or engagement in leisure activities will improve Outcome: Not Progressing Goal: Imbalance in normal sleep/wake cycle will improve Outcome: Not Progressing

## 2023-12-10 NOTE — Group Note (Signed)
 Date:  12/10/2023 Time:  10:58 PM  Group Topic/Focus:  Dimensions of Wellness:   The focus of this group is to introduce the topic of wellness and discuss the role each dimension of wellness plays in total health. Emotional Education:   The focus of this group is to discuss what feelings/emotions are, and how they are experienced. Managing Feelings:   The focus of this group is to identify what feelings patients have difficulty handling and develop a plan to handle them in a healthier way upon discharge.    Participation Level:  Active  Participation Quality:  Appropriate and Attentive  Affect:  Appropriate  Cognitive:  Alert and Appropriate  Insight: Appropriate and Good  Engagement in Group:  Engaged and Supportive  Modes of Intervention:  Discussion, Socialization, and Support  Additional Comments:  Pt did attend group. Will continue to motivate pt to keep attending group with peers.  Brad GORMAN Ryder 12/10/2023, 10:58 PM

## 2023-12-10 NOTE — Plan of Care (Signed)
 Angie [name] is a 58 y.o. female patient. No diagnosis found. Past Medical History:  Diagnosis Date   Abnormal Pap smear    Alcohol abuse    Anemia    IDA   Anxiety    Takes xanax    Arthritis    left hip/knees, lower spine   Bilateral lower extremity edema    Bipolar disorder (HCC)    Blood transfusion without reported diagnosis last done 04-25-17   Bulging lumbar disc    Bursitis    left shoulder   Chronic lower back pain    Cut    right middle finger with knife small cut healing pt instructed to keep clean and dry no redness or drainage   Depression    Diverticulitis    Fibromyalgia    Gastrointestinal tube present (HCC)    GERD (gastroesophageal reflux disease)    Hypertension    none since weight loss, no medications at this time   Hypoglycemia    Lactose intolerance in adult 2017   Migraines    weekly @ least (04/26/2016)   Seizures (HCC)    one Dec. 2017 due to throat closing   Type II diabetes mellitus (HCC)    before the gastric bypass (04/26/2016) states she no longer has diabetes   Current Facility-Administered Medications  Medication Dose Route Frequency Provider Last Rate Last Admin   acetaminophen  (TYLENOL ) tablet 650 mg  650 mg Oral Q6H PRN McLauchlin, Angela, NP       alum & mag hydroxide-simeth (MAALOX/MYLANTA) 200-200-20 MG/5ML suspension 30 mL  30 mL Oral Q4H PRN McLauchlin, Angela, NP       cloNIDine  (CATAPRES ) tablet 0.1 mg  0.1 mg Oral QID Jadapalle, Sree, MD   0.1 mg at 12/09/23 2117   Followed by   NOREEN ON 12/11/2023] cloNIDine  (CATAPRES ) tablet 0.1 mg  0.1 mg Oral Letha Donnelly Mellow, MD       Followed by   NOREEN ON 12/14/2023] cloNIDine  (CATAPRES ) tablet 0.1 mg  0.1 mg Oral QAC breakfast Jadapalle, Sree, MD       dicyclomine  (BENTYL ) tablet 20 mg  20 mg Oral Q6H PRN Donnelly Mellow, MD       haloperidol  (HALDOL ) tablet 5 mg  5 mg Oral TID PRN McLauchlin, Angela, NP       And   diphenhydrAMINE  (BENADRYL ) capsule 50 mg  50 mg Oral TID  PRN McLauchlin, Angela, NP       haloperidol  lactate (HALDOL ) injection 5 mg  5 mg Intramuscular TID PRN McLauchlin, Angela, NP       And   diphenhydrAMINE  (BENADRYL ) injection 50 mg  50 mg Intramuscular TID PRN McLauchlin, Angela, NP       And   LORazepam  (ATIVAN ) injection 2 mg  2 mg Intramuscular TID PRN McLauchlin, Jon, NP       haloperidol  lactate (HALDOL ) injection 10 mg  10 mg Intramuscular TID PRN McLauchlin, Angela, NP       And   diphenhydrAMINE  (BENADRYL ) injection 50 mg  50 mg Intramuscular TID PRN McLauchlin, Angela, NP       And   LORazepam  (ATIVAN ) injection 2 mg  2 mg Intramuscular TID PRN McLauchlin, Angela, NP       DULoxetine  (CYMBALTA ) DR capsule 30 mg  30 mg Oral BID Jadapalle, Sree, MD   30 mg at 12/09/23 2118   feeding supplement (ENSURE PLUS HIGH PROTEIN) liquid 237 mL  237 mL Oral BID BM Jadapalle, Sree, MD  hydrOXYzine  (ATARAX ) tablet 50 mg  50 mg Oral TID PRN Jadapalle, Sree, MD       lidocaine  (LIDODERM ) 5 % 1 patch  1 patch Transdermal Q24H Donnelly Mellow, MD   1 patch at 12/09/23 1144   lip balm (CARMEX) ointment   Topical PRN Jadapalle, Sree, MD       loperamide  (IMODIUM ) capsule 2-4 mg  2-4 mg Oral PRN Jadapalle, Sree, MD       magnesium  hydroxide (MILK OF MAGNESIA) suspension 30 mL  30 mL Oral Daily PRN McLauchlin, Angela, NP       methocarbamol  (ROBAXIN ) tablet 500 mg  500 mg Oral Q8H PRN Jadapalle, Sree, MD       multivitamin with minerals tablet 1 tablet  1 tablet Oral Daily Jadapalle, Sree, MD   1 tablet at 12/09/23 1026   naproxen  (NAPROSYN ) tablet 500 mg  500 mg Oral BID PRN Jadapalle, Sree, MD       ondansetron  (ZOFRAN -ODT) disintegrating tablet 4 mg  4 mg Oral Q6H PRN Jadapalle, Sree, MD       traZODone  (DESYREL ) tablet 100 mg  100 mg Oral QHS PRN Jadapalle, Sree, MD   100 mg at 12/10/23 0235   Allergies  Allergen Reactions   Iron  Anaphylaxis and Itching    Had acute allergy with tachycardia, attention and generalized itching shortly after  receiving nulecit ( iron  gluconate) infusion   Lactose Intolerance (Gi) Other (See Comments)    GI upset   Other Other (See Comments)    Seasonal allergies - stuffy nose   Principal Problem:   MDD (major depressive disorder) Active Problems:   Schizophrenia (HCC)  Blood pressure (!) 144/92, pulse 64, temperature 98.1 F (36.7 C), resp. rate 18, height 5' (1.524 m), weight 51.5 kg, SpO2 100%.  Subjective Objective Assessment & Plan  Patient had a very flat affect. Upon assessment she denied SI/HI and anxiety. She had mild irritability.  She required a prn to help sleep late in the night.   Isaack Preble B Jayleon Mcfarlane 12/10/2023

## 2023-12-11 NOTE — Plan of Care (Signed)
  Problem: Physical Regulation: Goal: Complications related to the disease process, condition or treatment will be avoided or minimized Outcome: Progressing   Problem: Safety: Goal: Ability to remain free from injury will improve Outcome: Progressing

## 2023-12-11 NOTE — Progress Notes (Signed)
   12/11/23 1400  Psych Admission Type (Psych Patients Only)  Admission Status Voluntary  Psychosocial Assessment  Patient Complaints Depression  Eye Contact Fair  Facial Expression Sad  Affect Flat  Speech Logical/coherent  Interaction Minimal  Motor Activity Slow  Appearance/Hygiene In scrubs  Behavior Characteristics Cooperative  Mood Despair  Thought Process  Coherency WDL  Content WDL  Delusions None reported or observed  Perception WDL  Hallucination None reported or observed  Judgment Impaired  Confusion None  Danger to Self  Current suicidal ideation? Denies  Agreement Not to Harm Self No  Description of Agreement verbal  Danger to Others  Danger to Others None reported or observed  Danger to Others Abnormal  Harmful Behavior to others No threats or harm toward other people  Destructive Behavior No threats or harm toward property

## 2023-12-11 NOTE — Group Note (Signed)
 Date:  12/11/2023 Time:  9:47 PM  Group Topic/Focus:  Wrap-Up Group:   The focus of this group is to help patients review their daily goal of treatment and discuss progress on daily workbooks.    Participation Level:  Active  Participation Quality:  Appropriate  Affect:  Appropriate  Cognitive:  Appropriate  Insight: Appropriate  Engagement in Group:  Engaged  Modes of Intervention:  Discussion  Additional Comments:    Angie [name] 12/11/2023, 9:47 PM

## 2023-12-11 NOTE — Group Note (Signed)
 Date:  12/11/2023 Time:  1:08 PM  Group Topic/Focus:  Making Healthy Choices:   The focus of this group is to help patients identify negative/unhealthy choices they were using prior to admission and identify positive/healthier coping strategies to replace them upon discharge.    Participation Level:  Did Not Attend   Angie [name] 12/11/2023, 1:08 PM

## 2023-12-11 NOTE — Plan of Care (Signed)
  Problem: Coping: Goal: Coping ability will improve Outcome: Progressing   Problem: Activity: Goal: Interest or engagement in leisure activities will improve Outcome: Not Progressing Goal: Imbalance in normal sleep/wake cycle will improve Outcome: Not Progressing

## 2023-12-11 NOTE — Progress Notes (Signed)
   12/11/23 0000  Psych Admission Type (Psych Patients Only)  Admission Status Voluntary  Psychosocial Assessment  Patient Complaints Anxiety  Eye Contact Fair  Facial Expression Sad  Affect Flat  Speech Logical/coherent  Interaction Minimal;Isolative  Motor Activity Slow  Appearance/Hygiene In scrubs;Unremarkable  Behavior Characteristics Cooperative  Mood Pleasant;Sad  Thought Process  Coherency WDL  Content Preoccupation  Delusions None reported or observed  Perception WDL  Hallucination None reported or observed  Judgment Impaired  Confusion None  Danger to Self  Current suicidal ideation? Denies  Agreement Not to Harm Self Yes  Description of Agreement Verbal  Danger to Others  Danger to Others None reported or observed  Danger to Others Abnormal  Harmful Behavior to others No threats or harm toward other people  Destructive Behavior No threats or harm toward property   Pt alert & able to make needs known.  Refused participation in milieu, denies HI/SI/AVH.  Received all medications as scheduled to include PRN for sleep and mild anxiety.  Pt stated, I could not stay awake yesterday because the PRNs were not effective last night.  Effective with pt resting throughout the night.  Will continue to monitor, q79min safety checks remain in place.

## 2023-12-12 DIAGNOSIS — F332 Major depressive disorder, recurrent severe without psychotic features: Secondary | ICD-10-CM

## 2023-12-12 DIAGNOSIS — F209 Schizophrenia, unspecified: Secondary | ICD-10-CM | POA: Diagnosis not present

## 2023-12-12 NOTE — Progress Notes (Signed)
   12/12/23 0807  Psych Admission Type (Psych Patients Only)  Admission Status Voluntary  Psychosocial Assessment  Patient Complaints Depression  Eye Contact Fair  Facial Expression Blank  Affect Sullen  Speech Logical/coherent  Interaction Isolative  Motor Activity Slow  Appearance/Hygiene In scrubs  Behavior Characteristics Cooperative  Mood Depressed  Thought Process  Coherency WDL  Content WDL  Delusions None reported or observed  Perception WDL  Hallucination None reported or observed  Judgment Impaired  Confusion None  Danger to Self  Current suicidal ideation? Denies

## 2023-12-12 NOTE — [facility] Suicide Risk Assessment (Signed)
 [facility] INPATIENT:  Family/Significant Other Suicide Prevention Education  Suicide Prevention Education:  Education Completed; Cricket Molt, daughter, [phone],  (name of family member/significant other) has been identified by the patient as the family member/significant other with whom the patient will be residing, and identified as the person(s) who will aid the patient in the event of a mental health crisis (suicidal ideations/suicide attempt).  With written consent from the patient, the family member/significant other has been provided the following suicide prevention education, prior to the and/or following the discharge of the patient.  The suicide prevention education provided includes the following: Suicide risk factors Suicide prevention and interventions National Suicide Hotline telephone number [facility] assessment telephone number [facility] Emergency Assistance 911 [facility] and/or Residential Mobile Crisis Unit telephone number  Request made of family/significant other to: Remove weapons (e.g., guns, rifles, knives), all items previously/currently identified as safety concern.   Remove drugs/medications (over-the-counter, prescriptions, illicit drugs), all items previously/currently identified as a safety concern.  The family member/significant other verbalizes understanding of the suicide prevention education information provided.  The family member/significant other agrees to remove the items of safety concern listed above.  Pt's daughter reports that pt's [facility] counselor thought it would be a good idea for her to go to the hospital after she made statements saying that she wanted to harm herself and her neighbors.   I do not feel like she genuinely does not want to hurt her self or anyone else but I know if pushed far enough she could   Reports that she plans to stay with pt following her discharge. Reports she has been taking care of pt's apartment while  she is in the hospital.   Angie [name] 12/12/2023, 1:25 PM

## 2023-12-12 NOTE — [facility] Counselor (Signed)
 CSW contacted [facility] Desk: [phone] ext [phone]   CSW unable to reach, left HIPAA compliant Vm requesting return call.   Lum Croft, MSW, [place] 12/12/2023 3:55 PM

## 2023-12-12 NOTE — Plan of Care (Signed)
  Problem: Education: Goal: Utilization of techniques to improve thought processes will improve Outcome: Progressing Goal: Knowledge of the prescribed therapeutic regimen will improve Outcome: Progressing   Problem: Activity: Goal: Interest or engagement in leisure activities will improve Outcome: Progressing Goal: Imbalance in normal sleep/wake cycle will improve Outcome: Progressing   Problem: Coping: Goal: Coping ability will improve Outcome: Progressing Goal: Will verbalize feelings Outcome: Progressing   Problem: Health Behavior/Discharge Planning: Goal: Ability to make decisions will improve Outcome: Progressing Goal: Compliance with therapeutic regimen will improve Outcome: Progressing   Problem: Role Relationship: Goal: Will demonstrate positive changes in social behaviors and relationships Outcome: Progressing   Problem: Safety: Goal: Ability to disclose and discuss suicidal ideas will improve Outcome: Progressing Goal: Ability to identify and utilize support systems that promote safety will improve Outcome: Progressing   Problem: Self-Concept: Goal: Will verbalize positive feelings about self Outcome: Progressing Goal: Level of anxiety will decrease Outcome: Progressing   Problem: Education: Goal: Knowledge of [facility] Education information/materials will improve Outcome: Progressing Goal: Emotional status will improve Outcome: Progressing Goal: Mental status will improve Outcome: Progressing Goal: Verbalization of understanding the information provided will improve Outcome: Progressing   Problem: Activity: Goal: Interest or engagement in activities will improve Outcome: Progressing Goal: Sleeping patterns will improve Outcome: Progressing   Problem: Coping: Goal: Ability to verbalize frustrations and anger appropriately will improve Outcome: Progressing Goal: Ability to demonstrate self-control will improve Outcome: Progressing    Problem: Health Behavior/Discharge Planning: Goal: Identification of resources available to assist in meeting health care needs will improve Outcome: Progressing Goal: Compliance with treatment plan for underlying cause of condition will improve Outcome: Progressing   Problem: Physical Regulation: Goal: Ability to maintain clinical measurements within normal limits will improve Outcome: Progressing   Problem: Safety: Goal: Periods of time without injury will increase Outcome: Progressing   Problem: Education: Goal: Ability to make informed decisions regarding treatment will improve Outcome: Progressing   Problem: Coping: Goal: Coping ability will improve Outcome: Progressing   Problem: Health Behavior/Discharge Planning: Goal: Identification of resources available to assist in meeting health care needs will improve Outcome: Progressing   Problem: Medication: Goal: Compliance with prescribed medication regimen will improve Outcome: Progressing   Problem: Self-Concept: Goal: Ability to disclose and discuss suicidal ideas will improve Outcome: Progressing Goal: Will verbalize positive feelings about self Outcome: Progressing Note:     Problem: Physical Regulation: Goal: Complications related to the disease process, condition or treatment will be avoided or minimized Outcome: Progressing   Problem: Safety: Goal: Ability to remain free from injury will improve Outcome: Progressing

## 2023-12-12 NOTE — Progress Notes (Signed)
 Patient continuously pressing call button to say she would like to be discharged. Nurse explained 72 hour process but patient is snot cooperative.

## 2023-12-12 NOTE — Progress Notes (Addendum)
   12/12/23 1345  Spiritual Encounters  Type of Visit Follow up  Care provided to: Patient  Conversation partners present during encounter Social worker/Care management/TOC  Referral source Social worker/Care management/TOC  Reason for visit Routine spiritual support  OnCall Visit No   Chaplain visited with patient to check back after the patient declined a visit last week.  Chaplain offered a compassionate presence and reflective listening as patient shared about custody, familial dynamics and housing issues.  Patient shared she is lactose intolerant and needs to keep her strength up so she can be discharged and in good physical health for her son.  Chaplain checked on what products could be supplemented with patients' meals for weight gain and staff shared that there is an order in for lactose free products.  Staff will work with dietary to see if these are in stock.  Patient was encouraged by the fact that staff was trying to accommodate her.  Chaplain offered prayer at patient's request.    Rev. Rana M. Nicholaus, M.Div.  Chaplain Resident  [facility]

## 2023-12-12 NOTE — Progress Notes (Signed)
 [facility] MD Progress Note  12/12/2023 4:21 PM Angie [name]  MRN:  [id number]  Angie [name] is a 58 year old female with PMH of MDD, GAD, PTSD, bipolar disorder, MST, opioid use disorder, alcohol use disorder, chronic pain syndrome, fibromyalgia, DM, HTN, and osteoarthritis. She presents to the inpatient psych unit for suicidal ideations and homicidal ideations. Patient has a history of 2 suicide attempts in the past, one in 2006 by overdosing on Tylenol  when she was pregnant with her son the other in 2005 unknown method. She required hospitalization for both.  Subjective:  Chart reviewed, case discussed in multidisciplinary meeting, patient seen during rounds.  On interview patient is noted to be resting in bed.  She reports having concerns about her consistent low blood pressure and feeling very weak.  Provider did discuss that clonidine  will be discontinued and patient was reassured that clonidine  was held every time she had low blood pressure.  She remains discharge focused and wants to be discharged home so that she can pay her rent and take care of her things.  She denies SI/HI/plan and denies hallucinations.  Social work team is able to talk to her daughter who expressed no safety concerns on discharge   Sleep: Poor  Appetite:  Poor  Past Psychiatric History: see h&P Family History:  Family History  Problem Relation Age of Onset   Diabetes Father    Heart disease Father    Depression Maternal Grandmother    Heart disease Maternal Grandfather    Depression Paternal Grandmother    Colon cancer Paternal Grandfather    Pancreatic cancer Paternal Grandfather    Social History:  Social History   Substance and Sexual Activity  Alcohol Use Not Currently   Alcohol/week: 17.0 standard drinks of alcohol   Types: 6 Glasses of wine, 11 Shots of liquor per week   Comment: occ     Social History   Substance and Sexual Activity  Drug Use No    Social History   Socioeconomic History   Marital  status: Widowed    Spouse name: Not on file   Number of children: 4   Years of education: Not on file   Highest education level: Not on file  Occupational History   Not on file  Tobacco Use   Smoking status: Former    Current packs/day: 0.00    Average packs/day: 0.5 packs/day for 4.0 years (2.0 ttl pk-yrs)    Types: Cigarettes    Start date: 03/16/2008    Quit date: 03/16/2012    Years since quitting: 11.7   Smokeless tobacco: Never  Vaping Use   Vaping status: Never Used  Substance and Sexual Activity   Alcohol use: Not Currently    Alcohol/week: 17.0 standard drinks of alcohol    Types: 6 Glasses of wine, 11 Shots of liquor per week    Comment: occ   Drug use: No   Sexual activity: Yes    Birth control/protection: None, Surgical  Other Topics Concern   Not on file  Social History Narrative   Not on file   Social Drivers of Health   Financial Resource Strain: Not on file  Food Insecurity: No Food Insecurity (12/08/2023)   Hunger Vital Sign    Worried About Running Out of Food in the Last Year: Never true    Ran Out of Food in the Last Year: Never true  Transportation Needs: No Transportation Needs (12/08/2023)   PRAPARE - Transportation    Lack of Transportation (  Medical): No    Lack of Transportation (Non-Medical): No  Physical Activity: Not on file  Stress: Not on file  Social Connections: Unknown (09/08/2021)   [facility]   Social Network    Social Network: Not on file   Past Medical History:  Past Medical History:  Diagnosis Date   Abnormal Pap smear    Alcohol abuse    Anemia    IDA   Anxiety    Takes xanax    Arthritis    left hip/knees, lower spine   Bilateral lower extremity edema    Bipolar disorder (HCC)    Blood transfusion without reported diagnosis last done 04-25-17   Bulging lumbar disc    Bursitis    left shoulder   Chronic lower back pain    Cut    right middle finger with knife small cut healing pt instructed to keep  clean and dry no redness or drainage   Depression    Diverticulitis    Fibromyalgia    Gastrointestinal tube present (HCC)    GERD (gastroesophageal reflux disease)    Hypertension    none since weight loss, no medications at this time   Hypoglycemia    Lactose intolerance in adult 2017   Migraines    weekly @ least (04/26/2016)   Seizures (HCC)    one Dec. 2017 due to throat closing   Type II diabetes mellitus (HCC)    before the gastric bypass (04/26/2016) states she no longer has diabetes    Past Surgical History:  Procedure Laterality Date   BALLOON DILATION N/A 05/27/2016   Procedure: BALLOON DILATION;  Surgeon: Victory LITTIE Legrand DOUGLAS, MD;  Location: [facility];  Service: Gastroenterology;  Laterality: N/A;   BALLOON DILATION N/A 04/21/2017   Procedure: BALLOON DILATION;  Surgeon: Legrand Victory LITTIE DOUGLAS, MD;  Location: [facility]COPY;  Service: Gastroenterology;  Laterality: N/A;   CERVICAL CONE BIOPSY     CESAREAN SECTION  1993; 1996; 1998; 2014   CESAREAN SECTION WITH BILATERAL TUBAL LIGATION Bilateral 10/28/2012   Procedure: Repeat cesarean section with delivery of baby boy at 1210. Apgars 8/9.  BILATERAL TUBAL LIGATION;  Surgeon: Vonn VEAR Inch, MD;  Location: [facility];  Service: Obstetrics;  Laterality: Bilateral;   DILATION AND CURETTAGE OF UTERUS     ESOPHAGOGASTRODUODENOSCOPY N/A 05/27/2016   Procedure: ESOPHAGOGASTRODUODENOSCOPY (EGD);  Surgeon: Victory LITTIE Legrand DOUGLAS, MD;  Location: [facility];  Service: Gastroenterology;  Laterality: N/A;   ESOPHAGOGASTRODUODENOSCOPY (EGD) WITH PROPOFOL  N/A 04/23/2016   Procedure: ESOPHAGOGASTRODUODENOSCOPY (EGD) WITH PROPOFOL ;  Surgeon: Victory LITTIE Legrand DOUGLAS, MD;  Location: [facility];  Service: Endoscopy;  Laterality: N/A;   ESOPHAGOGASTRODUODENOSCOPY (EGD) WITH PROPOFOL  N/A 01/03/2017   Procedure: ESOPHAGOGASTRODUODENOSCOPY (EGD) WITH PROPOFOL ;  Surgeon: Legrand Victory LITTIE DOUGLAS, MD;  Location: [facility];  Service: Gastroenterology;  Laterality: N/A;    ESOPHAGOGASTRODUODENOSCOPY (EGD) WITH PROPOFOL  N/A 04/21/2017   Procedure: ESOPHAGOGASTRODUODENOSCOPY (EGD) WITH PROPOFOL ;  Surgeon: Legrand Victory LITTIE DOUGLAS, MD;  Location[facility] END[facility]COPY;  Service: Gastroenterology;  Laterality: N/A;   ESOPHAGOGASTRODUODENOSCOPY (EGD) WITH PROPOFOL  N/A 05/02/2019   Procedure: ESOPHAGOGASTRODUODENOSCOPY (EGD) WITH PROPOFOL ;  Surgeon: Abran Norleen SAILOR, MD;  Location: [facility] ENDOSCOPY;  Service: Endoscopy;  Laterality: N/A;   GASTROSTOMY N/A 08/16/2016   Procedure: LAPAROSCOPIC INSERTION OF GASTROSTOMY TUBE IN REMNANT STOMACH;  Surgeon: Herlene Righter Kinsinger, MD;  Location: [facility];  Service: General;  Laterality: N/A;   GASTROSTOMY N/A 05/16/2017   Procedure: Replacement of  GASTROSTOMY TUBE;  Surgeon: Stevie Herlene Righter,  MD;  Location: [facility];  Service: General;  Laterality: N/A;   IR CM INJ ANY COLONIC TUBE W/FLUORO  05/15/2019   LAPAROSCOPIC INSERTION GASTROSTOMY TUBE Left 05/04/2019   Procedure: LAPAROSCOPIC INSERTION GASTROSTOMY TUBE;  Surgeon: Stevie Herlene Righter, MD;  Location: [facility];  Service: General;  Laterality: Left;   LAPAROSCOPIC REVISION OF GASTROJEJUNOSTOMY Left 05/16/2017   Procedure: LAPAROSCOPIC REVISION OF GASTROJEJUNOSTOMY;  Surgeon: Stevie Herlene Righter, MD;  Location: [facility];  Service: General;  Laterality: Left;   LAPAROSCOPIC REVISION OF ROUXENY WITH UPPER ENDOSCOPY N/A 08/13/2019   Procedure: LAPAROSCOPIC REVISION OF ROUX EN Y WITH UPPER ENDOSCOPY; REMOVAL GASTROSTOMY TUBE, TAKEDOWN OF GASTROJEJUNOSTOMY; RESECTION OF SMALL INTESTINE ERAS PATHWAY;  Surgeon: Kinsinger, Herlene Righter, MD;  Location: [facility];  Service: General;  Laterality: N/A;   ROUX-EN-Y GASTRIC BYPASS  2007   TUBAL LIGATION  2014    Current Medications: Current Facility-Administered Medications  Medication Dose Route Frequency Provider Last Rate Last Admin   acetaminophen  (TYLENOL ) tablet 650 mg  650 mg Oral Q6H PRN McLauchlin, Angela, NP   650 mg at 12/10/23 1708   alum & mag  hydroxide-simeth (MAALOX/MYLANTA) 200-200-20 MG/5ML suspension 30 mL  30 mL Oral Q4H PRN McLauchlin, Angela, NP       dicyclomine  (BENTYL ) tablet 20 mg  20 mg Oral Q6H PRN Maegan Buller, MD       haloperidol  (HALDOL ) tablet 5 mg  5 mg Oral TID PRN McLauchlin, Angela, NP       And   diphenhydrAMINE  (BENADRYL ) capsule 50 mg  50 mg Oral TID PRN McLauchlin, Angela, NP       haloperidol  lactate (HALDOL ) injection 5 mg  5 mg Intramuscular TID PRN McLauchlin, Angela, NP       And   diphenhydrAMINE  (BENADRYL ) injection 50 mg  50 mg Intramuscular TID PRN McLauchlin, Angela, NP       And   LORazepam  (ATIVAN ) injection 2 mg  2 mg Intramuscular TID PRN McLauchlin, Angela, NP       haloperidol  lactate (HALDOL ) injection 10 mg  10 mg Intramuscular TID PRN McLauchlin, Angela, NP       And   diphenhydrAMINE  (BENADRYL ) injection 50 mg  50 mg Intramuscular TID PRN McLauchlin, Angela, NP       And   LORazepam  (ATIVAN ) injection 2 mg  2 mg Intramuscular TID PRN McLauchlin, Angela, NP       DULoxetine  (CYMBALTA ) DR capsule 30 mg  30 mg Oral BID Jocabed Cheese, MD   30 mg at 12/12/23 1037   feeding supplement (ENSURE PLUS HIGH PROTEIN) liquid 237 mL  237 mL Oral BID BM Markon Jares, MD       hydrOXYzine  (ATARAX ) tablet 50 mg  50 mg Oral TID PRN Shuree Brossart, MD   50 mg at 12/10/23 2304   lidocaine  (LIDODERM ) 5 % 1 patch  1 patch Transdermal Q24H Donnelly Mellow, MD   1 patch at 12/12/23 1037   lip balm (CARMEX) ointment   Topical PRN Geoffrey Hynes, MD   1 Application at 12/11/23 1608   loperamide  (IMODIUM ) capsule 2-4 mg  2-4 mg Oral PRN Akeen Ledyard, MD       magnesium  hydroxide (MILK OF MAGNESIA) suspension 30 mL  30 mL Oral Daily PRN McLauchlin, Angela, NP       methocarbamol  (ROBAXIN ) tablet 500 mg  500 mg Oral Q8H PRN Abraham Entwistle, MD       multivitamin with minerals tablet 1 tablet  1 tablet Oral Daily  Bohden Dung, MD   1 tablet at 12/12/23 1037   naproxen  (NAPROSYN ) tablet 500 mg  500  mg Oral BID PRN Clemma Johnsen, MD       ondansetron  (ZOFRAN -ODT) disintegrating tablet 4 mg  4 mg Oral Q6H PRN Vinnie Gombert, MD   4 mg at 12/11/23 1623   traZODone  (DESYREL ) tablet 100 mg  100 mg Oral QHS PRN Armoni Kludt, MD   100 mg at 12/11/23 2123    Lab Results:  No results found for this or any previous visit (from the past 48 hours).   Blood Alcohol level:  Lab Results  Component Value Date   [facility] <15 12/07/2023   ETH <10 05/12/2019    Metabolic Disorder Labs: Lab Results  Component Value Date   HGBA1C 5.6 10/01/2022   MPG 114.02 10/01/2022   MPG 88 08/06/2019   No results found for: PROLACTIN Lab Results  Component Value Date   CHOL 168 12/07/2023   TRIG 187 (H) 12/07/2023   HDL 69 12/07/2023   CHOLHDL 2.4 12/07/2023   VLDL 37 12/07/2023   LDLCALC 62 12/07/2023   LDLCALC  01/22/2007    47        Total Cholesterol/HDL:CHD Risk Coronary Heart Disease Risk Table                     Men   Women  1/2 Average Risk   3.4   3.3    Physical Findings: AIMS:  , ,  ,  ,    CIWA:    COWS:  COWS Total Score: 0   Psychiatric Specialty Exam:  Presentation  General Appearance:  Stated age Eye Contact: Fair Speech: Normal Speech Volume: Normal    Mood and Affect  Mood: Fine Affect: Congruent   Thought Process  Thought Processes: Coherent  Descriptions of Associations:Intact  Orientation:Full (Time, Place and Person)  Thought Content: Logical Hallucinations: Denies Ideas of Reference: Denies Suicidal Thoughts: Denies Homicidal Thoughts: Denies  Sensorium  Memory: Fair Judgment: Improved Insight: Improved  Art therapist  Concentration: Fair  Attention Span: Fair  Recall: [employer] of Knowledge: Poor  Language: Fair   Psychomotor Activity  Psychomotor Activity:No data recorded Musculoskeletal: Strength & Muscle Tone: within normal limits Gait & Station: normal Assets  Assets: Resilience    Physical  Exam: Physical Exam Vitals and nursing note reviewed.    ROS Blood pressure 97/74, pulse (!) 103, temperature (!) 97.3 F (36.3 C), resp. rate 16, height 5' (1.524 m), weight 51.5 kg, SpO2 99%. Body mass index is 22.17 kg/m.  Diagnosis: Principal Problem:   MDD (major depressive disorder) Active Problems:   Schizophrenia (HCC)   PLAN: Safety and Monitoring:  -- Voluntary admission to inpatient psychiatric unit for safety, stabilization and treatment  -- Daily contact with patient to assess and evaluate symptoms and progress in treatment  -- Patient's case to be discussed in multi-disciplinary team meeting  -- Observation Level : q15 minute checks  -- Vital signs:  q12 hours  -- Precautions: suicide, elopement, and assault -- Encouraged patient to participate in unit milieu and in scheduled group therapies  2. Psychiatric Diagnoses and Treatment:    Clonidine  detox protocol was discontinued as patient is more stable from the withdrawals Cymbalta  30 mg BID     3. Medical Issues Being Addressed:   Opioid use d/o  4. Discharge Planning:   -- Social work and case management to assist with discharge planning and identification of hospital  follow-up needs prior to discharge  -- Estimated LOS: 3-4 days  Madlyn Crosby, MD 12/12/2023, 4:21 PM

## 2023-12-12 NOTE — BHH Counselor (Signed)
 CSW contacted pt's counselor at the [facility], [facility], [phone] to make a hospital follow-up  Gladis reports he is NOT pt's counselor and he is a peer support specialist.m  Gladis reports CSW needs to contact [facility] Desk: [phone] ext [phone] or Cell: [phone]  Lum Croft, MSW, [facility] 12/12/2023 3:53 PM

## 2023-12-12 NOTE — Group Note (Signed)
 Date:  12/12/2023 Time:  6:34 PM  Group Topic/Focus:  Managing Feelings:   The focus of this group is to identify what feelings patients have difficulty handling and develop a plan to handle them in a healthier way upon discharge.    Participation Level:  Did Not Attend   Harlene LITTIE Gavel 12/12/2023, 6:34 PM

## 2023-12-12 NOTE — Group Note (Signed)
 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 Patients and LRT discuss what it means to be "at peace", what it feels like physically and mentally. Pts are given a canvas and watercolor paint to use and encouraged to draw their idea of a peaceful place. Pts and LRT discuss how they use this in their daily life post discharge. Pts are encouraged to take their canvas home with them as a reminder to find their peaceful place whenever they are feeling depressed, anxious, etc.    Goal Area(s) Addressed:  Patient will identify what it means to experience a "peaceful" emotion. Patient will identify a new coping skill.  Patient will express their emotions through art. Patients will increase communication by talking with LRT and peers while in group.   Affect/Mood: N/A   Participation Level: Did not attend    Clinical Observations/Individualized Feedback: Patient did not attend group.   Plan: Continue to engage patient in RT group sessions 2-3x/week.   Jeoffrey BRAVO Celestia, LRT, CTRS 12/12/2023 5:32 PM

## 2023-12-12 NOTE — Progress Notes (Signed)
   12/11/23 2100  Psych Admission Type (Psych Patients Only)  Admission Status Voluntary  Psychosocial Assessment  Patient Complaints Depression  Eye Contact Fair  Facial Expression Sad  Affect Sad;Flat  Speech Logical/coherent  Interaction Minimal  Motor Activity Slow  Appearance/Hygiene In scrubs  Behavior Characteristics Cooperative;Appropriate to situation  Mood Depressed  Thought Process  Coherency WDL  Content WDL  Delusions None reported or observed  Perception WDL  Hallucination None reported or observed  Judgment Impaired  Confusion None  Danger to Self  Current suicidal ideation? Denies  Self-Injurious Behavior No self-injurious ideation or behavior indicators observed or expressed   Agreement Not to Harm Self No  Description of Agreement verbal  Danger to Others  Danger to Others None reported or observed  Danger to Others Abnormal  Harmful Behavior to others No threats or harm toward other people  Destructive Behavior No threats or harm toward property

## 2023-12-13 DIAGNOSIS — F209 Schizophrenia, unspecified: Secondary | ICD-10-CM | POA: Diagnosis not present

## 2023-12-13 DIAGNOSIS — F332 Major depressive disorder, recurrent severe without psychotic features: Secondary | ICD-10-CM | POA: Diagnosis not present

## 2023-12-13 MED ORDER — DULOXETINE HCL 30 MG PO CPEP: 30.0000 mg | ORAL_CAPSULE | Freq: Two times a day (BID) | ORAL | 0 refills | Status: AC

## 2023-12-13 NOTE — Progress Notes (Signed)

## 2023-12-13 NOTE — Discharge Summary (Signed)
 Physician Discharge Summary Note  Patient:  Angie [name] is an 58 y.o., female MRN:  [id number] DOB:  [birth year] Patient phone:  [phone] (home)  Patient address:   [address] [place] [address],   Total time spent: 40 min Date of Admission:  12/07/2023 Date of Discharge: 12/13/23  Reason for Admission:  [name] is a 58 year old female with PMH of MDD, GAD, PTSD, bipolar disorder, MST, opioid use disorder, alcohol use disorder, chronic pain syndrome, fibromyalgia, DM, HTN, and osteoarthritis. She presents to the inpatient psych unit for suicidal ideations and homicidal ideations. Patient has a history of 2 suicide attempts in the past, one in 2006 by overdosing on Tylenol  when she was pregnant with her son the other in 2005 unknown method. She required hospitalization for both.   Principal Problem: MDD (major depressive disorder) Discharge Diagnoses: Principal Problem:   MDD (major depressive disorder) Active Problems:   Schizophrenia (HCC)   Past Psychiatric History: see h&p  Family Psychiatric  History: see h&p Social History:  Social History   Substance and Sexual Activity  Alcohol Use Not Currently   Alcohol/week: 17.0 standard drinks of alcohol   Types: 6 Glasses of wine, 11 Shots of liquor per week   Comment: occ     Social History   Substance and Sexual Activity  Drug Use No    Social History   Socioeconomic History   Marital status: Widowed    Spouse name: Not on file   Number of children: 4   Years of education: Not on file   Highest education level: Not on file  Occupational History   Not on file  Tobacco Use   Smoking status: Former    Current packs/day: 0.00    Average packs/day: 0.5 packs/day for 4.0 years (2.0 ttl pk-yrs)    Types: Cigarettes    Start date: 03/16/2008    Quit date: 03/16/2012    Years since quitting: 11.7   Smokeless tobacco: Never  Vaping Use   Vaping status: Never Used  Substance and Sexual  Activity   Alcohol use: Not Currently    Alcohol/week: 17.0 standard drinks of alcohol    Types: 6 Glasses of wine, 11 Shots of liquor per week    Comment: occ   Drug use: No   Sexual activity: Yes    Birth control/protection: None, Surgical  Other Topics Concern   Not on file  Social History Narrative   Not on file   Social Drivers of Health   Financial Resource Strain: Not on file  Food Insecurity: No Food Insecurity (12/08/2023)   Hunger Vital Sign    Worried About Running Out of Food in the Last Year: Never true    Ran Out of Food in the Last Year: Never true  Transportation Needs: No Transportation Needs (12/08/2023)   PRAPARE - Administrator, Civil Service (Medical): No    Lack of Transportation (Non-Medical): No  Physical Activity: Not on file  Stress: Not on file  Social Connections: Unknown (09/08/2021)   [facility]   Social Network    Social Network: Not on file   Past Medical History:  Past Medical History:  Diagnosis Date   Abnormal Pap smear    Alcohol abuse    Anemia    IDA   Anxiety    Takes xanax    Arthritis    left hip/knees, lower spine   Bilateral lower extremity edema  Bipolar disorder (HCC)    Blood transfusion without reported diagnosis last done 04-25-17   Bulging lumbar disc    Bursitis    left shoulder   Chronic lower back pain    Cut    right middle finger with knife small cut healing pt instructed to keep clean and dry no redness or drainage   Depression    Diverticulitis    Fibromyalgia    Gastrointestinal tube present (HCC)    GERD (gastroesophageal reflux disease)    Hypertension    none since weight loss, no medications at this time   Hypoglycemia    Lactose intolerance in adult 2017   Migraines    weekly @ least (04/26/2016)   Seizures (HCC)    one Dec. 2017 due to throat closing   Type II diabetes mellitus (HCC)    before the gastric bypass (04/26/2016) states she no longer has diabetes     Past Surgical History:  Procedure Laterality Date   BALLOON DILATION N/A 05/27/2016   Procedure: BALLOON DILATION;  Surgeon: Victory LITTIE Legrand DOUGLAS, MD;  Location: [facility];  Service: Gastroenterology;  Laterality: N/A;   BALLOON DILATION N/A 04/21/2017   Procedure: BALLOON DILATION;  Surgeon: Legrand Victory LITTIE DOUGLAS, MD;  Location: [facility];  Service: Gastroenterology;  Laterality: N/A;   CERVICAL CONE BIOPSY     CESAREAN SECTION  1993; 1996; 1998; 2014   CESAREAN SECTION WITH BILATERAL TUBAL LIGATION Bilateral 10/28/2012   Procedure: Repeat cesarean section with delivery of baby boy at 1210. Apgars 8/9.  BILATERAL TUBAL LIGATION;  Surgeon: Vonn VEAR Inch, MD;  Location: [facility];  Service: Obstetrics;  Laterality: Bilateral;   DILATION AND CURETTAGE OF UTERUS     ESOPHAGOGASTRODUODENOSCOPY N/A 05/27/2016   Procedure: ESOPHAGOGASTRODUODENOSCOPY (EGD);  Surgeon: Victory LITTIE Legrand DOUGLAS, MD;  Location: [facility];  Service: Gastroenterology;  Laterality: N/A;   ESOPHAGOGASTRODUODENOSCOPY (EGD) WITH PROPOFOL  N/A 04/23/2016   Procedure: ESOPHAGOGASTRODUODENOSCOPY (EGD) WITH PROPOFOL ;  Surgeon: Victory LITTIE Legrand DOUGLAS, MD;  Location: [facility];  Service: Endoscopy;  Laterality: N/A;   ESOPHAGOGASTRODUODENOSCOPY (EGD) WITH PROPOFOL  N/A 01/03/2017   Procedure: ESOPHAGOGASTRODUODENOSCOPY (EGD) WITH PROPOFOL ;  Surgeon: Legrand Victory LITTIE DOUGLAS, MD;  Location: [facility];  Service: Gastroenterology;  Laterality: N/A;   ESOPHAGOGASTRODUODENOSCOPY (EGD) WITH PROPOFOL  N/A 04/21/2017   Procedure: ESOPHAGOGASTRODUODENOSCOPY (EGD) WITH PROPOFOL ;  Surgeon: Legrand Victory LITTIE DOUGLAS, MD;  Location: [facility]COPY;  Service: Gastroenterology;  Laterality: N/A;   ESOPHAGOGASTRODUODENOSCOPY (EGD) WITH PROPOFOL  N/A 05/02/2019   Procedure: ESOPHAGOGASTRODUODENOSCOPY (EGD) WITH PROPOFOL ;  Surgeon: Abran Norleen SAILOR, MD;  Location: [facility] ENDOSCOPY;  Service: Endoscopy;  Laterality: N/A;   GASTROSTOMY N/A 08/16/2016   Procedure: LAPAROSCOPIC INSERTION OF  GASTROSTOMY TUBE IN REMNANT STOMACH;  Surgeon: Herlene Righter Kinsinger, MD;  Location: [facility];  Service: General;  Laterality: N/A;   GASTROSTOMY N/A 05/16/2017   Procedure: Replacement of  GASTROSTOMY TUBE;  Surgeon: Stevie Herlene Righter, MD;  Location: [facility];  Service: General;  Laterality: N/A;   IR CM INJ ANY COLONIC TUBE W/FLUORO  05/15/2019   LAPAROSCOPIC INSERTION GASTROSTOMY TUBE Left 05/04/2019   Procedure: LAPAROSCOPIC INSERTION GASTROSTOMY TUBE;  Surgeon: Stevie Herlene Righter, MD;  Location: [facility];  Service: General;  Laterality: Left;   LAPAROSCOPIC REVISION OF GASTROJEJUNOSTOMY Left 05/16/2017   Procedure: LAPAROSCOPIC REVISION OF GASTROJEJUNOSTOMY;  Surgeon: Stevie Herlene Righter, MD;  Location: [facility];  Service: General;  Laterality: Left;   LAPAROSCOPIC REVISION OF ROUXENY WITH UPPER ENDOSCOPY N/A 08/13/2019   Procedure: LAPAROSCOPIC REVISION OF  ROUX EN Y WITH UPPER ENDOSCOPY; REMOVAL GASTROSTOMY TUBE, TAKEDOWN OF GASTROJEJUNOSTOMY; RESECTION OF SMALL INTESTINE ERAS PATHWAY;  Surgeon: Kinsinger, Herlene Righter, MD;  Location: [facility];  Service: General;  Laterality: N/A;   ROUX-EN-Y GASTRIC BYPASS  2007   TUBAL LIGATION  2014   Family History:  Family History  Problem Relation Age of Onset   Diabetes Father    Heart disease Father    Depression Maternal Grandmother    Heart disease Maternal Grandfather    Depression Paternal Grandmother    Colon cancer Paternal Grandfather    Pancreatic cancer Paternal Grandfather     Hospital Course:  Calli is a 58 year old female with PMH of MDD, GAD, PTSD, bipolar disorder, MST, opioid use disorder, alcohol use disorder, chronic pain syndrome, fibromyalgia, DM, HTN, and osteoarthritis. She presents to the inpatient psych unit for suicidal ideations and homicidal ideations. Patient has a history of 2 suicide attempts in the past, one in 2006 by overdosing on Tylenol  when she was pregnant with her son the other in 2005 unknown method. She required  hospitalization for both.  Detailed risk assessment is complete based on clinical exam and individual risk factors and acute suicide risk is low and acute violence risk is low.   Medication patient was withdrawing from polysubstance use including opiates.  Patient was managed on detox protocol.  Cymbalta  30 mg twice daily was initiated and patient tolerated the medication very well with no reported side effects.  On the day of discharge she consistently denies SI/HI/plan and denies hallucinations.  She remains future oriented and is willing to possibly in outpatient mental health services   Currently, all modifiable risk of harm to self/harm to others have been addressed and patient is no longer appropriate for the acute inpatient setting and is able to continue treatment for mental health needs in the community with the supports as indicated below.  Patient is educated and verbalized understanding of discharge plan of care including medications, follow-up appointments, mental health resources and further crisis services in the community.  He is instructed to call 911 or present to the nearest emergency room should he experience any decompensation in mood, disturbance of bowel or return of suicidal/homicidal ideations.  Patient verbalizes understanding of this education and agrees to this plan of care  Physical Findings: AIMS:  , ,  ,  ,    CIWA:    COWS:  COWS Total Score: 2     Psychiatric Specialty Exam:  Presentation  General Appearance:  Appropriate for Environment; Casual  Eye Contact: Fair  Speech: Clear and Coherent  Speech Volume: Normal    Mood and Affect  Mood: Euthymic  Affect: Appropriate   Thought Process  Thought Processes: Coherent  Descriptions of Associations:Intact  Orientation:Full (Time, Place and Person)  Thought Content:Logical  Hallucinations:Hallucinations: None  Ideas of Reference:None  Suicidal Thoughts:Suicidal Thoughts: No  Homicidal  Thoughts:Homicidal Thoughts: No   Sensorium  Memory: Immediate Fair; Recent Fair; Remote Fair  Judgment: Fair  Insight: Fair   Art therapist  Concentration: Fair  Attention Span: Fair  Recall: [employer] of Knowledge: Fair  Language: Fair   Psychomotor Activity  Psychomotor Activity: Psychomotor Activity: Normal  Musculoskeletal: Strength & Muscle Tone: within normal limits Gait & Station: normal Assets  Assets: Manufacturing systems engineer; Desire for Improvement; Social Support; Resilience   Sleep  Sleep: Sleep: Fair    Physical Exam: Physical Exam ROS Blood pressure (!) 133/96, pulse 91, temperature 98.1 F (36.7 C),  resp. rate 18, height 5' (1.524 m), weight 51.5 kg, SpO2 98%. Body mass index is 22.17 kg/m.   Social History   Tobacco Use  Smoking Status Former   Current packs/day: 0.00   Average packs/day: 0.5 packs/day for 4.0 years (2.0 ttl pk-yrs)   Types: Cigarettes   Start date: 03/16/2008   Quit date: 03/16/2012   Years since quitting: 11.7  Smokeless Tobacco Never   Tobacco Cessation:  N/A, patient does not currently use tobacco products   Blood Alcohol level:  Lab Results  Component Value Date   [facility] <15 12/07/2023   ETH <10 05/12/2019    Metabolic Disorder Labs:  Lab Results  Component Value Date   HGBA1C 5.6 10/01/2022   MPG 114.02 10/01/2022   MPG 88 08/06/2019   No results found for: PROLACTIN Lab Results  Component Value Date   CHOL 168 12/07/2023   TRIG 187 (H) 12/07/2023   HDL 69 12/07/2023   CHOLHDL 2.4 12/07/2023   VLDL 37 12/07/2023   LDLCALC 62 12/07/2023   LDLCALC  01/22/2007    47        Total Cholesterol/HDL:CHD Risk Coronary Heart Disease Risk Table                     Men   Women  1/2 Average Risk   3.4   3.3    See Psychiatric Specialty Exam and Suicide Risk Assessment completed by Attending Physician prior to discharge.  Discharge destination:  Home  Is patient on multiple  antipsychotic therapies at discharge:  No   Has Patient had three or more failed trials of antipsychotic monotherapy by history:  No  Recommended Plan for Multiple Antipsychotic Therapies: NA  Discharge Instructions     Diet - low sodium heart healthy   Complete by: As directed    Increase activity slowly   Complete by: As directed       Allergies as of 12/13/2023       Reactions   Iron  Anaphylaxis, Itching   Had acute allergy with tachycardia, attention and generalized itching shortly after receiving nulecit ( iron  gluconate) infusion   Lactose Intolerance (gi) Other (See Comments)   GI upset   Other Other (See Comments)   Seasonal allergies - stuffy nose        Medication List     STOP taking these medications    magnesium  30 MG tablet   multivitamin with minerals Tabs tablet   Potassium 99 MG Tabs   QUEtiapine  100 MG tablet Commonly known as: SEROQUEL        TAKE these medications      Indication  DULoxetine  30 MG capsule Commonly known as: CYMBALTA  Take 1 capsule (30 mg total) by mouth 2 (two) times daily.    lidocaine  5 % ointment Commonly known as: XYLOCAINE  Apply 1 Application topically 2 (two) times daily as needed (For pain).    naloxone  4 MG/0.1ML Liqd nasal spray kit Commonly known as: NARCAN  Place 1 spray into the nose Once PRN (For opioid overdose).  Indication: Opioid Overdose   Vicks Sinex Severe 0.05 % nasal spray Generic drug: oxymetazoline  Place 1-2 sprays into both nostrils daily.         Follow-up Information     [facility]. Go on 12/13/2023.   Why: Although you declined allowing me to make a follow-up for [facility], I have contacted your SA counselor per your request. As you have stated, you will call him  after you discharge from the hospital. Contact information: [address] [place]  [address] [phone]                Follow-up recommendations:  Activity:  As  tolerated    Signed: Ziara Thelander, MD 12/13/2023, 9:10 AM

## 2023-12-13 NOTE — Progress Notes (Signed)
  [facility] Adult Case Management Discharge Plan :  Will you be returning to the same living situation after discharge:  Yes,  pt will return home  At discharge, do you have transportation home?: Yes,  pt reports her husband will pick her up  Do you have the ability to pay for your medications: Yes,  [employer]IC[employer] / Angie [name]  Release of information consent forms completed and in the chart;  Patient's signature needed at discharge.  Patient to Follow up at:  Follow-up Information     [facility]. Go on 12/13/2023.   Why: Although you declined allowing me to make a follow-up for [facility], I have contacted your SA counselor per your request. As you have stated, you will call him after you discharge from the hospital. Contact information: [address] [place]  [address] [phone]                Next level of care provider has access to [facility] Link:no  Safety Planning and Suicide Prevention discussed: Angie [name], daughter, [phone]     Has patient been referred to the Quitline?: Patient refused referral for treatment  Patient has been referred for addiction treatment: Patient refused referral for treatment; referral information given to patient at discharge.  [address] LCSWA 12/13/2023, 9:03 AM

## 2023-12-13 NOTE — Progress Notes (Signed)
 Pt is irritable and agitated. Verbally aggressive. Pt is demanding and threatening to leave the unit and discharge. Pt said I have to pay my rent and get back to my son! says I don't trust his father he's mean. Support and reassurance provided. Verbal de escalation provided. AC made aware. Trazodone  100 mg po given PRN at 0200 for insomnia. Tol well. Denies SI/HI/AVH. Q 15 min checks maintained for safety. Resting quietly in bed.    12/12/23 2100  Psych Admission Type (Psych Patients Only)  Admission Status Voluntary  Psychosocial Assessment  Patient Complaints Anxiety;Agitation  Eye Contact Intense  Facial Expression Animated  Affect Irritable  Speech Aggressive  Interaction Demanding  Motor Activity Slow  Appearance/Hygiene In scrubs  Behavior Characteristics Agitated;Irritable  Mood Preoccupied  Thought Process  Coherency WDL  Content WDL  Delusions None reported or observed  Perception WDL  Hallucination None reported or observed  Judgment Impaired  Confusion None  Danger to Self  Current suicidal ideation? Denies

## 2023-12-13 NOTE — Plan of Care (Signed)
  Problem: Education: Goal: Utilization of techniques to improve thought processes will improve Outcome: Progressing Goal: Knowledge of the prescribed therapeutic regimen will improve Outcome: Progressing   Problem: Activity: Goal: Interest or engagement in leisure activities will improve Outcome: Progressing Goal: Imbalance in normal sleep/wake cycle will improve Outcome: Progressing   Problem: Coping: Goal: Coping ability will improve Outcome: Progressing Goal: Will verbalize feelings Outcome: Progressing   Problem: Health Behavior/Discharge Planning: Goal: Ability to make decisions will improve Outcome: Progressing Goal: Compliance with therapeutic regimen will improve Outcome: Progressing   

## 2023-12-13 NOTE — Progress Notes (Signed)
   12/13/23 0930  Spiritual Encounters  Type of Visit Initial  Care provided to: Patient  Referral source Patient request  Reason for visit Routine spiritual support  OnCall Visit No   While rounding I met briefly with Ms. Harrington as she prepared to discharge. She shared several prayer requests. I affirmed her faith and offered a commitment to lift her concerns in prayer as well as words of encouragement.  Ricquel Foulk L. Fredrica, M.Div

## 2023-12-13 NOTE — Group Note (Signed)
 Date:  12/13/2023 Time:  12:35 AM  Group Topic/Focus:  Wrap-Up Group:   The focus of this group is to help patients review their daily goal of treatment and discuss progress on daily workbooks.    Participation Level:  Active  Participation Quality:  Appropriate  Affect:  Appropriate  Cognitive:  Alert  Insight: Appropriate  Engagement in Group:  Engaged  Modes of Intervention:  Discussion  Additional Comments:    Angie [name] Angie [name] 12/13/2023, 12:35 AM

## 2023-12-13 NOTE — Care Management Important Message (Signed)
 Important Message  Patient Details  Name: Angie [name] MRN: [id number] Date of Birth: 1965-11-26   Important Message Given:  Yes - Medicare IM     Lum [name], LCSWA 12/13/2023, 9:05 AM

## 2023-12-13 NOTE — [facility] Suicide Risk Assessment (Signed)
 [facility] Discharge Suicide Risk Assessment   Principal Problem: MDD (major depressive disorder) Discharge Diagnoses: Principal Problem:   MDD (major depressive disorder) Active Problems:   Schizophrenia (HCC)   Total Time spent with patient: 30 minutes  Musculoskeletal: Strength & Muscle Tone: within normal limits Gait & Station: normal Patient leans: N/A  Psychiatric Specialty Exam  Presentation  General Appearance:  Appropriate for Environment; Casual  Eye Contact: Fair  Speech: Clear and Coherent  Speech Volume: Normal  Handedness: Right   Mood and Affect  Mood: Euthymic  Duration of Depression Symptoms: Greater than two weeks  Affect: Appropriate   Thought Process  Thought Processes: Coherent  Descriptions of Associations:Intact  Orientation:Full (Time, Place and Person)  Thought Content:Logical  History of Schizophrenia/Schizoaffective disorder:No  Duration of Psychotic Symptoms:No data recorded Hallucinations:Hallucinations: None  Ideas of Reference:None  Suicidal Thoughts:Suicidal Thoughts: No  Homicidal Thoughts:Homicidal Thoughts: No   Sensorium  Memory: Immediate Fair; Recent Fair; Remote Fair  Judgment: Fair  Insight: Fair   Art therapist  Concentration: Fair  Attention Span: Fair  Recall: [employer] of Knowledge: Fair  Language: Fair   Psychomotor Activity  Psychomotor Activity: Psychomotor Activity: Normal   Assets  Assets: Communication Skills; Desire for Improvement; Social Support; Resilience   Sleep  Sleep: Sleep: Fair  Estimated Sleeping Duration (Last 24 Hours): 4.00-5.50 hours  Physical Exam: Physical Exam ROS Blood pressure (!) 133/96, pulse 91, temperature 98.1 F (36.7 C), resp. rate 18, height 5' (1.524 m), weight 51.5 kg, SpO2 98%. Body mass index is 22.17 kg/m.  Mental Status Per Nursing Assessment::   On Admission:  Suicide plan, Self-harm thoughts, Thoughts of violence  towards others, Plan to harm others  Demographic Factors:  Low socioeconomic status  Loss Factors: Decrease in vocational status  Historical Factors: Impulsivity  Risk Reduction Factors:   Living with another person, especially a relative, Positive social support, Positive therapeutic relationship, and Positive coping skills or problem solving skills  Continued Clinical Symptoms:  Alcohol/Substance Abuse/Dependencies  Cognitive Features That Contribute To Risk:  None    Suicide Risk:  Minimal: No identifiable suicidal ideation.  Patients presenting with no risk factors but with morbid ruminations; may be classified as minimal risk based on the severity of the depressive symptoms   Follow-up Information     [facility]. Go on 12/13/2023.   Why: Although you declined allowing me to make a follow-up for [facility], I have contacted your SA counselor per your request. As you have stated, you will call him after you discharge from the hospital. Contact information: [address] [place]  [address] [phone]                Plan Of Care/Follow-up recommendations:  Activity:  As tolerated  Allyn Foil, MD 12/13/2023, 9:09 AM

## 2024-02-02 ENCOUNTER — Encounter (HOSPITAL_COMMUNITY): Payer: Self-pay

## 2024-02-02 ENCOUNTER — Emergency Department (HOSPITAL_COMMUNITY)
Admission: EM | Admit: 2024-02-02 | Discharge: 2024-02-02 | Disposition: A | Discharge status: Home / Self Care | Attending: Emergency Medicine | Admitting: Emergency Medicine

## 2024-02-02 DIAGNOSIS — T401X1A Poisoning by heroin, accidental (unintentional), initial encounter: Secondary | ICD-10-CM | POA: Insufficient documentation

## 2024-02-02 LAB — CBC WITH DIFFERENTIAL/PLATELET
Abs Immature Granulocytes: 0.02 K/uL (ref 0.00–0.07)
Basophils Absolute: 0.1 K/uL (ref 0.0–0.1)
Basophils Relative: 1 %
Eosinophils Absolute: 0.2 K/uL (ref 0.0–0.5)
Eosinophils Relative: 3 %
HCT: 29 % — ABNORMAL LOW (ref 36.0–46.0)
Hemoglobin: 9.2 g/dL — ABNORMAL LOW (ref 12.0–15.0)
Immature Granulocytes: 0 %
Lymphocytes Relative: 10 %
Lymphs Abs: 0.6 K/uL — ABNORMAL LOW (ref 0.7–4.0)
MCH: 28.1 pg (ref 26.0–34.0)
MCHC: 31.7 g/dL (ref 30.0–36.0)
MCV: 88.7 fL (ref 80.0–100.0)
Monocytes Absolute: 0.4 K/uL (ref 0.1–1.0)
Monocytes Relative: 7 %
Neutro Abs: 4.8 K/uL (ref 1.7–7.7)
Neutrophils Relative %: 79 %
Platelets: 321 K/uL (ref 150–400)
RBC: 3.27 MIL/uL — ABNORMAL LOW (ref 3.87–5.11)
RDW: 13.7 % (ref 11.5–15.5)
WBC: 6.1 K/uL (ref 4.0–10.5)
nRBC: 0 % (ref 0.0–0.2)

## 2024-02-02 LAB — COMPREHENSIVE METABOLIC PANEL WITH GFR
ALT: 12 U/L (ref 0–44)
AST: 15 U/L (ref 15–41)
Albumin: 3.6 g/dL (ref 3.5–5.0)
Alkaline Phosphatase: 74 U/L (ref 38–126)
Anion gap: 13 (ref 5–15)
BUN: 14 mg/dL (ref 6–20)
CO2: 21 mmol/L — ABNORMAL LOW (ref 22–32)
Calcium: 9.1 mg/dL (ref 8.9–10.3)
Chloride: 103 mmol/L (ref 98–111)
Creatinine, Ser: 0.76 mg/dL (ref 0.44–1.00)
GFR, Estimated: >60 mL/min (ref >60)
Glucose, Bld: 105 mg/dL — ABNORMAL HIGH (ref 70–99)
Potassium: 3.5 mmol/L (ref 3.5–5.1)
Sodium: 137 mmol/L (ref 135–145)
Total Bilirubin: 0.4 mg/dL (ref 0.0–1.2)
Total Protein: 6.9 g/dL (ref 6.5–8.1)

## 2024-02-02 LAB — ACETAMINOPHEN LEVEL: Acetaminophen (Tylenol), Serum: <10 ug/mL — ABNORMAL LOW (ref 10–30)

## 2024-02-02 LAB — CBG MONITORING, ED: Glucose-Capillary: 100 mg/dL — ABNORMAL HIGH (ref 70–99)

## 2024-02-02 LAB — ETHANOL: Alcohol, Ethyl (B): <15 mg/dL (ref <15)

## 2024-02-02 MED ORDER — SODIUM CHLORIDE 0.9 % IV BOLUS
1000.0000 mL | Freq: Once | INTRAVENOUS | Status: AC
  Administered 2024-02-02: 1000 mL via INTRAVENOUS

## 2024-02-02 NOTE — ED Notes (Addendum)
 Pt reminded urine sample is needed. Pt requesting to walk to bathroom, NT and RN offered bedpan with concern for pt safety. Pt unable to provide urine sample without contaminating sample at this time. RN and MD notified.

## 2024-02-02 NOTE — ED Triage Notes (Signed)
 PT was found by family at home, called for heroine overdose. Family did not speak with ems on details. Family gave narcan  16mg  and pt was still lethargic to EMS. Pt still lethargic but uncooperative in route.

## 2024-02-02 NOTE — Discharge Instructions (Signed)
 You were evaluated in the emergency department for accidental opioid overdose.  Please utilize Narcan  at home and avoid IV opioid use.  Please return to the ED if you experience new or worsening symptoms.

## 2024-02-02 NOTE — ED Provider Notes (Signed)
 [name] EMERGENCY DEPARTMENT AT [facility] Provider Note   CSN: [id number] Arrival date & time: 02/02/24  1932     Patient presents with: Drug Overdose   Angie [name] is a 58 y.o. female.   Drug Overdose   Patient is a 58 year old female PMH of MDD, GAD, PTSD, bipolar disorder, EtOH use disorder, chronic pain syndrome, and IVDU presenting to the ED via EMS after reported over overdose of fentanyl  -per PD, patient reportedly found down, given 16 mg intranasal Narcan  in the prehospital setting.    On chart review, patient recently hospitalized 08/13 - 12/13/2023 for MDD, and patient noted to be admitted to inpatient psych unit for suicidal ideations and homicidal ideations with 2 prior suicide attempts in the past, 1 in 2006 by overdosing on Tylenol  when she was pregnant with her son and the other in 2005 unknown method.  Prior to Admission medications   Medication Sig Start Date End Date Taking? Authorizing Provider  DULoxetine  (CYMBALTA ) 30 MG capsule Take 1 capsule (30 mg total) by mouth 2 (two) times daily. 12/13/23   Donnelly Mellow, MD  lidocaine  (XYLOCAINE ) 5 % ointment Apply 1 Application topically 2 (two) times daily as needed (For pain).    [provider]  naloxone  (NARCAN ) nasal spray 4 mg/0.1 mL Place 1 spray into the nose Once PRN (For opioid overdose). Patient not taking: Reported on 12/07/2023 06/11/20   [provider]  oxymetazoline  (VICKS SINEX SEVERE) 0.05 % nasal spray Place 1-2 sprays into both nostrils daily.    [provider]    Allergies: Iron , Lactose intolerance (gi), and Other    Review of Systems  Updated Vital Signs BP (!) 140/86   Pulse 78   Temp (!) 97.5 F (36.4 C) (Oral)   Resp 20   SpO2 100%   Physical Exam Constitutional:      Comments: Somnolent, but arousable  HENT:     Head: Normocephalic and atraumatic.     Mouth/Throat:     Mouth: Mucous membranes are moist.     Pharynx: Oropharynx is  clear.  Eyes:     Extraocular Movements: Extraocular movements intact.     Pupils: Pupils are equal, round, and reactive to light.  Cardiovascular:     Rate and Rhythm: Normal rate and regular rhythm.     Pulses: Normal pulses.     Heart sounds: Normal heart sounds.  Pulmonary:     Effort: Pulmonary effort is normal.     Breath sounds: Normal breath sounds.  Abdominal:     General: Abdomen is flat.     Palpations: Abdomen is soft.  Musculoskeletal:     Cervical back: Normal range of motion.  Skin:    General: Skin is warm.     Capillary Refill: Capillary refill takes 2 to 3 seconds.  Neurological:     General: No focal deficit present.     Cranial Nerves: No cranial nerve deficit.  Psychiatric:        Behavior: Behavior is uncooperative.     (all labs ordered are listed, but only abnormal results are displayed) Labs Reviewed  CBC WITH DIFFERENTIAL/PLATELET - Abnormal; Notable for the following components:      Result Value   RBC 3.27 (*)    Hemoglobin 9.2 (*)    HCT 29.0 (*)    Lymphs Abs 0.6 (*)    All other components within normal limits  COMPREHENSIVE METABOLIC PANEL WITH GFR - Abnormal; Notable for the following  components:   CO2 21 (*)    Glucose, Bld 105 (*)    All other components within normal limits  ACETAMINOPHEN  LEVEL - Abnormal; Notable for the following components:   Acetaminophen  (Tylenol ), Serum <10 (*)    All other components within normal limits  CBG MONITORING, ED - Abnormal; Notable for the following components:   Glucose-Capillary 100 (*)    All other components within normal limits  ETHANOL  RAPID URINE DRUG SCREEN, HOSP PERFORMED    EKG: EKG Interpretation Date/Time:  Thursday February 02 2024 19:40:06 EDT Ventricular Rate:  82 PR Interval:  143 QRS Duration:  104 QT Interval:  359 QTC Calculation: 420 R Axis:   10  Text Interpretation: Sinus rhythm Anteroseptal infarct, old No significant change since last tracing Confirmed by Patt Alm DEL [phone]) on 02/02/2024 8:02:16 PM  Radiology: No results found.   Procedures   Medications Ordered in the ED  sodium chloride  0.9 % bolus 1,000 mL (1,000 mLs Intravenous New Bag/Given 02/02/24 2024)    Clinical Course as of 02/02/24 2214  Thu Feb 02, 2024  2055 Ethanol undetectable.  Acetaminophen  level less than 10. [WB]    Clinical Course User Index [WB] Bowie Doiron, Elsie, MD                                 Medical Decision Making Amount and/or Complexity of Data Reviewed Labs: ordered.   59 year old female with PMH as above presenting to the ED with concern for acute opioid intoxication in the setting of history of IVDU, drug paraphernalia found around her unresponsive, given 16 mg intranasal Narcan  in prehospital setting with response.  Upon arrival, patient hypertensive 169/107.  Afebrile.  No tachycardia or tachypnea.  Saturating 100% on RA.  GCS 14.  Patient somnolent, but arousable.  Moving all extremities.  Responding appropriately.  CN II through XII intact.  Patient placed on end-tidal and SpO2 monitoring.  Labs unremarkable.  CMP without evidence of acute electrolyte disturbance or renal impairment.  Acetaminophen  level undetectable.  EtOH undetectable.  CBC with stable cell lines.  POC glucose 100.  On reassessment, patient alert and oriented.  Awake.  Denying SI and HI at this time.  At this time, patient hemodynamically stable and medically appropriate for discharge.  Reports that she has Narcan  available at home.  All questions answered at bedside.  Final diagnoses:  Accidental overdose of heroin, initial encounter [facility])    ED Discharge Orders     None          Dorcus Elsie, MD 02/02/24 2214    Patt Alm Macho, MD 02/06/24 1630

## 2024-02-04 ENCOUNTER — Emergency Department (HOSPITAL_COMMUNITY)

## 2024-02-04 ENCOUNTER — Other Ambulatory Visit: Payer: Self-pay

## 2024-02-04 ENCOUNTER — Emergency Department (HOSPITAL_COMMUNITY)
Admission: EM | Admit: 2024-02-04 | Discharge: 2024-02-05 | Disposition: A | Discharge status: Home / Self Care | Attending: Emergency Medicine | Admitting: Emergency Medicine

## 2024-02-04 ENCOUNTER — Encounter (HOSPITAL_COMMUNITY): Payer: Self-pay | Admitting: Emergency Medicine

## 2024-02-04 DIAGNOSIS — R1033 Periumbilical pain: Secondary | ICD-10-CM | POA: Diagnosis not present

## 2024-02-04 DIAGNOSIS — W1839XA Other fall on same level, initial encounter: Secondary | ICD-10-CM | POA: Insufficient documentation

## 2024-02-04 DIAGNOSIS — D649 Anemia, unspecified: Secondary | ICD-10-CM | POA: Insufficient documentation

## 2024-02-04 DIAGNOSIS — R079 Chest pain, unspecified: Secondary | ICD-10-CM | POA: Diagnosis not present

## 2024-02-04 DIAGNOSIS — S79911A Unspecified injury of right hip, initial encounter: Secondary | ICD-10-CM | POA: Diagnosis present

## 2024-02-04 DIAGNOSIS — Y9301 Activity, walking, marching and hiking: Secondary | ICD-10-CM | POA: Diagnosis not present

## 2024-02-04 DIAGNOSIS — W19XXXA Unspecified fall, initial encounter: Secondary | ICD-10-CM

## 2024-02-04 DIAGNOSIS — E119 Type 2 diabetes mellitus without complications: Secondary | ICD-10-CM | POA: Diagnosis not present

## 2024-02-04 DIAGNOSIS — S0990XA Unspecified injury of head, initial encounter: Secondary | ICD-10-CM | POA: Diagnosis not present

## 2024-02-04 DIAGNOSIS — S7001XA Contusion of right hip, initial encounter: Secondary | ICD-10-CM | POA: Insufficient documentation

## 2024-02-04 LAB — COMPREHENSIVE METABOLIC PANEL WITH GFR
ALT: 10 U/L (ref 0–44)
AST: 15 U/L (ref 15–41)
Albumin: 4.1 g/dL (ref 3.5–5.0)
Alkaline Phosphatase: 86 U/L (ref 38–126)
Anion gap: 10 (ref 5–15)
BUN: 15 mg/dL (ref 6–20)
CO2: 25 mmol/L (ref 22–32)
Calcium: 9.2 mg/dL (ref 8.9–10.3)
Chloride: 103 mmol/L (ref 98–111)
Creatinine, Ser: 0.77 mg/dL (ref 0.44–1.00)
GFR, Estimated: >60 mL/min (ref >60)
Glucose, Bld: 95 mg/dL (ref 70–99)
Potassium: 3.2 mmol/L — ABNORMAL LOW (ref 3.5–5.1)
Sodium: 139 mmol/L (ref 135–145)
Total Bilirubin: 0.3 mg/dL (ref 0.0–1.2)
Total Protein: 6.8 g/dL (ref 6.5–8.1)

## 2024-02-04 LAB — URINALYSIS, ROUTINE W REFLEX MICROSCOPIC
Bilirubin Urine: NEGATIVE
Glucose, UA: NEGATIVE mg/dL
Hgb urine dipstick: NEGATIVE
Ketones, ur: NEGATIVE mg/dL
Leukocytes,Ua: NEGATIVE
Nitrite: NEGATIVE
Protein, ur: 30 mg/dL — AB
Specific Gravity, Urine: 1.027 (ref 1.005–1.030)
pH: 5 (ref 5.0–8.0)

## 2024-02-04 LAB — CBC WITH DIFFERENTIAL/PLATELET
Abs Immature Granulocytes: 0.01 K/uL (ref 0.00–0.07)
Basophils Absolute: 0 K/uL (ref 0.0–0.1)
Basophils Relative: 1 %
Eosinophils Absolute: 0.1 K/uL (ref 0.0–0.5)
Eosinophils Relative: 2 %
HCT: 23.1 % — ABNORMAL LOW (ref 36.0–46.0)
Hemoglobin: 7.4 g/dL — ABNORMAL LOW (ref 12.0–15.0)
Immature Granulocytes: 0 %
Lymphocytes Relative: 23 %
Lymphs Abs: 1 K/uL (ref 0.7–4.0)
MCH: 28.9 pg (ref 26.0–34.0)
MCHC: 32 g/dL (ref 30.0–36.0)
MCV: 90.2 fL (ref 80.0–100.0)
Monocytes Absolute: 0.4 K/uL (ref 0.1–1.0)
Monocytes Relative: 8 %
Neutro Abs: 2.9 K/uL (ref 1.7–7.7)
Neutrophils Relative %: 66 %
Platelets: 281 K/uL (ref 150–400)
RBC: 2.56 MIL/uL — ABNORMAL LOW (ref 3.87–5.11)
RDW: 14.1 % (ref 11.5–15.5)
WBC: 4.5 K/uL (ref 4.0–10.5)
nRBC: 0 % (ref 0.0–0.2)

## 2024-02-04 LAB — PROTIME-INR
INR: 1 (ref 0.8–1.2)
Prothrombin Time: 13.3 s (ref 11.4–15.2)

## 2024-02-04 LAB — HCG, SERUM, QUALITATIVE: Preg, Serum: NEGATIVE

## 2024-02-04 LAB — CK: Total CK: 129 U/L (ref 38–234)

## 2024-02-04 LAB — MAGNESIUM: Magnesium: 1.9 mg/dL (ref 1.7–2.4)

## 2024-02-04 LAB — CBC
HCT: 21.9 % — ABNORMAL LOW (ref 36.0–46.0)
Hemoglobin: 7 g/dL — ABNORMAL LOW (ref 12.0–15.0)
MCH: 28.9 pg (ref 26.0–34.0)
MCHC: 32 g/dL (ref 30.0–36.0)
MCV: 90.5 fL (ref 80.0–100.0)
Platelets: 258 K/uL (ref 150–400)
RBC: 2.42 MIL/uL — ABNORMAL LOW (ref 3.87–5.11)
RDW: 14 % (ref 11.5–15.5)
WBC: 4.6 K/uL (ref 4.0–10.5)
nRBC: 0 % (ref 0.0–0.2)

## 2024-02-04 LAB — I-STAT CG4 LACTIC ACID, ED: Lactic Acid, Venous: 0.6 mmol/L (ref 0.5–1.9)

## 2024-02-04 MED ORDER — SODIUM CHLORIDE 0.9% IV SOLUTION: Freq: Once | INTRAVENOUS | Status: DC

## 2024-02-04 MED ORDER — FENTANYL CITRATE (PF) 50 MCG/ML IJ SOSY
50.0000 ug | PREFILLED_SYRINGE | Freq: Once | INTRAMUSCULAR | Status: AC
  Administered 2024-02-04: 50 ug via INTRAVENOUS
  Filled 2024-02-04: qty 1

## 2024-02-04 MED ORDER — IOHEXOL 300 MG/ML  SOLN
100.0000 mL | Freq: Once | INTRAMUSCULAR | Status: AC | PRN
  Administered 2024-02-04: 100 mL via INTRAVENOUS

## 2024-02-04 MED ORDER — LACTATED RINGERS IV BOLUS
1000.0000 mL | Freq: Once | INTRAVENOUS | Status: AC
  Administered 2024-02-04: 1000 mL via INTRAVENOUS

## 2024-02-04 NOTE — ED Notes (Signed)
 Unsuccessful IV attempt x2.

## 2024-02-04 NOTE — ED Triage Notes (Signed)
 BIBA from home- [place] this morning around 530 no LOC, no head strike.Pt has right hip pain and deformity- has been walking around, refused assistance from EMS and instead walked down 3 flights of stairs. Also has lower abdominal pain, right elbow, wrist and neck pain. Pt states she did drink some whiskey after falling.

## 2024-02-04 NOTE — ED Provider Notes (Signed)
 [place] EMERGENCY DEPARTMENT AT [facility] Provider Note   CSN: [id number] Arrival date & time: 02/04/24  1659     Patient presents with: Fall and Hip Pain   Angie [name] is a 58 y.o. female.   HPI Patient presents after fall.  Medical history includes schizophrenia, depression, DM, arthritis, anemia, alcohol abuse, chronic pain, prior gastric bypass surgery, seizures, migraines, anxiety.  She was seen in the ED 2 days ago for accidental narcotic overdose.  This morning at 5:30 AM, she had an fall.  At the time, she was walking around her car to get to the trunk.  As another vehicle came towards her vehicle, she rest to close the door.  When she did show, she lost her balance.  She landed on her right side.  She denies LOC or striking her head.  She has since had pain in her right hip.  Despite this, she has continued to ambulate.  She also endorses pain in right wrist, elbow, neck, as well as periumbilical area of abdomen.    Prior to Admission medications   Medication Sig Start Date End Date Taking? Authorizing Provider  DULoxetine  (CYMBALTA ) 30 MG capsule Take 1 capsule (30 mg total) by mouth 2 (two) times daily. 12/13/23   Donnelly Mellow, MD  lidocaine  (XYLOCAINE ) 5 % ointment Apply 1 Application topically 2 (two) times daily as needed (For pain).    [provider]  naloxone  (NARCAN ) nasal spray 4 mg/0.1 mL Place 1 spray into the nose Once PRN (For opioid overdose). Patient not taking: Reported on 12/07/2023 06/11/20   [provider]  oxymetazoline  (VICKS SINEX SEVERE) 0.05 % nasal spray Place 1-2 sprays into both nostrils daily.    [provider]    Allergies: Iron , Lactose intolerance (gi), and Other    Review of Systems  Gastrointestinal:  Positive for abdominal pain.  Musculoskeletal:  Positive for arthralgias, joint swelling and neck pain.  All other systems reviewed and are negative.   Updated Vital Signs BP (!) 125/101 (BP  Location: Right Arm)   Pulse 68   Temp (!) 97.5 F (36.4 C) (Oral)   Resp 15   Ht 5' (1.524 m)   Wt 52.2 kg   SpO2 100%   BMI 22.46 kg/m   Physical Exam Vitals and nursing note reviewed.  Constitutional:      General: She is not in acute distress.    Appearance: Normal appearance. She is well-developed and underweight. She is not ill-appearing, toxic-appearing or diaphoretic.  HENT:     Head: Normocephalic and atraumatic.     Right Ear: External ear normal.     Left Ear: External ear normal.     Nose: Nose normal.     Mouth/Throat:     Mouth: Mucous membranes are moist.  Eyes:     Extraocular Movements: Extraocular movements intact.     Conjunctiva/sclera: Conjunctivae normal.  Neck:     Comments: Cervical collar in place Cardiovascular:     Rate and Rhythm: Normal rate and regular rhythm.  Pulmonary:     Effort: Pulmonary effort is normal. No respiratory distress.     Breath sounds: No wheezing or rales.  Chest:     Chest wall: No tenderness.  Abdominal:     General: There is no distension.     Palpations: Abdomen is soft.     Tenderness: There is abdominal tenderness. There is no guarding or rebound.  Musculoskeletal:  General: Swelling, tenderness and signs of injury present.     Cervical back: Neck supple.  Skin:    General: Skin is warm and dry.     Findings: Bruising present.  Neurological:     General: No focal deficit present.     Mental Status: She is alert and oriented to person, place, and time.  Psychiatric:        Mood and Affect: Mood normal.        Behavior: Behavior normal.     (all labs ordered are listed, but only abnormal results are displayed) Labs Reviewed  COMPREHENSIVE METABOLIC PANEL WITH GFR - Abnormal; Notable for the following components:      Result Value   Potassium 3.2 (*)    All other components within normal limits  URINALYSIS, ROUTINE W REFLEX MICROSCOPIC - Abnormal; Notable for the following components:   APPearance  HAZY (*)    Protein, ur 30 (*)    Bacteria, UA RARE (*)    All other components within normal limits  CBC WITH DIFFERENTIAL/PLATELET - Abnormal; Notable for the following components:   RBC 2.56 (*)    Hemoglobin 7.4 (*)    HCT 23.1 (*)    All other components within normal limits  CBC - Abnormal; Notable for the following components:   RBC 2.42 (*)    Hemoglobin 7.0 (*)    HCT 21.9 (*)    All other components within normal limits  PROTIME-INR  CK  MAGNESIUM   HCG, SERUM, QUALITATIVE  ETHANOL  I-STAT CG4 LACTIC ACID, ED  I-STAT CHEM 8, ED  TYPE AND SCREEN  PREPARE RBC (CROSSMATCH)    EKG: EKG Interpretation Date/Time:  Saturday February 04 2024 18:31:43 EDT Ventricular Rate:  64 PR Interval:  139 QRS Duration:  104 QT Interval:  419 QTC Calculation: 433 R Axis:   4  Text Interpretation: Sinus rhythm Confirmed by Melvenia Motto ([id number]) on 02/04/2024 6:59:18 PM  Radiology: CT CHEST ABDOMEN PELVIS W CONTRAST Result Date: 02/04/2024 CLINICAL DATA:  Fall this morning with chest and abdominal pain, initial encounter EXAM: CT CHEST, ABDOMEN, AND PELVIS WITH CONTRAST TECHNIQUE: Multidetector CT imaging of the chest, abdomen and pelvis was performed following the standard protocol during bolus administration of intravenous contrast. RADIATION DOSE REDUCTION: This exam was performed according to the departmental dose-optimization program which includes automated exposure control, adjustment of the mA and/or kV according to patient size and/or use of iterative reconstruction technique. CONTRAST:  100mL OMNIPAQUE  IOHEXOL  300 MG/ML  SOLN COMPARISON:  None Available. FINDINGS: CT CHEST FINDINGS Cardiovascular: Thoracic aorta and its branches are within normal limits. Mild coronary calcifications are seen. No aneurysmal dilatation is seen. Pulmonary artery as visualized is within normal limits. No cardiac enlargement is noted. Mediastinum/Nodes: Thoracic inlet is within normal limits. No hilar or  mediastinal adenopathy is noted. The esophagus as visualized is within normal limits. Lungs/Pleura: Lungs are well aerated bilaterally. No focal infiltrate or sizable effusion is seen. No bony abnormality is noted. Musculoskeletal: Degenerative changes of the thoracic spine are noted. No acute rib abnormality is seen. CT ABDOMEN PELVIS FINDINGS Hepatobiliary: Liver is within normal limits. Gallbladder is distended with dependent density consistent with gallbladder sludge stable from prior MRI. Prominence of the common bile duct is noted which appears stable from prior exam measuring up to 9 mm. No choledocholithiasis is seen. No pancreatic mass is noted. Pancreas: Unremarkable. No pancreatic ductal dilatation or surrounding inflammatory changes. Spleen: Normal in size without focal abnormality. Adrenals/Urinary Tract: Adrenal  glands are within normal limits. Kidneys are well visualized bilaterally. No renal calculi or obstructive changes are seen. Left renal cyst is noted. No follow-up is recommended. The bladder is well distended. Stomach/Bowel: No obstructive or inflammatory changes of colon are seen. Mild retained fecal material is noted without obstructive change. The appendix is within normal limits. Small bowel is unremarkable. Postsurgical changes in the stomach are noted. Vascular/Lymphatic: No significant vascular findings are present. No enlarged abdominal or pelvic lymph nodes. Reproductive: Uterus and bilateral adnexa are unremarkable. Other: No abdominal wall hernia or abnormality. No abdominopelvic ascites. Musculoskeletal: No acute or significant osseous findings. IMPRESSION: CT of the chest: No acute abnormality noted. CT of the abdomen and pelvis: Gallbladder sludge. Stable dilated common bile duct without distal obstructing lesion. This is stable from 2021. No acute abnormality noted. Electronically Signed   By: Oneil Devonshire M.D.   On: 02/04/2024 20:58   CT HEAD WO CONTRAST Result Date:  02/04/2024 CLINICAL DATA:  Head trauma, moderate-severe.  Fall. EXAM: CT HEAD WITHOUT CONTRAST TECHNIQUE: Contiguous axial images were obtained from the base of the skull through the vertex without intravenous contrast. RADIATION DOSE REDUCTION: This exam was performed according to the departmental dose-optimization program which includes automated exposure control, adjustment of the mA and/or kV according to patient size and/or use of iterative reconstruction technique. COMPARISON:  08/15/2019 FINDINGS: Brain: No acute intracranial abnormality. Specifically, no hemorrhage, hydrocephalus, mass lesion, acute infarction, or significant intracranial injury. Vascular: No hyperdense vessel or unexpected calcification. Skull: No acute calvarial abnormality. Sinuses/Orbits: No acute findings Other: None IMPRESSION: Normal study. Electronically Signed   By: Franky Crease M.D.   On: 02/04/2024 20:58   CT CERVICAL SPINE WO CONTRAST Result Date: 02/04/2024 CLINICAL DATA:  Polytrauma, blunt.  Fall. EXAM: CT CERVICAL SPINE WITHOUT CONTRAST TECHNIQUE: Multidetector CT imaging of the cervical spine was performed without intravenous contrast. Multiplanar CT image reconstructions were also generated. RADIATION DOSE REDUCTION: This exam was performed according to the departmental dose-optimization program which includes automated exposure control, adjustment of the mA and/or kV according to patient size and/or use of iterative reconstruction technique. COMPARISON:  None Available. FINDINGS: Alignment: Normal Skull base and vertebrae: No acute fracture. No primary bone lesion or focal pathologic process. Soft tissues and spinal canal: No prevertebral fluid or swelling. No visible canal hematoma. Disc levels:  Negative Upper chest: No acute findings Other: None IMPRESSION: No acute bony abnormality. Electronically Signed   By: Franky Crease M.D.   On: 02/04/2024 20:57   DG Wrist Complete Right Result Date: 02/04/2024 CLINICAL  DATA:  Status post fall. EXAM: RIGHT WRIST - COMPLETE 3+ VIEW COMPARISON:  None Available. FINDINGS: There is no evidence of fracture or dislocation. There is no evidence of arthropathy or other focal bone abnormality. Soft tissues are unremarkable. IMPRESSION: Negative. Electronically Signed   By: Suzen Dials M.D.   On: 02/04/2024 18:15   DG Hip Unilat W or Wo Pelvis 2-3 Views Right Result Date: 02/04/2024 CLINICAL DATA:  Status post trauma. EXAM: DG HIP (WITH OR WITHOUT PELVIS) 2-3V RIGHT COMPARISON:  None Available. FINDINGS: There is no evidence of hip fracture or dislocation. There is no evidence of arthropathy or other focal bone abnormality. Moderate severity soft tissue swelling is seen along the lateral aspect of the right hip. IMPRESSION: Moderate severity lateral right hip soft tissue swelling without evidence of acute fracture or dislocation. Electronically Signed   By: Suzen Dials M.D.   On: 02/04/2024 18:14   DG Elbow  Complete Right Result Date: 02/04/2024 CLINICAL DATA:  Status post trauma. EXAM: RIGHT ELBOW - COMPLETE 3+ VIEW COMPARISON:  None Available. FINDINGS: There is no evidence of fracture, dislocation, or joint effusion. There is no evidence of arthropathy or other focal bone abnormality. Soft tissues are unremarkable. IMPRESSION: Negative. Electronically Signed   By: Suzen Dials M.D.   On: 02/04/2024 18:13     Procedures   Medications Ordered in the ED  fentaNYL  (SUBLIMAZE ) injection 50 mcg (has no administration in time range)  0.9 %  sodium chloride  infusion (Manually program via Guardrails IV Fluids) (has no administration in time range)  lactated ringers  bolus 1,000 mL (0 mLs Intravenous Stopped 02/04/24 2228)  fentaNYL  (SUBLIMAZE ) injection 50 mcg (50 mcg Intravenous Given 02/04/24 2004)  iohexol  (OMNIPAQUE ) 300 MG/ML solution 100 mL (100 mLs Intravenous Contrast Given 02/04/24 2037)                                    Medical Decision  Making Amount and/or Complexity of Data Reviewed Labs: ordered. Radiology: ordered.  Risk Prescription drug management.   This patient presents to the ED for concern of fall, this involves an extensive number of treatment options, and is a complaint that carries with it a high risk of complications and morbidity.  The differential diagnosis includes acute injury   Co morbidities / Chronic conditions that complicate the patient evaluation  schizophrenia, depression, DM, arthritis, anemia, alcohol abuse, chronic pain, prior gastric bypass surgery, seizures, migraines, anxiety   Additional history obtained:  Additional history obtained from EMR External records from outside source obtained and reviewed including N/A   Lab Tests:  I Ordered, and personally interpreted labs.  The pertinent results include: Acute on chronic anemia, no leukocytosis, hypokalemia with otherwise normal electrolytes   Imaging Studies ordered:  I ordered imaging studies including x-ray of right elbow, hip, wrist; CT of head, cervical spine, chest, abdomen, pelvis I independently visualized and interpreted imaging which showed right hip hematoma with no other acute findings I agree with the radiologist interpretation   Cardiac Monitoring: / EKG:  The patient was maintained on a cardiac monitor.  I personally viewed and interpreted the cardiac monitored which showed an underlying rhythm of: Sinus rhythm   Problem List / ED Course / Critical interventions / Medication management  Patient presenting after mechanical fall that occurred earlier this morning.  On arrival in the ED, she is alert and oriented.  She arrives with cervical collar in place due to right-sided neck pain.  She endorses abdominal pain and does have some tenderness present on exam.  She has pain in her right elbow and wrist, however, range of motion is preserved and no swelling is appreciated.  She does have significant swelling and  bruising to area of right hip.  I do not appreciate right leg shortening.  Dose of Percocet was ordered for analgesia.  Workup was initiated.  Lab work notable for slight worsening of anemia.  She is close to transfusion threshold.  Patient underwent imaging studies which showed hematoma without any other acute findings.  Repeat CBC showed hemoglobin of 7.0.  Patient was agreeable to blood transfusion.  Patient is comfortable with plan for discharge home following transfusion.  Care of patient signed out to oncoming ED provider. I ordered medication including IV fluid for hydration, fentanyl  for analgesia, PRBCs for anemia. Reevaluation of the patient after these medicines showed  that the patient improved I have reviewed the patients home medicines and have made adjustments as needed  Social Determinants of Health:  Lives at home with husband     Final diagnoses:  Fall, initial encounter  Hematoma of right hip, initial encounter  Low hemoglobin    ED Discharge Orders     None          Melvenia Motto, MD 02/04/24 2326

## 2024-02-04 NOTE — Discharge Instructions (Addendum)
 Your imaging studies showed no major injuries.  The swelling on the right hip should improve over time.  You might notice spreading of bruise in this area.  This is expected.  Take home pain medicine as needed for pain and soreness.  Return to the emergency department for any new or worsening symptoms of concern.

## 2024-02-05 DIAGNOSIS — S7001XA Contusion of right hip, initial encounter: Secondary | ICD-10-CM | POA: Diagnosis not present

## 2024-02-05 LAB — PREPARE RBC (CROSSMATCH)

## 2024-02-05 MED ORDER — OXYCODONE HCL 5 MG PO TABS
5.0000 mg | ORAL_TABLET | Freq: Once | ORAL | Status: AC
  Administered 2024-02-05: 5 mg via ORAL
  Filled 2024-02-05: qty 1

## 2024-02-05 NOTE — ED Notes (Signed)
 Patient placed on bedpan.

## 2024-02-05 NOTE — ED Notes (Signed)
 Patient up and walked to bathroom with one person assistance,

## 2024-02-06 LAB — TYPE AND SCREEN
ABO/RH(D): B POS
Antibody Screen: NEGATIVE
Unit division: 0

## 2024-02-06 LAB — BPAM RBC
Blood Product Expiration Date: 202511052359
ISSUE DATE / TIME: 202510120123
Unit Type and Rh: 7300

## 2024-02-17 ENCOUNTER — Emergency Department (HOSPITAL_COMMUNITY): Admission: EM | Admit: 2024-02-17 | Discharge: 2024-02-17 | Disposition: A | Discharge status: Home / Self Care

## 2024-02-17 DIAGNOSIS — M25551 Pain in right hip: Secondary | ICD-10-CM | POA: Diagnosis present

## 2024-02-17 DIAGNOSIS — X58XXXA Exposure to other specified factors, initial encounter: Secondary | ICD-10-CM | POA: Diagnosis not present

## 2024-02-17 DIAGNOSIS — T148XXA Other injury of unspecified body region, initial encounter: Secondary | ICD-10-CM

## 2024-02-17 DIAGNOSIS — S7001XA Contusion of right hip, initial encounter: Secondary | ICD-10-CM | POA: Diagnosis not present

## 2024-02-17 LAB — CBC WITH DIFFERENTIAL/PLATELET
Abs Immature Granulocytes: 0.01 K/uL (ref 0.00–0.07)
Basophils Absolute: 0 K/uL (ref 0.0–0.1)
Basophils Relative: 1 %
Eosinophils Absolute: 0.2 K/uL (ref 0.0–0.5)
Eosinophils Relative: 3 %
HCT: 25.8 % — ABNORMAL LOW (ref 36.0–46.0)
Hemoglobin: 8.3 g/dL — ABNORMAL LOW (ref 12.0–15.0)
Immature Granulocytes: 0 %
Lymphocytes Relative: 18 %
Lymphs Abs: 1.2 K/uL (ref 0.7–4.0)
MCH: 28.9 pg (ref 26.0–34.0)
MCHC: 32.2 g/dL (ref 30.0–36.0)
MCV: 89.9 fL (ref 80.0–100.0)
Monocytes Absolute: 0.7 K/uL (ref 0.1–1.0)
Monocytes Relative: 10 %
Neutro Abs: 4.5 K/uL (ref 1.7–7.7)
Neutrophils Relative %: 68 %
Platelets: 242 K/uL (ref 150–400)
RBC: 2.87 MIL/uL — ABNORMAL LOW (ref 3.87–5.11)
RDW: 13.4 % (ref 11.5–15.5)
WBC: 6.6 K/uL (ref 4.0–10.5)
nRBC: 0 % (ref 0.0–0.2)

## 2024-02-17 LAB — BASIC METABOLIC PANEL WITH GFR
Anion gap: 9 (ref 5–15)
BUN: 11 mg/dL (ref 6–20)
CO2: 23 mmol/L (ref 22–32)
Calcium: 8.9 mg/dL (ref 8.9–10.3)
Chloride: 103 mmol/L (ref 98–111)
Creatinine, Ser: 0.78 mg/dL (ref 0.44–1.00)
GFR, Estimated: >60 mL/min (ref >60)
Glucose, Bld: 98 mg/dL (ref 70–99)
Potassium: 3.8 mmol/L (ref 3.5–5.1)
Sodium: 136 mmol/L (ref 135–145)

## 2024-02-17 MED ORDER — OXYCODONE-ACETAMINOPHEN 5-325 MG PO TABS
1.0000 | ORAL_TABLET | Freq: Once | ORAL | Status: AC
  Administered 2024-02-17: 1 via ORAL
  Filled 2024-02-17: qty 1

## 2024-02-17 NOTE — ED Provider Notes (Signed)
 [place] EMERGENCY DEPARTMENT AT [facility] Provider Note   CSN: [id number] Arrival date & time: 02/17/24  1451     Patient presents with: Hip Pain   Angie [name] is a 58 y.o. female.   58 year old female presents for evaluation of right hip pain.  She has a history of cellulitis for which she is currently being treated for.  States she had a fall and obtained a hematoma of her hip.  States the cellulitic area has become more red over the last few days as well and she was concerned she was getting another infection.  She denies any fevers, chills, or any other symptoms or concerns at this time.   Hip Pain Pertinent negatives include no chest pain, no abdominal pain and no shortness of breath.       Prior to Admission medications   Medication Sig Start Date End Date Taking? Authorizing Provider  DULoxetine  (CYMBALTA ) 30 MG capsule Take 1 capsule (30 mg total) by mouth 2 (two) times daily. 12/13/23   Donnelly Mellow, MD  lidocaine  (XYLOCAINE ) 5 % ointment Apply 1 Application topically 2 (two) times daily as needed (For pain).    [provider]  naloxone  (NARCAN ) nasal spray 4 mg/0.1 mL Place 1 spray into the nose Once PRN (For opioid overdose). Patient not taking: Reported on 12/07/2023 06/11/20   [provider]  oxymetazoline  (VICKS SINEX SEVERE) 0.05 % nasal spray Place 1-2 sprays into both nostrils daily.    [provider]    Allergies: Iron , Lactose intolerance (gi), and Other    Review of Systems  Constitutional:  Negative for chills and fever.  HENT:  Negative for ear pain and sore throat.   Eyes:  Negative for pain and visual disturbance.  Respiratory:  Negative for cough and shortness of breath.   Cardiovascular:  Negative for chest pain and palpitations.  Gastrointestinal:  Negative for abdominal pain and vomiting.  Genitourinary:  Negative for dysuria and hematuria.  Musculoskeletal:  Negative for arthralgias and back  pain.       Admits right hip pain  Skin:  Negative for color change and rash.  Neurological:  Negative for seizures and syncope.  All other systems reviewed and are negative.   Updated Vital Signs BP 115/69   Pulse 68   Temp 98.5 F (36.9 C) (Oral)   Resp 17   SpO2 100%   Physical Exam Vitals and nursing note reviewed.  Constitutional:      General: She is not in acute distress.    Appearance: She is well-developed.  HENT:     Head: Normocephalic and atraumatic.  Eyes:     Conjunctiva/sclera: Conjunctivae normal.  Cardiovascular:     Rate and Rhythm: Normal rate and regular rhythm.     Heart sounds: No murmur heard. Pulmonary:     Effort: Pulmonary effort is normal. No respiratory distress.     Breath sounds: Normal breath sounds.  Abdominal:     Palpations: Abdomen is soft.     Tenderness: There is no abdominal tenderness.  Musculoskeletal:        General: No swelling.     Cervical back: Neck supple.     Comments: Large healing hematoma to right hip with some redness to the outside of the right tibia where patient states she has chronic cellulitis.  There is some streaking up the legs but this appears to be ecchymosis and like of the resolution of the hematoma more so than cellulitis  Skin:    General: Skin is warm and dry.     Capillary Refill: Capillary refill takes less than 2 seconds.  Neurological:     Mental Status: She is alert.  Psychiatric:        Mood and Affect: Mood normal.     (all labs ordered are listed, but only abnormal results are displayed) Labs Reviewed  CBC WITH DIFFERENTIAL/PLATELET - Abnormal; Notable for the following components:      Result Value   RBC 2.87 (*)    Hemoglobin 8.3 (*)    HCT 25.8 (*)    All other components within normal limits  BASIC METABOLIC PANEL WITH GFR    EKG: None  Radiology: No results found.   Procedures   Medications Ordered in the ED  oxyCODONE -acetaminophen  (PERCOCET/ROXICET) 5-325 MG per tablet  1 tablet (1 tablet Oral Given 02/17/24 1730)                                    Medical Decision Making Patient here for hematoma in the setting of chronic cellulitis.  Lab workup is unremarkable.  I think likely she does have a heat hematoma that is starting to heal and due to gravity worsening more redness down her leg.  Leg does not look to be acutely cellulitic.  She has been taking antibiotics anyways.  Vitals are stable.  Advise close up with primary care otherwise return to the ER for any worsening symptoms.  Advised to send her medications as prescribed.  She feels comfortable with the plan be discharged home.  Problems Addressed: Hematoma: chronic illness or injury Right hip pain: chronic illness or injury  Amount and/or Complexity of Data Reviewed External Data Reviewed: notes.    Details: Prior outpatient records reviewed and patient be treated for cellulitis with Bactrim and Keflex  at this time Labs: ordered. Decision-making details documented in ED Course.    Details: Ordered and reviewed by me and unremarkable  Risk OTC drugs. Prescription drug management.     Final diagnoses:  Right hip pain  Hematoma    ED Discharge Orders     None          Gennaro Duwaine CROME, DO 02/17/24 2238

## 2024-02-17 NOTE — Discharge Instructions (Signed)
 Take your medications as prescribed.  Follow-up with your primary care doctor in 1 to 2 weeks.

## 2024-02-17 NOTE — ED Triage Notes (Signed)
 Pt BIB [facility] with report of multiple falls x 2 weeks, chronic cellulitis, and bump on her hip. Pt fell today and hit her head. No thinners.
